# Patient Record
Sex: Female | Born: 1945 | Race: White | Hispanic: No | Marital: Married | Smoking: Former smoker
Health system: Southern US, Community
[De-identification: ages and names within clinical notes are randomized; demographics above are authoritative.]

## PROBLEM LIST (undated history)

## (undated) DIAGNOSIS — F419 Anxiety disorder, unspecified: Secondary | ICD-10-CM

## (undated) DIAGNOSIS — F329 Major depressive disorder, single episode, unspecified: Secondary | ICD-10-CM

## (undated) DIAGNOSIS — M1990 Unspecified osteoarthritis, unspecified site: Secondary | ICD-10-CM

## (undated) DIAGNOSIS — E079 Disorder of thyroid, unspecified: Secondary | ICD-10-CM

## (undated) DIAGNOSIS — C801 Malignant (primary) neoplasm, unspecified: Secondary | ICD-10-CM

## (undated) DIAGNOSIS — I10 Essential (primary) hypertension: Secondary | ICD-10-CM

## (undated) DIAGNOSIS — G459 Transient cerebral ischemic attack, unspecified: Secondary | ICD-10-CM

## (undated) DIAGNOSIS — F32A Depression, unspecified: Secondary | ICD-10-CM

## (undated) DIAGNOSIS — E119 Type 2 diabetes mellitus without complications: Secondary | ICD-10-CM

## (undated) DIAGNOSIS — E785 Hyperlipidemia, unspecified: Secondary | ICD-10-CM

## (undated) HISTORY — PX: ABDOMINAL HYSTERECTOMY: SHX81

## (undated) HISTORY — DX: Type 2 diabetes mellitus without complications: E11.9

## (undated) HISTORY — PX: TUBAL LIGATION: SHX77

## (undated) HISTORY — PX: BREAST BIOPSY: SHX20

---

## 1998-04-14 ENCOUNTER — Encounter: Payer: Self-pay | Admitting: Nephrology

## 1998-04-14 ENCOUNTER — Ambulatory Visit (HOSPITAL_COMMUNITY): Admission: RE | Admit: 1998-04-14 | Discharge: 1998-04-14 | Payer: Self-pay | Admitting: Nephrology

## 1998-05-17 ENCOUNTER — Other Ambulatory Visit: Admission: RE | Admit: 1998-05-17 | Discharge: 1998-05-17 | Payer: Self-pay | Admitting: Family Medicine

## 1999-05-31 ENCOUNTER — Ambulatory Visit (HOSPITAL_COMMUNITY): Admission: RE | Admit: 1999-05-31 | Discharge: 1999-05-31 | Payer: Self-pay | Admitting: Gastroenterology

## 1999-09-28 ENCOUNTER — Other Ambulatory Visit: Admission: RE | Admit: 1999-09-28 | Discharge: 1999-09-28 | Payer: Self-pay | Admitting: Family Medicine

## 2003-01-09 ENCOUNTER — Inpatient Hospital Stay (HOSPITAL_COMMUNITY): Admission: EM | Admit: 2003-01-09 | Discharge: 2003-01-11 | Payer: Self-pay

## 2003-01-09 ENCOUNTER — Encounter: Payer: Self-pay | Admitting: Emergency Medicine

## 2004-07-31 ENCOUNTER — Other Ambulatory Visit: Admission: RE | Admit: 2004-07-31 | Discharge: 2004-07-31 | Payer: Self-pay | Admitting: Family Medicine

## 2007-09-21 ENCOUNTER — Other Ambulatory Visit: Admission: RE | Admit: 2007-09-21 | Discharge: 2007-09-21 | Payer: Self-pay | Admitting: Family Medicine

## 2010-08-03 NOTE — Discharge Summary (Signed)
NAME:  Toni, [name]                       ACCOUNT NO.:  [id number]   MEDICAL RECORD NO.:  [id number]                   PATIENT TYPE:  INP   LOCATION:  4735                                 FACILITY:  [facility]   PHYSICIAN:  Sherin Quarry, MD                   DATE OF BIRTH:  Jan 13, 1946   DATE OF ADMISSION:  01/09/2003  DATE OF DISCHARGE:  01/11/2003                                 DISCHARGE SUMMARY   [name] is a 65 year old lady who initially presented to [facility]  Emergency Room on October 24 with a 24-hour history of recurrent chest pain  described as a raw burning feeling which seemed to radiate to the left arm.  There was no associated dyspnea, diaphoresis, or nausea.  Symptoms were  partially relieved by Maalox.  The patient has a two-year history of  hypertension.  Also of note is that the patient's brother and sister both  had coronary artery disease and were status post CABG.   ADMISSION PHYSICAL EXAMINATION:  VITAL SIGNS:  Blood pressure 139/83, pulse  83, respirations 18.  HEENT:  Within normal limits.  CHEST:  Clear.  CARDIOVASCULAR:  Normal S1 and S2 without rubs, murmurs, or gallops.  ABDOMEN:  Benign.  Normal bowel sounds without masses, tenderness, or  organomegaly.  NEUROLOGIC:  Normal.  EXTREMITIES:  Normal.   HOSPITAL COURSE:  Relevant laboratory studies obtained included white count  8600, hemoglobin 15.2. Differential was normal.  INR 0.9.  Sodium 136,  potassium 3.4, creatinine 0.7, BUN 111.  AST 53, ALT 46.  Serial cardiac  enzymes were negative.  Of note was that the LDL cholesterol was 157, HDL  56, triglycerides 250.  Also of note was that the TSH was 29.39.  At the  time of admission, the patient indicated that she was taking Synthroid 150  mcg daily.   Consultation was obtained from Dr. Fraser Din of the [facility] Cardiology Service.  A stress Cardiolite study was done.  No ischemic changes were identified.  Ejection fraction was felt to be  normal.  Dr. Fraser Din felt this was a normal  study.   On October 26, the patient was discharged.   DISCHARGE DIAGNOSES:  1. Chest pain, not of cardiac origin.  2. Hypertension.  3. Hyperlipidemia.  4. Depression.  5. Hypothyroidism, suspect noncompliance with thyroid medication.  6. Family history of coronary artery disease.  7. History of gastroesophageal reflux disease.   DISCHARGE MEDICATIONS:  1. Synthroid 150 mcg daily.  The patient was encouraged to take this     medication on empty stomach in the morning and to be sure to take it on a     regular basis.  2. She will also continue Prozac 20 mg daily.  3. Wellbutrin 150 mg daily.  4. Prilosec p.r.n.  5. Lipitor 10 mg daily.  6. Chlorthalidone 15 mg daily.   FOLLOW UP:  Robert L. Foy Guadalajara,  M.D. who will continue to follow the patient on  a regular basis.   CONDITION ON DISCHARGE:  Good.                                                Sherin Quarry, MD    SY/MEDQ  D:  02/02/2003  T:  02/03/2003  Job:  [id number]   cc:   Molly Maduro L. Foy Guadalajara, M.D.  [address] [place]  [place] [address]  Fax: [phone]

## 2010-08-03 NOTE — Consult Note (Signed)
NAME:  [name], [name]                       ACCOUNT NO.:  [id number]   MEDICAL RECORD NO.:  [id number]                   PATIENT TYPE:  INP   LOCATION:  4735                                 FACILITY:  [facility]   PHYSICIAN:  Meade Maw, M.D.                 DATE OF BIRTH:  [birth year]   DATE OF CONSULTATION:  DATE OF DISCHARGE:                                   CONSULTATION   REFERRING PHYSICIAN:  Robert L. Foy Guadalajara, M.D.   INDICATION FOR CONSULT:  Chest pain.   HISTORY:  [name] is a pleasant 65 year old female who was admitted on  October 24 with a two day history of chest pain.  The pain was described as  substernal and radiated towards her left arm.  She denies shortness of  breath, nausea, and diaphoresis.  The pain began soon after going to bed  approximately two days ago.  It resolved throughout the night.  She slept  without difficulty.  She awoke the next morning with chest pain.  The chest  pain is not aggravated by activity, actually gets better, but continues to  persist as a vague sort of pain.  The pain also radiates into her left neck.  She presented to the emergency room and was pain-free after her second  sublingual nitroglycerin.  She does note that she has been having frequent  episodes of indigestion.  She has recently been started on Lipitor for  dyslipidemia.   PAST MEDICAL HISTORY:  1. Hypertension.  2. Dyslipidemia.  3. Hypothyroidism.  4. GERD.  5. Depression.   PAST SURGICAL HISTORY:  Total abdominal hysterectomy.   MEDICATIONS:  1. Synthroid 150 mcg daily.  2. Wellbutrin 150 mg daily.  3. Lipitor 10 mg daily.  4. Maalox p.r.n.  5. Prozac 20 mg daily.  6. Prilosec p.r.n.  7. Chlorthalidone 15 mg daily.   ALLERGIES:  She has no known drug allergies.   FAMILY HISTORY:  Father passed at age 25 from COPD.  Brother is alive at 39.  He does have a CABG.  Mother passed at 2.  She suffered from Alzheimer's  before death.   SOCIAL HISTORY:  She  has a remote history of tobacco use.  Stopped smoking  15 years ago.  Has two or three glasses of wine per day.   REVIEW OF SYSTEMS:  There is no shortness of breath, PND, orthopnea.  No  numbness.  No syncope.  No melena.   PHYSICAL EXAMINATION:  GENERAL:  Middle-aged female in no acute distress.  VITAL SIGNS:  Blood pressure 122/79, heart rate 70, respiratory rate 20, O2  saturation 99%.  HEENT:  Unremarkable.  PULMONARY:  Breath sounds are equal and clear to auscultation.  CARDIOVASCULAR:  Regular rate and rhythm.  ABDOMEN:  Soft, nontender.  No unusual bruits or pulsations are noted.  EXTREMITIES:  Distal pulses are bilateral.  NEUROLOGIC:  Nonfocal.  SKIN:  Warm  and dry.   LABORATORY DATA:  ECG reveals a normal sinus rhythm.  There is T-wave  inversion in aVL and V2 only.  Chest x-ray reveals no acute disease.  White  count 8.6, hematocrit 43, platelet count 213,000.  Potassium 3.6, creatinine  1.1, glucose 111.  LFTs are normal.  INR 0.9.  Troponin I serials have been  negative.  Serum CKs are pending.  Serum myoglobins are normal.   IMPRESSION:  98. A 65 year old female with risk factors including remote history of     tobacco use, family history, dyslipidemia.  Chest pain is somewhat     atypical, however, for cardiac.  Will schedule for a stress Cardiolite.     Of note, the patient has had a recent stress Cardiolite within the past     five years which was negative as well.  She will continue with aspirin,     Lovenox, and her beta blockers.  2. Hypertension.  Blood pressure is well controlled.  3. Dyslipidemia.  Initial set of liver enzymes are documented as abnormal.     Second set was normal.  Will repeat to verify.  4. Gastroesophageal reflux disease.  Will continue to PPI.  This may be     aggravated by her relatively large consumption of alcohol.                                               Meade Maw, M.D.    HP/MEDQ  D:  01/10/2003  T:  01/11/2003  Job:   [id number]

## 2010-08-03 NOTE — H&P (Signed)
NAME:  [name], BRIDGE                       ACCOUNT NO.:  [id number]   MEDICAL RECORD NO.:  [id number]                   PATIENT TYPE:  EMS   LOCATION:  MAJO                                 FACILITY:  [facility]   PHYSICIAN:  Sherin Quarry, MD                   DATE OF BIRTH:  [birth year]   DATE OF ADMISSION:  01/09/2003  DATE OF DISCHARGE:                                HISTORY & PHYSICAL   HISTORY OF PRESENT ILLNESS:  The patient is a 65 year old lady who presents  to [facility] emergency room on October 24 with a 24-hour history of  recurrent chest pain.  This began about 7:30 Saturday night, and was  described as  raw or aching feeling in the substernal area which seemed to  radiate across the chest to the proximal portion of the left arm.  It was  not associated with any shortness of breath, diaphoresis or nausea.  The  patient initially took two doses of Maalox with partial relief, and she  managed to fall asleep, although she says she slept somewhat erratically  during the night.  Today, the pain has persisted.  It is not relieved by  antacids.  Therefore, the patient ultimately presented to the emergency  room.   In the emergency room, she was given two sublingual nitroglycerin, again  with partial relief of symptoms which was not clearly related to the  nitroglycerin.  Of note is that the patient has a two-year history of  hypertension for which she is currently taking thiazide diuretic.  Also, she  was recently put on Lipitor for hyperlipidemia.  Both her brother and sister  have coronary artery disease and are status post coronary artery bypass  graft.   PAST MEDICAL HISTORY:   MEDICATIONS:  1. Synthroid 150 micrograms daily.  2. Prozac 20 mg daily.  3. Wellbutrin XL, 150 mg daily.  4. Prilosec over the counter p.r.n.  5. Lipitor 10 mg daily.  6. Chlorthalidone 15 mg daily.   ILLNESSES:  The patient states that she has chronic indigestion which is  attributed to  gastroesophageal reflux.  She had an upper endoscopy about  four years ago, but is not too clear about the results.  She has a  colonoscopy done two years ago which was negative.  The patient uses Maalox  and Prilosec on a p.r.n. basis.  Also of note is the patient indicates that  she was hospitalized for chest pain about five years ago, and had a stress  test at that time which was normal.   OPERATIONS:  She has had  previous hysterectomy and breast biopsy.   FAMILY HISTORY:  The patient's father died of chronic obstructive pulmonary  disease at age 26.  Her mother died at age 53 with Alzheimer's disease.  Her  brother has a history of coronary artery bypass graft at the age of 35.  She  has a sister who had a coronary artery bypass graft at age 32.   SOCIAL HISTORY:  She discontinued cigarette smoking 15 years ago.  She  drinks about two to three glasses of wine per day.  She denies use of drugs.  She is accompanied by her husband who seems to be concerned about her  status.   REVIEW OF SYSTEMS:  Head:  She denies headache or dizziness.  Eyes:  She  denies visional blurring or diplopia.  Ears, nose and throat:  She denies  earache, sinus pain or sore throat.  Chest:  Denies coughing or wheezing,  otherwise see above.  Cardiovascular:  Denies orthopnea, PND or ankle edema,  otherwise see above.  GI:  See above.  GU:  Denies dysuria or urinary  frequency, hesitancy or nocturia.  Rheumatologic:  Denies back pain or joint  pain.  Hematologic:  Denies easy bleeding or bruising.  Neurologic:  Denies  a history of seizure or stroke.   PHYSICAL EXAMINATION:  VITAL SIGNS:  The patient's blood pressure was  initially 172/98.  It then came down to 139/83.  Pulse is 83. Respirations  18.  O2 saturations 99%.  HEENT:  Exam is within normal limits.  There are no carotid bruits.  CHEST:  Clear to auscultation and percussion.  BACK:  Examination of the back revealed no CVA or point tenderness.   CARDIOVASCULAR:  Exam revealed normal S1 and S2 without rubs, murmurs or  gallops.  ABDOMEN:  Within normal limits.  No masses, tenderness or organomegaly.  NEUROLOGIC:  Testing was within normal limits.  EXTREMITIES:  Within normal limits.  No cyanosis or edema.  DP and PT pulses  were intact.   LABORATORY DATA:  EKG:  Normal.   Chest x-ray:  Within normal limits.   White count was 8600, hemoglobin 15.2, troponin was within normal limits.   IMPRESSION:  1. Chest pain, rule out myocardial infarction.  2. Hypertension.  3. Hyperlipidemia.  4. Depression.  5. Hypothyroidism.  6. Family history of coronary artery disease.  7. History of gastroesophageal reflux disease.  8. Status post hysterectomy.   PLAN:  1. The patient has multiple risk factors for coronary disease.  Although the     patient's description would suggest that this pain is not of cardiac     origin, it would be prudent to admit her to rule out myocardial     infarction and possibly to plan a Cardiolite stress subsequently.  2. In the meantime, we will empirically put her on a proton pump inhibitors     in case this is acid reflux.  3.  Of course, we will continue her usual     medicines.                                                Sherin Quarry, MD    SY/MEDQ  D:  01/09/2003  T:  01/10/2003  Job:  [id number]   cc:   ________ ________, MD  [facility]

## 2011-05-14 ENCOUNTER — Ambulatory Visit: Payer: No Typology Code available for payment source | Admitting: Psychology

## 2011-05-16 ENCOUNTER — Ambulatory Visit: Payer: Self-pay | Admitting: Psychology

## 2011-05-23 ENCOUNTER — Ambulatory Visit (INDEPENDENT_AMBULATORY_CARE_PROVIDER_SITE_OTHER): Payer: No Typology Code available for payment source | Admitting: Psychology

## 2011-05-23 DIAGNOSIS — F411 Generalized anxiety disorder: Secondary | ICD-10-CM

## 2011-05-23 DIAGNOSIS — F331 Major depressive disorder, recurrent, moderate: Secondary | ICD-10-CM

## 2011-06-20 ENCOUNTER — Ambulatory Visit (INDEPENDENT_AMBULATORY_CARE_PROVIDER_SITE_OTHER): Payer: No Typology Code available for payment source | Admitting: Psychology

## 2011-06-20 DIAGNOSIS — F331 Major depressive disorder, recurrent, moderate: Secondary | ICD-10-CM

## 2012-05-23 ENCOUNTER — Emergency Department (HOSPITAL_COMMUNITY): Payer: Medicare Other

## 2012-05-23 ENCOUNTER — Encounter (HOSPITAL_COMMUNITY): Payer: Self-pay | Admitting: Emergency Medicine

## 2012-05-23 DIAGNOSIS — R209 Unspecified disturbances of skin sensation: Principal | ICD-10-CM | POA: Insufficient documentation

## 2012-05-23 DIAGNOSIS — F411 Generalized anxiety disorder: Secondary | ICD-10-CM | POA: Insufficient documentation

## 2012-05-23 DIAGNOSIS — I10 Essential (primary) hypertension: Secondary | ICD-10-CM | POA: Insufficient documentation

## 2012-05-23 DIAGNOSIS — E039 Hypothyroidism, unspecified: Secondary | ICD-10-CM | POA: Insufficient documentation

## 2012-05-23 DIAGNOSIS — F1010 Alcohol abuse, uncomplicated: Secondary | ICD-10-CM | POA: Insufficient documentation

## 2012-05-23 DIAGNOSIS — E785 Hyperlipidemia, unspecified: Secondary | ICD-10-CM | POA: Insufficient documentation

## 2012-05-23 DIAGNOSIS — R29898 Other symptoms and signs involving the musculoskeletal system: Secondary | ICD-10-CM | POA: Insufficient documentation

## 2012-05-23 DIAGNOSIS — F329 Major depressive disorder, single episode, unspecified: Secondary | ICD-10-CM | POA: Insufficient documentation

## 2012-05-23 DIAGNOSIS — F3289 Other specified depressive episodes: Secondary | ICD-10-CM | POA: Insufficient documentation

## 2012-05-23 LAB — COMPREHENSIVE METABOLIC PANEL
ALT: 21 U/L (ref 0–35)
AST: 19 U/L (ref 0–37)
Albumin: 4.6 g/dL (ref 3.5–5.2)
Alkaline Phosphatase: 77 U/L (ref 39–117)
BUN: 15 mg/dL (ref 6–23)
CO2: 26 mEq/L (ref 19–32)
Calcium: 10.1 mg/dL (ref 8.4–10.5)
Chloride: 99 mEq/L (ref 96–112)
Creatinine, Ser: 0.95 mg/dL (ref 0.50–1.10)
GFR calc Af Amer: 69 mL/min — ABNORMAL LOW (ref >90)
GFR calc non Af Amer: 60 mL/min — ABNORMAL LOW (ref >90)
Glucose, Bld: 98 mg/dL (ref 70–99)
Potassium: 3.9 mEq/L (ref 3.5–5.1)
Sodium: 137 mEq/L (ref 135–145)
Total Bilirubin: 0.5 mg/dL (ref 0.3–1.2)
Total Protein: 8 g/dL (ref 6.0–8.3)

## 2012-05-23 LAB — CBC WITH DIFFERENTIAL/PLATELET
Basophils Absolute: 0 10*3/uL (ref 0.0–0.1)
Basophils Relative: 0 % (ref 0–1)
Eosinophils Absolute: 0.2 10*3/uL (ref 0.0–0.7)
Eosinophils Relative: 2 % (ref 0–5)
HCT: 44.3 % (ref 36.0–46.0)
Hemoglobin: 15.7 g/dL — ABNORMAL HIGH (ref 12.0–15.0)
Lymphocytes Relative: 38 % (ref 12–46)
Lymphs Abs: 3.3 10*3/uL (ref 0.7–4.0)
MCH: 31.7 pg (ref 26.0–34.0)
MCHC: 35.4 g/dL (ref 30.0–36.0)
MCV: 89.3 fL (ref 78.0–100.0)
Monocytes Absolute: 0.5 10*3/uL (ref 0.1–1.0)
Monocytes Relative: 6 % (ref 3–12)
Neutro Abs: 4.7 10*3/uL (ref 1.7–7.7)
Neutrophils Relative %: 54 % (ref 43–77)
Platelets: 192 10*3/uL (ref 150–400)
RBC: 4.96 MIL/uL (ref 3.87–5.11)
RDW: 13.4 % (ref 11.5–15.5)
WBC: 8.7 10*3/uL (ref 4.0–10.5)

## 2012-05-23 LAB — POCT I-STAT TROPONIN I: Troponin i, poc: 0 ng/mL (ref 0.00–0.08)

## 2012-05-23 NOTE — ED Notes (Signed)
[facility] for eval of numbness to left side of face, neck, and arm-woke up with symptoms. States most likely is due to sleeping in abnormal position but would like to rule out CVA.

## 2012-05-23 NOTE — ED Notes (Signed)
Onset today while laying in bed watching TV sudden onset of left side of face numbness, left arm and left leg numbness at 1100. Seen at an urgent care sent to ED for further evaluation. States numbness has resolved feels better however left side shoulder and arm feels weak. ax4 answering and following commands appropriate. Clear speech bilateral equal strong hand grasps.

## 2012-05-24 ENCOUNTER — Observation Stay (HOSPITAL_COMMUNITY): Payer: Medicare Other

## 2012-05-24 ENCOUNTER — Encounter (HOSPITAL_COMMUNITY): Payer: Self-pay | Admitting: Internal Medicine

## 2012-05-24 ENCOUNTER — Observation Stay (HOSPITAL_COMMUNITY)
Admission: EM | Admit: 2012-05-24 | Discharge: 2012-05-26 | Disposition: A | Discharge status: Home / Self Care | Payer: Medicare Other | Attending: Internal Medicine | Admitting: Internal Medicine

## 2012-05-24 DIAGNOSIS — E785 Hyperlipidemia, unspecified: Secondary | ICD-10-CM | POA: Diagnosis present

## 2012-05-24 DIAGNOSIS — E039 Hypothyroidism, unspecified: Secondary | ICD-10-CM | POA: Diagnosis present

## 2012-05-24 DIAGNOSIS — F329 Major depressive disorder, single episode, unspecified: Secondary | ICD-10-CM | POA: Diagnosis present

## 2012-05-24 DIAGNOSIS — I10 Essential (primary) hypertension: Secondary | ICD-10-CM | POA: Diagnosis present

## 2012-05-24 DIAGNOSIS — G459 Transient cerebral ischemic attack, unspecified: Secondary | ICD-10-CM

## 2012-05-24 DIAGNOSIS — F32A Depression, unspecified: Secondary | ICD-10-CM | POA: Diagnosis present

## 2012-05-24 DIAGNOSIS — F1010 Alcohol abuse, uncomplicated: Secondary | ICD-10-CM | POA: Diagnosis present

## 2012-05-24 DIAGNOSIS — R202 Paresthesia of skin: Secondary | ICD-10-CM

## 2012-05-24 DIAGNOSIS — R200 Anesthesia of skin: Secondary | ICD-10-CM | POA: Diagnosis present

## 2012-05-24 DIAGNOSIS — F419 Anxiety disorder, unspecified: Secondary | ICD-10-CM | POA: Diagnosis present

## 2012-05-24 DIAGNOSIS — E782 Mixed hyperlipidemia: Secondary | ICD-10-CM | POA: Diagnosis present

## 2012-05-24 HISTORY — DX: Depression, unspecified: F32.A

## 2012-05-24 HISTORY — DX: Major depressive disorder, single episode, unspecified: F32.9

## 2012-05-24 HISTORY — DX: Anxiety disorder, unspecified: F41.9

## 2012-05-24 HISTORY — DX: Hyperlipidemia, unspecified: E78.5

## 2012-05-24 HISTORY — DX: Disorder of thyroid, unspecified: E07.9

## 2012-05-24 HISTORY — DX: Essential (primary) hypertension: I10

## 2012-05-24 LAB — RAPID URINE DRUG SCREEN, HOSP PERFORMED: Opiates: NOT DETECTED

## 2012-05-24 MED ORDER — ASPIRIN 325 MG PO TABS
325.0000 mg | ORAL_TABLET | Freq: Every day | ORAL | Status: DC
  Administered 2012-05-25 – 2012-05-26 (×2): 325 mg via ORAL
  Filled 2012-05-24 (×2): qty 1

## 2012-05-24 MED ORDER — LEVOTHYROXINE SODIUM 100 MCG PO TABS
100.0000 ug | ORAL_TABLET | Freq: Every day | ORAL | Status: DC
  Administered 2012-05-24 – 2012-05-26 (×3): 100 ug via ORAL
  Filled 2012-05-24 (×3): qty 1

## 2012-05-24 MED ORDER — ASPIRIN 81 MG PO CHEW
324.0000 mg | CHEWABLE_TABLET | Freq: Once | ORAL | Status: AC
  Administered 2012-05-24: 324 mg via ORAL
  Filled 2012-05-24: qty 4

## 2012-05-24 MED ORDER — ENOXAPARIN SODIUM 40 MG/0.4ML ~~LOC~~ SOLN
40.0000 mg | SUBCUTANEOUS | Status: DC
  Administered 2012-05-24 – 2012-05-25 (×2): 40 mg via SUBCUTANEOUS
  Filled 2012-05-24 (×3): qty 0.4

## 2012-05-24 MED ORDER — LORAZEPAM 1 MG PO TABS: 1.0000 mg | ORAL_TABLET | Freq: Four times a day (QID) | ORAL | Status: DC | PRN

## 2012-05-24 MED ORDER — SIMVASTATIN 10 MG PO TABS
10.0000 mg | ORAL_TABLET | Freq: Every evening | ORAL | Status: DC
  Administered 2012-05-24 – 2012-05-25 (×2): 10 mg via ORAL
  Filled 2012-05-24 (×3): qty 1

## 2012-05-24 MED ORDER — THIAMINE HCL 100 MG/ML IJ SOLN
100.0000 mg | Freq: Every day | INTRAMUSCULAR | Status: DC
  Filled 2012-05-24 (×3): qty 1

## 2012-05-24 MED ORDER — ACETAMINOPHEN 325 MG PO TABS: 650.0000 mg | ORAL_TABLET | ORAL | Status: DC | PRN

## 2012-05-24 MED ORDER — LORAZEPAM 2 MG/ML IJ SOLN: 1.0000 mg | Freq: Four times a day (QID) | INTRAMUSCULAR | Status: DC | PRN

## 2012-05-24 MED ORDER — SODIUM CHLORIDE 0.9 % IV SOLN
INTRAVENOUS | Status: AC
  Administered 2012-05-24: 13:00:00 via INTRAVENOUS

## 2012-05-24 MED ORDER — FLUOXETINE HCL 10 MG PO CAPS
10.0000 mg | ORAL_CAPSULE | Freq: Every day | ORAL | Status: DC
  Administered 2012-05-24 – 2012-05-26 (×3): 10 mg via ORAL
  Filled 2012-05-24 (×3): qty 1

## 2012-05-24 MED ORDER — FOLIC ACID 1 MG PO TABS
1.0000 mg | ORAL_TABLET | Freq: Every day | ORAL | Status: DC
  Filled 2012-05-24 (×3): qty 1

## 2012-05-24 MED ORDER — ADULT MULTIVITAMIN W/MINERALS CH
1.0000 | ORAL_TABLET | Freq: Every day | ORAL | Status: DC
  Filled 2012-05-24 (×3): qty 1

## 2012-05-24 MED ORDER — AMLODIPINE BESYLATE 10 MG PO TABS
10.0000 mg | ORAL_TABLET | Freq: Every day | ORAL | Status: DC
  Administered 2012-05-24 – 2012-05-25 (×2): 10 mg via ORAL
  Filled 2012-05-24 (×4): qty 1

## 2012-05-24 MED ORDER — VITAMIN B-1 100 MG PO TABS
100.0000 mg | ORAL_TABLET | Freq: Every day | ORAL | Status: DC
  Filled 2012-05-24 (×3): qty 1

## 2012-05-24 MED ORDER — ALPRAZOLAM 0.25 MG PO TABS
0.1250 mg | ORAL_TABLET | Freq: Three times a day (TID) | ORAL | Status: DC | PRN
  Administered 2012-05-24 – 2012-05-25 (×2): 0.125 mg via ORAL
  Filled 2012-05-24 (×2): qty 1

## 2012-05-24 NOTE — Progress Notes (Signed)
Flow manager called and stated that patient would be placed first of the list for the day shift hospitalist. Patient informed. Breakfast ordered for patient.

## 2012-05-24 NOTE — ED Notes (Signed)
Family at bedside. 

## 2012-05-24 NOTE — H&P (Addendum)
Patient's PCP: Lenora Boys, MD  Chief Complaint: Left face and arm numbness  History of Present Illness: [name] is a 67 y.o. Caucasian female with history of hypertension, hypothyroidism, hyperlipidemia, depression and anxiety who presents with the above complaints.  Patient reports that she has been under stress recently with family issues, she indicated that her daughter has recently had a heart attack, husband may have had a seizure, and has been living with a family member.  She noted that yesterday at 11 a.m. she was watching television and had numbness on the left side of the face and arm.  She had initially presented to urgent care but was referred to the emergency department.  She was transferred from [facility] for further care and evaluation.  Has not had any recent fevers, chills, nausea or vomiting.  Does complain of chest pain but attributes that to her GERD, currently does does not complaining of any chest pain.  Denies any abdominal pain, diarrhea, or vision changes.  Complaining of left-sided headache.  Review of Systems: All systems reviewed with the patient and positive as per history of present illness, otherwise all other systems are negative.  Past Medical History  Diagnosis Date  . Hypertension   . Thyroid disease   . Hyperlipemia   . Depression   . Anxiety    History reviewed. No pertinent past surgical history. Family History  Problem Relation Age of Onset  . Diabetes Mother   . Dementia Mother   . COPD Father   . Heart attack Daughter    History   Social History  . Marital Status: Married    Spouse Name: N/A    Number of Children: N/A  . Years of Education: N/A   Occupational History  . Not on file.   Social History Main Topics  . Smoking status: Former Smoker    Quit date: 05/25/1987  . Smokeless tobacco: Not on file  . Alcohol Use: Yes     Comment: 3 glasses of wine daily  . Drug Use: No  . Sexually Active: Not on file    Other Topics Concern  . Not on file   Social History Narrative  . No narrative on file   Allergies: Penicillins  Home Meds: Prior to Admission medications   Medication Sig Start Date End Date Taking? Authorizing Provider  ALPRAZolam Prudy Feeler) 0.5 MG tablet Take 0.125 mg by mouth 3 (three) times daily as needed for anxiety.   Yes Historical Provider, MD  amLODipine (NORVASC) 10 MG tablet Take 10 mg by mouth at bedtime.   Yes Historical Provider, MD  FLUoxetine (PROZAC) 10 MG capsule Take 10 mg by mouth daily.   Yes Historical Provider, MD  levothyroxine (SYNTHROID, LEVOTHROID) 100 MCG tablet Take 100 mcg by mouth daily.   Yes Historical Provider, MD  simvastatin (ZOCOR) 20 MG tablet Take 10 mg by mouth every evening.   Yes Historical Provider, MD    Physical Exam: Blood pressure 132/72, pulse 75, temperature 97.8 F (36.6 C), temperature source Oral, resp. rate 18, height 5\' 5"  (1.651 m), weight 83.1 kg (183 lb 3.2 oz), SpO2 98.00%. General: Awake, Oriented x3, No acute distress. HEENT: EOMI, Moist mucous membranes Neck: Supple CV: S1 and S2 Lungs: Clear to ascultation bilaterally Abdomen: Soft, Nontender, Nondistended, +bowel sounds. Ext: Good pulses. Trace edema. No clubbing or cyanosis noted. Neuro: Cranial Nerves II-XII grossly intact. Has 5/5 motor strength in upper and lower extremities.  Denies any altered sensation/paresthesia  on the left side of the face to touch.  Lab results:  Recent Labs  05/23/12 1754  NA 137  K 3.9  CL 99  CO2 26  GLUCOSE 98  BUN 15  CREATININE 0.95  CALCIUM 10.1    Recent Labs  05/23/12 1754  AST 19  ALT 21  ALKPHOS 77  BILITOT 0.5  PROT 8.0  ALBUMIN 4.6   No results found for this basename: LIPASE, AMYLASE,  in the last 72 hours  Recent Labs  05/23/12 1754  WBC 8.7  NEUTROABS 4.7  HGB 15.7*  HCT 44.3  MCV 89.3  PLT 192   No results found for this basename: CKTOTAL, CKMB, CKMBINDEX, TROPONINI,  in the last 72  hours No components found with this basename: POCBNP,  No results found for this basename: DDIMER,  in the last 72 hours No results found for this basename: HGBA1C,  in the last 72 hours No results found for this basename: CHOL, HDL, LDLCALC, TRIG, CHOLHDL, LDLDIRECT,  in the last 72 hours No results found for this basename: TSH, T4TOTAL, FREET3, T3FREE, THYROIDAB,  in the last 72 hours No results found for this basename: VITAMINB12, FOLATE, FERRITIN, TIBC, IRON, RETICCTPCT,  in the last 72 hours Imaging results:  Dg Chest 2 View  05/23/2012  *RADIOLOGY REPORT*  Clinical Data: Numbness  CHEST - 2 VIEW  Comparison: None.  Findings: The lungs are well-aerated and free from pulmonary edema, focal airspace consolidation or pulmonary nodule.  Cardiac and mediastinal contours are within normal limits.  No pneumothorax, or pleural effusion. No acute osseous findings.  IMPRESSION:  No acute cardiopulmonary disease.   Original Report Authenticated By: Malachy Moan, M.D.    Ct Head Wo Contrast  05/23/2012  *RADIOLOGY REPORT*  Clinical Data: Left-sided facial, neck and arm numbness.  CT HEAD WITHOUT CONTRAST  Technique:  Contiguous axial images were obtained from the base of the skull through the vertex without contrast.  Comparison: None.  Findings: There is no evidence of acute infarction, mass lesion, or intra- or extra-axial hemorrhage on CT.  Scattered periventricular white matter change likely reflects small vessel ischemic microangiopathy.  The posterior fossa, including the cerebellum, brainstem and fourth ventricle, is within normal limits.  The third and lateral ventricles, and basal ganglia are unremarkable in appearance.  The cerebral hemispheres are symmetric in appearance, with normal gray- white differentiation.  No mass effect or midline shift is seen.  There is no evidence of fracture; visualized osseous structures are unremarkable in appearance.  The orbits are within normal limits. The  paranasal sinuses and mastoid air cells are well-aerated.  No significant soft tissue abnormalities are seen.  IMPRESSION:  1.  No acute intracranial pathology seen on CT.  However, CT is generally insensitive for very acute infarct. 2.  Scattered small vessel ischemic microangiopathy.   Original Report Authenticated By: Tonia Ghent, M.D.    Other results: EKG: Normal sinus rhythm with heart rate of 85.  Assessment & Plan by Problem: Numbness/altered sensation of left face and arm Etiology unclear.  Even though patient still notes the altered sensation is present, it is not evident on exam to tactile sensation.  Initiate TIA protocol, will do MRI/MRA of the brain, 2-D echocardiogram, and carotid Dopplers.  Will request PT/OT evaluation.  Suspect this may be driven by anxiety and alcohol consumption.  Check a lipid panel in the morning.  Check hemoglobin A1c.  Continue aspirin.  Alcohol use Start CIWA protocol.  Patient drinks 3  glasses of wine daily.  Initiate thiamine and folic acid.  Patient recognizes that this is a problem for her and is agreeable to meeting with social worker for resources for cessation at the time of discharge.  Hypertension Stable.  Continue home antihypertensive medication.  Hypothyroidism Continue levothyroxine.  Hyperlipidemia Continue statin.  Depression/anxiety Continue her medication.  Suspect some of the anxiety may be triggering her above symptoms.  Prophylaxis Lovenox.  CODE STATUS Full code.  Disposition Admit the patient as observation to telemetry.  Time spent on admission, talking to the patient, and coordinating care was: 60 mins.  REDDY,SRIKAR A, MD 05/24/2012, 8:58 AM

## 2012-05-24 NOTE — ED Provider Notes (Signed)
History     CSN: [id number]  Arrival date & time 05/23/12  1707   None     Chief Complaint  Patient presents with  . Numbness    (Consider location/radiation/quality/duration/timing/severity/associated sxs/prior treatment) HPI [name] is a 68 y.o. female w/ PMH of HLD and HTN presenting with left sided weakness and numbness that lasted for over an hour starting about 1100 this morning.  This gradually resolved. She denies dysarthria, headache, neck pain, fevers or chills.  She and husband deny facial droop at the time, though she did not tell her husband about her symptoms at the time. Her left upper extremity still feels mildly weak. Denies chest pain, shortness of breath, palpitations. H/O also of anxiety and depression but has not had symptoms of anxiety inclduing tremor, rapid breathing, rapid heart beat.  Past Medical History  Diagnosis Date  . Hypertension   . Thyroid disease   . Hyperlipemia   . Depression   . Anxiety     History reviewed. No pertinent past surgical history.  No family history on file.  History  Substance Use Topics  . Smoking status: Never Smoker   . Smokeless tobacco: Not on file  . Alcohol Use: No    OB History   Grav Para Term Preterm Abortions TAB SAB Ect Mult Living                  Review of Systems At least 10pt or greater review of systems completed and are negative except where specified in the HPI.  Allergies  Penicillins  Home Medications  No current outpatient prescriptions on file.  BP 130/74  Pulse 80  Temp(Src) 98.2 F (36.8 C) (Oral)  Resp 12  Ht 5\' 5"  (1.651 m)  Wt 186 lb (84.369 kg)  BMI 30.95 kg/m2  SpO2 97%  Physical Exam  PHYSICAL EXAM: VITAL SIGNS:  . Filed Vitals:   05/25/12 2218 05/25/12 2245 05/26/12 0151 05/26/12 0617  BP: 130/80 142/71 122/63 113/71  Pulse:  89 81 84  Temp:  97.9 F (36.6 C) 97.8 F (36.6 C) 98.1 F (36.7 C)  TempSrc:  Oral Oral Oral  Resp:  16 15 16   Height:       Weight:      SpO2:  98% 100% 98%   CONSTITUTIONAL: Awake, oriented, appears non-toxic HENT: Atraumatic, normocephalic, oral mucosa pink and moist, airway patent. Nares patent without drainage. External ears normal. EYES: Conjunctiva clear, EOMI, PERRLA NECK: Trachea midline, non-tender, supple CARDIOVASCULAR: Normal heart rate, Normal rhythm  PULMONARY/CHEST: Clear to auscultation, no rhonchi, wheezes, or rales. Symmetrical breath sounds. CHEST WALL: No lesions. Non-tender. ABDOMINAL: Non-distended, soft, non-tender - no rebound or guarding.  BS normal. NEUROLOGIC: NW:GNFAOZ fields intact. PERRLA, EOMI.  Facial sensation equal to light touch bilaterally.  Good muscle bulk in the masseter muscle and good lateral movement of the jaw.  Facial expressions equal and good strength with smile/frown and puffed cheeks.  Hearing grossly intact to finger rub test.  Uvula, tongue are midline with no deviation. Symmetrical palate elevation.  Trapezius and SCM muscles are 5/5 strength bilaterally.   DTR: Brachioradialis, biceps, patellar, Achilles tendon reflexes 2+ bilaterally.  No clonus. Strength: 5/5 strength flexors and extensors in the upper and lower extremities.  Grip strength, finger adduction/abduction 5/5. Sensation: Sensation intact distally to light touch  Cerebellar: No ataxia with walking or dysmetria with finger to nose, rapid alternating hand movements and heels to shin testing. EXTREMITIES: No clubbing, cyanosis, or edema  SKIN: Warm, Dry, No erythema, No rash   ED Course  Procedures (including critical care time)  Date: 05/28/2012  Rate: 85  Rhythm: normal sinus rhythm  QRS Axis: normal  Intervals: normal  ST/T Wave abnormalities: normal  Conduction Disutrbances: none  Narrative Interpretation: unremarkable, NSR  Labs Reviewed  CBC WITH DIFFERENTIAL - Abnormal; Notable for the following:    Hemoglobin 15.7 (*)    All other components within normal limits  COMPREHENSIVE  METABOLIC PANEL - Abnormal; Notable for the following:    GFR calc non Af Amer 60 (*)    GFR calc Af Amer 69 (*)    All other components within normal limits  HEMOGLOBIN A1C - Abnormal; Notable for the following:    Hemoglobin A1C 6.4 (*)    Mean Plasma Glucose 137 (*)    All other components within normal limits  CBC - Abnormal; Notable for the following:    Hemoglobin 15.2 (*)    All other components within normal limits  BASIC METABOLIC PANEL - Abnormal; Notable for the following:    Glucose, Bld 113 (*)    GFR calc non Af Amer 65 (*)    GFR calc Af Amer 75 (*)    All other components within normal limits  LIPID PANEL - Abnormal; Notable for the following:    Cholesterol 221 (*)    LDL Cholesterol 138 (*)    All other components within normal limits  URINE RAPID DRUG SCREEN (HOSP PERFORMED)  POCT I-STAT TROPONIN I   Dg Chest 2 View  05/23/2012  *RADIOLOGY REPORT*  Clinical Data: Numbness  CHEST - 2 VIEW  Comparison: None.  Findings: The lungs are well-aerated and free from pulmonary edema, focal airspace consolidation or pulmonary nodule.  Cardiac and mediastinal contours are within normal limits.  No pneumothorax, or pleural effusion. No acute osseous findings.  IMPRESSION:  No acute cardiopulmonary disease.   Original Report Authenticated By: Malachy Moan, M.D.    Ct Head Wo Contrast  05/23/2012  *RADIOLOGY REPORT*  Clinical Data: Left-sided facial, neck and arm numbness.  CT HEAD WITHOUT CONTRAST  Technique:  Contiguous axial images were obtained from the base of the skull through the vertex without contrast.  Comparison: None.  Findings: There is no evidence of acute infarction, mass lesion, or intra- or extra-axial hemorrhage on CT.  Scattered periventricular white matter change likely reflects small vessel ischemic microangiopathy.  The posterior fossa, including the cerebellum, brainstem and fourth ventricle, is within normal limits.  The third and lateral ventricles, and basal  ganglia are unremarkable in appearance.  The cerebral hemispheres are symmetric in appearance, with normal gray- white differentiation.  No mass effect or midline shift is seen.  There is no evidence of fracture; visualized osseous structures are unremarkable in appearance.  The orbits are within normal limits. The paranasal sinuses and mastoid air cells are well-aerated.  No significant soft tissue abnormalities are seen.  IMPRESSION:  1.  No acute intracranial pathology seen on CT.  However, CT is generally insensitive for very acute infarct. 2.  Scattered small vessel ischemic microangiopathy.   Original Report Authenticated By: Tonia Ghent, M.D.      1. TIA (transient ischemic attack)   2. Numbness and tingling in left arm   3. Numbness of face   4. Alcohol abuse, daily use   5. Anxiety   6. Depression       MDM  [name] is a 67 y.o. female presents with history c/w  and concerning for TIA.  Differential includes mild CVA, TIA, atypical migraine, malnutrition, vitamin deficiency, EtOH/Drugs, hypothyroidism, anxiety and depression.  Labs and CT of head obtained.  I reviewed the lab results and looked at the CT - I agree with radiologist there is no acute bleed, concern with small vessel ischemic microangiopathy for TIAs leading to CVA.  Pt needs further risk stratification.  ABCD2 score of 5 putting her at moderate risk of CVA - will admit for further risk stratification.  D/W [facility] for admission        Jones Skene, MD 05/28/12 [phone]

## 2012-05-24 NOTE — Progress Notes (Signed)
Patient arrived to floor. Only temporary ED orders were placed. MD paged to come assess patient. Patient placed on telemetry per protocol and oriented to room. Awaiting orders at this time.

## 2012-05-24 NOTE — Progress Notes (Signed)
VASCULAR LAB PRELIMINARY  PRELIMINARY  PRELIMINARY  PRELIMINARY  Carotid Dopplers completed.    Preliminary report:  No ICA stenosis.  Vertebral artery flow is antegrade.  KANADY, CANDACE, RVT 05/24/2012, 11:12 AM

## 2012-05-24 NOTE — Progress Notes (Signed)
UR Completed Brenda Graves-Bigelow, RN,BSN [phone]  

## 2012-05-25 DIAGNOSIS — F1010 Alcohol abuse, uncomplicated: Secondary | ICD-10-CM

## 2012-05-25 DIAGNOSIS — F411 Generalized anxiety disorder: Secondary | ICD-10-CM

## 2012-05-25 DIAGNOSIS — F3289 Other specified depressive episodes: Secondary | ICD-10-CM

## 2012-05-25 DIAGNOSIS — F329 Major depressive disorder, single episode, unspecified: Secondary | ICD-10-CM

## 2012-05-25 LAB — CBC
HCT: 44.5 % (ref 36.0–46.0)
Hemoglobin: 15.2 g/dL — ABNORMAL HIGH (ref 12.0–15.0)
MCH: 31.9 pg (ref 26.0–34.0)
MCV: 93.3 fL (ref 78.0–100.0)
RBC: 4.77 MIL/uL (ref 3.87–5.11)
WBC: 8.7 10*3/uL (ref 4.0–10.5)

## 2012-05-25 LAB — HEMOGLOBIN A1C
Hgb A1c MFr Bld: 6.4 % — ABNORMAL HIGH (ref <5.7)
Mean Plasma Glucose: 137 mg/dL — ABNORMAL HIGH (ref <117)

## 2012-05-25 LAB — LIPID PANEL
LDL Cholesterol: 138 mg/dL — ABNORMAL HIGH (ref 0–99)
Total CHOL/HDL Ratio: 3.8 RATIO
VLDL: 25 mg/dL (ref 0–40)

## 2012-05-25 LAB — BASIC METABOLIC PANEL
CO2: 24 mEq/L (ref 19–32)
Calcium: 9.2 mg/dL (ref 8.4–10.5)
Chloride: 105 mEq/L (ref 96–112)
Creatinine, Ser: 0.89 mg/dL (ref 0.50–1.10)
Glucose, Bld: 113 mg/dL — ABNORMAL HIGH (ref 70–99)

## 2012-05-25 NOTE — Evaluation (Signed)
Occupational Therapy Evaluation Patient Details Name: [name] MRN: [id number] DOB: 10-10-1945 Today's Date: 05/25/2012 Time: 4132-4401 OT Time Calculation (min): 10 min  OT Assessment / Plan / Recommendation Clinical Impression  Pt admitted for observation with numbness in her face and L arm which has now resolved.  MRI negative for infarct, but cervical stenosis found.  Pt's symptoms have resolved and she has returned to her baseline in ADL and mobility.  No further OT needs.    OT Assessment  Patient does not need any further OT services    Follow Up Recommendations  No OT follow up    Barriers to Discharge      Equipment Recommendations  None recommended by OT    Recommendations for Other Services    Frequency       Precautions / Restrictions Precautions Precautions: None Restrictions Weight Bearing Restrictions: No   Pertinent Vitals/Pain No pain reported    ADL  Eating/Feeding: Independent Where Assessed - Eating/Feeding: Chair Grooming: Wash/dry hands;Brushing hair;Independent Where Assessed - Grooming: Unsupported standing Upper Body Bathing: Independent Where Assessed - Upper Body Bathing: Unsupported standing Lower Body Bathing: Independent Where Assessed - Lower Body Bathing: Unsupported standing Upper Body Dressing: Independent Where Assessed - Upper Body Dressing: Unsupported sitting Lower Body Dressing: Independent Where Assessed - Lower Body Dressing: Unsupported sitting;Unsupported standing Toilet Transfer: Community education officer Method: Sit to Barista: Comfort height toilet Toileting - Architect and Hygiene: Independent Where Assessed - Engineer, mining and Hygiene: Standing Tub/Shower Transfer: Designer, industrial/product Method: Ambulating Transfers/Ambulation Related to ADLs: Independent with mobility.    OT Diagnosis:    OT Problem List:   OT Treatment Interventions:      OT Goals    Visit Information  Last OT Received On: 05/25/12 Assistance Needed: +1    Subjective Data  Subjective: "I'm doing everything for myself." Patient Stated Goal: Return home.   Prior Functioning     Home Living Lives With: Spouse Available Help at Discharge: Family;Available 24 hours/day Type of Home: House Home Access: Stairs to enter [employer] of Steps: 2 Entrance Stairs-Rails: None Home Layout: One level Bathroom Shower/Tub: Engineer, manufacturing systems: Standard Bathroom Accessibility: Yes How Accessible: Accessible via walker Home Adaptive Equipment: None Prior Function Level of Independence: Independent Able to Take Stairs?: Yes Driving: Yes Vocation: Retired Musician: No difficulties Dominant Hand: Right         Vision/Perception Vision - History Patient Visual Report: No change from baseline   Cognition  Cognition Overall Cognitive Status: Appears within functional limits for tasks assessed/performed Arousal/Alertness: Awake/alert Orientation Level: Appears intact for tasks assessed;Oriented X4 / Intact Behavior During Session: [facility] for tasks performed    Extremity/Trunk Assessment Right Upper Extremity Assessment RUE ROM/Strength/Tone: Within functional levels RUE Sensation: WFL - Light Touch RUE Coordination: WFL - gross/fine motor Left Upper Extremity Assessment LUE ROM/Strength/Tone: Within functional levels LUE Sensation: WFL - Light Touch LUE Coordination: WFL - gross/fine motor Trunk Assessment Trunk Assessment: Normal     Mobility Bed Mobility Bed Mobility: Not assessed Transfers Transfers: Sit to Stand;Stand to Sit Sit to Stand: 7: Independent;With upper extremity assist;From chair/3-in-1;From toilet Stand to Sit: 7: Independent;With upper extremity assist;To chair/3-in-1;To toilet Details for Transfer Assistance: safe technique     Exercise     Balance   End of Session OT - End  of Session Activity Tolerance: Patient tolerated treatment well Patient left: in chair;with call bell/phone within reach  GO Functional Assessment Tool Used:  clinical judgment Functional Limitation: Self care Self Care Current Status [phone]): 0 percent impaired, limited or restricted Self Care Goal Status (U0454): 0 percent impaired, limited or restricted Self Care Discharge Status [phone]): 0 percent impaired, limited or restricted   Evern Bio 05/25/2012, 11:19 AM [phone]

## 2012-05-25 NOTE — Progress Notes (Signed)
[facility] PROGRESS NOTE  JAQUELYNE FIRKUS UJW:[id number] DOB: 04-07-1945 DOA: 05/24/2012 PCP: Lenora Boys, MD  Assessment/Plan: Numbness/altered sensation of left face and arm  Etiology unclear. Even though patient still notes the altered sensation is present, it is not evident on exam to tactile sensation. Initiate TIA protocol, will do MRI/MRA of the brain, 2-D echocardiogram, and carotid Dopplers. Will request PT/OT evaluation. Suspect this may be driven by anxiety and alcohol consumption. Check a lipid panel in the morning. Check hemoglobin A1c. Continue aspirin.  Alcohol use  Start CIWA protocol. Patient drinks 3 glasses of wine daily. Initiate thiamine and folic acid. Patient recognizes that this is a problem for her and is agreeable to meeting with social worker for resources for cessation at the time of discharge.  Hypertension  Stable. Continue home antihypertensive medication.  Hypothyroidism  Continue levothyroxine.  Hyperlipidemia  Continue statin.  Depression/anxiety  Continue her medication. Suspect some of the anxiety may be triggering her above symptoms.  Prophylaxis  Lovenox.  CODE STATUS  Full code.  Objective: Filed Vitals:   05/25/12 0600 05/25/12 1001 05/25/12 1418 05/25/12 1801  BP: 100/64 134/75 127/71 135/75  Pulse: 78 88 82 77  Temp: 97.5 F (36.4 C) 98.5 F (36.9 C) 98.5 F (36.9 C) 98.1 F (36.7 C)  TempSrc: Oral Oral Oral Oral  Resp: 18 20 18 18   Height:      Weight:      SpO2: 96% 99% 99% 100%    Intake/Output Summary (Last 24 hours) at 05/25/12 1935 Last data filed at 05/25/12 0500  Gross per 24 hour  Intake 804.17 ml  Output      0 ml  Net 804.17 ml   Filed Weights   05/23/12 1730 05/24/12 0528  Weight: 84.369 kg (186 lb) 83.1 kg (183 lb 3.2 oz)    Exam: General: Awake, Oriented x3, No acute distress.  HEENT: EOMI, Moist mucous membranes  Neck: Supple  CV: S1 and S2  Lungs: Clear to ascultation bilaterally  Abdomen:  Soft, Nontender, Nondistended, +bowel sounds.  Ext: Good pulses. Trace edema. No clubbing or cyanosis noted.  Neuro: Cranial Nerves II-XII grossly intact. Has 5/5 motor strength in upper and lower extremities. Denies any altered sensation/paresthesia on the left side of the face to touch   Data Reviewed: Basic Metabolic Panel:  Recent Labs Lab 05/23/12 1754 05/25/12 0755  NA 137 142  K 3.9 4.1  CL 99 105  CO2 26 24  GLUCOSE 98 113*  BUN 15 13  CREATININE 0.95 0.89  CALCIUM 10.1 9.2   Liver Function Tests:  Recent Labs Lab 05/23/12 1754  AST 19  ALT 21  ALKPHOS 77  BILITOT 0.5  PROT 8.0  ALBUMIN 4.6   No results found for this basename: LIPASE, AMYLASE,  in the last 168 hours No results found for this basename: AMMONIA,  in the last 168 hours CBC:  Recent Labs Lab 05/23/12 1754 05/25/12 0755  WBC 8.7 8.7  NEUTROABS 4.7  --   HGB 15.7* 15.2*  HCT 44.3 44.5  MCV 89.3 93.3  PLT 192 188   Cardiac Enzymes: No results found for this basename: CKTOTAL, CKMB, CKMBINDEX, TROPONINI,  in the last 168 hours BNP (last 3 results) No results found for this basename: PROBNP,  in the last 8760 hours CBG: No results found for this basename: GLUCAP,  in the last 168 hours  No results found for this or any previous visit (from the past 240 hour(s)).  Studies: Dg Chest 2 View  05/23/2012  *RADIOLOGY REPORT*  Clinical Data: Numbness  CHEST - 2 VIEW  Comparison: None.  Findings: The lungs are well-aerated and free from pulmonary edema, focal airspace consolidation or pulmonary nodule.  Cardiac and mediastinal contours are within normal limits.  No pneumothorax, or pleural effusion. No acute osseous findings.  IMPRESSION:  No acute cardiopulmonary disease.   Original Report Authenticated By: Malachy Moan, M.D.    Ct Head Wo Contrast  05/23/2012  *RADIOLOGY REPORT*  Clinical Data: Left-sided facial, neck and arm numbness.  CT HEAD WITHOUT CONTRAST  Technique:  Contiguous axial  images were obtained from the base of the skull through the vertex without contrast.  Comparison: None.  Findings: There is no evidence of acute infarction, mass lesion, or intra- or extra-axial hemorrhage on CT.  Scattered periventricular white matter change likely reflects small vessel ischemic microangiopathy.  The posterior fossa, including the cerebellum, brainstem and fourth ventricle, is within normal limits.  The third and lateral ventricles, and basal ganglia are unremarkable in appearance.  The cerebral hemispheres are symmetric in appearance, with normal gray- white differentiation.  No mass effect or midline shift is seen.  There is no evidence of fracture; visualized osseous structures are unremarkable in appearance.  The orbits are within normal limits. The paranasal sinuses and mastoid air cells are well-aerated.  No significant soft tissue abnormalities are seen.  IMPRESSION:  1.  No acute intracranial pathology seen on CT.  However, CT is generally insensitive for very acute infarct. 2.  Scattered small vessel ischemic microangiopathy.   Original Report Authenticated By: Tonia Ghent, M.D.    Mri Brain Without Contrast  05/24/2012  *RADIOLOGY REPORT*  Clinical Data:  TIA.  Left face and arm numbness  MRI HEAD WITHOUT CONTRAST MRA HEAD WITHOUT CONTRAST  Technique:  Multiplanar, multiecho pulse sequences of the brain and surrounding structures were obtained without intravenous contrast. Angiographic images of the head were obtained using MRA technique without contrast.  Comparison:  CT 05/23/2012  MRI HEAD  Findings:  Negative for acute infarct.  Scattered white matter hyperintensities bilaterally, most consistent with chronic microvascular ischemia.  Brainstem and cerebellum are intact.  Ventricle size is normal.  Negative for intracranial hemorrhage or mass lesion.  Paranasal sinuses are clear.  IMPRESSION: Mild chronic microvascular ischemia in the white matter.  No acute infarct.  MRA HEAD   Findings: Both vertebral arteries are patent to the basilar.  Right vertebral is dominant.  The basilar is widely patent.  Superior cerebellar and posterior cerebral arteries are patent bilaterally. Fetal origin of the left posterior cerebral artery.  Cavernous carotid is widely patent bilaterally without stenosis. Both anterior and middle cerebral arteries are patent without stenosis.  Negative for cerebral aneurysm.  IMPRESSION: Negative   Original Report Authenticated By: Janeece Riggers, M.D.    Mr Cervical Spine Wo Contrast  05/24/2012  *RADIOLOGY REPORT*  Clinical Data: Left face and arm numbness  MRI CERVICAL SPINE WITHOUT CONTRAST  Technique:  Multiplanar and multiecho pulse sequences of the cervical spine, to include the craniocervical junction and cervicothoracic junction, were obtained according to standard protocol without intravenous contrast.  Comparison: None.  Findings: Normal cervical alignment.  Negative for fracture or mass.  Spinal cord signal is normal.  No cord compression.  C2-3:  Negative  C3-4:  Mild disc degeneration with central disc and osteophyte flattening the cord.  Mild spinal stenosis.  Left foraminal narrowing due to spurring.  C4-5:  Disc degeneration and  spondylosis with diffuse uncinate spurring.  There is cord flattening with mild spinal stenosis and moderate foraminal encroachment bilaterally.  C5-6:  Disc degeneration and spondylosis.  Mild spinal stenosis and moderate foraminal stenosis bilaterally.  C6-7:  Mild disc degeneration.  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  Mild spinal stenosis and mild foraminal encroachment at C3-4.  Mild spinal stenosis and moderate foraminal encroachment bilaterally at C4-5.  Mild spinal stenosis and moderate left foraminal encroachment at C5- 6.   Original Report Authenticated By: Janeece Riggers, M.D.    Mr Mra Head/brain Wo Cm  05/24/2012  *RADIOLOGY REPORT*  Clinical Data:  TIA.  Left face and arm  numbness  MRI HEAD WITHOUT CONTRAST MRA HEAD WITHOUT CONTRAST  Technique:  Multiplanar, multiecho pulse sequences of the brain and surrounding structures were obtained without intravenous contrast. Angiographic images of the head were obtained using MRA technique without contrast.  Comparison:  CT 05/23/2012  MRI HEAD  Findings:  Negative for acute infarct.  Scattered white matter hyperintensities bilaterally, most consistent with chronic microvascular ischemia.  Brainstem and cerebellum are intact.  Ventricle size is normal.  Negative for intracranial hemorrhage or mass lesion.  Paranasal sinuses are clear.  IMPRESSION: Mild chronic microvascular ischemia in the white matter.  No acute infarct.  MRA HEAD  Findings: Both vertebral arteries are patent to the basilar.  Right vertebral is dominant.  The basilar is widely patent.  Superior cerebellar and posterior cerebral arteries are patent bilaterally. Fetal origin of the left posterior cerebral artery.  Cavernous carotid is widely patent bilaterally without stenosis. Both anterior and middle cerebral arteries are patent without stenosis.  Negative for cerebral aneurysm.  IMPRESSION: Negative   Original Report Authenticated By: Janeece Riggers, M.D.     Scheduled Meds: . amLODipine  10 mg Oral QHS  . aspirin  325 mg Oral Daily  . enoxaparin (LOVENOX) injection  40 mg Subcutaneous Q24H  . FLUoxetine  10 mg Oral Daily  . folic acid  1 mg Oral Daily  . levothyroxine  100 mcg Oral Daily  . multivitamin with minerals  1 tablet Oral Daily  . simvastatin  10 mg Oral QPM  . thiamine  100 mg Oral Daily   Or  . thiamine  100 mg Intravenous Daily   Continuous Infusions:   Principal Problem:   Numbness of face Active Problems:   Numbness and tingling in left arm   Hypertension   Hyperlipemia   Depression   Anxiety   Unspecified hypothyroidism   Alcohol abuse, daily use        AKULA,VIJAYA  [facility] Pager [phone]. If 7PM-7AM, please  contact night-coverage at [email or web address], password [facility] 05/25/2012, 7:35 PM  LOS: 1 day

## 2012-05-25 NOTE — Progress Notes (Signed)
Pt. Refused meds ordered until further explanation by MD. Pt. Stated "I am not taking any of these medications until I talk to the doctor in the morning".

## 2012-05-25 NOTE — Clinical Social Work Psychosocial (Signed)
Clinical Social Work Department BRIEF PSYCHOSOCIAL ASSESSMENT 05/25/2012  Patient:  ToniToni [name]     Account Number:  [id number]     Admit date:  05/23/2012  Clinical Social Worker:  Peggyann Shoals  Date/Time:  05/25/2012 06:08 PM  Referred by:  Physician  Date Referred:  05/25/2012 Referred for  Substance Abuse   Other Referral:   Interview type:  Patient Other interview type:    PSYCHOSOCIAL DATA Living Status:  HUSBAND Admitted from facility:   Level of care:   Primary support name:  Natalyn Szymanowski Primary support relationship to patient:  SPOUSE Degree of support available:   adequate.    CURRENT CONCERNS Current Concerns  Substance Abuse   Other Concerns:    SOCIAL WORK ASSESSMENT / PLAN CSW met with pt to address consult. CSW introduced herself and explained role of social work. PT is not recommending any follow up at discharge.    Pt lives with her husband of almost 31 years. Pt endorses ETOH use and drinking 3 or more glasses of wine daily. Pt stated that if she drinks [name] bottle, then she would have drank too much. Pt shared that she has been drinking at this rate since retired, and she finds that she drinks more then she has more time on her hands. Pt also shared that she does feel the withdrawal effects in the morning, however does not start drinking until later in the day.    Pt shared that she has [name] history of anxiety and uses ETOH as [name] way of dealing with her anxiety. Pt demonstates insight that ETOH is not an appriopriate way to handle her anxiety.    Pt shared that she has some discord with her husband, and this influences her ETOH intake. Pt has sought marriage counseling in the past with her husband.    Pt expressed that she feels motivated to cut down on drinking, pt shared that she does not, nor does she feel that she needs to quit drinking all together. CSW and pt processed this and talked about different options she has for treatment. Pt feels that  she able to do this on her own, however is open to seeking assistance from [name] local agency. Pt displays insight to her ETOH use and her external influences. Pt also is aware of her motivation to cut down. Pt shared that this would take some time to complete but would feels that she will be able to cut down fairly easily.    CSW completed SBIRT. CSW provided resources and explained them to pt and how to access them. CSW is signing off at this time, as no further needs are identified. Please reconsult if [name] need arises prior to discharge.   Assessment/plan status:  No Further Intervention Required Other assessment/ plan:   Information/referral to community resources:   Mental Health follow up  Substance Abuse follow up  [facility] [place].    PATIENT'S/FAMILY'S RESPONSE TO PLAN OF CARE: Pt was very pleasant and open about her ETOH use. Pt was very thankful for CSW interventions.    Dede Query, MSW, Theresia Majors [phone]

## 2012-05-25 NOTE — Progress Notes (Signed)
  Echocardiogram 2D Echocardiogram has been performed.  Georgian Co 05/25/2012, 5:03 PM

## 2012-05-25 NOTE — Evaluation (Addendum)
Physical Therapy Evaluation Patient Details Name: [name] MRN: [id number] DOB: [birth year] Today's Date: 05/25/2012 Time: 4098-1191 PT Time Calculation (min): 22 min  PT Assessment / Plan / Recommendation Clinical Impression  Pt is a 67 yo female admitted for L sided numbness/tingling however reports symptoms to have completely resolved. Pt now functioning at baseline and safe to d/c home from mobility stand point. Pt with no further skilled acute PT needs at this time or PT needs upon follow up. Pt safe to d/c home with spouse when approved by MD. PT signing off, please re-consult if needed in future. THanks.    PT Assessment  Patent does not need any further PT services    Follow Up Recommendations  No PT follow up    Does the patient have the potential to tolerate intense rehabilitation      Barriers to Discharge        Equipment Recommendations       Recommendations for Other Services     Frequency      Precautions / Restrictions Precautions Precautions: None Restrictions Weight Bearing Restrictions: No   Pertinent Vitals/Pain Pt denies pain      Mobility  Bed Mobility Bed Mobility: Not assessed (pt sitting EOB) Transfers Transfers: Sit to Stand;Stand to Sit Sit to Stand: With upper extremity assist;From bed;7: Independent Stand to Sit: 7: Independent;With upper extremity assist;To chair/3-in-1 Details for Transfer Assistance: safe technique Ambulation/Gait Ambulation/Gait Assistance: 6: Modified independent (Device/Increase time) Ambulation Distance (Feet): 300 Feet Assistive device: None Ambulation/Gait Assistance Details: no episodes of LOB, WFL Gait Pattern: Within Functional Limits Gait velocity: wfl Stairs: Yes Stairs Assistance: 6: Modified independent (Device/Increase time) Stair Management Technique: No rails;One rail Right;Alternating pattern;Forwards (slight increase in time due to arthritis but safe) Number of Stairs: 10 Modified Rankin  (Stroke Patients Only) Pre-Morbid Rankin Score: No significant disability Modified Rankin: No significant disability    Exercises     PT Diagnosis:    PT Problem List:   PT Treatment Interventions:     PT Goals Acute Rehab PT Goals PT Goal Formulation:  (n/a)  Visit Information  Last PT Received On: 05/25/12 Assistance Needed: +1    Subjective Data  Subjective: Pt received sitting EOB eating breakfast ready for OOB mobility. Patient Stated Goal: home   Prior Functioning  Home Living Lives With: Spouse Available Help at Discharge: Family;Available 24 hours/day Type of Home: House Home Access: Stairs to enter [employer] of Steps: 2 Entrance Stairs-Rails: None Home Layout: One level Bathroom Shower/Tub: Engineer, manufacturing systems: Standard Bathroom Accessibility: Yes How Accessible: Accessible via walker Home Adaptive Equipment: None Prior Function Level of Independence: Independent Able to Take Stairs?: Yes Driving: Yes Vocation: Retired Musician: No difficulties Dominant Hand: Right    Cognition  Cognition Overall Cognitive Status: Appears within functional limits for tasks assessed/performed Arousal/Alertness: Awake/alert Orientation Level: Appears intact for tasks assessed;Oriented X4 / Intact Behavior During Session: [facility] for tasks performed    Extremity/Trunk Assessment Right Upper Extremity Assessment RUE ROM/Strength/Tone: Within functional levels RUE Sensation: WFL - Light Touch RUE Coordination: WFL - gross/fine motor Left Upper Extremity Assessment LUE ROM/Strength/Tone: Within functional levels LUE Sensation: WFL - Light Touch LUE Coordination: WFL - gross/fine motor Right Lower Extremity Assessment RLE ROM/Strength/Tone: Within functional levels RLE Sensation: WFL - Light Touch RLE Coordination: WFL - gross/fine motor Left Lower Extremity Assessment LLE ROM/Strength/Tone: Within functional levels LLE  Sensation: WFL - Light Touch LLE Coordination: WFL - gross/fine motor Trunk Assessment Trunk Assessment:  Normal   Patient with noted resting tremors/shakes of head. Pt reports this to be hereditary and many family members have the same thing.   Balance Standardized Balance Assessment Standardized Balance Assessment: Dynamic Gait Index Dynamic Gait Index Level Surface: Normal Change in Gait Speed: Normal Gait with Horizontal Head Turns: Normal Gait with Vertical Head Turns: Normal Gait and Pivot Turn: Normal Step Over Obstacle: Normal Step Around Obstacles: Normal Steps: Mild Impairment Total Score: 23  End of Session PT - End of Session Equipment Utilized During Treatment: Gait belt Activity Tolerance: Patient tolerated treatment well Patient left: in chair;with call bell/phone within reach Nurse Communication: Mobility status  GP Functional Assessment Tool Used: clinical judgement Functional Limitation: Mobility: Walking and moving around Mobility: Walking and Moving Around Current Status [phone]): 0 percent impaired, limited or restricted Mobility: Walking and Moving Around Goal Status [phone]): 0 percent impaired, limited or restricted Mobility: Walking and Moving Around Discharge Status [phone]): 0 percent impaired, limited or restricted   Marcene Brawn 05/25/2012, 8:51 AM  Lewis Shock, PT, DPT Pager #: [phone] Office #: [phone]

## 2012-05-26 DIAGNOSIS — G459 Transient cerebral ischemic attack, unspecified: Secondary | ICD-10-CM

## 2012-05-26 MED ORDER — SIMVASTATIN 20 MG PO TABS: 20.0000 mg | ORAL_TABLET | Freq: Every evening | ORAL | Status: DC

## 2012-05-26 MED ORDER — PANTOPRAZOLE SODIUM 40 MG PO TBEC: 40.0000 mg | DELAYED_RELEASE_TABLET | Freq: Every day | ORAL | Status: DC

## 2012-05-26 MED ORDER — ASPIRIN 325 MG PO TABS: 325.0000 mg | ORAL_TABLET | Freq: Every day | ORAL | Status: DC

## 2012-05-26 MED ORDER — FOLIC ACID 1 MG PO TABS: 1.0000 mg | ORAL_TABLET | Freq: Every day | ORAL | Status: DC

## 2012-05-26 MED ORDER — THIAMINE HCL 100 MG PO TABS: 100.0000 mg | ORAL_TABLET | Freq: Every day | ORAL | Status: DC

## 2012-05-26 MED ORDER — ASPIRIN 81 MG PO TBEC: 81.0000 mg | DELAYED_RELEASE_TABLET | Freq: Every day | ORAL | Status: DC

## 2012-05-26 NOTE — Care Management Note (Signed)
    Page 1 of 1   05/26/2012     1:33:09 PM   CARE MANAGEMENT NOTE 05/26/2012  Patient:  [name],[name]   Account Number:  [id number]  Date Initiated:  05/26/2012  Documentation initiated by:  [facility]  Subjective/Objective Assessment:   admitted for TIA workup     Action/Plan:   PT/OT evals-no follow up or equipment recommended   Anticipated DC Date:  05/26/2012   Anticipated DC Plan:  HOME/SELF CARE      DC Planning Services  CM consult      Choice offered to / List presented to:             Status of service:  Completed, signed off Medicare Important Message given?   (If response is "NO", the following Medicare IM given date fields will be blank) Date Medicare IM given:   Date Additional Medicare IM given:    Discharge Disposition:  HOME/SELF CARE  Per UR Regulation:  Reviewed for med. necessity/level of care/duration of stay  If discussed at Long Length of Stay Meetings, dates discussed:    Comments:

## 2012-05-26 NOTE — Discharge Summary (Signed)
Physician Discharge Summary  [name] UJW:[id number] DOB: [birth year] DOA: 05/24/2012  PCP: Lenora Boys, MD  Admit date: 05/24/2012 Discharge date: 05/26/2012    Recommendations for Outpatient Follow-up:  1. Follow up with PCP as recommended.   Discharge Diagnoses:  Principal Problem:   Numbness of face Active Problems:   Numbness and tingling in left arm   Hypertension   Hyperlipemia   Depression   Anxiety   Unspecified hypothyroidism   Alcohol abuse, daily use   Discharge Condition: fair  Diet recommendation: low salt diet  Filed Weights   05/23/12 1730 05/24/12 0528  Weight: 84.369 kg (186 lb) 83.1 kg (183 lb 3.2 oz)    History of present illness:   [name] is a 67 y.o. Caucasian female with history of hypertension, hypothyroidism, hyperlipidemia, depression and anxiety who presents with the above complaints. Patient reports that she has been under stress recently with family issues, she indicated that her daughter has recently had a heart attack, husband may have had a seizure, and has been living with a family member. She noted that yesterday at 11 a.m. she was watching television and had numbness on the left side of the face and arm. She had initially presented to urgent care but was referred to the emergency department. She was transferred from [facility] for further care and evaluation. Has not had any recent fevers, chills, nausea or vomiting. Does complain of chest pain but attributes that to her GERD, currently does does not complaining of any chest pain. Denies any abdominal pain, diarrhea, or vision changes. Complaining of left-sided headache.  Hospital Course:  Numbness/altered sensation of left face and arm  Etiology unclear.  InitiateD TIA protocol, SHE underwent MRI/MRA of the brain, 2-D echocardiogram, and carotid Dopplers, which were negative for stroke.  Will request PT/OT evaluation. Suspect this may be driven by anxiety and  alcohol consumption.  Alcohol use  No withdrawal symptoms.  Patient recognizes that this is a problem for her and is agreeable to meeting with social worker for resources for cessation at the time of discharge.  Hypertension  Stable. Continue home antihypertensive medication.  Hypothyroidism  Continue levothyroxine.  Hyperlipidemia  Continue statin.  Depression/anxiety  Continue her medication. Suspect some of the anxiety may be triggering her above symptoms.    Consultations:  NONE  Discharge Exam: Filed Vitals:   05/25/12 2218 05/25/12 2245 05/26/12 0151 05/26/12 0617  BP: 130/80 142/71 122/63 113/71  Pulse:  89 81 84  Temp:  97.9 F (36.6 C) 97.8 F (36.6 C) 98.1 F (36.7 C)  TempSrc:  Oral Oral Oral  Resp:  16 15 16   Height:      Weight:      SpO2:  98% 100% 98%   General: Awake, Oriented x3, No acute distress.  HEENT: EOMI, Moist mucous membranes  Neck: Supple  CV: S1 and S2  Lungs: Clear to ascultation bilaterally  Abdomen: Soft, Nontender, Nondistended, +bowel sounds.  Ext: Good pulses. Trace edema. No clubbing or cyanosis noted.  Neuro: Cranial Nerves II-XII grossly intact. Has 5/5 motor strength in upper and lower extremities. Denies any altered sensation/paresthesia on the left side of the face to touch    Discharge Instructions  Discharge Orders   Future Orders Complete By Expires     Activity as tolerated - No restrictions  As directed     Call MD for:  persistant dizziness or light-headedness  As directed     Discharge instructions  As directed     Comments:      Follow up with PCP in 1 week, and if she is intolerant to aspirin 325 mg , it will have to be changed to plavix 75 mg daily.  Follow up with [facility] in 1 to 2 months if symptoms persist.        Medication List    TAKE these medications       ALPRAZolam 0.5 MG tablet  Commonly known as:  XANAX  Take 0.125 mg by mouth 3 (three) times daily as needed for anxiety.      amLODipine 10 MG tablet  Commonly known as:  NORVASC  Take 10 mg by mouth at bedtime.     aspirin 325 MG tablet  Take 1 tablet (325 mg total) by mouth daily.     FLUoxetine 10 MG capsule  Commonly known as:  PROZAC  Take 10 mg by mouth daily.     folic acid 1 MG tablet  Commonly known as:  FOLVITE  Take 1 tablet (1 mg total) by mouth daily.     levothyroxine 100 MCG tablet  Commonly known as:  SYNTHROID, LEVOTHROID  Take 100 mcg by mouth daily.     pantoprazole 40 MG tablet  Commonly known as:  PROTONIX  Take 1 tablet (40 mg total) by mouth daily.     simvastatin 20 MG tablet  Commonly known as:  ZOCOR  Take 1 tablet (20 mg total) by mouth every evening.     thiamine 100 MG tablet  Take 1 tablet (100 mg total) by mouth daily.           Follow-up Information   Follow up with FRIED, ROBERT L, MD In 1 week.   Contact information:   [address] [place] [address] [phone]        The results of significant diagnostics from this hospitalization (including imaging, microbiology, ancillary and laboratory) are listed below for reference.    Significant Diagnostic Studies: Dg Chest 2 View  05/23/2012  *RADIOLOGY REPORT*  Clinical Data: Numbness  CHEST - 2 VIEW  Comparison: None.  Findings: The lungs are well-aerated and free from pulmonary edema, focal airspace consolidation or pulmonary nodule.  Cardiac and mediastinal contours are within normal limits.  No pneumothorax, or pleural effusion. No acute osseous findings.  IMPRESSION:  No acute cardiopulmonary disease.   Original Report Authenticated By: Malachy Moan, M.D.    Ct Head Wo Contrast  05/23/2012  *RADIOLOGY REPORT*  Clinical Data: Left-sided facial, neck and arm numbness.  CT HEAD WITHOUT CONTRAST  Technique:  Contiguous axial images were obtained from the base of the skull through the vertex without contrast.  Comparison: None.  Findings: There is no evidence of acute infarction, mass lesion, or intra- or extra-axial  hemorrhage on CT.  Scattered periventricular white matter change likely reflects small vessel ischemic microangiopathy.  The posterior fossa, including the cerebellum, brainstem and fourth ventricle, is within normal limits.  The third and lateral ventricles, and basal ganglia are unremarkable in appearance.  The cerebral hemispheres are symmetric in appearance, with normal gray- white differentiation.  No mass effect or midline shift is seen.  There is no evidence of fracture; visualized osseous structures are unremarkable in appearance.  The orbits are within normal limits. The paranasal sinuses and mastoid air cells are well-aerated.  No significant soft tissue abnormalities are seen.  IMPRESSION:  1.  No acute intracranial pathology seen on CT.  However, CT is generally  insensitive for very acute infarct. 2.  Scattered small vessel ischemic microangiopathy.   Original Report Authenticated By: Tonia Ghent, M.D.    Mri Brain Without Contrast  05/24/2012  *RADIOLOGY REPORT*  Clinical Data:  TIA.  Left face and arm numbness  MRI HEAD WITHOUT CONTRAST MRA HEAD WITHOUT CONTRAST  Technique:  Multiplanar, multiecho pulse sequences of the brain and surrounding structures were obtained without intravenous contrast. Angiographic images of the head were obtained using MRA technique without contrast.  Comparison:  CT 05/23/2012  MRI HEAD  Findings:  Negative for acute infarct.  Scattered white matter hyperintensities bilaterally, most consistent with chronic microvascular ischemia.  Brainstem and cerebellum are intact.  Ventricle size is normal.  Negative for intracranial hemorrhage or mass lesion.  Paranasal sinuses are clear.  IMPRESSION: Mild chronic microvascular ischemia in the white matter.  No acute infarct.  MRA HEAD  Findings: Both vertebral arteries are patent to the basilar.  Right vertebral is dominant.  The basilar is widely patent.  Superior cerebellar and posterior cerebral arteries are patent bilaterally.  Fetal origin of the left posterior cerebral artery.  Cavernous carotid is widely patent bilaterally without stenosis. Both anterior and middle cerebral arteries are patent without stenosis.  Negative for cerebral aneurysm.  IMPRESSION: Negative   Original Report Authenticated By: Janeece Riggers, M.D.    Mr Cervical Spine Wo Contrast  05/24/2012  *RADIOLOGY REPORT*  Clinical Data: Left face and arm numbness  MRI CERVICAL SPINE WITHOUT CONTRAST  Technique:  Multiplanar and multiecho pulse sequences of the cervical spine, to include the craniocervical junction and cervicothoracic junction, were obtained according to standard protocol without intravenous contrast.  Comparison: None.  Findings: Normal cervical alignment.  Negative for fracture or mass.  Spinal cord signal is normal.  No cord compression.  C2-3:  Negative  C3-4:  Mild disc degeneration with central disc and osteophyte flattening the cord.  Mild spinal stenosis.  Left foraminal narrowing due to spurring.  C4-5:  Disc degeneration and spondylosis with diffuse uncinate spurring.  There is cord flattening with mild spinal stenosis and moderate foraminal encroachment bilaterally.  C5-6:  Disc degeneration and spondylosis.  Mild spinal stenosis and moderate foraminal stenosis bilaterally.  C6-7:  Mild disc degeneration.  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  Mild spinal stenosis and mild foraminal encroachment at C3-4.  Mild spinal stenosis and moderate foraminal encroachment bilaterally at C4-5.  Mild spinal stenosis and moderate left foraminal encroachment at C5- 6.   Original Report Authenticated By: Janeece Riggers, M.D.    Mr Mra Head/brain Wo Cm  05/24/2012  *RADIOLOGY REPORT*  Clinical Data:  TIA.  Left face and arm numbness  MRI HEAD WITHOUT CONTRAST MRA HEAD WITHOUT CONTRAST  Technique:  Multiplanar, multiecho pulse sequences of the brain and surrounding structures were obtained without intravenous contrast.  Angiographic images of the head were obtained using MRA technique without contrast.  Comparison:  CT 05/23/2012  MRI HEAD  Findings:  Negative for acute infarct.  Scattered white matter hyperintensities bilaterally, most consistent with chronic microvascular ischemia.  Brainstem and cerebellum are intact.  Ventricle size is normal.  Negative for intracranial hemorrhage or mass lesion.  Paranasal sinuses are clear.  IMPRESSION: Mild chronic microvascular ischemia in the white matter.  No acute infarct.  MRA HEAD  Findings: Both vertebral arteries are patent to the basilar.  Right vertebral is dominant.  The basilar is widely patent.  Superior cerebellar and posterior cerebral arteries are patent  bilaterally. Fetal origin of the left posterior cerebral artery.  Cavernous carotid is widely patent bilaterally without stenosis. Both anterior and middle cerebral arteries are patent without stenosis.  Negative for cerebral aneurysm.  IMPRESSION: Negative   Original Report Authenticated By: Janeece Riggers, M.D.     Microbiology: No results found for this or any previous visit (from the past 240 hour(s)).   Labs: Basic Metabolic Panel:  Recent Labs Lab 05/23/12 1754 05/25/12 0755  NA 137 142  K 3.9 4.1  CL 99 105  CO2 26 24  GLUCOSE 98 113*  BUN 15 13  CREATININE 0.95 0.89  CALCIUM 10.1 9.2   Liver Function Tests:  Recent Labs Lab 05/23/12 1754  AST 19  ALT 21  ALKPHOS 77  BILITOT 0.5  PROT 8.0  ALBUMIN 4.6   No results found for this basename: LIPASE, AMYLASE,  in the last 168 hours No results found for this basename: AMMONIA,  in the last 168 hours CBC:  Recent Labs Lab 05/23/12 1754 05/25/12 0755  WBC 8.7 8.7  NEUTROABS 4.7  --   HGB 15.7* 15.2*  HCT 44.3 44.5  MCV 89.3 93.3  PLT 192 188   Cardiac Enzymes: No results found for this basename: CKTOTAL, CKMB, CKMBINDEX, TROPONINI,  in the last 168 hours BNP: BNP (last 3 results) No results found for this basename: PROBNP,  in  the last 8760 hours CBG: No results found for this basename: GLUCAP,  in the last 168 hours     Signed:  AKULA,VIJAYA  [facility] 05/26/2012, 12:03 PM

## 2012-11-09 ENCOUNTER — Ambulatory Visit (INDEPENDENT_AMBULATORY_CARE_PROVIDER_SITE_OTHER): Payer: Medicare Other | Admitting: Neurology

## 2012-11-09 ENCOUNTER — Encounter: Payer: Self-pay | Admitting: Neurology

## 2012-11-09 VITALS — BP 128/85 | HR 85 | Temp 98.3°F | Ht 64.5 in | Wt 189.0 lb

## 2012-11-09 DIAGNOSIS — F1090 Alcohol use, unspecified, uncomplicated: Secondary | ICD-10-CM

## 2012-11-09 DIAGNOSIS — G459 Transient cerebral ischemic attack, unspecified: Secondary | ICD-10-CM

## 2012-11-09 DIAGNOSIS — R209 Unspecified disturbances of skin sensation: Secondary | ICD-10-CM

## 2012-11-09 DIAGNOSIS — G250 Essential tremor: Secondary | ICD-10-CM

## 2012-11-09 DIAGNOSIS — R200 Anesthesia of skin: Secondary | ICD-10-CM

## 2012-11-09 DIAGNOSIS — G4733 Obstructive sleep apnea (adult) (pediatric): Secondary | ICD-10-CM

## 2012-11-09 DIAGNOSIS — Z789 Other specified health status: Secondary | ICD-10-CM

## 2012-11-09 DIAGNOSIS — F1010 Alcohol abuse, uncomplicated: Secondary | ICD-10-CM

## 2012-11-09 NOTE — Progress Notes (Signed)
Subjective:    Patient ID: [name] is a 67 y.o. female.  HPI    Huston Foley, MD, PhD [facility] [address], [address] [address] [place], [place] [address]  Dear Dr. Foy Guadalajara,   I saw your patient, [name], upon your kind request in my neurologic clinic today for initial consultation of her paresthesias. The patient is unaccompanied today. As you know, [name] is a very friendly 67 year old right-handed woman with an underlying medical history of hypertension, hyperlipidemia, hypothyroidism, depression, arthritis who has been experiencing intermittent left face arm and leg numbness for the past several months. She was seen at [facility] ER on 05/23/2012 where she had workup including head CT which was negative, an MRI and MRA brain which were negative, C. doppler, which were negative and a echocardiogram, with EF of 50-55% and no thrombus. She was told that she had had a TIA. While her symptoms are intermittent she feels that her left side does not feel the same as the right. The hospital physician recommended that she take folic acid, thiamine and pantoprazole which she had not started. She was also instructed to cut back on her alcohol. She has cut back but indicated that she will not stop cold [place]. In March you started her on clonazepam for anxiety. She was advised to start pantoprazole for gastroesophageal reflux. He did advise her to start thiamine for heavy alcohol consumption as well as folic acid and baby aspirin. She takes ASA 325 mg, but not daily. She estimates that she takes it 5 times a week. She has reduced her alcohol consumption to 2 glasses of wine per day. she quit smoking some 15 years ago. She denies any illicit drug use. She had a sleep study several years ago and was given a CPAP machine, but did not tolerate CPAP at the time.  Her LDL was 138 in March 2014 and she is on Zocor, but was not taking it regularly at the time.   In May she had similar symptoms and on 10/10/2012 she presented back to the walk-in clinic at [facility] with paresthesias in the left jaw, left arm. She was treated with a Medrol Dosepak and advised that if symptoms were not improved that she should go to the emergency room. She did not get that Rx for the steroid filled.  She has intermittent numbness, none at this moment and denies LOC or convulsion.  She has a head and hand tremor and a FHx of tremor in her mother and her sisters.  Her Past Medical History Is Significant For: Past Medical History  Diagnosis Date  . Hypertension   . Thyroid disease   . Hyperlipemia   . Depression   . Anxiety     Her Past Surgical History Is Significant For: History reviewed. No pertinent past surgical history.  Her Family History Is Significant For: Family History  Problem Relation Age of Onset  . Diabetes Mother   . Dementia Mother   . COPD Father   . Heart attack Daughter     Her Social History Is Significant For: History   Social History  . Marital Status: Married    Spouse Name: N/A    Number of Children: N/A  . Years of Education: N/A   Social History Main Topics  . Smoking status: Former Smoker    Quit date: 05/25/1987  . Smokeless tobacco: None  . Alcohol Use: Yes     Comment: 3 glasses  of wine daily  . Drug Use: No  . Sexual Activity: None   Other Topics Concern  . None   Social History Narrative  . None    Her Allergies Are:  Allergies  Allergen Reactions  . Penicillins Other (See Comments)    Unknown reaction  :   Her Current Medications Are:  Outpatient Encounter Prescriptions as of 11/09/2012  Medication Sig Dispense Refill  . amLODipine (NORVASC) 10 MG tablet Take 10 mg by mouth at bedtime.      Marland Kitchen aspirin 325 MG tablet Take 1 tablet (325 mg total) by mouth daily.  30 tablet  1  . clonazePAM (KLONOPIN) 0.5 MG tablet Take 0.25 mg by mouth 2 (two) times daily as needed for anxiety.      Marland Kitchen FLUoxetine  (PROZAC) 10 MG capsule Take 10 mg by mouth daily.      Marland Kitchen levothyroxine (SYNTHROID, LEVOTHROID) 100 MCG tablet Take 100 mcg by mouth daily.      . simvastatin (ZOCOR) 20 MG tablet Take 1 tablet (20 mg total) by mouth every evening.  30 tablet  0  . pantoprazole (PROTONIX) 40 MG tablet Take 1 tablet (40 mg total) by mouth daily.  30 tablet  0  . [DISCONTINUED] ALPRAZolam (XANAX) 0.5 MG tablet Take 0.125 mg by mouth 3 (three) times daily as needed for anxiety.      . [DISCONTINUED] folic acid (FOLVITE) 1 MG tablet Take 1 tablet (1 mg total) by mouth daily.  30 tablet  0  . [DISCONTINUED] thiamine 100 MG tablet Take 1 tablet (100 mg total) by mouth daily.  30 tablet  0   No facility-administered encounter medications on file as of 11/09/2012.   Review of Systems  Constitutional: Positive for activity change and fatigue.  HENT: Positive for rhinorrhea, trouble swallowing and tinnitus.   Eyes: Positive for pain.  Respiratory: Positive for shortness of breath.        Snoring  Endocrine: Positive for cold intolerance and heat intolerance.  Musculoskeletal: Positive for myalgias, joint swelling and arthralgias.  Allergic/Immunologic: Positive for environmental allergies.  Neurological: Positive for dizziness, tremors, weakness and numbness.  Psychiatric/Behavioral: Positive for dysphoric mood. The patient is nervous/anxious.        Racing thoughts    Objective:  Neurologic Exam  Physical Exam Physical Examination:   Filed Vitals:   11/09/12 1353  BP: 128/85  Pulse: 85  Temp: 98.3 F (36.8 C)    General Examination: The patient is a very pleasant 67 y.o. female in no acute distress. She appears well-developed and well-nourished and well groomed. She is mildly anxious appearing.  HEENT: Normocephalic, atraumatic, pupils are equal, round and reactive to light and accommodation. Extraocular tracking is good without limitation to gaze excursion or nystagmus noted. Normal smooth pursuit is  noted. Hearing is grossly intact. Face is symmetric with normal facial animation and normal facial sensation. Speech is clear with no dysarthria noted. There is no hypophonia. There is a no-no type had tremor Neck is supple with full range of passive and active motion. There are no carotid bruits on auscultation. Oropharynx exam reveals: mild mouth dryness, adequate dental hygiene and moderate airway crowding, due to redundant soft palate. Mallampati is class II. Tongue protrudes centrally and palate elevates symmetrically. Tonsils are 1+ in size  Chest: Clear to auscultation without wheezing, rhonchi or crackles noted.  Heart: S1+S2+0, regular and normal without murmurs, rubs or gallops noted.   Abdomen: Soft, non-tender and non-distended with normal bowel  sounds appreciated on auscultation.  Extremities: There is trace pitting edema in the distal lower extremities bilaterally. Pedal pulses are intact.  Skin: Warm and dry without trophic changes noted. There are no varicose veins.  Musculoskeletal: exam reveals no obvious joint deformities, tenderness or joint swelling or erythema.   Neurologically:  Mental status: The patient is awake, alert and oriented in all 4 spheres. Her memory, attention, language and knowledge are appropriate. There is no aphasia, agnosia, apraxia or anomia. Speech is clear with normal prosody and enunciation. Thought process is linear. Mood is congruent and affect is constricted.  Cranial nerves are as described above under HEENT exam. In addition, shoulder shrug is normal with equal shoulder height noted. Motor exam: Normal bulk, strength and tone is noted. There is no drift, or rebound and she has a subtle bilateral upper extremity postural tremor, no action tremor is noted. Romberg is negative. Reflexes are 2+ throughout. Toes are downgoing bilaterally. Fine motor skills are intact with normal finger taps, normal hand movements, normal rapid alternating patting, normal  foot taps and normal foot agility.  Cerebellar testing shows no dysmetria or intention tremor on finger to nose testing. Heel to shin is unremarkable bilaterally. There is no truncal or gait ataxia.  Sensory exam is intact to light touch, pinprick, vibration, temperature sense and proprioception in the upper and lower extremities, with the exception of decreased pinprick sensation in the distal left foot.  Gait, station and balance are unremarkable. No veering to one side is noted. No leaning to one side is noted. Posture is age-appropriate and stance is narrow based. No problems turning are noted. She turns en bloc. .               Assessment and Plan:   In summary, [name] is a very pleasant 67 y.o.-year old female with a history of intermittent left hemibody numbness. Of note, this may last hours at a time. There is no trigger. She was seen in the hospital and in the urgent care on multiple occasions for this. She had extensive workup in March for TIA. She was told that she had a TIA and she was advised to reduce her alcohol consumption, start taking aspirin regularly and start taking her cholesterol medicine regularly. She has been intermittently compliant with treatment. I told her that after her TIA or after her stroke secondary prevention is key which includes diabetes management, cholesterol management, blood pressure management, weight management and screen for sleep apnea. She has borderline hemoglobin A1c at 6.4 and her glucose was elevated in March. She also had an elevated LDL and is advised today to take aspirin daily, to take her Zocor daily. She's also advised to discuss diabetes management with you at your next visit. She is advised to undergo reevaluation for obstructive sleep apnea as untreated moderate to severe obstructive sleep apnea can increase stroke risk. She is furthermore advised to further reduce her alcohol consumption and exercise regularly. I would like to also  proceed with an EEG because of the paroxysmal nature of her numbness. She was in agreement. I will see her back after these tests are completed.  Thank you very much for allowing me to participate in the care of this nice patient. If I can be of any further assistance to you please do not hesitate to call me at [phone].  Sincerely,   Huston Foley, MD, PhD

## 2012-11-09 NOTE — Patient Instructions (Addendum)
As discussed, secondary prevention is key after a stroke. This means: taking care of blood sugar values or diabetes management, good blood pressure (hypertension) control and optimizing cholesterol management, exercising daily or regularly within your own mobility limitations of course and overall cardiovascular risk factor reduction, which includes screening for and treatment of obstructive sleep apnea (OSA).   Take aspirin daily and simvastatin daily, reduce your alcohol intake. Exercise daily. You may need to take over the counter acid reducing medicine to protect your stomach.

## 2012-11-12 ENCOUNTER — Ambulatory Visit (INDEPENDENT_AMBULATORY_CARE_PROVIDER_SITE_OTHER): Payer: Medicare Other

## 2012-11-12 DIAGNOSIS — G459 Transient cerebral ischemic attack, unspecified: Secondary | ICD-10-CM

## 2012-11-12 DIAGNOSIS — F1090 Alcohol use, unspecified, uncomplicated: Secondary | ICD-10-CM

## 2012-11-12 DIAGNOSIS — R200 Anesthesia of skin: Secondary | ICD-10-CM

## 2012-11-12 DIAGNOSIS — Z789 Other specified health status: Secondary | ICD-10-CM

## 2012-11-12 DIAGNOSIS — G250 Essential tremor: Secondary | ICD-10-CM

## 2012-11-12 DIAGNOSIS — G4733 Obstructive sleep apnea (adult) (pediatric): Secondary | ICD-10-CM

## 2012-11-12 NOTE — Procedures (Signed)
  History:  [name] is a 67 year old patient with episodes of transient left-sided numbness. 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 The patient is being evaluated for the transient left-sided sensory changes.  This is a routine EEG. No skull defects are noted. Medications include Norvasc, aspirin, clonazepam, Prozac, Synthroid, Protonix, and Zocor.  EEG classification: Normal awake  Description of the recording: The background rhythms of this recording consists of a fairly well modulated medium amplitude alpha rhythm of 9 Hz that is reactive to eye opening and closure. As the record progresses, the patient appears to remain in the waking state throughout the recording. Photic stimulation was performed, resulting in a bilateral and symmetric photic driving response. Hyperventilation was also performed, resulting in a minimal buildup of the background rhythm activities without significant slowing seen. At no time during the recording does there appear to be evidence of spike or spike wave discharges or evidence of focal slowing. EKG monitor shows no evidence of cardiac rhythm abnormalities with a heart rate of 66.  Impression: This is a normal EEG recording in the waking state. No evidence of ictal or interictal discharges are seen.

## 2012-11-13 ENCOUNTER — Telehealth: Payer: Self-pay | Admitting: *Deleted

## 2012-11-13 NOTE — Telephone Encounter (Signed)
Please call patient with EEG Results. I have checked and the final is available as of 8/28 @ 8:00 pm

## 2012-11-13 NOTE — Telephone Encounter (Signed)
I called patient and reviewed EEG results with her. She said this was great news and wished [place] all a great weekend.

## 2012-11-13 NOTE — Progress Notes (Signed)
Quick Note:  Please call and advise the patient that the EEG or brain wave test we performed was reported as normal in the awake state. We checked for abnormal electrical discharges in the brain waves and the report suggested normal findings. No further action is required on this test at this time. Please remind patient to keep any upcoming appointments or tests and to call us with any interim questions, concerns, problems or updates. Thanks,  Vansh Reckart, MD, PhD    ______ 

## 2012-11-17 ENCOUNTER — Telehealth: Payer: Self-pay

## 2012-11-17 NOTE — Telephone Encounter (Signed)
I called and spoke with patient's husband. I let him know that patient's EEG was normal. He stated that he would tell his wife.

## 2012-11-17 NOTE — Telephone Encounter (Signed)
Message copied by [facility] on Tue Nov 17, 2012  9:01 AM ------      Message from: Huston Foley      Created: Fri Nov 13, 2012  3:41 PM       Please call and advise the patient that the EEG or brain wave test we performed was reported as normal in the awake state. We checked for abnormal electrical discharges in the brain waves and the report suggested normal findings. No further action is required on this test at this time. Please remind patient to keep any upcoming appointments or tests and to call us with any interim questions, concerns, problems or updates. Thanks,       Huston Foley, MD, PhD                   ------

## 2012-11-24 ENCOUNTER — Other Ambulatory Visit: Payer: Medicare Other | Admitting: Radiology

## 2012-12-28 ENCOUNTER — Ambulatory Visit (INDEPENDENT_AMBULATORY_CARE_PROVIDER_SITE_OTHER): Payer: No Typology Code available for payment source | Admitting: Psychology

## 2012-12-28 DIAGNOSIS — F411 Generalized anxiety disorder: Secondary | ICD-10-CM

## 2013-01-04 ENCOUNTER — Ambulatory Visit: Payer: No Typology Code available for payment source | Admitting: Psychology

## 2013-03-30 ENCOUNTER — Other Ambulatory Visit: Payer: Self-pay | Admitting: Family Medicine

## 2013-03-30 DIAGNOSIS — E041 Nontoxic single thyroid nodule: Secondary | ICD-10-CM

## 2013-04-01 ENCOUNTER — Other Ambulatory Visit: Payer: Self-pay

## 2013-04-05 ENCOUNTER — Ambulatory Visit
Admission: RE | Admit: 2013-04-05 | Discharge: 2013-04-05 | Disposition: A | Discharge status: Home / Self Care | Payer: Medicare HMO | Source: Ambulatory Visit | Attending: Family Medicine | Admitting: Family Medicine

## 2013-04-05 DIAGNOSIS — E041 Nontoxic single thyroid nodule: Secondary | ICD-10-CM

## 2013-05-05 ENCOUNTER — Other Ambulatory Visit: Payer: Self-pay | Admitting: Gastroenterology

## 2013-05-05 DIAGNOSIS — Z1211 Encounter for screening for malignant neoplasm of colon: Secondary | ICD-10-CM

## 2013-05-31 ENCOUNTER — Other Ambulatory Visit: Payer: Self-pay

## 2013-06-01 ENCOUNTER — Other Ambulatory Visit: Payer: Self-pay

## 2013-06-14 ENCOUNTER — Ambulatory Visit
Admission: RE | Admit: 2013-06-14 | Discharge: 2013-06-14 | Disposition: A | Discharge status: Home / Self Care | Payer: Medicare HMO | Source: Ambulatory Visit | Attending: Gastroenterology | Admitting: Gastroenterology

## 2013-06-14 ENCOUNTER — Other Ambulatory Visit: Payer: Self-pay | Admitting: Gastroenterology

## 2013-06-14 DIAGNOSIS — Z1211 Encounter for screening for malignant neoplasm of colon: Secondary | ICD-10-CM

## 2013-11-16 ENCOUNTER — Ambulatory Visit (HOSPITAL_COMMUNITY)
Admission: RE | Admit: 2013-11-16 | Discharge: 2013-11-16 | Disposition: A | Discharge status: Home / Self Care | Payer: No Typology Code available for payment source | Attending: Psychiatry | Admitting: Psychiatry

## 2013-11-16 NOTE — BH Assessment (Signed)
Assessment Note  [name] is an 68 y.o. female. Pt presents to [facility] for an assessment. Pt reports with C/O Depression and anxiety and reports that her husband and her daughter are driving her "crazy".  Patient reports that her PCP referred her to [facility]. Patient reports that she informed her PCP about her Depression and Anxiety by telephone last week. Patient reports that she would like to talk to someone and seek counseling about her communication problems and conflict with her husband and daughter.  Patient reports that she is not happy and that her husband is angry and yells at her all the time. Patient reports frequent panic attacks but reports that they have decreased since taking Klonopin. Pt reports that she does not get along well with her daughter because her daughter has mental problems also. Patient reports that she had to stay in her [place]per in [place] last week to get a break from her husband. Pt reports that she weaned her self off of her Prozac about 1 month ago because she did not think it was helping her. Patient reports that she drinks 2-3 glasses of wine per day. Pt denies SI,HI, and no AVH reported.  Consulted with AC Letitia Libra and Dr.Kumar  whom are recommending that patient seek Psych IOP services. Pt is in agreement with this plan. Patient is scheduled to meet with Dellia Nims on 11-26-13 at 8:45 to begin IOP services.   MSE completed by Elvera Bicker.  Axis I: Depressive Disorder NOS Axis II: Deferred Axis III:  Past Medical History  Diagnosis Date  . Hypertension   . Thyroid disease   . Hyperlipemia   . Depression   . Anxiety    Axis IV: other psychosocial or environmental problems and problems related to social environment Axis V: 51-60 moderate symptoms  Past Medical History:  Past Medical History  Diagnosis Date  . Hypertension   . Thyroid disease   . Hyperlipemia   . Depression   . Anxiety     No past surgical history on file.  Family History:   Family History  Problem Relation Age of Onset  . Diabetes Mother   . Dementia Mother   . COPD Father   . Heart attack Daughter     Social History:  reports that she quit smoking about 26 years ago. She does not have any smokeless tobacco history on file. She reports that she drinks alcohol. She reports that she does not use illicit drugs.  Additional Social History:  Alcohol / Drug Use History of alcohol / drug use?: Yes Substance #1 Name of Substance 1:  (Etoh) 1 - Age of First Use:  (30's) 1 - Amount (size/oz):  (2-3 glasses of wine) 1 - Frequency:  (daily) 1 - Duration:  (years) 1 - Last Use / Amount:  (8-31/15)  CIWA:   COWS:    Allergies:  Allergies  Allergen Reactions  . Penicillins Other (See Comments)    Unknown reaction    Home Medications:  (Not in a hospital admission)  OB/GYN Status:  No LMP recorded.  General Assessment Data Location of Assessment: [facility] Is this a Tele or Face-to-Face Assessment?: Face-to-Face Is this an Initial Assessment or a Re-assessment for this encounter?: Initial Assessment Living Arrangements: Spouse/significant other Can pt return to current living arrangement?: Yes Admission Status: Voluntary Is patient capable of signing voluntary admission?: Yes Transfer from: Home Referral Source: Other (PCP)     [facility] Crisis Care Plan Living Arrangements: Spouse/significant  other Name of Psychiatrist: No Current Provider Name of Therapist: No Current Provider     Risk to self with the past 6 months Suicidal Ideation: No Suicidal Intent: No Is patient at risk for suicide?: No Suicidal Plan?: No Access to Means: No What has been your use of drugs/alcohol within the last 12 months?: etoh-wine Previous Attempts/Gestures: No How many times?: 0 Other Self Harm Risks: none reported Triggers for Past Attempts: None known Intentional Self Injurious Behavior: None Family Suicide History: No Recent stressful life  event(s): Conflict (Comment) (marital discord, communication issues with daughter) Persecutory voices/beliefs?: No Depression: Yes Depression Symptoms: Tearfulness;Feeling worthless/self pity;Feeling angry/irritable Substance abuse history and/or treatment for substance abuse?: Yes Suicide prevention information given to non-admitted patients: Yes  Risk to Others within the past 6 months Homicidal Ideation: No Thoughts of Harm to Others: No Current Homicidal Intent: No Current Homicidal Plan: No Access to Homicidal Means: No Identified Victim: na History of harm to others?: No Assessment of Violence: None Noted Violent Behavior Description: None Reported Does patient have access to weapons?: No Criminal Charges Pending?: No Does patient have a court date: No  Psychosis Hallucinations: None noted Delusions: None noted  Mental Status Report Appear/Hygiene: Other (Comment) (appropriate) Eye Contact: Good Motor Activity: Freedom of movement Speech: Logical/coherent Level of Consciousness: Alert Mood: Depressed;Sad Affect: Appropriate to circumstance Anxiety Level: Minimal Thought Processes: Coherent;Relevant Judgement: Unimpaired Orientation: Person;Place;Time;Situation Obsessive Compulsive Thoughts/Behaviors: None  Cognitive Functioning Concentration: Normal Memory: Recent Intact;Remote Intact IQ: Average Insight: Fair Impulse Control: Fair Appetite: Fair Weight Loss: 0 Weight Gain: 0 Sleep: No Change Total Hours of Sleep: 8 Vegetative Symptoms: None  ADLScreening [facility] Assessment Services) Patient's cognitive ability adequate to safely complete daily activities?: Yes Patient able to express need for assistance with ADLs?: Yes Independently performs ADLs?: Yes (appropriate for developmental age)  Prior Inpatient Therapy Prior Inpatient Therapy: Yes Prior Therapy Dates: @age  28 Prior Therapy Facilty/Provider(s): [facility] Reason for Treatment:  Depression  Prior Outpatient Therapy Prior Outpatient Therapy: No Prior Therapy Dates: na Prior Therapy Facilty/Provider(s): na Reason for Treatment: na  ADL Screening (condition at time of admission) Patient's cognitive ability adequate to safely complete daily activities?: Yes Is the patient deaf or have difficulty hearing?: No Does the patient have difficulty seeing, even when wearing glasses/contacts?: No Does the patient have difficulty concentrating, remembering, or making decisions?: No Patient able to express need for assistance with ADLs?: Yes Does the patient have difficulty dressing or bathing?: No Independently performs ADLs?: Yes (appropriate for developmental age) Does the patient have difficulty walking or climbing stairs?: No Weakness of Legs: None Weakness of Arms/Hands: None  Home Assistive Devices/Equipment Home Assistive Devices/Equipment: None    Abuse/Neglect Assessment (Assessment to be complete while patient is alone) Physical Abuse: Denies Verbal Abuse: Denies Sexual Abuse: Denies Exploitation of patient/patient's resources: Denies Self-Neglect: Denies     Regulatory affairs officer (For Healthcare) Does patient have an advance directive?: No Would patient like information on creating an advanced directive?: No - patient declined information    Additional Information 1:1 In Past 12 Months?: No CIRT Risk: No Elopement Risk: No Does patient have medical clearance?: No     Disposition:  Disposition Initial Assessment Completed for this Encounter: Yes Disposition of Patient: Other dispositions (Pt referred to Psych IOP with a start date of 11-26-13.) Type of outpatient treatment: Psych Intensive Outpatient Other disposition(s): Other (Comment)  On Site Evaluation by:   Reviewed with Physician:    Wellington Hampshire, MS,  [facility] Assessment Counselor  11/16/2013 9:15 PM
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
## 2013-11-26 ENCOUNTER — Encounter (HOSPITAL_COMMUNITY): Payer: Self-pay

## 2013-11-26 ENCOUNTER — Other Ambulatory Visit (HOSPITAL_COMMUNITY): Payer: Medicare HMO | Attending: Psychiatry | Admitting: Psychiatry

## 2013-11-26 DIAGNOSIS — Z609 Problem related to social environment, unspecified: Secondary | ICD-10-CM | POA: Insufficient documentation

## 2013-11-26 DIAGNOSIS — Z598 Other problems related to housing and economic circumstances: Secondary | ICD-10-CM | POA: Diagnosis not present

## 2013-11-26 DIAGNOSIS — F332 Major depressive disorder, recurrent severe without psychotic features: Secondary | ICD-10-CM | POA: Diagnosis not present

## 2013-11-26 DIAGNOSIS — F410 Panic disorder [episodic paroxysmal anxiety] without agoraphobia: Secondary | ICD-10-CM

## 2013-11-26 DIAGNOSIS — G4700 Insomnia, unspecified: Secondary | ICD-10-CM | POA: Diagnosis not present

## 2013-11-26 DIAGNOSIS — M069 Rheumatoid arthritis, unspecified: Secondary | ICD-10-CM | POA: Diagnosis not present

## 2013-11-26 DIAGNOSIS — Z87891 Personal history of nicotine dependence: Secondary | ICD-10-CM | POA: Diagnosis not present

## 2013-11-26 DIAGNOSIS — I10 Essential (primary) hypertension: Secondary | ICD-10-CM | POA: Diagnosis not present

## 2013-11-26 DIAGNOSIS — F411 Generalized anxiety disorder: Secondary | ICD-10-CM | POA: Insufficient documentation

## 2013-11-26 DIAGNOSIS — E119 Type 2 diabetes mellitus without complications: Secondary | ICD-10-CM | POA: Insufficient documentation

## 2013-11-26 DIAGNOSIS — E785 Hyperlipidemia, unspecified: Secondary | ICD-10-CM | POA: Insufficient documentation

## 2013-11-26 DIAGNOSIS — Z5989 Other problems related to housing and economic circumstances: Secondary | ICD-10-CM | POA: Diagnosis not present

## 2013-11-26 DIAGNOSIS — Z5987 Material hardship due to limited financial resources, not elsewhere classified: Secondary | ICD-10-CM | POA: Insufficient documentation
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
## 2013-11-29 ENCOUNTER — Encounter (HOSPITAL_COMMUNITY): Payer: Self-pay | Admitting: Psychiatry

## 2013-11-29 ENCOUNTER — Other Ambulatory Visit (HOSPITAL_COMMUNITY): Payer: Medicare HMO | Admitting: Psychiatry

## 2013-11-29 DIAGNOSIS — F332 Major depressive disorder, recurrent severe without psychotic features: Secondary | ICD-10-CM

## 2013-11-29 DIAGNOSIS — F410 Panic disorder [episodic paroxysmal anxiety] without agoraphobia: Secondary | ICD-10-CM

## 2013-11-29 MED ORDER — CITALOPRAM HYDROBROMIDE 20 MG PO TABS: 20.0000 mg | ORAL_TABLET | Freq: Every day | ORAL | Status: DC
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
## 2013-11-29 NOTE — Progress Notes (Signed)
    Daily Group Progress Note  Program: IOP  Group Time: 9:00-10:30  Participation Level: Active  Behavioral Response: Appropriate  Type of Therapy:  Group Therapy  Summary of Progress: Pt. Met with case manager and psychiatrist.     Group Time: 10:30-12:00  Participation Level:  Active  Behavioral Response: Appropriate  Type of Therapy: Psycho-education Group  Summary of Progress: Pt. Met with case manager and psychiatrist.  [facility]

## 2013-11-29 NOTE — Addendum Note (Signed)
Addended by: Erin Sons D on: 11/29/2013 12:31 PM   Modules accepted: Orders

## 2013-11-30 ENCOUNTER — Other Ambulatory Visit (HOSPITAL_COMMUNITY): Payer: Medicare HMO | Admitting: Psychiatry

## 2013-11-30 DIAGNOSIS — F332 Major depressive disorder, recurrent severe without psychotic features: Secondary | ICD-10-CM

## 2013-12-01 ENCOUNTER — Other Ambulatory Visit (HOSPITAL_COMMUNITY): Payer: Medicare HMO | Admitting: Psychiatry

## 2013-12-01 ENCOUNTER — Encounter (HOSPITAL_COMMUNITY): Payer: Self-pay | Admitting: Psychiatry

## 2013-12-01 DIAGNOSIS — F332 Major depressive disorder, recurrent severe without psychotic features: Secondary | ICD-10-CM

## 2013-12-01 NOTE — Progress Notes (Signed)
    Daily Group Progress Note  Program: IOP  Group Time: 9:00-10:30  Participation Level: Active  Behavioral Response: Appropriate  Type of Therapy:  Group Therapy  Summary of Progress: Pt. Reported that she had a bad evening last night. Pt reports that her husband and daughter are her major stressors. Pt. Reports limited social supports, but finds comfort in her dogs.      Group Time: 10:30-12:00  Participation Level:  Active  Behavioral Response: Appropriate  Type of Therapy: Psycho-education Group  Summary of Progress: Pt. Participated in discussion and instruction on yoga grounding series.  Nancie Neas, COUNS

## 2013-12-02 ENCOUNTER — Other Ambulatory Visit (HOSPITAL_COMMUNITY): Payer: Medicare HMO | Admitting: Psychiatry

## 2013-12-02 ENCOUNTER — Encounter (HOSPITAL_COMMUNITY): Payer: Self-pay | Admitting: Psychiatry

## 2013-12-02 DIAGNOSIS — F332 Major depressive disorder, recurrent severe without psychotic features: Secondary | ICD-10-CM

## 2013-12-02 DIAGNOSIS — F410 Panic disorder [episodic paroxysmal anxiety] without agoraphobia: Secondary | ICD-10-CM
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
## 2013-12-02 NOTE — Progress Notes (Signed)
    Daily Group Progress Note  Program: IOP  Group Time: 9:00-10:30  Participation Level: Active  Behavioral Response: Appropriate  Type of Therapy:  Group Therapy  Summary of Progress: Pt. Reported that she was feeling better and that today was a good day. Pt. Reported that she had a good night last night and was able to sleep.     Group Time: 10:30-12:00  Participation Level:  Active  Behavioral Response: Appropriate  Type of Therapy: Psycho-education Group  Summary of Progress: Pt. Participated in yoga grounding sequence.  Nancie Neas, COUNS

## 2013-12-02 NOTE — Progress Notes (Signed)
    Daily Group Progress Note  Program: IOP  Group Time: 9:00-10:30  Participation Level: Active  Behavioral Response: Appropriate  Type of Therapy:  Group Therapy  Summary of Progress: Pt. Was alert and attentive.  Pt. Reported that she felt tired and that she did not sleep well last night. Pt. Reported that her mood was good, but was bothered by stomach upset.      Group Time: 10:30-12:00  Participation Level:  Active  Behavioral Response: Appropriate  Type of Therapy: Psycho-education Group  Summary of Progress: Pt. Participated in group facilitated by the mental health association.   Nancie Neas, COUNS

## 2013-12-03 ENCOUNTER — Other Ambulatory Visit (HOSPITAL_COMMUNITY): Payer: Medicare HMO | Admitting: Psychiatry

## 2013-12-03 DIAGNOSIS — F332 Major depressive disorder, recurrent severe without psychotic features: Secondary | ICD-10-CM | POA: Diagnosis not present

## 2013-12-03 NOTE — Progress Notes (Signed)
    Daily Group Progress Note  Program: IOP  Group Time: 9:00-10:30  Participation Level: Active  Behavioral Response: Appropriate  Type of Therapy:  Group Therapy  Summary of Progress: Pt. Reported that she was feeling "much better". Pt. Also reported that she was sleeping better and that her stomach was better since the previous day. Pt. Processed creating more rigid boundaries in relationships with her husband and daughter.      Group Time: 10:30-12:00  Participation Level:  Active  Behavioral Response: Appropriate  Type of Therapy: Psycho-education Group  Summary of Progress: Pt. Participated in discussion about creating healthy boundaries.   Nancie Neas, COUNS
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
## 2013-12-06 ENCOUNTER — Other Ambulatory Visit (HOSPITAL_COMMUNITY): Payer: Medicare HMO | Admitting: Psychiatry

## 2013-12-06 DIAGNOSIS — F332 Major depressive disorder, recurrent severe without psychotic features: Secondary | ICD-10-CM | POA: Diagnosis not present

## 2013-12-06 NOTE — Patient Instructions (Signed)
Patient requesting discharge today.  Will follow up with PCP (Dr. Maceo Pro) and christian counselor Tillie Fantasia).  Pt states she will schedule follow up appointments for both.  Encouraged support groups.
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
## 2013-12-06 NOTE — Progress Notes (Signed)
    Daily Group Progress Note  Program: IOP  Group Time: 2919-1660  Participation Level: Active  Behavioral Response: Appropriate  Type of Therapy:  Group Therapy  Summary of Progress: Pt arrived requesting discharge today.  States that her patient will be returning to his home today and she is his Actuary.  Patient states she is ready to work with her pastoral counselor on an individual basis.  "I have been practicing setting limits with my daughter and husband."     Group Time: 1045-1200  Participation Level:  Active  Behavioral Response: Appropriate and Sharing  Type of Therapy: Psycho-education Group  Summary of Progress: Discharge Planning: Focused on setting treatment goals for MH-IOP, assessing current resources; while exploring new resources, making a plan for keeping yourself safe while in IOP/Discharge and arranging an individual aftercare plan.

## 2013-12-07 ENCOUNTER — Other Ambulatory Visit (HOSPITAL_COMMUNITY): Payer: Medicare HMO

## 2013-12-08 ENCOUNTER — Other Ambulatory Visit (HOSPITAL_COMMUNITY): Payer: Medicare HMO
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
## 2013-12-09 ENCOUNTER — Other Ambulatory Visit (HOSPITAL_COMMUNITY): Payer: Medicare HMO

## 2013-12-10 ENCOUNTER — Other Ambulatory Visit (HOSPITAL_COMMUNITY): Payer: Medicare HMO

## 2013-12-13 ENCOUNTER — Other Ambulatory Visit (HOSPITAL_COMMUNITY): Payer: Medicare HMO

## 2013-12-14 ENCOUNTER — Other Ambulatory Visit (HOSPITAL_COMMUNITY): Payer: Medicare HMO

## 2013-12-15 ENCOUNTER — Other Ambulatory Visit (HOSPITAL_COMMUNITY): Payer: Medicare HMO

## 2013-12-16 ENCOUNTER — Other Ambulatory Visit (HOSPITAL_COMMUNITY): Payer: Medicare HMO

## 2013-12-17 ENCOUNTER — Other Ambulatory Visit (HOSPITAL_COMMUNITY): Payer: Medicare HMO

## 2013-12-20 ENCOUNTER — Other Ambulatory Visit (HOSPITAL_COMMUNITY): Payer: Medicare HMO

## 2013-12-21 ENCOUNTER — Other Ambulatory Visit (HOSPITAL_COMMUNITY): Payer: Medicare HMO

## 2013-12-22 ENCOUNTER — Other Ambulatory Visit (HOSPITAL_COMMUNITY): Payer: Medicare HMO

## 2013-12-23 ENCOUNTER — Other Ambulatory Visit (HOSPITAL_COMMUNITY): Payer: Medicare HMO

## 2013-12-24 ENCOUNTER — Other Ambulatory Visit (HOSPITAL_COMMUNITY): Payer: Medicare HMO

## 2013-12-27 ENCOUNTER — Other Ambulatory Visit (HOSPITAL_COMMUNITY): Payer: Medicare HMO

## 2013-12-28 ENCOUNTER — Other Ambulatory Visit (HOSPITAL_COMMUNITY): Payer: Medicare HMO

## 2013-12-29 ENCOUNTER — Other Ambulatory Visit (HOSPITAL_COMMUNITY): Payer: Medicare HMO

## 2013-12-30 ENCOUNTER — Other Ambulatory Visit (HOSPITAL_COMMUNITY): Payer: Medicare HMO

## 2013-12-30 ENCOUNTER — Ambulatory Visit (HOSPITAL_COMMUNITY): Payer: Self-pay | Admitting: Psychiatry

## 2013-12-31 ENCOUNTER — Other Ambulatory Visit (HOSPITAL_COMMUNITY): Payer: Medicare HMO

## 2014-01-03 ENCOUNTER — Other Ambulatory Visit (HOSPITAL_COMMUNITY): Payer: Medicare HMO

## 2014-01-04 ENCOUNTER — Other Ambulatory Visit (HOSPITAL_COMMUNITY): Payer: Medicare HMO

## 2014-01-05 ENCOUNTER — Other Ambulatory Visit (HOSPITAL_COMMUNITY): Payer: Medicare HMO

## 2014-01-06 ENCOUNTER — Other Ambulatory Visit (HOSPITAL_COMMUNITY): Payer: Medicare HMO

## 2014-03-01 ENCOUNTER — Other Ambulatory Visit: Payer: Self-pay | Admitting: Family Medicine

## 2014-03-01 DIAGNOSIS — S43422D Sprain of left rotator cuff capsule, subsequent encounter: Secondary | ICD-10-CM

## 2014-03-07 ENCOUNTER — Ambulatory Visit (INDEPENDENT_AMBULATORY_CARE_PROVIDER_SITE_OTHER): Payer: Medicare HMO

## 2014-03-07 DIAGNOSIS — M7552 Bursitis of left shoulder: Secondary | ICD-10-CM

## 2014-03-07 DIAGNOSIS — M7522 Bicipital tendinitis, left shoulder: Secondary | ICD-10-CM

## 2014-03-07 DIAGNOSIS — S43432A Superior glenoid labrum lesion of left shoulder, initial encounter: Secondary | ICD-10-CM

## 2014-03-07 DIAGNOSIS — X58XXXA Exposure to other specified factors, initial encounter: Secondary | ICD-10-CM

## 2014-03-07 DIAGNOSIS — S43422D Sprain of left rotator cuff capsule, subsequent encounter: Secondary | ICD-10-CM

## 2014-03-07 DIAGNOSIS — S46012A Strain of muscle(s) and tendon(s) of the rotator cuff of left shoulder, initial encounter: Secondary | ICD-10-CM

## 2014-03-07 DIAGNOSIS — M19012 Primary osteoarthritis, left shoulder: Secondary | ICD-10-CM

## 2014-03-23 DIAGNOSIS — S46002D Unspecified injury of muscle(s) and tendon(s) of the rotator cuff of left shoulder, subsequent encounter: Secondary | ICD-10-CM | POA: Diagnosis not present

## 2014-03-23 DIAGNOSIS — M25612 Stiffness of left shoulder, not elsewhere classified: Secondary | ICD-10-CM | POA: Diagnosis not present

## 2014-03-23 DIAGNOSIS — M25512 Pain in left shoulder: Secondary | ICD-10-CM | POA: Diagnosis not present

## 2014-03-23 DIAGNOSIS — M6281 Muscle weakness (generalized): Secondary | ICD-10-CM | POA: Diagnosis not present

## 2014-03-23 DIAGNOSIS — M25532 Pain in left wrist: Secondary | ICD-10-CM | POA: Diagnosis not present

## 2014-03-25 DIAGNOSIS — M67912 Unspecified disorder of synovium and tendon, left shoulder: Secondary | ICD-10-CM | POA: Diagnosis not present

## 2014-04-05 DIAGNOSIS — Z1231 Encounter for screening mammogram for malignant neoplasm of breast: Secondary | ICD-10-CM | POA: Diagnosis not present

## 2014-04-12 DIAGNOSIS — R928 Other abnormal and inconclusive findings on diagnostic imaging of breast: Secondary | ICD-10-CM | POA: Diagnosis not present

## 2014-05-26 DIAGNOSIS — M7522 Bicipital tendinitis, left shoulder: Secondary | ICD-10-CM | POA: Diagnosis not present

## 2014-05-26 DIAGNOSIS — F419 Anxiety disorder, unspecified: Secondary | ICD-10-CM | POA: Diagnosis not present

## 2014-05-26 DIAGNOSIS — M75112 Incomplete rotator cuff tear or rupture of left shoulder, not specified as traumatic: Secondary | ICD-10-CM | POA: Diagnosis not present

## 2015-01-03 DIAGNOSIS — M75112 Incomplete rotator cuff tear or rupture of left shoulder, not specified as traumatic: Secondary | ICD-10-CM | POA: Diagnosis not present

## 2015-01-03 DIAGNOSIS — M2042 Other hammer toe(s) (acquired), left foot: Secondary | ICD-10-CM | POA: Diagnosis not present

## 2015-01-03 DIAGNOSIS — M7522 Bicipital tendinitis, left shoulder: Secondary | ICD-10-CM | POA: Diagnosis not present

## 2015-01-03 DIAGNOSIS — K0510 Chronic gingivitis, plaque induced: Secondary | ICD-10-CM | POA: Diagnosis not present

## 2015-03-16 DIAGNOSIS — L309 Dermatitis, unspecified: Secondary | ICD-10-CM | POA: Diagnosis not present

## 2015-03-16 DIAGNOSIS — E039 Hypothyroidism, unspecified: Secondary | ICD-10-CM | POA: Diagnosis not present

## 2015-03-16 DIAGNOSIS — Z0000 Encounter for general adult medical examination without abnormal findings: Secondary | ICD-10-CM | POA: Diagnosis not present

## 2015-03-16 DIAGNOSIS — I10 Essential (primary) hypertension: Secondary | ICD-10-CM | POA: Diagnosis not present

## 2015-03-16 DIAGNOSIS — E785 Hyperlipidemia, unspecified: Secondary | ICD-10-CM | POA: Diagnosis not present

## 2015-03-16 DIAGNOSIS — S81802A Unspecified open wound, left lower leg, initial encounter: Secondary | ICD-10-CM | POA: Diagnosis not present

## 2015-03-24 DIAGNOSIS — Z780 Asymptomatic menopausal state: Secondary | ICD-10-CM | POA: Diagnosis not present

## 2015-03-24 DIAGNOSIS — M8580 Other specified disorders of bone density and structure, unspecified site: Secondary | ICD-10-CM | POA: Diagnosis not present

## 2015-04-12 DIAGNOSIS — M8580 Other specified disorders of bone density and structure, unspecified site: Secondary | ICD-10-CM | POA: Diagnosis not present

## 2015-04-12 DIAGNOSIS — Z780 Asymptomatic menopausal state: Secondary | ICD-10-CM | POA: Diagnosis not present

## 2015-04-12 DIAGNOSIS — Z1231 Encounter for screening mammogram for malignant neoplasm of breast: Secondary | ICD-10-CM | POA: Diagnosis not present

## 2015-04-14 DIAGNOSIS — M25512 Pain in left shoulder: Secondary | ICD-10-CM | POA: Diagnosis not present

## 2015-04-14 DIAGNOSIS — M7542 Impingement syndrome of left shoulder: Secondary | ICD-10-CM | POA: Diagnosis not present

## 2015-04-19 DIAGNOSIS — M9901 Segmental and somatic dysfunction of cervical region: Secondary | ICD-10-CM | POA: Diagnosis not present

## 2015-04-19 DIAGNOSIS — M25512 Pain in left shoulder: Secondary | ICD-10-CM | POA: Diagnosis not present

## 2015-04-19 DIAGNOSIS — M9902 Segmental and somatic dysfunction of thoracic region: Secondary | ICD-10-CM | POA: Diagnosis not present

## 2015-04-19 DIAGNOSIS — M50322 Other cervical disc degeneration at C5-C6 level: Secondary | ICD-10-CM | POA: Diagnosis not present

## 2015-04-20 DIAGNOSIS — M9901 Segmental and somatic dysfunction of cervical region: Secondary | ICD-10-CM | POA: Diagnosis not present

## 2015-04-20 DIAGNOSIS — M50322 Other cervical disc degeneration at C5-C6 level: Secondary | ICD-10-CM | POA: Diagnosis not present

## 2015-04-20 DIAGNOSIS — M25512 Pain in left shoulder: Secondary | ICD-10-CM | POA: Diagnosis not present

## 2015-04-20 DIAGNOSIS — M9902 Segmental and somatic dysfunction of thoracic region: Secondary | ICD-10-CM | POA: Diagnosis not present

## 2015-05-08 DIAGNOSIS — M7542 Impingement syndrome of left shoulder: Secondary | ICD-10-CM | POA: Diagnosis not present

## 2015-05-08 DIAGNOSIS — M25512 Pain in left shoulder: Secondary | ICD-10-CM | POA: Diagnosis not present

## 2015-05-11 DIAGNOSIS — M25512 Pain in left shoulder: Secondary | ICD-10-CM | POA: Diagnosis not present

## 2015-05-11 DIAGNOSIS — M7542 Impingement syndrome of left shoulder: Secondary | ICD-10-CM | POA: Diagnosis not present

## 2015-05-12 DIAGNOSIS — K219 Gastro-esophageal reflux disease without esophagitis: Secondary | ICD-10-CM | POA: Diagnosis not present

## 2015-05-12 DIAGNOSIS — M7550 Bursitis of unspecified shoulder: Secondary | ICD-10-CM | POA: Diagnosis not present

## 2015-05-16 DIAGNOSIS — M7542 Impingement syndrome of left shoulder: Secondary | ICD-10-CM | POA: Diagnosis not present

## 2015-05-16 DIAGNOSIS — M25512 Pain in left shoulder: Secondary | ICD-10-CM | POA: Diagnosis not present

## 2015-05-23 DIAGNOSIS — M25512 Pain in left shoulder: Secondary | ICD-10-CM | POA: Diagnosis not present

## 2015-05-23 DIAGNOSIS — M7542 Impingement syndrome of left shoulder: Secondary | ICD-10-CM | POA: Diagnosis not present

## 2015-05-25 DIAGNOSIS — M25512 Pain in left shoulder: Secondary | ICD-10-CM | POA: Diagnosis not present

## 2015-05-25 DIAGNOSIS — M7542 Impingement syndrome of left shoulder: Secondary | ICD-10-CM | POA: Diagnosis not present

## 2015-05-26 ENCOUNTER — Encounter: Payer: Self-pay | Admitting: Internal Medicine

## 2015-06-14 ENCOUNTER — Ambulatory Visit: Payer: Self-pay | Admitting: Internal Medicine

## 2015-06-29 DIAGNOSIS — M7581 Other shoulder lesions, right shoulder: Secondary | ICD-10-CM | POA: Diagnosis not present

## 2015-06-29 DIAGNOSIS — M7521 Bicipital tendinitis, right shoulder: Secondary | ICD-10-CM | POA: Diagnosis not present

## 2015-07-10 DIAGNOSIS — R350 Frequency of micturition: Secondary | ICD-10-CM | POA: Diagnosis not present

## 2015-07-10 DIAGNOSIS — K219 Gastro-esophageal reflux disease without esophagitis: Secondary | ICD-10-CM | POA: Diagnosis not present

## 2015-07-10 DIAGNOSIS — G5713 Meralgia paresthetica, bilateral lower limbs: Secondary | ICD-10-CM | POA: Diagnosis not present

## 2015-07-10 DIAGNOSIS — M25511 Pain in right shoulder: Secondary | ICD-10-CM | POA: Diagnosis not present

## 2015-09-15 ENCOUNTER — Ambulatory Visit: Payer: Self-pay | Admitting: Internal Medicine

## 2015-12-13 DIAGNOSIS — Z23 Encounter for immunization: Secondary | ICD-10-CM | POA: Diagnosis not present

## 2015-12-29 DIAGNOSIS — B070 Plantar wart: Secondary | ICD-10-CM | POA: Diagnosis not present

## 2015-12-29 DIAGNOSIS — L84 Corns and callosities: Secondary | ICD-10-CM | POA: Diagnosis not present

## 2016-01-31 DIAGNOSIS — H524 Presbyopia: Secondary | ICD-10-CM | POA: Diagnosis not present

## 2016-03-20 DIAGNOSIS — Z0000 Encounter for general adult medical examination without abnormal findings: Secondary | ICD-10-CM | POA: Diagnosis not present

## 2016-03-20 DIAGNOSIS — I10 Essential (primary) hypertension: Secondary | ICD-10-CM | POA: Diagnosis not present

## 2016-03-20 DIAGNOSIS — E039 Hypothyroidism, unspecified: Secondary | ICD-10-CM | POA: Diagnosis not present

## 2016-03-20 DIAGNOSIS — F329 Major depressive disorder, single episode, unspecified: Secondary | ICD-10-CM | POA: Diagnosis not present

## 2016-03-20 DIAGNOSIS — K219 Gastro-esophageal reflux disease without esophagitis: Secondary | ICD-10-CM | POA: Diagnosis not present

## 2016-03-20 DIAGNOSIS — R140 Abdominal distension (gaseous): Secondary | ICD-10-CM | POA: Diagnosis not present

## 2016-03-20 DIAGNOSIS — E785 Hyperlipidemia, unspecified: Secondary | ICD-10-CM | POA: Diagnosis not present

## 2016-03-20 DIAGNOSIS — Z79899 Other long term (current) drug therapy: Secondary | ICD-10-CM | POA: Diagnosis not present

## 2016-04-02 DIAGNOSIS — E538 Deficiency of other specified B group vitamins: Secondary | ICD-10-CM | POA: Diagnosis not present

## 2016-04-09 ENCOUNTER — Other Ambulatory Visit: Payer: Self-pay | Admitting: Gastroenterology

## 2016-04-09 DIAGNOSIS — R1012 Left upper quadrant pain: Secondary | ICD-10-CM

## 2016-04-09 DIAGNOSIS — E538 Deficiency of other specified B group vitamins: Secondary | ICD-10-CM | POA: Diagnosis not present

## 2016-04-09 DIAGNOSIS — R1011 Right upper quadrant pain: Secondary | ICD-10-CM

## 2016-04-17 ENCOUNTER — Ambulatory Visit
Admission: RE | Admit: 2016-04-17 | Discharge: 2016-04-17 | Disposition: A | Discharge status: Home / Self Care | Payer: Medicare HMO | Source: Ambulatory Visit | Attending: Gastroenterology | Admitting: Gastroenterology

## 2016-04-17 DIAGNOSIS — R1011 Right upper quadrant pain: Secondary | ICD-10-CM | POA: Diagnosis not present

## 2016-04-17 DIAGNOSIS — R1012 Left upper quadrant pain: Secondary | ICD-10-CM

## 2016-05-07 DIAGNOSIS — E538 Deficiency of other specified B group vitamins: Secondary | ICD-10-CM | POA: Diagnosis not present

## 2016-05-13 DIAGNOSIS — B070 Plantar wart: Secondary | ICD-10-CM | POA: Diagnosis not present

## 2016-05-29 DIAGNOSIS — L84 Corns and callosities: Secondary | ICD-10-CM | POA: Diagnosis not present

## 2016-05-29 DIAGNOSIS — E039 Hypothyroidism, unspecified: Secondary | ICD-10-CM | POA: Diagnosis not present

## 2016-05-29 DIAGNOSIS — R0602 Shortness of breath: Secondary | ICD-10-CM | POA: Diagnosis not present

## 2016-05-29 DIAGNOSIS — R5383 Other fatigue: Secondary | ICD-10-CM | POA: Diagnosis not present

## 2016-05-29 DIAGNOSIS — L989 Disorder of the skin and subcutaneous tissue, unspecified: Secondary | ICD-10-CM | POA: Diagnosis not present

## 2016-06-01 ENCOUNTER — Encounter (HOSPITAL_COMMUNITY): Payer: Self-pay | Admitting: Emergency Medicine

## 2016-06-01 ENCOUNTER — Emergency Department (HOSPITAL_COMMUNITY): Payer: Medicare HMO

## 2016-06-01 ENCOUNTER — Emergency Department (HOSPITAL_COMMUNITY)
Admission: EM | Admit: 2016-06-01 | Discharge: 2016-06-01 | Disposition: A | Discharge status: Home / Self Care | Payer: Medicare HMO | Attending: Emergency Medicine | Admitting: Emergency Medicine

## 2016-06-01 DIAGNOSIS — R5383 Other fatigue: Secondary | ICD-10-CM | POA: Diagnosis not present

## 2016-06-01 DIAGNOSIS — I10 Essential (primary) hypertension: Secondary | ICD-10-CM | POA: Diagnosis not present

## 2016-06-01 DIAGNOSIS — R531 Weakness: Secondary | ICD-10-CM | POA: Diagnosis not present

## 2016-06-01 DIAGNOSIS — R109 Unspecified abdominal pain: Secondary | ICD-10-CM | POA: Insufficient documentation

## 2016-06-01 DIAGNOSIS — R251 Tremor, unspecified: Secondary | ICD-10-CM | POA: Diagnosis not present

## 2016-06-01 DIAGNOSIS — E039 Hypothyroidism, unspecified: Secondary | ICD-10-CM | POA: Diagnosis not present

## 2016-06-01 DIAGNOSIS — R0602 Shortness of breath: Secondary | ICD-10-CM | POA: Diagnosis not present

## 2016-06-01 DIAGNOSIS — Z87891 Personal history of nicotine dependence: Secondary | ICD-10-CM | POA: Insufficient documentation

## 2016-06-01 DIAGNOSIS — Z7982 Long term (current) use of aspirin: Secondary | ICD-10-CM | POA: Diagnosis not present

## 2016-06-01 DIAGNOSIS — Z79899 Other long term (current) drug therapy: Secondary | ICD-10-CM | POA: Diagnosis not present

## 2016-06-01 HISTORY — DX: Transient cerebral ischemic attack, unspecified: G45.9

## 2016-06-01 LAB — CBC WITH DIFFERENTIAL/PLATELET
BASOS ABS: 0 10*3/uL (ref 0.0–0.1)
BASOS PCT: 0 %
Eosinophils Absolute: 0.2 10*3/uL (ref 0.0–0.7)
Eosinophils Relative: 2 %
HEMATOCRIT: 44.2 % (ref 36.0–46.0)
Hemoglobin: 15.2 g/dL — ABNORMAL HIGH (ref 12.0–15.0)
Lymphocytes Relative: 36 %
Lymphs Abs: 3.3 10*3/uL (ref 0.7–4.0)
MCH: 32.5 pg (ref 26.0–34.0)
MCHC: 34.4 g/dL (ref 30.0–36.0)
MCV: 94.4 fL (ref 78.0–100.0)
MONO ABS: 0.6 10*3/uL (ref 0.1–1.0)
Monocytes Relative: 6 %
NEUTROS ABS: 5.1 10*3/uL (ref 1.7–7.7)
Neutrophils Relative %: 56 %
PLATELETS: 194 10*3/uL (ref 150–400)
RBC: 4.68 MIL/uL (ref 3.87–5.11)
RDW: 12.9 % (ref 11.5–15.5)
WBC: 9.2 10*3/uL (ref 4.0–10.5)

## 2016-06-01 LAB — COMPREHENSIVE METABOLIC PANEL
ALBUMIN: 4.4 g/dL (ref 3.5–5.0)
ALT: 33 U/L (ref 14–54)
AST: 25 U/L (ref 15–41)
Alkaline Phosphatase: 71 U/L (ref 38–126)
Anion gap: 9 (ref 5–15)
BILIRUBIN TOTAL: 0.7 mg/dL (ref 0.3–1.2)
BUN: 16 mg/dL (ref 6–20)
CALCIUM: 9.5 mg/dL (ref 8.9–10.3)
CHLORIDE: 101 mmol/L (ref 101–111)
CO2: 24 mmol/L (ref 22–32)
CREATININE: 0.91 mg/dL (ref 0.44–1.00)
GFR calc Af Amer: >60 mL/min (ref >60)
Glucose, Bld: 100 mg/dL — ABNORMAL HIGH (ref 65–99)
Potassium: 3.9 mmol/L (ref 3.5–5.1)
Sodium: 134 mmol/L — ABNORMAL LOW (ref 135–145)
TOTAL PROTEIN: 7.3 g/dL (ref 6.5–8.1)

## 2016-06-01 LAB — URINALYSIS, ROUTINE W REFLEX MICROSCOPIC
Bilirubin Urine: NEGATIVE
Glucose, UA: NEGATIVE mg/dL
Hgb urine dipstick: NEGATIVE
Ketones, ur: NEGATIVE mg/dL
LEUKOCYTES UA: NEGATIVE
NITRITE: NEGATIVE
Protein, ur: NEGATIVE mg/dL
SPECIFIC GRAVITY, URINE: 1.003 — AB (ref 1.005–1.030)
pH: 6 (ref 5.0–8.0)

## 2016-06-01 LAB — T4, FREE: Free T4: 1.1 ng/dL (ref 0.61–1.12)

## 2016-06-01 LAB — MAGNESIUM: Magnesium: 2.2 mg/dL (ref 1.7–2.4)

## 2016-06-01 LAB — TROPONIN I

## 2016-06-01 LAB — D-DIMER, QUANTITATIVE: D-Dimer, Quant: 0.41 ug/mL-FEU (ref 0.00–0.50)

## 2016-06-01 LAB — BRAIN NATRIURETIC PEPTIDE: B Natriuretic Peptide: 28 pg/mL (ref 0.0–100.0)

## 2016-06-01 LAB — TSH: TSH: 3.738 u[IU]/mL (ref 0.350–4.500)

## 2016-06-01 MED ORDER — SODIUM CHLORIDE 0.9 % IV BOLUS (SEPSIS)
500.0000 mL | Freq: Once | INTRAVENOUS | Status: AC
  Administered 2016-06-01: 500 mL via INTRAVENOUS

## 2016-06-01 MED ORDER — THIAMINE HCL 100 MG/ML IJ SOLN
100.0000 mg | Freq: Once | INTRAMUSCULAR | Status: AC
  Administered 2016-06-01: 100 mg via INTRAVENOUS
  Filled 2016-06-01: qty 2

## 2016-06-01 MED ORDER — VITAMIN B-1 100 MG PO TABS: 100.0000 mg | ORAL_TABLET | Freq: Every day | ORAL | 0 refills | Status: DC

## 2016-06-01 NOTE — ED Notes (Signed)
Pt taken to XR.  

## 2016-06-01 NOTE — ED Provider Notes (Signed)
[facility] DEPT Provider Note   CSN: [id number] Arrival date & time: 06/01/16  1326     History   Chief Complaint Chief Complaint  Patient presents with  . Weakness    HPI [name] is a 71 y.o. female.  HPI Patient percent with 2 weeks of progressive weakness and fatigue. She has accompanying shortness of breath is especially worse with lying flat. Denies any cough, fever or chills. Patient has noticed worsening in her baseline tremor. She's had changes in her sleep. Difficulty falling asleep. No appetite changes. Recently started on PPI. Denies any melena or grossly bloody stools. No other medication changes. No urinary symptoms. Has intermittent tingling to her bilateral upper and lower extremities and color changes to her toes. Past Medical History:  Diagnosis Date  . Anxiety   . Depression   . Hyperlipemia   . Hypertension   . Thyroid disease   . TIA (transient ischemic attack)     Patient Active Problem List   Diagnosis Date Noted  . Numbness and tingling in left arm 05/24/2012  . Numbness of face 05/24/2012  . Unspecified hypothyroidism 05/24/2012  . Alcohol abuse, daily use 05/24/2012  . Hypertension   . Hyperlipemia   . Depression   . Anxiety     Past Surgical History:  Procedure Laterality Date  . ABDOMINAL HYSTERECTOMY    . BREAST BIOPSY    . TUBAL LIGATION      OB History    No data available       Home Medications    Prior to Admission medications   Medication Sig Start Date End Date Taking? Authorizing Provider  amLODipine (NORVASC) 10 MG tablet Take 10 mg by mouth at bedtime.   Yes Historical Provider, MD  aspirin EC 81 MG tablet Take 81 mg by mouth daily.   Yes Historical Provider, MD  cyanocobalamin (,VITAMIN B-12,) 1000 MCG/ML injection Inject 1,000 mcg into the muscle every 30 (thirty) days.   Yes Historical Provider, MD  famotidine (PEPCID) 20 MG tablet Take 20 mg by mouth daily.   Yes Historical Provider, MD    levothyroxine (SYNTHROID, LEVOTHROID) 100 MCG tablet Take 100 mcg by mouth daily before breakfast.    Yes Historical Provider, MD  thiamine (VITAMIN B-1) 100 MG tablet Take 1 tablet (100 mg total) by mouth daily. 06/01/16   Julianne Rice, MD    Family History Family History  Problem Relation Age of Onset  . Alcohol abuse Brother   . Diabetes Mother   . Dementia Mother   . COPD Father   . Heart attack Daughter     Social History Social History  Substance Use Topics  . Smoking status: Former Smoker    Quit date: 05/25/1987  . Smokeless tobacco: Never Used  . Alcohol use Yes     Comment: 2 glasses of wine daily     Allergies   Penicillins   Review of Systems Review of Systems  Constitutional: Positive for fatigue. Negative for appetite change, chills and fever.  HENT: Negative for congestion and trouble swallowing.   Eyes: Negative for visual disturbance.  Respiratory: Positive for shortness of breath. Negative for cough and chest tightness.   Cardiovascular: Negative for chest pain, palpitations and leg swelling.  Gastrointestinal: Positive for abdominal pain. Negative for constipation, diarrhea and nausea.  Genitourinary: Negative for dysuria, flank pain and frequency.  Musculoskeletal: Positive for arthralgias and myalgias. Negative for back pain, neck pain and neck stiffness.  Skin: Negative for rash  and wound.  Neurological: Positive for tremors and weakness. Negative for dizziness, syncope, light-headedness, numbness and headaches.  Psychiatric/Behavioral: Positive for sleep disturbance.  All other systems reviewed and are negative.    Physical Exam Updated Vital Signs BP 126/70   Pulse 75   Temp 98 F (36.7 C) (Oral)   Resp 15   Ht 5' 5.5" (1.664 m)   Wt 190 lb (86.2 kg)   SpO2 98%   BMI 31.14 kg/m   Physical Exam  Constitutional: She is oriented to person, place, and time. She appears well-developed and well-nourished. No distress.  HENT:  Head:  Normocephalic and atraumatic.  Mouth/Throat: Oropharynx is clear and moist.  Eyes: EOM are normal. Pupils are equal, round, and reactive to light.  Fatigable rotary nystagmus  Neck: Normal range of motion. Neck supple. No thyromegaly present.  Cardiovascular: Normal rate and regular rhythm.  Exam reveals no gallop and no friction rub.   No murmur heard. Pulmonary/Chest: Effort normal and breath sounds normal. No respiratory distress. She has no wheezes. She has no rales. She exhibits no tenderness.  Abdominal: Soft. Bowel sounds are normal. There is no tenderness. There is no rebound and no guarding.  Musculoskeletal: Normal range of motion. She exhibits no edema or tenderness.  No midline thoracic or lumbar tenderness. No CVA tenderness. 2+ dorsalis pedis and posterior tibial pulses. No lower extremity swelling, asymmetry or tenderness.  Neurological: She is alert and oriented to person, place, and time.  Patient is alert and oriented x3 with clear, goal oriented speech. Patient has 5/5 motor in all extremities. Sensation is intact to light touch. Bilateral finger-to-nose is normal with no signs of dysmetria. Patient has generalized tremor. No cogwheel rigidity.  Skin: Skin is warm and dry. No rash noted. No erythema.  Patient has small lesion to the medial surface of the fifth digit of the right toe. Roughly 1 cm in diameter. No erythema or purulent discharge.  Psychiatric: She has a normal mood and affect. Her behavior is normal.  Nursing note and vitals reviewed.    ED Treatments / Results  Labs (all labs ordered are listed, but only abnormal results are displayed) Labs Reviewed  CBC WITH DIFFERENTIAL/PLATELET - Abnormal; Notable for the following:       Result Value   Hemoglobin 15.2 (*)    All other components within normal limits  COMPREHENSIVE METABOLIC PANEL - Abnormal; Notable for the following:    Sodium 134 (*)    Glucose, Bld 100 (*)    All other components within normal  limits  URINALYSIS, ROUTINE W REFLEX MICROSCOPIC - Abnormal; Notable for the following:    Color, Urine STRAW (*)    Specific Gravity, Urine 1.003 (*)    All other components within normal limits  TSH  TROPONIN I  BRAIN NATRIURETIC PEPTIDE  D-DIMER, QUANTITATIVE (NOT AT [facility])  MAGNESIUM  T4, FREE    EKG  EKG Interpretation  Date/Time:  Saturday June 01 2016 15:32:10 EDT Ventricular Rate:  81 PR Interval:    QRS Duration: 81 QT Interval:  378 QTC Calculation: 439 R Axis:   51 Text Interpretation:  Sinus rhythm Confirmed by Lita Mains  MD, Darsi Tien ([id number]) on 06/01/2016 7:14:25 PM       Radiology Dg Chest 2 View  Result Date: 06/01/2016 CLINICAL DATA:  SOB, WEAKNESS, Pt reports ongoing generalized weakness, fatigue, SOB, and tremors for over a month. States she went PCP recently and was told she had vitamin B12 deficiency. Reports over the  past week symptoms have gotten worse HISTORY HTN, TIA EXAM: CHEST  2 VIEW COMPARISON:  05/29/2016 FINDINGS: Normal mediastinum and cardiac silhouette. Normal pulmonary vasculature. No evidence of effusion, infiltrate, or pneumothorax. No acute bony abnormality. Hyperinflated lungs. IMPRESSION: Hyperinflated lungs.  No acute findings Electronically Signed   By: Suzy Bouchard M.D.   On: 06/01/2016 16:18   Ct Head Wo Contrast  Result Date: 06/01/2016 CLINICAL DATA:  Generalized weakness, fatigue and tremors. EXAM: CT HEAD WITHOUT CONTRAST TECHNIQUE: Contiguous axial images were obtained from the base of the skull through the vertex without intravenous contrast. COMPARISON:  CT of the head 05/23/2012 FINDINGS: Brain: No evidence of acute infarction, hemorrhage, hydrocephalus, extra-axial collection or mass lesion/mass effect. Vascular: No hyperdense vessel or unexpected calcification. Skull: Normal. Negative for fracture or focal lesion. Sinuses/Orbits: No acute finding. Other: None. IMPRESSION: No acute intracranial abnormality. Electronically Signed    By: Fidela Salisbury M.D.   On: 06/01/2016 17:49    Procedures Procedures (including critical care time)  Medications Ordered in ED Medications  thiamine (B-1) injection 100 mg (100 mg Intravenous Given 06/01/16 1655)  sodium chloride 0.9 % bolus 500 mL (0 mLs Intravenous Stopped 06/01/16 1838)     Initial Impression / Assessment and Plan / ED Course  I have reviewed the triage vital signs and the nursing notes.  Pertinent labs & imaging results that were available during my care of the patient were reviewed by me and considered in my medical decision making (see chart for details).     Patient states she is feeling better after IV fluids and dose of IV thiamine. There her symptoms may be related to thiamine deficiency. Patient says she does drink several glasses of wine daily. She appears clinically stable at this time. She has follow-up appointment with her primary physician and will give follow-up with neurology. We'll start on oral vitamin replacement. Understands need to return immediately for any worsening of her symptoms or any concerns.  Final Clinical Impressions(s) / ED Diagnoses   Final diagnoses:  Generalized weakness    New Prescriptions New Prescriptions   THIAMINE (VITAMIN B-1) 100 MG TABLET    Take 1 tablet (100 mg total) by mouth daily.     Julianne Rice, MD 06/01/16 [phone]

## 2016-06-01 NOTE — ED Notes (Signed)
Pt assisted to bathroom, placed back on monitor, given crackers and peanut butter with water.

## 2016-06-01 NOTE — ED Notes (Signed)
Pt ambulated to restroom with standby assist, without difficulty, steady gait.

## 2016-06-01 NOTE — ED Triage Notes (Signed)
Pt reports ongoing generalized weakness, fatigue, SOB, and tremors for over a month. States she went PCP recently and was told she had vitamin B12 deficiency. Reports over the past week symptoms have gotten worse.

## 2016-06-01 NOTE — ED Notes (Signed)
EKG given to Dr. Yelverton 

## 2016-06-01 NOTE — Discharge Instructions (Signed)
Make appointment to follow-up with her primary physician and with neurology. Sure you're drinking plenty of fluids. Return immediately for any worsening of your symptoms or concerns.

## 2016-06-04 ENCOUNTER — Emergency Department (HOSPITAL_COMMUNITY)
Admission: EM | Admit: 2016-06-04 | Discharge: 2016-06-04 | Disposition: A | Discharge status: Home / Self Care | Payer: Medicare HMO | Attending: Emergency Medicine | Admitting: Emergency Medicine

## 2016-06-04 ENCOUNTER — Encounter (HOSPITAL_COMMUNITY): Payer: Self-pay

## 2016-06-04 DIAGNOSIS — I10 Essential (primary) hypertension: Secondary | ICD-10-CM | POA: Insufficient documentation

## 2016-06-04 DIAGNOSIS — R112 Nausea with vomiting, unspecified: Secondary | ICD-10-CM

## 2016-06-04 DIAGNOSIS — R197 Diarrhea, unspecified: Secondary | ICD-10-CM | POA: Diagnosis not present

## 2016-06-04 DIAGNOSIS — Z79899 Other long term (current) drug therapy: Secondary | ICD-10-CM | POA: Diagnosis not present

## 2016-06-04 DIAGNOSIS — Z87891 Personal history of nicotine dependence: Secondary | ICD-10-CM | POA: Diagnosis not present

## 2016-06-04 DIAGNOSIS — Z7982 Long term (current) use of aspirin: Secondary | ICD-10-CM | POA: Insufficient documentation

## 2016-06-04 DIAGNOSIS — R6883 Chills (without fever): Secondary | ICD-10-CM | POA: Insufficient documentation

## 2016-06-04 HISTORY — DX: Unspecified osteoarthritis, unspecified site: M19.90

## 2016-06-04 LAB — COMPREHENSIVE METABOLIC PANEL
ALK PHOS: 53 U/L (ref 38–126)
ALT: 34 U/L (ref 14–54)
AST: 34 U/L (ref 15–41)
Albumin: 3.8 g/dL (ref 3.5–5.0)
Anion gap: 7 (ref 5–15)
BUN: 14 mg/dL (ref 6–20)
CALCIUM: 8.5 mg/dL — AB (ref 8.9–10.3)
CO2: 25 mmol/L (ref 22–32)
CREATININE: 0.97 mg/dL (ref 0.44–1.00)
Chloride: 103 mmol/L (ref 101–111)
GFR, EST NON AFRICAN AMERICAN: 57 mL/min — AB (ref >60)
Glucose, Bld: 125 mg/dL — ABNORMAL HIGH (ref 65–99)
Potassium: 4.3 mmol/L (ref 3.5–5.1)
Sodium: 135 mmol/L (ref 135–145)
Total Bilirubin: 0.7 mg/dL (ref 0.3–1.2)
Total Protein: 6.3 g/dL — ABNORMAL LOW (ref 6.5–8.1)

## 2016-06-04 LAB — URINALYSIS, ROUTINE W REFLEX MICROSCOPIC
Bilirubin Urine: NEGATIVE
Glucose, UA: NEGATIVE mg/dL
Hgb urine dipstick: NEGATIVE
KETONES UR: NEGATIVE mg/dL
LEUKOCYTES UA: NEGATIVE
Nitrite: NEGATIVE
PROTEIN: NEGATIVE mg/dL
Specific Gravity, Urine: 1.011 (ref 1.005–1.030)
pH: 6 (ref 5.0–8.0)

## 2016-06-04 LAB — CBC
HCT: 46.1 % — ABNORMAL HIGH (ref 36.0–46.0)
Hemoglobin: 15.8 g/dL — ABNORMAL HIGH (ref 12.0–15.0)
MCH: 32.2 pg (ref 26.0–34.0)
MCHC: 34.3 g/dL (ref 30.0–36.0)
MCV: 94.1 fL (ref 78.0–100.0)
PLATELETS: 175 10*3/uL (ref 150–400)
RBC: 4.9 MIL/uL (ref 3.87–5.11)
RDW: 12.8 % (ref 11.5–15.5)
WBC: 5.7 10*3/uL (ref 4.0–10.5)

## 2016-06-04 LAB — LIPASE, BLOOD: Lipase: 23 U/L (ref 11–51)

## 2016-06-04 MED ORDER — ONDANSETRON 4 MG PO TBDP: ORAL_TABLET | ORAL | 0 refills | Status: DC

## 2016-06-04 MED ORDER — DICYCLOMINE HCL 20 MG PO TABS: ORAL_TABLET | ORAL | 0 refills | Status: DC

## 2016-06-04 MED ORDER — SODIUM CHLORIDE 0.9 % IV BOLUS (SEPSIS)
1000.0000 mL | Freq: Once | INTRAVENOUS | Status: AC
  Administered 2016-06-04: 1000 mL via INTRAVENOUS

## 2016-06-04 MED ORDER — ONDANSETRON HCL 4 MG/2ML IJ SOLN
4.0000 mg | Freq: Once | INTRAMUSCULAR | Status: AC
  Administered 2016-06-04: 4 mg via INTRAVENOUS
  Filled 2016-06-04: qty 2

## 2016-06-04 NOTE — ED Notes (Signed)
Pt states she feels much better at this time.

## 2016-06-04 NOTE — ED Notes (Signed)
Pt and SO states understanding of d/c instructions, follow up care, and prescription use. Denies further questions or concerns at this time. Ambulatory to d/c.

## 2016-06-04 NOTE — ED Triage Notes (Signed)
Pt reports that her upper abdomen started hurting last night. Began vomiting and diarrhea approx midnight. Vomited x 5 and loose stool has 5. Reports eating sour cream on a potatoe

## 2016-06-04 NOTE — ED Notes (Signed)
Pt states she is unable to provide UA at this time.   

## 2016-06-04 NOTE — Discharge Instructions (Signed)
Follow up with your md next week if not improving.  Drink plenty of fluids

## 2016-06-04 NOTE — ED Provider Notes (Signed)
[facility] DEPT Provider Note   CSN: [id number] Arrival date & time: 06/04/16  9629   By signing my name below, I, Hilbert Odor, attest that this documentation has been prepared under the direction and in the presence of Milton Ferguson, MD. Electronically Signed: Hilbert Odor, Scribe. 06/04/16. 8:18 AM. History   Chief Complaint Chief Complaint  Patient presents with  . Emesis   HPI Comments: [name] is a 71 y.o. female who presents to the Emergency Department complaining of multiple episodes of emesis that began around 6 pm last night. The patient also reports multiple episodes of diarrhea. She states that she noticed a trace of blood in her vomit after a few episodes but not at first. She thinks that this blood could be coming form the inside of her mouth. She denies any blood in stool. She also reports chills but no fever. She denies any known sick contact.  The history is provided by the patient. No language interpreter was used.  Emesis   This is a new problem. The current episode started 12 to 24 hours ago. The problem occurs 2 to 4 times per day. The problem has been gradually worsening. There has been no fever. Associated symptoms include chills and diarrhea. Pertinent negatives include no abdominal pain, no cough, no fever and no headaches.     Past Medical History:  Diagnosis Date  . Anxiety   . Arthritis   . Depression   . Hyperlipemia   . Hypertension   . Thyroid disease   . TIA (transient ischemic attack)     Patient Active Problem List   Diagnosis Date Noted  . Numbness and tingling in left arm 05/24/2012  . Numbness of face 05/24/2012  . Unspecified hypothyroidism 05/24/2012  . Alcohol abuse, daily use 05/24/2012  . Hypertension   . Hyperlipemia   . Depression   . Anxiety     Past Surgical History:  Procedure Laterality Date  . ABDOMINAL HYSTERECTOMY    . BREAST BIOPSY    . TUBAL LIGATION      OB History    No data available       Home Medications    Prior to Admission medications   Medication Sig Start Date End Date Taking? Authorizing Provider  amLODipine (NORVASC) 10 MG tablet Take 10 mg by mouth at bedtime.    Historical Provider, MD  aspirin EC 81 MG tablet Take 81 mg by mouth daily.    Historical Provider, MD  cyanocobalamin (,VITAMIN B-12,) 1000 MCG/ML injection Inject 1,000 mcg into the muscle every 30 (thirty) days.    Historical Provider, MD  famotidine (PEPCID) 20 MG tablet Take 20 mg by mouth daily.    Historical Provider, MD  levothyroxine (SYNTHROID, LEVOTHROID) 100 MCG tablet Take 100 mcg by mouth daily before breakfast.     Historical Provider, MD  thiamine (VITAMIN B-1) 100 MG tablet Take 1 tablet (100 mg total) by mouth daily. 06/01/16   Julianne Rice, MD    Family History Family History  Problem Relation Age of Onset  . Alcohol abuse Brother   . Diabetes Mother   . Dementia Mother   . COPD Father   . Heart attack Daughter     Social History Social History  Substance Use Topics  . Smoking status: Former Smoker    Quit date: 05/25/1987  . Smokeless tobacco: Never Used  . Alcohol use Yes     Comment: 2 glasses of wine daily     Allergies  Penicillins   Review of Systems Review of Systems  Constitutional: Positive for chills. Negative for appetite change, fatigue and fever.  HENT: Negative for congestion, ear discharge and sinus pressure.   Eyes: Negative for discharge.  Respiratory: Negative for cough.   Cardiovascular: Negative for chest pain.  Gastrointestinal: Positive for diarrhea and vomiting. Negative for abdominal pain and blood in stool.  Genitourinary: Negative for frequency and hematuria.  Musculoskeletal: Negative for back pain.  Skin: Negative for rash.  Neurological: Negative for seizures and headaches.  Psychiatric/Behavioral: Negative for hallucinations.  All other systems reviewed and are negative.    Physical Exam Updated Vital Signs BP 138/87 (BP  Location: Right Arm)   Pulse (!) 102   Temp 98.8 F (37.1 C) (Oral)   Resp 18   Ht 5\' 5"  (1.651 m)   Wt 190 lb (86.2 kg)   SpO2 97%   BMI 31.62 kg/m   Physical Exam  Constitutional: She is oriented to person, place, and time. She appears well-developed.  HENT:  Head: Normocephalic.  Mouth/Throat: Mucous membranes are dry.  Mucous membranes are a little dry.  Eyes: Conjunctivae and EOM are normal. No scleral icterus.  Neck: Neck supple. No thyromegaly present.  Cardiovascular: Normal rate and regular rhythm.  Exam reveals no gallop and no friction rub.   No murmur heard. Pulmonary/Chest: No stridor. She has no wheezes. She has no rales. She exhibits no tenderness.  Abdominal: She exhibits no distension. There is no tenderness. There is no rebound.  Musculoskeletal: Normal range of motion. She exhibits no edema.  Lymphadenopathy:    She has no cervical adenopathy.  Neurological: She is alert and oriented to person, place, and time. She exhibits normal muscle tone. Coordination normal.  Skin: No rash noted. No erythema.  Psychiatric: She has a normal mood and affect. Her behavior is normal.  Nursing note and vitals reviewed.    ED Treatments / Results  DIAGNOSTIC STUDIES: Oxygen Saturation is 97% on RA, normal by my interpretation.    COORDINATION OF CARE: 8:06 AM Discussed treatment plan with pt at bedside and pt agreed to plan. I will check the patient's labs.  Labs (all labs ordered are listed, but only abnormal results are displayed) Labs Reviewed  LIPASE, BLOOD  COMPREHENSIVE METABOLIC PANEL  CBC  URINALYSIS, ROUTINE W REFLEX MICROSCOPIC    EKG  EKG Interpretation None       Radiology No results found.  Procedures Procedures (including critical care time)  Medications Ordered in ED Medications  sodium chloride 0.9 % bolus 1,000 mL (not administered)  ondansetron (ZOFRAN) injection 4 mg (not administered)     Initial Impression / Assessment and  Plan / ED Course  I have reviewed the triage vital signs and the nursing notes.  Pertinent labs & imaging results that were available during my care of the patient were reviewed by me and considered in my medical decision making (see chart for details).   pt improved with tx.  Dx gastroenteritis.  tx with zofran and bentyl    Final Clinical Impressions(s) / ED Diagnoses   Final diagnoses:  None    New Prescriptions New Prescriptions   No medications on file   The chart was scribed for me under my direct supervision.  I personally performed the history, physical, and medical decision making and all procedures in the evaluation of this patient.Milton Ferguson, MD 06/04/16 1215

## 2016-06-05 ENCOUNTER — Other Ambulatory Visit: Payer: Self-pay | Admitting: Gastroenterology

## 2016-06-05 DIAGNOSIS — R112 Nausea with vomiting, unspecified: Secondary | ICD-10-CM | POA: Diagnosis not present

## 2016-06-05 DIAGNOSIS — R1084 Generalized abdominal pain: Secondary | ICD-10-CM

## 2016-06-10 ENCOUNTER — Other Ambulatory Visit: Payer: Self-pay

## 2016-06-10 ENCOUNTER — Ambulatory Visit
Admission: RE | Admit: 2016-06-10 | Discharge: 2016-06-10 | Disposition: A | Discharge status: Home / Self Care | Payer: Medicare HMO | Source: Ambulatory Visit | Attending: Gastroenterology | Admitting: Gastroenterology

## 2016-06-10 DIAGNOSIS — K449 Diaphragmatic hernia without obstruction or gangrene: Secondary | ICD-10-CM | POA: Diagnosis not present

## 2016-06-10 DIAGNOSIS — R1084 Generalized abdominal pain: Secondary | ICD-10-CM

## 2016-06-11 DIAGNOSIS — M2041 Other hammer toe(s) (acquired), right foot: Secondary | ICD-10-CM | POA: Diagnosis not present

## 2016-06-11 DIAGNOSIS — M2011 Hallux valgus (acquired), right foot: Secondary | ICD-10-CM | POA: Diagnosis not present

## 2016-06-11 DIAGNOSIS — L84 Corns and callosities: Secondary | ICD-10-CM | POA: Diagnosis not present

## 2016-06-11 DIAGNOSIS — M2042 Other hammer toe(s) (acquired), left foot: Secondary | ICD-10-CM | POA: Diagnosis not present

## 2016-06-11 DIAGNOSIS — M79674 Pain in right toe(s): Secondary | ICD-10-CM | POA: Diagnosis not present

## 2016-06-13 ENCOUNTER — Encounter: Payer: Self-pay | Admitting: Neurology

## 2016-06-20 ENCOUNTER — Encounter: Payer: Self-pay | Admitting: Neurology

## 2016-06-27 DIAGNOSIS — Z1231 Encounter for screening mammogram for malignant neoplasm of breast: Secondary | ICD-10-CM | POA: Diagnosis not present

## 2016-07-09 DIAGNOSIS — Z1159 Encounter for screening for other viral diseases: Secondary | ICD-10-CM | POA: Diagnosis not present

## 2016-07-09 DIAGNOSIS — Z0000 Encounter for general adult medical examination without abnormal findings: Secondary | ICD-10-CM | POA: Diagnosis not present

## 2016-07-09 DIAGNOSIS — L84 Corns and callosities: Secondary | ICD-10-CM | POA: Diagnosis not present

## 2016-07-09 DIAGNOSIS — E538 Deficiency of other specified B group vitamins: Secondary | ICD-10-CM | POA: Diagnosis not present

## 2016-07-09 DIAGNOSIS — R0602 Shortness of breath: Secondary | ICD-10-CM | POA: Diagnosis not present

## 2016-07-09 DIAGNOSIS — E039 Hypothyroidism, unspecified: Secondary | ICD-10-CM | POA: Diagnosis not present

## 2016-07-09 DIAGNOSIS — I10 Essential (primary) hypertension: Secondary | ICD-10-CM | POA: Diagnosis not present

## 2016-07-09 DIAGNOSIS — L989 Disorder of the skin and subcutaneous tissue, unspecified: Secondary | ICD-10-CM | POA: Diagnosis not present

## 2016-07-09 DIAGNOSIS — R5383 Other fatigue: Secondary | ICD-10-CM | POA: Diagnosis not present

## 2016-07-11 NOTE — Progress Notes (Signed)
Subjective:   [name] was seen in consultation in the movement disorder clinic at the request of Corine Shelter, PA-C at  [employer] [facility] [employer].  The evaluation is for tremor.   The records that were made available to me were reviewed.  Pt in ER on 06/01/16 regarding c/o tremor.  ER notes indicate patient drinks several glasses of wine daily.  She was given IV thiamine in the ER and reported that she felt better after that.  TSH was done in the ER and was 3.738.    Pt reports that tremor started approximately 5-10 years ago and involves the head/neck. She states that she has gotten used to it.  Anger/emotion makes it worse.  She isn't sure if it is worse when head is in one position vs another.     More recently she noted tremor in the L hand with use.  The R shakes some as well.  The tremor in the hands is with use.   There is a family hx of tremor in mom/sister/brother and they had tremor in the hands (mom may have had tremor in the head).    Affected by caffeine:  Yes.   (quit drinking because it made it worse) Affected by alcohol:  Yes.   (3 + glasses of wine per day) Affected by stress:  Yes.   Affected by fatigue:  Yes.   Spills soup if on spoon:  No. Spills glass of liquid if full:  No. Affects ADL's (tying shoes, brushing teeth, etc):  Yes.   (some trouble but hands with arthritis so difficutly with fine motor coordination/tying/buttoning)  Current/Previously tried tremor medications: n/a (on xanax - one time per day in the AM)  Current medications that may exacerbate tremor:  n/a  States that she has hx of B12 deficiency and is on injection but still is "low."  States that also has B1 deficiency.    Outside reports reviewed: historical medical records, lab reports, radiology reports and referral letter/letters.  Allergies  Allergen Reactions  . Penicillins Hives and Other (See Comments)    Has patient had a PCN reaction causing immediate rash,  facial/tongue/throat swelling, SOB or lightheadedness with hypotension: No Has patient had a PCN reaction causing severe rash involving mucus membranes or skin necrosis: No Has patient had a PCN reaction that required hospitalization No Has patient had a PCN reaction occurring within the last 10 years: No If all of the above answers are "NO", then may proceed with Cephalosporin use.    Outpatient Encounter Prescriptions as of 07/15/2016  Medication Sig  . ALPRAZolam (XANAX) 0.5 MG tablet Take 1 tablet by mouth daily as needed for anxiety.  Marland Kitchen amLODipine (NORVASC) 10 MG tablet Take 10 mg by mouth at bedtime.  Marland Kitchen aspirin EC 81 MG tablet Take 81 mg by mouth daily.  . cyanocobalamin (,VITAMIN B-12,) 1000 MCG/ML injection Inject 1,000 mcg into the muscle every 30 (thirty) days.  . famotidine (PEPCID) 20 MG tablet Take 20 mg by mouth daily.  Marland Kitchen levothyroxine (SYNTHROID, LEVOTHROID) 100 MCG tablet Take 100 mcg by mouth daily before breakfast.   . thiamine (VITAMIN B-1) 100 MG tablet Take 1 tablet (100 mg total) by mouth daily.  Marland Kitchen dicyclomine (BENTYL) 20 MG tablet Take one every 6 hours as needed for abd cramps (Patient not taking: Reported on 07/15/2016)  . ondansetron (ZOFRAN ODT) 4 MG disintegrating tablet 4mg  ODT q4 hours prn nausea/vomit (Patient not taking: Reported on 07/15/2016)  No facility-administered encounter medications on file as of 07/15/2016.     Past Medical History:  Diagnosis Date  . Anxiety   . Arthritis   . Depression   . Hyperlipemia   . Hypertension   . Thyroid disease   . TIA (transient ischemic attack)     Past Surgical History:  Procedure Laterality Date  . ABDOMINAL HYSTERECTOMY    . BREAST BIOPSY    . TUBAL LIGATION      Social History   Social History  . Marital status: Married    Spouse name: N/A  . Number of children: N/A  . Years of education: N/A   Occupational History  . retired     Geophysicist/field seismologist  . caregiver    Social History Main Topics  .  Smoking status: Former Smoker    Quit date: 05/25/1987  . Smokeless tobacco: Never Used  . Alcohol use Yes     Comment: 3 glasses of wine daily  . Drug use: No  . Sexual activity: No   Other Topics Concern  . Not on file   Social History Narrative  . No narrative on file    Family Status  Relation Status  . Brother Deceased  . Mother Deceased  . Father Deceased  . Daughter   . Sister Deceased  . Brother Deceased  . Sister Deceased  . Child Alive    Review of Systems Some SOB related to allergies.  Some shoulder pain bilaterally. Daughter is very worried about patient's memory, primarily because the patient's mother had Alzheimer's disease.  The patient is still able to manage her activities of daily living.  She is able to manage her medication.  A complete 10 system ROS was obtained and was negative apart from what is mentioned.   Objective:   VITALS:   Vitals:   07/15/16 1300  BP: 124/76  Pulse: 84  SpO2: 96%  Weight: 191 lb (86.6 kg)  Height: 5' 5.5" (1.664 m)   Gen:  Appears stated age and in NAD. HEENT:  Normocephalic, atraumatic. The mucous membranes are moist. The superficial temporal arteries are without ropiness or tenderness. Cardiovascular: Regular rate and rhythm. Lungs: Clear to auscultation bilaterally. Neck: There are no carotid bruits noted bilaterally.  NEUROLOGICAL:  Orientation:  The patient is alert and oriented x 3.  Recent and remote memory are intact.  Attention span and concentration are normal.  Able to name objects and repeat without trouble.  Fund of knowledge is appropriate Cranial nerves: There is good facial symmetry. The pupils are equal round and reactive to light bilaterally. Fundoscopic exam is attempted but the disc margins are not well visualized bilaterally. Extraocular muscles are intact and visual fields are full to confrontational testing. Speech is fluent and clear. Soft palate rises symmetrically and there is no tongue  deviation. Hearing is intact to conversational tone. Tone: Tone is good throughout. Sensation: Sensation is intact to light touch and pinprick throughout (facial, trunk, extremities). Vibration is intact at the bilateral big toe but it is decreased distally. There is no extinction with double simultaneous stimulation. There is no sensory dermatomal level identified. Coordination:  The patient has no dysdiadichokinesia or dysmetria. Motor: Strength is 5/5 in the bilateral upper and lower extremities.  Shoulder shrug is equal bilaterally.  There is no pronator drift.  There are no fasciculations noted. DTR's: Deep tendon reflexes are 2/4 at the bilateral biceps, triceps, brachioradialis, patella and trace at the bilateral achilles.  Plantar responses are  downgoing bilaterally. Gait and Station: The patient is able to ambulate without difficulty. The patient is able to heel toe walk without any difficulty. The patient has trouble ambulating in a tandem fashion.  She is able to stand in the romberg postion.  MOVEMENT EXAM: Tremor:  There is mild tremor in the UE, noted most significantly with action.  She has complex head titubation but mostly in the "no" direction.  No null point.  The patient has mild trouble with Archimedes spiral. There is no tremor at rest.  The patient is able to pour water from one glass to another without spilling it.  Labs:  The patient had lab work from her primary care provider on 03/24/2016.  Her B12 was only 76.  She was started on injections and was rechecked at [place] level on 07/09/16 at was 257    Chemistry      Component Value Date/Time   NA 135 06/04/2016 0851   K 4.3 06/04/2016 0851   CL 103 06/04/2016 0851   CO2 25 06/04/2016 0851   BUN 14 06/04/2016 0851   CREATININE 0.97 06/04/2016 0851      Component Value Date/Time   CALCIUM 8.5 (L) 06/04/2016 0851   ALKPHOS 53 06/04/2016 0851   AST 34 06/04/2016 0851   ALT 34 06/04/2016 0851   BILITOT 0.7 06/04/2016  0851     Lab Results  Component Value Date   TSH 3.738 06/01/2016        Assessment/Plan:   1.  Tremor.  -This is evidenced by the symmetrical nature and longstanding hx of gradually getting worse.    We discussed nature and pathophysiology.  We discussed that this can continue to gradually get worse with time.  We discussed that some medications can worsen this, as can caffeine use.  We discussed that head tremor is resistant to medication.  Fortunately that doesn't really bother her.  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  Talked to her about trying to taper her alcohol use under the direct care of either her primary care physician or someone who has expertise in this area.  -Patient really does not want any new medication.  I did tell her that potentially, if her prescribing provider feels indicated, her blood pressure medication (Norvasc) could be changed to something like propranolol, which would help both blood pressure and tremor.  She can talk to her primary care provider about this.   2.  Severe B12 deficiency  -B12 only 76 before supplementation and still only 257 at peak.  I would like to see this over 400.  Suspect that this is because of chronic alcohol use.  We will order homocystine and methylmalonic acid.  Will also check B1, as she has also been deficient in this area in the past.  She is on a supplement in this regard as well.  If labs are okay, but I told her that a sublingual B12 supplement along with her injection may be of value.  If that does not help, then perhaps a hematology consult.  3.  Memory change  -We will order neurocognitive testing.  4.  Greater than 50% of the 60 minute visit was spent in counseling with the patient and her daughter.

## 2016-07-15 ENCOUNTER — Other Ambulatory Visit: Payer: Medicare HMO

## 2016-07-15 ENCOUNTER — Ambulatory Visit (INDEPENDENT_AMBULATORY_CARE_PROVIDER_SITE_OTHER): Payer: Medicare HMO | Admitting: Neurology

## 2016-07-15 ENCOUNTER — Encounter: Payer: Self-pay | Admitting: Neurology

## 2016-07-15 VITALS — BP 124/76 | HR 84 | Ht 65.5 in | Wt 191.0 lb

## 2016-07-15 DIAGNOSIS — E538 Deficiency of other specified B group vitamins: Secondary | ICD-10-CM

## 2016-07-15 DIAGNOSIS — R413 Other amnesia: Secondary | ICD-10-CM | POA: Diagnosis not present

## 2016-07-15 DIAGNOSIS — R251 Tremor, unspecified: Secondary | ICD-10-CM

## 2016-07-15 DIAGNOSIS — F1099 Alcohol use, unspecified with unspecified alcohol-induced disorder: Secondary | ICD-10-CM | POA: Diagnosis not present

## 2016-07-15 DIAGNOSIS — E519 Thiamine deficiency, unspecified: Secondary | ICD-10-CM | POA: Diagnosis not present

## 2016-07-15 DIAGNOSIS — IMO0002 Reserved for concepts with insufficient information to code with codable children: Secondary | ICD-10-CM

## 2016-07-15 NOTE — Patient Instructions (Signed)
1. Your provider has requested that you have labwork completed today. Please go to [facility] ([id number]) on the second floor of this building before leaving the office today. You do not need to check in. If you are not called within 15 minutes please check with the front desk.    2. Patients age 73+:  You have been referred for a neurocognitive evaluation in our office.   The evaluation takes approximately two hours. The first part of the appointment is a clinical interview with the neuropsychologist (Dr. Macarthur Critchley). Please bring someone with you to this appointment if possible, as it is helpful for Dr. Si Raider to hear from both you and another adult who knows you well. After speaking with Dr. Si Raider, you will complete testing with her technician. The testing includes a variety of tasks- mostly question-and-answer, some paper-and-pencil. There is nothing you need to do to prepare for this appointment, but having a good night's sleep prior to the testing, and bringing eyeglasses and hearing aids (if you wear them), is advised.   About a week after the evaluation, you will return to follow up with Dr. Si Raider to review the test results. This appointment is about 30 minutes. If you would like a family member to receive this information as well, please bring them to the appointment.   We have to reserve several hours of the neuropsychologist's time and the psychometrician's time for your evaluation appointment. As such, please note that there is a No-Show fee of $100. If you are unable to attend any of your appointments, please contact our office as soon as possible to reschedule.    Patients age 59 and under:  You have been referred for a neurocognitive evaluation in our office.   The evaluation consists of three appointments.   1. The first appointment is about 45 minutes and is a clinical interview with the neuropsychologist (Dr. Macarthur Critchley). You can bring someone with you to  this appointment, as it is helpful for Dr. Si Raider to hear from both you and another adult who knows you well.   2. The second appointment is 2-3 hours long and is with the psychometrician Milana Kidney). You will complete a variety of tasks- mostly question-and-answer, some paper-and-pencil, some on the computer. There is nothing you need to do to prepare for this appointment, but having a good night's sleep prior to the testing, and bringing eyeglasses and hearing aids (if you wear them), is advised.   3. The final appointment is a follow-up with Dr. Si Raider where she will go over the test results with you and provide recommendations. This appointment is about 30 minutes.  If you would like a family member to receive this information as well, please bring them to the appointment.   We have to reserve several hours of the neuropsychologist's time and the psychometrician's time for your appointment. As such, please note that there is a No-Show fee of $100. If you are unable to attend any of your appointments, please contact our office as soon as possible to reschedule.

## 2016-07-16 LAB — HOMOCYSTEINE: Homocysteine: 17.9 umol/L — ABNORMAL HIGH (ref <10.4)

## 2016-07-17 LAB — METHYLMALONIC ACID, SERUM: METHYLMALONIC ACID, QUANT: 362 nmol/L — AB (ref 87–318)

## 2016-07-19 ENCOUNTER — Telehealth: Payer: Self-pay | Admitting: Neurology

## 2016-07-19 LAB — VITAMIN B1: Vitamin B1 (Thiamine): 7 nmol/L — ABNORMAL LOW (ref 8–30)

## 2016-07-19 NOTE — Telephone Encounter (Signed)
-----   Message from [place], DO sent at 07/19/2016  1:19 PM EDT ----- Labs consistent with B1 and B12 deficiency, likely due to EtOH.  Needs to make sure that tapering under medical supervision.  Take B1 faithfully.  Start sublingual b12 10105mcg in addition to injections

## 2016-07-19 NOTE — Telephone Encounter (Signed)
Patient made aware.

## 2016-09-09 ENCOUNTER — Ambulatory Visit (INDEPENDENT_AMBULATORY_CARE_PROVIDER_SITE_OTHER): Payer: Medicare HMO | Admitting: Psychology

## 2016-09-09 ENCOUNTER — Encounter: Payer: Self-pay | Admitting: Psychology

## 2016-09-09 DIAGNOSIS — F329 Major depressive disorder, single episode, unspecified: Secondary | ICD-10-CM

## 2016-09-09 DIAGNOSIS — F32A Depression, unspecified: Secondary | ICD-10-CM

## 2016-09-09 DIAGNOSIS — R413 Other amnesia: Secondary | ICD-10-CM

## 2016-09-09 DIAGNOSIS — F419 Anxiety disorder, unspecified: Secondary | ICD-10-CM

## 2016-09-09 NOTE — Progress Notes (Signed)
NEUROPSYCHOLOGICAL INTERVIEW (CPT: [id number])  Name: [name] [name] Date of Birth: [birth year] Date of Interview: 09/09/2016  Reason for Referral:  [name] [name] is a 71 y.o. female who is referred for neuropsychological evaluation by Dr. Wells Guiles Tat of [facility] due to concerns about memory loss. This [name] is unaccompanied in [name] office for today's appointment.  History of Presenting Problem:  [name] [name] was seen for neurologic consultation for tremor by Dr. Carles Collet on 07/19/2016 for tremor. Her daughter expressed concern about [name] [name]'s memory, predominantly because there is a family history of Alzheimer's disease in [name] [name]'s mother. Head CT performed on 06/01/2016 was normal.  [name] [name] stated that her daughter has more concerns about her memory than she herself does, but [name] [name] does notice that she forgets things such as where she puts things, and this may be a little worse than it was in [name] past.  Upon direct questioning, [name] [name] also endorsed some forgetfulness for recent conversations, difficulty concentrating, occasional difficulty with task persistence/distractibility, word finding difficulty and possible repetition of statements/questions (her daughter has told her she does this). She denied difficulty with auditory or reading comprehension. She denied difficulty with navigation or visual spatial perception. She continues to manage all instrumental ADLs, including driving, medications, appointments, finances, and cooking, without any difficulty. 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   Physically she states she feels "not well". She feels very weak at times, and endorses low energy. She has some trouble with walking and feels her balance is off. She has not had any recent falls. She has no history of head injury. She has some difficulty sleeping because her dogs bother her. She does not feel rested upon awakening.  She does not take daytime naps. She was diagnosed with mild sleep apnea a long time ago and prescribed a CPAP but she could not tolerate it. She thinks her appetite has increased, she is eating more and possibly gaining some weight. She has vitamin B12 and B1 deficiencies; she is taking B1 oral supplement and B12 injections every 2 weeks. Dr. Doristine Devoid note mentioned that alcohol use is likely contributing to vitamin deficiencies. [name] [name] reported she consumes three alcoholic beverages on a typical day (one at lunch, two at dinner). She admits to using alcohol to self medicate and calm her nerves. She has never gone a long period without alcohol before, but she has quit in order to undergo medical procedures and she did not experience any withdrawal symptoms. She reported a remote history of substance abuse in her early 49s. She was prescribed Tranxene for anxiety and did not realize it was addictive. She kept taking more of [name] medication in order to try to help her anxiety and ended up in [name] hospital to come off it. She reported it took about 18 months for her to feel normal again afterwards. She denied any other history of substance abuse or dependence.  She describes her current mood as "down". Her mood is often affected by her husband and daughter. She denies suicidal ideation or intention. She denied history of SI or attempt.She has a long history of anxiety and depression. She noted that it has been debilitating in [name] past. There was a period of time she could not drive due to anxiety. She has seen many psychiatrists, psychologists and counselors over [name] years. [name] first medication she was prescribed was Prozac and she has been on several different medications  since then. Currently she is only prescribed Xanax, which she does not take daily. She reported that she breaks off a small piece only on days that her anxiety is particularly severe. She is trying to meditate regularly which has been very helpful;  she would like to take a meditation class.    Social History: Born/Raised: [place] Education: High school Occupational history: Retired Geophysicist/field seismologist, currently is a caregiver for two different women Marital history: Married to her current husband for 46 years, Married once before (first husband is deceased) Children: 1 daughter Alcohol: 3 drinks daily Tobacco: Former smoker  Medical History: Past Medical History:  Diagnosis Date  . Anxiety   . Arthritis   . Depression   . Hyperlipemia   . Hypertension   . Thyroid disease   . TIA (transient ischemic attack)     Current Medications:  Outpatient Encounter Prescriptions as of 09/09/2016  Medication Sig  . ALPRAZolam (XANAX) 0.5 MG tablet Take 1 tablet by mouth daily as needed for anxiety.  Marland Kitchen amLODipine (NORVASC) 10 MG tablet Take 10 mg by mouth at bedtime.  Marland Kitchen aspirin EC 81 MG tablet Take 81 mg by mouth daily.  . cyanocobalamin (,VITAMIN B-12,) 1000 MCG/ML injection Inject 1,000 mcg into [name] muscle every 30 (thirty) days.  Marland Kitchen dicyclomine (BENTYL) 20 MG tablet Take one every 6 hours as needed for abd cramps ([name] not taking: Reported on 07/15/2016)  . famotidine (PEPCID) 20 MG tablet Take 20 mg by mouth daily.  Marland Kitchen levothyroxine (SYNTHROID, LEVOTHROID) 100 MCG tablet Take 100 mcg by mouth daily before breakfast.   . ondansetron (ZOFRAN ODT) 4 MG disintegrating tablet 4mg  ODT q4 hours prn nausea/vomit ([name] not taking: Reported on 07/15/2016)  . thiamine (VITAMIN B-1) 100 MG tablet Take 1 tablet (100 mg total) by mouth daily.   No facility-administered encounter medications on file as of 09/09/2016.     Behavioral Observations:   Appearance: Neatly and appropriately dressed and groomed. Head tremor observed. Gait: Ambulated independently, no gross abnormalities observed Speech: Fluent; normal rate, rhythm and volume. No significant word finding difficulty observed during conversational speech. Thought process: Linear, goal  directed Affect: Full, appears euthymic Interpersonal: Pleasant, appropriate   TESTING: There is medical necessity to proceed with neuropsychological assessment as [name] results will be used to aid in differential diagnosis and clinical decision-making and to inform specific treatment recommendations. Per [name] [name] and medical records reviewed, there has been a change in cognitive functioning and a reasonable suspicion of dementia or mild cognitive disorder.  Following [name] clinical interview, [name] [name] completed a full battery of neuropsychological testing with my psychometrician under my supervision.   PLAN: [name] [name] will return to see me for a follow-up session at which time her test performances and my impressions and treatment recommendations will be reviewed in detail.  Full report to follow.

## 2016-09-09 NOTE — Progress Notes (Signed)
   Neuropsychology Note  [name] came in today for 1 hour of neuropsychological testing with technician, Milana Kidney, BS, under the supervision of Dr. Macarthur Critchley. The patient did not appear overtly distressed by the testing session, per behavioral observation or via self-report to the technician. Rest breaks were offered. [name] will return within 2 weeks for a feedback session with Dr. Si Raider at which time her test performances, clinical impressions and treatment recommendations will be reviewed in detail. The patient understands she can contact our office should she require our assistance before this time.  Full report to follow.

## 2016-09-11 DIAGNOSIS — R69 Illness, unspecified: Secondary | ICD-10-CM | POA: Diagnosis not present

## 2016-09-21 NOTE — Progress Notes (Signed)
NEUROPSYCHOLOGICAL EVALUATION   Name:    [name]  Date of Birth:   1945-10-22 Date of Interview:  09/09/2016 Date of Testing:  09/09/2016   Date of Feedback:  09/24/2016       Background Information:  Reason for Referral:  [name] is a 71 y.o. female referred by [name] to assess her current level of cognitive functioning and assist in differential diagnosis. The current evaluation consisted of a review of available medical records, an interview with the patient, and the completion of a neuropsychological testing battery. Informed consent was obtained.  History of Presenting Problem:  [name] was seen for neurologic consultation for tremor by [name] on 07/19/2016 for tremor. Her daughter expressed concern about the patient's memory, predominantly because there is a family history of Alzheimer's disease in the patient's mother. Head CT performed on 06/01/2016 was normal.  [name] stated that her daughter has more concerns about her memory than she herself does, but the patient does notice that she forgets things such as where she puts things, and this may be a little worse than it was in the past.  Upon direct questioning, the patient also endorsed some forgetfulness for recent conversations, difficulty concentrating, occasional difficulty with task persistence/distractibility, word finding difficulty and possible repetition of statements/questions (her daughter has told her she does this). She denied difficulty with auditory or reading comprehension. She denied difficulty with navigation or visual spatial perception. She continues to manage all instrumental ADLs, including driving, medications, appointments, finances, and cooking, without any difficulty. 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   Physically she states she feels "not well". She feels very weak at times, and endorses low energy. She has some trouble  with walking and feels her balance is off. She has not had any recent falls. She has no history of head injury. She has some difficulty sleeping because her dogs bother her. She does not feel rested upon awakening. She does not take daytime naps. She was diagnosed with mild sleep apnea a long time ago and prescribed a CPAP but she could not tolerate it. She thinks her appetite has increased, she is eating more and possibly gaining some weight. She has vitamin B12 and B1 deficiencies; she is taking B1 oral supplement and B12 injections every 2 weeks. Dr. Doristine Devoid note mentioned that alcohol use is likely contributing to vitamin deficiencies. The patient reported she consumes three alcoholic beverages on a typical day (one at lunch, two at dinner). She admits to using alcohol to self medicate and calm her nerves. She has never gone a long period without alcohol before, but she has quit in order to undergo medical procedures and she did not experience any withdrawal symptoms. She reported a remote history of substance abuse in her early 30s. She was prescribed Tranxene for anxiety and did not realize it was addictive. She kept taking more of the medication in order to try to help her anxiety and ended up in the hospital to come off it. She reported it took about 18 months for her to feel normal again afterwards. She denied any other history of substance abuse or dependence.  She describes her current mood as "down". Her mood is often affected by her husband and daughter. She denies suicidal ideation or intention. She denied history of SI or attempt.She has a long history of anxiety and depression. She noted that it has been  debilitating in the past. There was a period of time she could not drive due to anxiety. She has seen many psychiatrists, psychologists and counselors over the years. The first medication she was prescribed was Prozac and she has been on several different medications since then. Currently she is  only prescribed Xanax, which she does not take daily. She reported that she breaks off a small piece only on days that her anxiety is particularly severe. She is trying to meditate regularly which has been very helpful; she would like to take a meditation class.    Social History: Born/Raised: [place] Education: High school Occupational history: Retired Geophysicist/field seismologist, currently is a caregiver for two different women Marital history: Married to her current husband for 50 years, Married once before (first husband is deceased) Children: 1 daughter Alcohol: 3 drinks daily Tobacco: Former smoker   Medical History:  Past Medical History:  Diagnosis Date  . Anxiety   . Arthritis   . Depression   . Hyperlipemia   . Hypertension   . Thyroid disease   . TIA (transient ischemic attack)     Current medications:  Outpatient Encounter Prescriptions as of 09/24/2016  Medication Sig  . ALPRAZolam (XANAX) 0.5 MG tablet Take 1 tablet by mouth daily as needed for anxiety.  Marland Kitchen amLODipine (NORVASC) 10 MG tablet Take 10 mg by mouth at bedtime.  Marland Kitchen aspirin EC 81 MG tablet Take 81 mg by mouth daily.  . cyanocobalamin (,VITAMIN B-12,) 1000 MCG/ML injection Inject 1,000 mcg into the muscle every 30 (thirty) days.  Marland Kitchen dicyclomine (BENTYL) 20 MG tablet Take one every 6 hours as needed for abd cramps (Patient not taking: Reported on 07/15/2016)  . famotidine (PEPCID) 20 MG tablet Take 20 mg by mouth daily.  Marland Kitchen levothyroxine (SYNTHROID, LEVOTHROID) 100 MCG tablet Take 100 mcg by mouth daily before breakfast.   . ondansetron (ZOFRAN ODT) 4 MG disintegrating tablet 4mg  ODT q4 hours prn nausea/vomit (Patient not taking: Reported on 07/15/2016)  . thiamine (VITAMIN B-1) 100 MG tablet Take 1 tablet (100 mg total) by mouth daily.   No facility-administered encounter medications on file as of 09/24/2016.      Current Examination:  Behavioral Observations:   Appearance: Neatly and appropriately dressed and groomed. Head  tremor observed. Gait: Ambulated independently, no gross abnormalities observed Speech: Fluent; normal rate, rhythm and volume. No significant word finding difficulty observed during conversational speech. Thought process: Linear, goal directed Affect: Full, appears euthymic Interpersonal: Pleasant, appropriate Orientation: Oriented to all spheres. Accurately named the current President and his predecessor.   Tests Administered: . Test of Premorbid Functioning (TOPF) . Wechsler Adult Intelligence Scale-Fourth Edition (WAIS-IV): Similarities, Music therapist, Coding and Digit Span subtests . Wechsler Memory Scale-Fourth Edition (WMS-IV) Older Adult Version (ages 68-90): Logical Memory I, II and Recognition subtests  . [employer] Verbal Learning Test - 2nd Edition (CVLT-2) Short Form . Repeatable Battery for the Assessment of Neuropsychological Status (RBANS) Form A:  Figure Copy and Recall subtests and Semantic Fluency subtest . Neuropsychological Assessment Battery (NAB) Language Module, Form 1: Naming subtest . [place] Diagnostic Aphasia Examination: Complex Ideational Material subtest . Controlled Oral Word Association Test (COWAT) . Trail Making Test A and B . Clock drawing test . Geriatric Depression Scale (GDS) 15 Item . Generalized Anxiety Disorder - 7 item screener (GAD-7)  Test Results: Note: Standardized scores are presented only for use by appropriately trained professionals and to allow for any future test-retest comparison. These scores should not be interpreted without  consideration of all the information that is contained in the rest of the report. The most recent standardization samples from the test publisher or other sources were used whenever possible to derive standard scores; scores were corrected for age, gender, ethnicity and education when available.   Test Scores:  Test Name Raw Score Standardized Score Descriptor  TOPF 30/70 SS= 90 Average  WAIS-IV Subtests       Similarities 14/36 ss= 6 Low average  Block Design 24/66 ss= 8 Average  Coding 38/135 ss= 8 Average  Digit Span Forward 7/16 ss= 7 Low average  Digit Span Backward 9/16 ss= 11 Average  WMS-IV Subtests     LM I 21/53 ss= 6 Low average  LM II 12/39 ss= 8 Average  LM II Recognition 19/23 Cum %: 51-75 WNL  RBANS Subtests     Figure Copy 17/20 Z= -0.4 Average  Figure Recall 7/20 Z= -1.3 Low average  Semantic Fluency 14/40 Z= -1.1 Low average  CVLT-II Scores     Trial 1 4/9 Z= -1 Low average  Trial 4 7/9 Z= -0.5 Average  Trials 1-4 total 22/36 T= 42 Low average  SD Free Recall 7/9 Z= 0 Average  LD Free Recall 6/9 Z= 0 Average  LD Cued Recall 8/9 Z= 0.5 Average  Recognition Discriminability 9/9 hits, 0 false positives Z= 0.5 Average  Forced Choice Recognition 9/9  WNL  NAB Naming 31/31 T= 59 High average  BDAE Complex Ideational Material 11/12  WNL  COWAT-FAS 32 T= 47 Average  COWAT-Animals 15 T= 47 Average  Trail Making Test A  37" 0 errors T= 53 Average  Trail Making Test B  155" 0 errors T= 44 Average  Clock Drawing   WNL  GDS-15 9/15  Moderate   GAD-7 14/21  Moderate      Description of Test Results:  Premorbid verbal intellectual abilities were estimated to have been within the low average to average range based on a test of word reading. Psychomotor processing speed was average. Auditory attention and working memory were low average to average. Visual-spatial construction was average. Language abilities were intact. Specifically, confrontation naming was high average, and semantic verbal fluency was average to low average. Auditory comprehension of complex ideational material was within normal limits. With regard to verbal memory, encoding and acquisition of non-contextual information (i.e., word list) was low average across four learning trials. After a brief distracter task, free recall was average (7/9 items recalled). After a delay, free recall was average (6/9 items  recalled). Cued recall was average (8/9 items recalled). Performance on a yes/no recognition task was average with 100% accuracy. On another verbal memory test, encoding and acquisition of contextual auditory information (i.e., short stories) was low average. After a delay, free recall was average. Performance on a yes/no recognition task was within normal limits. With regard to non-verbal memory, delayed free recall of visual information was low average. Executive functioning was generally within normal limits. Mental flexibility and set-shifting were average on Trails B. Verbal fluency with phonemic search restrictions was average. Verbal abstract reasoning was low average. Performance on a clock drawing task was within normal limits (correct time indication but somewhat poor planning). On a self-report measure of mood, the patient's responses were indicative of clinically significant depression at the present time. On a self-report measure of anxiety, the patient endorsed clinically significant generalized anxiety at the present time.    Clinical Impressions: Major depressive disorder, recurrent. No cognitive impairment appreciated on formal examination. Results  of cognitive testing were entirely within normal limits and commensurate with estimated premorbid intellectual abilities. There were no areas of impairment, and most performances fell within the average range. Meanwhile, there is evidence of significant depression and anxiety, and the patient's subjective cognitive complaints are most likely secondary to these issues. Poor sleep could also be having an effect on cognitive functioning in daily life. There is no evidence of an underlying dementia or cognitive disorder at this time. I am hopeful that more aggressive treatment of her depression and anxiety will result in improved quality of life as well as enhanced cognitive function in daily life.    Recommendations/Plan: Based on the findings of the  present evaluation, the following recommendations are offered:   1. Treatment for depression/anxiety: The patient is currently taking only Xanax as needed. Consideration of an SSRI for depression is recommended, but the patient declined referral to a psychiatrist as she is not interested in medication. I also think she would really benefit from individual psychotherapy and in particular, given her interest in meditation, a mindfulness based cognitive therapist. She was open to this. Additionally, she was encouraged to explore mindfulness meditation opportunities available in the community. She may also be interested in reading books about mindfulness.  2. Sleep study could be considered given her prior history of sleep apnea. She was unable to tolerate her CPAP in the past, but if she does have sleep apnea, she needs to understand that left untreated it could cause white matter changes in the brain and could contribute significantly to her fatigue.  3. Reducing alcohol use is recommended. She admits her alcohol use may be an attempt to self medicate her depression/anxiety, and as such she will likely need better treatment of her depression before she can successfully reduce her drinking.   4. The patient was reassured that her cognitive test results were entirely within normal limits and not indicative of a cognitive disorder or dementia at this time. These test results will serve as a nice baseline for future comparison if needed at any time.   Feedback to Patient: DORINE DUFFEY returned for a feedback appointment on 09/24/2016 to review the results of her neuropsychological evaluation with this provider. 20 minutes face-to-face time was spent reviewing her test results, my impressions and my recommendations as detailed above.    Total time spent on this patient's case: [id number] unit for interview with psychologist; [phone] units of testing by psychometrician under psychologist's supervision;  [phone] units for medical record review, scoring of neuropsychological tests, interpretation of test results, preparation of this report, and review of results to the patient by psychologist.      Thank you for your referral of Leitha Bleak. Please feel free to contact me if you have any questions or concerns regarding this report.

## 2016-09-24 ENCOUNTER — Encounter: Payer: Self-pay | Admitting: Psychology

## 2016-09-24 ENCOUNTER — Ambulatory Visit (INDEPENDENT_AMBULATORY_CARE_PROVIDER_SITE_OTHER): Payer: Medicare HMO | Admitting: Psychology

## 2016-09-24 DIAGNOSIS — R413 Other amnesia: Secondary | ICD-10-CM | POA: Diagnosis not present

## 2016-09-24 DIAGNOSIS — F331 Major depressive disorder, recurrent, moderate: Secondary | ICD-10-CM

## 2016-09-24 NOTE — Addendum Note (Signed)
Addended by: Glenford Bayley B on: 09/24/2016 02:32 PM   Modules accepted: Orders

## 2016-09-24 NOTE — Patient Instructions (Addendum)
Fortunately, results of cognitive testing were entirely within normal limits and NOT indicative of a neurocognitive disorder or underlying dementia. It is most likely the case that your cognitive symptoms in daily life are due to depression and anxiety. Poor sleep is also likely having an effect. Below is some more information on this. I am hopeful that more aggressive treatment of depression and anxiety (e.g. psychotherapy) will not only improve your mood and coping, but also result in improved cognitive functioning in your daily life.    The effect of depression and anxiety on your cognitive functioning: . One of the typical symptoms of depression is difficulty concentrating and making decisions, and various types of anxiety also interfere with attention and concentration . Problems with attention and concentration can disrupt the process of learning and making new memories, which can make it seem like there is a problem with your memory. In your daily life, you may experience this disruption as forgetting names and appointments, misplacing items, and needing to make lists for shopping and errands. It may be harder for you to stay focused on tasks and feel as "sharp" as you did in the past.  . Also, when we are depressed or anxious, we often pay more attention to our difficulties (rather than our strengths) in our daily life, and this can make it seem to [place] like we are doing worse cognitively than we really are. . The cognitive aspects of depression and anxiety are sometimes observed as an identifiable pattern of poor performance on a neuropsychological evaluation, but it is also possible that all scores on an evaluation are within normal limits. . Regardless of the test scores, distress related to depression and anxiety can interfere with the ability to make use of your cognitive resources and function optimally across settings such as work or school, maintaining the home and responsibilities, and  personal relationships. . Fortunately, there are treatments for depression and anxiety, and when mood improves, cognitive functioning in daily life often improves. . Treatment options include psychotherapy, medications (e.g., antidepressants), and behavioral changes, such as increasing your involvement in enjoyable activities, increasing the amount of exercise you are getting, and maintaining a regular routine.   Additional recommendations: -Speak with your physician about a sleep study. If you do have sleep apnea and it is untreated, that can cause white matter changes in the brain that result in memory/attention deficits - but with proper treatment, these are reversible! -Reducing alcohol use is recommended. With better treatment of your depression, I think you'll be able to do this in the future.

## 2016-09-30 ENCOUNTER — Encounter: Payer: Self-pay | Admitting: Psychology

## 2016-10-07 DIAGNOSIS — R0781 Pleurodynia: Secondary | ICD-10-CM | POA: Diagnosis not present

## 2016-10-22 DIAGNOSIS — M79605 Pain in left leg: Secondary | ICD-10-CM | POA: Diagnosis not present

## 2016-10-22 DIAGNOSIS — R102 Pelvic and perineal pain: Secondary | ICD-10-CM | POA: Diagnosis not present

## 2016-10-22 DIAGNOSIS — L719 Rosacea, unspecified: Secondary | ICD-10-CM | POA: Diagnosis not present

## 2016-10-22 DIAGNOSIS — R0602 Shortness of breath: Secondary | ICD-10-CM | POA: Diagnosis not present

## 2016-10-25 DIAGNOSIS — E039 Hypothyroidism, unspecified: Secondary | ICD-10-CM | POA: Diagnosis not present

## 2016-10-25 DIAGNOSIS — M79605 Pain in left leg: Secondary | ICD-10-CM | POA: Diagnosis not present

## 2016-10-25 DIAGNOSIS — R0602 Shortness of breath: Secondary | ICD-10-CM | POA: Diagnosis not present

## 2016-10-25 DIAGNOSIS — R102 Pelvic and perineal pain: Secondary | ICD-10-CM | POA: Diagnosis not present

## 2016-10-25 DIAGNOSIS — L719 Rosacea, unspecified: Secondary | ICD-10-CM | POA: Diagnosis not present

## 2016-10-25 DIAGNOSIS — E538 Deficiency of other specified B group vitamins: Secondary | ICD-10-CM | POA: Diagnosis not present

## 2016-10-25 DIAGNOSIS — Z1211 Encounter for screening for malignant neoplasm of colon: Secondary | ICD-10-CM | POA: Diagnosis not present

## 2016-12-19 DIAGNOSIS — Z23 Encounter for immunization: Secondary | ICD-10-CM | POA: Diagnosis not present

## 2016-12-23 DIAGNOSIS — R300 Dysuria: Secondary | ICD-10-CM | POA: Diagnosis not present

## 2016-12-30 DIAGNOSIS — E538 Deficiency of other specified B group vitamins: Secondary | ICD-10-CM | POA: Diagnosis not present

## 2016-12-30 DIAGNOSIS — R202 Paresthesia of skin: Secondary | ICD-10-CM | POA: Diagnosis not present

## 2016-12-30 DIAGNOSIS — E039 Hypothyroidism, unspecified: Secondary | ICD-10-CM | POA: Diagnosis not present

## 2016-12-30 DIAGNOSIS — E871 Hypo-osmolality and hyponatremia: Secondary | ICD-10-CM | POA: Diagnosis not present

## 2016-12-30 DIAGNOSIS — M25562 Pain in left knee: Secondary | ICD-10-CM | POA: Diagnosis not present

## 2016-12-30 DIAGNOSIS — M25561 Pain in right knee: Secondary | ICD-10-CM | POA: Diagnosis not present

## 2016-12-30 DIAGNOSIS — M5442 Lumbago with sciatica, left side: Secondary | ICD-10-CM | POA: Diagnosis not present

## 2016-12-30 DIAGNOSIS — R200 Anesthesia of skin: Secondary | ICD-10-CM | POA: Diagnosis not present

## 2016-12-30 DIAGNOSIS — E519 Thiamine deficiency, unspecified: Secondary | ICD-10-CM | POA: Diagnosis not present

## 2017-01-16 DIAGNOSIS — M25562 Pain in left knee: Secondary | ICD-10-CM | POA: Diagnosis not present

## 2017-01-16 DIAGNOSIS — G8929 Other chronic pain: Secondary | ICD-10-CM | POA: Diagnosis not present

## 2017-01-16 DIAGNOSIS — M25561 Pain in right knee: Secondary | ICD-10-CM | POA: Diagnosis not present

## 2017-01-20 DIAGNOSIS — G8929 Other chronic pain: Secondary | ICD-10-CM | POA: Diagnosis not present

## 2017-01-20 DIAGNOSIS — M545 Low back pain: Secondary | ICD-10-CM | POA: Diagnosis not present

## 2017-02-13 DIAGNOSIS — E538 Deficiency of other specified B group vitamins: Secondary | ICD-10-CM | POA: Diagnosis not present

## 2017-02-13 DIAGNOSIS — E039 Hypothyroidism, unspecified: Secondary | ICD-10-CM | POA: Diagnosis not present

## 2017-02-13 DIAGNOSIS — E871 Hypo-osmolality and hyponatremia: Secondary | ICD-10-CM | POA: Diagnosis not present

## 2017-04-02 DIAGNOSIS — M25561 Pain in right knee: Secondary | ICD-10-CM | POA: Diagnosis not present

## 2017-04-02 DIAGNOSIS — M791 Myalgia, unspecified site: Secondary | ICD-10-CM | POA: Diagnosis not present

## 2017-04-02 DIAGNOSIS — J0100 Acute maxillary sinusitis, unspecified: Secondary | ICD-10-CM | POA: Diagnosis not present

## 2017-05-01 DIAGNOSIS — M170 Bilateral primary osteoarthritis of knee: Secondary | ICD-10-CM | POA: Diagnosis not present

## 2017-05-15 DIAGNOSIS — E039 Hypothyroidism, unspecified: Secondary | ICD-10-CM | POA: Diagnosis not present

## 2017-05-19 DIAGNOSIS — L821 Other seborrheic keratosis: Secondary | ICD-10-CM | POA: Diagnosis not present

## 2017-05-19 DIAGNOSIS — L2089 Other atopic dermatitis: Secondary | ICD-10-CM | POA: Diagnosis not present

## 2017-05-19 DIAGNOSIS — L820 Inflamed seborrheic keratosis: Secondary | ICD-10-CM | POA: Diagnosis not present

## 2017-05-19 DIAGNOSIS — L700 Acne vulgaris: Secondary | ICD-10-CM | POA: Diagnosis not present

## 2017-06-30 DIAGNOSIS — Z1231 Encounter for screening mammogram for malignant neoplasm of breast: Secondary | ICD-10-CM | POA: Diagnosis not present

## 2017-07-01 DIAGNOSIS — I10 Essential (primary) hypertension: Secondary | ICD-10-CM | POA: Diagnosis not present

## 2017-07-01 DIAGNOSIS — E538 Deficiency of other specified B group vitamins: Secondary | ICD-10-CM | POA: Diagnosis not present

## 2017-07-01 DIAGNOSIS — Z23 Encounter for immunization: Secondary | ICD-10-CM | POA: Diagnosis not present

## 2017-07-01 DIAGNOSIS — Z1211 Encounter for screening for malignant neoplasm of colon: Secondary | ICD-10-CM | POA: Diagnosis not present

## 2017-07-01 DIAGNOSIS — E039 Hypothyroidism, unspecified: Secondary | ICD-10-CM | POA: Diagnosis not present

## 2017-07-01 DIAGNOSIS — F419 Anxiety disorder, unspecified: Secondary | ICD-10-CM | POA: Diagnosis not present

## 2017-07-01 DIAGNOSIS — Z0000 Encounter for general adult medical examination without abnormal findings: Secondary | ICD-10-CM | POA: Diagnosis not present

## 2017-07-01 DIAGNOSIS — K219 Gastro-esophageal reflux disease without esophagitis: Secondary | ICD-10-CM | POA: Diagnosis not present

## 2017-07-01 DIAGNOSIS — R7303 Prediabetes: Secondary | ICD-10-CM | POA: Diagnosis not present

## 2017-07-10 DIAGNOSIS — W57XXXA Bitten or stung by nonvenomous insect and other nonvenomous arthropods, initial encounter: Secondary | ICD-10-CM | POA: Diagnosis not present

## 2017-07-10 DIAGNOSIS — S1096XA Insect bite of unspecified part of neck, initial encounter: Secondary | ICD-10-CM | POA: Diagnosis not present

## 2017-07-11 DIAGNOSIS — F419 Anxiety disorder, unspecified: Secondary | ICD-10-CM | POA: Diagnosis not present

## 2017-07-11 DIAGNOSIS — R079 Chest pain, unspecified: Secondary | ICD-10-CM | POA: Diagnosis not present

## 2017-09-01 ENCOUNTER — Encounter (INDEPENDENT_AMBULATORY_CARE_PROVIDER_SITE_OTHER): Payer: Self-pay | Admitting: Orthopaedic Surgery

## 2017-09-01 ENCOUNTER — Encounter (INDEPENDENT_AMBULATORY_CARE_PROVIDER_SITE_OTHER): Payer: Self-pay | Admitting: *Deleted

## 2017-09-01 ENCOUNTER — Telehealth (INDEPENDENT_AMBULATORY_CARE_PROVIDER_SITE_OTHER): Payer: Self-pay | Admitting: *Deleted

## 2017-09-01 ENCOUNTER — Ambulatory Visit (INDEPENDENT_AMBULATORY_CARE_PROVIDER_SITE_OTHER): Payer: Medicare HMO | Admitting: Orthopaedic Surgery

## 2017-09-01 VITALS — BP 131/85 | HR 84 | Resp 16 | Ht 65.0 in | Wt 190.0 lb

## 2017-09-01 DIAGNOSIS — M170 Bilateral primary osteoarthritis of knee: Secondary | ICD-10-CM

## 2017-09-01 NOTE — Progress Notes (Signed)
Office Visit Note   Patient: [name]           Date of Birth: Jun 03, 1945           MRN: [id number] Visit Date: 09/01/2017              Requested by: Orpah Melter, MD [address] [place],  [address] PCP: Guido Sander T   Assessment & Plan: Visit Diagnoses:  1. Bilateral primary osteoarthritis of knee     Plan: Advanced osteoarthritis both knees with multiple prior cortisone injections.  Long discussion regarding diagnosis and treatment options.  [name] would like to try Visco supplementation.  We will check with the insurance carrier regarding availability and also discussed knee replacement as definitive treatment.  Strongly suggest exercises and weight loss.  Follow-Up Instructions: Return will pre cert visco.   Orders:  No orders of the defined types were placed in this encounter.  No orders of the defined types were placed in this encounter.     Procedures: No procedures performed   Clinical Data: No additional findings.   Subjective: Chief Complaint  Patient presents with  . Right Knee - Pain  . Left Knee - Pain  [name] is 72 years old and visited the office for yet another opinion regarding a problem knee pain.  He has been previously diagnosed with osteoarthritis by her primary care physician and by another orthopedist in [place].  [name] relates that she is had multiple cortisone injections in both knees recently have not "made much of a difference.  She has tried over-the-counter medicines but does have a problem with her stomach .not had any specific injury or trauma.  She will have stiffness and soreness and occasionally swelling.  Rarely does she have a feeling of her knee giving way.  She does not use ambulatory aid.  No groin or back pain. I reviewed the films of her knee PACS system was of the left knee revealed significant narrowing of the medial compartment with subchondral sclerosis osteophyte formation.  No  ectopic calcification.  There is about 3 degrees of varus some mild sclerosis of the subchondral region of the lateral joint line area films of the right knee reveal large osteophyte along the medial tibial plateau and femur but with a more maintained joint space.  Alignment was neutral patella views were not included  HPI  Review of Systems  Constitutional: Positive for fatigue.  HENT: Negative for tinnitus.   Eyes: Negative for pain.  Respiratory: Negative for cough.   Cardiovascular: Negative for chest pain.  Gastrointestinal: Negative for abdominal pain.  Endocrine: Negative for polyuria.  Genitourinary: Positive for pelvic pain.  Musculoskeletal: Positive for gait problem and joint swelling.  Skin: Positive for rash.  Allergic/Immunologic: Negative for food allergies.  Neurological: Positive for weakness. Negative for dizziness and headaches.  Hematological: Bruises/bleeds easily.  Psychiatric/Behavioral: Negative for confusion.     Objective: Vital Signs: BP 131/85 (BP Location: Left Arm, Patient Position: Sitting, Cuff Size: Normal)   Pulse 84   Resp 16   Ht 5\' 5"  (1.651 m)   Wt 190 lb (86.2 kg) Comment: per patient  BMI 31.62 kg/m   Physical Exam  Constitutional: She is oriented to person, place, and time. She appears well-developed and well-nourished.  HENT:  Mouth/Throat: Oropharynx is clear and moist.  Eyes: Pupils are equal, round, and reactive to light. EOM are normal.  Pulmonary/Chest: Effort normal.  Neurological: She is alert and  oriented to person, place, and time.  Skin: Skin is warm and dry.  Psychiatric: She has a normal mood and affect. Her behavior is normal.    Ortho Exam awake alert and oriented x3.  Comfortable sitting.  Both knees reveal some patellar crepitation.  Predominantly medial joint pain.  Full extension flexion over 105 degrees without instability.  No popliteal pain.  No calf discomfort.  Minimal swelling of both ankles.  Good feeling in  both feet.  Feet were warm.  Motor exam intact.  Straight leg raise negative.  His range of motion both hips  Specialty Comments:  No specialty comments available.  Imaging: No results found.   PMFS History: Patient Active Problem List   Diagnosis Date Noted  . Numbness and tingling in left arm 05/24/2012  . Numbness of face 05/24/2012  . Unspecified hypothyroidism 05/24/2012  . Alcohol abuse, daily use 05/24/2012  . Hypertension   . Hyperlipemia   . Depression   . Anxiety    Past Medical History:  Diagnosis Date  . Anxiety   . Arthritis   . Depression   . Hyperlipemia   . Hypertension   . Thyroid disease   . TIA (transient ischemic attack)     Family History  Problem Relation Age of Onset  . Alcohol abuse Brother   . Heart disease Brother   . Diabetes Mother   . Alzheimer's disease Mother   . Dementia Mother   . COPD Father   . Emphysema Father   . Heart attack Daughter   . Heart disease Sister   . Emphysema Brother     Past Surgical History:  Procedure Laterality Date  . ABDOMINAL HYSTERECTOMY    . BREAST BIOPSY    . TUBAL LIGATION     Social History   Occupational History  . Occupation: retired    Comment: Geophysicist/field seismologist  . Occupation: caregiver  Tobacco Use  . Smoking status: Former Smoker    Last attempt to quit: 05/25/1987    Years since quitting: 30.2  . Smokeless tobacco: Never Used  Substance and Sexual Activity  . Alcohol use: Yes    Comment: 3 glasses of wine daily  . Drug use: No  . Sexual activity: Never

## 2017-09-01 NOTE — Telephone Encounter (Signed)
Please apply for Visco, Bil for Dr. Durward Fortes. Thank you.

## 2017-09-02 ENCOUNTER — Telehealth (INDEPENDENT_AMBULATORY_CARE_PROVIDER_SITE_OTHER): Payer: Self-pay

## 2017-09-02 NOTE — Telephone Encounter (Signed)
Message routed to [name] to apply for Visco injections.

## 2017-09-02 NOTE — Telephone Encounter (Signed)
Submitted application online for Monovisc, bilateral knee 

## 2017-09-02 NOTE — Telephone Encounter (Signed)
-----   Message from Shona Needles, RT sent at 09/01/2017  3:56 PM EDT ----- Regarding: VISCO BIL KNEES FOR DR. Durward Fortes Please apply for visco supplementation for bil knee for Dr. Durward Fortes. Thank you.

## 2017-09-02 NOTE — Telephone Encounter (Signed)
Noted  

## 2017-09-10 ENCOUNTER — Encounter (INDEPENDENT_AMBULATORY_CARE_PROVIDER_SITE_OTHER): Payer: Self-pay | Admitting: Radiology

## 2017-09-10 NOTE — Progress Notes (Unsigned)
Will you please call this patient and schedule Monovisc bilateral knees, with [place]?  Buy and bill.  Can you tell him the below, so he will know how much he [name] be billed for after we file?  Thanks!   Insurance covers [place] at 80%, patient will owe 20% OOP.  (approx $350 x 2 for both knees, we will bill her- this is an estimate only) No PA needed, ok to buy and bill.

## 2017-09-15 ENCOUNTER — Telehealth (INDEPENDENT_AMBULATORY_CARE_PROVIDER_SITE_OTHER): Payer: Self-pay

## 2017-09-15 NOTE — Telephone Encounter (Signed)
Will you please call this patient and schedule Monovisc bilateral knees, with [place]?  Buy and bill.  Can you tell him the below, so he will know how much he may be billed for after we file?  Thanks!   Insurance covers [place] at 80%, patient will owe 20% OOP.  (approx $350 x 2 for both knees, we will bill her- this is an estimate only) No PA needed, ok to buy and bill.

## 2017-09-16 NOTE — Progress Notes (Signed)
PLEASE ADVISE.

## 2017-09-16 NOTE — Telephone Encounter (Signed)
Spoke with patient who stated that her 20% is too expensive, therefore cannot schedule the appointment.  Patient states she is going to ask Dr. Durward Fortes if she can get another cortisone injection because that is covered 100% by insurance.

## 2017-09-16 NOTE — Telephone Encounter (Signed)
Noted. Thank You.

## 2017-09-16 NOTE — Progress Notes (Signed)
Spoke with patient who stated that her 20% (approx $350 x 2 for both knees / estimate only) is too expensive for her and wants to know if there is anything else she can do.

## 2017-09-17 NOTE — Progress Notes (Signed)
Offer PT-exercises might make a big difference

## 2017-09-17 NOTE — Progress Notes (Signed)
Pt would like to know if she can get cortisone injection?

## 2017-09-19 NOTE — Progress Notes (Signed)
Ok for cortisone injection-make appt

## 2017-09-19 NOTE — Progress Notes (Signed)
PLEASE SCHEDULE FOR INJECTION. [place]

## 2017-09-23 ENCOUNTER — Encounter (INDEPENDENT_AMBULATORY_CARE_PROVIDER_SITE_OTHER): Payer: Self-pay | Admitting: Orthopaedic Surgery

## 2017-09-23 ENCOUNTER — Ambulatory Visit (INDEPENDENT_AMBULATORY_CARE_PROVIDER_SITE_OTHER): Payer: Medicare HMO | Admitting: Orthopaedic Surgery

## 2017-09-23 VITALS — BP 120/74 | HR 100 | Ht 64.0 in | Wt 185.0 lb

## 2017-09-23 DIAGNOSIS — M1711 Unilateral primary osteoarthritis, right knee: Secondary | ICD-10-CM | POA: Diagnosis not present

## 2017-09-23 DIAGNOSIS — M1712 Unilateral primary osteoarthritis, left knee: Secondary | ICD-10-CM

## 2017-09-23 DIAGNOSIS — M170 Bilateral primary osteoarthritis of knee: Secondary | ICD-10-CM | POA: Insufficient documentation

## 2017-09-23 MED ORDER — BUPIVACAINE HCL 0.5 % IJ SOLN
2.0000 mL | INTRAMUSCULAR | Status: AC | PRN
  Administered 2017-09-23: 2 mL via INTRA_ARTICULAR

## 2017-09-23 MED ORDER — METHYLPREDNISOLONE ACETATE 40 MG/ML IJ SUSP
80.0000 mg | INTRAMUSCULAR | Status: AC | PRN
  Administered 2017-09-23: 80 mg

## 2017-09-23 MED ORDER — LIDOCAINE HCL 1 % IJ SOLN
2.0000 mL | INTRAMUSCULAR | Status: AC | PRN
  Administered 2017-09-23: 2 mL

## 2017-09-23 NOTE — Progress Notes (Signed)
Office Visit Note   Patient: [name]           Date of Birth: 1945-10-21           MRN: [id number] Visit Date: 09/23/2017              Requested by: Burman Riis No address on file PCP: Guido Sander T   Assessment & Plan: Visit Diagnoses:  1. Bilateral primary osteoarthritis of knee     Plan: Current symptoms of osteoarthritis right knee.  We will plan to inject with cortisone.  Insurance company rejected Visco supplementation.  Follow-Up Instructions: Return if symptoms worsen or fail to improve.   Orders:  Orders Placed This Encounter  Procedures  . Large Joint Inj: R knee   No orders of the defined types were placed in this encounter.     Procedures: Large Joint Inj: R knee on 09/23/2017 2:29 PM Indications: pain and diagnostic evaluation Details: 25 G 1.5 in needle, anteromedial approach  Arthrogram: No  Medications: 2 mL lidocaine 1 %; 2 mL bupivacaine 0.5 %; 80 mg methylPREDNISolone acetate 40 MG/ML Procedure, treatment alternatives, risks and benefits explained, specific risks discussed. Consent was given by the patient. Immediately prior to procedure a time out was called to verify the correct patient, procedure, equipment, support staff and site/side marked as required. Patient was prepped and draped in the usual sterile fashion.       Clinical Data: No additional findings.   Subjective: Chief Complaint  Patient presents with  . Follow-up    R KNEE PAIN FOR FEW DAYS BUT PAIN IS WORSE USUALLY L KNEE IS WORSE, WOULD LIKE INJECTION  [name] has a diagnosis of osteoarthritis of both of her knees by plain films.  She is had cortisone in the past which gave her some temporary relief of her pain.  We have contacted the insurance company regarding Visco supplementation.  Her co-pay would be over $700.  To have another shot of cortisone in her that is the more symptomatic.  HPI  Review of Systems  Constitutional: Positive for fatigue. Negative for  fever.  HENT: Negative for ear pain.   Eyes: Negative for pain.  Respiratory: Negative for cough and shortness of breath.   Cardiovascular: Positive for leg swelling.  Gastrointestinal: Negative for constipation and diarrhea.  Genitourinary: Negative for difficulty urinating.  Musculoskeletal: Positive for back pain and neck pain.  Skin: Positive for rash.  Allergic/Immunologic: Negative for food allergies.  Neurological: Positive for weakness and numbness.  Hematological: Bruises/bleeds easily.  Psychiatric/Behavioral: Negative for sleep disturbance.     Objective: Vital Signs: BP 120/74 (BP Location: Right Arm, Patient Position: Sitting, Cuff Size: Normal)   Pulse 100   Ht 5\' 4"  (1.626 m)   Wt 185 lb (83.9 kg)   BMI 31.76 kg/m   Physical Exam  Constitutional: She is oriented to person, place, and time. She appears well-developed and well-nourished.  HENT:  Mouth/Throat: Oropharynx is clear and moist.  Eyes: Pupils are equal, round, and reactive to light. EOM are normal.  Pulmonary/Chest: Effort normal.  Neurological: She is alert and oriented to person, place, and time.  Skin: Skin is warm and dry.  Psychiatric: She has a normal mood and affect. Her behavior is normal.    Ortho Exam awake alert and oriented x3.  Comfortable sitting.  Right knee without effusion.  Predominantly medial joint pain.  No pain laterally.  A little bit of patella crepitation.  Lacks just a  few degrees to full knee extension but flexed over 100 degrees without instability.  No distal edema. Specialty Comments:  No specialty comments available.  Imaging: No results found.   PMFS History: Patient Active Problem List   Diagnosis Date Noted  . Bilateral primary osteoarthritis of knee 09/23/2017  . Numbness and tingling in left arm 05/24/2012  . Numbness of face 05/24/2012  . Unspecified hypothyroidism 05/24/2012  . Alcohol abuse, daily use 05/24/2012  . Hypertension   . Hyperlipemia   .  Depression   . Anxiety    Past Medical History:  Diagnosis Date  . Anxiety   . Arthritis   . Depression   . Hyperlipemia   . Hypertension   . Thyroid disease   . TIA (transient ischemic attack)     Family History  Problem Relation Age of Onset  . Alcohol abuse Brother   . Heart disease Brother   . Diabetes Mother   . Alzheimer's disease Mother   . Dementia Mother   . COPD Father   . Emphysema Father   . Heart attack Daughter   . Heart disease Sister   . Emphysema Brother     Past Surgical History:  Procedure Laterality Date  . ABDOMINAL HYSTERECTOMY    . BREAST BIOPSY    . TUBAL LIGATION     Social History   Occupational History  . Occupation: retired    Comment: Geophysicist/field seismologist  . Occupation: caregiver  Tobacco Use  . Smoking status: Former Smoker    Last attempt to quit: 05/25/1987    Years since quitting: 30.3  . Smokeless tobacco: Never Used  Substance and Sexual Activity  . Alcohol use: Yes    Comment: 3 glasses of wine daily  . Drug use: No  . Sexual activity: Never

## 2017-12-11 DIAGNOSIS — Z1211 Encounter for screening for malignant neoplasm of colon: Secondary | ICD-10-CM | POA: Diagnosis not present

## 2017-12-18 DIAGNOSIS — M25561 Pain in right knee: Secondary | ICD-10-CM | POA: Diagnosis not present

## 2017-12-18 DIAGNOSIS — M25562 Pain in left knee: Secondary | ICD-10-CM | POA: Diagnosis not present

## 2018-01-06 DIAGNOSIS — R251 Tremor, unspecified: Secondary | ICD-10-CM | POA: Diagnosis not present

## 2018-01-06 DIAGNOSIS — R350 Frequency of micturition: Secondary | ICD-10-CM | POA: Diagnosis not present

## 2018-01-06 DIAGNOSIS — R232 Flushing: Secondary | ICD-10-CM | POA: Diagnosis not present

## 2018-01-06 DIAGNOSIS — R61 Generalized hyperhidrosis: Secondary | ICD-10-CM | POA: Diagnosis not present

## 2018-01-06 DIAGNOSIS — E890 Postprocedural hypothyroidism: Secondary | ICD-10-CM | POA: Diagnosis not present

## 2018-01-12 DIAGNOSIS — M25561 Pain in right knee: Secondary | ICD-10-CM | POA: Diagnosis not present

## 2018-01-30 DIAGNOSIS — Z23 Encounter for immunization: Secondary | ICD-10-CM | POA: Diagnosis not present

## 2018-02-08 DIAGNOSIS — J0110 Acute frontal sinusitis, unspecified: Secondary | ICD-10-CM | POA: Diagnosis not present

## 2018-02-08 DIAGNOSIS — J069 Acute upper respiratory infection, unspecified: Secondary | ICD-10-CM | POA: Diagnosis not present

## 2018-02-19 DIAGNOSIS — H6692 Otitis media, unspecified, left ear: Secondary | ICD-10-CM | POA: Diagnosis not present

## 2018-02-25 DIAGNOSIS — H5213 Myopia, bilateral: Secondary | ICD-10-CM | POA: Diagnosis not present

## 2018-02-25 DIAGNOSIS — H25013 Cortical age-related cataract, bilateral: Secondary | ICD-10-CM | POA: Diagnosis not present

## 2018-02-25 DIAGNOSIS — H2513 Age-related nuclear cataract, bilateral: Secondary | ICD-10-CM | POA: Diagnosis not present

## 2018-03-04 DIAGNOSIS — R799 Abnormal finding of blood chemistry, unspecified: Secondary | ICD-10-CM | POA: Diagnosis not present

## 2018-03-31 DIAGNOSIS — D72820 Lymphocytosis (symptomatic): Secondary | ICD-10-CM | POA: Diagnosis not present

## 2018-06-16 DIAGNOSIS — E538 Deficiency of other specified B group vitamins: Secondary | ICD-10-CM | POA: Diagnosis not present

## 2018-06-30 DIAGNOSIS — Z0100 Encounter for examination of eyes and vision without abnormal findings: Secondary | ICD-10-CM | POA: Diagnosis not present

## 2018-07-16 DIAGNOSIS — E538 Deficiency of other specified B group vitamins: Secondary | ICD-10-CM | POA: Diagnosis not present

## 2018-07-30 DIAGNOSIS — E538 Deficiency of other specified B group vitamins: Secondary | ICD-10-CM | POA: Diagnosis not present

## 2018-08-18 DIAGNOSIS — G8929 Other chronic pain: Secondary | ICD-10-CM | POA: Diagnosis not present

## 2018-08-18 DIAGNOSIS — F419 Anxiety disorder, unspecified: Secondary | ICD-10-CM | POA: Diagnosis not present

## 2018-08-18 DIAGNOSIS — I10 Essential (primary) hypertension: Secondary | ICD-10-CM | POA: Diagnosis not present

## 2018-08-18 DIAGNOSIS — E039 Hypothyroidism, unspecified: Secondary | ICD-10-CM | POA: Diagnosis not present

## 2018-08-18 DIAGNOSIS — F329 Major depressive disorder, single episode, unspecified: Secondary | ICD-10-CM | POA: Diagnosis not present

## 2018-08-18 DIAGNOSIS — E785 Hyperlipidemia, unspecified: Secondary | ICD-10-CM | POA: Diagnosis not present

## 2018-08-18 DIAGNOSIS — D72820 Lymphocytosis (symptomatic): Secondary | ICD-10-CM | POA: Diagnosis not present

## 2018-08-18 DIAGNOSIS — E538 Deficiency of other specified B group vitamins: Secondary | ICD-10-CM | POA: Diagnosis not present

## 2018-08-28 ENCOUNTER — Telehealth: Payer: Self-pay | Admitting: Hematology & Oncology

## 2018-08-28 NOTE — Telephone Encounter (Signed)
lmom to inform pt of new patient appt 7/10 at 1030.

## 2018-09-04 DIAGNOSIS — E538 Deficiency of other specified B group vitamins: Secondary | ICD-10-CM | POA: Diagnosis not present

## 2018-09-17 DIAGNOSIS — E538 Deficiency of other specified B group vitamins: Secondary | ICD-10-CM | POA: Diagnosis not present

## 2018-09-24 ENCOUNTER — Telehealth: Payer: Self-pay | Admitting: *Deleted

## 2018-09-24 NOTE — Telephone Encounter (Signed)
Pt called to confirm appt times for tomorrow, 09/25/18.  Times reviewed with patient.  Patient appreciative of assistance and has no further questions or concerns at this time.

## 2018-09-25 ENCOUNTER — Encounter: Payer: Self-pay | Admitting: Hematology & Oncology

## 2018-09-25 ENCOUNTER — Other Ambulatory Visit: Payer: Self-pay

## 2018-09-25 ENCOUNTER — Inpatient Hospital Stay: Payer: Medicare HMO

## 2018-09-25 ENCOUNTER — Inpatient Hospital Stay: Payer: Medicare HMO | Attending: Hematology & Oncology | Admitting: Hematology & Oncology

## 2018-09-25 VITALS — BP 130/86 | HR 86 | Temp 98.6°F | Resp 18 | Wt 187.0 lb

## 2018-09-25 DIAGNOSIS — E079 Disorder of thyroid, unspecified: Secondary | ICD-10-CM

## 2018-09-25 DIAGNOSIS — Z90710 Acquired absence of both cervix and uterus: Secondary | ICD-10-CM

## 2018-09-25 DIAGNOSIS — E785 Hyperlipidemia, unspecified: Secondary | ICD-10-CM

## 2018-09-25 DIAGNOSIS — Z8673 Personal history of transient ischemic attack (TIA), and cerebral infarction without residual deficits: Secondary | ICD-10-CM

## 2018-09-25 DIAGNOSIS — F329 Major depressive disorder, single episode, unspecified: Secondary | ICD-10-CM | POA: Insufficient documentation

## 2018-09-25 DIAGNOSIS — Z79899 Other long term (current) drug therapy: Secondary | ICD-10-CM | POA: Insufficient documentation

## 2018-09-25 DIAGNOSIS — M1990 Unspecified osteoarthritis, unspecified site: Secondary | ICD-10-CM | POA: Insufficient documentation

## 2018-09-25 DIAGNOSIS — Z87891 Personal history of nicotine dependence: Secondary | ICD-10-CM | POA: Insufficient documentation

## 2018-09-25 DIAGNOSIS — Z7982 Long term (current) use of aspirin: Secondary | ICD-10-CM | POA: Insufficient documentation

## 2018-09-25 DIAGNOSIS — R51 Headache: Secondary | ICD-10-CM

## 2018-09-25 DIAGNOSIS — D72820 Lymphocytosis (symptomatic): Secondary | ICD-10-CM | POA: Diagnosis not present

## 2018-09-25 DIAGNOSIS — I10 Essential (primary) hypertension: Secondary | ICD-10-CM | POA: Insufficient documentation

## 2018-09-25 LAB — CBC WITH DIFFERENTIAL (CANCER CENTER ONLY)
Abs Immature Granulocytes: 0.03 10*3/uL (ref 0.00–0.07)
Basophils Absolute: 0.1 10*3/uL (ref 0.0–0.1)
Basophils Relative: 1 %
Eosinophils Absolute: 0.2 10*3/uL (ref 0.0–0.5)
Eosinophils Relative: 2 %
HCT: 46.4 % — ABNORMAL HIGH (ref 36.0–46.0)
Hemoglobin: 15.4 g/dL — ABNORMAL HIGH (ref 12.0–15.0)
Immature Granulocytes: 0 %
Lymphocytes Relative: 36 %
Lymphs Abs: 3.5 10*3/uL (ref 0.7–4.0)
MCH: 30.9 pg (ref 26.0–34.0)
MCHC: 33.2 g/dL (ref 30.0–36.0)
MCV: 93 fL (ref 80.0–100.0)
Monocytes Absolute: 0.6 10*3/uL (ref 0.1–1.0)
Monocytes Relative: 6 %
Neutro Abs: 5.4 10*3/uL (ref 1.7–7.7)
Neutrophils Relative %: 55 %
Platelet Count: 227 10*3/uL (ref 150–400)
RBC: 4.99 MIL/uL (ref 3.87–5.11)
RDW: 12.9 % (ref 11.5–15.5)
WBC Count: 9.7 10*3/uL (ref 4.0–10.5)
nRBC: 0 % (ref 0.0–0.2)

## 2018-09-25 LAB — SAVE SMEAR (SSMR)

## 2018-09-25 LAB — LACTATE DEHYDROGENASE: LDH: 177 U/L (ref 98–192)

## 2018-09-25 NOTE — Progress Notes (Signed)
Referral MD  Reason for Referral: Chronic lymphocytosis-mild  Chief Complaint  Patient presents with  . Follow-up  : I was told to come here because of my blood.  HPI: [name] is a very nice 73 year old white female.  She has several health issues.  She is followed in [place].  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  She does chores for her and helps around the house.  She has had chronic lymphocytosis.  Going back for a year or so, her lymphocytes have been minimally elevated.  She really has not had any leukocytosis.  She is had no problem with infections.  She has had no swollen lymph nodes.  She has had no weight loss.  There is been no rashes..  She does have bad osteoarthritis.  There is been no change in bowel or bladder habits.  She has had no issues with cough..  She smoked probably about 30 years ago or so.  She has had a hysterectomy.  This was back when she was 73 years old.  She has had no bleeding.  There is been occasional headache.  She is not a vegetarian.  She eats regular diet.  She has had no problems with any rash.  Overall, I would say her performance status is ECOG 1.   Past Medical History:  Diagnosis Date  . Anxiety   . Arthritis   . Depression   . Hyperlipemia   . Hypertension   . Thyroid disease   . TIA (transient ischemic attack)   :  Past Surgical History:  Procedure Laterality Date  . ABDOMINAL HYSTERECTOMY    . BREAST BIOPSY    . TUBAL LIGATION    :   Current Outpatient Medications:  .  ALPRAZolam (XANAX) 0.5 MG tablet, Take 1 tablet by mouth daily as needed for anxiety., Disp: , Rfl:  .  amLODipine (NORVASC) 10 MG tablet, Take 10 mg by mouth at bedtime., Disp: , Rfl:  .  aspirin EC 81 MG tablet, Take 81 mg by mouth daily., Disp: , Rfl:  .  cyanocobalamin (,VITAMIN B-12,) 1000 MCG/ML injection, Inject 1,000 mcg into the muscle every 14 (fourteen) days. , Disp: , Rfl:  .  escitalopram (LEXAPRO) 10 MG tablet,  Take 10 mg by mouth daily., Disp: , Rfl:  .  levothyroxine (SYNTHROID) 125 MCG tablet, Take 125 mcg by mouth daily., Disp: , Rfl:  .  mupirocin ointment (BACTROBAN) 2 %, mupirocin 2 % topical ointment, Disp: , Rfl: :  :  Allergies  Allergen Reactions  . Penicillins Hives and Other (See Comments)    Has patient had a PCN reaction causing immediate rash, facial/tongue/throat swelling, SOB or lightheadedness with hypotension: No Has patient had a PCN reaction causing severe rash involving mucus membranes or skin necrosis: No Has patient had a PCN reaction that required hospitalization No Has patient had a PCN reaction occurring within the last 10 years: No If all of the above answers are "NO", then may proceed with Cephalosporin use.  :  Family History  Problem Relation Age of Onset  . Alcohol abuse Brother   . Heart disease Brother   . Diabetes Mother   . Alzheimer's disease Mother   . Dementia Mother   . COPD Father   . Emphysema Father   . Heart attack Daughter   . Heart disease Sister   . Emphysema Brother   :  Social History   Socioeconomic History  . Marital status: Married  Spouse name: Not on file  . Number of children: Not on file  . Years of education: Not on file  . Highest education level: Not on file  Occupational History  . Occupation: retired    Comment: Geophysicist/field seismologist  . Occupation: caregiver  Social Needs  . Financial resource strain: Not on file  . Food insecurity    Worry: Not on file    Inability: Not on file  . Transportation needs    Medical: Not on file    Non-medical: Not on file  Tobacco Use  . Smoking status: Former Smoker    Quit date: 05/25/1987    Years since quitting: 31.3  . Smokeless tobacco: Never Used  Substance and Sexual Activity  . Alcohol use: Yes    Comment: 3 glasses of wine daily  . Drug use: No  . Sexual activity: Never  Lifestyle  . Physical activity    Days per week: Not on file    Minutes per session: Not on file   . Stress: Not on file  Relationships  . Social Herbalist on phone: Not on file    Gets together: Not on file    Attends religious service: Not on file    Active member of club or organization: Not on file    Attends meetings of clubs or organizations: Not on file    Relationship status: Not on file  . Intimate partner violence    Fear of current or ex partner: Not on file    Emotionally abused: Not on file    Physically abused: Not on file    Forced sexual activity: Not on file  Other Topics Concern  . Not on file  Social History Narrative  . Not on file  :  Review of Systems  Constitutional: Negative.   HENT: Negative.   Eyes: Negative.   Respiratory: Negative.   Cardiovascular: Negative.   Gastrointestinal: Negative.   Genitourinary: Negative.   Musculoskeletal: Positive for joint pain, myalgias and neck pain.  Skin: Negative.   Neurological: Negative.   Endo/Heme/Allergies: Negative.   Psychiatric/Behavioral: Negative.      Exam: Fairly well-developed well-nourished white female in no obvious distress.  Vital signs show temperature of 98.6.  Pulse is 86.  Blood pressure 130/86.  Weight is 187 pounds.  Head neck exam shows no ocular or oral lesions.  She has no scleral icterus.  There is no adenopathy in the neck or supraclavicular region.  Lungs are clear bilaterally.  Cardiac exam regular rate and rhythm with no murmurs, rubs or bruits.  Abdomen is soft.  She has good bowel sounds.  There is no fluid wave.  There is no palpable inguinal adenopathy.  She has no palpable liver or spleen tip.  Axillary exam shows no bilateral axillary adenopathy.  Back exam shows no tenderness over the spine, ribs or hips.  Extremities shows minimal nonpitting edema in the lower legs.  She has decent range of motion of the joints.  She has osteoarthritic changes in her joints.  Skin exam shows some sun damage on her skin.  There is no ecchymoses or petechia.  Neurological exam is  nonfocal. @IPVITALS @   Recent Labs    09/25/18 1004  WBC 9.7  HGB 15.4*  HCT 46.4*  PLT 227   No results for input(s): NA, K, CL, CO2, GLUCOSE, BUN, CREATININE, CALCIUM in the last 72 hours.  Blood smear review: Normochromic and normocytic population of red blood cells.  There are no nucleated red blood cells.  I see no teardrop cells.  There is no schistocytes.  She has no rouleaux formation.  White cells appear normal in morphology.  She may have a few large lymphocytes.  There may be a rare smudge cell.  She has no immature myeloid or lymphoid cells.  Platelets are adequate in number and size.  Pathology: None    Assessment and Plan: [name] is a very nice 73 year old white female with minimal lymphocytosis.  Again, her white cells were not elevated.  The percentage of lymphocytes is not elevated.  However, when the white cells are multiplied by the lymphocyte percentage, there is a minimal elevation of lymphocytes.  By her blood smear, I would be surprised if she did have any underlying problem.  I did send off flow cytometry on the peripheral blood so that I can be all the thorough side to make sure that there is no monoclonal population of cells.  I cannot find anything on her examination that would suggest a lymphoproliferative process.  I spent about 45 minutes with her.  She is very nice.  I just do not think that we have a hematologic problem here.  Her lymphocytes really are not going to be a problem from my point of view.  There really is not a clinical lymphocytosis from my point of view.  The actual number might be on the higher side but this is more an issue of "mathematics."  At this time, I just do not think we have to get her back to the office.  Again, if the flow cytometry does show [place] that there is an issue, then we will get her back.  I did give her a prayer blanket.  She does have a strong faith.  We had good fellowship during our meeting.

## 2018-09-29 DIAGNOSIS — E538 Deficiency of other specified B group vitamins: Secondary | ICD-10-CM | POA: Diagnosis not present

## 2018-09-30 ENCOUNTER — Telehealth: Payer: Self-pay | Admitting: *Deleted

## 2018-09-30 NOTE — Telephone Encounter (Addendum)
Patient is aware of results  ----- Message from Volanda Napoleon, MD sent at 09/30/2018  9:08 AM EDT ----- Call - the blood does NOT show any leukemia!!!  This is wonderful news!!!  Laurey Arrow

## 2018-10-01 DIAGNOSIS — Z1231 Encounter for screening mammogram for malignant neoplasm of breast: Secondary | ICD-10-CM | POA: Diagnosis not present

## 2018-10-01 DIAGNOSIS — Z8262 Family history of osteoporosis: Secondary | ICD-10-CM | POA: Diagnosis not present

## 2018-10-01 DIAGNOSIS — R29890 Loss of height: Secondary | ICD-10-CM | POA: Diagnosis not present

## 2018-10-01 DIAGNOSIS — M8589 Other specified disorders of bone density and structure, multiple sites: Secondary | ICD-10-CM | POA: Diagnosis not present

## 2018-10-01 DIAGNOSIS — Z90710 Acquired absence of both cervix and uterus: Secondary | ICD-10-CM | POA: Diagnosis not present

## 2018-10-02 LAB — FLOW CYTOMETRY

## 2018-10-14 DIAGNOSIS — F419 Anxiety disorder, unspecified: Secondary | ICD-10-CM | POA: Diagnosis not present

## 2018-10-14 DIAGNOSIS — I10 Essential (primary) hypertension: Secondary | ICD-10-CM | POA: Diagnosis not present

## 2018-10-14 DIAGNOSIS — E538 Deficiency of other specified B group vitamins: Secondary | ICD-10-CM | POA: Diagnosis not present

## 2018-10-14 DIAGNOSIS — E039 Hypothyroidism, unspecified: Secondary | ICD-10-CM | POA: Diagnosis not present

## 2018-10-14 DIAGNOSIS — K219 Gastro-esophageal reflux disease without esophagitis: Secondary | ICD-10-CM | POA: Diagnosis not present

## 2018-10-14 DIAGNOSIS — E785 Hyperlipidemia, unspecified: Secondary | ICD-10-CM | POA: Diagnosis not present

## 2018-10-28 DIAGNOSIS — E538 Deficiency of other specified B group vitamins: Secondary | ICD-10-CM | POA: Diagnosis not present

## 2018-11-11 DIAGNOSIS — E538 Deficiency of other specified B group vitamins: Secondary | ICD-10-CM | POA: Diagnosis not present

## 2018-11-25 DIAGNOSIS — E538 Deficiency of other specified B group vitamins: Secondary | ICD-10-CM | POA: Diagnosis not present

## 2018-12-09 DIAGNOSIS — E538 Deficiency of other specified B group vitamins: Secondary | ICD-10-CM | POA: Diagnosis not present

## 2018-12-23 DIAGNOSIS — R0981 Nasal congestion: Secondary | ICD-10-CM | POA: Diagnosis not present

## 2018-12-30 DIAGNOSIS — E538 Deficiency of other specified B group vitamins: Secondary | ICD-10-CM | POA: Diagnosis not present

## 2019-01-13 DIAGNOSIS — E538 Deficiency of other specified B group vitamins: Secondary | ICD-10-CM | POA: Diagnosis not present

## 2019-01-13 DIAGNOSIS — Z1211 Encounter for screening for malignant neoplasm of colon: Secondary | ICD-10-CM | POA: Diagnosis not present

## 2019-01-13 DIAGNOSIS — H8112 Benign paroxysmal vertigo, left ear: Secondary | ICD-10-CM | POA: Diagnosis not present

## 2019-01-22 DIAGNOSIS — Z1211 Encounter for screening for malignant neoplasm of colon: Secondary | ICD-10-CM | POA: Diagnosis not present

## 2019-01-29 DIAGNOSIS — E538 Deficiency of other specified B group vitamins: Secondary | ICD-10-CM | POA: Diagnosis not present

## 2019-02-02 ENCOUNTER — Encounter: Payer: Self-pay | Admitting: Neurology

## 2019-02-02 ENCOUNTER — Ambulatory Visit (INDEPENDENT_AMBULATORY_CARE_PROVIDER_SITE_OTHER): Payer: Medicare HMO | Admitting: Neurology

## 2019-02-02 ENCOUNTER — Other Ambulatory Visit: Payer: Self-pay

## 2019-02-02 DIAGNOSIS — H81399 Other peripheral vertigo, unspecified ear: Secondary | ICD-10-CM

## 2019-02-02 NOTE — Progress Notes (Signed)
Subjective:    Patient ID: [name] is a 73 y.o. female.  HPI     Toni Age, MD, PhD [facility] [address] [place], [place] [address]  Dear Dr. Curly Rim,   I saw your patient, [name], upon your kind request, in my neurologic clinic today for initial consultation of her vertigo.  The patient is unaccompanied today.  As you know, [name] is a 73 year old right-handed woman with an underlying medical history of hypertension, hyperlipidemia, hypothyroidism, status post radioactive iodine therapy, history of TIA, osteoarthritis, depression, and B12 deficiency, who reports intermittent positional dizziness/vertiginous symptom for the past approx. 4 weeks. Symptoms started fairly abruptly, and are not progressive, no N/V, no recurrent headaches, no one sided weakness, slurring of speech, numbness, or droopy face. She has not fallen, thankfully. She has a walker but does not typically use it. She admits, that she has not been drinking a whole lot of fluids, averages about 2 bottles of water per day, 16 oz each. She does drink alcohol daily, about 2 glasses of wine per day.  She reports that she has not been recently encouraged to reduce it but has in the past been encouraged to reduce her alcohol intake.  She denies any hearing loss, in fact, she feels that her ears are more sensitive and it helps to keep them plugged up with tissue paper.  She takes the tissue paper out for the examination today.  She reports that she was given exercises to do at home for her vertigo.  She felt worse.  She has not had any physical therapy.  She has not seen ENT. I reviewed your office note from 01/13/2019, which you kindly included.  I have previously evaluated her for suspected TIA several years ago.  She reported intermittent left sided face, arm and leg numbness over the course of several months at the time.  She had stroke/TIA work-up in the hospital  in March 2014.  Previously:  11/09/2012: 73 year old right-handed woman with an underlying medical history of hypertension, hyperlipidemia, hypothyroidism, depression, arthritis who has been experiencing intermittent left face arm and leg numbness for the past several months. She was seen at [facility] ER on 05/23/2012 where she had workup including head CT which was negative, an MRI and MRA brain which were negative, C. doppler, which were negative and a echocardiogram, with EF of 50-55% and no thrombus. She was told that she had had a TIA. While her symptoms are intermittent she feels that her left side does not feel the same as the right. The hospital physician recommended that she take folic acid, thiamine and pantoprazole which she had not started. She was also instructed to cut back on her alcohol. She has cut back but indicated that she will not stop cold [place]. In March you started her on clonazepam for anxiety. She was advised to start pantoprazole for gastroesophageal reflux. He did advise her to start thiamine for heavy alcohol consumption as well as folic acid and baby aspirin. She takes ASA 325 mg, but not daily. She estimates that she takes it 5 times a week. She has reduced her alcohol consumption to 2 glasses of wine per day. she quit smoking some 15 years ago. She denies any illicit drug use. She had a sleep study several years ago and was given a CPAP machine, but did not tolerate CPAP at the time.  Her LDL was 138 in March 2014 and she  is on Zocor, but was not taking it regularly at the time.  In May she had similar symptoms and on 10/10/2012 she presented back to the walk-in clinic at [facility] with paresthesias in the left jaw, left arm. She was treated with a Medrol Dosepak and advised that if symptoms were not improved that she should go to the emergency room. She did not get that Rx for the steroid filled.  She has intermittent numbness, none at this moment and denies  LOC or convulsion.  She has a head and hand tremor and a FHx of tremor in her mother and her sisters.  Her Past Medical History Is Significant For: Past Medical History:  Diagnosis Date  . Anxiety   . Arthritis   . Depression   . Hyperlipemia   . Hypertension   . Thyroid disease   . TIA (transient ischemic attack)     Her Past Surgical History Is Significant For: Past Surgical History:  Procedure Laterality Date  . ABDOMINAL HYSTERECTOMY    . BREAST BIOPSY    . TUBAL LIGATION      Her Family History Is Significant For: Family History  Problem Relation [name] of Onset  . Alcohol abuse Brother   . Heart disease Brother   . Diabetes Mother   . Alzheimer's disease Mother   . Dementia Mother   . COPD Father   . Emphysema Father   . Heart attack Daughter   . Heart disease Sister   . Emphysema Brother     Her Social History Is Significant For: Social History   Socioeconomic History  . Marital status: Married    Spouse name: Not on file  . Number of children: Not on file  . Years of education: Not on file  . Highest education level: Not on file  Occupational History  . Occupation: retired    Comment: Geophysicist/field seismologist  . Occupation: caregiver  Social Needs  . Financial resource strain: Not on file  . Food insecurity    Worry: Not on file    Inability: Not on file  . Transportation needs    Medical: Not on file    Non-medical: Not on file  Tobacco Use  . Smoking status: Former Smoker    Quit date: 05/25/1987    Years since quitting: 31.7  . Smokeless tobacco: Never Used  Substance and Sexual Activity  . Alcohol use: Yes    Comment: 3 glasses of wine daily  . Drug use: No  . Sexual activity: Never  Lifestyle  . Physical activity    Days per week: Not on file    Minutes per session: Not on file  . Stress: Not on file  Relationships  . Social Herbalist on phone: Not on file    Gets together: Not on file    Attends religious service: Not on file     Active member of club or organization: Not on file    Attends meetings of clubs or organizations: Not on file    Relationship status: Not on file  Other Topics Concern  . Not on file  Social History Narrative  . Not on file    Her Allergies Are:  Allergies  Allergen Reactions  . Penicillins Hives and Other (See Comments)    Has patient had a PCN reaction causing immediate rash, facial/tongue/throat swelling, SOB or lightheadedness with hypotension: No Has patient had a PCN reaction causing severe rash involving mucus membranes or skin necrosis:  No Has patient had a PCN reaction that required hospitalization No Has patient had a PCN reaction occurring within the last 10 years: No If all of the above answers are "NO", then may proceed with Cephalosporin use.  :   Her Current Medications Are:  Outpatient Encounter Medications as of 02/02/2019  Medication Sig  . ALPRAZolam (XANAX) 0.5 MG tablet Take 1 tablet by mouth daily as needed for anxiety.  Marland Kitchen amLODipine (NORVASC) 10 MG tablet Take 10 mg by mouth at bedtime.  Marland Kitchen aspirin EC 81 MG tablet Take 81 mg by mouth daily.  . cyanocobalamin (,VITAMIN B-12,) 1000 MCG/ML injection Inject 1,000 mcg into the muscle every 14 (fourteen) days.   Marland Kitchen escitalopram (LEXAPRO) 10 MG tablet Take 10 mg by mouth daily.  Marland Kitchen levothyroxine (SYNTHROID) 125 MCG tablet Take 125 mcg by mouth daily.  . mupirocin ointment (BACTROBAN) 2 % mupirocin 2 % topical ointment   No facility-administered encounter medications on file as of 02/02/2019.   :   Review of Systems:  Out of a complete 14 point review of systems, all are reviewed and negative with the exception of these symptoms as listed below:  Review of Systems  Neurological:       Pt presents today to discuss her 4 weeks of vertigo. Pt has had a sleep study in the past. Pt does endorse snoring.  Epworth Sleepiness Scale 0= would never doze 1= slight chance of dozing 2= moderate chance of dozing 3= high  chance of dozing  Sitting and reading: 0 Watching TV: 0 Sitting inactive in a public place (ex. Theater or meeting): 0 As a passenger in a car for an hour without a break: 1 Lying down to rest in the afternoon: 3 Sitting and talking to someone: 0 Sitting quietly after lunch (no alcohol): 0 In a car, while stopped in traffic: 0 Total: 4     Objective:  Neurological Exam  Physical Exam Physical Examination:   Vitals:   02/02/19 1144  BP: 133/88  Pulse: 86   General Examination: The patient is a very pleasant 73 y.o. female in no acute distress. She appears well-developed and well-nourished and well groomed.On orthostatic testing, lying blood pressure and pulse was 123/82 with a pulse of 79, sitting 133/88 with a pulse of 86, standing 127/87 with a pulse of 93.  She denies orthostatic lightheadedness.   HEENT: Normocephalic, atraumatic, pupils are equal, round and reactive to light and accommodation.Funduscopic exam is not fully possible secondary to dense cataract, right more than left.  Hearing is grossly intact, tympanic membranes are clear bilaterally. Extraocular tracking is preserved, no nystagmus is noted, no vertiginous spells, no sudden episode of spinning sensation with changes in head position, no carotid bruits, airway examination reveals mild mouth dryness, no significant abnormality, tongue protrudes centrally in palate elevates symmetrically, facial animation is normal, facial sensation is normal, she has a head tremor.  Chest: Clear to auscultation without wheezing, rhonchi or crackles noted.  Heart: S1+S2+0, regular and normal without murmurs, rubs or gallops noted.   Abdomen: Soft, non-tender and non-distended with normal bowel sounds appreciated on auscultation.  Extremities: There is trace pitting edema in the distal lower extremities bilaterally. Pedal pulses are intact.  Skin: Warm and dry without trophic changes noted. There are no varicose  veins.  Musculoskeletal: exam reveals Prominent arthritic changes in both hands, right more than left, knee pain bilaterally, right more than left.   Neurologically:  Mental status: The patient is awake, alert and  oriented in all 4 spheres. Her memory, attention, language and knowledge are fairly appropriate. There is no aphasia, agnosia, apraxia or anomia. Speech is clear with normal prosody and enunciation. Thought process is linear. Mood is congruent and affect is constricted.  Cranial nerves are as described above under HEENT exam. In addition, shoulder shrug is normal with equal shoulder height noted. Motor exam: Normal bulk, strength and tone is noted. There is no drift, or rebound. She has a Mild bilateral upper extremity postural and action tremor, no intention tremor, no resting tremor.  Romberg is not tested for safety concerns, reflexes are 2+ in the upper extremities, 1+ in the lower extremities incl. ankles, toes are downgoing.  Fine motor skills are grossly intact.   Cerebellar testing shows no dysmetria or intention tremor.  Sensory exam is intact to light touch in the upper and lower extremities.  Gait, station and balance: She stands up slowly, she stands slightly wide-based, she walks slowly and cautiously.  She has slight insecurity with turns.  No shuffling, preserved arm swing.   Assessment and Plan:   In summary, BIJAL KIEHL is a very pleasant 73 year old female with an underlying medical history of hypertension, hyperlipidemia, hypothyroidism, status post radioactive iodine therapy, history of TIA, osteoarthritis, depression, and B12 deficiency, who Presents for evaluation of her vertigo.  She has had intermittent symptoms for the past 4 weeks.  Symptoms are not progressive, she has had some sensitivity to sound.  She has no obvious neurological abnormality on examination today other than a history of tremor and she has a nonspecific gait insecurity.  She reports that  her balance has not been good for quite some time.  In the past, she was encouraged to reduce her alcohol intake but reports that she continues to drink about 2 glasses of wine per day.  She also admits that she does not hydrate very well.  These could be contributing issues to her balance problem and vertigo spells.  Nevertheless, I would recommend that we proceed with a brain MRI with and without contrast.  We will check her kidney function prior to this test.  In addition, I recommend evaluation through ENT because of her ear hearing issue and sensitivity to sound.  I would also recommendPhysical therapy referral for formal vestibular rehab.  She is agreeable and I made a referral.  I ordered her brain MRI and we will call her with her results.  Today, I recommended that she reduce her alcohol consumption and limit herself to up to 1 glass of wine per day, she increase her water intake to 3-4 bottles per day, 16 ounce each, and that she use her walker at all times for gait safety. So long as her MRI shows no acute findings, we will keep her posted by phone call and I plan to see her back on an as-needed basis.  I answered all her questions today and she was in agreement. Thank you very much for allowing me to participate in the care of this nice patient. If I can be of any further assistance to you please do not hesitate to call me at [phone].  Sincerely,   Toni Age, MD, PhD

## 2019-02-02 NOTE — Patient Instructions (Addendum)
I do not see any obvious neurological cause for your vertigo.  You may have positional vertigo.  I would recommend that you consult with an ENT, please talk to your primary care physician about a referral.  I would like for you to do physical therapy for vertigo, it is called vestibular rehab, we will make a referral to physical therapy close to your home in [place]. I would also recommend we proceed with a brain MRI with and without contrast.  Please try to hydrate better with water, 3-4 bottles of water per day are recommended, 16 ounce each.  Please reduce your alcohol intake, limit yourself to less than 1 glass of wine per day on average. Please start using your walker for gait safety, your balance is impaired. We will call you with your brain MRI results, so long as there is no acute findings on your MRI, I will see you back as needed.

## 2019-02-03 LAB — COMPREHENSIVE METABOLIC PANEL
ALT: 29 IU/L (ref 0–32)
AST: 18 IU/L (ref 0–40)
Albumin/Globulin Ratio: 2 (ref 1.2–2.2)
Albumin: 4.5 g/dL (ref 3.7–4.7)
Alkaline Phosphatase: 100 IU/L (ref 39–117)
BUN/Creatinine Ratio: 15 (ref 12–28)
BUN: 14 mg/dL (ref 8–27)
Bilirubin Total: 0.5 mg/dL (ref 0.0–1.2)
CO2: 22 mmol/L (ref 20–29)
Calcium: 10.1 mg/dL (ref 8.7–10.3)
Chloride: 102 mmol/L (ref 96–106)
Creatinine, Ser: 0.91 mg/dL (ref 0.57–1.00)
GFR calc Af Amer: 71 mL/min/{1.73_m2} (ref >59)
GFR calc non Af Amer: 62 mL/min/{1.73_m2} (ref >59)
Globulin, Total: 2.2 g/dL (ref 1.5–4.5)
Glucose: 140 mg/dL — ABNORMAL HIGH (ref 65–99)
Potassium: 5 mmol/L (ref 3.5–5.2)
Sodium: 139 mmol/L (ref 134–144)
Total Protein: 6.7 g/dL (ref 6.0–8.5)

## 2019-02-04 ENCOUNTER — Telehealth: Payer: Self-pay

## 2019-02-04 NOTE — Telephone Encounter (Signed)
I called [name] and discussed these results and recommendations with her. She asked that I send a copy of her results to Mat Carne, [place] at [place]. [name] verbalized understanding of results. [name] had no questions at this time but was encouraged to call back if questions arise.

## 2019-02-04 NOTE — Telephone Encounter (Signed)
-----   Message from Star Age, MD sent at 02/04/2019  9:19 AM EST ----- Recent blood test showed that her kidney function is okay and we can proceed with a brain MRI.  Nevertheless, blood sugar value was a little high at 140.  I would like for her to follow-up with her primary care physician regarding blood sugar management. Please update pt. [name]

## 2019-02-04 NOTE — Progress Notes (Signed)
Recent blood test showed that her kidney function is okay and we can proceed with a brain MRI.  Nevertheless, blood sugar value was a little high at 140.  I would like for her to follow-up with her primary care physician regarding blood sugar management. Please update pt. [name]

## 2019-02-08 ENCOUNTER — Telehealth: Payer: Self-pay

## 2019-02-08 NOTE — Telephone Encounter (Signed)
[employer] Josem Kaufmann is pending clinical review. I have faxed OV notes to [facility] to be reviewed. Tracking #: [id number].

## 2019-02-17 DIAGNOSIS — E538 Deficiency of other specified B group vitamins: Secondary | ICD-10-CM | POA: Diagnosis not present

## 2019-03-05 DIAGNOSIS — E538 Deficiency of other specified B group vitamins: Secondary | ICD-10-CM | POA: Diagnosis not present

## 2019-03-08 ENCOUNTER — Other Ambulatory Visit: Payer: Self-pay

## 2019-03-08 ENCOUNTER — Ambulatory Visit
Admission: RE | Admit: 2019-03-08 | Discharge: 2019-03-08 | Disposition: A | Discharge status: Home / Self Care | Payer: Medicare HMO | Source: Ambulatory Visit | Attending: Neurology | Admitting: Neurology

## 2019-03-08 DIAGNOSIS — H81399 Other peripheral vertigo, unspecified ear: Secondary | ICD-10-CM

## 2019-03-08 MED ORDER — GADOBENATE DIMEGLUMINE 529 MG/ML IV SOLN
17.0000 mL | Freq: Once | INTRAVENOUS | Status: AC | PRN
  Administered 2019-03-08: 17 mL via INTRAVENOUS

## 2019-03-10 ENCOUNTER — Telehealth: Payer: Self-pay | Admitting: *Deleted

## 2019-03-10 NOTE — Telephone Encounter (Signed)
Called and spoke with pt about MRI results per Dr. Rexene Alberts note. Pt verbalized understanding. She requested report be sent to her PCP, Mat Carne, NP. I forwarded via Epic.

## 2019-03-10 NOTE — Telephone Encounter (Signed)
-----   Message from Hope Pigeon, RN sent at 03/10/2019  9:28 AM EST -----  ----- Message ----- From: Star Age, MD Sent: 03/10/2019   8:41 AM EST To: Darleen Crocker, RN  Please call patient regarding the recent brain MRI: The brain scan showed a normal structure of the brain and mild volume loss which we call atrophy. There were changes in the deeper structures of the brain, which we call white matter changes or microvascular changes. These were reported as moderate in Her case. These are tiny white spots, that occur with time and are seen in a variety of conditions, including with normal aging, chronic hypertension, chronic headaches, especially migraine HAs, chronic diabetes, chronic hyperlipidemia. These are not strokes and no mass or lesion or contrast enhancement was seen which is reassuring. Again, there were no acute findings, such as a stroke, or mass or blood products. No further action is required on this test at this time, other than re-enforcing the importance of good blood pressure control, good cholesterol control, good blood sugar control, and weight management.  As discussed, the patient can follow-up with her primary care physician or PA and I can see her back on an as-needed basis at this point.  Star Age, MD, PhD

## 2019-03-10 NOTE — Progress Notes (Signed)
Please call patient regarding the recent brain MRI: The brain scan showed a normal structure of the brain and mild volume loss which we call atrophy. There were changes in the deeper structures of the brain, which we call white matter changes or microvascular changes. These were reported as moderate in Her case. These are tiny white spots, that occur with time and are seen in a variety of conditions, including with normal aging, chronic hypertension, chronic headaches, especially migraine HAs, chronic diabetes, chronic hyperlipidemia. These are not strokes and no mass or lesion or contrast enhancement was seen which is reassuring. Again, there were no acute findings, such as a stroke, or mass or blood products. No further action is required on this test at this time, other than re-enforcing the importance of good blood pressure control, good cholesterol control, good blood sugar control, and weight management.  As discussed, the patient can follow-up with her primary care physician or PA and I can see her back on an as-needed basis at this point.  Star Age, MD, PhD

## 2019-03-17 DIAGNOSIS — E538 Deficiency of other specified B group vitamins: Secondary | ICD-10-CM | POA: Diagnosis not present

## 2019-03-22 DIAGNOSIS — R42 Dizziness and giddiness: Secondary | ICD-10-CM | POA: Diagnosis not present

## 2019-03-22 DIAGNOSIS — W19XXXA Unspecified fall, initial encounter: Secondary | ICD-10-CM | POA: Diagnosis not present

## 2019-04-07 DIAGNOSIS — E538 Deficiency of other specified B group vitamins: Secondary | ICD-10-CM | POA: Diagnosis not present

## 2019-04-13 DIAGNOSIS — H9209 Otalgia, unspecified ear: Secondary | ICD-10-CM | POA: Diagnosis not present

## 2019-04-13 DIAGNOSIS — H9313 Tinnitus, bilateral: Secondary | ICD-10-CM | POA: Diagnosis not present

## 2019-04-13 DIAGNOSIS — H9113 Presbycusis, bilateral: Secondary | ICD-10-CM | POA: Insufficient documentation

## 2019-04-13 DIAGNOSIS — H833X3 Noise effects on inner ear, bilateral: Secondary | ICD-10-CM | POA: Diagnosis not present

## 2019-04-13 DIAGNOSIS — R42 Dizziness and giddiness: Secondary | ICD-10-CM | POA: Diagnosis not present

## 2019-04-13 DIAGNOSIS — H903 Sensorineural hearing loss, bilateral: Secondary | ICD-10-CM | POA: Diagnosis not present

## 2019-04-13 DIAGNOSIS — G250 Essential tremor: Secondary | ICD-10-CM | POA: Insufficient documentation

## 2019-04-13 DIAGNOSIS — R251 Tremor, unspecified: Secondary | ICD-10-CM | POA: Diagnosis not present

## 2019-04-22 DIAGNOSIS — E538 Deficiency of other specified B group vitamins: Secondary | ICD-10-CM | POA: Diagnosis not present

## 2019-04-22 DIAGNOSIS — E785 Hyperlipidemia, unspecified: Secondary | ICD-10-CM | POA: Diagnosis not present

## 2019-04-22 DIAGNOSIS — I10 Essential (primary) hypertension: Secondary | ICD-10-CM | POA: Diagnosis not present

## 2019-04-22 DIAGNOSIS — E890 Postprocedural hypothyroidism: Secondary | ICD-10-CM | POA: Diagnosis not present

## 2019-04-22 DIAGNOSIS — R42 Dizziness and giddiness: Secondary | ICD-10-CM | POA: Diagnosis not present

## 2019-05-04 DIAGNOSIS — H20022 Recurrent acute iridocyclitis, left eye: Secondary | ICD-10-CM | POA: Diagnosis not present

## 2019-05-11 DIAGNOSIS — H5213 Myopia, bilateral: Secondary | ICD-10-CM | POA: Diagnosis not present

## 2019-05-12 DIAGNOSIS — E538 Deficiency of other specified B group vitamins: Secondary | ICD-10-CM | POA: Diagnosis not present

## 2019-05-24 DIAGNOSIS — I10 Essential (primary) hypertension: Secondary | ICD-10-CM | POA: Diagnosis not present

## 2019-05-24 DIAGNOSIS — F419 Anxiety disorder, unspecified: Secondary | ICD-10-CM | POA: Diagnosis not present

## 2019-05-24 DIAGNOSIS — R81 Glycosuria: Secondary | ICD-10-CM | POA: Diagnosis not present

## 2019-05-24 DIAGNOSIS — R300 Dysuria: Secondary | ICD-10-CM | POA: Diagnosis not present

## 2019-05-26 DIAGNOSIS — E538 Deficiency of other specified B group vitamins: Secondary | ICD-10-CM | POA: Diagnosis not present

## 2019-05-26 DIAGNOSIS — S46812A Strain of other muscles, fascia and tendons at shoulder and upper arm level, left arm, initial encounter: Secondary | ICD-10-CM | POA: Diagnosis not present

## 2019-05-29 ENCOUNTER — Other Ambulatory Visit: Payer: Self-pay

## 2019-05-29 ENCOUNTER — Emergency Department (HOSPITAL_COMMUNITY)
Admission: EM | Admit: 2019-05-29 | Discharge: 2019-05-29 | Disposition: A | Discharge status: Home / Self Care | Payer: Medicare HMO | Attending: Emergency Medicine | Admitting: Emergency Medicine

## 2019-05-29 DIAGNOSIS — F419 Anxiety disorder, unspecified: Secondary | ICD-10-CM | POA: Insufficient documentation

## 2019-05-29 DIAGNOSIS — I10 Essential (primary) hypertension: Secondary | ICD-10-CM | POA: Diagnosis not present

## 2019-05-29 DIAGNOSIS — R457 State of emotional shock and stress, unspecified: Secondary | ICD-10-CM | POA: Diagnosis not present

## 2019-05-29 DIAGNOSIS — R42 Dizziness and giddiness: Secondary | ICD-10-CM | POA: Diagnosis not present

## 2019-05-29 DIAGNOSIS — R000 Tachycardia, unspecified: Secondary | ICD-10-CM | POA: Diagnosis not present

## 2019-05-29 NOTE — ED Provider Notes (Signed)
Emergency Department Provider Note   I have reviewed the triage vital signs and the nursing notes.   HISTORY  Chief Complaint Anxiety and Hypertension   HPI [name] is a 74 y.o. female with history of hypertension woke up tonight with hypertension.  Patient states that she went to bed her blood pressure was in the 150s which is little high for her but that she woke up at 3:00 and checked her blood pressure again it was elevated still.  This made her worried so she went to the fire station where they rechecked it and it was still in the 140s so she was brought here by ambulance for stated 140s.  On my evaluation is in the 120s and she is asymptomatic.   No other associated or modifying symptoms.    Past Medical History:  Diagnosis Date  . Anxiety   . Arthritis   . Depression   . Hyperlipemia   . Hypertension   . Thyroid disease   . TIA (transient ischemic attack)     Patient Active Problem List   Diagnosis Date Noted  . Bilateral primary osteoarthritis of knee 09/23/2017  . Numbness and tingling in left arm 05/24/2012  . Numbness of face 05/24/2012  . Unspecified hypothyroidism 05/24/2012  . Alcohol abuse, daily use 05/24/2012  . Hypertension   . Hyperlipemia   . Depression   . Anxiety     Past Surgical History:  Procedure Laterality Date  . ABDOMINAL HYSTERECTOMY    . BREAST BIOPSY    . TUBAL LIGATION      Current Outpatient Rx  . Order #: [id number] Class: Historical Med  . Order #: [id number] Class: Historical Med  . Order #: [id number] Class: Historical Med  . Order #: [id number] Class: Historical Med  . Order #: [id number] Class: Historical Med  . Order #: [id number] Class: Historical Med  . Order #: [id number] Class: Historical Med    Allergies Penicillins  Family History  Problem Relation Age of Onset  . Alcohol abuse Brother   . Heart disease Brother   . Diabetes Mother   . Alzheimer's disease Mother   . Dementia Mother   . COPD Father   .  Emphysema Father   . Heart attack Daughter   . Heart disease Sister   . Emphysema Brother     Social History Social History   Tobacco Use  . Smoking status: Former Smoker    Quit date: 05/25/1987    Years since quitting: 32.0  . Smokeless tobacco: Never Used  Substance Use Topics  . Alcohol use: Yes    Comment: 3 glasses of wine daily  . Drug use: No    Review of Systems  All other systems negative except as documented in the HPI. All pertinent positives and negatives as reviewed in the HPI. ____________________________________________   PHYSICAL EXAM:  VITAL SIGNS: ED Triage Vitals  Enc Vitals Group     BP 05/29/19 0520 127/79     Pulse Rate 05/29/19 0520 86     Resp 05/29/19 0520 13     Temp 05/29/19 0520 98.7 F (37.1 C)     Temp Source 05/29/19 0520 Oral     SpO2 05/29/19 0520 98 %     Weight 05/29/19 0520 185 lb (83.9 kg)     Height 05/29/19 0520 5\' 4"  (1.626 m)     Head Circumference --      Peak Flow --      Pain Score 05/29/19 0515 0  Pain Loc --      Pain Edu? --      Excl. in [place]? --     Constitutional: Alert and oriented. Well appearing and in no acute distress. Eyes: Conjunctivae are normal. PERRL. EOMI. Head: Atraumatic. Nose: No congestion/rhinnorhea. Mouth/Throat: Mucous membranes are moist.  Oropharynx non-erythematous. Neck: No stridor.  No meningeal signs.   Cardiovascular: Normal rate, regular rhythm. Good peripheral circulation. Grossly normal heart sounds.   Respiratory: Normal respiratory effort.  No retractions. Lungs CTAB. Gastrointestinal: Soft and nontender. No distention.  Musculoskeletal: No lower extremity tenderness nor edema. No gross deformities of extremities. Neurologic:  Normal speech and language. No gross focal neurologic deficits are appreciated.  Skin:  Skin is warm, dry and intact. No rash noted.   ____________________________________________   INITIAL IMPRESSION / ASSESSMENT AND PLAN / ED COURSE  Without any  evidence of endorgan damage there is no indication for work-up.  Her blood pressures here are within normal range x3 in a row.  Unclear why it was high earlier but can follow-up with primary doctor.     Pertinent labs & imaging results that were available during my care of the patient were reviewed by me and considered in my medical decision making (see chart for details).  A medical screening exam was performed and I feel the patient has had an appropriate workup for their chief complaint at this time and likelihood of emergent condition existing is low. They have been counseled on decision, discharge, follow up and which symptoms necessitate immediate return to the emergency department. They or their family verbally stated understanding and agreement with plan and discharged in stable condition.   ____________________________________________  FINAL CLINICAL IMPRESSION(S) / ED DIAGNOSES  Final diagnoses:  Hypertension, unspecified type     MEDICATIONS GIVEN DURING THIS VISIT:  Medications - No data to display   NEW OUTPATIENT MEDICATIONS STARTED DURING THIS VISIT:  Discharge Medication List as of 05/29/2019  5:57 AM      Note:  This note was prepared with assistance of Dragon voice recognition software. Occasional wrong-word or sound-a-like substitutions may have occurred due to the inherent limitations of voice recognition software.   Cristle Jared, Corene Cornea, MD 05/29/19 [phone]

## 2019-05-29 NOTE — ED Triage Notes (Signed)
Pt to ED by [facility] with c/o of high blood pressure. Pt states she got up in the middle of the night to go to the bathroom and felt off and took her BP with reading of 160/90. Pt decided to drive to Fire station because once saw the high reading started to get really worried/anxious. Pt had a recent BP medication change a month ago. Pt has hx of anxiety, diabetes, and HTN, with tremors at baseline. Pt is A&O x4.

## 2019-06-10 DIAGNOSIS — E119 Type 2 diabetes mellitus without complications: Secondary | ICD-10-CM | POA: Diagnosis not present

## 2019-06-10 DIAGNOSIS — E538 Deficiency of other specified B group vitamins: Secondary | ICD-10-CM | POA: Diagnosis not present

## 2019-06-10 DIAGNOSIS — Z7984 Long term (current) use of oral hypoglycemic drugs: Secondary | ICD-10-CM | POA: Diagnosis not present

## 2019-06-24 DIAGNOSIS — E538 Deficiency of other specified B group vitamins: Secondary | ICD-10-CM | POA: Diagnosis not present

## 2019-07-07 DIAGNOSIS — E538 Deficiency of other specified B group vitamins: Secondary | ICD-10-CM | POA: Diagnosis not present

## 2019-07-12 DIAGNOSIS — F419 Anxiety disorder, unspecified: Secondary | ICD-10-CM | POA: Diagnosis not present

## 2019-07-12 DIAGNOSIS — E119 Type 2 diabetes mellitus without complications: Secondary | ICD-10-CM | POA: Diagnosis not present

## 2019-07-12 DIAGNOSIS — F1020 Alcohol dependence, uncomplicated: Secondary | ICD-10-CM | POA: Diagnosis not present

## 2019-07-12 DIAGNOSIS — F39 Unspecified mood [affective] disorder: Secondary | ICD-10-CM | POA: Diagnosis not present

## 2019-07-14 DIAGNOSIS — F1020 Alcohol dependence, uncomplicated: Secondary | ICD-10-CM | POA: Diagnosis not present

## 2019-07-14 DIAGNOSIS — F321 Major depressive disorder, single episode, moderate: Secondary | ICD-10-CM | POA: Diagnosis not present

## 2019-07-14 DIAGNOSIS — E119 Type 2 diabetes mellitus without complications: Secondary | ICD-10-CM | POA: Diagnosis not present

## 2019-07-14 DIAGNOSIS — F39 Unspecified mood [affective] disorder: Secondary | ICD-10-CM | POA: Diagnosis not present

## 2019-07-14 DIAGNOSIS — F419 Anxiety disorder, unspecified: Secondary | ICD-10-CM | POA: Diagnosis not present

## 2019-07-14 DIAGNOSIS — F331 Major depressive disorder, recurrent, moderate: Secondary | ICD-10-CM | POA: Diagnosis not present

## 2019-07-21 DIAGNOSIS — E538 Deficiency of other specified B group vitamins: Secondary | ICD-10-CM | POA: Diagnosis not present

## 2019-08-04 DIAGNOSIS — E538 Deficiency of other specified B group vitamins: Secondary | ICD-10-CM | POA: Diagnosis not present

## 2019-08-18 DIAGNOSIS — E538 Deficiency of other specified B group vitamins: Secondary | ICD-10-CM | POA: Diagnosis not present

## 2019-09-03 DIAGNOSIS — E538 Deficiency of other specified B group vitamins: Secondary | ICD-10-CM | POA: Diagnosis not present

## 2019-09-15 DIAGNOSIS — E538 Deficiency of other specified B group vitamins: Secondary | ICD-10-CM | POA: Diagnosis not present

## 2019-09-29 DIAGNOSIS — Z794 Long term (current) use of insulin: Secondary | ICD-10-CM | POA: Diagnosis not present

## 2019-09-29 DIAGNOSIS — E119 Type 2 diabetes mellitus without complications: Secondary | ICD-10-CM | POA: Diagnosis not present

## 2019-10-13 DIAGNOSIS — E538 Deficiency of other specified B group vitamins: Secondary | ICD-10-CM | POA: Diagnosis not present

## 2019-10-13 DIAGNOSIS — E119 Type 2 diabetes mellitus without complications: Secondary | ICD-10-CM | POA: Diagnosis not present

## 2019-10-13 DIAGNOSIS — Z794 Long term (current) use of insulin: Secondary | ICD-10-CM | POA: Diagnosis not present

## 2019-10-19 DIAGNOSIS — E039 Hypothyroidism, unspecified: Secondary | ICD-10-CM | POA: Diagnosis not present

## 2019-10-19 DIAGNOSIS — F419 Anxiety disorder, unspecified: Secondary | ICD-10-CM | POA: Diagnosis not present

## 2019-10-19 DIAGNOSIS — Z7984 Long term (current) use of oral hypoglycemic drugs: Secondary | ICD-10-CM | POA: Diagnosis not present

## 2019-10-19 DIAGNOSIS — E538 Deficiency of other specified B group vitamins: Secondary | ICD-10-CM | POA: Diagnosis not present

## 2019-10-19 DIAGNOSIS — E785 Hyperlipidemia, unspecified: Secondary | ICD-10-CM | POA: Diagnosis not present

## 2019-10-19 DIAGNOSIS — E119 Type 2 diabetes mellitus without complications: Secondary | ICD-10-CM | POA: Diagnosis not present

## 2019-10-20 ENCOUNTER — Telehealth: Payer: Self-pay | Admitting: Family Medicine

## 2019-10-20 ENCOUNTER — Encounter: Payer: Self-pay | Admitting: Family Medicine

## 2019-10-20 ENCOUNTER — Ambulatory Visit (INDEPENDENT_AMBULATORY_CARE_PROVIDER_SITE_OTHER): Payer: Medicare HMO | Admitting: Family Medicine

## 2019-10-20 ENCOUNTER — Other Ambulatory Visit: Payer: Self-pay

## 2019-10-20 VITALS — BP 122/82 | HR 68 | Temp 97.6°F | Resp 16 | Ht 66.0 in | Wt 179.0 lb

## 2019-10-20 DIAGNOSIS — E039 Hypothyroidism, unspecified: Secondary | ICD-10-CM

## 2019-10-20 DIAGNOSIS — F1010 Alcohol abuse, uncomplicated: Secondary | ICD-10-CM

## 2019-10-20 DIAGNOSIS — F411 Generalized anxiety disorder: Secondary | ICD-10-CM | POA: Insufficient documentation

## 2019-10-20 DIAGNOSIS — E782 Mixed hyperlipidemia: Secondary | ICD-10-CM | POA: Diagnosis not present

## 2019-10-20 DIAGNOSIS — M159 Polyosteoarthritis, unspecified: Secondary | ICD-10-CM

## 2019-10-20 DIAGNOSIS — F339 Major depressive disorder, recurrent, unspecified: Secondary | ICD-10-CM | POA: Diagnosis not present

## 2019-10-20 DIAGNOSIS — E119 Type 2 diabetes mellitus without complications: Secondary | ICD-10-CM | POA: Diagnosis not present

## 2019-10-20 DIAGNOSIS — M8949 Other hypertrophic osteoarthropathy, multiple sites: Secondary | ICD-10-CM | POA: Diagnosis not present

## 2019-10-20 DIAGNOSIS — G250 Essential tremor: Secondary | ICD-10-CM | POA: Diagnosis not present

## 2019-10-20 DIAGNOSIS — I10 Essential (primary) hypertension: Secondary | ICD-10-CM | POA: Diagnosis not present

## 2019-10-20 DIAGNOSIS — E538 Deficiency of other specified B group vitamins: Secondary | ICD-10-CM | POA: Insufficient documentation

## 2019-10-20 HISTORY — DX: Deficiency of other specified B group vitamins: E53.8

## 2019-10-20 MED ORDER — ESCITALOPRAM OXALATE 10 MG PO TABS: 10.0000 mg | ORAL_TABLET | Freq: Every day | ORAL | 1 refills | Status: DC

## 2019-10-20 NOTE — Telephone Encounter (Signed)
Pt called stating she has been getting B12 shots every other week for a while with her previous doctor. Pt asked if she could continue getting those here at our office with Dr. Jonni Sanger. Please advise .

## 2019-10-20 NOTE — Telephone Encounter (Signed)
OK to continue biweekly B12 injections? Please advise

## 2019-10-20 NOTE — Patient Instructions (Signed)
Please return in 3 months to recheck your mood and diabetes.  Please sign a release of information so I can get your records from [place].   Please restart your lexapro.   It was a pleasure meeting you today! Thank you for choosing [place] to meet your healthcare needs! I truly look forward to working with you. If you have any questions or concerns, please send me a message via Mychart or call the office at [phone].

## 2019-10-20 NOTE — Progress Notes (Signed)
Subjective  CC:  Chief Complaint  Patient presents with  . New Patient (Initial Visit)    [name] with [facility], had labs done yesterday  . Numbness    left side, SLAP tear 5-6 years ago    HPI: [name] is a 74 y.o. female who presents to [facility] [place] today to establish care with me as a new patient.   She has the following concerns or needs:  74 year old female, married with 1 grown daughter who is from [facility].  Formally was at [facility] with Dr. Freddrick March for 30 to 40 years.  Since he is retired she has had different providers thus is looking to change.  I reviewed her available medical data.  I do need records from her prior PCP office.  Currently not available.  She is a good historian  Health maintenance: Patient reports mammogram is scheduled and has been regular and normal.  She reports a recent bone density but I do not have the results.  Colonoscopy was last done in 2015 and was normal.  No further screening recommended.  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  She has no complications from his chronic diseases and she reports the control is good.  I reviewed her list of medications.  She does not need refills.  She also is hypothyroid and says that her levels have been stable for years.  She reports she got labs done at the office yesterday.  I will call for those reports.  She does drink wine 2 to 3 glasses nightly and has done those for decades.  She reports this helped with her anxiety.  She suffers from chronic anxiety disorder and depression.  She was treated with Prozac in the past.  Recently she started Lexapro but did not keep on it.  Her daughter uses Lexapro and it helps.  She would be open to restarting it.  Depression screen PHQ 2/9 10/20/2019  Decreased Interest 2  Down, Depressed, Hopeless 3   PHQ - 2 Score 5  Altered sleeping 1  Tired, decreased energy 3  Change in appetite 0  Feeling bad or failure about yourself  0  Trouble concentrating 2  Moving slowly or fidgety/restless 1  Suicidal thoughts 0  PHQ-9 Score 12  Difficult doing work/chores Very difficult   GAD 7 : Generalized Anxiety Score 10/20/2019  Nervous, Anxious, on Edge 3  Control/stop worrying 3  Worry too much - different things 3  Trouble relaxing 3  Restless 2  Easily annoyed or irritable 3  Afraid - awful might happen 1  Total GAD 7 Score 18  Anxiety Difficulty Very difficult      Assessment  1. Essential hypertension   2. Mixed hyperlipidemia   3. Acquired hypothyroidism   4. GAD (generalized anxiety disorder)   5. Major depression, recurrent, chronic ([place])   6. Diet-controlled diabetes mellitus ([place])   7. Primary osteoarthritis involving multiple joints   8. Essential tremor   9. Alcohol abuse, daily use      Plan   Hypertension is well controlled.  She is on amlodipine but not on an ACE inhibitor.  We will need to review old records to see if there is a reason.  Will need urine microalbuminuria testing.  History of hyperlipidemia but no longer requiring medications.  Given diagnosis of diabetes, we may reconsider.  Diet-controlled diabetes: Eye exam reportedly up-to-date.  Will call for records.  We will get recent lab work.  She reports she has had pneumonia vaccinations but will need records.  Reports thyroid is well controlled.  Discussed anxiety depression and alcohol use: Warned against risk of chronic alcohol use.  Restart Lexapro and recheck in 3 months.  Essential tremor: Consider beta-blocker or other medications  Health maintenance: We will get records to confirm immunizations, mammogram bone density etc.  Follow up: 3 months to recheck diabetes and blood pressure and mood No orders of the defined types were placed in this encounter.  Meds ordered this encounter   Medications  . escitalopram (LEXAPRO) 10 MG tablet    Sig: Take 1 tablet (10 mg total) by mouth daily.    Dispense:  90 tablet    Refill:  1     Depression screen PHQ 2/9 10/20/2019  Decreased Interest 2  Down, Depressed, Hopeless 3  PHQ - 2 Score 5  Altered sleeping 1  Tired, decreased energy 3  Change in appetite 0  Feeling bad or failure about yourself  0  Trouble concentrating 2  Moving slowly or fidgety/restless 1  Suicidal thoughts 0  PHQ-9 Score 12  Difficult doing work/chores Very difficult    We updated and reviewed the patient's past history in detail and it is documented below.  Patient Active Problem List   Diagnosis Date Noted  . GAD (generalized anxiety disorder) 10/20/2019    Priority: High  . Major depression, recurrent, chronic ([place]) 10/20/2019    Priority: High  . Diet-controlled diabetes mellitus ([place]) 10/20/2019    Priority: High  . Essential tremor 04/13/2019    Priority: High  . Acquired hypothyroidism 05/24/2012    Priority: High  . Alcohol abuse, daily use 05/24/2012    Priority: High  . Essential hypertension     Priority: High  . Mixed hyperlipidemia     Priority: High  . Osteoarthritis, multiple sites 10/20/2019    Priority: Medium    Neck, back, hands, shoulders, knees.  He has seen [facility] in the past   . Bilateral primary osteoarthritis of knee 09/23/2017    Priority: Medium  . Vitamin B12 deficiency 10/20/2019    Priority: Low  . Presbycusis of both ears 04/13/2019    Priority: Low   Health Maintenance  Topic Date Due  . Hepatitis C Screening  Never done  . FOOT EXAM  Never done  . URINE MICROALBUMIN  Never done  . COVID-19 Vaccine (1) Never done  . TETANUS/TDAP  Never done  . DEXA SCAN  Never done  . PNA vac Low Risk Adult (1 of 2 - PCV13) Never done  . INFLUENZA VACCINE  10/17/2019  . HEMOGLOBIN A1C  04/20/2020  . MAMMOGRAM  08/23/2020  . OPHTHALMOLOGY EXAM  08/30/2020   Immunization History  Administered Date(s)  Administered  . Influenza, High Dose Seasonal PF 12/13/2013, 10/30/2018  . Influenza-Unspecified 12/16/2013  . Zoster Recombinat (Shingrix) 07/27/2017   Current Meds  Medication Sig  . ALPRAZolam (XANAX) 0.5 MG tablet Take 1 tablet by mouth daily as needed for anxiety.  Marland Kitchen amLODipine (NORVASC) 5 MG tablet Take 5 mg by mouth daily.  Marland Kitchen aspirin EC 81 MG tablet Take 81 mg by mouth daily.  . cyanocobalamin (,VITAMIN B-12,) 1000 MCG/ML injection Inject 1,000 mcg into the muscle every 14 (fourteen) days.   Marland Kitchen levothyroxine (SYNTHROID) 125 MCG tablet Take 125 mcg by mouth daily.  . [DISCONTINUED] amLODipine (NORVASC) 10  MG tablet Take 5 mg by mouth at bedtime.   . [DISCONTINUED] amLODipine (NORVASC) 10 MG tablet Take 1 tablet by mouth daily.  . [DISCONTINUED] cyanocobalamin (,VITAMIN B-12,) 1000 MCG/ML injection   . [DISCONTINUED] levothyroxine (SYNTHROID) 125 MCG tablet TAKE 1 TABLET BY MOUTH EVERY DAY ON EMPTY STOMACH IN THE MORNING    Allergies: Patient is allergic to penicillins. Past Medical History Patient  has a past medical history of Anxiety, Arthritis, Depression, Hyperlipemia, Hypertension, Thyroid disease, TIA (transient ischemic attack), and Vitamin B12 deficiency (10/20/2019). Past Surgical History Patient  has a past surgical history that includes Abdominal hysterectomy; Tubal ligation; and Breast biopsy. Family History: Patient family history includes Alcohol abuse in her brother; Alzheimer's disease in her mother; COPD in her father; Dementia in her mother; Diabetes in her mother; Emphysema in her brother and father; Heart attack in her daughter; Heart disease in her brother and sister. Social History:  Patient  reports that she quit smoking about 32 years ago. Her smoking use included cigarettes. She has never used smokeless tobacco. She reports current alcohol use of about 21.0 standard drinks of alcohol per week. She reports that she does not use drugs.  Review of  Systems: Constitutional: negative for fever or malaise Ophthalmic: negative for photophobia, double vision or loss of vision Cardiovascular: negative for chest pain, dyspnea on exertion, or new LE swelling Respiratory: negative for SOB or persistent cough Gastrointestinal: negative for abdominal pain, change in bowel habits or melena Genitourinary: negative for dysuria or gross hematuria Musculoskeletal: negative for new gait disturbance or muscular weakness Integumentary: negative for new or persistent rashes Neurological: negative for TIA or stroke symptoms Psychiatric: negative for SI or delusions Allergic/Immunologic: negative for hives  Patient Care Team    Relationship Specialty Notifications Start End  [name], MD PCP - General Family Medicine  10/20/19   Izora Gala, MD Consulting Physician Otolaryngology  10/20/19     Objective  Vitals: BP 122/82   Pulse 68   Temp 97.6 F (36.4 C) (Temporal)   Resp 16   Ht 5\' 6"  (1.676 m)   Wt 179 lb (81.2 kg)   SpO2 95%   BMI 28.89 kg/m  General:  Well developed, well nourished, no acute distress  Psych:  Alert and oriented,normal mood and affect HEENT:  Normocephalic, atraumatic, non-icteric sclera, supple neck without adenopathy, mass or thyromegaly Cardiovascular:  RRR without gallop, rub or murmur Respiratory:  Good breath sounds bilaterally, CTAB with normal respiratory effort MSK: Prominent OA changes in bilateral hands without erythema or swelling  skin:  Warm, no rashes or suspicious lesions noted Neurologic:    Mental status is normal. Gross motor and sensory exams are normal. Normal gait   Commons side effects, risks, benefits, and alternatives for medications and treatment plan prescribed today were discussed, and the patient expressed understanding of the given instructions. Patient is instructed to call or message via MyChart if he/she has any questions or concerns regarding our treatment plan. No barriers to  understanding were identified. We discussed Red Flag symptoms and signs in detail. Patient expressed understanding regarding what to do in case of urgent or emergency type symptoms.   Medication list was reconciled, printed and provided to the patient in AVS. Patient instructions and summary information was reviewed with the patient as documented in the AVS. This note was prepared with assistance of Dragon voice recognition software. Occasional wrong-word or sound-a-like substitutions may have occurred due to the inherent limitations of voice recognition software  This visit occurred during the SARS-CoV-2 public health emergency.  Safety protocols were in place, including screening questions prior to the visit, additional usage of staff PPE, and extensive cleaning of exam room while observing appropriate contact time as indicated for disinfecting solutions.

## 2019-10-26 NOTE — Telephone Encounter (Signed)
I need medical records with B12 labs from [place]. Right now, can do monthly injections. Will need f/u lab work with me.

## 2019-10-26 NOTE — Telephone Encounter (Signed)
Informed patient that Dr. Jonni Sanger recommends once monthly B12 injections. Patient became quite upset, stating that she does not feel comfortable changing from biweekly to monthly injections. Stated that if we planned on changing too many things with her current care, she would just stay at the facility she is with. Patient states that she is getting a B12 injection tomorrow at [place] in [place].

## 2019-10-26 NOTE — Telephone Encounter (Signed)
I will schedule monthly B12 injection. OK to check labs at next next visit in November?

## 2019-10-27 DIAGNOSIS — E538 Deficiency of other specified B group vitamins: Secondary | ICD-10-CM | POA: Diagnosis not present

## 2019-10-27 NOTE — Telephone Encounter (Signed)
Her choice of course. I need medical records and we can office visit to discuss. She cannot go to both of [place].  Thanks.

## 2019-11-02 DIAGNOSIS — Z1231 Encounter for screening mammogram for malignant neoplasm of breast: Secondary | ICD-10-CM | POA: Diagnosis not present

## 2019-11-04 NOTE — Telephone Encounter (Signed)
Scheduled patient for office visit on Monday to discuss b12 being biweekly not monthly. Patient stated she is going to call [facility] to get medical records sent to [place] regarding this.

## 2019-11-08 ENCOUNTER — Encounter: Payer: Self-pay | Admitting: Family Medicine

## 2019-11-08 ENCOUNTER — Other Ambulatory Visit: Payer: Self-pay

## 2019-11-08 ENCOUNTER — Ambulatory Visit (INDEPENDENT_AMBULATORY_CARE_PROVIDER_SITE_OTHER): Payer: Medicare HMO | Admitting: Family Medicine

## 2019-11-08 VITALS — BP 140/82 | HR 83 | Temp 96.3°F | Wt 174.6 lb

## 2019-11-08 DIAGNOSIS — G250 Essential tremor: Secondary | ICD-10-CM | POA: Diagnosis not present

## 2019-11-08 DIAGNOSIS — E039 Hypothyroidism, unspecified: Secondary | ICD-10-CM | POA: Diagnosis not present

## 2019-11-08 DIAGNOSIS — E782 Mixed hyperlipidemia: Secondary | ICD-10-CM

## 2019-11-08 DIAGNOSIS — E119 Type 2 diabetes mellitus without complications: Secondary | ICD-10-CM | POA: Diagnosis not present

## 2019-11-08 DIAGNOSIS — E538 Deficiency of other specified B group vitamins: Secondary | ICD-10-CM | POA: Diagnosis not present

## 2019-11-08 DIAGNOSIS — I10 Essential (primary) hypertension: Secondary | ICD-10-CM | POA: Diagnosis not present

## 2019-11-08 NOTE — Progress Notes (Signed)
Subjective  CC:  Chief Complaint  Patient presents with  . B12 deficiency    requesting B12 injections biweekly instead of monthly    HPI: [name] is a 74 y.o. female who presents to the office today to address the problems listed above in the chief complaint.  Patient returns to discuss vitamin B12 deficiency.  She reports she has been on twice monthly B12 injections for years.  It looks that she has been tested for pernicious anemia and it was negative.  She believes that they tried monthly injections but her levels never got to where they should be.  She does have vitamin B12 at home and she brings that insulin nurses can give injections.  She is due Wednesday for her next shot.  I do not have any recent lab work although it has been requested multiple times.  Labs from 2019 showed normal levels.  Diabetes with mild peripheral neuropathy: No foot sores.  Diet controlled.  Due for urine microalbuminuria check.  She is not on an ACE inhibitor for unclear reasons  Hyperlipidemia: On a statin.  Tolerates well  Hypertension: Mildly elevated today.  She takes amlodipine daily.  Hypothyroidism: She reports this is well controlled.  BP Readings from Last 3 Encounters:  11/08/19 140/82  10/20/19 122/82  05/29/19 125/78    Assessment  1. Vitamin B12 deficiency   2. Diet-controlled diabetes mellitus ([place])   3. Essential tremor   4. Essential hypertension   5. Mixed hyperlipidemia   6. Acquired hypothyroidism      Plan   Vitamin B12 deficiency: Recheck levels today and continue twice monthly injections here at the office.  Will monitor every 3 months to ensure stability and correct dosing.  Patient cannot self administer her injections due to her essential tremor  Diet-controlled diabetes with mild peripheral neuropathy: Check urine microalbuminuria today.  Start ACE inhibitor if indicated.  Check renal function and electrolytes.  Continue diabetic diet  Hypertension:  Borderline control today but was normal on previous readings.  Will consider medication adjustment after urine testing is back and at her next visit depending on her readings.  Hyperlipidemia: We will check levels today for my records.  Check liver function tests  Hypothyroidism: Recheck TSH to ensure correct dosing.  Follow up: Nurse visit in 2 days for vitamin B12 injection, then as scheduled 01/20/2020  Orders Placed This Encounter  Procedures  . Microalbumin / creatinine urine ratio  . Vitamin B12  . Comprehensive metabolic panel  . Lipid panel  . TSH   No orders of the defined types were placed in this encounter.     I reviewed the patients updated PMH, FH, and SocHx.    Patient Active Problem List   Diagnosis Date Noted  . GAD (generalized anxiety disorder) 10/20/2019    Priority: High  . Major depression, recurrent, chronic ([place]) 10/20/2019    Priority: High  . Diet-controlled diabetes mellitus ([place]) 10/20/2019    Priority: High  . Essential tremor 04/13/2019    Priority: High  . Acquired hypothyroidism 05/24/2012    Priority: High  . Alcohol abuse, daily use 05/24/2012    Priority: High  . Essential hypertension     Priority: High  . Mixed hyperlipidemia     Priority: High  . Osteoarthritis, multiple sites 10/20/2019    Priority: Medium  . Bilateral primary osteoarthritis of knee 09/23/2017    Priority: Medium  . Vitamin B12 deficiency 10/20/2019    Priority: Low  .  Presbycusis of both ears 04/13/2019    Priority: Low   Current Meds  Medication Sig  . ACCU-CHEK AVIVA PLUS test strip   . ALPRAZolam (XANAX) 0.5 MG tablet Take 1 tablet by mouth daily as needed for anxiety.  . amLODipine (NORVASC) 5 MG tablet Take 5 mg by mouth daily.  . aspirin EC 81 MG tablet Take 81 mg by mouth daily.  . Blood Glucose Monitoring Suppl (ACCU-CHEK GUIDE) w/Device KIT   . cyanocobalamin (,VITAMIN B-12,) 1000 MCG/ML injection Inject 1,000 mcg into the muscle every 14  (fourteen) days.   . escitalopram (LEXAPRO) 10 MG tablet Take 1 tablet (10 mg total) by mouth daily.  . levothyroxine (SYNTHROID) 125 MCG tablet Take 125 mcg by mouth daily.  . prednisoLONE acetate (PRED FORTE) 1 % ophthalmic suspension Place 1 drop into the left eye 4 (four) times daily.    Allergies: Patient is allergic to penicillins. Family History: Patient family history includes Alcohol abuse in her brother; Alzheimer's disease in her mother; COPD in her father; Dementia in her mother; Diabetes in her mother; Emphysema in her brother and father; Heart attack in her daughter; Heart disease in her brother and sister. Social History:  Patient  reports that she quit smoking about 32 years ago. Her smoking use included cigarettes. She has never used smokeless tobacco. She reports current alcohol use of about 21.0 standard drinks of alcohol per week. She reports that she does not use drugs.  Review of Systems: Constitutional: Negative for fever malaise or anorexia Cardiovascular: negative for chest pain Respiratory: negative for SOB or persistent cough Gastrointestinal: negative for abdominal pain  Objective  Vitals: BP 140/82   Pulse 83   Temp (!) 96.3 F (35.7 C) (Temporal)   Wt 174 lb 9.6 oz (79.2 kg)   SpO2 98%   BMI 28.18 kg/m  General: no acute distress , A&Ox3 HEENT: [place]ERL, conjunctiva normal, neck is supple Cardiovascular:  RRR without murmur or gallop.  Respiratory:  Good breath sounds bilaterally, CTAB with normal respiratory effort Skin:  Warm, no rashes     Commons side effects, risks, benefits, and alternatives for medications and treatment plan prescribed today were discussed, and the patient expressed understanding of the given instructions. Patient is instructed to call or message via MyChart if he/she has any questions or concerns regarding our treatment plan. No barriers to understanding were identified. We discussed Red Flag symptoms and signs in detail.  Patient expressed understanding regarding what to do in case of urgent or emergency type symptoms.   Medication list was reconciled, printed and provided to the patient in AVS. Patient instructions and summary information was reviewed with the patient as documented in the AVS. This note was prepared with assistance of Dragon voice recognition software. Occasional wrong-word or sound-a-like substitutions may have occurred due to the inherent limitations of voice recognition software  This visit occurred during the SARS-CoV-2 public health emergency.  Safety protocols were in place, including screening questions prior to the visit, additional usage of staff PPE, and extensive cleaning of exam room while observing appropriate contact time as indicated for disinfecting solutions.   

## 2019-11-08 NOTE — Patient Instructions (Signed)
Please schedule a nurse visit for your vitamin B12 injections for this wedenesday and then twice a month.  I'll check your labs and follow it for you.   I will see you again on 01/20/2020 for a recheck.   If you have any questions or concerns, please don't hesitate to send me a message via MyChart or call the office at [phone]. Thank you for visiting with [place] today! It's our pleasure caring for you.

## 2019-11-09 DIAGNOSIS — R928 Other abnormal and inconclusive findings on diagnostic imaging of breast: Secondary | ICD-10-CM | POA: Diagnosis not present

## 2019-11-09 LAB — MICROALBUMIN / CREATININE URINE RATIO
Creatinine, Urine: 146 mg/dL (ref 20–275)
Microalb Creat Ratio: 3 mcg/mg creat (ref <30)
Microalb, Ur: 0.5 mg/dL

## 2019-11-09 LAB — COMPREHENSIVE METABOLIC PANEL
AG Ratio: 1.8 (calc) (ref 1.0–2.5)
ALT: 28 U/L (ref 6–29)
AST: 24 U/L (ref 10–35)
Albumin: 4.5 g/dL (ref 3.6–5.1)
Alkaline phosphatase (APISO): 72 U/L (ref 37–153)
BUN: 13 mg/dL (ref 7–25)
CO2: 27 mmol/L (ref 20–32)
Calcium: 9.9 mg/dL (ref 8.6–10.4)
Chloride: 101 mmol/L (ref 98–110)
Creat: 0.84 mg/dL (ref 0.60–0.93)
Globulin: 2.5 g/dL (calc) (ref 1.9–3.7)
Glucose, Bld: 157 mg/dL — ABNORMAL HIGH (ref 65–99)
Potassium: 4.8 mmol/L (ref 3.5–5.3)
Sodium: 137 mmol/L (ref 135–146)
Total Bilirubin: 0.7 mg/dL (ref 0.2–1.2)
Total Protein: 7 g/dL (ref 6.1–8.1)

## 2019-11-09 LAB — VITAMIN B12: Vitamin B-12: 780 pg/mL (ref 200–1100)

## 2019-11-09 LAB — LIPID PANEL
Cholesterol: 224 mg/dL — ABNORMAL HIGH (ref <200)
HDL: 65 mg/dL (ref >=50)
LDL Cholesterol (Calc): 137 mg/dL (calc) — ABNORMAL HIGH
Non-HDL Cholesterol (Calc): 159 mg/dL (calc) — ABNORMAL HIGH (ref <130)
Total CHOL/HDL Ratio: 3.4 (calc) (ref <5.0)
Triglycerides: 110 mg/dL (ref <150)

## 2019-11-09 LAB — TSH: TSH: 0.79 mIU/L (ref 0.40–4.50)

## 2019-11-09 LAB — EXTRA LAV TOP TUBE

## 2019-11-10 ENCOUNTER — Ambulatory Visit (INDEPENDENT_AMBULATORY_CARE_PROVIDER_SITE_OTHER): Payer: Medicare HMO

## 2019-11-10 ENCOUNTER — Other Ambulatory Visit: Payer: Self-pay

## 2019-11-10 DIAGNOSIS — E538 Deficiency of other specified B group vitamins: Secondary | ICD-10-CM

## 2019-11-10 MED ORDER — CYANOCOBALAMIN 1000 MCG/ML IJ SOLN
1000.0000 ug | Freq: Once | INTRAMUSCULAR | Status: AC
  Administered 2019-11-10: 1000 ug via INTRAMUSCULAR

## 2019-11-10 NOTE — Progress Notes (Signed)
Patient came to the office today receive her vitamin b12 injection. She provided the b12, syringe and needle. Patient tolerated injection well. No questions or concerns at this time.

## 2019-11-11 ENCOUNTER — Encounter: Payer: Self-pay | Admitting: Family Medicine

## 2019-11-15 NOTE — Progress Notes (Signed)
Please call patient: I have reviewed his/her lab results. Vit b12 levels are good. Will continue bimonthly dosing and recheck levels in 3 months.  Urine test is fine. Needs a cholesterol lowering medication: lipitor 10 nightly if willing. Please order. Thanks.

## 2019-11-17 ENCOUNTER — Telehealth: Payer: Self-pay

## 2019-11-17 NOTE — Telephone Encounter (Signed)
Pt wants to know if we have any coupons for [employer]

## 2019-11-18 ENCOUNTER — Ambulatory Visit (INDEPENDENT_AMBULATORY_CARE_PROVIDER_SITE_OTHER): Payer: Medicare HMO

## 2019-11-18 DIAGNOSIS — Z0000 Encounter for general adult medical examination without abnormal findings: Secondary | ICD-10-CM | POA: Diagnosis not present

## 2019-11-18 NOTE — Patient Instructions (Addendum)
[name] , Thank you for taking time to come for your Medicare Wellness Visit. I appreciate your ongoing commitment to your health goals. Please review the following plan we discussed and let me know if I can assist you in the future.   Screening recommendations/referrals: Colonoscopy: Done 07/12/13 Mammogram: Done 08/24/18 Bone Density: Done 10/01/18 Recommended yearly ophthalmology/optometry visit for glaucoma screening and checkup Recommended yearly dental visit for hygiene and checkup  Vaccinations: Influenza vaccine: Up to date Pneumococcal vaccine: Up to date Tdap vaccine: Due and discussed  Shingles vaccine: Shingrix discussed. Please contact your pharmacy for coverage information.    Covid-19:Completed 1/12 & 04/30/19   Advanced directives: Advance directive discussed with you today. I have provided a copy for you to complete at home and have notarized. Once this is complete please bring a copy in to our office so we can scan it into your chart.   Conditions/risks identified: maintain a diet that keeps me off medications for Diabetes  Next appointment: Follow up in one year for your annual wellness visit    Preventive Care 65 Years and Older, Female Preventive care refers to lifestyle choices and visits with your health care provider that can promote health and wellness. What does preventive care include?  A yearly physical exam. This is also called an annual well check.  Dental exams once or twice a year.  Routine eye exams. Ask your health care provider how often you should have your eyes checked.  Personal lifestyle choices, including:  Daily care of your teeth and gums.  Regular physical activity.  Eating a healthy diet.  Avoiding tobacco and drug use.  Limiting alcohol use.  Practicing safe sex.  Taking low-dose aspirin every day.  Taking vitamin and mineral supplements as recommended by your health care provider. What happens during an annual well  check? The services and screenings done by your health care provider during your annual well check will depend on your age, overall health, lifestyle risk factors, and family history of disease. Counseling  Your health care provider may ask you questions about your:  Alcohol use.  Tobacco use.  Drug use.  Emotional well-being.  Home and relationship well-being.  Sexual activity.  Eating habits.  History of falls.  Memory and ability to understand (cognition).  Work and work Statistician.  Reproductive health. Screening  You may have the following tests or measurements:  Height, weight, and BMI.  Blood pressure.  Lipid and cholesterol levels. These may be checked every 5 years, or more frequently if you are over 3 years old.  Skin check.  Lung cancer screening. You may have this screening every year starting at age 32 if you have a 30-pack-year history of smoking and currently smoke or have quit within the past 15 years.  Fecal occult blood test (FOBT) of the stool. You may have this test every year starting at age 50.  Flexible sigmoidoscopy or colonoscopy. You may have a sigmoidoscopy every 5 years or a colonoscopy every 10 years starting at age 26.  Hepatitis C blood test.  Hepatitis B blood test.  Sexually transmitted disease (STD) testing.  Diabetes screening. This is done by checking your blood sugar (glucose) after you have not eaten for a while (fasting). You may have this done every 1-3 years.  Bone density scan. This is done to screen for osteoporosis. You may have this done starting at age 82.  Mammogram. This may be done every 1-2 years. Talk to your health care provider  about how often you should have regular mammograms. Talk with your health care provider about your test results, treatment options, and if necessary, the need for more tests. Vaccines  Your health care provider may recommend certain vaccines, such as:  Influenza vaccine. This is  recommended every year.  Tetanus, diphtheria, and acellular pertussis (Tdap, Td) vaccine. You may need a Td booster every 10 years.  Zoster vaccine. You may need this after age 66.  Pneumococcal 13-valent conjugate (PCV13) vaccine. One dose is recommended after age 6.  Pneumococcal polysaccharide (PPSV23) vaccine. One dose is recommended after age 69. Talk to your health care provider about which screenings and vaccines you need and how often you need them. This information is not intended to replace advice given to you by your health care provider. Make sure you discuss any questions you have with your health care provider. Document Released: 03/31/2015 Document Revised: 11/22/2015 Document Reviewed: 01/03/2015 Elsevier Interactive Patient Education  2017 [place] Prevention in the Home Falls can cause injuries. They can happen to people of all ages. There are many things you can do to make your home safe and to help prevent falls. What can I do on the outside of my home?  Regularly fix the edges of walkways and driveways and fix any cracks.  Remove anything that might make you trip as you walk through a door, such as a raised step or threshold.  Trim any bushes or trees on the path to your home.  Use bright outdoor lighting.  Clear any walking paths of anything that might make someone trip, such as rocks or tools.  Regularly check to see if handrails are loose or broken. Make sure that both sides of any steps have handrails.  Any raised decks and porches should have guardrails on the edges.  Have any leaves, snow, or ice cleared regularly.  Use sand or salt on walking paths during winter.  Clean up any spills in your garage right away. This includes oil or grease spills. What can I do in the bathroom?  Use night lights.  Install grab bars by the toilet and in the tub and shower. Do not use towel bars as grab bars.  Use non-skid mats or decals in the tub or  shower.  If you need to sit down in the shower, use a plastic, non-slip stool.  Keep the floor dry. Clean up any water that spills on the floor as soon as it happens.  Remove soap buildup in the tub or shower regularly.  Attach bath mats securely with double-sided non-slip rug tape.  Do not have throw rugs and other things on the floor that can make you trip. What can I do in the bedroom?  Use night lights.  Make sure that you have a light by your bed that is easy to reach.  Do not use any sheets or blankets that are too big for your bed. They should not hang down onto the floor.  Have a firm chair that has side arms. You can use this for support while you get dressed.  Do not have throw rugs and other things on the floor that can make you trip. What can I do in the kitchen?  Clean up any spills right away.  Avoid walking on wet floors.  Keep items that you use a lot in easy-to-reach places.  If you need to reach something above you, use a strong step stool that has a grab bar.  Keep electrical cords out of the way.  Do not use floor polish or wax that makes floors slippery. If you must use wax, use non-skid floor wax.  Do not have throw rugs and other things on the floor that can make you trip. What can I do with my stairs?  Do not leave any items on the stairs.  Make sure that there are handrails on both sides of the stairs and use them. Fix handrails that are broken or loose. Make sure that handrails are as long as the stairways.  Check any carpeting to make sure that it is firmly attached to the stairs. Fix any carpet that is loose or worn.  Avoid having throw rugs at the top or bottom of the stairs. If you do have throw rugs, attach them to the floor with carpet tape.  Make sure that you have a light switch at the top of the stairs and the bottom of the stairs. If you do not have them, ask someone to add them for you. What else can I do to help prevent  falls?  Wear shoes that:  Do not have high heels.  Have rubber bottoms.  Are comfortable and fit you well.  Are closed at the toe. Do not wear sandals.  If you use a stepladder:  Make sure that it is fully opened. Do not climb a closed stepladder.  Make sure that both sides of the stepladder are locked into place.  Ask someone to hold it for you, if possible.  Clearly mark and make sure that you can see:  Any grab bars or handrails.  First and last steps.  Where the edge of each step is.  Use tools that help you move around (mobility aids) if they are needed. These include:  Canes.  Walkers.  Scooters.  Crutches.  Turn on the lights when you go into a dark area. Replace any light bulbs as soon as they burn out.  Set up your furniture so you have a clear path. Avoid moving your furniture around.  If any of your floors are uneven, fix them.  If there are any pets around you, be aware of where they are.  Review your medicines with your doctor. Some medicines can make you feel dizzy. This can increase your chance of falling. Ask your doctor what other things that you can do to help prevent falls. This information is not intended to replace advice given to you by your health care provider. Make sure you discuss any questions you have with your health care provider. Document Released: 12/29/2008 Document Revised: 08/10/2015 Document Reviewed: 04/08/2014 Elsevier Interactive Patient Education  2017 [employer].

## 2019-11-18 NOTE — Progress Notes (Addendum)
Virtual Visit via Telephone Note  I connected with  [name] on 11/18/19 at 11:45 AM EDT by telephone and verified that I am speaking with the correct person using two identifiers.  Medicare Annual Wellness visit completed telephonically due to Covid-19 pandemic.   Persons participating in this call: This Health Coach and this patient.   Location: Patient: Home Provider: Office   I discussed the limitations, risks, security and privacy concerns of performing an evaluation and management service by telephone and the availability of in person appointments. The patient expressed understanding and agreed to proceed.  Unable to perform video visit due to video visit attempted and failed and/or patient does not have video capability.   Some vital signs may be absent or patient reported.   Willette Brace, LPN    Subjective:   [name] is a 74 y.o. female who presents for an Initial Medicare Annual Wellness Visit.  Review of Systems     Cardiac Risk Factors include: advanced age (>24mn, >>69women);diabetes mellitus;hypertension;dyslipidemia     Objective:    There were no vitals filed for this visit. There is no height or weight on file to calculate BMI.  Advanced Directives 11/18/2019 05/29/2019 09/25/2018 06/04/2016 06/01/2016 05/24/2012  Does Patient Have a Medical Advance Directive? No No No No No Patient does not have advance directive;Patient would like information  Would patient like information on creating a medical advance directive? Yes (MAU/Ambulatory/Procedural Areas - Information given) No - Guardian declined No - Patient declined - No - Patient declined Referral made to social work  Pre-existing out of facility DNR order (yellow form or pink MOST form) - - - - - No  Some encounter information is confidential and restricted. Go to Review Flowsheets activity to see all data.    Current Medications (verified) Outpatient Encounter Medications as of 11/18/2019    Medication Sig  . ACCU-CHEK AVIVA PLUS test strip   . ALPRAZolam (XANAX) 0.5 MG tablet Take 1 tablet by mouth daily as needed for anxiety.  .Marland KitchenamLODipine (NORVASC) 5 MG tablet Take 5 mg by mouth daily.  .Marland Kitchenaspirin EC 81 MG tablet Take 81 mg by mouth daily.  . Blood Glucose Monitoring Suppl (ACCU-CHEK GUIDE) w/Device KIT   . cyanocobalamin (,VITAMIN B-12,) 1000 MCG/ML injection Inject 1,000 mcg into the muscle every 14 (fourteen) days.   .Marland Kitchenescitalopram (LEXAPRO) 10 MG tablet Take 1 tablet (10 mg total) by mouth daily.  .Marland Kitchenlevothyroxine (SYNTHROID) 125 MCG tablet Take 125 mcg by mouth daily.  . [DISCONTINUED] prednisoLONE acetate (PRED FORTE) 1 % ophthalmic suspension Place 1 drop into the left eye 4 (four) times daily. (Patient not taking: Reported on 11/18/2019)   No facility-administered encounter medications on file as of 11/18/2019.    Allergies (verified) Penicillins   History: Past Medical History:  Diagnosis Date  . Anxiety   . Arthritis   . Depression   . Hyperlipemia   . Hypertension   . Thyroid disease   . TIA (transient ischemic attack)   . Vitamin B12 deficiency 10/20/2019   Past Surgical History:  Procedure Laterality Date  . ABDOMINAL HYSTERECTOMY    . BREAST BIOPSY    . TUBAL LIGATION     Family History  Problem Relation Age of Onset  . Alcohol abuse Brother   . Heart disease Brother   . Diabetes Mother   . Alzheimer's disease Mother   . Dementia Mother   . COPD Father   . Emphysema Father   .  Heart attack Daughter   . Heart disease Sister   . Emphysema Brother    Social History   Socioeconomic History  . Marital status: Married    Spouse name: Not on file  . Number of children: Not on file  . Years of education: Not on file  . Highest education level: Not on file  Occupational History  . Occupation: retired    Comment: Geophysicist/field seismologist  . Occupation: caregiver  Tobacco Use  . Smoking status: Former Smoker    Types: Cigarettes    Quit date: 05/25/1987     Years since quitting: 32.5  . Smokeless tobacco: Never Used  Vaping Use  . Vaping Use: Never used  Substance and Sexual Activity  . Alcohol use: Yes    Alcohol/week: 21.0 standard drinks    Types: 21 Glasses of wine per week    Comment: 3 glasses of wine daily  . Drug use: No  . Sexual activity: Not Currently    Birth control/protection: Post-menopausal  Other Topics Concern  . Not on file  Social History Narrative  . Not on file   Social Determinants of Health   Financial Resource Strain: Low Risk   . Difficulty of Paying Living Expenses: Not hard at all  Food Insecurity: No Food Insecurity  . Worried About Charity fundraiser in the Last Year: Never true  . Ran Out of Food in the Last Year: Never true  Transportation Needs: No Transportation Needs  . Lack of Transportation (Medical): No  . Lack of Transportation (Non-Medical): No  Physical Activity: Insufficiently Active  . Days of Exercise per Week: 5 days  . Minutes of Exercise per Session: 20 min  Stress: Stress Concern Present  . Feeling of Stress : To some extent  Social Connections: Moderately Integrated  . Frequency of Communication with Friends and Family: More than three times a week  . Frequency of Social Gatherings with Friends and Family: Twice a week  . Attends Religious Services: Never  . Active Member of Clubs or Organizations: Yes  . Attends Archivist Meetings: Never  . Marital Status: Married    Tobacco Counseling Counseling given: Not Answered   Clinical Intake:  Pre-visit preparation completed: Yes  Pain : No/denies pain     BMI - recorded: 28.19 Nutritional Status: BMI 25 -29 Overweight Diabetes: Yes CBG done?: Yes (146) Did pt. bring in CBG monitor from home?: No  How often do you need to have someone help you when you read instructions, pamphlets, or other written materials from your doctor or pharmacy?: 1 - Never  Diabetic?Yes  Interpreter Needed?:  No  Information entered by :: Charlott Rakes, LPN   Activities of Daily Living In your present state of health, do you have any difficulty performing the following activities: 11/18/2019 10/20/2019  Hearing? N N  Vision? N N  Difficulty concentrating or making decisions? Y N  Comment at times -  Walking or climbing stairs? Y N  Comment balance issues at times -  Dressing or bathing? Y N  Comment husband helps as needed -  Doing errands, shopping? N N  Preparing Food and eating ? N -  Using the Toilet? N -  Managing your Medications? N -  Managing your Finances? N -  Housekeeping or managing your Housekeeping? N -  Some recent data might be hidden    Patient Care Team: [name], MD as PCP - General (Family Medicine) Izora Gala, MD as Consulting  Physician (Otolaryngology)  Indicate any recent Medical Services you may have received from other than [facility] providers in the past year (date may be approximate).     Assessment:   This is a routine wellness examination for [place].  Hearing/Vision screen  Hearing Screening   '125Hz'  '250Hz'  '500Hz'  '1000Hz'  '2000Hz'  '3000Hz'  '4000Hz'  '6000Hz'  '8000Hz'   Right ear:           Left ear:           Comments: Pt denies any difficulty hearing at this time  Vision Screening Comments: Follows up with [facility] annually  Dietary issues and exercise activities discussed: Current Exercise Habits: Home exercise routine, Type of exercise: walking, Time (Minutes): 20, Frequency (Times/Week): 5, Weekly Exercise (Minutes/Week): 100  Goals    . Patient Stated     Stay on diabetic diet to avoid medications      Depression Screen PHQ 2/9 Scores 11/18/2019 10/20/2019  PHQ - 2 Score 2 5  PHQ- 9 Score 5 12    Fall Risk Fall Risk  11/18/2019 10/20/2019 07/15/2016  Falls in the past year? 0 0 No  Number falls in past yr: 0 0 -  Injury with Fall? 0 0 -  Risk for fall due to : Impaired balance/gait;Impaired vision - -  Follow up Falls prevention discussed - -     Any stairs in or around the home? Yes  If so, are there any without handrails? No  Home free of loose throw rugs in walkways, pet beds, electrical cords, etc? Yes  Adequate lighting in your home to reduce risk of falls? Yes   ASSISTIVE DEVICES UTILIZED TO PREVENT FALLS:  Life alert? No  Use of a cane, walker or w/c? No  Grab bars in the bathroom? Yes  Shower chair or bench in shower? Yes  Elevated toilet seat or a handicapped toilet? No   TIMED UP AND GO:  Was the test performed? No .    Cognitive Function: MMSE - Mini Mental State Exam 07/15/2016  Not completed: Refused     6CIT Screen 11/18/2019  What Year? 0 points  What month? 0 points  Count back from 20 0 points  Months in reverse 0 points  Repeat phrase 2 points    Immunizations Immunization History  Administered Date(s) Administered  . Fluad Quad(high Dose 65+) 10/30/2018  . Influenza, High Dose Seasonal PF 12/13/2013, 10/30/2018  . Influenza-Unspecified 12/16/2013  . Moderna SARS-COVID-2 Vaccination 03/30/2019, 04/30/2019  . Pneumococcal Conjugate-13 03/09/2014  . Pneumococcal Polysaccharide-23 06/24/2011  . Tdap 02/12/2006  . Zoster Recombinat (Shingrix) 07/27/2017    TDAP status: Due, Education has been provided regarding the importance of this vaccine. Advised may receive this vaccine at local pharmacy or Health Dept. Aware to provide a copy of the vaccination record if obtained from local pharmacy or Health Dept. Verbalized acceptance and understanding. Flu Vaccine status: Up to date Pneumococcal vaccine status: Up to date Covid-19 vaccine status: Completed vaccines  Qualifies for Shingles Vaccine? Yes   Zostavax completed Yes   Shingrix Completed?: No.    Education has been provided regarding the importance of this vaccine. Patient has been advised to call insurance company to determine out of pocket expense if they have not yet received this vaccine. Advised may also receive vaccine at local  pharmacy or Health Dept. Verbalized acceptance and understanding.  Screening Tests Health Maintenance  Topic Date Due  . Hepatitis C Screening  Never done  . TETANUS/TDAP  02/13/2016  . INFLUENZA VACCINE  10/17/2019  . HEMOGLOBIN A1C  04/20/2020  . MAMMOGRAM  08/23/2020  . OPHTHALMOLOGY EXAM  08/30/2020  . FOOT EXAM  11/07/2020  . URINE MICROALBUMIN  11/07/2020  . DEXA SCAN  Completed  . COVID-19 Vaccine  Completed  . PNA vac Low Risk Adult  Completed    Health Maintenance  Health Maintenance Due  Topic Date Due  . Hepatitis C Screening  Never done  . TETANUS/TDAP  02/13/2016  . INFLUENZA VACCINE  10/17/2019    Colorectal cancer screening: Completed 07/12/13. Repeat every 10 years Mammogram status: Completed 08/24/18. Repeat every year Bone density 10/01/18    Additional Screening:  Hepatitis C Screening: does qualify;   Vision Screening: Recommended annual ophthalmology exams for early detection of glaucoma and other disorders of the eye. Is the patient up to date with their annual eye exam?  Yes  Who is the provider or what is the name of the office in which the patient attends annual eye exams? [facility]   Dental Screening: Recommended annual dental exams for proper oral hygiene  Community Resource Referral / Chronic Care Management: CRR required this visit?  No   CCM required this visit?  No      Plan:     I have personally reviewed and noted the following in the patient's chart:   . Medical and social history . Use of alcohol, tobacco or illicit drugs  . Current medications and supplements . Functional ability and status . Nutritional status . Physical activity . Advanced directives . List of other physicians . Hospitalizations, surgeries, and ER visits in previous 12 months . Vitals . Screenings to include cognitive, depression, and falls . Referrals and appointments  In addition, I have reviewed and discussed with patient certain preventive  protocols, quality metrics, and best practice recommendations. A written personalized care plan for preventive services as well as general preventive health recommendations were provided to patient.     Willette Brace, LPN   08/23/9568   Nurse Notes: None

## 2019-11-24 ENCOUNTER — Other Ambulatory Visit: Payer: Self-pay

## 2019-11-24 ENCOUNTER — Ambulatory Visit (INDEPENDENT_AMBULATORY_CARE_PROVIDER_SITE_OTHER): Payer: Medicare HMO

## 2019-11-24 DIAGNOSIS — E538 Deficiency of other specified B group vitamins: Secondary | ICD-10-CM

## 2019-11-24 MED ORDER — CYANOCOBALAMIN 1000 MCG/ML IJ SOLN
1000.0000 ug | Freq: Once | INTRAMUSCULAR | Status: AC
  Administered 2019-11-24: 1000 ug via INTRAMUSCULAR

## 2019-11-24 NOTE — Progress Notes (Signed)
Per orders of Dr. Jonni Sanger , injection of Vitamin B12 given by Tobe Sos in left deltoid. Patient tolerated injection well. Patient will make appointment for 2 weeks. Pt brings in her own medication, and syringe.

## 2019-12-08 ENCOUNTER — Encounter: Payer: Self-pay | Admitting: Family Medicine

## 2019-12-08 ENCOUNTER — Ambulatory Visit (INDEPENDENT_AMBULATORY_CARE_PROVIDER_SITE_OTHER): Payer: Medicare HMO

## 2019-12-08 ENCOUNTER — Other Ambulatory Visit: Payer: Self-pay | Admitting: Family Medicine

## 2019-12-08 ENCOUNTER — Other Ambulatory Visit: Payer: Self-pay

## 2019-12-08 DIAGNOSIS — Z23 Encounter for immunization: Secondary | ICD-10-CM | POA: Diagnosis not present

## 2019-12-08 DIAGNOSIS — E538 Deficiency of other specified B group vitamins: Secondary | ICD-10-CM

## 2019-12-08 MED ORDER — CYANOCOBALAMIN 1000 MCG/ML IJ SOLN
1000.0000 ug | Freq: Once | INTRAMUSCULAR | Status: AC
  Administered 2019-12-08: 1000 ug via INTRAMUSCULAR

## 2019-12-08 NOTE — Progress Notes (Signed)
Per orders of Dr. Jonni Sanger , injection of Vitamin B12 given by Tobe Sos in right deltoid. Patient tolerated injection well. Patient will make appointment for 2 weeks.

## 2019-12-14 ENCOUNTER — Encounter: Payer: Self-pay | Admitting: Family Medicine

## 2019-12-14 ENCOUNTER — Other Ambulatory Visit: Payer: Self-pay

## 2019-12-14 ENCOUNTER — Ambulatory Visit (INDEPENDENT_AMBULATORY_CARE_PROVIDER_SITE_OTHER): Payer: Medicare HMO | Admitting: Family Medicine

## 2019-12-14 VITALS — BP 134/80 | HR 45 | Temp 98.1°F | Resp 17 | Ht 66.0 in | Wt 174.8 lb

## 2019-12-14 DIAGNOSIS — N6002 Solitary cyst of left breast: Secondary | ICD-10-CM

## 2019-12-14 MED ORDER — CEPHALEXIN 500 MG PO CAPS: 500.0000 mg | ORAL_CAPSULE | Freq: Two times a day (BID) | ORAL | 0 refills | Status: DC

## 2019-12-14 NOTE — Patient Instructions (Signed)
Please take the antibiotics as directed and apply warm compresses twice a day to the area.  I'm hopeful the infection will be cleared so nothing further will be needed.  advil will be helpful for the pain.   Breast Cyst  A breast cyst is a sac in the breast that is filled with fluid. They are usually noncancerous (benign) and are common among women. Breast cysts are most often in the upper, outer portion of the breast. One or more cysts may develop. They form when fluid builds up inside the breast glands. There are several types of breast cysts. Some are too small to feel, but these can be seen with imaging tests such as an X-ray of the breast (mammogram) or ultrasound. Breast cysts do not increase your risk of breast cancer. They usually disappear after you no longer have a menstrual cycle (after menopause), unless you take artificial hormones (are on hormone therapy). What are the causes? This condition may be caused by:  Blockage of tubes (ducts) in the breast glands, which leads to fluid buildup. Duct blockage may result from: ? Fibrocystic breast changes. This is a common, benign condition that occurs when women go through hormonal changes during the menstrual cycle. This is a common cause of multiple breast cysts. ? Overgrowth of breast tissue or breast glands. ? Scar tissue in the breast from previous surgery.  Changes in certain female hormones (estrogen and progesterone). The exact cause of this condition is not known. What increases the risk? You may be more likely to develop breast cysts if you have not gone through menopause. What are the signs or symptoms? Symptoms of this condition include:  Feeling one or more smooth, round, soft lumps (like grapes) in the breast that are easily movable. The lump or lumps may get bigger and more painful before your menstrual period and get smaller after your menstrual period.  Breast discomfort or pain. How is this diagnosed? This condition  may be diagnosed based on:  A physical exam. A cyst can be felt by your health care provider during this exam.  Imaging tests, such as mammogram or ultrasound. Fluid may be removed from the cyst with a needle (fine-needle aspiration) and tested to make sure the cyst is not cancerous. How is this treated? Treatment may not be needed for this condition. Your health care provider may monitor the cyst to see if it goes away on its own. If the cyst is uncomfortable or gets bigger, or if you do not like how the cyst makes your breast look, you may need treatment. Treatment may include:  Hormone therapy.  Fine-needle aspiration to drain fluid from the cyst. There is a chance of the cyst coming back (recurring) after aspiration.  Surgery to remove the cyst. Follow these instructions at home: Self-exams   Do a breast self-exam every month, or as often as directed. A breast self-exam involves: ? Comparing your breasts in the mirror. ? Looking for visible changes in your skin or nipples. ? Feeling for lumps or changes.  Having many breast cysts may make it harder to feel for new lumps. Understand how your breasts normally look and feel, and write down any changes in your breasts. Tell your health care provider about any changes. Eating and drinking  Follow instructions from your health care provider about eating and drinking restrictions.  Drink enough fluid to keep your urine pale yellow.  Avoid caffeine.  Cut down on salt (sodium) in what you eat and drink,  especially before your menstrual period. Too much sodium can cause fluid buildup, breast swelling, and discomfort. General instructions  See your health care provider regularly. ? Get a yearly physical exam. ? If you are 84-60 years old, get a clinical breast exam every 1-3 years. After the age of 35 years, get this exam every year. ? Get mammograms as often as directed.  Take over-the-counter and prescription medicines only as told  by your health care provider.  Wear a supportive bra, especially when exercising.  Keep all follow-up visits as told by your health care provider. This is important. Contact a health care provider if:  You feel, or think you feel, a lump in your breast.  You notice that both breasts look or feel different than usual.  Your breast is still causing pain after your menstrual period is over.  You find new lumps or bumps that were not there before.  You feel lumps in your armpit. Get help right away if:  You have severe pain, tenderness, redness, or warmth in your breast.  You have fluid or blood leaking from your nipple.  Your breast lump becomes hard and painful.  You notice dimpling or wrinkling of the breast or nipple. Summary  A breast cyst is a sac in the breast that is filled with fluid.  Treatment may not be needed for this condition.  If the cyst is uncomfortable or gets bigger, or if you do not like how the cyst makes your breast look, you may need treatment. This information is not intended to replace advice given to you by your health care provider. Make sure you discuss any questions you have with your health care provider. Document Revised: 07/21/2018 Document Reviewed: 07/21/2018 Elsevier Patient Education  [place].

## 2019-12-14 NOTE — Progress Notes (Signed)
Subjective  CC:  Chief Complaint  Patient presents with  . Cyst    under right breast, noticed over a week ago    HPI: [name] is a 74 y.o. female who presents to the office today to address the problems listed above in the chief complaint.  Patient reports she had a mammogram done about 3 weeks ago.  She had a left abnormality and mammogram and follow-up ultrasound reportedly showed a cyst.  In the last week, cyst enlarged and is now red and sore.  It is not draining.  She has had no systemic symptoms.  I have called for records. Assessment  1. Cyst, breast solitary, left      Plan   Infected left breast cyst: We will review records and verify it is in the right location.  Start warm compresses, Advil and Keflex twice daily.  Follow-up if worsening.  Hopefully we can get the infection cleared and nothing further will be needed.  Penicillin allergy noted: No history of anaphylaxis.  Keflex ordered.  Follow up: Return if symptoms worsen or fail to improve.  12/22/2019  No orders of the defined types were placed in this encounter.  Meds ordered this encounter  Medications  . cephALEXin (KEFLEX) 500 MG capsule    Sig: Take 1 capsule (500 mg total) by mouth 2 (two) times daily.    Dispense:  14 capsule    Refill:  0      I reviewed the patients updated PMH, FH, and SocHx.    Patient Active Problem List   Diagnosis Date Noted  . GAD (generalized anxiety disorder) 10/20/2019    Priority: High  . Major depression, recurrent, chronic ([place]) 10/20/2019    Priority: High  . Diet-controlled diabetes mellitus ([place]) 10/20/2019    Priority: High  . Essential tremor 04/13/2019    Priority: High  . Acquired hypothyroidism 05/24/2012    Priority: High  . Alcohol abuse, daily use 05/24/2012    Priority: High  . Essential hypertension     Priority: High  . Mixed hyperlipidemia     Priority: High  . Osteoarthritis, multiple sites 10/20/2019    Priority: Medium  .  Bilateral primary osteoarthritis of knee 09/23/2017    Priority: Medium  . Vitamin B12 deficiency 10/20/2019    Priority: Low  . Presbycusis of both ears 04/13/2019    Priority: Low   Current Meds  Medication Sig  . ACCU-CHEK AVIVA PLUS test strip   . ALPRAZolam (XANAX) 0.5 MG tablet Take 1 tablet by mouth daily as needed for anxiety.  Marland Kitchen amLODipine (NORVASC) 5 MG tablet TAKE 1 TABLET EVERY DAY  . aspirin EC 81 MG tablet Take 81 mg by mouth daily.  . Blood Glucose Monitoring Suppl (ACCU-CHEK GUIDE) w/Device KIT   . cyanocobalamin (,VITAMIN B-12,) 1000 MCG/ML injection Inject 1,000 mcg into the muscle every 14 (fourteen) days.   Marland Kitchen escitalopram (LEXAPRO) 10 MG tablet Take 1 tablet (10 mg total) by mouth daily.  Marland Kitchen levothyroxine (SYNTHROID) 125 MCG tablet Take 125 mcg by mouth daily.    Allergies: Patient is allergic to penicillins. Family History: Patient family history includes Alcohol abuse in her brother; Alzheimer's disease in her mother; COPD in her father; Dementia in her mother; Diabetes in her mother; Emphysema in her brother and father; Heart attack in her daughter; Heart disease in her brother and sister. Social History:  Patient  reports that she quit smoking about 32 years ago. Her smoking use included  cigarettes. She has never used smokeless tobacco. She reports current alcohol use of about 21.0 standard drinks of alcohol per week. She reports that she does not use drugs.  Review of Systems: Constitutional: Negative for fever malaise or anorexia Cardiovascular: negative for chest pain Respiratory: negative for SOB or persistent cough Gastrointestinal: negative for abdominal pain  Objective  Vitals: BP 134/80   Pulse (!) 45   Temp 98.1 F (36.7 C) (Temporal)   Resp 17   Ht '5\' 6"'  (1.676 m)   Wt 174 lb 12.8 oz (79.3 kg)   SpO2 98%   BMI 28.21 kg/m  General: no acute distress , A&Ox3 Left breast: Inferior breast at 9:00 with 1 cm erythematous tender cystic lesion.  No  drainage.    Commons side effects, risks, benefits, and alternatives for medications and treatment plan prescribed today were discussed, and the patient expressed understanding of the given instructions. Patient is instructed to call or message via MyChart if he/she has any questions or concerns regarding our treatment plan. No barriers to understanding were identified. We discussed Red Flag symptoms and signs in detail. Patient expressed understanding regarding what to do in case of urgent or emergency type symptoms.   Medication list was reconciled, printed and provided to the patient in AVS. Patient instructions and summary information was reviewed with the patient as documented in the AVS. This note was prepared with assistance of Dragon voice recognition software. Occasional wrong-word or sound-a-like substitutions may have occurred due to the inherent limitations of voice recognition software  This visit occurred during the SARS-CoV-2 public health emergency.  Safety protocols were in place, including screening questions prior to the visit, additional usage of staff PPE, and extensive cleaning of exam room while observing appropriate contact time as indicated for disinfecting solutions.

## 2019-12-22 ENCOUNTER — Ambulatory Visit (INDEPENDENT_AMBULATORY_CARE_PROVIDER_SITE_OTHER): Payer: Medicare HMO

## 2019-12-22 ENCOUNTER — Other Ambulatory Visit: Payer: Self-pay

## 2019-12-22 DIAGNOSIS — E538 Deficiency of other specified B group vitamins: Secondary | ICD-10-CM

## 2019-12-22 MED ORDER — CYANOCOBALAMIN 1000 MCG/ML IJ SOLN
1000.0000 ug | Freq: Once | INTRAMUSCULAR | Status: AC
  Administered 2019-12-22: 1000 ug via INTRAMUSCULAR

## 2019-12-22 NOTE — Progress Notes (Signed)
[name] 74 y.o. female presents to office for bi-weekly B12 injection per Billey Chang, MD. Administered CYANOCOBALAMIN 1,000 mcg/mL IM left arm. Patient tolerated well. Patient aware to return in two weeks for next injection.

## 2020-01-05 ENCOUNTER — Other Ambulatory Visit: Payer: Self-pay

## 2020-01-05 ENCOUNTER — Ambulatory Visit (INDEPENDENT_AMBULATORY_CARE_PROVIDER_SITE_OTHER): Payer: Medicare HMO

## 2020-01-05 DIAGNOSIS — E538 Deficiency of other specified B group vitamins: Secondary | ICD-10-CM | POA: Diagnosis not present

## 2020-01-05 MED ORDER — CYANOCOBALAMIN 1000 MCG/ML IJ SOLN
1000.0000 ug | Freq: Once | INTRAMUSCULAR | Status: AC
  Administered 2020-01-05: 1000 ug via INTRAMUSCULAR

## 2020-01-05 NOTE — Progress Notes (Signed)
Patient came into the office today to receive her vitamin b12 injection. She brings her own b12. She received her shot in her right arm. Tolerated injection well. No other questions or concerns at this time.

## 2020-01-15 ENCOUNTER — Ambulatory Visit: Payer: Medicare HMO

## 2020-01-20 ENCOUNTER — Ambulatory Visit: Payer: Medicare HMO | Admitting: Family Medicine

## 2020-01-27 ENCOUNTER — Ambulatory Visit (INDEPENDENT_AMBULATORY_CARE_PROVIDER_SITE_OTHER): Payer: Medicare HMO | Admitting: Family Medicine

## 2020-01-27 ENCOUNTER — Encounter: Payer: Self-pay | Admitting: Family Medicine

## 2020-01-27 ENCOUNTER — Other Ambulatory Visit: Payer: Self-pay

## 2020-01-27 VITALS — BP 118/84 | HR 77 | Temp 97.0°F | Wt 174.0 lb

## 2020-01-27 DIAGNOSIS — E538 Deficiency of other specified B group vitamins: Secondary | ICD-10-CM | POA: Diagnosis not present

## 2020-01-27 DIAGNOSIS — Z1159 Encounter for screening for other viral diseases: Secondary | ICD-10-CM | POA: Diagnosis not present

## 2020-01-27 DIAGNOSIS — I10 Essential (primary) hypertension: Secondary | ICD-10-CM

## 2020-01-27 DIAGNOSIS — E119 Type 2 diabetes mellitus without complications: Secondary | ICD-10-CM

## 2020-01-27 DIAGNOSIS — E782 Mixed hyperlipidemia: Secondary | ICD-10-CM | POA: Diagnosis not present

## 2020-01-27 LAB — POCT GLYCOSYLATED HEMOGLOBIN (HGB A1C): Hemoglobin A1C: 6.3 % — AB (ref 4.0–5.6)

## 2020-01-27 MED ORDER — ROSUVASTATIN CALCIUM 5 MG PO TABS: 5.0000 mg | ORAL_TABLET | ORAL | 3 refills | Status: DC

## 2020-01-27 MED ORDER — CYANOCOBALAMIN 1000 MCG/ML IJ SOLN
1000.0000 ug | Freq: Once | INTRAMUSCULAR | Status: AC
  Administered 2020-01-27: 1000 ug via INTRAMUSCULAR

## 2020-01-27 NOTE — Progress Notes (Signed)
Subjective  CC:  Chief Complaint  Patient presents with  . vitamin B12 deficiency  . Hypertension  . Diabetes  . Hyperlipidemia    HPI: [name] is a 74 y.o. female who presents to the office today for follow up of diabetes and problems listed above in the chief complaint.   Diabetes follow up: Her diabetic control is reported as Unchanged. Doing fine, diet controlled  She denies exertional CP or SOB or symptomatic hypoglycemia. She denies foot sores or paresthesias.   Vit b12 defic on bimonthly b12 injections here for 3 month recheck to ensure stable.   HTN: good control - has been on amlodipine for a number of years and is hesitant to change. Urine microalbuminuria is negative. She is not on an ace and does not want to start one now.   HLD: never started lipitor due to h/o myalgias.    Wt Readings from Last 3 Encounters:  01/27/20 174 lb (78.9 kg)  12/14/19 174 lb 12.8 oz (79.3 kg)  11/08/19 174 lb 9.6 oz (79.2 kg)    BP Readings from Last 3 Encounters:  01/27/20 118/84  12/14/19 134/80  11/08/19 140/82    Assessment  1. Diet-controlled diabetes mellitus ([place])   2. Essential hypertension   3. Vitamin B12 deficiency   4. Mixed hyperlipidemia      Plan   Diabetes is currently very well controlled. Diet controlled  HLD: trial of low dose crestor with intermittent dosing and coq10. Educated on benefits. Pt willing to start it.   b12 defic for recheck and supplement today  HTN remains controlled. No ace per pt preference. On low dose amlodipine.  Follow up: 3 mo f/u. Orders Placed This Encounter  Procedures  . Vitamin B12   No orders of the defined types were placed in this encounter.     Immunization History  Administered Date(s) Administered  . Fluad Quad(high Dose 65+) 10/30/2018, 12/08/2019  . Influenza, High Dose Seasonal PF 12/13/2013, 10/30/2018  . Influenza-Unspecified 12/16/2013  . Moderna SARS-COVID-2 Vaccination 03/30/2019,  04/30/2019  . Pneumococcal Conjugate-13 03/09/2014  . Pneumococcal Polysaccharide-23 06/24/2011  . Tdap 02/12/2006  . Zoster Recombinat (Shingrix) 07/27/2017    Diabetes Related Lab Review: Lab Results  Component Value Date   HGBA1C 6.4 (H) 05/24/2012    Lab Results  Component Value Date   MICROALBUR 0.5 11/08/2019   Lab Results  Component Value Date   CREATININE 0.84 11/08/2019   BUN 13 11/08/2019   NA 137 11/08/2019   K 4.8 11/08/2019   CL 101 11/08/2019   CO2 27 11/08/2019   Lab Results  Component Value Date   CHOL 224 (H) 11/08/2019   CHOL 221 (H) 05/25/2012   Lab Results  Component Value Date   HDL 65 11/08/2019   HDL 58 05/25/2012   Lab Results  Component Value Date   LDLCALC 137 (H) 11/08/2019   LDLCALC 138 (H) 05/25/2012   Lab Results  Component Value Date   TRIG 110 11/08/2019   TRIG 127 05/25/2012   Lab Results  Component Value Date   CHOLHDL 3.4 11/08/2019   CHOLHDL 3.8 05/25/2012   No results found for: LDLDIRECT The 10-year ASCVD risk score [name] DC Jr., et al., 2013) is: 35.7%   Values used to calculate the score:     Age: 52 years     Sex: Female     Is Non-Hispanic African American: No     Diabetic: Yes     Tobacco  smoker: No     Systolic Blood Pressure: 573 mmHg     Is BP treated: Yes     HDL Cholesterol: 65 mg/dL     Total Cholesterol: 224 mg/dL I have reviewed the PMH, Fam and Soc history. Patient Active Problem List   Diagnosis Date Noted  . GAD (generalized anxiety disorder) 10/20/2019    Priority: High  . Major depression, recurrent, chronic ([place]) 10/20/2019    Priority: High  . Diet-controlled diabetes mellitus ([place]) 10/20/2019    Priority: High  . Essential tremor 04/13/2019    Priority: High  . Acquired hypothyroidism 05/24/2012    Priority: High  . Alcohol abuse, daily use 05/24/2012    Priority: High  . Essential hypertension     Priority: High  . Mixed hyperlipidemia     Priority: High  . Osteoarthritis,  multiple sites 10/20/2019    Priority: Medium    Neck, back, hands, shoulders, knees.  He has seen [facility] in the past   . Bilateral primary osteoarthritis of knee 09/23/2017    Priority: Medium  . Vitamin B12 deficiency 10/20/2019    Priority: Low  . Presbycusis of both ears 04/13/2019    Priority: Low    Social History: Patient  reports that she quit smoking about 32 years ago. Her smoking use included cigarettes. She has never used smokeless tobacco. She reports current alcohol use of about 21.0 standard drinks of alcohol per week. She reports that she does not use drugs.  Review of Systems: Ophthalmic: negative for eye pain, loss of vision or double vision Cardiovascular: negative for chest pain Respiratory: negative for SOB or persistent cough Gastrointestinal: negative for abdominal pain Genitourinary: negative for dysuria or gross hematuria MSK: negative for foot lesions Neurologic: negative for weakness or gait disturbance  Objective  Vitals: BP 118/84   Pulse 77   Temp (!) 97 F (36.1 C) (Temporal)   Wt 174 lb (78.9 kg)   SpO2 98%   BMI 28.08 kg/m  General: well appearing, no acute distress  Psych:  Alert and oriented, normal mood and affect HEENT:  Normocephalic, atraumatic, moist mucous membranes, supple neck  Cardiovascular:  Nl S1 and S2, RRR without murmur, gallop or rub. no edema Respiratory:  Good breath sounds bilaterally, CTAB with normal effort, no rales    Diabetic education: ongoing education regarding chronic disease management for diabetes was given today. We continue to reinforce the ABC's of diabetic management: A1c (<7 or 8 dependent upon patient), tight blood pressure control, and cholesterol management with goal LDL < 100 minimally. We discuss diet strategies, exercise recommendations, medication options and possible side effects. At each visit, we review recommended immunizations and preventive care recommendations for diabetics and stress  that good diabetic control can prevent other problems. See below for this patient's data.    Commons side effects, risks, benefits, and alternatives for medications and treatment plan prescribed today were discussed, and the patient expressed understanding of the given instructions. Patient is instructed to call or message via MyChart if he/she has any questions or concerns regarding our treatment plan. No barriers to understanding were identified. We discussed Red Flag symptoms and signs in detail. Patient expressed understanding regarding what to do in case of urgent or emergency type symptoms.   Medication list was reconciled, printed and provided to the patient in AVS. Patient instructions and summary information was reviewed with the patient as documented in the AVS. This note was prepared with assistance of Dragon voice recognition software.  Occasional wrong-word or sound-a-like substitutions may have occurred due to the inherent limitations of voice recognition software  This visit occurred during the SARS-CoV-2 public health emergency.  Safety protocols were in place, including screening questions prior to the visit, additional usage of staff PPE, and extensive cleaning of exam room while observing appropriate contact time as indicated for disinfecting solutions.

## 2020-01-27 NOTE — Patient Instructions (Signed)
Please return in 6 months to recheck diabetes, cholesterol and blood pressure.   You've had both pneumonia vaccinations. I found them in your records :)  Try the crestor 2-3x week to see if you tolerate it. Add CoQ10 daily as this can help. It is over the counter.   If you have any questions or concerns, please don't hesitate to send me a message via MyChart or call the office at [phone]. Thank you for visiting with [name] today! It's our pleasure caring for you.

## 2020-01-28 LAB — HEPATITIS C ANTIBODY
Hepatitis C Ab: NONREACTIVE
SIGNAL TO CUT-OFF: 0 (ref <1.00)

## 2020-01-28 LAB — VITAMIN B12: Vitamin B-12: 594 pg/mL (ref 200–1100)

## 2020-02-01 NOTE — Progress Notes (Signed)
Please call patient: I have reviewed his/her lab results. B12 levels are stable. Conitnue current schedule for b12 injections.

## 2020-02-09 ENCOUNTER — Ambulatory Visit (INDEPENDENT_AMBULATORY_CARE_PROVIDER_SITE_OTHER): Payer: Medicare HMO

## 2020-02-09 ENCOUNTER — Other Ambulatory Visit: Payer: Self-pay

## 2020-02-09 DIAGNOSIS — E538 Deficiency of other specified B group vitamins: Secondary | ICD-10-CM

## 2020-02-09 MED ORDER — CYANOCOBALAMIN 1000 MCG/ML IJ SOLN
1000.0000 ug | Freq: Once | INTRAMUSCULAR | Status: AC
  Administered 2020-02-09: 1000 ug via INTRAMUSCULAR

## 2020-02-09 NOTE — Progress Notes (Signed)
Per orders of Dr. Andy , injection of B-12 given by Nox Talent D Kalden Wanke in left deltoid. Patient tolerated injection well. Patient will make appointment for 2 weeks.   

## 2020-02-23 ENCOUNTER — Other Ambulatory Visit: Payer: Self-pay

## 2020-02-23 ENCOUNTER — Ambulatory Visit (INDEPENDENT_AMBULATORY_CARE_PROVIDER_SITE_OTHER): Payer: Medicare HMO | Admitting: *Deleted

## 2020-02-23 DIAGNOSIS — E538 Deficiency of other specified B group vitamins: Secondary | ICD-10-CM

## 2020-02-23 MED ORDER — CYANOCOBALAMIN 1000 MCG/ML IJ SOLN
1000.0000 ug | Freq: Once | INTRAMUSCULAR | Status: AC
  Administered 2020-02-23: 1000 ug via INTRAMUSCULAR

## 2020-02-23 NOTE — Progress Notes (Signed)
Per orders of Dr. Jonni Sanger , injection of B-12 given by Albertha Ghee  in right deltoid. Patient tolerated injection well. Patient will make appointment in two weeks .

## 2020-03-08 ENCOUNTER — Ambulatory Visit: Payer: Medicare HMO

## 2020-03-08 ENCOUNTER — Other Ambulatory Visit: Payer: Self-pay

## 2020-03-08 DIAGNOSIS — E538 Deficiency of other specified B group vitamins: Secondary | ICD-10-CM

## 2020-03-08 MED ORDER — CYANOCOBALAMIN 1000 MCG/ML IJ SOLN
1000.0000 ug | Freq: Once | INTRAMUSCULAR | Status: AC
  Administered 2020-03-08: 1000 ug via INTRAMUSCULAR

## 2020-03-08 MED ORDER — CYANOCOBALAMIN 1000 MCG/ML IJ SOLN: 1000.0000 ug | Freq: Once | INTRAMUSCULAR | Status: DC

## 2020-03-08 NOTE — Patient Instructions (Signed)
Health Maintenance Due  Topic Date Due  . COVID-19 Vaccine (3 - Moderna risk 4-dose series) 05/28/2019    Depression screen [facility] 2/9 11/18/2019 10/20/2019  Decreased Interest 1 2  Down, Depressed, Hopeless 1 3  PHQ - 2 Score 2 5  Altered sleeping 1 1  Tired, decreased energy 1 3  Change in appetite 0 0  Feeling bad or failure about yourself  0 0  Trouble concentrating 1 2  Moving slowly or fidgety/restless 0 1  Suicidal thoughts 0 0  PHQ-9 Score 5 12  Difficult doing work/chores Somewhat difficult Very difficult    Recommended follow up: No follow-ups on file.

## 2020-03-22 ENCOUNTER — Ambulatory Visit: Payer: Medicare HMO

## 2020-03-23 ENCOUNTER — Other Ambulatory Visit: Payer: Self-pay

## 2020-03-23 ENCOUNTER — Ambulatory Visit (INDEPENDENT_AMBULATORY_CARE_PROVIDER_SITE_OTHER): Payer: Medicare HMO | Admitting: *Deleted

## 2020-03-23 DIAGNOSIS — E538 Deficiency of other specified B group vitamins: Secondary | ICD-10-CM

## 2020-03-23 MED ORDER — CYANOCOBALAMIN 1000 MCG/ML IJ SOLN
1000.0000 ug | Freq: Once | INTRAMUSCULAR | Status: AC
  Administered 2020-03-23: 1000 ug via INTRAMUSCULAR

## 2020-03-23 NOTE — Progress Notes (Signed)
Patient present for B12 Inj Given to patient on Rt deltoid Patient tolerated well

## 2020-04-05 ENCOUNTER — Ambulatory Visit: Payer: Medicare HMO

## 2020-04-11 ENCOUNTER — Ambulatory Visit (INDEPENDENT_AMBULATORY_CARE_PROVIDER_SITE_OTHER): Payer: Medicare HMO

## 2020-04-11 DIAGNOSIS — E538 Deficiency of other specified B group vitamins: Secondary | ICD-10-CM

## 2020-04-11 MED ORDER — CYANOCOBALAMIN 1000 MCG/ML IJ SOLN
1000.0000 ug | Freq: Once | INTRAMUSCULAR | Status: AC
  Administered 2020-04-11: 1000 ug via INTRAMUSCULAR

## 2020-04-11 NOTE — Progress Notes (Signed)
Per orders of Dr. Jonni Sanger , injection of B-12 given by Tobe Sos in right deltoid. Patient tolerated injection well. Patient will make appointment for 2 week. [name]

## 2020-04-12 ENCOUNTER — Other Ambulatory Visit: Payer: Self-pay

## 2020-04-12 ENCOUNTER — Ambulatory Visit: Payer: Medicare HMO | Admitting: Dermatology

## 2020-04-12 ENCOUNTER — Encounter: Payer: Self-pay | Admitting: Dermatology

## 2020-04-12 DIAGNOSIS — D485 Neoplasm of uncertain behavior of skin: Secondary | ICD-10-CM | POA: Diagnosis not present

## 2020-04-12 DIAGNOSIS — L570 Actinic keratosis: Secondary | ICD-10-CM

## 2020-04-12 DIAGNOSIS — Z1283 Encounter for screening for malignant neoplasm of skin: Secondary | ICD-10-CM

## 2020-04-12 DIAGNOSIS — L820 Inflamed seborrheic keratosis: Secondary | ICD-10-CM | POA: Diagnosis not present

## 2020-04-12 NOTE — Progress Notes (Signed)
   New Patient   Subjective  [name] is a 75 y.o. female who presents for the following: Skin Problem and Annual Exam (No new concerns).  General skin examination Location: Enlarged spots on back and side, sensitive crust on leg Duration:  Quality:  Associated Signs/Symptoms: Modifying Factors:  Severity:  Timing: Context:    The following portions of the chart were reviewed this encounter and updated as appropriate:  Tobacco  Allergies  Meds  Problems  Med Hx  Surg Hx  Fam Hx      Objective  Well appearing patient in no apparent distress; mood and affect are within normal limits. Objective  Chest - Medial [facility]): General skin examination, no atypical moles or melanoma.  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       A full examination was performed including scalp, head, eyes, ears, nose, lips, neck, chest, axillae, abdomen, back, buttocks, bilateral upper extremities, bilateral lower extremities, hands, feet, fingers, toes, fingernails, and toenails. All findings within normal limits unless otherwise noted below.   Assessment & Plan  Screening exam for skin cancer Chest - Medial [facility])  Annual skin examination, encouraged to self examine twice annually.  AK (actinic keratosis) Right Lower Leg - Anterior  Destruction of lesion - Right Lower Leg - Anterior Complexity: simple   Destruction method: cryotherapy   Informed consent: discussed and consent obtained   Timeout:  patient name, date of birth, surgical site, and procedure verified Lesion destroyed using liquid nitrogen: Yes   Cryotherapy cycles:  3 Outcome: patient tolerated procedure well with no complications   Post-procedure details: wound care instructions given    Neoplasm of uncertain  behavior of skin (2) Mid Back  Skin / nail biopsy Type of biopsy: tangential   Informed consent: discussed and consent obtained   Timeout: patient name, date of birth, surgical site, and procedure verified   Anesthesia: the lesion was anesthetized in a standard fashion   Anesthetic:  1% lidocaine w/ epinephrine 1-100,000 local infiltration Instrument used: flexible razor blade   Hemostasis achieved with: aluminum chloride and electrodesiccation   Outcome: patient tolerated procedure well   Post-procedure details: wound care instructions given    Specimen 1 - Surgical pathology Differential Diagnosis: seb k  Check Margins: No  Right side  Skin / nail biopsy Type of biopsy: tangential   Informed consent: discussed and consent obtained   Timeout: patient name, date of birth, surgical site, and procedure verified   Anesthesia: the lesion was anesthetized in a standard fashion   Anesthetic:  1% lidocaine w/ epinephrine 1-100,000 local infiltration Instrument used: flexible razor blade   Hemostasis achieved with: aluminum chloride and electrodesiccation   Outcome: patient tolerated procedure well   Post-procedure details: wound care instructions given    Specimen 2 - Surgical pathology Differential Diagnosis: seb k  Check Margins: No   Skin cancer screening performed today.

## 2020-04-12 NOTE — Patient Instructions (Signed)

## 2020-04-21 ENCOUNTER — Encounter: Payer: Self-pay | Admitting: Dermatology

## 2020-04-21 DIAGNOSIS — S0502XA Injury of conjunctiva and corneal abrasion without foreign body, left eye, initial encounter: Secondary | ICD-10-CM | POA: Diagnosis not present

## 2020-04-21 NOTE — Progress Notes (Signed)
I, Lavonna Monarch, MD, have reviewed all documentation for this visit. The documentation on 04/21/20 for the exam, diagnosis, procedures, and orders are all accurate and complete.

## 2020-04-25 DIAGNOSIS — H20022 Recurrent acute iridocyclitis, left eye: Secondary | ICD-10-CM | POA: Diagnosis not present

## 2020-04-26 ENCOUNTER — Other Ambulatory Visit: Payer: Self-pay

## 2020-04-26 ENCOUNTER — Ambulatory Visit (INDEPENDENT_AMBULATORY_CARE_PROVIDER_SITE_OTHER): Payer: Medicare HMO | Admitting: *Deleted

## 2020-04-26 DIAGNOSIS — E538 Deficiency of other specified B group vitamins: Secondary | ICD-10-CM

## 2020-04-26 MED ORDER — CYANOCOBALAMIN 1000 MCG/ML IJ SOLN
1000.0000 ug | Freq: Once | INTRAMUSCULAR | Status: AC
  Administered 2020-04-26: 1000 ug via INTRAMUSCULAR

## 2020-04-26 NOTE — Progress Notes (Signed)
Per orders of Dr. Jonni Sanger , injection of B-12 given by Hildred Alamin, RN in left deltoid; tolerated injection well. Patient will make appointment for 2 week. Marland Kitchen

## 2020-05-10 ENCOUNTER — Ambulatory Visit (INDEPENDENT_AMBULATORY_CARE_PROVIDER_SITE_OTHER): Payer: Medicare HMO

## 2020-05-10 ENCOUNTER — Other Ambulatory Visit: Payer: Self-pay

## 2020-05-10 DIAGNOSIS — E538 Deficiency of other specified B group vitamins: Secondary | ICD-10-CM

## 2020-05-10 MED ORDER — CYANOCOBALAMIN 1000 MCG/ML IJ SOLN
1000.0000 ug | Freq: Once | INTRAMUSCULAR | Status: AC
  Administered 2020-05-10: 1000 ug via INTRAMUSCULAR

## 2020-05-10 NOTE — Progress Notes (Signed)
[name] 75 yr old female presents to office today for bi-weekly B12 injection per Billey Chang, MD. Patient supplied CYANOCOBALAMIN 1,000 mcg/mL IM left arm. Patient tolerated well.

## 2020-05-18 DIAGNOSIS — H10412 Chronic giant papillary conjunctivitis, left eye: Secondary | ICD-10-CM | POA: Diagnosis not present

## 2020-05-23 DIAGNOSIS — E119 Type 2 diabetes mellitus without complications: Secondary | ICD-10-CM | POA: Diagnosis not present

## 2020-05-24 ENCOUNTER — Ambulatory Visit (INDEPENDENT_AMBULATORY_CARE_PROVIDER_SITE_OTHER): Payer: Medicare HMO

## 2020-05-24 ENCOUNTER — Other Ambulatory Visit: Payer: Self-pay

## 2020-05-24 DIAGNOSIS — E538 Deficiency of other specified B group vitamins: Secondary | ICD-10-CM

## 2020-05-24 MED ORDER — CYANOCOBALAMIN 1000 MCG/ML IJ SOLN
1000.0000 ug | Freq: Once | INTRAMUSCULAR | Status: AC
  Administered 2020-05-24: 1000 ug via INTRAMUSCULAR

## 2020-05-24 NOTE — Progress Notes (Signed)
Patient given b12 by Simeon Craft. CMA ordered by Dr. Harrold Donath tolerated well

## 2020-06-07 ENCOUNTER — Other Ambulatory Visit: Payer: Self-pay

## 2020-06-07 ENCOUNTER — Ambulatory Visit (INDEPENDENT_AMBULATORY_CARE_PROVIDER_SITE_OTHER): Payer: Medicare HMO

## 2020-06-07 DIAGNOSIS — E538 Deficiency of other specified B group vitamins: Secondary | ICD-10-CM

## 2020-06-07 MED ORDER — CYANOCOBALAMIN 1000 MCG/ML IJ SOLN
1000.0000 ug | Freq: Once | INTRAMUSCULAR | Status: AC
  Administered 2020-06-07: 1000 ug via INTRAMUSCULAR

## 2020-06-07 NOTE — Progress Notes (Signed)
[name] 75 yr old female presents to office today for bi-weekly B12 injection per Billey Chang, MD given by Lattie Haw W-Patient supplied CYANOCOBALAMIN 1,000 mcg/mL IM left arm. Patient tolerated well.

## 2020-06-18 ENCOUNTER — Other Ambulatory Visit: Payer: Self-pay | Admitting: Family Medicine

## 2020-06-19 NOTE — Telephone Encounter (Signed)
  LAST APPOINTMENT DATE: 01/27/2020   NEXT APPOINTMENT DATE:@4 /08/2020  MEDICATION:levothyroxine (SYNTHROID) 125 MCG tablet  PHARMACY:[id number] - [place], [place] - [address] [place] [address]  Let patient know to contact pharmacy at the end of the day to make sure medication is ready.  Please notify patient to allow 48-72 hours to process  Encourage patient to contact the pharmacy for refills or they can request refills through [place]:   LAST REFILL:  QTY:  REFILL DATE:    OTHER COMMENTS:    Okay for refill?  Please advise

## 2020-06-20 DIAGNOSIS — Z23 Encounter for immunization: Secondary | ICD-10-CM | POA: Diagnosis not present

## 2020-06-21 ENCOUNTER — Ambulatory Visit: Payer: Medicare HMO

## 2020-06-28 ENCOUNTER — Other Ambulatory Visit: Payer: Self-pay

## 2020-06-28 ENCOUNTER — Ambulatory Visit (INDEPENDENT_AMBULATORY_CARE_PROVIDER_SITE_OTHER): Payer: Medicare HMO

## 2020-06-28 DIAGNOSIS — E538 Deficiency of other specified B group vitamins: Secondary | ICD-10-CM

## 2020-06-28 MED ORDER — CYANOCOBALAMIN 1000 MCG/ML IJ SOLN
1000.0000 ug | Freq: Once | INTRAMUSCULAR | Status: AC
Start: 2020-06-28 — End: 2020-06-28
  Administered 2020-06-28: 1000 ug via INTRAMUSCULAR

## 2020-06-28 NOTE — Progress Notes (Signed)
Per orders of Dr. Jonni Sanger , injection of B-12 given by Tobe Sos in left deltoid. Patient tolerated injection well. Patient will make appointment for 2 weeks.

## 2020-07-12 ENCOUNTER — Other Ambulatory Visit: Payer: Self-pay

## 2020-07-12 ENCOUNTER — Ambulatory Visit (INDEPENDENT_AMBULATORY_CARE_PROVIDER_SITE_OTHER): Payer: Medicare HMO | Admitting: *Deleted

## 2020-07-12 ENCOUNTER — Other Ambulatory Visit: Payer: Self-pay | Admitting: Family Medicine

## 2020-07-12 DIAGNOSIS — E538 Deficiency of other specified B group vitamins: Secondary | ICD-10-CM

## 2020-07-12 MED ORDER — CYANOCOBALAMIN 1000 MCG/ML IJ SOLN
1000.0000 ug | Freq: Once | INTRAMUSCULAR | Status: AC
  Administered 2020-07-12: 1000 ug via INTRAMUSCULAR

## 2020-07-12 NOTE — Progress Notes (Signed)
Patient presents for B12 injection today. Patient received her B12 injection in Right Deltoid. Patient tolerated injection well.  Documentation entered in [facility] in [place].

## 2020-07-24 ENCOUNTER — Telehealth: Payer: Self-pay

## 2020-07-24 NOTE — Telephone Encounter (Signed)
Please advise 

## 2020-07-24 NOTE — Telephone Encounter (Signed)
Patient called in stating she was around a friend over a week ago, who tested positive today, [name] declines any symptoms but wanted to know if she should get a covid test or if it is okay to go without.

## 2020-07-24 NOTE — Telephone Encounter (Signed)
Spoke with patient regarding testing gave verbal understanding. If she has not developed symptoms thus far, is okay to come to appointment Wednesday per [facility]

## 2020-07-24 NOTE — Telephone Encounter (Signed)
Recommend staying home as much as possible, and if develops sxs in the next 5 days, recommend testing, either here at after hours clinic or home testing or out in the community.

## 2020-07-26 ENCOUNTER — Ambulatory Visit (INDEPENDENT_AMBULATORY_CARE_PROVIDER_SITE_OTHER): Payer: Medicare HMO | Admitting: Family Medicine

## 2020-07-26 ENCOUNTER — Other Ambulatory Visit: Payer: Self-pay

## 2020-07-26 ENCOUNTER — Encounter: Payer: Self-pay | Admitting: Family Medicine

## 2020-07-26 VITALS — BP 130/90 | HR 85 | Temp 98.3°F | Ht 66.0 in | Wt 173.4 lb

## 2020-07-26 DIAGNOSIS — F339 Major depressive disorder, recurrent, unspecified: Secondary | ICD-10-CM | POA: Diagnosis not present

## 2020-07-26 DIAGNOSIS — E119 Type 2 diabetes mellitus without complications: Secondary | ICD-10-CM

## 2020-07-26 DIAGNOSIS — Z1231 Encounter for screening mammogram for malignant neoplasm of breast: Secondary | ICD-10-CM | POA: Diagnosis not present

## 2020-07-26 DIAGNOSIS — F1010 Alcohol abuse, uncomplicated: Secondary | ICD-10-CM

## 2020-07-26 DIAGNOSIS — R1010 Upper abdominal pain, unspecified: Secondary | ICD-10-CM | POA: Diagnosis not present

## 2020-07-26 DIAGNOSIS — Z780 Asymptomatic menopausal state: Secondary | ICD-10-CM

## 2020-07-26 DIAGNOSIS — E538 Deficiency of other specified B group vitamins: Secondary | ICD-10-CM

## 2020-07-26 DIAGNOSIS — I10 Essential (primary) hypertension: Secondary | ICD-10-CM | POA: Diagnosis not present

## 2020-07-26 DIAGNOSIS — E782 Mixed hyperlipidemia: Secondary | ICD-10-CM | POA: Diagnosis not present

## 2020-07-26 LAB — COMPREHENSIVE METABOLIC PANEL
ALT: 28 U/L (ref 0–35)
AST: 23 U/L (ref 0–37)
Albumin: 4.6 g/dL (ref 3.5–5.2)
Alkaline Phosphatase: 64 U/L (ref 39–117)
BUN: 13 mg/dL (ref 6–23)
CO2: 25 mEq/L (ref 19–32)
Calcium: 9.8 mg/dL (ref 8.4–10.5)
Chloride: 101 mEq/L (ref 96–112)
Creatinine, Ser: 0.84 mg/dL (ref 0.40–1.20)
GFR: 67.12 mL/min (ref >60.00)
Glucose, Bld: 157 mg/dL — ABNORMAL HIGH (ref 70–99)
Potassium: 4 mEq/L (ref 3.5–5.1)
Sodium: 136 mEq/L (ref 135–145)
Total Bilirubin: 0.9 mg/dL (ref 0.2–1.2)
Total Protein: 7.4 g/dL (ref 6.0–8.3)

## 2020-07-26 LAB — CBC WITH DIFFERENTIAL/PLATELET
Basophils Absolute: 0.1 10*3/uL (ref 0.0–0.1)
Basophils Relative: 0.8 % (ref 0.0–3.0)
Eosinophils Absolute: 0.2 10*3/uL (ref 0.0–0.7)
Eosinophils Relative: 2.1 % (ref 0.0–5.0)
HCT: 44.8 % (ref 36.0–46.0)
Hemoglobin: 15.2 g/dL — ABNORMAL HIGH (ref 12.0–15.0)
Lymphocytes Relative: 36.4 % (ref 12.0–46.0)
Lymphs Abs: 3 10*3/uL (ref 0.7–4.0)
MCHC: 34 g/dL (ref 30.0–36.0)
MCV: 91.2 fl (ref 78.0–100.0)
Monocytes Absolute: 0.6 10*3/uL (ref 0.1–1.0)
Monocytes Relative: 6.7 % (ref 3.0–12.0)
Neutro Abs: 4.4 10*3/uL (ref 1.4–7.7)
Neutrophils Relative %: 54 % (ref 43.0–77.0)
Platelets: 168 10*3/uL (ref 150.0–400.0)
RBC: 4.91 Mil/uL (ref 3.87–5.11)
RDW: 13.5 % (ref 11.5–15.5)
WBC: 8.2 10*3/uL (ref 4.0–10.5)

## 2020-07-26 LAB — POCT GLYCOSYLATED HEMOGLOBIN (HGB A1C): Hemoglobin A1C: 7.1 % — AB (ref 4.0–5.6)

## 2020-07-26 LAB — LIPASE: Lipase: 36 U/L (ref 11.0–59.0)

## 2020-07-26 MED ORDER — SHINGRIX 50 MCG/0.5ML IM SUSR: 0.5000 mL | Freq: Once | INTRAMUSCULAR | 0 refills | Status: AC

## 2020-07-26 MED ORDER — OMEPRAZOLE 20 MG PO CPDR: 20.0000 mg | DELAYED_RELEASE_CAPSULE | Freq: Every day | ORAL | 3 refills | Status: DC

## 2020-07-26 MED ORDER — CYANOCOBALAMIN 1000 MCG/ML IJ SOLN: 1000.0000 ug | Freq: Once | INTRAMUSCULAR | Status: DC

## 2020-07-26 NOTE — Patient Instructions (Addendum)
Please return in August for your annual complete physical; please come fasting.  I have ordered omeprazole 20 mg daily to help with your abdominal pain. Your diabetes is slightly worsening, try to eat well and decrease the amount of wine you drink.  This should help.  We will recheck again in 3 months. Please consider trying the Crestor at least once a week.  If you have any questions or concerns, please don't hesitate to send me a message via MyChart or call the office at [phone]. Thank you for visiting with [place] today! It's our pleasure caring for you.  Please get your 2nd dose of shingrix from the pharmacy if you haven't already had it. I have included a RX for it.   I have ordered a mammogram and/or bone density for you as we discussed today: []   Mammogram  [x]   Bone Density  Please call the office checked below to schedule your appointment for August:  [x]   [facility] [place]      [address] [place], [place]         []   [facility]  [address] [place] [address], [place]

## 2020-07-26 NOTE — Progress Notes (Signed)
Subjective  CC:  Chief Complaint  Patient presents with  . Diabetes  . Hypertension  . Hypothyroidism  . Hyperlipidemia  . Bloated    On going for about 3 weeks, sides hurt. Thinks could be gas    HPI: [name] is a 75 y.o. female who presents to the office today for follow up of diabetes and problems listed above in the chief complaint.   Diabetes follow up: Her diabetic control is reported as Worse.  She has been very stressed due to her husband and daughters recent illnesses, both requiring surgeries.  She is struggling to help them both.  She is exhausted.  She feels the stressors have worsened her sugars.  Fastings are running as high as 200.  Her diet is unchanged by report.  She continues to drink wine nightly.  She is not on diabetic medications.  She reports that if she needs to start them she would prefer long-acting insulin over pills. She denies exertional CP or SOB or symptomatic hypoglycemia. She denies foot sores or paresthesias.   Hypertension: Has been well controlled on amlodipine.  She defers changes in medications.  Elevated today due to her stress.  Home readings are normal.  Hyperlipidemia: She never started the Crestor due to fear of myalgias.  She does have osteoarthritis that limits her.  Because she has been so stressed she has not want to start any medication.  Complains of bilateral upper abdominal pain over the last several weeks that is intermittent and mild.  Some reflux symptoms.  No melena, nausea or vomiting.  No change in appetite.  No postprandial symptoms.  Major depression anxiety: She was to restart her Lexapro but never did.  She is hesitant to start medicines although she agrees that her mood is not normal.  She admits to depression and anxiety symptoms.  She uses alcohol to help with his symptoms.  She is due for vitamin B12 injection.  Recent levels are stable.  Health maintenance: Mammogram and bone density will be due in August.  Due  her second Shingrix vaccination   Wt Readings from Last 3 Encounters:  07/26/20 173 lb 6.4 oz (78.7 kg)  01/27/20 174 lb (78.9 kg)  12/14/19 174 lb 12.8 oz (79.3 kg)    BP Readings from Last 3 Encounters:  07/26/20 130/90  01/27/20 118/84  12/14/19 134/80    Assessment  1. Diet-controlled diabetes mellitus ([place])   2. Essential hypertension   3. Mixed hyperlipidemia   4. Encounter for screening mammogram for breast cancer   5. Asymptomatic menopausal state   6. Alcohol abuse, daily use   7. Major depression, recurrent, chronic ([place])   8. Vitamin B12 deficiency   9. Upper abdominal pain      Plan Is  Diabetes is currently marginally controlled.  He has been well controlled with diet alone.  Discussed diet, decreasing alcohol intake and rechecking in 3 months.  If A1c remains above 7, will institute Lantus.  Patient declines ACE inhibitor for renal protection.  Hypertension is fairly well controlled by home readings.  Stress reaction elevating it today.  Continue amlodipine daily.  Patient declines ACE inhibitor.  Hyperlipidemia: Again discussed indications for statin.  Recommend trial dose of once per week.  Patient will consider it.  She did have myalgias while taking Lipitor in the past  Alcohol daily use: Again educated on decreasing use and risks.  Upper abdominal pain: At risk for gastritis.  Start daily PPI and recheck  in 3 months.  No red flag symptoms noted.  Check lab work  B12 injection given today  Mood disorder: Patient declines starting SSRI at this time.  Will monitor  Health maintenance: Ordered mammogram and bone density to be scheduled in August.  Recommended patient get her second Shingrix vaccine at the pharmacy. Today's visit was 52 minutes long. Greater than 50% of this time was devoted to face to face counseling with the patient and coordination of care. We discussed her diagnosis, prognosis, treatment options and treatment plan is documented above.    Follow up: 3 months for complete physical, follow-up diabetes and hypertension hyperlipidemia. Orders Placed This Encounter  Procedures  . MM DIGITAL SCREENING BILATERAL  . DG Bone Density  . CBC with Differential/Platelet  . Comprehensive metabolic panel  . Lipase  . POCT HgB A1C   Meds ordered this encounter  Medications  . Zoster Vaccine Adjuvanted [facility]) injection    Sig: Inject 0.5 mLs into the muscle once for 1 dose.    Dispense:  1 each    Refill:  0    Patient had first dose in 2019; please give second if needed  . omeprazole (PRILOSEC) 20 MG capsule    Sig: Take 1 capsule (20 mg total) by mouth daily.    Dispense:  30 capsule    Refill:  3      Immunization History  Administered Date(s) Administered  . Fluad Quad(high Dose 65+) 10/30/2018, 12/08/2019  . Influenza, High Dose Seasonal PF 12/13/2013, 10/30/2018  . Influenza-Unspecified 12/16/2013  . Moderna Sars-Covid-2 Vaccination 03/30/2019, 04/30/2019  . Pneumococcal Conjugate-13 03/09/2014  . Pneumococcal Polysaccharide-23 06/24/2011  . Tdap 02/12/2006  . Zoster Recombinat (Shingrix) 07/27/2017    Diabetes Related Lab Review: Lab Results  Component Value Date   HGBA1C 7.1 (A) 07/26/2020   HGBA1C 6.3 (A) 01/27/2020   HGBA1C 6.4 (H) 05/24/2012    Lab Results  Component Value Date   MICROALBUR 0.5 11/08/2019   Lab Results  Component Value Date   CREATININE 0.84 11/08/2019   BUN 13 11/08/2019   NA 137 11/08/2019   K 4.8 11/08/2019   CL 101 11/08/2019   CO2 27 11/08/2019   Lab Results  Component Value Date   CHOL 224 (H) 11/08/2019   CHOL 221 (H) 05/25/2012   Lab Results  Component Value Date   HDL 65 11/08/2019   HDL 58 05/25/2012   Lab Results  Component Value Date   LDLCALC 137 (H) 11/08/2019   LDLCALC 138 (H) 05/25/2012   Lab Results  Component Value Date   TRIG 110 11/08/2019   TRIG 127 05/25/2012   Lab Results  Component Value Date   CHOLHDL 3.4 11/08/2019   CHOLHDL 3.8  05/25/2012   No results found for: LDLDIRECT The 10-year ASCVD risk score [name] DC Jr., et al., 2013) is: 45.3%   Values used to calculate the score:     Age: 46 years     Sex: Female     Is Non-Hispanic African American: No     Diabetic: Yes     Tobacco smoker: No     Systolic Blood Pressure: 124 mmHg     Is BP treated: Yes     HDL Cholesterol: 65 mg/dL     Total Cholesterol: 224 mg/dL I have reviewed the PMH, Fam and Soc history. Patient Active Problem List   Diagnosis Date Noted  . GAD (generalized anxiety disorder) 10/20/2019    Priority: High  . Major depression,  recurrent, chronic ([place]) 10/20/2019    Priority: High  . Diet-controlled diabetes mellitus ([place]) 10/20/2019    Priority: High  . Essential tremor 04/13/2019    Priority: High  . Acquired hypothyroidism 05/24/2012    Priority: High  . Alcohol abuse, daily use 05/24/2012    Priority: High  . Essential hypertension     Priority: High  . Mixed hyperlipidemia     Priority: High  . Osteoarthritis, multiple sites 10/20/2019    Priority: Medium    Neck, back, hands, shoulders, knees.  He has seen [facility] in the past   . Bilateral primary osteoarthritis of knee 09/23/2017    Priority: Medium  . Vitamin B12 deficiency 10/20/2019    Priority: Low  . Presbycusis of both ears 04/13/2019    Priority: Low    Social History: Patient  reports that she quit smoking about 33 years ago. Her smoking use included cigarettes. She has never used smokeless tobacco. She reports current alcohol use of about 21.0 standard drinks of alcohol per week. She reports that she does not use drugs.  Review of Systems: Ophthalmic: negative for eye pain, loss of vision or double vision Cardiovascular: negative for chest pain Respiratory: negative for SOB or persistent cough Gastrointestinal: negative for abdominal pain Genitourinary: negative for dysuria or gross hematuria MSK: negative for foot lesions Neurologic: negative for  weakness or gait disturbance  Objective  Vitals: BP 130/90   Pulse 85   Temp 98.3 F (36.8 C) (Temporal)   Ht 5\' 6"  (1.676 m)   Wt 173 lb 6.4 oz (78.7 kg)   SpO2 98%   BMI 27.99 kg/m   Psych:  Alert and oriented, anxious mood and affect HEENT:  Normocephalic, atraumatic, moist mucous membranes, supple neck  Cardiovascular:  Nl S1 and S2, RRR without murmur, gallop or rub. no edema Respiratory:  Good breath sounds bilaterally, CTAB with normal effort, no rales Gastrointestinal: normal BS, soft, nontender Skin:  Warm, no rashes Neurologic:   Mental status is normal.  Essential tremor of the head present Foot exam: no erythema, pallor, or cyanosis visible nl proprioception and sensation to monofilament testing bilaterally, +2 distal pulses bilaterally    Diabetic education: ongoing education regarding chronic disease management for diabetes was given today. We continue to reinforce the ABC's of diabetic management: A1c (<7 or 8 dependent upon patient), tight blood pressure control, and cholesterol management with goal LDL < 100 minimally. We discuss diet strategies, exercise recommendations, medication options and possible side effects. At each visit, we review recommended immunizations and preventive care recommendations for diabetics and stress that good diabetic control can prevent other problems. See below for this patient's data.    Commons side effects, risks, benefits, and alternatives for medications and treatment plan prescribed today were discussed, and the patient expressed understanding of the given instructions. Patient is instructed to call or message via MyChart if he/she has any questions or concerns regarding our treatment plan. No barriers to understanding were identified. We discussed Red Flag symptoms and signs in detail. Patient expressed understanding regarding what to do in case of urgent or emergency type symptoms.   Medication list was reconciled, printed and  provided to the patient in AVS. Patient instructions and summary information was reviewed with the patient as documented in the AVS. This note was prepared with assistance of Dragon voice recognition software. Occasional wrong-word or sound-a-like substitutions may have occurred due to the inherent limitations of voice recognition software  This visit occurred during the  SARS-CoV-2 public health emergency.  Safety protocols were in place, including screening questions prior to the visit, additional usage of staff PPE, and extensive cleaning of exam room while observing appropriate contact time as indicated for disinfecting solutions.

## 2020-07-26 NOTE — Progress Notes (Signed)
[name] administered Cyanocobalamin 1000 mcg IM into her Left deltoid muscle. The patient provided her own medication.  [place] :  [id number] LOT : [id number] EXP: 09/2022

## 2020-07-26 NOTE — Progress Notes (Signed)
Please call patient: I have reviewed his/her lab results. Lab results are all stable. No changes needed at this time. Will recheck in 3 months. Let me know if abdominal pain doesn't improve

## 2020-08-04 ENCOUNTER — Telehealth: Payer: Self-pay

## 2020-08-04 NOTE — Telephone Encounter (Signed)
Patient called in stating she was prescribed omeprazole (PRILOSEC) 20 MG capsule to help with her stomach issues, she isnt getting any relief from it. Wondering if there is something in addition to taking the Prilosec that would better benefit her or if there is another medication she can take to replace the one she is on now.

## 2020-08-04 NOTE — Telephone Encounter (Signed)
Please advise 

## 2020-08-07 NOTE — Telephone Encounter (Signed)
Please increase to 40mg  daily and give it more time to work. If not better, rec OV for recheck in 2-4 weeks. Thanks

## 2020-08-07 NOTE — Telephone Encounter (Signed)
Spoke with patient, agreed to taking 2 of the 20mg  tablets until she runs out and following up in about 3 weeks.

## 2020-08-09 ENCOUNTER — Ambulatory Visit (INDEPENDENT_AMBULATORY_CARE_PROVIDER_SITE_OTHER): Payer: Medicare HMO

## 2020-08-09 ENCOUNTER — Other Ambulatory Visit: Payer: Self-pay

## 2020-08-09 DIAGNOSIS — I10 Essential (primary) hypertension: Secondary | ICD-10-CM

## 2020-08-09 DIAGNOSIS — E538 Deficiency of other specified B group vitamins: Secondary | ICD-10-CM | POA: Diagnosis not present

## 2020-08-09 MED ORDER — CYANOCOBALAMIN 1000 MCG/ML IJ SOLN: 1000.0000 ug | Freq: Once | INTRAMUSCULAR | Status: DC

## 2020-08-09 MED ORDER — CYANOCOBALAMIN 1000 MCG/ML IJ SOLN
1000.0000 ug | Freq: Once | INTRAMUSCULAR | Status: AC
  Administered 2020-08-09: 1000 ug via INTRAMUSCULAR

## 2020-08-09 NOTE — Progress Notes (Signed)
Patient presents for B12 injection today. Patient received her B12 injection in [place] Deltoid. Patient tolerated injection well.  Documentation entered in [facility] in [place].  Patient provided her own medication for her injection.

## 2020-08-09 NOTE — Patient Instructions (Signed)
Health Maintenance Due  Topic Date Due  . COVID-19 Vaccine (3 - Moderna risk 4-dose series) 05/28/2019    Depression screen [facility] 2/9 07/26/2020 11/18/2019 10/20/2019  Decreased Interest 3 1 2   Down, Depressed, Hopeless 2 1 3   PHQ - 2 Score 5 2 5   Altered sleeping 0 1 1  Tired, decreased energy 3 1 3   Change in appetite 0 0 0  Feeling bad or failure about yourself  0 0 0  Trouble concentrating 3 1 2   Moving slowly or fidgety/restless 0 0 1  Suicidal thoughts 0 0 0  PHQ-9 Score 11 5 12   Difficult doing work/chores Somewhat difficult Somewhat difficult Very difficult    Recommended follow up: No follow-ups on file.

## 2020-08-16 ENCOUNTER — Other Ambulatory Visit: Payer: Self-pay | Admitting: Family Medicine

## 2020-08-16 DIAGNOSIS — Z780 Asymptomatic menopausal state: Secondary | ICD-10-CM

## 2020-08-16 DIAGNOSIS — Z1231 Encounter for screening mammogram for malignant neoplasm of breast: Secondary | ICD-10-CM

## 2020-08-18 ENCOUNTER — Ambulatory Visit (INDEPENDENT_AMBULATORY_CARE_PROVIDER_SITE_OTHER): Payer: Medicare HMO | Admitting: Family Medicine

## 2020-08-18 ENCOUNTER — Other Ambulatory Visit: Payer: Self-pay

## 2020-08-18 ENCOUNTER — Encounter: Payer: Self-pay | Admitting: Family Medicine

## 2020-08-18 ENCOUNTER — Ambulatory Visit (INDEPENDENT_AMBULATORY_CARE_PROVIDER_SITE_OTHER): Payer: Medicare HMO

## 2020-08-18 ENCOUNTER — Ambulatory Visit (HOSPITAL_BASED_OUTPATIENT_CLINIC_OR_DEPARTMENT_OTHER)
Admission: RE | Admit: 2020-08-18 | Discharge: 2020-08-18 | Disposition: A | Discharge status: Home / Self Care | Payer: Medicare HMO | Source: Ambulatory Visit | Attending: Family Medicine | Admitting: Family Medicine

## 2020-08-18 VITALS — BP 128/90 | HR 85 | Temp 98.3°F | Ht 66.0 in | Wt 171.0 lb

## 2020-08-18 DIAGNOSIS — M8949 Other hypertrophic osteoarthropathy, multiple sites: Secondary | ICD-10-CM | POA: Diagnosis not present

## 2020-08-18 DIAGNOSIS — R1010 Upper abdominal pain, unspecified: Secondary | ICD-10-CM | POA: Diagnosis not present

## 2020-08-18 DIAGNOSIS — F1099 Alcohol use, unspecified with unspecified alcohol-induced disorder: Secondary | ICD-10-CM | POA: Diagnosis not present

## 2020-08-18 DIAGNOSIS — K8689 Other specified diseases of pancreas: Secondary | ICD-10-CM

## 2020-08-18 DIAGNOSIS — M159 Polyosteoarthritis, unspecified: Secondary | ICD-10-CM

## 2020-08-18 DIAGNOSIS — R109 Unspecified abdominal pain: Secondary | ICD-10-CM | POA: Diagnosis not present

## 2020-08-18 DIAGNOSIS — K7689 Other specified diseases of liver: Secondary | ICD-10-CM | POA: Diagnosis not present

## 2020-08-18 DIAGNOSIS — K5790 Diverticulosis of intestine, part unspecified, without perforation or abscess without bleeding: Secondary | ICD-10-CM

## 2020-08-18 DIAGNOSIS — K769 Liver disease, unspecified: Secondary | ICD-10-CM

## 2020-08-18 DIAGNOSIS — K862 Cyst of pancreas: Secondary | ICD-10-CM | POA: Diagnosis not present

## 2020-08-18 LAB — CBC WITH DIFFERENTIAL/PLATELET
Basophils Absolute: 0 10*3/uL (ref 0.0–0.1)
Basophils Relative: 0.6 % (ref 0.0–3.0)
Eosinophils Absolute: 0.1 10*3/uL (ref 0.0–0.7)
Eosinophils Relative: 1.7 % (ref 0.0–5.0)
HCT: 45.4 % (ref 36.0–46.0)
Hemoglobin: 15.5 g/dL — ABNORMAL HIGH (ref 12.0–15.0)
Lymphocytes Relative: 33.9 % (ref 12.0–46.0)
Lymphs Abs: 2.8 10*3/uL (ref 0.7–4.0)
MCHC: 34.2 g/dL (ref 30.0–36.0)
MCV: 90.8 fl (ref 78.0–100.0)
Monocytes Absolute: 0.5 10*3/uL (ref 0.1–1.0)
Monocytes Relative: 6 % (ref 3.0–12.0)
Neutro Abs: 4.7 10*3/uL (ref 1.4–7.7)
Neutrophils Relative %: 57.8 % (ref 43.0–77.0)
Platelets: 185 10*3/uL (ref 150.0–400.0)
RBC: 5 Mil/uL (ref 3.87–5.11)
RDW: 13.4 % (ref 11.5–15.5)
WBC: 8.1 10*3/uL (ref 4.0–10.5)

## 2020-08-18 NOTE — Addendum Note (Signed)
Addended by: Billey Chang on: 08/18/2020 02:51 PM   Modules accepted: Orders

## 2020-08-18 NOTE — Progress Notes (Signed)
Please call patient: I have reviewed his/her lab results. The ultrasound shows a few abnormalities in the liver and pancreas. This could be the cause of the pain but needs an MRI to help further clarify.  I have ordered an MRI and we will call her to get it scheduled.

## 2020-08-18 NOTE — Addendum Note (Signed)
Addended by: Billey Chang on: 08/18/2020 04:36 PM   Modules accepted: Orders

## 2020-08-18 NOTE — Progress Notes (Signed)
Subjective  CC:  Chief Complaint  Patient presents with  . Abdominal Pain    Stomach pain has worsened, took Mylanta and used the bathroom 3 times this morning and feels a little better.  . Back Pain    Started about 3 days ago, has gotten better. Slept on heating pad    HPI: [name] is a 75 y.o. female who presents to the office today to address the problems listed above in the chief complaint.  75 yo with persistent upper abd pain x 6-8 weeks. First evaluated in early may; thought possible alcohol induced gastritis. Started/increased ppi. However, not improving. Reports daily bilateral upper quad pain worse after fried foods but aches all the time really. Now radiates around to back. No radicular sxs or injuries. No lower abd pain. Having some smaller stools. No blood in stool. No f/c/s. Has decreased alcohol use. Took mylanta this am which has helped. Denies melena. Has h/o diverticulosis on barium enema. No diarrhea. Formerly saw dr. Oletta Lamas.    Low back pain: Aching, no injury, mild.  No radicular symptoms.  History of OA   Assessment  1. Upper abdominal pain   2. Diverticulosis   3. Alcohol use, 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[place])   4. Primary osteoarthritis involving multiple joints      Plan   Persistent upper abdominal pain: unclear etiology. Benign abdomen but tender. Ultrasound to assess biliary tract. Labwork. Further eval pending results. May need ct scan if negative  Stop alcohol  contine ppi and myllanta prn  Back pain: likely DJD and unrelated to abd pain.   Follow up: prn  08/23/2020  Orders Placed This Encounter  Procedures  . DG Abd 1 View  . DG Chest 2 View  . US ABDOMEN LIMITED RUQ (LIVER/GB)  . CBC with Differential/Platelet  . Lipase  . Hepatic function panel  . Basic metabolic panel   No orders of the defined types were placed in this encounter.     I reviewed the patients updated PMH, FH, and SocHx.     Patient Active Problem List   Diagnosis Date Noted  . GAD (generalized anxiety disorder) 10/20/2019    Priority: High  . Major depression, recurrent, chronic ([place]) 10/20/2019    Priority: High  . Diet-controlled diabetes mellitus ([place]) 10/20/2019    Priority: High  . Essential tremor 04/13/2019    Priority: High  . Acquired hypothyroidism 05/24/2012    Priority: High  . Alcohol abuse, daily use 05/24/2012    Priority: High  . Essential hypertension     Priority: High  . Mixed hyperlipidemia     Priority: High  . Osteoarthritis, multiple sites 10/20/2019    Priority: Medium  . Bilateral primary osteoarthritis of knee 09/23/2017    Priority: Medium  . Vitamin B12 deficiency 10/20/2019    Priority: Low  . Presbycusis of both ears 04/13/2019    Priority: Low  . Alcohol use, 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[place]) 08/18/2020   Current Meds  Medication Sig  . ACCU-CHEK AVIVA PLUS test strip   . ALPRAZolam (XANAX) 0.5 MG tablet Take 1 tablet by mouth daily as needed for anxiety.  Marland Kitchen amLODipine (NORVASC) 5 MG tablet TAKE 1 TABLET EVERY DAY  . aspirin EC 81 MG tablet Take 81 mg by mouth daily.  . Blood Glucose Monitoring Suppl (ACCU-CHEK GUIDE) w/Device KIT   . cyanocobalamin (,VITAMIN B-12,) 1000 MCG/ML injection Inject 1,000 mcg into the muscle every 14 (fourteen) days.   Marland Kitchen  levothyroxine (SYNTHROID) 125 MCG tablet TAKE 1 TABLET ON AN EMPTY STOMACH IN THE MORNING  . omeprazole (PRILOSEC) 20 MG capsule Take 1 capsule (20 mg total) by mouth daily.    Allergies: Patient is allergic to penicillins. Family History: Patient family history includes Alcohol abuse in her brother; Alzheimer's disease in her mother; COPD in her father; Dementia in her mother; Diabetes in her mother; Emphysema in her brother and father; Heart attack in her daughter; Heart disease in her brother and sister. Social History:  Patient  reports that she quit smoking about 33 years ago. Her  smoking use included cigarettes. She has never used smokeless tobacco. She reports current alcohol use of about 21.0 standard drinks of alcohol per week. She reports that she does not use drugs.  Review of Systems: Constitutional: Negative for fever malaise or anorexia Cardiovascular: negative for chest pain Respiratory: negative for SOB or persistent cough Gastrointestinal: negative for abdominal pain  Objective  Vitals: BP 128/90   Pulse 85   Temp 98.3 F (36.8 C) (Temporal)   Ht _0  (1.676 m)   Wt 171 lb (77.6 kg)   SpO2 98%   BMI 27.60 kg/m  General: no acute distress , A&Ox3, nontoxic appearing HEENT: [place], conjunctiva normal, neck is supple Cardiovascular:  RRR without murmur or gallop.  Respiratory:  Good breath sounds bilaterally, CTAB with normal respiratory effort Gastrointestinal: soft, flat abdomen, normal active bowel sounds, + bilateral upper abd ttp w/o rebound or guarding. no palpable masses, no hepatosplenomegaly, no appreciated hernias Skin:  Warm, no rashes Back: paravertebral ttp, nl gait.      Commons side effects, risks, benefits, and alternatives for medications and treatment plan prescribed today were discussed, and the patient expressed understanding of the given instructions. Patient is instructed to call or message via MyChart if he/she has any questions or concerns regarding our treatment plan. No barriers to understanding were identified. We discussed Red Flag symptoms and signs in detail. Patient expressed understanding regarding what to do in case of urgent or emergency type symptoms.   Medication list was reconciled, printed and provided to the patient in AVS. Patient instructions and summary information was reviewed with the patient as documented in the AVS. This note was prepared with assistance of Dragon voice recognition software. Occasional wrong-word or sound-a-like substitutions may have occurred due to the inherent limitations of voice  recognition software  This visit occurred during the SARS-CoV-2 public health emergency.  Safety protocols were in place, including screening questions prior to the visit, additional usage of staff PPE, and extensive cleaning of exam room while observing appropriate contact time as indicated for disinfecting solutions.

## 2020-08-18 NOTE — Patient Instructions (Signed)
Please return if not improving.   I will release your lab results to you on your MyChart account with further instructions. Please reply with any questions.  We will call you to get you scheduled for your ultrasound.   If you have any questions or concerns, please don't hesitate to send me a message via MyChart or call the office at [phone]. Thank you for visiting with [place] today! It's our pleasure caring for you.

## 2020-08-21 ENCOUNTER — Telehealth: Payer: Self-pay

## 2020-08-21 ENCOUNTER — Other Ambulatory Visit: Payer: Self-pay | Admitting: Family Medicine

## 2020-08-21 DIAGNOSIS — E538 Deficiency of other specified B group vitamins: Secondary | ICD-10-CM

## 2020-08-21 LAB — BASIC METABOLIC PANEL
BUN: 15 mg/dL (ref 6–23)
CO2: 23 mEq/L (ref 19–32)
Calcium: 10.1 mg/dL (ref 8.4–10.5)
Chloride: 99 mEq/L (ref 96–112)
Creatinine, Ser: 0.92 mg/dL (ref 0.40–1.20)
GFR: 60.15 mL/min (ref >60.00)
Glucose, Bld: 149 mg/dL — ABNORMAL HIGH (ref 70–99)
Potassium: 4.3 mEq/L (ref 3.5–5.1)
Sodium: 135 mEq/L (ref 135–145)

## 2020-08-21 LAB — HEPATIC FUNCTION PANEL
ALT: 18 U/L (ref 0–35)
AST: 23 U/L (ref 0–37)
Albumin: 4.6 g/dL (ref 3.5–5.2)
Alkaline Phosphatase: 70 U/L (ref 39–117)
Bilirubin, Direct: 0.1 mg/dL (ref 0.0–0.3)
Total Bilirubin: 0.7 mg/dL (ref 0.2–1.2)
Total Protein: 7.6 g/dL (ref 6.0–8.3)

## 2020-08-21 LAB — LIPASE: Lipase: 31 U/L (ref 11.0–59.0)

## 2020-08-21 NOTE — Telephone Encounter (Signed)
Chest x-ray and abdominal x-rays were both unremarkable.

## 2020-08-21 NOTE — Telephone Encounter (Signed)
Please advise, I let patient know on Friday x-ray results were not yet noted

## 2020-08-21 NOTE — Telephone Encounter (Signed)
Spoke with patient regarding xrays, gave verbal understanding. Patient has also been scheduled for MRI appointment.

## 2020-08-21 NOTE — Telephone Encounter (Signed)
Patient called to schedule MRI and I explained our referral coordinator is working on it and then she asked about her Xray results can we call her back when we receive those results

## 2020-08-21 NOTE — Addendum Note (Signed)
Addended by: Doran Clay A on: 08/21/2020 08:10 AM   Modules accepted: Orders

## 2020-08-22 NOTE — Progress Notes (Signed)
Please call patient: I have reviewed his/her lab results. All blood test results are normal. We will see what the MRI shows.

## 2020-08-23 ENCOUNTER — Other Ambulatory Visit: Payer: Self-pay

## 2020-08-23 ENCOUNTER — Encounter: Payer: Self-pay | Admitting: *Deleted

## 2020-08-23 ENCOUNTER — Ambulatory Visit (INDEPENDENT_AMBULATORY_CARE_PROVIDER_SITE_OTHER): Payer: Medicare HMO | Admitting: *Deleted

## 2020-08-23 DIAGNOSIS — E538 Deficiency of other specified B group vitamins: Secondary | ICD-10-CM | POA: Diagnosis not present

## 2020-08-23 MED ORDER — CYANOCOBALAMIN 1000 MCG/ML IJ SOLN
1000.0000 ug | Freq: Once | INTRAMUSCULAR | Status: AC
  Administered 2020-08-23: 1000 ug via INTRAMUSCULAR

## 2020-08-23 NOTE — Progress Notes (Signed)
Per orders of Dr. Jonni Sanger , injection of Cyanocobalamin 1000 mcg/ml IM given by [facility] Fam in right deltoid. Patient tolerated injection well. Patient will make appointment for 2 weeks.  [name]

## 2020-08-24 ENCOUNTER — Other Ambulatory Visit: Payer: Self-pay

## 2020-08-24 ENCOUNTER — Ambulatory Visit (INDEPENDENT_AMBULATORY_CARE_PROVIDER_SITE_OTHER): Payer: Medicare HMO

## 2020-08-24 ENCOUNTER — Encounter: Payer: Self-pay | Admitting: Family Medicine

## 2020-08-24 ENCOUNTER — Ambulatory Visit (INDEPENDENT_AMBULATORY_CARE_PROVIDER_SITE_OTHER): Payer: Medicare HMO | Admitting: Family Medicine

## 2020-08-24 VITALS — BP 138/90 | HR 86 | Temp 98.2°F | Ht 66.0 in | Wt 170.6 lb

## 2020-08-24 DIAGNOSIS — M159 Polyosteoarthritis, unspecified: Secondary | ICD-10-CM

## 2020-08-24 DIAGNOSIS — R1010 Upper abdominal pain, unspecified: Secondary | ICD-10-CM

## 2020-08-24 DIAGNOSIS — M546 Pain in thoracic spine: Secondary | ICD-10-CM

## 2020-08-24 DIAGNOSIS — M8949 Other hypertrophic osteoarthropathy, multiple sites: Secondary | ICD-10-CM | POA: Diagnosis not present

## 2020-08-24 DIAGNOSIS — M545 Low back pain, unspecified: Secondary | ICD-10-CM | POA: Diagnosis not present

## 2020-08-24 MED ORDER — CYCLOBENZAPRINE HCL 10 MG PO TABS: 5.0000 mg | ORAL_TABLET | Freq: Every evening | ORAL | 0 refills | Status: DC | PRN

## 2020-08-24 MED ORDER — DICLOFENAC SODIUM 75 MG PO TBEC: 75.0000 mg | DELAYED_RELEASE_TABLET | Freq: Two times a day (BID) | ORAL | 0 refills | Status: DC | PRN

## 2020-08-24 NOTE — Progress Notes (Signed)
Subjective  CC:  Chief Complaint  Patient presents with   Back Pain    On going, lower back pain. More persistent now. Worse than stomach pain    HPI: [name] is a 75 y.o. female who presents to the office today to address the problems listed above in the chief complaint. Back pain- She has ongoing back pain that has progressively worsened since her last visit. She describes having a dull, achy, and constant pain on the side of her abdomen bilaterally for one month.  Lying down exacerbates her pain. Occassionally she has numbness that radiates down her leg. She has never felt this pain before. In the past she had a X-Ray that showed DDD.  It has also effected her gait-Has pain going from sitting to standing position. Denies any urinary incontience, weakness or tingling in her lower extremities bilaterlly. Pain is worse at night with lying down. No b/b dysfunction. Hasn't taken anything for the pain.  Abd pain is improving. Depends on what she eats. Has MRI ordered to f/u on liver lesions.   Assessment  1. Acute bilateral thoracic back pain   2. Upper abdominal pain   3. Primary osteoarthritis involving multiple joints      Plan  Bilateral thoracic back pain: Likely related to DJD and musculoskeletal in nature.  Had recent normal chest x-ray.  We will start treatment with Voltaren and muscle relaxer at night.  Risk and precautions discussed.  She also may use Tylenol.  Check thoracic and lumbar spine films.  No red flag symptoms present today. Abdominal pain: Awaiting MRI results.  Slightly improved.  Lab work was unremarkable.  Follow up: As scheduled 09/06/2020  Orders Placed This Encounter  Procedures   DG Thoracic Spine 2 View   DG Lumbar Spine 2-3 Views   Meds ordered this encounter  Medications   cyclobenzaprine (FLEXERIL) 10 MG tablet    Sig: Take 0.5-1 tablets (5-10 mg total) by mouth at bedtime as needed for muscle spasms.    Dispense:  30 tablet    Refill:  0    diclofenac (VOLTAREN) 75 MG EC tablet    Sig: Take 1 tablet (75 mg total) by mouth 2 (two) times daily as needed for moderate pain. With food    Dispense:  30 tablet    Refill:  0      I reviewed the patients updated PMH, FH, and SocHx.    Patient Active Problem List   Diagnosis Date Noted   GAD (generalized anxiety disorder) 10/20/2019    Priority: High   Major depression, recurrent, chronic ([place]) 10/20/2019    Priority: High   Diet-controlled diabetes mellitus ([place]) 10/20/2019    Priority: High   Essential tremor 04/13/2019    Priority: High   Acquired hypothyroidism 05/24/2012    Priority: High   Alcohol abuse, daily use 05/24/2012    Priority: High   Essential hypertension     Priority: High   Mixed hyperlipidemia     Priority: High   Osteoarthritis, multiple sites 10/20/2019    Priority: Medium   Bilateral primary osteoarthritis of knee 09/23/2017    Priority: Medium   Vitamin B12 deficiency 10/20/2019    Priority: Low   Presbycusis of both ears 04/13/2019    Priority: Low   Alcohol use, 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   amLODipine (NORVASC) 5 MG tablet TAKE 1 TABLET EVERY DAY   aspirin EC 81 MG tablet Take 81 mg by mouth daily.   Blood Glucose Monitoring Suppl (ACCU-CHEK GUIDE) w/Device KIT    cyanocobalamin (,VITAMIN B-12,) 1000 MCG/ML injection INJECT 1 ML EVERY 2 WEEKS   cyclobenzaprine (FLEXERIL) 10 MG tablet Take 0.5-1 tablets (5-10 mg total) by mouth at bedtime as needed for muscle spasms.   diclofenac (VOLTAREN) 75 MG EC tablet Take 1 tablet (75 mg total) by mouth 2 (two) times daily as needed for moderate pain. With food   levothyroxine (SYNTHROID) 125 MCG tablet TAKE 1 TABLET ON AN EMPTY STOMACH IN THE MORNING   omeprazole  (PRILOSEC) 20 MG capsule Take 1 capsule (20 mg total) by mouth daily.   rosuvastatin (CRESTOR) 5 MG tablet Take 1 tablet (5 mg total) by mouth 3 (three) times a week.    Allergies: Patient is allergic to penicillins. Family History: Patient family history includes Alcohol abuse in her brother; Alzheimer's disease in her mother; COPD in her father; Dementia in her mother; Diabetes in her mother; Emphysema in her brother and father; Heart attack in her daughter; Heart disease in her brother and sister. Social History:  Patient  reports that she quit smoking about 33 years ago. Her smoking use included cigarettes. She has never used smokeless tobacco. She reports current alcohol use of about 21.0 standard drinks of alcohol per week. She reports that she does not use drugs.  Review of Systems: Constitutional: Negative for fever malaise or anorexia Cardiovascular: negative for chest pain Respiratory: negative for SOB or persistent cough Gastrointestinal: negative for abdominal pain Back: positive : lower back pain   Objective  Vitals: BP 138/90   Pulse 86   Temp 98.2 F (36.8 C) (Temporal)   Ht _0  (1.676 m)   Wt 170 lb 9.6 oz (77.4 kg)   SpO2 98%   BMI 27.54 kg/m  General: Appears mildly uncomfortable Moves slowly to exam table Respiratory:  Good breath sounds bilaterally, CTAB with normal respiratory effort Back: She gets up slowly. Bilateral thoarcic paravertbral muscle tenderness, kyphosos present, decreased ROM throughout, Pain with flexion    Commons side effects, risks, benefits, and alternatives for medications and treatment plan prescribed today were discussed, and the patient expressed understanding of the given instructions. Patient is instructed to call or message via MyChart if he/she has any questions or concerns regarding our treatment plan. No barriers to understanding were identified. We discussed Red Flag symptoms and signs in detail. Patient expressed understanding  regarding what to do in case of urgent or emergency type symptoms.  Medication list was reconciled, printed and provided to the patient in AVS. Patient instructions and summary information was reviewed with the patient as documented in the AVS. This note was prepared with assistance of Dragon voice recognition software. Occasional wrong-word or sound-a-like substitutions may have occurred due to the inherent limitations of voice recognition software  This visit occurred during the SARS-CoV-2 public health emergency.  Safety protocols were in place, including screening questions prior to the visit, additional usage of staff PPE, and extensive cleaning of exam room while observing appropriate contact time as indicated for disinfecting solutions.    I,Alexis Bryant,acting as a Education administrator for [name], MD.,have documented all relevant documentation on the behalf of [name], MD,as directed by  [name], MD while in  the presence of [name], MD.  I, [name], MD, have reviewed all documentation for this visit. The documentation on 08/24/20 for the exam, diagnosis, procedures, and orders are all accurate and complete.

## 2020-08-24 NOTE — Patient Instructions (Signed)
Please follow up as scheduled for your next visit with me: 10/26/2020   Stop the aspirin.  You may use the diclofenac twice a day for pain. Take with food. You may add EX Tylenol if needed.  At night, you may take 1/2 tab muscle relaxer to help your muscles relax and sleep.   If you have any questions or concerns, please don't hesitate to send me a message via MyChart or call the office at [phone]. Thank you for visiting with [place] today! It's our pleasure caring for you.

## 2020-08-25 ENCOUNTER — Telehealth: Payer: Self-pay

## 2020-08-25 NOTE — Telephone Encounter (Signed)
Patient called in stating she would like to speak with Dr.Andy's nurse, when I asked what it was in regards to she states it's about her x-rays. Advised patient again that the x-ray results are not in and that Dr.Andy isnt in. [name] then states that she is going to report our office, then hung up.

## 2020-08-25 NOTE — Telephone Encounter (Signed)
Spoke with Ms Stanczak regarding her imaging results. I explained that the results can sometimes take 48 hours. It depends on how many imaging studies they radiologist have to read. She was okay and hoped that she would have her results by Monday.

## 2020-08-25 NOTE — Telephone Encounter (Signed)
Patient is calling in stating that she wants to know the results of the x-rays that were taken yesterday. Advised that Dr.Andy was not in and that the results werent in. Mrs.Sapp states she will not go the weekend without knowing.

## 2020-08-27 ENCOUNTER — Ambulatory Visit
Admission: RE | Admit: 2020-08-27 | Discharge: 2020-08-27 | Disposition: A | Discharge status: Home / Self Care | Payer: Medicare HMO | Source: Ambulatory Visit | Attending: Family Medicine | Admitting: Family Medicine

## 2020-08-27 DIAGNOSIS — K769 Liver disease, unspecified: Secondary | ICD-10-CM | POA: Diagnosis not present

## 2020-08-27 DIAGNOSIS — D492 Neoplasm of unspecified behavior of bone, soft tissue, and skin: Secondary | ICD-10-CM | POA: Diagnosis not present

## 2020-08-27 DIAGNOSIS — K449 Diaphragmatic hernia without obstruction or gangrene: Secondary | ICD-10-CM | POA: Diagnosis not present

## 2020-08-27 DIAGNOSIS — K8689 Other specified diseases of pancreas: Secondary | ICD-10-CM | POA: Diagnosis not present

## 2020-08-27 DIAGNOSIS — K862 Cyst of pancreas: Secondary | ICD-10-CM

## 2020-08-27 MED ORDER — GADOBENATE DIMEGLUMINE 529 MG/ML IV SOLN
15.0000 mL | Freq: Once | INTRAVENOUS | Status: AC | PRN
  Administered 2020-08-27: 15 mL via INTRAVENOUS

## 2020-08-28 ENCOUNTER — Telehealth: Payer: Self-pay

## 2020-08-28 NOTE — Addendum Note (Signed)
Addended by: Billey Chang on: 08/28/2020 09:42 AM   Modules accepted: Orders

## 2020-08-28 NOTE — Progress Notes (Signed)
I spoke with patient to give her MRI results. + pancreatic mass with liver lesions, omental lesions and vascular involvement. This is the source of her pain.   Discussed lumbar and thoraic xrays as well.   Urgent referral to oncology for further eval and treatment placed.

## 2020-08-28 NOTE — Progress Notes (Signed)
Patient made aware of osteoarthritic changes via telephone call.

## 2020-08-28 NOTE — Progress Notes (Signed)
Spoke with patient regarding referral we received from her PCP Dr. Jonni Sanger for abnormal MRI with concern for pancreatic cancer.  I offered patient a new medical oncology consult appointment for this Thursday 08/31/2020 at 3 pm with Dr.Yan Burr Medico and she has accepted this time.  I explained she need to arrive no later than 2:45 for registration purposed.  She is aware of our location, that [address] is available and that she can bring one person with her to the consult.  I also explained to her that right now we have the abnormal scan but no tissue biopsy to confirm what/where the cancer is and that most likely we will need to obtain this to know how to appropriately treat her.  She verbalized an understanding.

## 2020-08-28 NOTE — Telephone Encounter (Signed)
Please advise. She will be looking for MRI results today as well.

## 2020-08-28 NOTE — Progress Notes (Signed)
Patient made aware of results via telephone call.

## 2020-08-28 NOTE — Telephone Encounter (Signed)
Patient called in wanting to know if her medication regimen should still stay the same or if Dr.Andy would like to change it.

## 2020-08-28 NOTE — Telephone Encounter (Signed)
Please advise 

## 2020-08-29 NOTE — Telephone Encounter (Signed)
Spoke with patient regarding medications, gave a verbal understanding. Declined needing a pain medication at this time.

## 2020-08-30 NOTE — Progress Notes (Signed)
[facility]   Telephone:(336) [phone] Fax:([phone]) [place] Note   Patient Care Team: [name], MD as PCP - General (Family Medicine) Izora Gala, MD as Consulting Physician (Otolaryngology) Jonnie Finner, RN as Oncology Nurse Navigator Truitt Merle, MD as Consulting Physician (Oncology)  Date of Service:  08/31/2020   CHIEF COMPLAINTS/PURPOSE OF CONSULTATION:  Abnormal abdomen MRI  REFERRING PHYSICIAN:  Dr. Jonni Sanger   HISTORY OF PRESENTING ILLNESS:  [name] 75 y.o. female is a here because of possible pancreatic adenocarcinoma. The patient was referred by her PCP, Dr. Jonni Sanger. The patient presents to the clinic today accompanied by her daughter [name].   She presented to her PCP, Dr. Jonni Sanger, with upper abdominal pain. She underwent abdomen ultrasound on 08/18/20 showing: possible hypoechoic mass of proximal body of pancreas; several small hypoechoic areas within liver, largest in right lobe measures 2 cm.  She proceeded to abdomen MRI on 08/27/20 showing: pancreatic body infiltrative mass, measuring 5.9 cm; multiple hepatic lesions; right abdominal omental nodularity; vascular involvement with tumor. She has not yet undergone biopsy.   Today the patient notes they felt/feeling prior/after... She initially developed a band of pain across her lower ribcage. She brought this to medical attention with her PCP, who gave her pain medication (voltaren). She felt this was not helpful, and her pain worsened to include her back. On average, she rates her pain at a 3-5/10, with the 5 being mostly at night. She notes she does not have pain today. She is not currently taking her pain medication.  She has changed her eating habits since developing the pain/having her scan, which she feels has improved her pain since it first started. She is eating smaller portions and eating bland foods. She does note decreased appetite as well. She reports she has recently  lost weight and notes she was previously trying to. She reports low energy, which she notes is not new. She attributes this to her arthritis. She also notes some constipation issues.   She has a PMHx of....  -Pre-diabetes, worsening with a recent maximum of 200 prior to breakfast, previously controlled with diet. She has not started a medication to assist with this yet. -Hysterectomy -Breast biopsy, benign -Anxiety, for which she has Xanax that she uses sparingly -Benign tremor, runs in her family, she has been told it's not Parkinson's.    Socially... She lives with her husband, who is currently 64 years old. He is a diabetic and has rheumatoid arthritis. Daughter [name] is her only child. [name] recently had hip replacement, which has caused her to be unable to help. [name] lives about 30 minutes away from [place]. [name] became tearful discussing her mother's prognosis today. She smoked for about 10-15 years many years ago. She previously drank 2-3 glasses of wine a day. She stopped this around the time of her abdominal ultrasound. She is not aware of a family history of cancer in her parents, grandparents, etc. She notes her niece Arbie Cookey ([place] cousin) has breast cancer, with recent recurrence.    REVIEW OF SYSTEMS:    Constitutional: Denies fevers, chills or abnormal night sweats Eyes: Denies blurriness of vision, double vision or watery eyes Ears, nose, mouth, throat, and face: Denies mucositis or sore throat Respiratory: Denies cough, dyspnea or wheezes Cardiovascular: Denies palpitation, chest discomfort or lower extremity swelling Gastrointestinal:  Denies nausea, heartburn or change in bowel habits Skin: Denies abnormal skin rashes Lymphatics: Denies new lymphadenopathy or easy bruising  Neurological:Denies numbness, tingling or new weaknesses Behavioral/Psych: Mood is stable, no new changes  All other systems were reviewed with the patient and are negative.   MEDICAL HISTORY:   Past Medical History:  Diagnosis Date   Anxiety    Arthritis    Depression    Diabetes mellitus without complication ([name])    Hyperlipemia    Hypertension    Thyroid disease    TIA (transient ischemic attack)    Vitamin B12 deficiency 10/20/2019    SURGICAL HISTORY: Past Surgical History:  Procedure Laterality Date   ABDOMINAL HYSTERECTOMY     BREAST BIOPSY     TUBAL LIGATION      SOCIAL HISTORY: Social History   Socioeconomic History   Marital status: Married    Spouse name: Not on file   Number of children: Not on file   Years of education: Not on file   Highest education level: Not on file  Occupational History   Occupation: retired    Comment: Geophysicist/field seismologist   Occupation: caregiver  Tobacco Use   Smoking status: Former    Packs/day: 1.00    Years: 10.00    Pack years: 10.00    Types: Cigarettes    Quit date: 05/25/1987    Years since quitting: 33.2   Smokeless tobacco: Never  Vaping Use   Vaping Use: Never used  Substance and Sexual Activity   Alcohol use: Yes    Alcohol/week: 21.0 standard drinks    Types: 21 Glasses of wine per week    Comment: 3 glasses of wine daily, she stopped in 08/2020   Drug use: No   Sexual activity: Not Currently    Birth control/protection: Post-menopausal  Other Topics Concern   Not on file  Social History Narrative   Not on file   Social Determinants of Health   Financial Resource Strain: Low Risk    Difficulty of Paying Living Expenses: Not hard at all  Food Insecurity: No Food Insecurity   Worried About Charity fundraiser in the Last Year: Never true   Ran Out of Food in the Last Year: Never true  Transportation Needs: No Transportation Needs   Lack of Transportation (Medical): No   Lack of Transportation (Non-Medical): No  Physical Activity: Insufficiently Active   Days of Exercise per Week: 5 days   Minutes of Exercise per Session: 20 min  Stress: Stress Concern Present   Feeling of Stress : To some extent   Social Connections: Moderately Integrated   Frequency of Communication with Friends and Family: More than three times a week   Frequency of Social Gatherings with Friends and Family: Twice a week   Attends Religious Services: Never   Marine scientist or Organizations: Yes   Attends Archivist Meetings: Never   Marital Status: Married  Human resources officer Violence: Not At Risk   Fear of Current or Ex-Partner: No   Emotionally Abused: No   Physically Abused: No   Sexually Abused: No    FAMILY HISTORY: Family History  Problem Relation Age of Onset   Diabetes Mother    Alzheimer's disease Mother    Dementia Mother    COPD Father    Emphysema Father    Heart disease Sister    Alcohol abuse Brother    Heart disease Brother    Emphysema Brother    Heart attack Daughter    Cancer Other        breast cancer    ALLERGIES:  is allergic to penicillins.  MEDICATIONS:  Current Outpatient Medications  Medication Sig Dispense Refill   ACCU-CHEK AVIVA PLUS test strip      Accu-Chek Softclix Lancets lancets      ALPRAZolam (XANAX) 0.5 MG tablet Take 1 tablet by mouth daily as needed for anxiety.     amLODipine (NORVASC) 5 MG tablet TAKE 1 TABLET EVERY DAY 90 tablet 3   aspirin EC 81 MG tablet Take 81 mg by mouth daily.     Blood Glucose Monitoring Suppl (ACCU-CHEK GUIDE) w/Device KIT      cyanocobalamin (,VITAMIN B-12,) 1000 MCG/ML injection INJECT 1 ML EVERY 2 WEEKS 10 mL 3   cyclobenzaprine (FLEXERIL) 10 MG tablet Take 0.5-1 tablets (5-10 mg total) by mouth at bedtime as needed for muscle spasms. 30 tablet 0   diclofenac (VOLTAREN) 75 MG EC tablet Take 1 tablet (75 mg total) by mouth 2 (two) times daily as needed for moderate pain. With food 30 tablet 0   levothyroxine (SYNTHROID) 125 MCG tablet TAKE 1 TABLET ON AN EMPTY STOMACH IN THE MORNING 90 tablet 1   omeprazole (PRILOSEC) 20 MG capsule Take 1 capsule (20 mg total) by mouth daily. 30 capsule 3   rosuvastatin  (CRESTOR) 5 MG tablet Take 1 tablet (5 mg total) by mouth 3 (three) times a week. 60 tablet 3   No current facility-administered medications for this visit.    PHYSICAL EXAMINATION: ECOG PERFORMANCE STATUS: 2 - Symptomatic, <50% confined to bed  Vitals:   08/31/20 1451  BP: (!) 146/93  Pulse: 68  Resp: 18  Temp: 97.8 F (36.6 C)  SpO2: 98%   Filed Weights   08/31/20 1451  Weight: 170 lb 1.6 oz (77.2 kg)    GENERAL:alert, no distress and comfortable SKIN: skin color, texture, turgor are normal, no rashes or significant lesions EYES: normal, Conjunctiva are pink and non-injected, sclera clear  NECK: supple, thyroid normal size, non-tender, without nodularity LYMPH:  no palpable lymphadenopathy in the cervical, axillary  LUNGS: clear to auscultation and percussion with normal breathing effort HEART: regular rate & rhythm and no murmurs and no lower extremity edema ABDOMEN:abdomen soft, non-tender and normal bowel sounds Musculoskeletal:no cyanosis of digits and no clubbing, (+) significant arthritis NEURO: alert & oriented x 3 with fluent speech, (+) benign tremor  LABORATORY DATA:  I have reviewed the data as listed CBC Latest Ref Rng & Units 08/31/2020 08/18/2020 07/26/2020  WBC 4.0 - 10.5 K/uL 9.9 8.1 8.2  Hemoglobin 12.0 - 15.0 g/dL 15.5(H) 15.5(H) 15.2(H)  Hematocrit 36.0 - 46.0 % 45.4 45.4 44.8  Platelets 150 - 400 K/uL 188 185.0 168.0    CMP Latest Ref Rng & Units 08/18/2020 07/26/2020 11/08/2019  Glucose 70 - 99 mg/dL 149(H) 157(H) 157(H)  BUN 6 - 23 mg/dL '15 13 13  ' Creatinine 0.40 - 1.20 mg/dL 0.92 0.84 0.84  Sodium 135 - 145 mEq/L 135 136 137  Potassium 3.5 - 5.1 mEq/L 4.3 4.0 4.8  Chloride 96 - 112 mEq/L 99 101 101  CO2 19 - 32 mEq/L '23 25 27  ' Calcium 8.4 - 10.5 mg/dL 10.1 9.8 9.9  Total Protein 6.0 - 8.3 g/dL 7.6 7.4 7.0  Total Bilirubin 0.2 - 1.2 mg/dL 0.7 0.9 0.7  Alkaline Phos 39 - 117 U/L 70 64 -  AST 0 - 37 U/L '23 23 24  ' ALT 0 - 35 U/L '18 28 28      ' RADIOGRAPHIC STUDIES: I have personally reviewed the radiological images as listed  and agreed with the findings in the report. DG Chest 2 View  Result Date: 08/18/2020 CLINICAL DATA:  Upper abdominal pain. EXAM: CHEST - 2 VIEW COMPARISON:  Chest x-ray dated June 01, 2016. FINDINGS: The heart size and mediastinal contours are within normal limits. Both lungs are clear. The visualized skeletal structures are unremarkable. IMPRESSION: No active cardiopulmonary disease. Electronically Signed   By: Titus Dubin M.D.   On: 08/18/2020 14:37   DG Thoracic Spine 2 View  Result Date: 08/27/2020 CLINICAL DATA:  75 year old female with back pain. EXAM: THORACIC SPINE 2 VIEWS COMPARISON:  Chest radiograph dated 08/18/2020. FINDINGS: There is osteopenia and moderate degenerative changes. Mid to lower thoracic old appearing compression fractures with anterior wedging. There may be progression of compression fractures compared to the study of 08/18/2020 and therefore an acute on chronic compression fracture is not entirely excluded. Correlation with clinical exam and point tenderness recommended. CT may provide better evaluation if there is high clinical concern for an acute fracture. There is atherosclerotic calcification of the aorta. The soft tissues are unremarkable. IMPRESSION: Possible slight progression of compression fractures of the mid to lower thoracic spine compared to the study of 08/18/2020. Correlation with clinical exam and point tenderness recommended. Electronically Signed   By: Anner Crete M.D.   On: 08/27/2020 01:19   DG Lumbar Spine 2-3 Views  Result Date: 08/27/2020 CLINICAL DATA:  75 year old female with back pain. EXAM: LUMBAR SPINE - 2-3 VIEW COMPARISON:  Lumbar spine radiograph dated 12/30/2016. FINDINGS: Five lumbar type vertebra. There is no acute fracture or subluxation of the lumbar spine. Osteopenia and multilevel degenerative changes with disc space narrowing and endplate  irregularity and spurring. There is grade 1 L1-L2 and L2-L3 retrolisthesis, new since the prior radiograph. There is grade 1 L4-L5 anterolisthesis. Lower lumbar facet arthropathy. The visualized posterior elements appear intact. There is atherosclerotic calcification the abdominal aorta. The soft tissues are unremarkable. IMPRESSION: 1. No acute/traumatic lumbar spine pathology. 2. Osteopenia with multilevel degenerative changes as above. Electronically Signed   By: Anner Crete M.D.   On: 08/27/2020 01:34   DG Abd 1 View  Result Date: 08/18/2020 CLINICAL DATA:  Upper abdominal pain. EXAM: ABDOMEN - 1 VIEW COMPARISON:  Upper GI dated June 10, 2016. FINDINGS: The bowel gas pattern is normal. No radio-opaque calculi or other significant radiographic abnormality are seen. IMPRESSION: Negative. Electronically Signed   By: Titus Dubin M.D.   On: 08/18/2020 14:39   MR Abdomen W Wo Contrast  Result Date: 08/27/2020 CLINICAL DATA:  Left upper quadrant abdominal pain. Ultrasound demonstrating liver and pancreatic lesions. Symptoms for 1 month. EXAM: MRI ABDOMEN WITHOUT AND WITH CONTRAST TECHNIQUE: Multiplanar multisequence MR imaging of the abdomen was performed both before and after the administration of intravenous contrast. CONTRAST:  36m MULTIHANCE GADOBENATE DIMEGLUMINE 529 MG/ML IV SOLN COMPARISON:  Ultrasound 08/18/2020. FINDINGS: Lower chest: Normal heart size without pericardial or pleural effusion. Hepatobiliary: Multiple T2 hyperintense hepatic lesions which demonstrate peripheral hyperenhancement and central hypoenhancement, highly suspicious for metastatic disease. Example in the high right hepatic lobe segment 8) 1.4 cm on 21/16. Straddling segments five and 8 at 1.2 cm on 30/16. Normal gallbladder, without biliary ductal dilatation. Pancreas: An infiltrative mass centered in the pancreatic body including at on the order of 5.9 x 2.6 cm on 33/16. Demonstrates mildly heterogeneous T2  hyperintensity on 13/10 and hypoenhancement on subtracted image 33/17. Extends caudally to surround the celiac bifurcation including on 25/16 and 26/14. Abuts the splenic vein and  splenoportal confluence including on 33/14, with resultant gastroepiploic collaterals. No superimposed acute pancreatitis. Upstream atrophy involves the tail. Spleen:  Normal in size, without focal abnormality. Adrenals/Urinary Tract: Mild left adrenal thickening. Normal right adrenal gland. Bilateral renal cysts including at maximally 3.5 cm in the upper pole right kidney. No hydronephrosis. Stomach/Bowel: Tiny hiatal hernia.  Normal abdominal bowel loops. Vascular/Lymphatic: Vascular involvement by tumor as detailed above. No abdominal adenopathy. Other:  No ascites. Right sided abdominal peritoneal nodularity including at 5 mm on 57/19 and 67/19. Musculoskeletal: Convex left lumbar spine curvature. IMPRESSION: 1. Pancreatic body infiltrative mass, favoring adenocarcinoma. 2. Multiple hepatic lesions, most consistent with metastasis. 3. Right abdominal omental nodularity, highly suspicious for peritoneal metastasis. 4. Recommend multidisciplinary oncology consultation for eventual sampling of either the liver or pancreatic lesions. 5. Vascular involvement with tumor, as detailed above. These results will be called to the ordering clinician or representative by the Radiologist Assistant, and communication documented in the PACS or [employer]. Electronically Signed   By: Abigail Miyamoto M.D.   On: 08/27/2020 12:50   US Abdomen Complete  Result Date: 08/18/2020 CLINICAL DATA:  Upper abdominal pain EXAM: ABDOMEN ULTRASOUND COMPLETE COMPARISON:  None. FINDINGS: Gallbladder: No gallstones or wall thickening visualized. No sonographic Murphy sign noted by sonographer. Common bile duct: Diameter: 3 mm, normal Liver: Several hypoechoic areas are identified. Largest in the right lobe measures 2 x 1.6 x 2 cm. Increased parenchymal  echogenicity. Portal vein is patent on color Doppler imaging with normal direction of blood flow towards the liver. IVC: No abnormality visualized. Pancreas: Possible 5.1 x 2 x 6.3 cm hypoechoic mass of the proximal body. Spleen: Size and appearance within normal limits. Right Kidney: Length: 9.3 cm. Echogenicity within normal limits. Two cysts are present. No hydronephrosis visualized. Left Kidney: Length: 9.7 cm. Echogenicity within normal limits. No mass or hydronephrosis visualized. Abdominal aorta: No aneurysm visualized, noting portions are obscured by bowel gas. Other findings: None. IMPRESSION: Possible hypoechoic mass of the proximal body of the pancreas. Several small hypoechoic areas within the liver. Largest in the right lobe measures 2 cm. MRI of the abdomen with and without contrast recommended for further evaluation of these findings. Increased liver echogenicity likely reflecting steatosis. Electronically Signed   By: Macy Mis M.D.   On: 08/18/2020 16:19    ASSESSMENT & PLAN:  KRISTAN BRUMMITT is a 75 y.o. 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 Abdomen US on 08/18/20 showed a possible pancreatic mass. This prompted abdomen MRI on 08/27/20 showing: pancreatic body infiltrative mass; multiple hepatic lesions; right abdominal omental nodularity.  I personally reviewed her images and discussed the findings with patient and her daughter.  This is highly suspicious for metastatic pancreatic cancer. -I discussed the need for a tissue biopsy to confirm the diagnosis. I explained that this would be done with an ultrasound, and the sample will be taken from the liver. If it proves to be positive for pancreatic cancer metastasized to the liver, this would be stage IV cancer. This would mean the cancer is not curable but still treatable -We discussed the option of genetic testing, although  she does not have a family history of cancer.  If she does have BRCA mutations, we can potentially use PARP inhibitor  -I also discussed testing the biopsy sample by NGS for potential treatment options.  -I discussed the treatment option for metastatic  pancreatic cancer, in most cases, this involves chemotherapy of some kind. I reviewed our most common chemotherapy options, from most aggressive to less, such as FOLFIRINOX, gemcitabine and Abraxane, and single agent gemcitabine.  Due to her advanced age and medical comorbidities, I do not think she is a candidate of FOLFIRINOX, but would consider gemcitabine with or without Abraxane. -I discussed completing a chest CT to complete her staging, to ensure there is no additional metastatic disease. -She will proceed to lab today for CBC, CMP, and CA 19.9 -We will f/u following her biopsy  2. HTN, pre-DM, arthritis and hypothyroidsim  -f/u with PCP  -Her worsening hyperglycemia could be related to her metastatic pancreatic cancer  3 Social support -Her daughter [name] lives about 30 minutes away.  -She has good support from [place], who became tearful today while discussing her mother's prognosis.  -I offered to have social work reach out to them both. They are in agreement.   PLAN:  -She will proceed to lab today -Will refer her to IR for liver biopsy with [place]  -I ordered staging chest CT to be done -I will have our navigator Malachy Mood reach out to our Education officer, museum for support -f/u after the scan and biopsy    Orders Placed This Encounter  Procedures   US BIOPSY (LIVER)    Standing Status:   Future    Standing Expiration Date:   08/31/2021    Order Specific Question:   Lab orders requested (DO NOT place separate lab orders, these will be automatically ordered during procedure specimen collection):    Answer:   Surgical Pathology    Order Specific Question:   Lab orders requested (DO NOT place separate lab orders, these will be automatically  ordered during procedure specimen collection):    Answer:   Cytology - Non Pap    Order Specific Question:   Reason for Exam (SYMPTOM  OR DIAGNOSIS REQUIRED)    Answer:   confirm or rule out metastatic cancer    Order Specific Question:   Preferred location?    Answer:   [facility]   CT Chest Wo Contrast    Standing Status:   Future    Standing Expiration Date:   08/31/2021    Order Specific Question:   Preferred imaging location?    Answer:   [facility] ([place] only)    Standing Status:   Standing    Number of Occurrences:   30    Standing Expiration Date:   08/31/2021   CBC with Differential (Cancer Center Only)    Standing Status:   Standing    Number of Occurrences:   30    Standing Expiration Date:   08/31/2021   CA 19.9    Standing Status:   Standing    Number of Occurrences:   30    Standing Expiration Date:   08/31/2021    All questions were answered. The patient knows to call the clinic with any problems, questions or concerns. The total time spent in the appointment was 60 minutes.     Truitt Merle, MD 08/31/2020 4:03 PM  I, Wilburn Mylar, am acting as scribe for Truitt Merle, MD.   I have reviewed the above documentation for accuracy and completeness, and I agree with the above.

## 2020-08-31 ENCOUNTER — Inpatient Hospital Stay: Payer: Medicare HMO

## 2020-08-31 ENCOUNTER — Other Ambulatory Visit: Payer: Self-pay

## 2020-08-31 ENCOUNTER — Inpatient Hospital Stay: Payer: Medicare HMO | Attending: Hematology | Admitting: Hematology

## 2020-08-31 ENCOUNTER — Encounter: Payer: Self-pay | Admitting: Hematology

## 2020-08-31 VITALS — BP 146/93 | HR 68 | Temp 97.8°F | Resp 18 | Ht 66.0 in | Wt 170.1 lb

## 2020-08-31 DIAGNOSIS — Z79899 Other long term (current) drug therapy: Secondary | ICD-10-CM | POA: Diagnosis not present

## 2020-08-31 DIAGNOSIS — F17210 Nicotine dependence, cigarettes, uncomplicated: Secondary | ICD-10-CM | POA: Insufficient documentation

## 2020-08-31 DIAGNOSIS — M129 Arthropathy, unspecified: Secondary | ICD-10-CM | POA: Insufficient documentation

## 2020-08-31 DIAGNOSIS — Z8673 Personal history of transient ischemic attack (TIA), and cerebral infarction without residual deficits: Secondary | ICD-10-CM | POA: Insufficient documentation

## 2020-08-31 DIAGNOSIS — I10 Essential (primary) hypertension: Secondary | ICD-10-CM | POA: Diagnosis not present

## 2020-08-31 DIAGNOSIS — E538 Deficiency of other specified B group vitamins: Secondary | ICD-10-CM | POA: Insufficient documentation

## 2020-08-31 DIAGNOSIS — C251 Malignant neoplasm of body of pancreas: Secondary | ICD-10-CM

## 2020-08-31 DIAGNOSIS — M069 Rheumatoid arthritis, unspecified: Secondary | ICD-10-CM | POA: Diagnosis not present

## 2020-08-31 DIAGNOSIS — E785 Hyperlipidemia, unspecified: Secondary | ICD-10-CM | POA: Insufficient documentation

## 2020-08-31 DIAGNOSIS — C787 Secondary malignant neoplasm of liver and intrahepatic bile duct: Secondary | ICD-10-CM | POA: Insufficient documentation

## 2020-08-31 DIAGNOSIS — F0390 Unspecified dementia without behavioral disturbance: Secondary | ICD-10-CM | POA: Insufficient documentation

## 2020-08-31 DIAGNOSIS — E1165 Type 2 diabetes mellitus with hyperglycemia: Secondary | ICD-10-CM | POA: Insufficient documentation

## 2020-08-31 DIAGNOSIS — M8580 Other specified disorders of bone density and structure, unspecified site: Secondary | ICD-10-CM | POA: Insufficient documentation

## 2020-08-31 DIAGNOSIS — I700 Atherosclerosis of aorta: Secondary | ICD-10-CM | POA: Insufficient documentation

## 2020-08-31 DIAGNOSIS — R918 Other nonspecific abnormal finding of lung field: Secondary | ICD-10-CM | POA: Insufficient documentation

## 2020-08-31 DIAGNOSIS — C786 Secondary malignant neoplasm of retroperitoneum and peritoneum: Secondary | ICD-10-CM | POA: Insufficient documentation

## 2020-08-31 LAB — CBC WITH DIFFERENTIAL (CANCER CENTER ONLY)
Abs Immature Granulocytes: 0.02 10*3/uL (ref 0.00–0.07)
Basophils Absolute: 0.1 10*3/uL (ref 0.0–0.1)
Basophils Relative: 1 %
Eosinophils Absolute: 0.2 10*3/uL (ref 0.0–0.5)
Eosinophils Relative: 2 %
HCT: 45.4 % (ref 36.0–46.0)
Hemoglobin: 15.5 g/dL — ABNORMAL HIGH (ref 12.0–15.0)
Immature Granulocytes: 0 %
Lymphocytes Relative: 43 %
Lymphs Abs: 4.2 10*3/uL — ABNORMAL HIGH (ref 0.7–4.0)
MCH: 30.9 pg (ref 26.0–34.0)
MCHC: 34.1 g/dL (ref 30.0–36.0)
MCV: 90.4 fL (ref 80.0–100.0)
Monocytes Absolute: 0.6 10*3/uL (ref 0.1–1.0)
Monocytes Relative: 6 %
Neutro Abs: 4.8 10*3/uL (ref 1.7–7.7)
Neutrophils Relative %: 48 %
Platelet Count: 188 10*3/uL (ref 150–400)
RBC: 5.02 MIL/uL (ref 3.87–5.11)
RDW: 13 % (ref 11.5–15.5)
WBC Count: 9.9 10*3/uL (ref 4.0–10.5)
nRBC: 0 % (ref 0.0–0.2)

## 2020-08-31 LAB — CMP (CANCER CENTER ONLY)
ALT: 21 U/L (ref 0–44)
AST: 20 U/L (ref 15–41)
Albumin: 4.5 g/dL (ref 3.5–5.0)
Alkaline Phosphatase: 79 U/L (ref 38–126)
Anion gap: 10 (ref 5–15)
BUN: 14 mg/dL (ref 8–23)
CO2: 23 mmol/L (ref 22–32)
Calcium: 10.4 mg/dL — ABNORMAL HIGH (ref 8.9–10.3)
Chloride: 103 mmol/L (ref 98–111)
Creatinine: 1.01 mg/dL — ABNORMAL HIGH (ref 0.44–1.00)
GFR, Estimated: 57 mL/min — ABNORMAL LOW (ref >60)
Glucose, Bld: 122 mg/dL — ABNORMAL HIGH (ref 70–99)
Potassium: 4.9 mmol/L (ref 3.5–5.1)
Sodium: 136 mmol/L (ref 135–145)
Total Bilirubin: 0.8 mg/dL (ref 0.3–1.2)
Total Protein: 7.7 g/dL (ref 6.5–8.1)

## 2020-09-01 ENCOUNTER — Encounter: Payer: Self-pay | Admitting: General Practice

## 2020-09-01 LAB — CANCER ANTIGEN 19-9: CA 19-9: 4951 U/mL — ABNORMAL HIGH (ref 0–35)

## 2020-09-01 NOTE — Progress Notes (Signed)
Met with patient and her daughter [name] at her initial medical oncology consult today with Dr. Truitt Merle.  I had previously spoken with the patient on the phone.  I introduced myself and explained my role as nurse navigator.  They were given my direct contact information. The patient verbalized an understanding that additional diagnostic tests may be required to develop a treatment plan.  I escorted them to the lab at [facility] to have bloodwork done today.  She knows to expect calls from scheduling for a CT scan of the chest and a biopsy of the liver.

## 2020-09-01 NOTE — Progress Notes (Addendum)
[facility] CSW Progress Notes  Call to patient to offer support per referral from Dr Burr Medico.  No answer, left VM w my contact information and encouragement to return the call.  Also per referral, called daughter [name].  No answer, left VM as well w my contact information.   Daughter [name] called back.  "I was having kind of a melt down here."  Voiced significant distress at mother's potential diagnosis and concerns about how best to support mother.  She is recovering from recent surgery herself.  Provided emotional support and active listening, CSW team will hope to reconnect w daughter and mother at their next [facility] visit.    Edwyna Shell, LCSW Clinical Social Worker Phone:  [phone]

## 2020-09-04 ENCOUNTER — Encounter (HOSPITAL_COMMUNITY): Payer: Self-pay

## 2020-09-04 NOTE — Progress Notes (Unsigned)
       Patient Demographics  Patient Name  [name], [name] Legal Sex  Female DOB  24-Sep-1945 SSN  [id number] Address  [address]  [place] [address] Phone  [phone] (Home)  [phone] (Mobile) *Preferred*     RE: Biopsy Received: 3 days ago [name], MD  [name] for US guided liver lesion biopsy.   [name]         Previous Messages    ----- Message -----  From: [name]  Sent: 09/01/2020  11:21 AM EDT  To: [name], Ir Procedure Requests  Subject: Biopsy                                         Procedure Requested: US Biopsy (Liver)    Reason for Procedure:  confirm or rule out metastatic cancer    Provider Requesting:  Dr Burr Medico  Provider Telephone:  [phone]    Other Info:

## 2020-09-05 NOTE — Progress Notes (Signed)
At request of [facility] practice administrator I called patient's daughter [name].  I reviewed the patient's upcoming appointments and the rationale for each appointment.  I reviewed her lab results from her new patient consult and answered her questions.  Patient's daughter had mentioned that they may want to switch to a different oncologist due to the fact that she felt Dr. Burr Medico was not very compassionate because she didn't hold her hand and express sorrow when she delivered the news of her cancer.  I explained with the limitations that Covid has set on how we practice that perhaps that is why she perceived her as not compassionate.  I did tell her that was the first time ever hearing this about this provider.  She states she will speak with her mother regarding this and call me back with their decision.  I explained that we would want to bring her mother back in a few days after the liver biopsy on 6/24 to go over the results from this and her CT scan of the chest as well.  Based on these results we would discuss her treatment plan.  She verbalized an understanding.

## 2020-09-06 ENCOUNTER — Ambulatory Visit (INDEPENDENT_AMBULATORY_CARE_PROVIDER_SITE_OTHER): Payer: Medicare HMO

## 2020-09-06 ENCOUNTER — Other Ambulatory Visit: Payer: Self-pay

## 2020-09-06 DIAGNOSIS — E538 Deficiency of other specified B group vitamins: Secondary | ICD-10-CM

## 2020-09-06 MED ORDER — CYANOCOBALAMIN 1000 MCG/ML IJ SOLN
1000.0000 ug | Freq: Once | INTRAMUSCULAR | Status: AC
  Administered 2020-09-06: 1000 ug via INTRAMUSCULAR

## 2020-09-06 NOTE — Progress Notes (Signed)
[name] 75 yr old female presents to office today for weekly B12 injection per Billey Chang, MD. Administered CYANOCOBALAMIN 1,000 mcg/mL IM left arm. Patient tolerated well.

## 2020-09-06 NOTE — Progress Notes (Signed)
The proposed treatment discussed in conference is for discussion purpose only and is not a binding recommendation.  The patients have not been physically examined, or presented with their treatment options.  Therefore, final treatment plans cannot be decided.  

## 2020-09-07 ENCOUNTER — Ambulatory Visit (HOSPITAL_COMMUNITY)
Admission: RE | Admit: 2020-09-07 | Discharge: 2020-09-07 | Disposition: A | Discharge status: Home / Self Care | Payer: Medicare HMO | Source: Ambulatory Visit | Attending: Hematology | Admitting: Hematology

## 2020-09-07 ENCOUNTER — Other Ambulatory Visit: Payer: Self-pay | Admitting: Student

## 2020-09-07 DIAGNOSIS — I2510 Atherosclerotic heart disease of native coronary artery without angina pectoris: Secondary | ICD-10-CM | POA: Diagnosis not present

## 2020-09-07 DIAGNOSIS — K449 Diaphragmatic hernia without obstruction or gangrene: Secondary | ICD-10-CM | POA: Diagnosis not present

## 2020-09-07 DIAGNOSIS — C251 Malignant neoplasm of body of pancreas: Secondary | ICD-10-CM | POA: Diagnosis not present

## 2020-09-07 DIAGNOSIS — C259 Malignant neoplasm of pancreas, unspecified: Secondary | ICD-10-CM | POA: Diagnosis not present

## 2020-09-07 DIAGNOSIS — I700 Atherosclerosis of aorta: Secondary | ICD-10-CM | POA: Diagnosis not present

## 2020-09-07 NOTE — Progress Notes (Signed)
Patient's daughter calls evidently after calling the after hours nurse to ask if it was okay if her mother had wine with dinner.  I explained we advise patient's to avoid alcohol especially during treatment.  The daughter stated they did want to come back into get the results from CT and biopsy.  I have given her a follow up for Tuesday 6/28 at 11:40 to arrive by 11:20.  She wanted to know who would be in the room.  She would like to connect with SW either during or after that visit.  I also have offered Spiritual Care which she agrees.  The daughter does state that she is trying to get a second opinion at [facility] as well.  I have sent staff messages to CSW and Spiritual Care for help with this patient/daughter.

## 2020-09-08 ENCOUNTER — Encounter (HOSPITAL_COMMUNITY): Payer: Self-pay

## 2020-09-08 ENCOUNTER — Ambulatory Visit (HOSPITAL_COMMUNITY)
Admission: RE | Admit: 2020-09-08 | Discharge: 2020-09-08 | Disposition: A | Discharge status: Home / Self Care | Payer: Medicare HMO | Source: Ambulatory Visit | Attending: Hematology | Admitting: Hematology

## 2020-09-08 ENCOUNTER — Other Ambulatory Visit: Payer: Self-pay

## 2020-09-08 DIAGNOSIS — K869 Disease of pancreas, unspecified: Secondary | ICD-10-CM | POA: Diagnosis not present

## 2020-09-08 DIAGNOSIS — K7689 Other specified diseases of liver: Secondary | ICD-10-CM | POA: Diagnosis not present

## 2020-09-08 DIAGNOSIS — K760 Fatty (change of) liver, not elsewhere classified: Secondary | ICD-10-CM | POA: Diagnosis not present

## 2020-09-08 DIAGNOSIS — K8689 Other specified diseases of pancreas: Secondary | ICD-10-CM | POA: Diagnosis not present

## 2020-09-08 DIAGNOSIS — C259 Malignant neoplasm of pancreas, unspecified: Secondary | ICD-10-CM | POA: Insufficient documentation

## 2020-09-08 DIAGNOSIS — C251 Malignant neoplasm of body of pancreas: Secondary | ICD-10-CM

## 2020-09-08 DIAGNOSIS — K769 Liver disease, unspecified: Secondary | ICD-10-CM | POA: Diagnosis not present

## 2020-09-08 DIAGNOSIS — C229 Malignant neoplasm of liver, not specified as primary or secondary: Secondary | ICD-10-CM | POA: Diagnosis not present

## 2020-09-08 LAB — CBC
HCT: 47.1 % — ABNORMAL HIGH (ref 36.0–46.0)
Hemoglobin: 15.9 g/dL — ABNORMAL HIGH (ref 12.0–15.0)
MCH: 30.6 pg (ref 26.0–34.0)
MCHC: 33.8 g/dL (ref 30.0–36.0)
MCV: 90.6 fL (ref 80.0–100.0)
Platelets: 179 10*3/uL (ref 150–400)
RBC: 5.2 MIL/uL — ABNORMAL HIGH (ref 3.87–5.11)
RDW: 13 % (ref 11.5–15.5)
WBC: 9.2 10*3/uL (ref 4.0–10.5)
nRBC: 0 % (ref 0.0–0.2)

## 2020-09-08 LAB — PROTIME-INR
INR: 1 (ref 0.8–1.2)
Prothrombin Time: 12.7 seconds (ref 11.4–15.2)

## 2020-09-08 LAB — GLUCOSE, CAPILLARY: Glucose-Capillary: 99 mg/dL (ref 70–99)

## 2020-09-08 MED ORDER — SODIUM CHLORIDE 0.9 % IV SOLN: INTRAVENOUS | Status: DC

## 2020-09-08 MED ORDER — MIDAZOLAM HCL 2 MG/2ML IJ SOLN
INTRAMUSCULAR | Status: AC | PRN
  Administered 2020-09-08 (×3): 1 mg via INTRAVENOUS

## 2020-09-08 MED ORDER — FENTANYL CITRATE (PF) 100 MCG/2ML IJ SOLN
INTRAMUSCULAR | Status: AC
  Filled 2020-09-08: qty 2

## 2020-09-08 MED ORDER — MIDAZOLAM HCL 2 MG/2ML IJ SOLN
INTRAMUSCULAR | Status: AC
  Filled 2020-09-08: qty 2

## 2020-09-08 MED ORDER — FENTANYL CITRATE (PF) 100 MCG/2ML IJ SOLN
INTRAMUSCULAR | Status: AC | PRN
  Administered 2020-09-08 (×2): 50 ug via INTRAVENOUS

## 2020-09-08 MED ORDER — LIDOCAINE HCL 1 % IJ SOLN
INTRAMUSCULAR | Status: AC
  Filled 2020-09-08: qty 20

## 2020-09-08 MED ORDER — LIDOCAINE-EPINEPHRINE 1 %-1:100000 IJ SOLN
INTRAMUSCULAR | Status: AC | PRN
  Administered 2020-09-08: 10 mL

## 2020-09-08 NOTE — H&P (Signed)
Chief Complaint: Patient was seen in consultation today for an image guided biopsy of a hepatic lesion at the request of Feng,Yan  Referring Physician(s): Feng,Yan  Supervising Physician: Aletta Edouard  Patient Status: [facility] - Out-pt  History of Present Illness: [name] is a 75 y.o. female with PMH of HTN, HLD, vitamin B12 deficiency, DM, TIA, arthritis, anxiety, and an abnormal US abdomen findings who was referred to IR for a liver lesion biopsy.   Patient underwent abdomen ultrasound on 08/18/2020 due to upper abdominal pain which showed: possible hypoechoic mass of proximal body of pancreas; several small hypoechoic areas within liver, largest in right lobe measures 2 cm.   Patient was referred to hematology and oncology for further evaluation and management, establish care with Dr. Burr Medico.  Patient underwent MRI of abdomen on 08/27/2020 which showed: pancreatic body infiltrative mass, measuring 5.9 cm; multiple hepatic lesions; right abdominal omental nodularity; vascular involvement with tumor.    A liver lesion biopsy was recommended to the patient for further evaluation. After thorough discussion and shared decision making with her oncology team, patient decided to proceed with the biopsy.   IR was requested for an image guided biopsy of a hepatic lesion.   Patient laying in bed, not in acute distress. Denise headache, fever, chills, shortness of breath, cough, chest pain, abdominal pain, nausea ,vomiting, and bleeding.  Past Medical History:  Diagnosis Date   Anxiety    Arthritis    Depression    Diabetes mellitus without complication ([place])    Hyperlipemia    Hypertension    Thyroid disease    TIA (transient ischemic attack)    Vitamin B12 deficiency 10/20/2019    Past Surgical History:  Procedure Laterality Date   ABDOMINAL HYSTERECTOMY     BREAST BIOPSY     TUBAL LIGATION      Allergies: Penicillins  Medications: Prior to Admission medications    Medication Sig Start Date End Date Taking? Authorizing Provider  ALPRAZolam Duanne Moron) 0.5 MG tablet Take 1 tablet by mouth daily as needed for anxiety. 05/30/16  Yes [provider]  amLODipine (NORVASC) 5 MG tablet TAKE 1 TABLET EVERY DAY 12/08/19  Yes [name], MD  levothyroxine (SYNTHROID) 125 MCG tablet TAKE 1 TABLET ON AN EMPTY STOMACH IN THE MORNING 06/19/20  Yes [name], MD  omeprazole (PRILOSEC) 20 MG capsule Take 1 capsule (20 mg total) by mouth daily. 07/26/20  Yes [name], MD  ACCU-CHEK AVIVA PLUS test strip  08/11/19   [provider]  Accu-Chek Softclix Lancets lancets  03/30/20   [provider]  aspirin EC 81 MG tablet Take 81 mg by mouth daily.    [provider]  Blood Glucose Monitoring Suppl (ACCU-CHEK GUIDE) w/Device KIT  10/14/19   [provider]  cyanocobalamin (,VITAMIN B-12,) 1000 MCG/ML injection INJECT 1 ML EVERY 2 WEEKS 08/21/20   [name], MD  cyclobenzaprine (FLEXERIL) 10 MG tablet Take 0.5-1 tablets (5-10 mg total) by mouth at bedtime as needed for muscle spasms. 08/24/20   [name], MD  diclofenac (VOLTAREN) 75 MG EC tablet Take 1 tablet (75 mg total) by mouth 2 (two) times daily as needed for moderate pain. With food 08/24/20   [name], MD  rosuvastatin (CRESTOR) 5 MG tablet Take 1 tablet (5 mg total) by mouth 3 (three) times a week. 01/28/20   [name], MD     Family History  Problem Relation Age  of Onset   Diabetes Mother    Alzheimer's disease Mother    Dementia Mother    COPD Father    Emphysema Father    Heart disease Sister    Alcohol abuse Brother    Heart disease Brother    Emphysema Brother    Heart attack Daughter    Cancer Other        breast cancer    Social History   Socioeconomic History   Marital status: Married    Spouse name: Not on file   Number of children: Not on file   Years of education: Not on file   Highest education level: Not on file   Occupational History   Occupation: retired    Comment: Geophysicist/field seismologist   Occupation: caregiver  Tobacco Use   Smoking status: Former    Packs/day: 1.00    Years: 10.00    Pack years: 10.00    Types: Cigarettes    Quit date: 05/25/1987    Years since quitting: 33.3   Smokeless tobacco: Never  Vaping Use   Vaping Use: Never used  Substance and Sexual Activity   Alcohol use: Yes    Alcohol/week: 21.0 standard drinks    Types: 21 Glasses of wine per week    Comment: 3 glasses of wine daily, she stopped in 08/2020   Drug use: No   Sexual activity: Not Currently    Birth control/protection: Post-menopausal  Other Topics Concern   Not on file  Social History Narrative   Not on file   Social Determinants of Health   Financial Resource Strain: Low Risk    Difficulty of Paying Living Expenses: Not hard at all  Food Insecurity: No Food Insecurity   Worried About Charity fundraiser in the Last Year: Never true   Ran Out of Food in the Last Year: Never true  Transportation Needs: No Transportation Needs   Lack of Transportation (Medical): No   Lack of Transportation (Non-Medical): No  Physical Activity: Insufficiently Active   Days of Exercise per Week: 5 days   Minutes of Exercise per Session: 20 min  Stress: Stress Concern Present   Feeling of Stress : To some extent  Social Connections: Moderately Integrated   Frequency of Communication with Friends and Family: More than three times a week   Frequency of Social Gatherings with Friends and Family: Twice a week   Attends Religious Services: Never   Marine scientist or Organizations: Yes   Attends Archivist Meetings: Never   Marital Status: Married     Review of Systems: A 12 point ROS discussed and pertinent positives are indicated in the HPI above.  All other systems are negative.   Vital Signs: BP (!) 171/98   Pulse 92   Temp 98.9 F (37.2 C) (Oral)   Resp 18   SpO2 100%   Physical Exam  Vitals  and nursing note reviewed.  Constitutional:      General: He is not in acute distress.    Appearance: Normal appearance.  HENT:     Head: Normocephalic and atraumatic.     Mouth/Throat:     Mouth: Mucous membranes are moist.     Pharynx: Oropharynx is clear.  Cardiovascular:     Rate and Rhythm: Normal rate and regular rhythm.     Pulses: Normal pulses.     Heart sounds: Normal heart sounds.  Pulmonary:     Effort: Pulmonary effort is normal.  Breath sounds: Normal breath sounds. No wheezing, rhonchi or rales.  Abdominal:     General: Bowel sounds are normal. There is no distension.     Palpations: Abdomen is soft.  Skin:    General: Skin is warm and dry.  Neurological:     Mental Status: He is alert and oriented to person, place, and time.  Psychiatric:        Mood and Affect: Mood normal.        Behavior: Behavior normal.    MD Evaluation Airway: WNL Heart: WNL Abdomen: WNL Chest/ Lungs: WNL ASA  Classification: 2 Mallampati/Airway Score: One  Imaging: DG Chest 2 View  Result Date: 08/18/2020 CLINICAL DATA:  Upper abdominal pain. EXAM: CHEST - 2 VIEW COMPARISON:  Chest x-ray dated June 01, 2016. FINDINGS: The heart size and mediastinal contours are within normal limits. Both lungs are clear. The visualized skeletal structures are unremarkable. IMPRESSION: No active cardiopulmonary disease. Electronically Signed   By: Titus Dubin M.D.   On: 08/18/2020 14:37   DG Thoracic Spine 2 View  Result Date: 08/27/2020 CLINICAL DATA:  75 year old female with back pain. EXAM: THORACIC SPINE 2 VIEWS COMPARISON:  Chest radiograph dated 08/18/2020. FINDINGS: There is osteopenia and moderate degenerative changes. Mid to lower thoracic old appearing compression fractures with anterior wedging. There may be progression of compression fractures compared to the study of 08/18/2020 and therefore an acute on chronic compression fracture is not entirely excluded. Correlation with clinical  exam and point tenderness recommended. CT may provide better evaluation if there is high clinical concern for an acute fracture. There is atherosclerotic calcification of the aorta. The soft tissues are unremarkable. IMPRESSION: Possible slight progression of compression fractures of the mid to lower thoracic spine compared to the study of 08/18/2020. Correlation with clinical exam and point tenderness recommended. Electronically Signed   By: Anner Crete M.D.   On: 08/27/2020 01:19   DG Lumbar Spine 2-3 Views  Result Date: 08/27/2020 CLINICAL DATA:  75 year old female with back pain. EXAM: LUMBAR SPINE - 2-3 VIEW COMPARISON:  Lumbar spine radiograph dated 12/30/2016. FINDINGS: Five lumbar type vertebra. There is no acute fracture or subluxation of the lumbar spine. Osteopenia and multilevel degenerative changes with disc space narrowing and endplate irregularity and spurring. There is grade 1 L1-L2 and L2-L3 retrolisthesis, new since the prior radiograph. There is grade 1 L4-L5 anterolisthesis. Lower lumbar facet arthropathy. The visualized posterior elements appear intact. There is atherosclerotic calcification the abdominal aorta. The soft tissues are unremarkable. IMPRESSION: 1. No acute/traumatic lumbar spine pathology. 2. Osteopenia with multilevel degenerative changes as above. Electronically Signed   By: Anner Crete M.D.   On: 08/27/2020 01:34   DG Abd 1 View  Result Date: 08/18/2020 CLINICAL DATA:  Upper abdominal pain. EXAM: ABDOMEN - 1 VIEW COMPARISON:  Upper GI dated June 10, 2016. FINDINGS: The bowel gas pattern is normal. No radio-opaque calculi or other significant radiographic abnormality are seen. IMPRESSION: Negative. Electronically Signed   By: Titus Dubin M.D.   On: 08/18/2020 14:39   CT Chest Wo Contrast  Result Date: 09/08/2020 CLINICAL DATA:  Pancreatic cancer, staging. EXAM: CT CHEST WITHOUT CONTRAST TECHNIQUE: Multidetector CT imaging of the chest was performed  following the standard protocol without IV contrast. COMPARISON:  MR abdomen 08/27/2020. FINDINGS: Cardiovascular: Atherosclerotic calcification of the aorta, aortic valve and coronary arteries. Heart size normal. No pericardial effusion. Mediastinum/Nodes: Mediastinal lymph nodes are not enlarged by CT size criteria. Hilar regions are difficult to definitively  evaluate without IV contrast but appear grossly unremarkable. Esophagus is grossly unremarkable. Small hiatal hernia. Lungs/Pleura: There are scattered small pulmonary nodules measuring up to 5 mm in the posterior left lower lobe (7/113). No pleural fluid. Airway is unremarkable. Upper Abdomen: Mildly hypodense lesions in the liver measure up to 1.9 cm along the periphery of the right hepatic lobe. Visualized portion of the gallbladder is unremarkable. Nodular thickening of both adrenal glands. Low-attenuation lesions in the kidneys measure up to 4.4 cm on the right, characterized as cysts on 08/27/2020. Spleen is unremarkable. Pancreatic body mass, better seen and described on 08/27/2020 MR abdomen. Musculoskeletal: Degenerative changes in the spine. No worrisome lytic or sclerotic lesions. IMPRESSION: 1. Scattered small pulmonary nodules, worrisome for metastatic disease. 2. Pancreatic body mass and hepatic metastases, better seen and described on MR abdomen 08/27/2020. 3. Aortic atherosclerosis (ICD10-I70.0). Coronary artery calcification. Electronically Signed   By: Lorin Picket M.D.   On: 09/08/2020 10:32   MR Abdomen W Wo Contrast  Result Date: 08/27/2020 CLINICAL DATA:  Left upper quadrant abdominal pain. Ultrasound demonstrating liver and pancreatic lesions. Symptoms for 1 month. EXAM: MRI ABDOMEN WITHOUT AND WITH CONTRAST TECHNIQUE: Multiplanar multisequence MR imaging of the abdomen was performed both before and after the administration of intravenous contrast. CONTRAST:  45m MULTIHANCE GADOBENATE DIMEGLUMINE 529 MG/ML IV SOLN COMPARISON:   Ultrasound 08/18/2020. FINDINGS: Lower chest: Normal heart size without pericardial or pleural effusion. Hepatobiliary: Multiple T2 hyperintense hepatic lesions which demonstrate peripheral hyperenhancement and central hypoenhancement, highly suspicious for metastatic disease. Example in the high right hepatic lobe segment 8) 1.4 cm on 21/16. Straddling segments five and 8 at 1.2 cm on 30/16. Normal gallbladder, without biliary ductal dilatation. Pancreas: An infiltrative mass centered in the pancreatic body including at on the order of 5.9 x 2.6 cm on 33/16. Demonstrates mildly heterogeneous T2 hyperintensity on 13/10 and hypoenhancement on subtracted image 33/17. Extends caudally to surround the celiac bifurcation including on 25/16 and 26/14. Abuts the splenic vein and splenoportal confluence including on 33/14, with resultant gastroepiploic collaterals. No superimposed acute pancreatitis. Upstream atrophy involves the tail. Spleen:  Normal in size, without focal abnormality. Adrenals/Urinary Tract: Mild left adrenal thickening. Normal right adrenal gland. Bilateral renal cysts including at maximally 3.5 cm in the upper pole right kidney. No hydronephrosis. Stomach/Bowel: Tiny hiatal hernia.  Normal abdominal bowel loops. Vascular/Lymphatic: Vascular involvement by tumor as detailed above. No abdominal adenopathy. Other:  No ascites. Right sided abdominal peritoneal nodularity including at 5 mm on 57/19 and 67/19. Musculoskeletal: Convex left lumbar spine curvature. IMPRESSION: 1. Pancreatic body infiltrative mass, favoring adenocarcinoma. 2. Multiple hepatic lesions, most consistent with metastasis. 3. Right abdominal omental nodularity, highly suspicious for peritoneal metastasis. 4. Recommend multidisciplinary oncology consultation for eventual sampling of either the liver or pancreatic lesions. 5. Vascular involvement with tumor, as detailed above. These results will be called to the ordering clinician or  representative by the Radiologist Assistant, and communication documented in the PACS or [employer] Electronically Signed   By: KAbigail MiyamotoM.D.   On: 08/27/2020 12:50   [place]Abdomen Complete  Result Date: 08/18/2020 CLINICAL DATA:  Upper abdominal pain EXAM: ABDOMEN ULTRASOUND COMPLETE COMPARISON:  None. FINDINGS: Gallbladder: No gallstones or wall thickening visualized. No sonographic Murphy sign noted by sonographer. Common bile duct: Diameter: 3 mm, normal Liver: Several hypoechoic areas are identified. Largest in the right lobe measures 2 x 1.6 x 2 cm. Increased parenchymal echogenicity. Portal vein is patent on color Doppler imaging with  normal direction of blood flow towards the liver. IVC: No abnormality visualized. Pancreas: Possible 5.1 x 2 x 6.3 cm hypoechoic mass of the proximal body. Spleen: Size and appearance within normal limits. Right Kidney: Length: 9.3 cm. Echogenicity within normal limits. Two cysts are present. No hydronephrosis visualized. Left Kidney: Length: 9.7 cm. Echogenicity within normal limits. No mass or hydronephrosis visualized. Abdominal aorta: No aneurysm visualized, noting portions are obscured by bowel gas. Other findings: None. IMPRESSION: Possible hypoechoic mass of the proximal body of the pancreas. Several small hypoechoic areas within the liver. Largest in the right lobe measures 2 cm. MRI of the abdomen with and without contrast recommended for further evaluation of these findings. Increased liver echogenicity likely reflecting steatosis. Electronically Signed   By: Macy Mis M.D.   On: 08/18/2020 16:19    Labs:  CBC: Recent Labs    07/26/20 1036 08/18/20 1001 08/31/20 1548 09/08/20 1140  WBC 8.2 8.1 9.9 9.2  HGB 15.2* 15.5* 15.5* 15.9*  HCT 44.8 45.4 45.4 47.1*  PLT 168.0 185.0 188 179    COAGS: Recent Labs    09/08/20 1140  INR 1.0    BMP: Recent Labs    11/08/19 0827 07/26/20 1036 08/18/20 1001 08/31/20 1548  NA 137 136 135  136  K 4.8 4.0 4.3 4.9  CL 101 101 99 103  CO2 _0 GLUCOSE 157* 157* 149* 122*  BUN _1 CALCIUM 9.9 9.8 10.1 10.4*  CREATININE 0.84 0.84 0.92 1.01*  GFRNONAA  --   --   --  57*    LIVER FUNCTION TESTS: Recent Labs    11/08/19 0827 07/26/20 1036 08/18/20 1001 08/31/20 1548  BILITOT 0.7 0.9 0.7 0.8  AST _2 ALT _3 ALKPHOS  --  64 70 79  PROT 7.0 7.4 7.6 7.7  ALBUMIN  --  4.6 4.6 4.5    TUMOR MARKERS: No results for input(s): AFPTM, CEA, CA199, CHROMGRNA in the last 8760 hours.  Assessment and Plan: 75 y.o. female with  pancreatic mass and liver lesions, in need of tissue sampling for further evaluation. After thorough discussion and shared decision making with her oncology team, patient decided to proceed with the biopsy.   IR was requested for an image guided biopsy of a hepatic lesion. Patient presents to IR today for the procedure. N.p.o. since 7 am VSS CBC with polycythemia, at baseline  INR 1.0 On ASA 81 mg, last dose taken on 09/05/2020   Risks and benefits of a liver lesion biopsy was discussed with the patient and/or patient's family including, but not limited to bleeding, infection, damage to adjacent structures or low yield requiring additional tests.   All of the questions were answered and there is agreement to proceed.   Consent signed and in chart.  Thank you for this interesting consult.  I greatly enjoyed meeting [name] and look forward to participating in their care.  A copy of this report was sent to the requesting provider on this date.  Electronically Signed: Tera Mater, PA-C 09/08/2020, 12:34 PM   I spent a total of  30 Minutes   in face to face in clinical consultation, greater than 50% of which was counseling/coordinating care for liver lesion biopsy

## 2020-09-08 NOTE — Progress Notes (Addendum)
[name]   [phone] [phone]) [phone]   Clinic Follow up Note   Patient Care Team: [name], MD as PCP - General (Family Medicine) Izora Gala, MD as Consulting Physician (Otolaryngology) Jonnie Finner, RN as Oncology Nurse Navigator Truitt Merle, MD as Consulting Physician (Oncology)  Date of Service:  09/12/2020  CHIEF COMPLAINT: F/u of Pancreatic mass  SUMMARY OF ONCOLOGIC HISTORY: Oncology History Overview Note  Cancer Staging Primary pancreatic cancer with metastasis to other site [facility]) Staging form: Exocrine Pancreas, AJCC 8th Edition - Clinical stage from 09/08/2020: Stage IV (cT4, cN0, pM1) - Signed by Truitt Merle, MD on 09/12/2020 Stage prefix: Initial diagnosis Total positive nodes: 0    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 Pancreatic body infiltrative mass, favoring adenocarcinoma. 2. Multiple hepatic lesions, most consistent with metastasis. 3. Right abdominal omental nodularity, highly suspicious for peritoneal metastasis. 4. Recommend multidisciplinary oncology consultation for eventual sampling of either the liver or pancreatic lesions. 5. Vascular involvement with tumor, as detailed above.   09/07/2020 Imaging   CT chest  IMPRESSION: 1. Scattered small pulmonary nodules, worrisome for metastatic disease. 2. Pancreatic body mass and hepatic metastases, better seen and described on MR abdomen 08/27/2020. 3. Aortic atherosclerosis (ICD10-I70.0). Coronary artery calcification.   09/08/2020 Cancer Staging   Staging form: Exocrine Pancreas, AJCC 8th Edition - Clinical stage from 09/08/2020: Stage IV (cT4, cN0, pM1) - Signed by Truitt Merle, MD on 09/12/2020  Stage prefix: Initial diagnosis  Total positive nodes: 0    09/08/2020 Pathology Results   A. LIVER, RIGHT, BIOPSY:  - Adenocarcinoma.  See comment.   COMMENT:   The  morphology is consistent with a pancreatic primary.    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 She was last seen by me 08/31/20. She presents to the clinic with her daughter.   She is clinically stable, has intermittent abdominal pain and chronic back pain, not taking any pain meds  Appetite is normal, she watches what she eats due to DM, she lost 2 lbs since last visit  No other new symtpms She lvies with her husband who helps her at home   All other systems were reviewed with the patient and are negative.  MEDICAL HISTORY:  Past Medical History:  Diagnosis Date   Anxiety    Arthritis    Depression    Diabetes mellitus without complication ([place])    Hyperlipemia    Hypertension    Thyroid disease    TIA (transient ischemic attack)    Vitamin B12 deficiency 10/20/2019    SURGICAL HISTORY: Past Surgical History:  Procedure Laterality Date   ABDOMINAL HYSTERECTOMY     BREAST BIOPSY     TUBAL LIGATION      I have reviewed the social history and family history with the patient and they are unchanged from previous note.  ALLERGIES:  is allergic to penicillins.  MEDICATIONS:  Current Outpatient Medications  Medication Sig Dispense Refill   ACCU-CHEK AVIVA PLUS test strip      Accu-Chek Softclix Lancets lancets      ALPRAZolam (XANAX) 0.5 MG tablet Take 1 tablet by mouth daily as needed for anxiety.  amLODipine (NORVASC) 5 MG tablet TAKE 1 TABLET EVERY DAY 90 tablet 3   aspirin EC 81 MG tablet Take 81 mg by mouth daily.     Blood Glucose Monitoring Suppl (ACCU-CHEK GUIDE) w/Device KIT      cyanocobalamin (,VITAMIN B-12,) 1000 MCG/ML injection INJECT 1 ML EVERY 2 WEEKS 10 mL 3   cyclobenzaprine (FLEXERIL) 10 MG tablet Take 0.5-1 tablets (5-10 mg total) by mouth  at bedtime as needed for muscle spasms. 30 tablet 0   diclofenac (VOLTAREN) 75 MG EC tablet Take 1 tablet (75 mg total) by mouth 2 (two) times daily as needed for moderate pain. With food 30 tablet 0   levothyroxine (SYNTHROID) 125 MCG tablet TAKE 1 TABLET ON AN EMPTY STOMACH IN THE MORNING 90 tablet 1   omeprazole (PRILOSEC) 20 MG capsule Take 1 capsule (20 mg total) by mouth daily. 30 capsule 3   rosuvastatin (CRESTOR) 5 MG tablet Take 1 tablet (5 mg total) by mouth 3 (three) times a week. 60 tablet 3   No current facility-administered medications for this visit.    PHYSICAL EXAMINATION: ECOG PERFORMANCE STATUS: 2 - Symptomatic, <50% confined to bed  Vitals:   09/12/20 1134  BP: (!) 152/100  Pulse: 87  Resp: 17  Temp: 98.2 F (36.8 C)  SpO2: 100%   Filed Weights   09/12/20 1134  Weight: 168 lb 6.4 oz (76.4 kg)    GENERAL:alert, no distress and comfortable SKIN: skin color, texture, turgor are normal, no rashes or significant lesions EYES: normal, Conjunctiva are pink and non-injected, sclera clear Musculoskeletal:no cyanosis of digits and no clubbing  NEURO: alert & oriented x 3 with fluent speech, no focal motor/sensory deficits  LABORATORY DATA:  I have reviewed the data as listed CBC Latest Ref Rng & Units 09/08/2020 08/31/2020 08/18/2020  WBC 4.0 - 10.5 K/uL 9.2 9.9 8.1  Hemoglobin 12.0 - 15.0 g/dL 15.9(H) 15.5(H) 15.5(H)  Hematocrit 36.0 - 46.0 % 47.1(H) 45.4 45.4  Platelets 150 - 400 K/uL 179 188 185.0     CMP Latest Ref Rng & Units 08/31/2020 08/18/2020 07/26/2020  Glucose 70 - 99 mg/dL 122(H) 149(H) 157(H)  BUN 8 - 23 mg/dL _0 Creatinine 0.44 - 1.00 mg/dL 1.01(H) 0.92 0.84  Sodium 135 - 145 mmol/L 136 135 136  Potassium 3.5 - 5.1 mmol/L 4.9 4.3 4.0  Chloride 98 - 111 mmol/L 103 99 101  CO2 22 - 32 mmol/L _1 Calcium 8.9 - 10.3 mg/dL 10.4(H) 10.1 9.8  Total Protein 6.5 - 8.1 g/dL 7.7 7.6 7.4  Total Bilirubin 0.3 - 1.2 mg/dL 0.8 0.7 0.9  Alkaline Phos  38 - 126 U/L 79 70 64  AST 15 - 41 U/L _2 ALT 0 - 44 U/L _3 RADIOGRAPHIC STUDIES: I have personally reviewed the radiological images as listed and agreed with the findings in the report. No results found.   ASSESSMENT & PLAN:  [name] is a 75 y.o. female with   1. 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 Abdomen US on 08/18/20 showed a possible pancreatic mass. This prompted abdomen MRI on 08/27/20 showing: pancreatic body infiltrative mass; multiple hepatic lesions; right abdominal omental nodularity. This is highly suspicious for metastatic pancreatic cancer. -Her CT Chest form 09/07/20 showed scattered small pulmonary nodules, worrisome for metastatic disease but not definitive. I  reviewed with pt  -Her 09/08/20 Liver biopsy showed adenocarcinoma consistent with primary pancreatic cancer. I reviewed with her in detail  -Given pancreatic cancer metastasized to the liver, peritoneal and possibly lung, this would be stage IV cancer. This would mean the cancer is not curable but still treatable. Pt's daughter had questions about her prognosis and expected life expectancy, I answered the best I can  -We discussed the option of genetic testing, although she does not have a family history of cancer.  If she does have BRCA mutations, we can potentially use PARP inhibitor.  She is agreeable, referral made today. -Due to her advanced age, limited performance status, I recommend first-line chemotherapy with gemcitabine and Abraxane, will do Imcivree alone for first cycle.  --Chemotherapy consent: Side effects including but does not not limited to, fatigue, nausea, vomiting, diarrhea, hair loss, neuropathy, fluid retention, renal and kidney dysfunction, neutropenic fever, needed for blood transfusion, bleeding, were discussed with patient in great detail. She agrees to  proceed. -Goal of therapy is to prolong her life and improve her quality of life -Plan to start in the next 1 to 2 weeks.   2. HTN, pre-DM, arthritis and hypothyroidsim -f/u with PCP -Her worsening hyperglycemia could be related to her metastatic pancreatic cancer   3 Social support -Her daughter [name] lives about 30 minutes away. -She has good support from [name], who became tearful today while discussing her mother's prognosis. -I offered to have social work reach out to them both. They are in agreement. -Plan [name] will be done after my visit today     PLAN:  -CT scan and liver biopsy reviewed with patient and her daughter -Lab and gemcitabine in 1 to 2 weeks -Follow-up with second dose of chemo.  -Pt lives in [place], will transfer her care to Dr. Benay Spice at [facility] clinic, which is closer to home. -nutrition and genetic referral  No problem-specific Assessment & Plan notes found for this encounter.   Orders Placed This Encounter  Procedures   Ambulatory Referral to [facility] Nutrition    Referral Priority:   Urgent    Referral Type:   Consultation    Referral Reason:   Specialty Services Required    Number of Visits Requested:   1   Ambulatory referral to Genetics    Referral Priority:   Routine    Referral Type:   Consultation    Referral Reason:   Specialty Services Required    Number of Visits Requested:   1   All questions were answered. The patient knows to call the clinic with any problems, questions or concerns. No barriers to learning was detected. The total time spent in the appointment was 40 minutes.     Truitt Merle, MD 09/12/2020   I, Joslyn Devon, am acting as scribe for Truitt Merle, MD.   I have reviewed the above documentation for accuracy and completeness, and I agree with the above.

## 2020-09-08 NOTE — Procedures (Signed)
Interventional Radiology Procedure Note  Procedure: US Guided Biopsy of right lobe liver lesion  Complications: None  Estimated Blood Loss: < 10 mL  Findings: 18 G core biopsy of right lobe liver lesion performed under US guidance.  Three core samples obtained and sent to Pathology.  Vernie Piet T. Lorrayne Ismael, M.D Pager:  [phone]   

## 2020-09-08 NOTE — Discharge Instructions (Signed)
Urgent needs - Interventional Radiology on call MD [phone]  Wound - May remove dressing and shower tomorrow.  Keep site clean and dry.  Replace with bandaid as needed.  Do not submerge in tub or water until site healing well. If closed with glue, glue will flake off on its own.   

## 2020-09-11 LAB — SURGICAL PATHOLOGY

## 2020-09-12 ENCOUNTER — Other Ambulatory Visit: Payer: Self-pay

## 2020-09-12 ENCOUNTER — Telehealth: Payer: Self-pay | Admitting: Hematology

## 2020-09-12 ENCOUNTER — Ambulatory Visit: Payer: Medicare HMO | Admitting: Hematology

## 2020-09-12 ENCOUNTER — Encounter: Payer: Self-pay | Admitting: General Practice

## 2020-09-12 ENCOUNTER — Inpatient Hospital Stay: Payer: Medicare HMO | Admitting: Hematology

## 2020-09-12 ENCOUNTER — Encounter: Payer: Self-pay | Admitting: Hematology

## 2020-09-12 VITALS — BP 152/100 | HR 87 | Temp 98.2°F | Resp 17 | Wt 168.4 lb

## 2020-09-12 DIAGNOSIS — R918 Other nonspecific abnormal finding of lung field: Secondary | ICD-10-CM | POA: Diagnosis not present

## 2020-09-12 DIAGNOSIS — F0390 Unspecified dementia without behavioral disturbance: Secondary | ICD-10-CM | POA: Diagnosis not present

## 2020-09-12 DIAGNOSIS — E1165 Type 2 diabetes mellitus with hyperglycemia: Secondary | ICD-10-CM | POA: Diagnosis not present

## 2020-09-12 DIAGNOSIS — M129 Arthropathy, unspecified: Secondary | ICD-10-CM | POA: Diagnosis not present

## 2020-09-12 DIAGNOSIS — C259 Malignant neoplasm of pancreas, unspecified: Secondary | ICD-10-CM | POA: Insufficient documentation

## 2020-09-12 DIAGNOSIS — M069 Rheumatoid arthritis, unspecified: Secondary | ICD-10-CM | POA: Diagnosis not present

## 2020-09-12 DIAGNOSIS — C251 Malignant neoplasm of body of pancreas: Secondary | ICD-10-CM | POA: Diagnosis not present

## 2020-09-12 DIAGNOSIS — C787 Secondary malignant neoplasm of liver and intrahepatic bile duct: Secondary | ICD-10-CM | POA: Diagnosis not present

## 2020-09-12 DIAGNOSIS — F17210 Nicotine dependence, cigarettes, uncomplicated: Secondary | ICD-10-CM | POA: Diagnosis not present

## 2020-09-12 DIAGNOSIS — C786 Secondary malignant neoplasm of retroperitoneum and peritoneum: Secondary | ICD-10-CM | POA: Diagnosis not present

## 2020-09-12 MED ORDER — PROCHLORPERAZINE MALEATE 10 MG PO TABS: 10.0000 mg | ORAL_TABLET | Freq: Four times a day (QID) | ORAL | 1 refills | Status: AC | PRN

## 2020-09-12 MED ORDER — ONDANSETRON HCL 8 MG PO TABS: 8.0000 mg | ORAL_TABLET | Freq: Two times a day (BID) | ORAL | 1 refills | Status: DC | PRN

## 2020-09-12 NOTE — Progress Notes (Signed)
START ON PATHWAY REGIMEN - Pancreatic Adenocarcinoma ° ° °  A cycle is every 28 days: °    Nab-paclitaxel (protein bound)  °    Gemcitabine  ° °**Always confirm dose/schedule in your pharmacy ordering system** ° °Patient Characteristics: °Metastatic Disease, First Line, PS  ?  2, BRCA1/2 and PALB2 Mutation Absent/Unknown °Therapeutic Status: Metastatic Disease °Line of Therapy: First Line °ECOG Performance Status: 2 °BRCA1/2 Mutation Status: Awaiting Test Results °PALB2 Mutation Status: Awaiting Test Results °Intent of Therapy: °Non-Curative / Palliative Intent, Discussed with Patient °

## 2020-09-12 NOTE — Progress Notes (Signed)
I met with [name] and her daughter, [name].  I reviewed port a cath use and insertion.  I let them know that our schedulers will reach out to her regarding nutrition and genetic consult appts.  They verbalized understanding.

## 2020-09-12 NOTE — Telephone Encounter (Signed)
Scheduled appt per 6/28 sch msg. Pt aware.  

## 2020-09-12 NOTE — Progress Notes (Signed)
[place] Spiritual Care Note  Met [name] and her daughter [name] following her appointment with Dr Burr Medico per referral from [facility]. Provided introduction to [facility] support programs and Spiritual Care.  Will follow up with several action items:  --call [name] for follow up support later this week --visit [name] at her first chemo appt --add [name] to programming mailing list --mail [name] current [employer].   [place], [place], [facility] Pager [phone] Voicemail [phone]

## 2020-09-13 ENCOUNTER — Other Ambulatory Visit: Payer: Self-pay

## 2020-09-13 DIAGNOSIS — Z95828 Presence of other vascular implants and grafts: Secondary | ICD-10-CM

## 2020-09-13 DIAGNOSIS — C259 Malignant neoplasm of pancreas, unspecified: Secondary | ICD-10-CM

## 2020-09-13 MED ORDER — LIDOCAINE-PRILOCAINE 2.5-2.5 % EX CREA: 1.0000 "application " | TOPICAL_CREAM | CUTANEOUS | 2 refills | Status: DC | PRN

## 2020-09-14 ENCOUNTER — Telehealth: Payer: Self-pay

## 2020-09-14 ENCOUNTER — Other Ambulatory Visit: Payer: Self-pay

## 2020-09-14 MED ORDER — ALPRAZOLAM 0.5 MG PO TABS: 0.5000 mg | ORAL_TABLET | Freq: Every day | ORAL | 2 refills | Status: DC | PRN

## 2020-09-14 MED ORDER — LEVOTHYROXINE SODIUM 125 MCG PO TABS: ORAL_TABLET | ORAL | 1 refills | Status: DC

## 2020-09-14 MED ORDER — AMLODIPINE BESYLATE 5 MG PO TABS: 5.0000 mg | ORAL_TABLET | Freq: Every day | ORAL | 1 refills | Status: DC

## 2020-09-14 NOTE — Telephone Encounter (Signed)
Pt requesting refills for Xanax 0.5 mg. Last OV 08/24/2020. I have already sent Rx' s for Amlodipine and Levothyroxine.

## 2020-09-14 NOTE — Telephone Encounter (Signed)
MEDICATION:  ALPRAZolam (XANAX) 0.5 MG tablet  amLODipine (NORVASC) 5 MG tablet  levothyroxine (SYNTHROID) 125 MCG tablet   PHARMACY: [id number] - [place], [place] - [address] Phone:  [phone]  Fax:  [phone]      Comments:   **Let patient know to contact pharmacy at the end of the day to make sure medication is ready. **  ** Please notify patient to allow 48-72 hours to process**  **Encourage patient to contact the pharmacy for refills or they can request refills through [facility]**

## 2020-09-14 NOTE — Telephone Encounter (Signed)
Dr. Jonni Sanger, please see message and advise directions for pt on using Miralax.

## 2020-09-14 NOTE — Telephone Encounter (Signed)
Patient called in and stated that another doctor told her to take miralax as needed but she saying on the bottle you shouldn't take it longer than a week. Patient wants to make sure its ok for her to use. Please advise.

## 2020-09-14 NOTE — Telephone Encounter (Signed)
Spoke to pt told her it is okay for her to take Miralax up to twice daily for constipation. It is perfectly safe long term per Dr. Jonni Sanger. Pt verbalized understanding and asked if medications were refilled? Told pt all medications that she requested were filled today. Pt verbalized understanding.

## 2020-09-15 ENCOUNTER — Telehealth: Payer: Self-pay | Admitting: Nurse Practitioner

## 2020-09-15 NOTE — Telephone Encounter (Signed)
I spoke with Ms. Kihn regarding the appointment with Dr. Benay Spice on 09/20/2020 at 1:40 PM.  She will adjust other appointments she has that day and will be available to come in as above.  She plans to arrive at about 1:20 PM.

## 2020-09-19 ENCOUNTER — Telehealth: Payer: Self-pay

## 2020-09-19 NOTE — Telephone Encounter (Signed)
Spoke to [name] pt's daughter. She said her mother's sugars have been running high but she could not tell me what they were said I can contact pt she will be able to tell me. [name] said pt is concerned due to told to ear whatever she can due to pancreatic cancer and sugars going up. She said Dr. Jonni Sanger discussed with pt about metformin and some type of pen. Pt just wants to be able to eat and not worry about sugars. Told her I will call pt and find out what her sugars have been and then send message to Dr. Jerline Pain due to Dr. Jonni Sanger is out all week. [name] verbalized understanding.

## 2020-09-19 NOTE — Telephone Encounter (Signed)
Left message on voicemail to call office. Need to know what patients blood sugars have been running?

## 2020-09-19 NOTE — Telephone Encounter (Signed)
Patient's daughter is calling in stating that her blood sugar has been high, wanting a call back in regards to message as daughter would prefer to speak to someone clinical.

## 2020-09-20 ENCOUNTER — Ambulatory Visit: Payer: Medicare HMO

## 2020-09-20 ENCOUNTER — Ambulatory Visit (INDEPENDENT_AMBULATORY_CARE_PROVIDER_SITE_OTHER): Payer: Medicare HMO | Admitting: *Deleted

## 2020-09-20 ENCOUNTER — Inpatient Hospital Stay (HOSPITAL_BASED_OUTPATIENT_CLINIC_OR_DEPARTMENT_OTHER): Payer: Medicare HMO | Admitting: Oncology

## 2020-09-20 ENCOUNTER — Inpatient Hospital Stay: Payer: Medicare HMO | Attending: Hematology | Admitting: Dietician

## 2020-09-20 ENCOUNTER — Telehealth: Payer: Self-pay | Admitting: Dietician

## 2020-09-20 ENCOUNTER — Other Ambulatory Visit: Payer: Self-pay

## 2020-09-20 VITALS — BP 141/87 | HR 100 | Temp 97.9°F | Resp 20 | Ht 66.0 in | Wt 166.8 lb

## 2020-09-20 DIAGNOSIS — C259 Malignant neoplasm of pancreas, unspecified: Secondary | ICD-10-CM

## 2020-09-20 DIAGNOSIS — Z87891 Personal history of nicotine dependence: Secondary | ICD-10-CM | POA: Diagnosis not present

## 2020-09-20 DIAGNOSIS — E538 Deficiency of other specified B group vitamins: Secondary | ICD-10-CM

## 2020-09-20 DIAGNOSIS — E119 Type 2 diabetes mellitus without complications: Secondary | ICD-10-CM | POA: Diagnosis not present

## 2020-09-20 DIAGNOSIS — E1122 Type 2 diabetes mellitus with diabetic chronic kidney disease: Secondary | ICD-10-CM | POA: Diagnosis not present

## 2020-09-20 DIAGNOSIS — E079 Disorder of thyroid, unspecified: Secondary | ICD-10-CM | POA: Diagnosis not present

## 2020-09-20 DIAGNOSIS — I10 Essential (primary) hypertension: Secondary | ICD-10-CM | POA: Diagnosis not present

## 2020-09-20 DIAGNOSIS — C250 Malignant neoplasm of head of pancreas: Secondary | ICD-10-CM | POA: Diagnosis not present

## 2020-09-20 DIAGNOSIS — M1990 Unspecified osteoarthritis, unspecified site: Secondary | ICD-10-CM | POA: Insufficient documentation

## 2020-09-20 DIAGNOSIS — E785 Hyperlipidemia, unspecified: Secondary | ICD-10-CM | POA: Diagnosis not present

## 2020-09-20 DIAGNOSIS — C787 Secondary malignant neoplasm of liver and intrahepatic bile duct: Secondary | ICD-10-CM | POA: Insufficient documentation

## 2020-09-20 DIAGNOSIS — Z5111 Encounter for antineoplastic chemotherapy: Secondary | ICD-10-CM | POA: Insufficient documentation

## 2020-09-20 DIAGNOSIS — C251 Malignant neoplasm of body of pancreas: Secondary | ICD-10-CM | POA: Diagnosis present

## 2020-09-20 MED ORDER — CYANOCOBALAMIN 1000 MCG/ML IJ SOLN
1000.0000 ug | Freq: Once | INTRAMUSCULAR | Status: AC
Start: 2020-09-20 — End: 2020-09-20
  Administered 2020-09-20: 1000 ug via INTRAMUSCULAR

## 2020-09-20 MED ORDER — HYDROCODONE-ACETAMINOPHEN 5-325 MG PO TABS: 0.5000 | ORAL_TABLET | Freq: Four times a day (QID) | ORAL | 0 refills | Status: DC | PRN

## 2020-09-20 NOTE — Progress Notes (Signed)
Patient presents for B12 injection today. Patient received her B12 injection in Right Deltoid. Patient tolerated injection well.  Documentation entered in [facility] in [place].

## 2020-09-20 NOTE — Telephone Encounter (Signed)
Spoke to pt told her I had a provider review her chart and message and she said levels are not urgent and for you to make an appt with Dr. Jonni Sanger next week. Pt verbalized understanding and said the cancer doctor told her not to worry about her sugars now. Pt said she wants to wait. Told pt okay can discuss at appt in August, but continue to monitor sugars if they get 200 or higher please call. Pt verbalized understanding.

## 2020-09-20 NOTE — Telephone Encounter (Signed)
Pt presented to the office today for her B12 injection and Junious Dresser asked her what her sugars have been running? She told Stella 165 fasting in the am and 140 in the afternoon. Please see other message about sugars and advise.

## 2020-09-20 NOTE — Telephone Encounter (Signed)
Nutrition Assessment   Reason for Assessment: Provider request   ASSESSMENT: 75 year old female with pancreatic cancer metastatic to liver, peritoneal, and possibly lung. She is pending start of gemcitabine/abraxane. Patient is followed by Dr. Benay Spice  Past medical history of DM2, HLD, HTN, TIA, thyroid disease, B12 deficiency  Spoke with patient via telephone. She reports doing alright today, having gas and constipation. She is taking Miralax, says she normally has 2 unformed soft bowel movements daily. Patient does not feel these are complete and is having increased bloating/gas. Patient asking if she can take Gas-X for this. Patient reports previously managing diabetes with low carbohydrate diet. Patient reports unable to tolerate much meat and bland starchy foods are what taste best to her. She had appointment with PCP this morning who is recommending medication to aid with elevated blood sugars. Patient reports eating grits, rice, mac/cheese, potatoes, broiled fish without seasonings, and plain grilled chicken. Patient reports drinking 3 (23.7 oz) bottles of water daily.    Medications: xanax, B12, Prilosec, Miralax, Zofran, Compazine   Labs: 5/11 HgbA1c 7.1   Anthropometrics: Weights have decreased 6 lbs (3.4%) in 6 weeks which is insignificant for time frame  Height: 5'6" Weight: 168 lb 6.4 oz (6/28) UBW: 174 lb (Aug 2021-May 2022)  BMI: 27.18    NUTRITION DIAGNOSIS: Unintentional weight loss related to cancer as evidenced by patient reported decreased oral intake, constipation, and 3.4% decrease in weight in 6 weeks    INTERVENTION:  Educated on small frequent meals and snacks, will mail handout Discussed foods with protein, educated on having protein source with all meals/snacks Encouraged soft, moist high protein foods, will mail handout Discussed strategies for constipation (having warm beverage, allowing ample time to have BM, foods to limit/include, walking after  meals) will mail handout Discussed tips for managing gas and foods, will mail handout Educated patient on lactose free milk products and reduced lactose cheeses Patient will discuss Gas-x with doctor today Patient given contact information    MONITORING, EVALUATION, GOAL: Patient will tolerate adequate calorie and protein energy intake to minimize weight loss   Next Visit: via telephone ~4 weeks

## 2020-09-20 NOTE — Progress Notes (Signed)
[name] OFFICE PROGRESS NOTE  Diagnosis: Pancreas cancer  INTERVAL HISTORY:   [name] is referred by Dr. Burr Medico for treatment of pancreas cancer.  [name] [place] closer to the [name]. She reports a 70-month history of pain in the lower back and upper lateral abdomen/lower chest wall.  She saw Dr. Jonni Sanger.  An abdominal ultrasound on 08/18/2020 revealed a possible hypoechoic mass in the body the pancreas.  Several hypoechoic areas were noted in the liver.  She underwent an MRI of the abdomen on 08/27/2020.  This revealed multiple hyperintense hepatic lesions suspicious for metastatic disease.  A mass was noted in the pancreas body measuring 5.9 x 2.6 cm.  The mass extends to the celiac bifurcation and abuts the splenic ends splenoportal confluence.  No adenopathy.  No ascites.  Right-sided peritoneal nodularity was noted.  She was referred to Dr. Burr Medico.  An ultrasound-guided biopsy of a right liver lesion on 09/08/2020 revealed adenocarcinoma consistent with a pancreas primary.  [name] discussed treatment options with Dr. Burr Medico.  Gemcitabine/Abraxane was recommended.  She lives closer to the [name].  She is referred to continue oncology care here.  Past medical history: "Prediabetes " G1, P1 Hypertension Thyroid disease B12 deficiency Hyperlipidemia Osteoarthritis  Past surgical history: Tubal ligation Hysterectomy Breast biopsy  Family history: No family history of cancer  Social history: She lives with her husband in [place].  She is a retired Database administrator.  She quit smoking cigarettes 30 years ago.  She has a few glasses of wine with dinner each night.  No risk factor for HIV or hepatitis.  She has received 4 COVID vaccines  Review of systems: Positives-15 pound weight loss (she does not have anorexia, she relates weight loss to a low carbohydrate diet), pain in the lower back and upper lateral abdomen/lower  chest, constipation  A complete review of systems was otherwise negative  Objective:  Vital signs in last 24 hours:  Blood pressure (!) 141/87, pulse 100, temperature 97.9 F (36.6 C), temperature source Oral, resp. rate 20, height 5\' 6"  (1.676 m), weight 166 lb 12.8 oz (75.7 kg), SpO2 100 %.   Lymphatics: No cervical, supraclavicular, axillary, or inguinal nodes Resp: Lungs clear bilaterally Cardio: Regular rate and rhythm GI: No mass, nontender, no apparent ascites, no hepatosplenomegaly Vascular: No leg edema Neuro: Alert and oriented Musculoskeletal: No spine tenderness   Lab Results:  Lab Results  Component Value Date   WBC 9.2 09/08/2020   HGB 15.9 (H) 09/08/2020   HCT 47.1 (H) 09/08/2020   MCV 90.6 09/08/2020   PLT 179 09/08/2020   NEUTROABS 4.8 08/31/2020    CMP  Lab Results  Component Value Date   NA 136 08/31/2020   K 4.9 08/31/2020   CL 103 08/31/2020   CO2 23 08/31/2020   GLUCOSE 122 (H) 08/31/2020   BUN 14 08/31/2020   CREATININE 1.01 (H) 08/31/2020   CALCIUM 10.4 (H) 08/31/2020   PROT 7.7 08/31/2020   ALBUMIN 4.5 08/31/2020   AST 20 08/31/2020   ALT 21 08/31/2020   ALKPHOS 79 08/31/2020   BILITOT 0.8 08/31/2020   GFRNONAA 57 (L) 08/31/2020   GFRAA 71 02/02/2019   08/31/2020: CA 19-9 4951   Imaging: As per HPI, MRI 08/27/2020-images reviewed  Medications: I have reviewed the patient's current medications.   Disposition: Adenocarcinoma the pancreas body, stage IV (cT4,cN0,pM1) Ultrasound abdomen 08/18/2020-possible hypoechoic pancreas body mass, small hypoechoic liver areas MRI abdomen 08/27/2020-pancreas body  mass, multiple hepatic lesions consistent with metastases, right abdominal omental nodularity, pancreas mass extends to the celiac bifurcation and abuts the splenic vein and splenoportal confluence CT chest 09/07/2020-scattered pulmonary nodules concerning for metastases, pancreas body mass, hepatic metastases Ultrasound-guided biopsy of a  right liver lesion 09/08/2020-adenocarcinoma consistent with a pancreas primary  2.  Pain secondary #1 3.  Diabetes 4.  Hypertension 5.  Osteoarthritis   [name] has been diagnosed with metastatic pancreas cancer.  I discussed the prognosis and treatment options with her today.  She understands no therapy will be curative.  She has decided to proceed with a trial of systemic therapy.  She understands the goals of chemotherapy are to improve her symptoms and potentially prolong survival.  I agree with the recommendation of Dr. Burr Medico to proceed with gemcitabine/Abraxane.  We reviewed potential toxicities associated with this chemotherapy regimen.  She understands the chance of nausea, alopecia, and hematologic toxicity.  We discussed the fever, rash, and pneumonitis associated with gemcitabine.  We reviewed the allergic reaction and neuropathy associated with Abraxane.  She agrees to proceed.  She will attend a chemotherapy teaching class.  [name] is scheduled for Port-A-Cath placement on 09/22/2020.  The plan is to begin gemcitabine/Abraxane on a day 1, day 15 schedule 09/27/2020.  She will begin a trial of hydrocodone as needed for pain.  She will continue daily MiraLAX and add a stool softener for constipation.  We referred her to the genetics counselor.

## 2020-09-20 NOTE — Telephone Encounter (Signed)
Spoke to pt's daughter [name] told her I had another provider Alyssa review her chart and message and she said Those levels are not urgent. I think this is ok to wait for Dr. Jonni Sanger to addressin person if we can schedule her a visit to discuss please. [name] verbalized understanding and said to contact pt later this afternoon to schedule appointment. Told her okay.

## 2020-09-20 NOTE — Telephone Encounter (Signed)
Alyssa, please see messages about pt's sugars and advise. Thanks

## 2020-09-21 ENCOUNTER — Telehealth: Payer: Self-pay | Admitting: Dietician

## 2020-09-21 ENCOUNTER — Other Ambulatory Visit: Payer: Self-pay | Admitting: Radiology

## 2020-09-21 NOTE — Telephone Encounter (Signed)
Scheduled appt per 7/6 sch msg. Called pt, no answer. Left msg with appt date and time.

## 2020-09-22 ENCOUNTER — Encounter (HOSPITAL_COMMUNITY): Payer: Self-pay

## 2020-09-22 ENCOUNTER — Other Ambulatory Visit: Payer: Self-pay

## 2020-09-22 ENCOUNTER — Observation Stay (HOSPITAL_COMMUNITY)
Admission: RE | Admit: 2020-09-22 | Discharge: 2020-09-22 | Disposition: A | Discharge status: Home / Self Care | Payer: Medicare HMO | Source: Ambulatory Visit | Attending: Hematology | Admitting: Hematology

## 2020-09-22 ENCOUNTER — Ambulatory Visit (HOSPITAL_COMMUNITY)
Admission: RE | Admit: 2020-09-22 | Discharge: 2020-09-22 | Disposition: A | Discharge status: Home / Self Care | Payer: Medicare HMO | Source: Ambulatory Visit | Attending: Hematology | Admitting: Hematology

## 2020-09-22 DIAGNOSIS — I10 Essential (primary) hypertension: Secondary | ICD-10-CM | POA: Diagnosis present

## 2020-09-22 DIAGNOSIS — C7800 Secondary malignant neoplasm of unspecified lung: Secondary | ICD-10-CM | POA: Diagnosis present

## 2020-09-22 DIAGNOSIS — Z833 Family history of diabetes mellitus: Secondary | ICD-10-CM | POA: Diagnosis not present

## 2020-09-22 DIAGNOSIS — C7889 Secondary malignant neoplasm of other digestive organs: Secondary | ICD-10-CM | POA: Diagnosis not present

## 2020-09-22 DIAGNOSIS — Z87891 Personal history of nicotine dependence: Secondary | ICD-10-CM | POA: Diagnosis not present

## 2020-09-22 DIAGNOSIS — K831 Obstruction of bile duct: Secondary | ICD-10-CM | POA: Diagnosis present

## 2020-09-22 DIAGNOSIS — F411 Generalized anxiety disorder: Secondary | ICD-10-CM | POA: Diagnosis present

## 2020-09-22 DIAGNOSIS — Z880 Allergy status to penicillin: Secondary | ICD-10-CM | POA: Diagnosis not present

## 2020-09-22 DIAGNOSIS — Z90710 Acquired absence of both cervix and uterus: Secondary | ICD-10-CM | POA: Diagnosis not present

## 2020-09-22 DIAGNOSIS — Z20822 Contact with and (suspected) exposure to covid-19: Secondary | ICD-10-CM | POA: Diagnosis present

## 2020-09-22 DIAGNOSIS — E860 Dehydration: Secondary | ICD-10-CM | POA: Diagnosis present

## 2020-09-22 DIAGNOSIS — E039 Hypothyroidism, unspecified: Secondary | ICD-10-CM | POA: Diagnosis present

## 2020-09-22 DIAGNOSIS — R932 Abnormal findings on diagnostic imaging of liver and biliary tract: Secondary | ICD-10-CM | POA: Diagnosis not present

## 2020-09-22 DIAGNOSIS — Z79899 Other long term (current) drug therapy: Secondary | ICD-10-CM | POA: Diagnosis not present

## 2020-09-22 DIAGNOSIS — Z66 Do not resuscitate: Secondary | ICD-10-CM | POA: Diagnosis present

## 2020-09-22 DIAGNOSIS — C259 Malignant neoplasm of pancreas, unspecified: Secondary | ICD-10-CM | POA: Diagnosis present

## 2020-09-22 DIAGNOSIS — F32A Depression, unspecified: Secondary | ICD-10-CM | POA: Diagnosis present

## 2020-09-22 DIAGNOSIS — K8689 Other specified diseases of pancreas: Secondary | ICD-10-CM | POA: Diagnosis not present

## 2020-09-22 DIAGNOSIS — F1010 Alcohol abuse, uncomplicated: Secondary | ICD-10-CM | POA: Diagnosis present

## 2020-09-22 DIAGNOSIS — C786 Secondary malignant neoplasm of retroperitoneum and peritoneum: Secondary | ICD-10-CM | POA: Diagnosis present

## 2020-09-22 DIAGNOSIS — R17 Unspecified jaundice: Secondary | ICD-10-CM | POA: Diagnosis not present

## 2020-09-22 DIAGNOSIS — Z79890 Hormone replacement therapy (postmenopausal): Secondary | ICD-10-CM | POA: Insufficient documentation

## 2020-09-22 DIAGNOSIS — E1165 Type 2 diabetes mellitus with hyperglycemia: Secondary | ICD-10-CM | POA: Diagnosis present

## 2020-09-22 DIAGNOSIS — Z8673 Personal history of transient ischemic attack (TIA), and cerebral infarction without residual deficits: Secondary | ICD-10-CM | POA: Diagnosis not present

## 2020-09-22 DIAGNOSIS — Z7982 Long term (current) use of aspirin: Secondary | ICD-10-CM | POA: Diagnosis not present

## 2020-09-22 DIAGNOSIS — Z820 Family history of epilepsy and other diseases of the nervous system: Secondary | ICD-10-CM | POA: Diagnosis not present

## 2020-09-22 DIAGNOSIS — Z48815 Encounter for surgical aftercare following surgery on the digestive system: Secondary | ICD-10-CM | POA: Diagnosis not present

## 2020-09-22 DIAGNOSIS — R531 Weakness: Secondary | ICD-10-CM | POA: Diagnosis not present

## 2020-09-22 DIAGNOSIS — C250 Malignant neoplasm of head of pancreas: Secondary | ICD-10-CM | POA: Diagnosis not present

## 2020-09-22 DIAGNOSIS — R109 Unspecified abdominal pain: Secondary | ICD-10-CM | POA: Diagnosis not present

## 2020-09-22 DIAGNOSIS — E782 Mixed hyperlipidemia: Secondary | ICD-10-CM | POA: Diagnosis present

## 2020-09-22 DIAGNOSIS — C787 Secondary malignant neoplasm of liver and intrahepatic bile duct: Secondary | ICD-10-CM | POA: Diagnosis present

## 2020-09-22 DIAGNOSIS — R7989 Other specified abnormal findings of blood chemistry: Secondary | ICD-10-CM | POA: Diagnosis present

## 2020-09-22 DIAGNOSIS — Z452 Encounter for adjustment and management of vascular access device: Secondary | ICD-10-CM | POA: Diagnosis not present

## 2020-09-22 HISTORY — PX: IR IMAGING GUIDED PORT INSERTION: IMG5740

## 2020-09-22 LAB — GLUCOSE, CAPILLARY: Glucose-Capillary: 170 mg/dL — ABNORMAL HIGH (ref 70–99)

## 2020-09-22 MED ORDER — ALPRAZOLAM 0.25 MG PO TABS
0.2500 mg | ORAL_TABLET | ORAL | Status: AC
  Administered 2020-09-22: 0.25 mg via ORAL
  Filled 2020-09-22: qty 1

## 2020-09-22 MED ORDER — FENTANYL CITRATE (PF) 100 MCG/2ML IJ SOLN
INTRAMUSCULAR | Status: AC
  Filled 2020-09-22: qty 2

## 2020-09-22 MED ORDER — MIDAZOLAM HCL 2 MG/2ML IJ SOLN
INTRAMUSCULAR | Status: AC
  Filled 2020-09-22: qty 4

## 2020-09-22 MED ORDER — LIDOCAINE-EPINEPHRINE 1 %-1:100000 IJ SOLN
INTRAMUSCULAR | Status: AC
  Filled 2020-09-22: qty 1

## 2020-09-22 MED ORDER — FENTANYL CITRATE (PF) 100 MCG/2ML IJ SOLN
INTRAMUSCULAR | Status: AC | PRN
  Administered 2020-09-22 (×2): 50 ug via INTRAVENOUS

## 2020-09-22 MED ORDER — LIDOCAINE-EPINEPHRINE 1 %-1:100000 IJ SOLN
INTRAMUSCULAR | Status: AC | PRN
  Administered 2020-09-22: 20 mL

## 2020-09-22 MED ORDER — MIDAZOLAM HCL 2 MG/2ML IJ SOLN
INTRAMUSCULAR | Status: AC | PRN
  Administered 2020-09-22 (×2): 1 mg via INTRAVENOUS

## 2020-09-22 MED ORDER — HEPARIN SOD (PORK) LOCK FLUSH 100 UNIT/ML IV SOLN
INTRAVENOUS | Status: AC
  Filled 2020-09-22: qty 5

## 2020-09-22 MED ORDER — SODIUM CHLORIDE 0.9 % IV SOLN: INTRAVENOUS | Status: DC

## 2020-09-22 NOTE — Procedures (Signed)
  Procedure: R chest port placement   EBL:   minimal Complications:  none immediate  See full dictation in [facility].  Dillard Cannon MD Main # [phone] Pager  [phone]

## 2020-09-22 NOTE — Discharge Instructions (Signed)
Urgent needs - Interventional Radiology on call MD [phone]  Wound - May remove dressing and shower in 24 to 48 hours.  Keep site clean and dry.  Replace with bandaid as needed.  Do not submerge in tub or water until site healing well. If closed with glue, glue will flake off on its own.  If ordered by your provider, may start Emla cream in 2 weeks or after incision is healed.  After completion of treatment, your provider should have you set up for monthly port flushes.   

## 2020-09-22 NOTE — H&P (Signed)
Referring Physician(s): Feng,Yan  Supervising Physician: Arne Cleveland  Patient Status:  [name] OP  Chief Complaint: "I'm here for a port a cath"   Subjective: Patient familiar to IR service from right liver lesion biopsy on 09/08/2020.  She has a history of newly diagnosed metastatic pancreatic carcinoma and presents today for Port-A-Cath placement to assist with treatment. She currently denies fever,HA,CP,dyspnea, cough, N/V or bleeding. She does have some epigastric and back discomfort. Additional hx as below.   Past Medical History:  Diagnosis Date   Anxiety    Arthritis    Depression    Diabetes mellitus without complication ([place])    Hyperlipemia    Hypertension    Thyroid disease    TIA (transient ischemic attack)    Vitamin B12 deficiency 10/20/2019   Past Surgical History:  Procedure Laterality Date   ABDOMINAL HYSTERECTOMY     BREAST BIOPSY     TUBAL LIGATION        Allergies: Penicillins  Medications: Prior to Admission medications   Medication Sig Start Date End Date Taking? Authorizing Provider  ACCU-CHEK AVIVA PLUS test strip  08/11/19   [provider]  Accu-Chek Softclix Lancets lancets  03/30/20   [provider]  ALPRAZolam Duanne Moron) 0.5 MG tablet Take 1 tablet (0.5 mg total) by mouth daily as needed for anxiety. 09/14/20   [name], MD  amLODipine (NORVASC) 5 MG tablet Take 1 tablet (5 mg total) by mouth daily. 09/14/20   [name], MD  aspirin EC 81 MG tablet Take 81 mg by mouth daily.    [provider]  Blood Glucose Monitoring Suppl (ACCU-CHEK GUIDE) w/Device KIT  10/14/19   [provider]  cyanocobalamin (,VITAMIN B-12,) 1000 MCG/ML injection INJECT 1 ML EVERY 2 WEEKS 08/21/20   [name], MD  HYDROcodone-acetaminophen (NORCO) 5-325 MG tablet Take 0.5-1 tablets by mouth every 6 (six) hours as needed for moderate pain. 09/20/20   Ladell Pier, MD  levothyroxine (SYNTHROID) 125 MCG tablet TAKE 1  TABLET ON AN EMPTY STOMACH IN THE MORNING 09/14/20   [name], MD  lidocaine-prilocaine (EMLA) cream Apply 1 application topically as needed. Patient not taking: Reported on 09/20/2020 09/13/20   Truitt Merle, MD  omeprazole (PRILOSEC) 20 MG capsule Take 1 capsule (20 mg total) by mouth daily. 07/26/20   [name], MD  ondansetron (ZOFRAN) 8 MG tablet Take 1 tablet (8 mg total) by mouth 2 (two) times daily as needed (Nausea or vomiting). Patient not taking: Reported on 09/20/2020 09/12/20   Truitt Merle, MD  Polyethylene Glycol 3350 (MIRALAX PO) Take by mouth.    [provider]  prochlorperazine (COMPAZINE) 10 MG tablet Take 1 tablet (10 mg total) by mouth every 6 (six) hours as needed (Nausea or vomiting). Patient not taking: Reported on 09/20/2020 09/12/20   Truitt Merle, MD  rosuvastatin (CRESTOR) 5 MG tablet Take 1 tablet (5 mg total) by mouth 3 (three) times a week. Patient not taking: Reported on 09/20/2020 01/28/20   [name], MD     Vital Signs: BP 139/86   Pulse 85   Temp 98 F (36.7 C) (Oral)   Resp 20   SpO2 100%   Physical Exam awake/alert; chest-CTA bilat; heart- RRR; abd- soft,+BS, minimal epigastric tenderness, no LE edema  Imaging: No results found.  Labs:  CBC: Recent Labs    07/26/20 1036 08/18/20 1001 08/31/20 1548 09/08/20 1140  WBC 8.2 8.1 9.9 9.2  HGB 15.2* 15.5* 15.5* 15.9*  HCT 44.8 45.4 45.4 47.1*  PLT 168.0 185.0 188 179    COAGS: Recent Labs    09/08/20 1140  INR 1.0    BMP: Recent Labs    11/08/19 0827 07/26/20 1036 08/18/20 1001 08/31/20 1548  NA 137 136 135 136  K 4.8 4.0 4.3 4.9  CL 101 101 99 103  CO2 _0 GLUCOSE 157* 157* 149* 122*  BUN _1 CALCIUM 9.9 9.8 10.1 10.4*  CREATININE 0.84 0.84 0.92 1.01*  GFRNONAA  --   --   --  57*    LIVER FUNCTION TESTS: Recent Labs    11/08/19 0827 07/26/20 1036 08/18/20 1001 08/31/20 1548  BILITOT 0.7 0.9 0.7 0.8  AST _2 ALT _3 ALKPHOS  --  64 70 79  PROT 7.0 7.4 7.6 7.7  ALBUMIN  --  4.6 4.6 4.5    Assessment and Plan: Patient familiar to IR service from right liver lesion biopsy on 09/08/2020.  She has a history of newly diagnosed metastatic pancreatic carcinoma and presents today for Port-A-Cath placement to assist with treatment. Risks and benefits of image guided port-a-catheter placement was discussed with the patient including, but not limited to bleeding, infection, pneumothorax, or fibrin sheath development and need for additional procedures.  All of the patient's questions were answered, patient is agreeable to proceed. Consent signed and in chart.    Electronically Signed: D. Rowe Robert, PA-C 09/22/2020, 12:03 PM   I spent a total of 25 minutes at the the patient's bedside AND on the patient's hospital floor or unit, greater than 50% of which was counseling/coordinating care for port a cath placement

## 2020-09-24 ENCOUNTER — Inpatient Hospital Stay (HOSPITAL_COMMUNITY)
Admission: EM | Admit: 2020-09-24 | Discharge: 2020-09-29 | DRG: 424 | Disposition: A | Discharge status: Home Health | Payer: Medicare HMO | Attending: Internal Medicine | Admitting: Internal Medicine

## 2020-09-24 ENCOUNTER — Other Ambulatory Visit: Payer: Self-pay | Admitting: Oncology

## 2020-09-24 ENCOUNTER — Encounter (HOSPITAL_COMMUNITY): Payer: Self-pay | Admitting: Emergency Medicine

## 2020-09-24 ENCOUNTER — Emergency Department (HOSPITAL_COMMUNITY): Payer: Medicare HMO

## 2020-09-24 ENCOUNTER — Other Ambulatory Visit: Payer: Self-pay

## 2020-09-24 DIAGNOSIS — C787 Secondary malignant neoplasm of liver and intrahepatic bile duct: Secondary | ICD-10-CM | POA: Diagnosis present

## 2020-09-24 DIAGNOSIS — Z833 Family history of diabetes mellitus: Secondary | ICD-10-CM

## 2020-09-24 DIAGNOSIS — M549 Dorsalgia, unspecified: Secondary | ICD-10-CM | POA: Diagnosis present

## 2020-09-24 DIAGNOSIS — Z90710 Acquired absence of both cervix and uterus: Secondary | ICD-10-CM

## 2020-09-24 DIAGNOSIS — E1165 Type 2 diabetes mellitus with hyperglycemia: Secondary | ICD-10-CM | POA: Diagnosis present

## 2020-09-24 DIAGNOSIS — Z803 Family history of malignant neoplasm of breast: Secondary | ICD-10-CM

## 2020-09-24 DIAGNOSIS — Z8673 Personal history of transient ischemic attack (TIA), and cerebral infarction without residual deficits: Secondary | ICD-10-CM

## 2020-09-24 DIAGNOSIS — F419 Anxiety disorder, unspecified: Secondary | ICD-10-CM | POA: Diagnosis present

## 2020-09-24 DIAGNOSIS — Z880 Allergy status to penicillin: Secondary | ICD-10-CM

## 2020-09-24 DIAGNOSIS — Z820 Family history of epilepsy and other diseases of the nervous system: Secondary | ICD-10-CM

## 2020-09-24 DIAGNOSIS — Z8249 Family history of ischemic heart disease and other diseases of the circulatory system: Secondary | ICD-10-CM

## 2020-09-24 DIAGNOSIS — Z79899 Other long term (current) drug therapy: Secondary | ICD-10-CM

## 2020-09-24 DIAGNOSIS — Z811 Family history of alcohol abuse and dependence: Secondary | ICD-10-CM

## 2020-09-24 DIAGNOSIS — C259 Malignant neoplasm of pancreas, unspecified: Secondary | ICD-10-CM | POA: Diagnosis present

## 2020-09-24 DIAGNOSIS — K831 Obstruction of bile duct: Secondary | ICD-10-CM

## 2020-09-24 DIAGNOSIS — F32A Depression, unspecified: Secondary | ICD-10-CM | POA: Diagnosis present

## 2020-09-24 DIAGNOSIS — R52 Pain, unspecified: Secondary | ICD-10-CM

## 2020-09-24 DIAGNOSIS — E860 Dehydration: Secondary | ICD-10-CM | POA: Diagnosis present

## 2020-09-24 DIAGNOSIS — Z8507 Personal history of malignant neoplasm of pancreas: Secondary | ICD-10-CM

## 2020-09-24 DIAGNOSIS — Z7982 Long term (current) use of aspirin: Secondary | ICD-10-CM

## 2020-09-24 DIAGNOSIS — F411 Generalized anxiety disorder: Secondary | ICD-10-CM | POA: Diagnosis present

## 2020-09-24 DIAGNOSIS — F1010 Alcohol abuse, uncomplicated: Secondary | ICD-10-CM | POA: Diagnosis present

## 2020-09-24 DIAGNOSIS — Z87891 Personal history of nicotine dependence: Secondary | ICD-10-CM

## 2020-09-24 DIAGNOSIS — Z66 Do not resuscitate: Secondary | ICD-10-CM | POA: Diagnosis present

## 2020-09-24 DIAGNOSIS — K5900 Constipation, unspecified: Secondary | ICD-10-CM | POA: Diagnosis not present

## 2020-09-24 DIAGNOSIS — R82998 Other abnormal findings in urine: Secondary | ICD-10-CM | POA: Insufficient documentation

## 2020-09-24 DIAGNOSIS — R7989 Other specified abnormal findings of blood chemistry: Secondary | ICD-10-CM | POA: Diagnosis present

## 2020-09-24 DIAGNOSIS — I10 Essential (primary) hypertension: Secondary | ICD-10-CM | POA: Diagnosis present

## 2020-09-24 DIAGNOSIS — M1990 Unspecified osteoarthritis, unspecified site: Secondary | ICD-10-CM | POA: Diagnosis present

## 2020-09-24 DIAGNOSIS — C7800 Secondary malignant neoplasm of unspecified lung: Secondary | ICD-10-CM | POA: Diagnosis present

## 2020-09-24 DIAGNOSIS — Z825 Family history of asthma and other chronic lower respiratory diseases: Secondary | ICD-10-CM

## 2020-09-24 DIAGNOSIS — E782 Mixed hyperlipidemia: Secondary | ICD-10-CM | POA: Diagnosis present

## 2020-09-24 DIAGNOSIS — C786 Secondary malignant neoplasm of retroperitoneum and peritoneum: Secondary | ICD-10-CM | POA: Diagnosis present

## 2020-09-24 DIAGNOSIS — E119 Type 2 diabetes mellitus without complications: Secondary | ICD-10-CM

## 2020-09-24 DIAGNOSIS — Z79890 Hormone replacement therapy (postmenopausal): Secondary | ICD-10-CM

## 2020-09-24 DIAGNOSIS — Z20822 Contact with and (suspected) exposure to covid-19: Secondary | ICD-10-CM | POA: Diagnosis present

## 2020-09-24 DIAGNOSIS — E039 Hypothyroidism, unspecified: Secondary | ICD-10-CM | POA: Diagnosis present

## 2020-09-24 HISTORY — DX: Malignant (primary) neoplasm, unspecified: C80.1

## 2020-09-24 LAB — COMPREHENSIVE METABOLIC PANEL
ALT: 437 U/L — ABNORMAL HIGH (ref 0–44)
AST: 292 U/L — ABNORMAL HIGH (ref 15–41)
Albumin: 4.1 g/dL (ref 3.5–5.0)
Alkaline Phosphatase: 224 U/L — ABNORMAL HIGH (ref 38–126)
Anion gap: 10 (ref 5–15)
BUN: 7 mg/dL — ABNORMAL LOW (ref 8–23)
CO2: 20 mmol/L — ABNORMAL LOW (ref 22–32)
Calcium: 9.6 mg/dL (ref 8.9–10.3)
Chloride: 103 mmol/L (ref 98–111)
Creatinine, Ser: 0.69 mg/dL (ref 0.44–1.00)
GFR, Estimated: >60 mL/min (ref >60)
Glucose, Bld: 186 mg/dL — ABNORMAL HIGH (ref 70–99)
Potassium: 3.8 mmol/L (ref 3.5–5.1)
Sodium: 133 mmol/L — ABNORMAL LOW (ref 135–145)
Total Bilirubin: 6.7 mg/dL — ABNORMAL HIGH (ref 0.3–1.2)
Total Protein: 7.2 g/dL (ref 6.5–8.1)

## 2020-09-24 LAB — CBC WITH DIFFERENTIAL/PLATELET
Abs Immature Granulocytes: 0.03 10*3/uL (ref 0.00–0.07)
Basophils Absolute: 0 10*3/uL (ref 0.0–0.1)
Basophils Relative: 1 %
Eosinophils Absolute: 0.1 10*3/uL (ref 0.0–0.5)
Eosinophils Relative: 1 %
HCT: 45.3 % (ref 36.0–46.0)
Hemoglobin: 15.4 g/dL — ABNORMAL HIGH (ref 12.0–15.0)
Immature Granulocytes: 1 %
Lymphocytes Relative: 26 %
Lymphs Abs: 1.7 10*3/uL (ref 0.7–4.0)
MCH: 30.8 pg (ref 26.0–34.0)
MCHC: 34 g/dL (ref 30.0–36.0)
MCV: 90.6 fL (ref 80.0–100.0)
Monocytes Absolute: 0.4 10*3/uL (ref 0.1–1.0)
Monocytes Relative: 6 %
Neutro Abs: 4.3 10*3/uL (ref 1.7–7.7)
Neutrophils Relative %: 65 %
Platelets: 160 10*3/uL (ref 150–400)
RBC: 5 MIL/uL (ref 3.87–5.11)
RDW: 13.5 % (ref 11.5–15.5)
WBC: 6.6 10*3/uL (ref 4.0–10.5)
nRBC: 0 % (ref 0.0–0.2)

## 2020-09-24 LAB — GLUCOSE, CAPILLARY: Glucose-Capillary: 180 mg/dL — ABNORMAL HIGH (ref 70–99)

## 2020-09-24 LAB — URINALYSIS, ROUTINE W REFLEX MICROSCOPIC
Bilirubin Urine: NEGATIVE
Glucose, UA: >=500 mg/dL — AB
Hgb urine dipstick: NEGATIVE
Ketones, ur: NEGATIVE mg/dL
Leukocytes,Ua: NEGATIVE
Nitrite: NEGATIVE
Protein, ur: NEGATIVE mg/dL
Specific Gravity, Urine: 1 — ABNORMAL LOW (ref 1.005–1.030)
pH: 6 (ref 5.0–8.0)

## 2020-09-24 LAB — CBG MONITORING, ED: Glucose-Capillary: 180 mg/dL — ABNORMAL HIGH (ref 70–99)

## 2020-09-24 LAB — LIPASE, BLOOD: Lipase: 34 U/L (ref 11–51)

## 2020-09-24 MED ORDER — PANTOPRAZOLE SODIUM 40 MG PO TBEC
40.0000 mg | DELAYED_RELEASE_TABLET | Freq: Every day | ORAL | Status: DC
  Administered 2020-09-25 – 2020-09-29 (×4): 40 mg via ORAL
  Filled 2020-09-24 (×4): qty 1

## 2020-09-24 MED ORDER — ASPIRIN EC 81 MG PO TBEC
81.0000 mg | DELAYED_RELEASE_TABLET | Freq: Every day | ORAL | Status: DC
  Administered 2020-09-25 – 2020-09-29 (×4): 81 mg via ORAL
  Filled 2020-09-24 (×4): qty 1

## 2020-09-24 MED ORDER — IOHEXOL 350 MG/ML SOLN
80.0000 mL | Freq: Once | INTRAVENOUS | Status: AC | PRN
  Administered 2020-09-24: 60 mL via INTRAVENOUS

## 2020-09-24 MED ORDER — INSULIN ASPART 100 UNIT/ML IJ SOLN
0.0000 [IU] | Freq: Three times a day (TID) | INTRAMUSCULAR | Status: DC
  Administered 2020-09-25 – 2020-09-29 (×5): 1 [IU] via SUBCUTANEOUS

## 2020-09-24 MED ORDER — SODIUM CHLORIDE 0.9 % IV BOLUS
1000.0000 mL | Freq: Once | INTRAVENOUS | Status: AC
  Administered 2020-09-24: 1000 mL via INTRAVENOUS

## 2020-09-24 MED ORDER — DOCUSATE SODIUM 100 MG PO CAPS
100.0000 mg | ORAL_CAPSULE | Freq: Two times a day (BID) | ORAL | Status: DC
  Administered 2020-09-25 – 2020-09-29 (×6): 100 mg via ORAL
  Filled 2020-09-24 (×6): qty 1

## 2020-09-24 MED ORDER — ALPRAZOLAM 0.5 MG PO TABS
0.5000 mg | ORAL_TABLET | Freq: Every day | ORAL | Status: DC | PRN
  Administered 2020-09-24 – 2020-09-25 (×2): 0.5 mg via ORAL
  Filled 2020-09-24 (×2): qty 1

## 2020-09-24 MED ORDER — ALPRAZOLAM 0.25 MG PO TABS
0.2500 mg | ORAL_TABLET | Freq: Once | ORAL | Status: AC
  Administered 2020-09-24: 0.25 mg via ORAL
  Filled 2020-09-24: qty 1

## 2020-09-24 MED ORDER — AMLODIPINE BESYLATE 5 MG PO TABS
5.0000 mg | ORAL_TABLET | Freq: Every day | ORAL | Status: DC
  Administered 2020-09-25: 5 mg via ORAL
  Filled 2020-09-24: qty 1

## 2020-09-24 MED ORDER — ONDANSETRON HCL 8 MG PO TABS
8.0000 mg | ORAL_TABLET | Freq: Two times a day (BID) | ORAL | Status: DC | PRN
  Administered 2020-09-24 – 2020-09-29 (×5): 8 mg via ORAL
  Filled 2020-09-24 (×5): qty 1

## 2020-09-24 MED ORDER — SODIUM CHLORIDE 0.9 % IV SOLN
INTRAVENOUS | Status: DC
  Administered 2020-09-28: 75 mL via INTRAVENOUS

## 2020-09-24 MED ORDER — PROCHLORPERAZINE MALEATE 10 MG PO TABS: 10.0000 mg | ORAL_TABLET | Freq: Four times a day (QID) | ORAL | Status: DC | PRN

## 2020-09-24 MED ORDER — LEVOTHYROXINE SODIUM 25 MCG PO TABS
125.0000 ug | ORAL_TABLET | Freq: Every day | ORAL | Status: DC
  Administered 2020-09-25 – 2020-09-29 (×4): 125 ug via ORAL
  Filled 2020-09-24 (×4): qty 1

## 2020-09-24 MED ORDER — ENOXAPARIN SODIUM 40 MG/0.4ML IJ SOSY
40.0000 mg | PREFILLED_SYRINGE | INTRAMUSCULAR | Status: DC
  Administered 2020-09-24 – 2020-09-26 (×3): 40 mg via SUBCUTANEOUS
  Filled 2020-09-24 (×3): qty 0.4

## 2020-09-24 NOTE — ED Triage Notes (Signed)
Patient states you had a port placed Friday, since then she has had decreased appetite, dark urine since last evening, dark brown. Is able to tolerate PO fluids. Denies N/V.

## 2020-09-24 NOTE — ED Provider Notes (Signed)
[facility] DEPT Provider Note   CSN: [id number] Arrival date & time: 09/24/20  9381     History Chief Complaint  Patient presents with   dark urine    [name] is a 75 y.o. female.  Patient has pancreatic cancer and presents today with dark urine.  Patient has a history of metastatic pancreatic cancer.  She is to start chemo on Wednesday  The history is provided by the patient and medical records. No language interpreter was used.  Weakness Severity:  Mild Onset quality:  Gradual Duration: 1 week. Timing:  Constant Progression:  Waxing and waning Chronicity:  Recurrent Context: not alcohol use   Relieved by:  Nothing Worsened by:  Nothing Ineffective treatments:  None tried Associated symptoms: no abdominal pain, no chest pain, no cough, no diarrhea, no frequency, no headaches and no seizures   Risk factors: no congestive heart failure       Past Medical History:  Diagnosis Date   Anxiety    Arthritis    Cancer ([place])    Depression    Diabetes mellitus without complication ([place])    Hyperlipemia    Hypertension    Thyroid disease    TIA (transient ischemic attack)    Vitamin B12 deficiency 10/20/2019    Patient Active Problem List   Diagnosis Date Noted   Primary pancreatic cancer with metastasis to other site ([place]) 09/12/2020   Alcohol use, 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[place]) 08/18/2020   Vitamin B12 deficiency 10/20/2019   GAD (generalized anxiety disorder) 10/20/2019   Major depression, recurrent, chronic ([place]) 10/20/2019   Diet-controlled diabetes mellitus ([place]) 10/20/2019   Osteoarthritis, multiple sites 10/20/2019   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     Family History  Problem Relation Age of Onset   Diabetes Mother    Alzheimer's disease Mother    Dementia Mother    COPD Father    Emphysema Father    Heart disease Sister    Alcohol abuse Brother    Heart disease Brother    Emphysema Brother    Heart attack Daughter    Cancer Other        breast cancer    Social History   Tobacco Use   Smoking status: Former    Packs/day: 1.00    Years: 10.00    Pack years: 10.00    Types: Cigarettes    Quit date: 05/25/1987    Years since quitting: 33.3   Smokeless tobacco: Never  Vaping Use   Vaping Use: Never used  Substance Use Topics   Alcohol use: Yes    Alcohol/week: 21.0 standard drinks    Types: 21 Glasses of wine per week    Comment: 3 glasses of wine daily, she stopped in 08/2020   Drug use: No    Home Medications Prior to Admission medications   Medication Sig Start Date End Date Taking? Authorizing Provider  ACCU-CHEK AVIVA PLUS test strip  08/11/19  Yes [provider]  Accu-Chek Softclix Lancets lancets  03/30/20  Yes [provider]  ALPRAZolam (XANAX) 0.5 MG tablet Take 1 tablet (0.5 mg total) by mouth daily as needed for anxiety. 09/14/20  Yes [name], MD  amLODipine (NORVASC) 5 MG tablet Take 1 tablet (5 mg total) by mouth daily. 09/14/20  Yes [name], MD  aspirin EC 81 MG tablet Take 81 mg by mouth daily.   Yes [provider]  Blood Glucose Monitoring Suppl (ACCU-CHEK GUIDE) w/Device KIT  10/14/19  Yes [provider]  cyanocobalamin (,VITAMIN B-12,) 1000 MCG/ML injection INJECT 1 ML EVERY 2 WEEKS 08/21/20  Yes [name], MD  docusate sodium (COLACE) 100 MG capsule Take 100 mg by mouth 2 (two) times daily.   Yes [provider]  levothyroxine (SYNTHROID) 125 MCG  tablet TAKE 1 TABLET ON AN EMPTY STOMACH IN THE MORNING 09/14/20  Yes [name], MD  lidocaine-prilocaine (EMLA) cream Apply 1 application topically as needed. 09/13/20  Yes Truitt Merle, MD  omeprazole (PRILOSEC) 20 MG capsule Take 1 capsule (20 mg total) by mouth daily. 07/26/20  Yes [name], MD  ondansetron (ZOFRAN) 8 MG tablet Take 1 tablet (8 mg total) by mouth 2 (two) times daily as needed (Nausea or vomiting). 09/12/20  Yes Truitt Merle, MD  prochlorperazine (COMPAZINE) 10 MG tablet Take 1 tablet (10 mg total) by mouth every 6 (six) hours as needed (Nausea or vomiting). 09/12/20  Yes Truitt Merle, MD  HYDROcodone-acetaminophen (NORCO) 5-325 MG tablet Take 0.5-1 tablets by mouth every 6 (six) hours as needed for moderate pain. Patient not taking: No sig reported 09/20/20   Ladell Pier, MD  Polyethylene Glycol 3350 (MIRALAX PO) Take by mouth. Patient not taking: No sig reported    [provider]  rosuvastatin (CRESTOR) 5 MG tablet Take 1 tablet (5 mg total) by mouth 3 (three) times a week. Patient not taking: No sig reported 01/28/20   [name], MD    Allergies    Penicillins  Review of Systems   Review of Systems  Constitutional:  Negative for appetite change and fatigue.  HENT:  Negative for congestion, ear discharge and sinus pressure.   Eyes:  Negative for discharge.  Respiratory:  Negative for cough.   Cardiovascular:  Negative for chest pain.  Gastrointestinal:  Negative for abdominal pain and diarrhea.  Genitourinary:  Negative for frequency and hematuria.       Dark urine  Musculoskeletal:  Negative for back pain.  Skin:  Negative for rash.  Neurological:  Positive for weakness. Negative for seizures and headaches.  Psychiatric/Behavioral:  Negative for hallucinations.    Physical Exam Updated Vital Signs BP (!) 156/92   Pulse 87   Temp 98.7 F (37.1 C) (Oral)   Resp 16   SpO2 98%   Physical Exam Vitals and nursing note reviewed.   Constitutional:      Appearance: She is well-developed.  HENT:     Head: Normocephalic.     Nose: Nose normal.  Eyes:     General: No scleral icterus.    Conjunctiva/sclera: Conjunctivae normal.  Neck:     Thyroid: No thyromegaly.  Cardiovascular:     Rate and Rhythm: Normal rate and regular rhythm.     Heart sounds: No murmur heard.   No friction rub. No gallop.  Pulmonary:     Breath sounds: No stridor. No wheezing or rales.  Chest:     Chest wall:  No tenderness.  Abdominal:     General: There is no distension.     Tenderness: There is abdominal tenderness. There is no rebound.     Comments: Minimal tenderness to abdomen  Musculoskeletal:        General: Normal range of motion.     Cervical back: Neck supple.  Lymphadenopathy:     Cervical: No cervical adenopathy.  Skin:    Findings: No erythema or rash.  Neurological:     Mental Status: She is alert and oriented to person, place, and time.     Motor: No abnormal muscle tone.     Coordination: Coordination normal.  Psychiatric:        Behavior: Behavior normal.    ED Results / Procedures / Treatments   Labs (all labs ordered are listed, but only abnormal results are displayed) Labs Reviewed  URINALYSIS, ROUTINE W REFLEX MICROSCOPIC - Abnormal; Notable for the following components:      Result Value   Color, Urine AMBER (*)    Specific Gravity, Urine 1.000 (*)    Glucose, UA >=500 (*)    Bacteria, UA RARE (*)    All other components within normal limits  CBC WITH DIFFERENTIAL/PLATELET - Abnormal; Notable for the following components:   Hemoglobin 15.4 (*)    All other components within normal limits  COMPREHENSIVE METABOLIC PANEL - Abnormal; Notable for the following components:   Sodium 133 (*)    CO2 20 (*)    Glucose, Bld 186 (*)    BUN 7 (*)    AST 292 (*)    ALT 437 (*)    Alkaline Phosphatase 224 (*)    Total Bilirubin 6.7 (*)    All other components within normal limits  CBG MONITORING, ED -  Abnormal; Notable for the following components:   Glucose-Capillary 180 (*)    All other components within normal limits  LIPASE, BLOOD    EKG None  Radiology CT ABDOMEN PELVIS W CONTRAST  Result Date: 09/24/2020 CLINICAL DATA:  Abdominal pain, recent diagnosis pancreatic cancer EXAM: CT ABDOMEN AND PELVIS WITH CONTRAST TECHNIQUE: Multidetector CT imaging of the abdomen and pelvis was performed using the standard protocol following bolus administration of intravenous contrast. CONTRAST:  28m OMNIPAQUE IOHEXOL 350 MG/ML SOLN COMPARISON:  MR abdomen, 08/27/2020 FINDINGS: Lower chest: No acute abnormality. Numerous small pulmonary nodules in the included bilateral lung bases, nonspecific, for example a 5 mm nodule in the peripheral left lower lobe (series 4, image 15) and a 4 mm nodule in the dependent right lower lobe (series 4, image 17). Hepatobiliary: Multiple hypoenhancing masses of the liver parenchyma, not significant changed compared to prior MR. Contracted gallbladder. No gallstones or biliary dilatation. Pancreas: Redemonstrated hypoenhancing mass of the pancreatic neck and body, not significantly changed compared to prior examination (series 2, image 19). No pancreatic ductal dilatation or surrounding inflammatory changes. Spleen: Normal in size without significant abnormality. Adrenals/Urinary Tract: Adrenal glands are unremarkable. Kidneys are normal, without renal calculi, solid lesion, or hydronephrosis. Bladder is unremarkable. Stomach/Bowel: Stomach is within normal limits. Appendix appears normal. No evidence of bowel wall thickening, distention, or inflammatory changes. Sigmoid diverticulosis Vascular/Lymphatic: Aortic atherosclerosis. No enlarged abdominal or pelvic lymph nodes. Reproductive: Status post hysterectomy. Other: No abdominal wall hernia or abnormality. Omental and peritoneal stranding and nodularity, consistent with peritoneal metastatic disease, not significantly changed  (series 2, image 31, 49, 40). No abdominopelvic ascites. Musculoskeletal: No acute or significant osseous findings. IMPRESSION: 1. No acute CT findings of  the abdomen or pelvis to explain pain. 2. Redemonstrated hypoenhancing mass of the pancreatic neck and body, not significantly changed compared to prior examination , consistent with primary pancreatic malignancy. 3. Omental and peritoneal stranding and nodularity, consistent with peritoneal metastatic disease, not significantly changed compared to prior examination. 4. Multiple hypoenhancing liver masses, consistent with hepatic metastatic disease, not significantly changed compared to prior examination. 5. Numerous small pulmonary nodules in the included bilateral lung bases, not well identified on prior MR although highly suspicious for pulmonary metastatic disease . Aortic Atherosclerosis (ICD10-I70.0). Electronically Signed   By: Eddie Candle M.D.   On: 09/24/2020 14:03    Procedures Procedures   Medications Ordered in ED Medications  sodium chloride 0.9 % bolus 1,000 mL (0 mLs Intravenous Stopped 09/24/20 1353)  ALPRAZolam (XANAX) tablet 0.25 mg (0.25 mg Oral Given 09/24/20 1211)  iohexol (OMNIPAQUE) 350 MG/ML injection 80 mL (60 mLs Intravenous Contrast Given 09/24/20 1326)    ED Course  I have reviewed the triage vital signs and the nursing notes.  Pertinent labs & imaging results that were available during my care of the patient were reviewed by me and considered in my medical decision making (see chart for details).  I spoke with Dr. Lottie Rater that she had and she agreed with admitting the patient for hydration. MDM Rules/Calculators/A&P                          Patient with elevated bilirubin secondary to metastatic pancreatic cancer to the liver.  She will be admitted to medicine and hydrated with oncology follow-up tomorrow Final Clinical Impression(s) / ED Diagnoses Final diagnoses:  H/O pancreatic cancer    Rx / DC Orders ED  Discharge Orders     None        Milton Ferguson, MD 09/24/20 1657

## 2020-09-24 NOTE — ED Notes (Signed)
Wound dressing changed where port was placed per MD request.

## 2020-09-24 NOTE — H&P (Signed)
History and Physical  [name]:[id number] DOB: [birth year] DOA: 09/24/2020  Referring physician: Dr. Roderic Palau PCP: [name], MD  Outpatient Specialists: Oncology Patient coming from: Home & is able to ambulate yes  Chief Complaint: Dark urine  HPI: [name] is a 75 y.o. female with medical history significant for new diagnosis of pancreatic cancer, alcohol abuse, type 2 diabetes mellitus, anxiety, hypertension, hyperlipidemia, hypothyroidism, who presented to the emergency department because her urine was noted to be dark. She recently got diagnosed with pancreatic cancer she had her chemotherapy port placed J September 22, 2020.  And supposed to start chemotherapy on Wednesday daughter came out of the room prior to me going to the see patient and stated she wants to make sure that there will be no talk of palliative care because patient is not ready for that yet and she kept repeating [name] asking to know what is going to be done and if there is no other secrete at that he did not think that is going to be done advised her we would not do not do anything without talking to her first now that she is just here for rehydration and her oncologist will be seeing her tomorrow.   ED Course: Patient presented to the emergency department with complaint of dark urine she was sent here by her oncologist.  Who stated for hospitalist to admit and for IV hydration and she will follow-up.  Patient was noted to have high bilirubin CT of the abdomen was done and it shows highly suspicious for metastatic cancer to the liver and pulmonary.  Sodium is slightly low at 133.  Hemoglobin is 15.4.  WBC is normal, alkaline phosphatase elevated to 24, lipase is 34 AST 292 ALT 437 total bili 6.7, total protein 7.2 glucose elevated at 186.  She has glucosuria with greater than 500 glucose in urine Patient received 1 L of IV fluid bolus while in the emergency room.  Also she received alprazolam for  anxiety  Review of Systems: Patient seen has right upper quadrant pain which she states has been that she always has that no other complaints except that her urine is dark.   Pt denies any chest pain shortness of breath fever or chills.  Review of systems are otherwise negative   Past Medical History:  Diagnosis Date   Anxiety    Arthritis    Cancer ([place])    Depression    Diabetes mellitus without complication ([place])    Hyperlipemia    Hypertension    Thyroid disease    TIA (transient ischemic attack)    Vitamin B12 deficiency 10/20/2019   Past Surgical History:  Procedure Laterality Date   ABDOMINAL HYSTERECTOMY     BREAST BIOPSY     IR IMAGING GUIDED PORT INSERTION  09/22/2020   TUBAL LIGATION      Social History:  reports that she quit smoking about 33 years ago. Her smoking use included cigarettes. She has a 10.00 pack-year smoking history. She has never used smokeless tobacco. She reports current alcohol use of about 21.0 standard drinks of alcohol per week. She reports that she does not use drugs.   Allergies  Allergen Reactions   Penicillins Hives and Other (See Comments)    Has patient had a PCN reaction causing immediate rash, facial/tongue/throat swelling, SOB or lightheadedness with hypotension: No Has patient had a PCN reaction causing severe rash involving mucus membranes or skin necrosis: No Has patient had a PCN reaction  that required hospitalization No Has patient had a PCN reaction occurring within the last 10 years: No If all of the above answers are "NO", then may proceed with Cephalosporin use.    Family History  Problem Relation Age of Onset   Diabetes Mother    Alzheimer's disease Mother    Dementia Mother    COPD Father    Emphysema Father    Heart disease Sister    Alcohol abuse Brother    Heart disease Brother    Emphysema Brother    Heart attack Daughter    Cancer Other        breast cancer     Prior to Admission medications    Medication Sig Start Date End Date Taking? Authorizing Provider  ACCU-CHEK AVIVA PLUS test strip  08/11/19  Yes [provider]  Accu-Chek Softclix Lancets lancets  03/30/20  Yes [provider]  ALPRAZolam (XANAX) 0.5 MG tablet Take 1 tablet (0.5 mg total) by mouth daily as needed for anxiety. 09/14/20  Yes [name], MD  amLODipine (NORVASC) 5 MG tablet Take 1 tablet (5 mg total) by mouth daily. 09/14/20  Yes [name], MD  aspirin EC 81 MG tablet Take 81 mg by mouth daily.   Yes [provider]  Blood Glucose Monitoring Suppl (ACCU-CHEK GUIDE) w/Device KIT  10/14/19  Yes [provider]  cyanocobalamin (,VITAMIN B-12,) 1000 MCG/ML injection INJECT 1 ML EVERY 2 WEEKS 08/21/20  Yes [name], MD  docusate sodium (COLACE) 100 MG capsule Take 100 mg by mouth 2 (two) times daily.   Yes [provider]  levothyroxine (SYNTHROID) 125 MCG tablet TAKE 1 TABLET ON AN EMPTY STOMACH IN THE MORNING 09/14/20  Yes [name], MD  lidocaine-prilocaine (EMLA) cream Apply 1 application topically as needed. 09/13/20  Yes Truitt Merle, MD  omeprazole (PRILOSEC) 20 MG capsule Take 1 capsule (20 mg total) by mouth daily. 07/26/20  Yes [name], MD  ondansetron (ZOFRAN) 8 MG tablet Take 1 tablet (8 mg total) by mouth 2 (two) times daily as needed (Nausea or vomiting). 09/12/20  Yes Truitt Merle, MD  prochlorperazine (COMPAZINE) 10 MG tablet Take 1 tablet (10 mg total) by mouth every 6 (six) hours as needed (Nausea or vomiting). 09/12/20  Yes Truitt Merle, MD  HYDROcodone-acetaminophen (NORCO) 5-325 MG tablet Take 0.5-1 tablets by mouth every 6 (six) hours as needed for moderate pain. Patient not taking: No sig reported 09/20/20   Ladell Pier, MD  Polyethylene Glycol 3350 (MIRALAX PO) Take by mouth. Patient not taking: No sig reported    [provider]  rosuvastatin (CRESTOR) 5 MG tablet Take 1 tablet (5 mg total) by mouth 3 (three) times a  week. Patient not taking: No sig reported 01/28/20   [name], MD    Physical Exam: BP (!) 154/83   Pulse 92   Temp 98.7 F (37.1 C) (Oral)   Resp 16   SpO2 98%   Exam:  General: 75 y.o. year-old female well developed well nourished in no acute distress.  Alert and oriented x3. Cardiovascular: Regular rate and rhythm with no rubs or gallops.  No thyromegaly or JVD noted. Patient has a port on the right upper chest wall Respiratory: Clear to auscultation with no wheezes or rales. Good inspiratory effort. Abdomen: Soft n mild tender right upper quadrant nondistended with normal bowel sounds x4 quadrants. Musculoskeletal: No lower extremity edema. 2/4 pulses in all 4 extremities. Skin:  No ulcerative lesions noted or rashes, mildly jaundiced Psychiatry: Mood is appropriate for condition and setting           Labs on Admission:  Basic Metabolic Panel: Recent Labs  Lab 09/24/20 1029  NA 133*  K 3.8  CL 103  CO2 20*  GLUCOSE 186*  BUN 7*  CREATININE 0.69  CALCIUM 9.6   Liver Function Tests: Recent Labs  Lab 09/24/20 1029  AST 292*  ALT 437*  ALKPHOS 224*  BILITOT 6.7*  PROT 7.2  ALBUMIN 4.1   Recent Labs  Lab 09/24/20 1029  LIPASE 34   No results for input(s): AMMONIA in the last 168 hours. CBC: Recent Labs  Lab 09/24/20 1029  WBC 6.6  NEUTROABS 4.3  HGB 15.4*  HCT 45.3  MCV 90.6  PLT 160   Cardiac Enzymes: No results for input(s): CKTOTAL, CKMB, CKMBINDEX, TROPONINI in the last 168 hours.  BNP (last 3 results) No results for input(s): BNP in the last 8760 hours.  ProBNP (last 3 results) No results for input(s): PROBNP in the last 8760 hours.  CBG: Recent Labs  Lab 09/22/20 1157 09/24/20 1121  GLUCAP 170* 180*    Radiological Exams on Admission: CT ABDOMEN PELVIS W CONTRAST  Result Date: 09/24/2020 CLINICAL DATA:  Abdominal pain, recent diagnosis pancreatic cancer EXAM: CT ABDOMEN AND PELVIS WITH CONTRAST TECHNIQUE: Multidetector  CT imaging of the abdomen and pelvis was performed using the standard protocol following bolus administration of intravenous contrast. CONTRAST:  63m OMNIPAQUE IOHEXOL 350 MG/ML SOLN COMPARISON:  MR abdomen, 08/27/2020 FINDINGS: Lower chest: No acute abnormality. Numerous small pulmonary nodules in the included bilateral lung bases, nonspecific, for example a 5 mm nodule in the peripheral left lower lobe (series 4, image 15) and a 4 mm nodule in the dependent right lower lobe (series 4, image 17). Hepatobiliary: Multiple hypoenhancing masses of the liver parenchyma, not significant changed compared to prior MR. Contracted gallbladder. No gallstones or biliary dilatation. Pancreas: Redemonstrated hypoenhancing mass of the pancreatic neck and body, not significantly changed compared to prior examination (series 2, image 19). No pancreatic ductal dilatation or surrounding inflammatory changes. Spleen: Normal in size without significant abnormality. Adrenals/Urinary Tract: Adrenal glands are unremarkable. Kidneys are normal, without renal calculi, solid lesion, or hydronephrosis. Bladder is unremarkable. Stomach/Bowel: Stomach is within normal limits. Appendix appears normal. No evidence of bowel wall thickening, distention, or inflammatory changes. Sigmoid diverticulosis Vascular/Lymphatic: Aortic atherosclerosis. No enlarged abdominal or pelvic lymph nodes. Reproductive: Status post hysterectomy. Other: No abdominal wall hernia or abnormality. Omental and peritoneal stranding and nodularity, consistent with peritoneal metastatic disease, not significantly changed (series 2, image 31, 49, 40). No abdominopelvic ascites. Musculoskeletal: No acute or significant osseous findings. IMPRESSION: 1. No acute CT findings of the abdomen or pelvis to explain pain. 2. Redemonstrated hypoenhancing mass of the pancreatic neck and body, not significantly changed compared to prior examination , consistent with primary pancreatic  malignancy. 3. Omental and peritoneal stranding and nodularity, consistent with peritoneal metastatic disease, not significantly changed compared to prior examination. 4. Multiple hypoenhancing liver masses, consistent with hepatic metastatic disease, not significantly changed compared to prior examination. 5. Numerous small pulmonary nodules in the included bilateral lung bases, not well identified on prior MR although highly suspicious for pulmonary metastatic disease . Aortic Atherosclerosis (ICD10-I70.0). Electronically Signed   By: AEddie CandleM.D.   On: 09/24/2020 14:03    EKG: Independently reviewed.  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  Hyperbilirubinemia causing dark urine. Transaminitis due to pancreatic cancer Patient is being admitted for IV hydration  2.  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  CT scan is showing multiple dense echogenicity in the liver and the lungs the one in the lungs was not initially there.  3.  Type 2 diabetes mellitus with hyperglycemia Start Sliding scale insulin Continue IV hydration  4.  Essential hypertension uncontrolled restart her amlodipine continue aspirin  and continue her alprazolam  5.  Anxiety continue home dose of alprazolam 0.5 mg daily as needed  6.  Hypothyroidism resume levothyroxine 125 mcg daily  7.  Hyperlipidemia continue Crestor carb modified diet  8.  Glucosuria due to uncontrolled type 2 diabetes mellitus continue sliding scale  Severity of Illness: The appropriate patient status for this patient is OBSERVATION. Observation status is judged to be reasonable and necessary in order to provide the required intensity of service to ensure the patient's safety. The patient's presenting symptoms, physical exam findings, and initial radiographic and laboratory data in the context of their medical condition is felt to place them at decreased risk for further clinical deterioration. Furthermore, it is anticipated that the patient will be medically stable for discharge from the hospital within 2 midnights of admission. The following factors support the patient status of observation.   " Patient is needing IV hydration DVT prophylaxis: Lovenox GI prophylaxis with Protonix  Code Status: DNR  Family Communication: Daughter who is power of attorney Ms. [place]  Disposition Plan: Home after rehydration  Consults called: ED doc consulted with oncology  Admission status: Admit to observation    Cristal Deer MD [facility] Pager [phone]  If 7PM-7AM, please contact night-coverage [email or web address] Password [email or web address]  09/24/2020, 5:25 PM

## 2020-09-25 DIAGNOSIS — F32A Depression, unspecified: Secondary | ICD-10-CM | POA: Diagnosis present

## 2020-09-25 DIAGNOSIS — Z66 Do not resuscitate: Secondary | ICD-10-CM | POA: Diagnosis present

## 2020-09-25 DIAGNOSIS — C7889 Secondary malignant neoplasm of other digestive organs: Secondary | ICD-10-CM | POA: Diagnosis not present

## 2020-09-25 DIAGNOSIS — F411 Generalized anxiety disorder: Secondary | ICD-10-CM | POA: Diagnosis present

## 2020-09-25 DIAGNOSIS — F1010 Alcohol abuse, uncomplicated: Secondary | ICD-10-CM | POA: Diagnosis present

## 2020-09-25 DIAGNOSIS — E860 Dehydration: Secondary | ICD-10-CM | POA: Diagnosis present

## 2020-09-25 DIAGNOSIS — E1165 Type 2 diabetes mellitus with hyperglycemia: Secondary | ICD-10-CM | POA: Diagnosis present

## 2020-09-25 DIAGNOSIS — Z820 Family history of epilepsy and other diseases of the nervous system: Secondary | ICD-10-CM | POA: Diagnosis not present

## 2020-09-25 DIAGNOSIS — C787 Secondary malignant neoplasm of liver and intrahepatic bile duct: Secondary | ICD-10-CM | POA: Diagnosis present

## 2020-09-25 DIAGNOSIS — Z90710 Acquired absence of both cervix and uterus: Secondary | ICD-10-CM | POA: Diagnosis not present

## 2020-09-25 DIAGNOSIS — Z87891 Personal history of nicotine dependence: Secondary | ICD-10-CM | POA: Diagnosis not present

## 2020-09-25 DIAGNOSIS — K831 Obstruction of bile duct: Secondary | ICD-10-CM | POA: Diagnosis not present

## 2020-09-25 DIAGNOSIS — C786 Secondary malignant neoplasm of retroperitoneum and peritoneum: Secondary | ICD-10-CM | POA: Diagnosis present

## 2020-09-25 DIAGNOSIS — Z880 Allergy status to penicillin: Secondary | ICD-10-CM | POA: Diagnosis not present

## 2020-09-25 DIAGNOSIS — Z833 Family history of diabetes mellitus: Secondary | ICD-10-CM | POA: Diagnosis not present

## 2020-09-25 DIAGNOSIS — E782 Mixed hyperlipidemia: Secondary | ICD-10-CM | POA: Diagnosis present

## 2020-09-25 DIAGNOSIS — Z20822 Contact with and (suspected) exposure to covid-19: Secondary | ICD-10-CM | POA: Diagnosis present

## 2020-09-25 DIAGNOSIS — Z79899 Other long term (current) drug therapy: Secondary | ICD-10-CM | POA: Diagnosis not present

## 2020-09-25 DIAGNOSIS — C7800 Secondary malignant neoplasm of unspecified lung: Secondary | ICD-10-CM | POA: Diagnosis present

## 2020-09-25 DIAGNOSIS — R7989 Other specified abnormal findings of blood chemistry: Secondary | ICD-10-CM | POA: Diagnosis present

## 2020-09-25 DIAGNOSIS — Z7982 Long term (current) use of aspirin: Secondary | ICD-10-CM | POA: Diagnosis not present

## 2020-09-25 DIAGNOSIS — E039 Hypothyroidism, unspecified: Secondary | ICD-10-CM | POA: Diagnosis present

## 2020-09-25 DIAGNOSIS — Z8673 Personal history of transient ischemic attack (TIA), and cerebral infarction without residual deficits: Secondary | ICD-10-CM | POA: Diagnosis not present

## 2020-09-25 DIAGNOSIS — I10 Essential (primary) hypertension: Secondary | ICD-10-CM | POA: Diagnosis present

## 2020-09-25 DIAGNOSIS — C259 Malignant neoplasm of pancreas, unspecified: Secondary | ICD-10-CM | POA: Diagnosis present

## 2020-09-25 LAB — COMPREHENSIVE METABOLIC PANEL
ALT: 402 U/L — ABNORMAL HIGH (ref 0–44)
AST: 274 U/L — ABNORMAL HIGH (ref 15–41)
Albumin: 3.6 g/dL (ref 3.5–5.0)
Alkaline Phosphatase: 203 U/L — ABNORMAL HIGH (ref 38–126)
Anion gap: 9 (ref 5–15)
BUN: 7 mg/dL — ABNORMAL LOW (ref 8–23)
CO2: 19 mmol/L — ABNORMAL LOW (ref 22–32)
Calcium: 9.2 mg/dL (ref 8.9–10.3)
Chloride: 107 mmol/L (ref 98–111)
Creatinine, Ser: 0.54 mg/dL (ref 0.44–1.00)
GFR, Estimated: >60 mL/min (ref >60)
Glucose, Bld: 169 mg/dL — ABNORMAL HIGH (ref 70–99)
Potassium: 3.9 mmol/L (ref 3.5–5.1)
Sodium: 135 mmol/L (ref 135–145)
Total Bilirubin: 6.5 mg/dL — ABNORMAL HIGH (ref 0.3–1.2)
Total Protein: 6.3 g/dL — ABNORMAL LOW (ref 6.5–8.1)

## 2020-09-25 LAB — GLUCOSE, CAPILLARY
Glucose-Capillary: 135 mg/dL — ABNORMAL HIGH (ref 70–99)
Glucose-Capillary: 164 mg/dL — ABNORMAL HIGH (ref 70–99)
Glucose-Capillary: 177 mg/dL — ABNORMAL HIGH (ref 70–99)
Glucose-Capillary: 134 mg/dL — ABNORMAL HIGH (ref 70–99)

## 2020-09-25 LAB — CBC
HCT: 42.6 % (ref 36.0–46.0)
Hemoglobin: 14 g/dL (ref 12.0–15.0)
MCH: 30.6 pg (ref 26.0–34.0)
MCHC: 32.9 g/dL (ref 30.0–36.0)
MCV: 93 fL (ref 80.0–100.0)
Platelets: 141 10*3/uL — ABNORMAL LOW (ref 150–400)
RBC: 4.58 MIL/uL (ref 3.87–5.11)
RDW: 13.7 % (ref 11.5–15.5)
WBC: 4.7 10*3/uL (ref 4.0–10.5)
nRBC: 0 % (ref 0.0–0.2)

## 2020-09-25 LAB — HEMOGLOBIN A1C
Hgb A1c MFr Bld: 7.1 % — ABNORMAL HIGH (ref 4.8–5.6)
Mean Plasma Glucose: 157.07 mg/dL

## 2020-09-25 MED ORDER — HYDROCODONE-ACETAMINOPHEN 5-325 MG PO TABS
0.5000 | ORAL_TABLET | Freq: Four times a day (QID) | ORAL | Status: DC | PRN
  Administered 2020-09-27 – 2020-09-28 (×2): 1 via ORAL
  Filled 2020-09-25 (×3): qty 1

## 2020-09-25 MED ORDER — AMLODIPINE BESYLATE 5 MG PO TABS
5.0000 mg | ORAL_TABLET | Freq: Every day | ORAL | Status: DC
  Administered 2020-09-26 – 2020-09-28 (×3): 5 mg via ORAL
  Filled 2020-09-25 (×3): qty 1

## 2020-09-25 MED ORDER — ALPRAZOLAM 0.25 MG PO TABS
0.2500 mg | ORAL_TABLET | Freq: Two times a day (BID) | ORAL | Status: DC | PRN
  Administered 2020-09-26 – 2020-09-29 (×6): 0.25 mg via ORAL
  Filled 2020-09-25 (×6): qty 1

## 2020-09-25 MED ORDER — CHLORHEXIDINE GLUCONATE CLOTH 2 % EX PADS
6.0000 | MEDICATED_PAD | Freq: Every day | CUTANEOUS | Status: DC
  Administered 2020-09-26 – 2020-09-27 (×3): 6 via TOPICAL

## 2020-09-25 NOTE — Progress Notes (Signed)
Progress Note    [name]   KDX:[id number]  DOB: 1945/11/18  DOA: 09/24/2020     0  PCP: [name], MD  CC: weakness, dark urine  Hospital Course: [name] is a 75 yo female with PMH recent diagnosis of pancreatic cancer with metastasis to lung/liver, type 2 diabetes, anxiety, hypertension, hyperlipidemia, hypothyroidism who presented to the ER with dark urine. She has recently undergone Port-A-Cath placement on 09/22/2020 with plans for starting chemo on 09/27/2020. She has had poor appetite at home recently with ongoing weight loss.  She was considered to be dehydrated on admission and was admitted for IV fluids and further work-up.  Interval History:  Patient resting in bed this morning in no obvious distress.  No family bedside.  She mentioned being told she might get a stent when seen earlier this morning by oncology.  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 Annamaria Boots.  Tentative plan is for nab-paclitaxel, gemcitabine - ECOG 2 when seen in office recently - my concern would be for rapid decline after chemo started and/or not tolerated; if so, would suspect her prognosis and quality of life would worsen; family against palliative care consult at this time   Elevated LFTs - suspect mass effect from pancreatic lesion - speaks to guarded prognosis - follow up oncology eval; patient states she was told she might get a stent  Dehydration - continue IVF  GAD (generalized anxiety disorder) - continue xanax PRN  Acquired  hypothyroidism - Continue Synthroid  Mixed hyperlipidemia - Crestor is continued although mortality benefit may no longer be adequate  Essential hypertension - Continue home meds    Old records reviewed in assessment of this patient  Antimicrobials:   DVT prophylaxis: enoxaparin (LOVENOX) injection 40 mg Start: 09/24/20 2200 SCDs Start: 09/24/20 1925   Code Status:   Code Status: DNR Family Communication:   Disposition Plan: Status is: Observation  The patient will require care spanning > 2 midnights and should be moved to inpatient because: IV treatments appropriate due to intensity of illness or inability to take PO and Inpatient level of care appropriate due to severity of illness  Dispo: The patient is from: Home              Anticipated d/c is to: Home              Patient currently is not medically stable to d/c.   Difficult to place patient No  Risk of unplanned readmission score:     Objective: Blood pressure (!) 158/98, pulse 84, temperature 98.8 F (37.1 C), temperature source Oral, resp. rate 16, SpO2 99 %.  Examination: General appearance:  Pleasant elderly woman resting in bed in no distress Head: Normocephalic, without obvious abnormality, atraumatic Eyes:  EOMI Lungs: clear to auscultation bilaterally Heart: regular rate and rhythm and S1, S2 normal Abdomen: normal findings: bowel sounds normal and soft, non-tender Extremities:  No edema Skin: mobility and turgor normal Neurologic: Grossly normal  Consultants:  Oncology  Procedures:    Data Reviewed: I have personally reviewed following labs and imaging studies Results for orders placed or performed during the hospital  encounter of 09/24/20 (from the past 24 hour(s))  Glucose, capillary     Status: Abnormal   Collection Time: 09/24/20  9:48 PM  Result Value Ref Range   Glucose-Capillary 180 (H) 70 - 99 mg/dL   Comment 1 Notify RN    Comment 2 Document in Chart   Comprehensive metabolic  panel     Status: Abnormal   Collection Time: 09/25/20  5:11 AM  Result Value Ref Range   Sodium 135 135 - 145 mmol/L   Potassium 3.9 3.5 - 5.1 mmol/L   Chloride 107 98 - 111 mmol/L   CO2 19 (L) 22 - 32 mmol/L   Glucose, Bld 169 (H) 70 - 99 mg/dL   BUN 7 (L) 8 - 23 mg/dL   Creatinine, Ser 0.54 0.44 - 1.00 mg/dL   Calcium 9.2 8.9 - 10.3 mg/dL   Total Protein 6.3 (L) 6.5 - 8.1 g/dL   Albumin 3.6 3.5 - 5.0 g/dL   AST 274 (H) 15 - 41 U/L   ALT 402 (H) 0 - 44 U/L   Alkaline Phosphatase 203 (H) 38 - 126 U/L   Total Bilirubin 6.5 (H) 0.3 - 1.2 mg/dL   GFR, Estimated >60 >60 mL/min   Anion gap 9 5 - 15  CBC     Status: Abnormal   Collection Time: 09/25/20  5:11 AM  Result Value Ref Range   WBC 4.7 4.0 - 10.5 K/uL   RBC 4.58 3.87 - 5.11 MIL/uL   Hemoglobin 14.0 12.0 - 15.0 g/dL   HCT 42.6 36.0 - 46.0 %   MCV 93.0 80.0 - 100.0 fL   MCH 30.6 26.0 - 34.0 pg   MCHC 32.9 30.0 - 36.0 g/dL   RDW 13.7 11.5 - 15.5 %   Platelets 141 (L) 150 - 400 K/uL   nRBC 0.0 0.0 - 0.2 %  Hemoglobin A1c     Status: Abnormal   Collection Time: 09/25/20  5:11 AM  Result Value Ref Range   Hgb A1c MFr Bld 7.1 (H) 4.8 - 5.6 %   Mean Plasma Glucose 157.07 mg/dL  Glucose, capillary     Status: Abnormal   Collection Time: 09/25/20  7:32 AM  Result Value Ref Range   Glucose-Capillary 135 (H) 70 - 99 mg/dL   Comment 1 Notify RN    Comment 2 Document in Chart   Glucose, capillary     Status: Abnormal   Collection Time: 09/25/20 12:10 PM  Result Value Ref Range   Glucose-Capillary 164 (H) 70 - 99 mg/dL   Comment 1 Notify RN    Comment 2 Document in Chart     No results found for this or any previous visit (from the past 240 hour(s)).   Radiology Studies: CT ABDOMEN PELVIS W CONTRAST  Result Date: 09/24/2020 CLINICAL DATA:  Abdominal pain, recent diagnosis pancreatic cancer EXAM: CT ABDOMEN AND PELVIS WITH CONTRAST TECHNIQUE: Multidetector CT imaging of the abdomen and pelvis was performed using the  standard protocol following bolus administration of intravenous contrast. CONTRAST:  87mL OMNIPAQUE IOHEXOL 350 MG/ML SOLN COMPARISON:  MR abdomen, 08/27/2020 FINDINGS: Lower chest: No acute abnormality. Numerous small pulmonary nodules in the included bilateral lung bases, nonspecific, for example a 5 mm nodule in the peripheral left lower lobe (series 4, image 15) and a 4 mm nodule in the dependent right lower lobe (series 4, image 17). Hepatobiliary: Multiple hypoenhancing masses of the liver parenchyma, not significant changed compared to prior MR. Contracted gallbladder. No gallstones  or biliary dilatation. Pancreas: Redemonstrated hypoenhancing mass of the pancreatic neck and body, not significantly changed compared to prior examination (series 2, image 19). No pancreatic ductal dilatation or surrounding inflammatory changes. Spleen: Normal in size without significant abnormality. Adrenals/Urinary Tract: Adrenal glands are unremarkable. Kidneys are normal, without renal calculi, solid lesion, or hydronephrosis. Bladder is unremarkable. Stomach/Bowel: Stomach is within normal limits. Appendix appears normal. No evidence of bowel wall thickening, distention, or inflammatory changes. Sigmoid diverticulosis Vascular/Lymphatic: Aortic atherosclerosis. No enlarged abdominal or pelvic lymph nodes. Reproductive: Status post hysterectomy. Other: No abdominal wall hernia or abnormality. Omental and peritoneal stranding and nodularity, consistent with peritoneal metastatic disease, not significantly changed (series 2, image 31, 49, 40). No abdominopelvic ascites. Musculoskeletal: No acute or significant osseous findings. IMPRESSION: 1. No acute CT findings of the abdomen or pelvis to explain pain. 2. Redemonstrated hypoenhancing mass of the pancreatic neck and body, not significantly changed compared to prior examination , consistent with primary pancreatic malignancy. 3. Omental and peritoneal stranding and nodularity,  consistent with peritoneal metastatic disease, not significantly changed compared to prior examination. 4. Multiple hypoenhancing liver masses, consistent with hepatic metastatic disease, not significantly changed compared to prior examination. 5. Numerous small pulmonary nodules in the included bilateral lung bases, not well identified on prior MR although highly suspicious for pulmonary metastatic disease . Aortic Atherosclerosis (ICD10-I70.0). Electronically Signed   By: Eddie Candle M.D.   On: 09/24/2020 14:03   CT ABDOMEN PELVIS W CONTRAST  Final Result      Scheduled Meds:  amLODipine  5 mg Oral Daily   aspirin EC  81 mg Oral Daily   docusate sodium  100 mg Oral BID   enoxaparin (LOVENOX) injection  40 mg Subcutaneous Q24H   insulin aspart  0-6 Units Subcutaneous TID WC   levothyroxine  125 mcg Oral Q0600   pantoprazole  40 mg Oral Daily   PRN Meds: ALPRAZolam, ondansetron, prochlorperazine Continuous Infusions:  sodium chloride 100 mL/hr at 09/25/20 0549     LOS: 0 days  Time spent: Greater than 50% of the 35 minute visit was spent in counseling/coordination of care for the patient as laid out in the A&P.   Dwyane Dee, MD [facility] 09/25/2020, 1:00 PM

## 2020-09-25 NOTE — Assessment & Plan Note (Signed)
Continue Synthroid °

## 2020-09-25 NOTE — Hospital Course (Signed)
[name] is a 75 yo female with PMH recent diagnosis of pancreatic cancer with metastasis to lung/liver, type 2 diabetes, anxiety, hypertension, hyperlipidemia, hypothyroidism who presented to the ER with dark urine. She has recently undergone Port-A-Cath placement on 09/22/2020 with plans for starting chemo on 09/27/2020. She has had poor appetite at home recently with ongoing weight loss.  She was considered to be dehydrated on admission and was admitted for IV fluids and further work-up.

## 2020-09-25 NOTE — Assessment & Plan Note (Addendum)
-   Differential includes mass effect from pancreatic lesion vs synthetic dysfunction of the liver due to infiltrative masses - check PT/INR - speaks to guarded prognosis - oncology and GI following - plan is for MRCP on 7/12 to further investigate if biliary dilation/obstruction can be seen - I had a long discussion with daughter and patient bedside regarding her diagnosis and image findings thus far; patient has a lot of denial and anger around her diagnosis but after discussion she at least started to show some acceptance but this will require ongoing conversations; she is still against palliative care involvement but I feel this is imminent in the near future

## 2020-09-25 NOTE — Assessment & Plan Note (Signed)
-   continue IVF

## 2020-09-25 NOTE — Progress Notes (Signed)
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  She is scheduled to begin gemcitabine/Abraxane this week.  She underwent Port-A-Cath placement on 09/22/2020. [name] developed dark urine and "weakness ".  She presented to the emergency room yesterday.  She continues to have back pain.  She has not started hydrocodone that was prescribed last week.  Objective: Vital signs in last 24 hours: Blood pressure (!) 158/98, pulse 84, temperature 98.8 F (37.1 C), temperature source Oral, resp. rate 16, SpO2 99 %.  Intake/Output from previous day: 07/10 0701 - 07/11 0700 In: 1246.4 [P.O.:240; I.V.:1006.4] Out: 950 [Urine:950]  Physical Exam:  HEENT: Scleral icterus Lungs: Clear bilaterally Cardiac: Regular rate and rhythm Abdomen: No hepatosplenomegaly, no mass, nontender Extremities: No leg edema Skin: Jaundice  Portacath/PICC-without erythema  Lab Results: Recent Labs    09/24/20 1029 09/25/20 0511  WBC 6.6 4.7  HGB 15.4* 14.0  HCT 45.3 42.6  PLT 160 141*    BMET Recent Labs    09/24/20 1029 09/25/20 0511  NA 133* 135  K 3.8 3.9  CL 103 107  CO2 20* 19*  GLUCOSE 186* 169*  BUN 7* 7*  CREATININE 0.69 0.54  CALCIUM 9.6 9.2    Lab Results  Component Value Date   CAN199 4,951 (H) 08/31/2020    Studies/Results: CT ABDOMEN PELVIS W CONTRAST  Result Date: 09/24/2020 CLINICAL DATA:  Abdominal pain, recent diagnosis pancreatic cancer EXAM: CT ABDOMEN AND PELVIS WITH CONTRAST TECHNIQUE: Multidetector CT imaging of the abdomen and pelvis was performed using the standard protocol following bolus administration of intravenous contrast. CONTRAST:  38mL OMNIPAQUE IOHEXOL 350 MG/ML SOLN COMPARISON:  MR abdomen, 08/27/2020 FINDINGS: Lower chest: No acute abnormality. Numerous small pulmonary nodules in the included bilateral lung bases, nonspecific, for example a 5 mm nodule in the peripheral left lower lobe (series 4, image  15) and a 4 mm nodule in the dependent right lower lobe (series 4, image 17). Hepatobiliary: Multiple hypoenhancing masses of the liver parenchyma, not significant changed compared to prior MR. Contracted gallbladder. No gallstones or biliary dilatation. Pancreas: Redemonstrated hypoenhancing mass of the pancreatic neck and body, not significantly changed compared to prior examination (series 2, image 19). No pancreatic ductal dilatation or surrounding inflammatory changes. Spleen: Normal in size without significant abnormality. Adrenals/Urinary Tract: Adrenal glands are unremarkable. Kidneys are normal, without renal calculi, solid lesion, or hydronephrosis. Bladder is unremarkable. Stomach/Bowel: Stomach is within normal limits. Appendix appears normal. No evidence of bowel wall thickening, distention, or inflammatory changes. Sigmoid diverticulosis Vascular/Lymphatic: Aortic atherosclerosis. No enlarged abdominal or pelvic lymph nodes. Reproductive: Status post hysterectomy. Other: No abdominal wall hernia or abnormality. Omental and peritoneal stranding and nodularity, consistent with peritoneal metastatic disease, not significantly changed (series 2, image 31, 49, 40). No abdominopelvic ascites. Musculoskeletal: No acute or significant osseous findings. IMPRESSION: 1. No acute CT findings of the abdomen or pelvis to explain pain. 2. Redemonstrated hypoenhancing mass of the pancreatic neck and body, not significantly changed compared to prior examination , consistent with primary pancreatic malignancy. 3. Omental and peritoneal stranding and nodularity, consistent with peritoneal metastatic disease, not significantly changed compared to prior examination. 4. Multiple hypoenhancing liver masses, consistent with hepatic metastatic disease, not significantly changed compared to prior examination. 5. Numerous small pulmonary nodules in the included bilateral lung bases, not well identified on prior MR although  highly suspicious for pulmonary metastatic disease . Aortic Atherosclerosis (ICD10-I70.0). Electronically Signed   By: Eddie Candle  M.D.   On: 09/24/2020 14:03    Medications: I have reviewed the patient's current medications.  Assessment/Plan: Adenocarcinoma the pancreas body, stage IV (cT4,cN0,pM1) Ultrasound abdomen 08/18/2020-possible hypoechoic pancreas body mass, small hypoechoic liver areas MRI abdomen 08/27/2020-pancreas body mass, multiple hepatic lesions consistent with metastases, right abdominal omental nodularity, pancreas mass extends to the celiac bifurcation and abuts the splenic vein and splenoportal confluence CT chest 09/07/2020-scattered pulmonary nodules concerning for metastases, pancreas body mass, hepatic metastases Ultrasound-guided biopsy of a right liver lesion 09/08/2020-adenocarcinoma consistent with a pancreas primary CT abdomen/pelvis 09/24/2020-pancreas neck mass, omental and peritoneal nodularity-unchanged, multiple hypoenhancing liver masses-unchanged, numerous small pulmonary nodules   2.  Pain secondary #1 3.  Diabetes 4.  Hypertension 5.  Osteoarthritis 6.  Port-A-Cath placement 09/22/2020 7.  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  She is scheduled to begin gemcitabine/Abraxane chemotherapy later this week.  She was admitted yesterday with new onset jaundice.  The biliary obstruction is acute.  I suspect bile duct obstruction by tumor despite the lack of biliary dilation on the CT.  She does not appear to have a significant hepatic tumor burden to explain the elevated bilirubin.  Recommendations: GI consult to consider the indication for an ERCP Plan for chemotherapy will be placed on hold until the hyperbilirubinemia improves Hydrocodone as needed for pain   LOS: 0 days   Betsy Coder, MD   09/25/2020, 1:47 PM

## 2020-09-25 NOTE — Assessment & Plan Note (Signed)
-   continue xanax PRN

## 2020-09-25 NOTE — Assessment & Plan Note (Addendum)
-   On Crestor at home although mortality benefit likely inadequate - On hold in setting of elevated liver enzymes

## 2020-09-25 NOTE — Consult Note (Signed)
Referring Provider:  [place] Primary Care Physician:  [name], MD Primary Gastroenterologist:  Dr. Edwards/[facility]  Reason for Consultation: Abnormal LFTs, metastatic pancreatic cancer  HPI: [name] is a 75 y.o. female who was recently diagnosed with metastatic pancreatic cancer with mets to liver as well as vascular involvement with tumor admitted to the hospital with further evaluation of dark urine.  Patient was initially seen by primary care physician in early June for evaluation of right-sided abdominal pain.  Ultrasound abdomen showed possible pancreatic body mass along with possible liver lesions.  She was seen by oncology.  Underwent MRI abdomen on August 27, 2020 which showed 5.9 cm infiltrative pancreatic body mass with multiple liver mets and possible peritoneal metastasis.  Underwent liver biopsy on September 08, 2020 with pathology showing adenocarcinoma with pancreatic primary.  Presented to the hospital with dark urine and weakness.  Initial work-up showed abnormal LFTs with T bili 6.7, alkaline phosphatase 224, ALT 437 and AST 292.  LFTs were normal as of August 31, 2020.  CT abdomen pelvis with contrast yesterday showed showed similar finding compared to prior MRI with additional finding of numerous small pulmonary nodules.  No gallstones.  No biliary dilation.  No pancreatic ductal dilation.  GI is consulted for further evaluation for possible ERCP for stent placement.  Patient seen and examined at bedside.  She continues to have intermittent right upper quadrant abdominal pain and nausea.  Denies any vomiting.  Had some constipation in the past but now having loose stools.  Denies any blood in the stool or black stool.  Denies any fever.  Past Medical History:  Diagnosis Date   Anxiety    Arthritis    Cancer ([place])    Depression    Diabetes mellitus without complication ([place])    Hyperlipemia    Hypertension    Thyroid disease    TIA (transient ischemic attack)    Vitamin  B12 deficiency 10/20/2019    Past Surgical History:  Procedure Laterality Date   ABDOMINAL HYSTERECTOMY     BREAST BIOPSY     IR IMAGING GUIDED PORT INSERTION  09/22/2020   TUBAL LIGATION      Prior to Admission medications   Medication Sig Start Date End Date Taking? Authorizing Provider  ACCU-CHEK AVIVA PLUS test strip  08/11/19  Yes [provider]  Accu-Chek Softclix Lancets lancets  03/30/20  Yes [provider]  ALPRAZolam (XANAX) 0.5 MG tablet Take 1 tablet (0.5 mg total) by mouth daily as needed for anxiety. 09/14/20  Yes [name], MD  amLODipine (NORVASC) 5 MG tablet Take 1 tablet (5 mg total) by mouth daily. 09/14/20  Yes [name], MD  aspirin EC 81 MG tablet Take 81 mg by mouth daily.   Yes [provider]  Blood Glucose Monitoring Suppl (ACCU-CHEK GUIDE) w/Device KIT  10/14/19  Yes [provider]  cyanocobalamin (,VITAMIN B-12,) 1000 MCG/ML injection INJECT 1 ML EVERY 2 WEEKS 08/21/20  Yes [name], MD  docusate sodium (COLACE) 100 MG capsule Take 100 mg by mouth 2 (two) times daily.   Yes [provider]  levothyroxine (SYNTHROID) 125 MCG tablet TAKE 1 TABLET ON AN EMPTY STOMACH IN THE MORNING 09/14/20  Yes [name], MD  lidocaine-prilocaine (EMLA) cream Apply 1 application topically as needed. 09/13/20  Yes Truitt Merle, MD  omeprazole (PRILOSEC) 20 MG capsule Take 1 capsule (20 mg total) by mouth daily. 07/26/20  Yes [name],  MD  ondansetron (ZOFRAN) 8 MG tablet Take 1 tablet (8 mg total) by mouth 2 (two) times daily as needed (Nausea or vomiting). 09/12/20  Yes Truitt Merle, MD  prochlorperazine (COMPAZINE) 10 MG tablet Take 1 tablet (10 mg total) by mouth every 6 (six) hours as needed (Nausea or vomiting). 09/12/20  Yes Truitt Merle, MD  HYDROcodone-acetaminophen (NORCO) 5-325 MG tablet Take 0.5-1 tablets by mouth every 6 (six) hours as needed for moderate pain. Patient not taking: No sig reported 09/20/20    Ladell Pier, MD  Polyethylene Glycol 3350 (MIRALAX PO) Take by mouth. Patient not taking: No sig reported    [provider]  rosuvastatin (CRESTOR) 5 MG tablet Take 1 tablet (5 mg total) by mouth 3 (three) times a week. Patient not taking: No sig reported 01/28/20   [name], MD    Scheduled Meds:  amLODipine  5 mg Oral Daily   aspirin EC  81 mg Oral Daily   docusate sodium  100 mg Oral BID   enoxaparin (LOVENOX) injection  40 mg Subcutaneous Q24H   insulin aspart  0-6 Units Subcutaneous TID WC   levothyroxine  125 mcg Oral Q0600   pantoprazole  40 mg Oral Daily   Continuous Infusions:  sodium chloride 75 mL/hr at 09/25/20 1421   PRN Meds:.ALPRAZolam, HYDROcodone-acetaminophen, ondansetron, prochlorperazine  Allergies as of 09/24/2020 - Review Complete 09/24/2020  Allergen Reaction Noted   Penicillins Hives and Other (See Comments) 05/23/2012    Family History  Problem Relation Age of Onset   Diabetes Mother    Alzheimer's disease Mother    Dementia Mother    COPD Father    Emphysema Father    Heart disease Sister    Alcohol abuse Brother    Heart disease Brother    Emphysema Brother    Heart attack Daughter    Cancer Other        breast cancer    Social History   Socioeconomic History   Marital status: Married    Spouse name: Not on file   Number of children: Not on file   Years of education: Not on file   Highest education level: Not on file  Occupational History   Occupation: retired    Comment: Geophysicist/field seismologist   Occupation: caregiver  Tobacco Use   Smoking status: Former    Packs/day: 1.00    Years: 10.00    Pack years: 10.00    Types: Cigarettes    Quit date: 05/25/1987    Years since quitting: 33.3   Smokeless tobacco: Never  Vaping Use   Vaping Use: Never used  Substance and Sexual Activity   Alcohol use: Yes    Alcohol/week: 21.0 standard drinks    Types: 21 Glasses of wine per week    Comment: 3 glasses of wine daily, she  stopped in 08/2020   Drug use: No   Sexual activity: Not Currently    Birth control/protection: Post-menopausal  Other Topics Concern   Not on file  Social History Narrative   Not on file   Social Determinants of Health   Financial Resource Strain: Low Risk    Difficulty of Paying Living Expenses: Not hard at all  Food Insecurity: No Food Insecurity   Worried About Charity fundraiser in the Last Year: Never true   [place] in the Last Year: Never true  Transportation Needs: No Transportation Needs   Lack of Transportation (Medical): No   Lack  of Transportation (Non-Medical): No  Physical Activity: Insufficiently Active   Days of Exercise per Week: 5 days   Minutes of Exercise per Session: 20 min  Stress: Stress Concern Present   Feeling of Stress : To some extent  Social Connections: Moderately Integrated   Frequency of Communication with Friends and Family: More than three times a week   Frequency of Social Gatherings with Friends and Family: Twice a week   Attends Religious Services: Never   Marine scientist or Organizations: Yes   Attends Archivist Meetings: Never   Marital Status: Married  Human resources officer Violence: Not At Risk   Fear of Current or Ex-Partner: No   Emotionally Abused: No   Physically Abused: No   Sexually Abused: No    Review of Systems: All negative except as stated above in HPI.  Physical Exam: Vital signs: Vitals:   09/25/20 0005 09/25/20 0941  BP: (!) 143/89 (!) 158/98  Pulse: 97 84  Resp: 17 16  Temp: 98.2 F (36.8 C) 98.8 F (37.1 C)  SpO2: 97% 99%   Last BM Date: 09/24/20 General:   Alert,  Well-developed, well-nourished, pleasant and cooperative in NAD HEENT : Scleral icterus noted, extraocular movement intact. Lungs: No visible respiratory distress Heart:  Regular rate and rhythm; no murmurs, clicks, rubs,  or gallops. Abdomen: Mild right upper quadrant discomfort on palpation, no peritoneal signs, bowel  sounds present. Rectal:  Deferred  GI:  Lab Results: Recent Labs    09/24/20 1029 09/25/20 0511  WBC 6.6 4.7  HGB 15.4* 14.0  HCT 45.3 42.6  PLT 160 141*   BMET Recent Labs    09/24/20 1029 09/25/20 0511  NA 133* 135  K 3.8 3.9  CL 103 107  CO2 20* 19*  GLUCOSE 186* 169*  BUN 7* 7*  CREATININE 0.69 0.54  CALCIUM 9.6 9.2   LFT Recent Labs    09/25/20 0511  PROT 6.3*  ALBUMIN 3.6  AST 274*  ALT 402*  ALKPHOS 203*  BILITOT 6.5*   PT/INR No results for input(s): LABPROT, INR in the last 72 hours.   Studies/Results: CT ABDOMEN PELVIS W CONTRAST  Result Date: 09/24/2020 CLINICAL DATA:  Abdominal pain, recent diagnosis pancreatic cancer EXAM: CT ABDOMEN AND PELVIS WITH CONTRAST TECHNIQUE: Multidetector CT imaging of the abdomen and pelvis was performed using the standard protocol following bolus administration of intravenous contrast. CONTRAST:  3m OMNIPAQUE IOHEXOL 350 MG/ML SOLN COMPARISON:  MR abdomen, 08/27/2020 FINDINGS: Lower chest: No acute abnormality. Numerous small pulmonary nodules in the included bilateral lung bases, nonspecific, for example a 5 mm nodule in the peripheral left lower lobe (series 4, image 15) and a 4 mm nodule in the dependent right lower lobe (series 4, image 17). Hepatobiliary: Multiple hypoenhancing masses of the liver parenchyma, not significant changed compared to prior MR. Contracted gallbladder. No gallstones or biliary dilatation. Pancreas: Redemonstrated hypoenhancing mass of the pancreatic neck and body, not significantly changed compared to prior examination (series 2, image 19). No pancreatic ductal dilatation or surrounding inflammatory changes. Spleen: Normal in size without significant abnormality. Adrenals/Urinary Tract: Adrenal glands are unremarkable. Kidneys are normal, without renal calculi, solid lesion, or hydronephrosis. Bladder is unremarkable. Stomach/Bowel: Stomach is within normal limits. Appendix appears normal. No  evidence of bowel wall thickening, distention, or inflammatory changes. Sigmoid diverticulosis Vascular/Lymphatic: Aortic atherosclerosis. No enlarged abdominal or pelvic lymph nodes. Reproductive: Status post hysterectomy. Other: No abdominal wall hernia or abnormality. Omental and peritoneal stranding and nodularity,  consistent with peritoneal metastatic disease, not significantly changed (series 2, image 31, 49, 40). No abdominopelvic ascites. Musculoskeletal: No acute or significant osseous findings. IMPRESSION: 1. No acute CT findings of the abdomen or pelvis to explain pain. 2. Redemonstrated hypoenhancing mass of the pancreatic neck and body, not significantly changed compared to prior examination , consistent with primary pancreatic malignancy. 3. Omental and peritoneal stranding and nodularity, consistent with peritoneal metastatic disease, not significantly changed compared to prior examination. 4. Multiple hypoenhancing liver masses, consistent with hepatic metastatic disease, not significantly changed compared to prior examination. 5. Numerous small pulmonary nodules in the included bilateral lung bases, not well identified on prior MR although highly suspicious for pulmonary metastatic disease . Aortic Atherosclerosis (ICD10-I70.0). Electronically Signed   By: Eddie Candle M.D.   On: 09/24/2020 14:03    Impression/Plan: -Abnormal LFTs with jaundice in a patient with metastatic pancreatic cancer with multiple hepatic metastasis along with possible peritoneal as well as pulmonary mets.   Recommendations ------------------------ - CT scan yesterday showed no biliary ductal dilation or no PD dilation.  Abnormal LFTs could be from hepatic metastasis disease   -I will discuss with Dr. Paulita Fujita to see if she will benefit from ERCP with possible stent placement.   -Continue supportive care   LOS: 0 days   Otis Brace  MD, FACP 09/25/2020, 2:50 PM  Contact #  [phone]

## 2020-09-25 NOTE — Assessment & Plan Note (Addendum)
-   s/p liver biopsy 09/08/20 showing adenocarcinoma consistent with primary pancreatic malignancy - CT A/P 09/24/20 shows mets to peritoneum, liver, lungs -Established outpatient with Dr. Annamaria Boots.  Tentative plan is for nab-paclitaxel, gemcitabine (plan still TBD) - ECOG 2 when seen in office recently but I'm concerned she may start to decline soon if LFTs worsen - my concern would be for rapid decline after chemo started and/or not tolerated; she may still decline prior to chemo starting

## 2020-09-25 NOTE — Progress Notes (Signed)
Daughter, Langston Reusing called and asked to speak to Charge Nurse about her mother. She was upset that the oncologist did not call her regarding her mother's care. Patient concerned that nothing can be done to treat her mother's condition and is ready to transfer her. Anytime a doctor comes in the room, call Ms. Jeneen Rinks. Informed her that Oncologist usually shows up around 0630, informed her that I would allow her to come in. Her information is on the chart for phone number. Some information given to patient on notes from doctor. Informed her that this information is not set in stone and she will need to obtain clear and concise information from the physician. Informed daughter as well that we would keep her mother comfortable with anxiety information and will page the physician, if needed. Ms. Jeneen Rinks also spoke with main nurse about her mother and what care is being done at the moment. Ms. Jeneen Rinks contact number is [phone].

## 2020-09-25 NOTE — Assessment & Plan Note (Signed)
-   Continue home meds °

## 2020-09-26 ENCOUNTER — Encounter (HOSPITAL_COMMUNITY): Payer: Self-pay | Admitting: Internal Medicine

## 2020-09-26 ENCOUNTER — Encounter: Payer: Medicare HMO | Admitting: Dietician

## 2020-09-26 ENCOUNTER — Inpatient Hospital Stay (HOSPITAL_COMMUNITY): Payer: Medicare HMO

## 2020-09-26 DIAGNOSIS — C259 Malignant neoplasm of pancreas, unspecified: Secondary | ICD-10-CM | POA: Diagnosis not present

## 2020-09-26 LAB — CBC WITH DIFFERENTIAL/PLATELET
Abs Immature Granulocytes: 0.02 10*3/uL (ref 0.00–0.07)
Basophils Absolute: 0 10*3/uL (ref 0.0–0.1)
Basophils Relative: 0 %
Eosinophils Absolute: 0.1 10*3/uL (ref 0.0–0.5)
Eosinophils Relative: 1 %
HCT: 42.7 % (ref 36.0–46.0)
Hemoglobin: 14.1 g/dL (ref 12.0–15.0)
Immature Granulocytes: 0 %
Lymphocytes Relative: 31 %
Lymphs Abs: 1.5 10*3/uL (ref 0.7–4.0)
MCH: 30.6 pg (ref 26.0–34.0)
MCHC: 33 g/dL (ref 30.0–36.0)
MCV: 92.6 fL (ref 80.0–100.0)
Monocytes Absolute: 0.4 10*3/uL (ref 0.1–1.0)
Monocytes Relative: 8 %
Neutro Abs: 2.8 10*3/uL (ref 1.7–7.7)
Neutrophils Relative %: 60 %
Platelets: 130 10*3/uL — ABNORMAL LOW (ref 150–400)
RBC: 4.61 MIL/uL (ref 3.87–5.11)
RDW: 13.8 % (ref 11.5–15.5)
WBC: 4.8 10*3/uL (ref 4.0–10.5)
nRBC: 0 % (ref 0.0–0.2)

## 2020-09-26 LAB — COMPREHENSIVE METABOLIC PANEL
ALT: 438 U/L — ABNORMAL HIGH (ref 0–44)
AST: 260 U/L — ABNORMAL HIGH (ref 15–41)
Albumin: 3.7 g/dL (ref 3.5–5.0)
Alkaline Phosphatase: 228 U/L — ABNORMAL HIGH (ref 38–126)
Anion gap: 10 (ref 5–15)
BUN: 7 mg/dL — ABNORMAL LOW (ref 8–23)
CO2: 21 mmol/L — ABNORMAL LOW (ref 22–32)
Calcium: 9.3 mg/dL (ref 8.9–10.3)
Chloride: 105 mmol/L (ref 98–111)
Creatinine, Ser: 0.56 mg/dL (ref 0.44–1.00)
GFR, Estimated: >60 mL/min (ref >60)
Glucose, Bld: 156 mg/dL — ABNORMAL HIGH (ref 70–99)
Potassium: 3.7 mmol/L (ref 3.5–5.1)
Sodium: 136 mmol/L (ref 135–145)
Total Bilirubin: 8.5 mg/dL — ABNORMAL HIGH (ref 0.3–1.2)
Total Protein: 6.7 g/dL (ref 6.5–8.1)

## 2020-09-26 LAB — GLUCOSE, CAPILLARY
Glucose-Capillary: 145 mg/dL — ABNORMAL HIGH (ref 70–99)
Glucose-Capillary: 149 mg/dL — ABNORMAL HIGH (ref 70–99)
Glucose-Capillary: 115 mg/dL — ABNORMAL HIGH (ref 70–99)
Glucose-Capillary: 203 mg/dL — ABNORMAL HIGH (ref 70–99)

## 2020-09-26 LAB — MAGNESIUM: Magnesium: 1.7 mg/dL (ref 1.7–2.4)

## 2020-09-26 MED ORDER — LORAZEPAM 2 MG/ML IJ SOLN
1.0000 mg | Freq: Once | INTRAMUSCULAR | Status: DC | PRN
  Filled 2020-09-26: qty 1

## 2020-09-26 MED ORDER — GADOBUTROL 1 MMOL/ML IV SOLN
8.0000 mL | Freq: Once | INTRAVENOUS | Status: AC | PRN
  Administered 2020-09-26: 8 mL via INTRAVENOUS

## 2020-09-26 MED ORDER — ALUM & MAG HYDROXIDE-SIMETH 200-200-20 MG/5ML PO SUSP
30.0000 mL | ORAL | Status: DC | PRN
  Administered 2020-09-26: 30 mL via ORAL
  Filled 2020-09-26: qty 30

## 2020-09-26 MED ORDER — LORAZEPAM 2 MG/ML IJ SOLN: 1.0000 mg | Freq: Once | INTRAMUSCULAR | Status: DC

## 2020-09-26 NOTE — Progress Notes (Signed)
Pharmacist Chemotherapy Monitoring - Initial Assessment    Anticipated start date: 10/11/20   The following has been reviewed per standard work regarding the patient's treatment regimen: The patient's diagnosis, treatment plan and drug doses, and organ/hematologic function Lab orders and baseline tests specific to treatment regimen  The treatment plan start date, drug sequencing, and pre-medications Prior authorization status  Patient's documented medication list, including drug-drug interaction screen and prescriptions for anti-emetics and supportive care specific to the treatment regimen The drug concentrations, fluid compatibility, administration routes, and timing of the medications to be used The patient's access for treatment and lifetime cumulative dose history, if applicable  The patient's medication allergies and previous infusion related reactions, if applicable   Changes made to treatment plan:  N/A  Follow up needed:  N/A   [name], [facility], 09/26/2020  2:33 PM

## 2020-09-26 NOTE — Evaluation (Signed)
Physical Therapy Evaluation Patient Details Name: [name] MRN: [id number] DOB: Jan 27, 1946 Today's Date: 09/26/2020   History of Present Illness  75 yo female admitted with metastatic pancreatic ca, hyperbilirubinemia, back pain  Clinical Impression  On eval, pt was Min guard assist for mobility. She walked ~350 feet around the unit with a RW. Pt is frustrated with not being able to mobilize more. Unfortunately, pt is on a purewick which tends to limit her activity. Recommend daily ambulation in hallway with nursing supervision to increase activity (or at least ambulate to/from bathroom when needed), in addition to PT sessions. Will plan to follow and progress activity as tolerated.     Follow Up Recommendations Home health PT;Supervision/Assistance - 24 hour    Equipment Recommendations  Rolling walker with 5" wheels    Recommendations for Other Services       Precautions / Restrictions Precautions Precautions: Fall Restrictions Weight Bearing Restrictions: No      Mobility  Bed Mobility               General bed mobility comments: oob in recliner    Transfers Overall transfer level: Needs assistance   Transfers: Sit to/from Stand Sit to Stand: Min guard         General transfer comment: Min guard for safety. Cues for safety, hand placement  Ambulation/Gait Ambulation/Gait assistance: Min guard Gait Distance (Feet): 350 Feet Assistive device: Rolling walker (2 wheeled) Gait Pattern/deviations: Step-through pattern;Decreased stride length     General Gait Details: Min guard for safety. 2 attempts to rise. Unsteady. Pt tolerated distance well.  Stairs            Wheelchair Mobility    Modified Rankin (Stroke Patients Only)       Balance Overall balance assessment: Mild deficits observed, not formally tested                                           Pertinent Vitals/Pain Pain Assessment: Faces Faces Pain Scale: No  hurt    Home Living Family/patient expects to be discharged to:: Private residence Living Arrangements: Spouse/significant other Available Help at Discharge: Family;Available 24 hours/day Type of Home: House Home Access: Stairs to enter Entrance Stairs-Rails: None Entrance Stairs-Number of Steps: 2 Home Layout: One level Home Equipment: None      Prior Function Level of Independence: Independent               Hand Dominance        Extremity/Trunk Assessment   Upper Extremity Assessment Upper Extremity Assessment: Generalized weakness    Lower Extremity Assessment Lower Extremity Assessment: Generalized weakness    Cervical / Trunk Assessment Cervical / Trunk Assessment: Normal  Communication   Communication: No difficulties  Cognition Arousal/Alertness: Awake/alert Behavior During Therapy: WFL for tasks assessed/performed Overall Cognitive Status: Within Functional Limits for tasks assessed                                        General Comments      Exercises General Exercises - Lower Extremity Long Arc Quad: AROM;Both;15 reps Hip Flexion/Marching: Seated;15 reps;Both;AROM   Assessment/Plan    PT Assessment Patient needs continued PT services  PT Problem List Decreased strength;Decreased mobility;Decreased activity tolerance;Decreased balance;Decreased knowledge of use of DME  PT Treatment Interventions DME instruction;Gait training;Therapeutic exercise;Balance training;Functional mobility training;Therapeutic activities;Patient/family education    PT Goals (Current goals can be found in the Care Plan section)  Acute Rehab PT Goals Patient Stated Goal: "move more to help my arthritis" PT Goal Formulation: With patient/family Time For Goal Achievement: 10/10/20 Potential to Achieve Goals: Good    Frequency Min 3X/week   Barriers to discharge        Co-evaluation               AM-PAC PT "6 Clicks" Mobility   Outcome Measure Help needed turning from your back to your side while in a flat bed without using bedrails?: A Little Help needed moving from lying on your back to sitting on the side of a flat bed without using bedrails?: A Little Help needed moving to and from a bed to a chair (including a wheelchair)?: A Little Help needed standing up from a chair using your arms (e.g., wheelchair or bedside chair)?: A Little Help needed to walk in hospital room?: A Little Help needed climbing 3-5 steps with a railing? : A Little 6 Click Score: 18    End of Session Equipment Utilized During Treatment: Gait belt Activity Tolerance: Patient tolerated treatment well Patient left: in chair;with call bell/phone within reach;with family/visitor present   PT Visit Diagnosis: Difficulty in walking, not elsewhere classified (R26.2);Muscle weakness (generalized) (M62.81)    Time: 4136-4383 PT Time Calculation (min) (ACUTE ONLY): 26 min   Charges:   PT Evaluation $PT Eval Moderate Complexity: 1 Mod PT Treatments $Gait Training: 8-22 mins           Doreatha Massed, PT Acute Rehabilitation  Office: [phone] Pager: [phone]

## 2020-09-26 NOTE — Progress Notes (Addendum)
IP PROGRESS NOTE  Subjective:   [name] continues to have back pain.  She thinks that lying in the bed is making her back pain worse.  She would like to get out of bed.  She continues to have dark urine.  Objective: Vital signs in last 24 hours: Blood pressure (!) 151/89, pulse 84, temperature 98.5 F (36.9 C), temperature source Oral, resp. rate 19, height 5\' 5"  (1.651 m), weight 77.4 kg, SpO2 97 %.  Intake/Output from previous day: 07/11 0701 - 07/12 0700 In: 2075.2 [P.O.:264; I.V.:1811.2] Out: 2401 [Urine:2400; Stool:1]  Physical Exam:  HEENT: Scleral icterus Lungs: Clear bilaterally Cardiac: Regular rate and rhythm Abdomen: No hepatosplenomegaly, no mass, nontender Extremities: No leg edema Skin: Jaundice  Portacath/PICC-without erythema  Lab Results: Recent Labs    09/25/20 0511 09/26/20 0508  WBC 4.7 4.8  HGB 14.0 14.1  HCT 42.6 42.7  PLT 141* 130*    BMET Recent Labs    09/25/20 0511 09/26/20 0508  NA 135 136  K 3.9 3.7  CL 107 105  CO2 19* 21*  GLUCOSE 169* 156*  BUN 7* 7*  CREATININE 0.54 0.56  CALCIUM 9.2 9.3    Lab Results  Component Value Date   CAN199 4,951 (H) 08/31/2020    Studies/Results: CT ABDOMEN PELVIS W CONTRAST  Result Date: 09/24/2020 CLINICAL DATA:  Abdominal pain, recent diagnosis pancreatic cancer EXAM: CT ABDOMEN AND PELVIS WITH CONTRAST TECHNIQUE: Multidetector CT imaging of the abdomen and pelvis was performed using the standard protocol following bolus administration of intravenous contrast. CONTRAST:  71mL OMNIPAQUE IOHEXOL 350 MG/ML SOLN COMPARISON:  MR abdomen, 08/27/2020 FINDINGS: Lower chest: No acute abnormality. Numerous small pulmonary nodules in the included bilateral lung bases, nonspecific, for example a 5 mm nodule in the peripheral left lower lobe (series 4, image 15) and a 4 mm nodule in the dependent right lower lobe (series 4, image 17). Hepatobiliary: Multiple hypoenhancing masses of the liver parenchyma,  not significant changed compared to prior MR. Contracted gallbladder. No gallstones or biliary dilatation. Pancreas: Redemonstrated hypoenhancing mass of the pancreatic neck and body, not significantly changed compared to prior examination (series 2, image 19). No pancreatic ductal dilatation or surrounding inflammatory changes. Spleen: Normal in size without significant abnormality. Adrenals/Urinary Tract: Adrenal glands are unremarkable. Kidneys are normal, without renal calculi, solid lesion, or hydronephrosis. Bladder is unremarkable. Stomach/Bowel: Stomach is within normal limits. Appendix appears normal. No evidence of bowel wall thickening, distention, or inflammatory changes. Sigmoid diverticulosis Vascular/Lymphatic: Aortic atherosclerosis. No enlarged abdominal or pelvic lymph nodes. Reproductive: Status post hysterectomy. Other: No abdominal wall hernia or abnormality. Omental and peritoneal stranding and nodularity, consistent with peritoneal metastatic disease, not significantly changed (series 2, image 31, 49, 40). No abdominopelvic ascites. Musculoskeletal: No acute or significant osseous findings. IMPRESSION: 1. No acute CT findings of the abdomen or pelvis to explain pain. 2. Redemonstrated hypoenhancing mass of the pancreatic neck and body, not significantly changed compared to prior examination , consistent with primary pancreatic malignancy. 3. Omental and peritoneal stranding and nodularity, consistent with peritoneal metastatic disease, not significantly changed compared to prior examination. 4. Multiple hypoenhancing liver masses, consistent with hepatic metastatic disease, not significantly changed compared to prior examination. 5. Numerous small pulmonary nodules in the included bilateral lung bases, not well identified on prior MR although highly suspicious for pulmonary metastatic disease . Aortic Atherosclerosis (ICD10-I70.0). Electronically Signed   By: Eddie Candle M.D.   On: 09/24/2020  14:03    Medications: I have reviewed  the patient's current medications.  Assessment/Plan: Adenocarcinoma the pancreas body, stage IV (cT4,cN0,pM1) Ultrasound abdomen 08/18/2020-possible hypoechoic pancreas body mass, small hypoechoic liver areas MRI abdomen 08/27/2020-pancreas body mass, multiple hepatic lesions consistent with metastases, right abdominal omental nodularity, pancreas mass extends to the celiac bifurcation and abuts the splenic vein and splenoportal confluence CT chest 09/07/2020-scattered pulmonary nodules concerning for metastases, pancreas body mass, hepatic metastases Ultrasound-guided biopsy of a right liver lesion 09/08/2020-adenocarcinoma consistent with a pancreas primary CT abdomen/pelvis 09/24/2020-pancreas neck mass, omental and peritoneal nodularity-unchanged, multiple hypoenhancing liver masses-unchanged, numerous small pulmonary nodules   2.  Pain secondary #1 3.  Diabetes 4.  Hypertension 5.  Osteoarthritis 6.  Port-A-Cath placement 09/22/2020 7.  Admission with jaundice 09/24/2020  [name] appears unchanged.  She has a new diagnosis of metastatic pancreatic cancer and was scheduled to begin gemcitabine/Abraxane later this week.  She continues to have jaundice with an elevated T bili.  She has not have significant hepatic tumor burden so this is unlikely to explain the elevated bilirubin.  Suspect bile duct obstruction by tumor despite lack of biliary dilation on CT.  I have discussed this with GI who is seeing the patient.  They plan for MRCP to further evaluate.  Recommendations: GI planning for MRCP.  May need ERCP depending on results. Plan for chemotherapy will be placed on hold until the hyperbilirubinemia improves Hydrocodone as needed for pain Recommend out of bed to chair.  PT consult has been ordered.   LOS: 1 day   [name], NP   09/26/2020, 8:01 AM [name] was interviewed and examined.  She complains of lower back pain today, potentially  related to chronic spine disease versus pancreas cancer.  I recommended she use hydrocodone as needed.  The bilirubin remains markedly elevated.  She will undergo an MRCP for further evaluation.  I discussed the current status, diagnostic plan, and prognosis with her daughter by telephone.  I was present for greater than 50% of today's visit.  I performed medical decision making.  Toni Manson, MD

## 2020-09-26 NOTE — Progress Notes (Signed)
Progress Note    [name]   IOE:[id number]  DOB: [birth year]  DOA: 09/24/2020     1  PCP: Toni Arnt, MD  CC: weakness, dark urine  Hospital Course: [name] is a 75 yo female with PMH recent diagnosis of pancreatic cancer with metastasis to lung/liver, type 2 diabetes, anxiety, hypertension, hyperlipidemia, hypothyroidism who presented to the ER with dark urine. She has recently undergone Port-A-Cath placement on 09/22/2020 with plans for starting chemo on 09/27/2020. She has had poor appetite at home recently with ongoing weight loss.  She was considered to be dehydrated on admission and was admitted for IV fluids and further work-up.  Interval History:  Patient and daughter were upset this morning over her overall plan.  Went and spoke with both in person in their room and clarified several questions and reviewed image studies once again. I believe both are having difficulty with acceptance and adjustment with diagnosis.  This is not unexpected and there have been evolving changes on her imaging studies even recently. Ultimately they would benefit from palliative care discussions and for some counseling but patient is not quite ready at this time but I think that we will change eventually (she had a bad palliative care experience with a loved one years ago and is associating it poorly unfortunately).   After our discussion, they seemed more calm and were in understanding of the plan which for now is MRCP to see if any biliary dilatation and evidence of obstruction which would be amenable for stenting hopefully here and not requiring a transfer given wait lists at surrounding academic centers.  ROS: Constitutional: positive for fatigue and malaise, negative for chills and fevers, Respiratory: negative for cough, Cardiovascular: negative for chest pain, and Gastrointestinal: negative for abdominal pain  Assessment & Plan: * Primary pancreatic cancer with metastasis to  other site [facility]) - s/p liver biopsy 09/08/20 showing adenocarcinoma consistent with primary pancreatic malignancy - CT A/P 09/24/20 shows mets to peritoneum, liver, lungs -Established outpatient with [name].  Tentative plan is for nab-paclitaxel, gemcitabine (plan still TBD) - ECOG 2 when seen in office recently but I'm concerned she may start to decline soon if LFTs worsen - my concern would be for rapid decline after chemo started and/or not tolerated; she may still decline prior to chemo starting   Elevated LFTs - Differential includes mass effect from pancreatic lesion vs synthetic dysfunction of the liver due to infiltrative masses - check PT/INR - speaks to guarded prognosis - oncology and GI following - plan is for MRCP on 7/12 to further investigate if biliary dilation/obstruction can be seen - I had a long discussion with daughter and patient bedside regarding her diagnosis and image findings thus far; patient has a lot of denial and anger around her diagnosis but after discussion she at least started to show some acceptance but this will require ongoing conversations; she is still against palliative care involvement but I feel this is imminent in the near future   Dehydration - continue IVF  GAD (generalized anxiety disorder) - continue xanax PRN  Acquired hypothyroidism - Continue Synthroid  Mixed hyperlipidemia - On Crestor at home although mortality benefit likely inadequate - On hold in setting of elevated liver enzymes  Essential hypertension - Continue home meds   Old records reviewed in assessment of this patient  Antimicrobials:   DVT prophylaxis: enoxaparin (LOVENOX) injection 40 mg Start: 09/24/20 2200 SCDs Start: 09/24/20 1925   Code Status:   Code  Status: DNR Family Communication:   Disposition Plan: Status is: Inpatient  Remains inpatient appropriate because:Ongoing diagnostic testing needed not appropriate for outpatient work up, IV treatments  appropriate due to intensity of illness or inability to take PO, and Inpatient level of care appropriate due to severity of illness  Dispo: The patient is from: Home              Anticipated d/c is to: Home              Patient currently is not medically stable to d/c.   Difficult to place patient No  Risk of unplanned readmission score: Unplanned Admission- Pilot do not use: 13.26   Objective: Blood pressure (!) 151/89, pulse 84, temperature 98.5 F (36.9 C), temperature source Oral, resp. rate 19, height 5\' 5"  (1.651 m), weight 77.4 kg, SpO2 97 %.  Examination: General appearance:  Pleasant elderly woman resting in bed in no distress Head: Normocephalic, without obvious abnormality, atraumatic Eyes:  EOMI Lungs: clear to auscultation bilaterally Heart: regular rate and rhythm and S1, S2 normal Abdomen: normal findings: bowel sounds normal and soft, non-tender Extremities:  No edema Skin: mobility and turgor normal Neurologic: Grossly normal  Consultants:  Oncology  Procedures:    Data Reviewed: I have personally reviewed following labs and imaging studies Results for orders placed or performed during the hospital encounter of 09/24/20 (from the past 24 hour(s))  Glucose, capillary     Status: Abnormal   Collection Time: 09/25/20  5:25 PM  Result Value Ref Range   Glucose-Capillary 177 (H) 70 - 99 mg/dL   Comment 1 Notify RN    Comment 2 Document in Chart   Glucose, capillary     Status: Abnormal   Collection Time: 09/25/20  9:39 PM  Result Value Ref Range   Glucose-Capillary 134 (H) 70 - 99 mg/dL  Comprehensive metabolic panel     Status: Abnormal   Collection Time: 09/26/20  5:08 AM  Result Value Ref Range   Sodium 136 135 - 145 mmol/L   Potassium 3.7 3.5 - 5.1 mmol/L   Chloride 105 98 - 111 mmol/L   CO2 21 (L) 22 - 32 mmol/L   Glucose, Bld 156 (H) 70 - 99 mg/dL   BUN 7 (L) 8 - 23 mg/dL   Creatinine, Ser 0.56 0.44 - 1.00 mg/dL   Calcium 9.3 8.9 - 10.3 mg/dL    Total Protein 6.7 6.5 - 8.1 g/dL   Albumin 3.7 3.5 - 5.0 g/dL   AST 260 (H) 15 - 41 U/L   ALT 438 (H) 0 - 44 U/L   Alkaline Phosphatase 228 (H) 38 - 126 U/L   Total Bilirubin 8.5 (H) 0.3 - 1.2 mg/dL   GFR, Estimated >60 >60 mL/min   Anion gap 10 5 - 15  CBC with Differential/Platelet     Status: Abnormal   Collection Time: 09/26/20  5:08 AM  Result Value Ref Range   WBC 4.8 4.0 - 10.5 K/uL   RBC 4.61 3.87 - 5.11 MIL/uL   Hemoglobin 14.1 12.0 - 15.0 g/dL   HCT 42.7 36.0 - 46.0 %   MCV 92.6 80.0 - 100.0 fL   MCH 30.6 26.0 - 34.0 pg   MCHC 33.0 30.0 - 36.0 g/dL   RDW 13.8 11.5 - 15.5 %   Platelets 130 (L) 150 - 400 K/uL   nRBC 0.0 0.0 - 0.2 %   Neutrophils Relative % 60 %   Neutro Abs 2.8 1.7 -  7.7 K/uL   Lymphocytes Relative 31 %   Lymphs Abs 1.5 0.7 - 4.0 K/uL   Monocytes Relative 8 %   Monocytes Absolute 0.4 0.1 - 1.0 K/uL   Eosinophils Relative 1 %   Eosinophils Absolute 0.1 0.0 - 0.5 K/uL   Basophils Relative 0 %   Basophils Absolute 0.0 0.0 - 0.1 K/uL   Immature Granulocytes 0 %   Abs Immature Granulocytes 0.02 0.00 - 0.07 K/uL  Magnesium     Status: None   Collection Time: 09/26/20  5:08 AM  Result Value Ref Range   Magnesium 1.7 1.7 - 2.4 mg/dL  Glucose, capillary     Status: Abnormal   Collection Time: 09/26/20  7:48 AM  Result Value Ref Range   Glucose-Capillary 145 (H) 70 - 99 mg/dL   Comment 1 Notify RN    Comment 2 Document in Chart   Glucose, capillary     Status: Abnormal   Collection Time: 09/26/20 11:55 AM  Result Value Ref Range   Glucose-Capillary 149 (H) 70 - 99 mg/dL   Comment 1 Notify RN    Comment 2 Document in Chart     No results found for this or any previous visit (from the past 240 hour(s)).   Radiology Studies: No results found. CT ABDOMEN PELVIS W CONTRAST  Final Result    MR ABDOMEN MRCP W WO CONTAST    (Results Pending)    Scheduled Meds:  amLODipine  5 mg Oral QHS   aspirin EC  81 mg Oral Daily   Chlorhexidine Gluconate  Cloth  6 each Topical Daily   docusate sodium  100 mg Oral BID   enoxaparin (LOVENOX) injection  40 mg Subcutaneous Q24H   insulin aspart  0-6 Units Subcutaneous TID WC   levothyroxine  125 mcg Oral Q0600   pantoprazole  40 mg Oral Daily   PRN Meds: ALPRAZolam, HYDROcodone-acetaminophen, LORazepam, ondansetron, prochlorperazine Continuous Infusions:  sodium chloride 75 mL/hr at 09/25/20 2351     LOS: 1 day  Time spent: Greater than 50% of the 35 minute visit was spent in counseling/coordination of care for the patient as laid out in the A&P.   Dwyane Dee, MD [facility] 09/26/2020, 2:16 PM

## 2020-09-26 NOTE — Progress Notes (Signed)
[facility] Progress Note  [name] 75 y.o. June 09, 1945  CC:  Abnormal LFTs, metastatic pancreatic cancer  Subjective: Patient states she is feeling fine this morning.  She tolerated breakfast but has a little bit of post-prandial diffuse abdominal pain.  Denies nausea/vomiting.  ROS : Review of Systems  Cardiovascular:  Negative for chest pain and palpitations.  Gastrointestinal:  Positive for abdominal pain. Negative for blood in stool, constipation, diarrhea, heartburn, melena, nausea and vomiting.   Objective: Vital signs in last 24 hours: Vitals:   09/25/20 1520 09/25/20 2140  BP: (!) 170/99 (!) 151/89  Pulse: 95 84  Resp: 18 19  Temp: 97.8 F (36.6 C) 98.5 F (36.9 C)  SpO2: 100% 97%    Physical Exam:  General:  Alert, cooperative, no distress  Head:  Normocephalic, without obvious abnormality, atraumatic  Eyes:  Scleral icterus, EOMs intact  Lungs:   Clear to auscultation bilaterally, respirations unlabored  Heart:  Regular rate and rhythm, S1, S2 normal  Abdomen:   Soft, non-tender, non-distended, bowel sounds active all four quadrants  Extremities: Extremities normal, atraumatic, no  edema    Lab Results: Recent Labs    09/25/20 0511 09/26/20 0508  NA 135 136  K 3.9 3.7  CL 107 105  CO2 19* 21*  GLUCOSE 169* 156*  BUN 7* 7*  CREATININE 0.54 0.56  CALCIUM 9.2 9.3  MG  --  1.7   Recent Labs    09/25/20 0511 09/26/20 0508  AST 274* 260*  ALT 402* 438*  ALKPHOS 203* 228*  BILITOT 6.5* 8.5*  PROT 6.3* 6.7  ALBUMIN 3.6 3.7   Recent Labs    09/24/20 1029 09/25/20 0511 09/26/20 0508  WBC 6.6 4.7 4.8  NEUTROABS 4.3  --  2.8  HGB 15.4* 14.0 14.1  HCT 45.3 42.6 42.7  MCV 90.6 93.0 92.6  PLT 160 141* 130*   No results for input(s): LABPROT, INR in the last 72 hours.  Assessment: Abnormal LFTs, metastatic pancreatic cancer: possibly obstructive jaundice, though no biliary dilation present.  Hepatic mets not changed from  prior, less likely to be source of abnormal LFTs. -T.bili 8.5, increased from 6.5 yesterday -AST 260/ ALT 438/ ALP 228 -CT 09/24/20: Redemonstrated hypoenhancing mass of the pancreatic neck and body, not significantly changed compared to prior examination.  Multiple hypoenhancing liver masses, consistent with hepatic metastatic disease, not significantly changed compared to prior examination.  No biliary dilation.  Plan: MRI/MRCP for further evaluation.  NPO until after MRI/MRCP, then OK to resume diet.  Continue to trend LFTs.  Continue supportive care.  [facility] will follow.  Salley Slaughter PA-C 09/26/2020, 9:55 AM  Contact #  [phone]

## 2020-09-27 ENCOUNTER — Other Ambulatory Visit: Payer: Medicare HMO

## 2020-09-27 ENCOUNTER — Ambulatory Visit: Payer: Medicare HMO

## 2020-09-27 ENCOUNTER — Ambulatory Visit: Payer: Medicare HMO | Admitting: Oncology

## 2020-09-27 ENCOUNTER — Ambulatory Visit: Payer: Medicare HMO | Admitting: Nurse Practitioner

## 2020-09-27 ENCOUNTER — Inpatient Hospital Stay: Payer: Medicare HMO

## 2020-09-27 LAB — COMPREHENSIVE METABOLIC PANEL
ALT: 356 U/L — ABNORMAL HIGH (ref 0–44)
AST: 189 U/L — ABNORMAL HIGH (ref 15–41)
Albumin: 3.7 g/dL (ref 3.5–5.0)
Alkaline Phosphatase: 232 U/L — ABNORMAL HIGH (ref 38–126)
Anion gap: 10 (ref 5–15)
BUN: 9 mg/dL (ref 8–23)
CO2: 20 mmol/L — ABNORMAL LOW (ref 22–32)
Calcium: 9.4 mg/dL (ref 8.9–10.3)
Chloride: 107 mmol/L (ref 98–111)
Creatinine, Ser: 0.53 mg/dL (ref 0.44–1.00)
GFR, Estimated: >60 mL/min (ref >60)
Glucose, Bld: 161 mg/dL — ABNORMAL HIGH (ref 70–99)
Potassium: 3.5 mmol/L (ref 3.5–5.1)
Sodium: 137 mmol/L (ref 135–145)
Total Bilirubin: 9.4 mg/dL — ABNORMAL HIGH (ref 0.3–1.2)
Total Protein: 6.7 g/dL (ref 6.5–8.1)

## 2020-09-27 LAB — CBC WITH DIFFERENTIAL/PLATELET
Abs Immature Granulocytes: 0.02 10*3/uL (ref 0.00–0.07)
Basophils Absolute: 0 10*3/uL (ref 0.0–0.1)
Basophils Relative: 1 %
Eosinophils Absolute: 0.1 10*3/uL (ref 0.0–0.5)
Eosinophils Relative: 2 %
HCT: 43.2 % (ref 36.0–46.0)
Hemoglobin: 14.1 g/dL (ref 12.0–15.0)
Immature Granulocytes: 1 %
Lymphocytes Relative: 30 %
Lymphs Abs: 1.3 10*3/uL (ref 0.7–4.0)
MCH: 30.5 pg (ref 26.0–34.0)
MCHC: 32.6 g/dL (ref 30.0–36.0)
MCV: 93.3 fL (ref 80.0–100.0)
Monocytes Absolute: 0.4 10*3/uL (ref 0.1–1.0)
Monocytes Relative: 9 %
Neutro Abs: 2.6 10*3/uL (ref 1.7–7.7)
Neutrophils Relative %: 57 %
Platelets: 134 10*3/uL — ABNORMAL LOW (ref 150–400)
RBC: 4.63 MIL/uL (ref 3.87–5.11)
RDW: 13.9 % (ref 11.5–15.5)
WBC: 4.4 10*3/uL (ref 4.0–10.5)
nRBC: 0 % (ref 0.0–0.2)

## 2020-09-27 LAB — GLUCOSE, CAPILLARY
Glucose-Capillary: 135 mg/dL — ABNORMAL HIGH (ref 70–99)
Glucose-Capillary: 139 mg/dL — ABNORMAL HIGH (ref 70–99)
Glucose-Capillary: 172 mg/dL — ABNORMAL HIGH (ref 70–99)
Glucose-Capillary: 135 mg/dL — ABNORMAL HIGH (ref 70–99)

## 2020-09-27 LAB — PROTIME-INR
INR: 1 (ref 0.8–1.2)
Prothrombin Time: 12.9 seconds (ref 11.4–15.2)

## 2020-09-27 LAB — MAGNESIUM: Magnesium: 1.9 mg/dL (ref 1.7–2.4)

## 2020-09-27 MED ORDER — SODIUM CHLORIDE 0.9 % IV SOLN
2.0000 g | INTRAVENOUS | Status: DC
  Filled 2020-09-27: qty 2

## 2020-09-27 NOTE — Progress Notes (Signed)
Pharmacy Antibiotic Note  [name] is a 75 y.o. female admitted on 09/24/2020 with surgical prophylaxis prior to ERCP 09/28/20.  Pharmacy has been consulted for Cefepime dosing.  Plan: Cefepime 2g IV x1 prior to surgery  Height: 5\' 5"  (165.1 cm) Weight: 77.4 kg (170 lb 10.2 oz) IBW/kg (Calculated) : 57  Temp (24hrs), Avg:98.5 F (36.9 C), Min:98 F (36.7 C), Max:99.1 F (37.3 C)  Recent Labs  Lab 09/24/20 1029 09/25/20 0511 09/26/20 0508 09/27/20 0517  WBC 6.6 4.7 4.8 4.4  CREATININE 0.69 0.54 0.56 0.53    Estimated Creatinine Clearance: 60.6 mL/min (by C-G formula based on SCr of 0.53 mg/dL).    Allergies  Allergen Reactions   Penicillins Hives and Other (See Comments)    Has patient had a PCN reaction causing immediate rash, facial/tongue/throat swelling, SOB or lightheadedness with hypotension: No Has patient had a PCN reaction causing severe rash involving mucus membranes or skin necrosis: No Has patient had a PCN reaction that required hospitalization No Has patient had a PCN reaction occurring within the last 10 years: No If all of the above answers are "NO", then may proceed with Cephalosporin use.    Thank you for allowing pharmacy to be a part of this patient's care. Pharmacy will sign off at this time.   Dimple Nanas, PharmD 09/27/2020 1:57 PM

## 2020-09-27 NOTE — Progress Notes (Signed)
[facility] Progress Note  [name] 75 y.o. 1945/12/28  CC:  Abnormal LFTs, metastatic pancreatic cancer  Subjective: Patient states she is feeling fine this morning.  Denies abdominal pain, nausea/vomiting.  ROS : Review of Systems  Cardiovascular:  Negative for chest pain and palpitations.  Gastrointestinal:  Negative for abdominal pain, blood in stool, constipation, diarrhea, heartburn, melena, nausea and vomiting.   Objective: Vital signs in last 24 hours: Vitals:   09/26/20 2156 09/27/20 0450  BP: (!) 161/95 133/89  Pulse: 81 85  Resp: 16 16  Temp: 99.1 F (37.3 C) 98.5 F (36.9 C)  SpO2: 98% 98%    Physical Exam:  General:  Alert, cooperative, no distress; jaundiced  Head:  Normocephalic, without obvious abnormality, atraumatic  Eyes:  Scleral icterus, EOMs intact  Lungs:   Clear to auscultation bilaterally, respirations unlabored  Heart:  Regular rate and rhythm, S1, S2 normal  Abdomen:   Soft, non-tender, non-distended, bowel sounds active all four quadrants  Extremities: Extremities normal, atraumatic, no  edema    Lab Results: Recent Labs    09/26/20 0508 09/27/20 0517  NA 136 137  K 3.7 3.5  CL 105 107  CO2 21* 20*  GLUCOSE 156* 161*  BUN 7* 9  CREATININE 0.56 0.53  CALCIUM 9.3 9.4  MG 1.7 1.9    Recent Labs    09/26/20 0508 09/27/20 0517  AST 260* 189*  ALT 438* 356*  ALKPHOS 228* 232*  BILITOT 8.5* 9.4*  PROT 6.7 6.7  ALBUMIN 3.7 3.7    Recent Labs    09/26/20 0508 09/27/20 0517  WBC 4.8 4.4  NEUTROABS 2.8 2.6  HGB 14.1 14.1  HCT 42.7 43.2  MCV 92.6 93.3  PLT 130* 134*    Recent Labs    09/27/20 0517  LABPROT 12.9  INR 1.0    Assessment: Biliary obstruction due to pancreatic cancer: MRI/MRCP 09/26/20: New abrupt constriction of the common hepatic duct. The infiltrative pancreatic mass has extending medially in the porta hepatis to obstruct the common hepatic duct. Increase in intrahepatic and  extrahepatic biliary duct dilatation explains patient's jaundice. -T.bili 9.4, increased from 8.5 yesterday -AST 186/ ALT 356/ ALP 232  Plan: ERCP tomorrow with Dr. Watt Climes.  I thoroughly discussed the procedure with the patient to include nature, alternatives, benefits, and risks (including but not limited to bleeding, infection, perforation, post ERCP pancreatitis, anesthesia/cardiac and pulmonary complications).  Also discussed the possibility that access/stent placement may not be possible.  Patient verbalized understanding and gave verbal consent to proceed with ERCP.    NPO at midnight.  Continue to trend LFTs.  Continue supportive care.  [facility] will follow.  Salley Slaughter PA-C 09/27/2020, 11:08 AM  Contact #  [phone]

## 2020-09-27 NOTE — Progress Notes (Signed)
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 She has recently undergone Port-A-Cath placement on 09/22/2020 with plans for starting chemo on 09/27/2020. She has had poor appetite at home recently with ongoing weight loss.  She was considered to be dehydrated on admission and was admitted for IV fluids and further work-up.  Assessment & Plan:   Principal Problem:   Primary pancreatic cancer with metastasis to other site [facility]) Active Problems:   Essential hypertension   Mixed hyperlipidemia   Acquired hypothyroidism   GAD (generalized anxiety disorder)   Elevated LFTs   Dehydration   Pancreatic adenocarcinoma ([name])    #metastatic pancreatic cancer with obstructive jaundice-status post liver biopsy 09/08/2020 adenocarcinoma consistent with primary pancreatic malignancy.  CT abdomen and pelvis 09/24/2020 shows mets to peritoneum liver and lungs. MRCP 09/26/2020 with abrupt constriction of common hepatic duct with intra and extrahepatic biliary dilatation. Total bilirubin 9.4 from 8.5 from 6.5. AST ALT 189 and 356. GI planning for ERCP with possible stent placement 09/28/2020.  #Elevated LFTs likely secondary to mets.  #Hypothyroidism continue Synthroid  #Generalized anxiety disorder Xanax as needed  #Hyperlipidemia was on Crestor at home on hold due to elevated LFTs  #Hypertension continue current medications  Estimated body mass index is 28.4 kg/m as calculated from the following:   Height as of this encounter: 5\' 5"  (1.651 m).   Weight as of this encounter: 77.4 kg.  DVT prophylaxis: Lovenox  code Status: DNR Family Communication: Family at bedside Disposition Plan:  Status is:  Inpatient  Remains inpatient appropriate because:IV treatments appropriate due to intensity of illness or inability to take PO  Dispo: The patient is from: Home              Anticipated d/c is to: Home              Patient currently is not medically stable to d/c.   Difficult to place patient No       Consultants:  Oncology, GI  Procedures: None Antimicrobials:   Subjective:  She is resting in bed she denies any new complaints no nausea vomiting Objective: Vitals:   09/26/20 0707 09/26/20 1539 09/26/20 2156 09/27/20 0450  BP:  (!) 156/95 (!) 161/95 133/89  Pulse:  89 81 85  Resp:  15 16 16   Temp:  98 F (36.7 C) 99.1 F (37.3 C) 98.5 F (36.9 C)  TempSrc:  Oral Oral Oral  SpO2:  98% 98% 98%  Weight: 77.4 kg     Height: 5\' 5"  (1.651 m)       Intake/Output Summary (Last 24 hours) at 09/27/2020 1427 Last data filed at 09/27/2020 1018 Gross per 24 hour  Intake 120 ml  Output 2300 ml  Net -2180 ml   Filed Weights   09/26/20 0707  Weight: 77.4 kg    Examination:  General exam: Appears calm and comfortable  Respiratory system: Clear to auscultation. Respiratory effort normal. Cardiovascular system: S1 & S2 heard, RRR. No JVD, murmurs, rubs, gallops or clicks. No pedal edema. Gastrointestinal system: Abdomen is nondistended, soft and mild tender right upper quadrant no organomegaly or masses felt. Normal bowel sounds heard. Central nervous system: Alert and oriented. No focal neurological deficits. Extremities: Symmetric 5 x 5 power.  Skin: No rashes, lesions or ulcers Psychiatry: Judgement and insight appear normal. Mood & affect appropriate.     Data Reviewed: I have personally reviewed following labs and imaging studies  CBC: Recent Labs  Lab 09/24/20 1029 09/25/20 0511 09/26/20 0508 09/27/20 0517  WBC 6.6 4.7 4.8 4.4  NEUTROABS 4.3  --  2.8 2.6  HGB 15.4* 14.0 14.1 14.1  HCT 45.3 42.6 42.7 43.2  MCV 90.6 93.0 92.6 93.3  PLT 160 141* 130* 134*    Basic Metabolic Panel: Recent Labs  Lab 09/24/20 1029 09/25/20 0511 09/26/20 0508 09/27/20 0517  NA 133* 135 136 137  K 3.8 3.9 3.7 3.5  CL 103 107 105 107  CO2 20* 19* 21* 20*  GLUCOSE 186* 169* 156* 161*  BUN 7* 7* 7* 9  CREATININE 0.69 0.54 0.56 0.53  CALCIUM 9.6 9.2 9.3 9.4  MG  --   --  1.7 1.9   GFR: Estimated Creatinine Clearance: 60.6 mL/min (by C-G formula based on SCr of 0.53 mg/dL). Liver Function Tests: Recent Labs  Lab 09/24/20 1029 09/25/20 0511 09/26/20 0508 09/27/20 0517  AST 292* 274* 260* 189*  ALT 437* 402* 438* 356*  ALKPHOS 224* 203* 228* 232*  BILITOT 6.7* 6.5* 8.5* 9.4*  PROT 7.2 6.3* 6.7 6.7  ALBUMIN 4.1 3.6 3.7 3.7   Recent Labs  Lab 09/24/20 1029  LIPASE 34   No results for input(s): AMMONIA in the last 168 hours. Coagulation Profile: Recent Labs  Lab 09/27/20 0517  INR 1.0   Cardiac Enzymes: No results for input(s): CKTOTAL, CKMB, CKMBINDEX, TROPONINI in the last 168 hours. BNP (last 3 results) No results for input(s): PROBNP in the last 8760 hours. HbA1C: Recent Labs    09/25/20 0511  HGBA1C 7.1*   CBG: Recent Labs  Lab 09/26/20 1155 09/26/20 1745 09/26/20 2158 09/27/20 0800 09/27/20 1218  GLUCAP 149* 115* 203* 135* 139*   Lipid Profile: No results for input(s): CHOL, HDL, LDLCALC, TRIG, CHOLHDL, LDLDIRECT in the last 72 hours. Thyroid Function Tests: No results for input(s): TSH, T4TOTAL, FREET4, T3FREE, THYROIDAB in the last 72 hours. Anemia Panel: No results for input(s): VITAMINB12, FOLATE, FERRITIN, TIBC, IRON, RETICCTPCT in the last 72 hours. Sepsis Labs: No results for input(s): PROCALCITON, LATICACIDVEN in the last 168 hours.  No results found for this or any previous visit (from the past 240 hour(s)).       Radiology Studies: MR 3D Recon At Scanner  Result Date: 09/26/2020 CLINICAL DATA:  Jaundice.  Pancreatic cancer.  Liver metastasis EXAM: MRI ABDOMEN WITHOUT AND WITH CONTRAST (INCLUDING  MRCP) TECHNIQUE: Multiplanar multisequence MR imaging of the abdomen was performed both before and after the administration of intravenous contrast. Heavily T2-weighted images of the biliary and pancreatic ducts were obtained, and three-dimensional MRCP images were rendered by post processing. CONTRAST:  This COMPARISON:  MRI 08/27/2020, CT 09/24/2020 gauge FINDINGS: Lower chest:  Lung bases are clear. Hepatobiliary: On the MRCP sequence, there is new abrupt obstruction of the common hepatic duct (image 37/series 13). There is new extra and intrahepatic biliary duct dilatation. There is new obstruction of the common hepatic duct relates to expansion of the pancreatic mass medially into the porta hepatis (image 15/5). Multiple rim enhancing hepatic lesions again demonstrated consistent metastatic disease. There are 5 lesions in the RIGHT hepatic lobe. Lesion increased moderately from MRI 08/27/2020. Example RIGHT hepatic lobe lesion measures 26 mm increased from 18 mm. Superior RIGHT hepatic lobe lesion measures 20 mm compared to 12  mm. Pancreas: Long lesion within the body the pancreas measuring 6.0 x 1.9 cm consistent primary adenocarcinoma (image 34/20 Spleen: Normal spleen. Adrenals/urinary tract: Adrenal glands and kidneys are normal. Stomach/Bowel: Stomach and limited of the small bowel is unremarkable Vascular/Lymphatic: Abdominal aortic normal caliber. No retroperitoneal periportal lymphadenopathy. Musculoskeletal: No aggressive osseous lesion IMPRESSION: 1. New abrupt constriction of the common hepatic duct. The infiltrative pancreatic mass has extending medially in the porta hepatis to obstruct the common hepatic duct. 2. Increase in intrahepatic and extrahepatic biliary duct dilatation explains patient's jaundice. 3. Multiple RIGHT hepatic lobe metastasis mildly increased in size from 08/27/2020. 4. Broad lesion within the pancreatic body consistent with primary pancreatic carcinoma. Electronically Signed    By: Suzy Bouchard M.D.   On: 09/26/2020 21:50   MR ABDOMEN MRCP W WO CONTAST  Result Date: 09/26/2020 CLINICAL DATA:  Jaundice.  Pancreatic cancer.  Liver metastasis EXAM: MRI ABDOMEN WITHOUT AND WITH CONTRAST (INCLUDING MRCP) TECHNIQUE: Multiplanar multisequence MR imaging of the abdomen was performed both before and after the administration of intravenous contrast. Heavily T2-weighted images of the biliary and pancreatic ducts were obtained, and three-dimensional MRCP images were rendered by post processing. CONTRAST:  This COMPARISON:  MRI 08/27/2020, CT 09/24/2020 gauge FINDINGS: Lower chest:  Lung bases are clear. Hepatobiliary: On the MRCP sequence, there is new abrupt obstruction of the common hepatic duct (image 37/series 13). There is new extra and intrahepatic biliary duct dilatation. There is new obstruction of the common hepatic duct relates to expansion of the pancreatic mass medially into the porta hepatis (image 15/5). Multiple rim enhancing hepatic lesions again demonstrated consistent metastatic disease. There are 5 lesions in the RIGHT hepatic lobe. Lesion increased moderately from MRI 08/27/2020. Example RIGHT hepatic lobe lesion measures 26 mm increased from 18 mm. Superior RIGHT hepatic lobe lesion measures 20 mm compared to 12 mm. Pancreas: Long lesion within the body the pancreas measuring 6.0 x 1.9 cm consistent primary adenocarcinoma (image 34/20 Spleen: Normal spleen. Adrenals/urinary tract: Adrenal glands and kidneys are normal. Stomach/Bowel: Stomach and limited of the small bowel is unremarkable Vascular/Lymphatic: Abdominal aortic normal caliber. No retroperitoneal periportal lymphadenopathy. Musculoskeletal: No aggressive osseous lesion IMPRESSION: 1. New abrupt constriction of the common hepatic duct. The infiltrative pancreatic mass has extending medially in the porta hepatis to obstruct the common hepatic duct. 2. Increase in intrahepatic and extrahepatic biliary duct  dilatation explains patient's jaundice. 3. Multiple RIGHT hepatic lobe metastasis mildly increased in size from 08/27/2020. 4. Broad lesion within the pancreatic body consistent with primary pancreatic carcinoma. Electronically Signed   By: Suzy Bouchard M.D.   On: 09/26/2020 21:50        Scheduled Meds:  amLODipine  5 mg Oral QHS   aspirin EC  81 mg Oral Daily   Chlorhexidine Gluconate Cloth  6 each Topical Daily   docusate sodium  100 mg Oral BID   enoxaparin (LOVENOX) injection  40 mg Subcutaneous Q24H   insulin aspart  0-6 Units Subcutaneous TID WC   levothyroxine  125 mcg Oral Q0600   pantoprazole  40 mg Oral Daily   Continuous Infusions:  sodium chloride 75 mL/hr at 09/25/20 2351   [START ON 09/28/2020] ceFEPime (MAXIPIME) IV       LOS: 2 days    Time spent: 40 min  Georgette Shell, MD 09/27/2020, 2:27 PM

## 2020-09-28 ENCOUNTER — Inpatient Hospital Stay (HOSPITAL_COMMUNITY): Payer: Medicare HMO | Admitting: Anesthesiology

## 2020-09-28 ENCOUNTER — Encounter (HOSPITAL_COMMUNITY)
Admission: EM | Disposition: A | Discharge status: Home / Self Care | Payer: Self-pay | Source: Home / Self Care | Attending: Internal Medicine

## 2020-09-28 ENCOUNTER — Inpatient Hospital Stay (HOSPITAL_COMMUNITY): Payer: Medicare HMO

## 2020-09-28 ENCOUNTER — Encounter (HOSPITAL_COMMUNITY): Payer: Self-pay | Admitting: Internal Medicine

## 2020-09-28 HISTORY — PX: SPHINCTEROTOMY: SHX5544

## 2020-09-28 HISTORY — PX: BILIARY STENT PLACEMENT: SHX5538

## 2020-09-28 HISTORY — PX: ERCP: SHX5425

## 2020-09-28 LAB — COMPREHENSIVE METABOLIC PANEL
ALT: 283 U/L — ABNORMAL HIGH (ref 0–44)
AST: 134 U/L — ABNORMAL HIGH (ref 15–41)
Albumin: 3.3 g/dL — ABNORMAL LOW (ref 3.5–5.0)
Alkaline Phosphatase: 236 U/L — ABNORMAL HIGH (ref 38–126)
Anion gap: 9 (ref 5–15)
BUN: 10 mg/dL (ref 8–23)
CO2: 22 mmol/L (ref 22–32)
Calcium: 9 mg/dL (ref 8.9–10.3)
Chloride: 106 mmol/L (ref 98–111)
Creatinine, Ser: 0.54 mg/dL (ref 0.44–1.00)
GFR, Estimated: >60 mL/min (ref >60)
Glucose, Bld: 136 mg/dL — ABNORMAL HIGH (ref 70–99)
Potassium: 3.7 mmol/L (ref 3.5–5.1)
Sodium: 137 mmol/L (ref 135–145)
Total Bilirubin: 9.2 mg/dL — ABNORMAL HIGH (ref 0.3–1.2)
Total Protein: 6.1 g/dL — ABNORMAL LOW (ref 6.5–8.1)

## 2020-09-28 LAB — CBC WITH DIFFERENTIAL/PLATELET
Abs Immature Granulocytes: 0.02 10*3/uL (ref 0.00–0.07)
Basophils Absolute: 0 10*3/uL (ref 0.0–0.1)
Basophils Relative: 0 %
Eosinophils Absolute: 0.1 10*3/uL (ref 0.0–0.5)
Eosinophils Relative: 2 %
HCT: 40.6 % (ref 36.0–46.0)
Hemoglobin: 13.5 g/dL (ref 12.0–15.0)
Immature Granulocytes: 0 %
Lymphocytes Relative: 31 %
Lymphs Abs: 1.5 10*3/uL (ref 0.7–4.0)
MCH: 31 pg (ref 26.0–34.0)
MCHC: 33.3 g/dL (ref 30.0–36.0)
MCV: 93.3 fL (ref 80.0–100.0)
Monocytes Absolute: 0.4 10*3/uL (ref 0.1–1.0)
Monocytes Relative: 7 %
Neutro Abs: 2.8 10*3/uL (ref 1.7–7.7)
Neutrophils Relative %: 60 %
Platelets: 134 10*3/uL — ABNORMAL LOW (ref 150–400)
RBC: 4.35 MIL/uL (ref 3.87–5.11)
RDW: 14.4 % (ref 11.5–15.5)
WBC: 4.8 10*3/uL (ref 4.0–10.5)
nRBC: 0 % (ref 0.0–0.2)

## 2020-09-28 LAB — RESP PANEL BY RT-PCR (FLU A&B, COVID) ARPGX2
Influenza A by PCR: NEGATIVE
Influenza B by PCR: NEGATIVE
SARS Coronavirus 2 by RT PCR: NEGATIVE

## 2020-09-28 LAB — GLUCOSE, CAPILLARY
Glucose-Capillary: 135 mg/dL — ABNORMAL HIGH (ref 70–99)
Glucose-Capillary: 167 mg/dL — ABNORMAL HIGH (ref 70–99)
Glucose-Capillary: 179 mg/dL — ABNORMAL HIGH (ref 70–99)

## 2020-09-28 LAB — MAGNESIUM: Magnesium: 1.9 mg/dL (ref 1.7–2.4)

## 2020-09-28 LAB — SURGICAL PCR SCREEN
MRSA, PCR: NEGATIVE
Staphylococcus aureus: POSITIVE — AB

## 2020-09-28 SURGERY — ERCP, WITH INTERVENTION IF INDICATED
Anesthesia: General

## 2020-09-28 MED ORDER — PHENYLEPHRINE 40 MCG/ML (10ML) SYRINGE FOR IV PUSH (FOR BLOOD PRESSURE SUPPORT)
PREFILLED_SYRINGE | INTRAVENOUS | Status: DC | PRN
  Administered 2020-09-28: 80 ug via INTRAVENOUS

## 2020-09-28 MED ORDER — LIDOCAINE 2% (20 MG/ML) 5 ML SYRINGE
INTRAMUSCULAR | Status: DC | PRN
  Administered 2020-09-28: 60 mg via INTRAVENOUS

## 2020-09-28 MED ORDER — ONDANSETRON HCL 4 MG/2ML IJ SOLN
INTRAMUSCULAR | Status: DC | PRN
  Administered 2020-09-28: 4 mg via INTRAVENOUS

## 2020-09-28 MED ORDER — PROPOFOL 10 MG/ML IV BOLUS
INTRAVENOUS | Status: AC
  Filled 2020-09-28: qty 20

## 2020-09-28 MED ORDER — CIPROFLOXACIN IN D5W 400 MG/200ML IV SOLN
INTRAVENOUS | Status: AC
  Filled 2020-09-28: qty 200

## 2020-09-28 MED ORDER — INDOMETHACIN 50 MG RE SUPP
RECTAL | Status: DC | PRN
  Administered 2020-09-28: 50 mg via RECTAL

## 2020-09-28 MED ORDER — PROPOFOL 500 MG/50ML IV EMUL
INTRAVENOUS | Status: AC
  Filled 2020-09-28: qty 100

## 2020-09-28 MED ORDER — LACTATED RINGERS IV SOLN
INTRAVENOUS | Status: DC
  Administered 2020-09-28: 1000 mL via INTRAVENOUS

## 2020-09-28 MED ORDER — FENTANYL CITRATE (PF) 100 MCG/2ML IJ SOLN
INTRAMUSCULAR | Status: AC
  Filled 2020-09-28: qty 2

## 2020-09-28 MED ORDER — METOPROLOL TARTRATE 5 MG/5ML IV SOLN
5.0000 mg | Freq: Four times a day (QID) | INTRAVENOUS | Status: DC | PRN
  Administered 2020-09-28: 5 mg via INTRAVENOUS
  Filled 2020-09-28: qty 5

## 2020-09-28 MED ORDER — INDOMETHACIN 50 MG RE SUPP
RECTAL | Status: AC
  Filled 2020-09-28: qty 2

## 2020-09-28 MED ORDER — ROCURONIUM BROMIDE 10 MG/ML (PF) SYRINGE
PREFILLED_SYRINGE | INTRAVENOUS | Status: DC | PRN
  Administered 2020-09-28: 50 mg via INTRAVENOUS

## 2020-09-28 MED ORDER — INDOMETHACIN 50 MG RE SUPP: 100.0000 mg | Freq: Once | RECTAL | Status: DC

## 2020-09-28 MED ORDER — SODIUM CHLORIDE 0.9 % IV SOLN
INTRAVENOUS | Status: DC | PRN
  Administered 2020-09-28: 35 mL

## 2020-09-28 MED ORDER — DEXAMETHASONE SODIUM PHOSPHATE 10 MG/ML IJ SOLN
INTRAMUSCULAR | Status: DC | PRN
  Administered 2020-09-28: 5 mg via INTRAVENOUS

## 2020-09-28 MED ORDER — CIPROFLOXACIN IN D5W 400 MG/200ML IV SOLN
400.0000 mg | Freq: Once | INTRAVENOUS | Status: AC
  Administered 2020-09-28: 400 mg via INTRAVENOUS

## 2020-09-28 MED ORDER — CHLORHEXIDINE GLUCONATE CLOTH 2 % EX PADS
6.0000 | MEDICATED_PAD | Freq: Every day | CUTANEOUS | Status: DC
  Administered 2020-09-28 – 2020-09-29 (×2): 6 via TOPICAL

## 2020-09-28 MED ORDER — SUCCINYLCHOLINE CHLORIDE 20 MG/ML IJ SOLN
INTRAMUSCULAR | Status: DC | PRN
  Administered 2020-09-28: 100 mg via INTRAVENOUS

## 2020-09-28 MED ORDER — FENTANYL CITRATE (PF) 100 MCG/2ML IJ SOLN
INTRAMUSCULAR | Status: DC | PRN
  Administered 2020-09-28 (×2): 50 ug via INTRAVENOUS

## 2020-09-28 MED ORDER — PROPOFOL 1000 MG/100ML IV EMUL
INTRAVENOUS | Status: AC
  Filled 2020-09-28: qty 100

## 2020-09-28 MED ORDER — PROPOFOL 10 MG/ML IV BOLUS
INTRAVENOUS | Status: DC | PRN
  Administered 2020-09-28: 110 mg via INTRAVENOUS

## 2020-09-28 MED ORDER — SUGAMMADEX SODIUM 200 MG/2ML IV SOLN
INTRAVENOUS | Status: DC | PRN
  Administered 2020-09-28: 200 mg via INTRAVENOUS

## 2020-09-28 MED ORDER — GLUCAGON HCL RDNA (DIAGNOSTIC) 1 MG IJ SOLR
INTRAMUSCULAR | Status: AC
  Filled 2020-09-28: qty 1

## 2020-09-28 MED ORDER — MUPIROCIN 2 % EX OINT
1.0000 "application " | TOPICAL_OINTMENT | Freq: Two times a day (BID) | CUTANEOUS | Status: DC
  Administered 2020-09-28 – 2020-09-29 (×3): 1 via NASAL
  Filled 2020-09-28: qty 22

## 2020-09-28 MED ORDER — SODIUM CHLORIDE 0.9 % IV SOLN: INTRAVENOUS | Status: DC

## 2020-09-28 NOTE — Op Note (Signed)
[facility] Patient Name: [name] Procedure Date: 09/28/2020 MRN: [id number] Attending MD: Clarene Essex , MD Date of Birth: December 03, 1945 CSN: [id number] Age: 75 Admit Type: Inpatient Procedure:                ERCP Indications:              Abnormal MRCP compatible with proximal CBD                            obstruction, Jaundice, Malignant tumor of the head                            of pancreas Providers:                Clarene Essex, MD, Josie Dixon, RN, Benetta Spar, Technician, Herbie Drape, CRNA Referring MD:              Medicines:                General Anesthesia Complications:            No immediate complications. Estimated Blood Loss:     Estimated blood loss: none. Procedure:                Pre-Anesthesia Assessment:                           - Prior to the procedure, a History and Physical                            was performed, and patient medications and                            allergies were reviewed. The patient's tolerance of                            previous anesthesia was also reviewed. The risks                            and benefits of the procedure and the sedation                            options and risks were discussed with the patient.                            All questions were answered, and informed consent                            was obtained. Prior Anticoagulants: The patient has                            taken no previous anticoagulant or antiplatelet                            agents except for aspirin.  ASA Grade Assessment:                            III - A patient with severe systemic disease. After                            reviewing the risks and benefits, the patient was                            deemed in satisfactory condition to undergo the                            procedure.                           After obtaining informed consent, the scope was                             passed under direct vision. Throughout the                            procedure, the patient's blood pressure, pulse, and                            oxygen saturations were monitored continuously. The                            TJF-Q180V ([id number] Olympus Duodenoscope was                            introduced through the mouth, and used to inject                            contrast into and used to locate the major papilla.                            The ERCP was accomplished without difficulty. The                            patient tolerated the procedure well. Scope In: Scope Out: Findings:      The major papilla was normal. Deep selective cannulation was readily       obtained and there was no pancreatic duct injection or wire advancement       and we proceeded with a biliary sphincterotomy was made with a Hydratome       sphincterotome using ERBE electrocautery. There was no       post-sphincterotomy bleeding. We did a medium size sphincterotomy after       contrast was injected which showed the upper third of the main bile duct       contained a single localized stenosis. To discover objects and confirmed       the stricture, the biliary tree was swept with an adjustable 9- 12 mm       balloon starting at the upper third of the main bile duct, lower third  of the main duct and bifurcation. This confirmed the proximal stricture       and we placed one 10 Fr by 6 cm uncovered metal stent with no external       flaps and no internal flaps was placed 5.5 cm into the common bile duct.       The stent was in good position there was a tight waist in the middle of       the stent. The introducer and wire was removed and the scope was removed       and the patient tolerated the procedure well Impression:               - The major papilla appeared normal.                           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 The                            stricture was malignant appearing. This stricture                            was treated with biliary sphincterotomy.                           - A sphincterotomy was performed.                           - The biliary tree was swept and and occlusion                            cholangiogram was done with the balloon to confirm                            the stricture.                           - One uncovered metal stent was placed into the                            common bile duct. Moderate Sedation:      Not Applicable - Patient had care per Anesthesia. Recommendation:           - Clear liquid diet for 6 hours. If doing well may                            have soft solids this evening if not slowly advance                            tomorrow                           - Continue present medications.                           -  Return to GI clinic PRN.                           - Telephone GI clinic if symptomatic PRN.                           - Check liver enzymes (AST, ALT, alkaline                            phosphatase, bilirubin) tomorrow and can follow                            back to near normal as an outpatient. Procedure Code(s):        --- Professional ---                           [id number], Esophagogastroduodenoscopy, flexible,                            transoral; diagnostic, including collection of                            specimen(s) by brushing or washing, when performed                            (separate procedure) Diagnosis Code(s):        --- Professional ---                           K83.1, Obstruction of bile duct                           R17, Unspecified jaundice                           C25.0, Malignant neoplasm of head of pancreas                           R93.2, Abnormal findings on diagnostic imaging of                            liver and biliary tract CPT copyright 2019 [employer]. All  rights reserved. The codes documented in this report are preliminary and upon coder review may  be revised to meet current compliance requirements. Clarene Essex, MD 09/28/2020 1:00:17 PM This report has been signed electronically. Number of Addenda: 0

## 2020-09-28 NOTE — Progress Notes (Signed)
[name] 11:51 AM  Subjective: Patient seen and examined in hospital computer chart reviewed and case discussed with my partner Dr. Alessandra Bevels and she has no new complaints and we rediscussed the procedure  Objective: Vital signs stable afebrile exam please see preassessment evaluation labs and MRCP reviewed  Assessment: Metastatic pancreatic cancer with CBD obstruction  Plan: Okay to proceed with ERCP with anesthesia assistance  [facility]  office [phone] After 5PM or if no answer call [phone]

## 2020-09-28 NOTE — Anesthesia Preprocedure Evaluation (Addendum)
Anesthesia Evaluation  Patient identified by MRN, date of birth, ID band Patient awake    Reviewed: Allergy & Precautions, NPO status , Patient's Chart, lab work & pertinent test results  Airway Mallampati: II  TM Distance: >3 FB Neck ROM: Full    Dental no notable dental hx. (+) Teeth Intact, Dental Advisory Given   Pulmonary former smoker,  Quit smoking 1989, 10 pack year history    Pulmonary exam normal breath sounds clear to auscultation       Cardiovascular hypertension (poorly controlled- 192/104 in preop. SBP 130s-150s overnight on the floor), Pt. 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   Endo/Other  diabetes, Well Controlled, Type 2Hypothyroidism a1c 7.1  Renal/GU negative Renal ROS  negative genitourinary   Musculoskeletal  (+) Arthritis , Osteoarthritis,    Abdominal   Peds  Hematology negative hematology ROS (+) hct 40.6, plt 134   Anesthesia Other Findings   Reproductive/Obstetrics negative OB ROS                          Anesthesia Physical Anesthesia Plan  ASA: 4  Anesthesia Plan: General   Post-op Pain Management:    Induction: Intravenous  PONV Risk Score and Plan: Ondansetron, Dexamethasone and Treatment may vary due to age or medical condition  Airway Management Planned: Oral ETT  Additional Equipment: None  Intra-op Plan:   Post-operative Plan: Extubation in OR  Informed Consent: I have reviewed the patients History and Physical, chart, labs and discussed the procedure including the risks, benefits and alternatives for the proposed  anesthesia with the patient or authorized representative who has indicated his/her understanding and acceptance.   Patient has DNR.  Discussed DNR with patient and Suspend DNR.   Dental advisory given  Plan Discussed with: CRNA  Anesthesia Plan Comments: (Has port accessed already- will use for procedure Wishes to suspend DNR for 24h perioperatively )      Anesthesia Quick Evaluation

## 2020-09-28 NOTE — Transfer of Care (Signed)
Immediate Anesthesia Transfer of Care Note  Patient: [name]  Procedure(s) Performed: ENDOSCOPIC RETROGRADE CHOLANGIOPANCREATOGRAPHY (ERCP) SPHINCTEROTOMY REMOVAL OF STONES BILIARY STENT PLACEMENT  Patient Location: PACU  Anesthesia Type:General  Level of Consciousness: sedated, patient cooperative and responds to stimulation  Airway & Oxygen Therapy: Patient Spontanous Breathing and Patient connected to face mask oxygen  Post-op Assessment: Report given to RN and Post -op Vital signs reviewed and stable  Post vital signs: Reviewed and stable  Last Vitals:  Vitals Value Taken Time  BP    Temp    Pulse 87 09/28/20 1306  Resp 16 09/28/20 1306  SpO2 100 % 09/28/20 1306  Vitals shown include unvalidated device data.  Last Pain:  Vitals:   09/28/20 1055  TempSrc: Oral  PainSc: 1          Complications: No notable events documented.

## 2020-09-28 NOTE — Plan of Care (Signed)

## 2020-09-28 NOTE — Progress Notes (Signed)
PT Cancellation Note  Patient Details Name: [name] MRN: [id number] DOB: October 07, 1945   Cancelled Treatment:    Reason Eval/Treat Not Completed: Patient at procedure or test/unavailable Per RN/NT, pt off floor at procedure. Will follow up as schedule allows.   Festus Barren., PT, DPT  Acute Rehabilitation Services  Office [phone]   09/28/2020, 12:15 PM

## 2020-09-28 NOTE — Progress Notes (Signed)
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 She has recently undergone Port-A-Cath placement on 09/22/2020 with plans for starting chemo on 09/27/2020. She has had poor appetite at home recently with ongoing weight loss.  She was considered to be dehydrated on admission and was admitted for IV fluids and further work-up.  Assessment & Plan:   Principal Problem:   Primary pancreatic cancer with metastasis to other site [facility]) Active Problems:   Essential hypertension   Mixed hyperlipidemia   Acquired hypothyroidism   GAD (generalized anxiety disorder)   Elevated LFTs   Dehydration   Pancreatic adenocarcinoma ([name])    #metastatic pancreatic cancer with obstructive jaundice-status post liver biopsy 09/08/2020 adenocarcinoma consistent with primary pancreatic malignancy.  CT abdomen and pelvis 09/24/2020 shows mets to peritoneum liver and lungs. MRCP 09/26/2020 with abrupt constriction of common hepatic duct with intra and extrahepatic biliary dilatation. Total bilirubin 9.4 from 8.5 from 6.5. AST ALT 189 and 356.  ERCP  09/28/2020-S/P sphincterotomy and stent placement  Clear liquid diet  #Elevated LFTs likely secondary to mets.  #Hypothyroidism continue Synthroid  #Generalized anxiety disorder Xanax as needed  #Hyperlipidemia was on Crestor at home on hold due to elevated LFTs  #Hypertension continue current medications  Estimated body mass index is 28.4 kg/m as calculated from the following:   Height as of this encounter: 5\' 5"  (1.651 m).   Weight as of this encounter: 77.4 kg.  DVT prophylaxis: Lovenox  code Status: DNR Family Communication: Family at bedside Disposition Plan:  Status is:  Inpatient  Remains inpatient appropriate because:IV treatments appropriate due to intensity of illness or inability to take PO  Dispo: The patient is from: Home              Anticipated d/c is to: Home              Patient currently is not medically stable to d/c.   Difficult to place patient No    Consultants:  Oncology, GI  Procedures: None Antimicrobials: none  Subjective:  S/p ercp daughter by the bedside  No new c/o  Objective: Vitals:   09/28/20 1055 09/28/20 1305 09/28/20 1310 09/28/20 1358  BP: (!) 192/104 (!) 195/106 (!) 193/97 (!) 186/95  Pulse: 88 94  75  Resp: 17 18  16   Temp: 98.1 F (36.7 C) 98 F (36.7 C)  97.9 F (36.6 C)  TempSrc: Oral Oral  Oral  SpO2: 98% 100%  99%  Weight:      Height:        Intake/Output Summary (Last 24 hours) at 09/28/2020 1426 Last data filed at 09/28/2020 1255 Gross per 24 hour  Intake 920 ml  Output 1600 ml  Net -680 ml    Filed Weights   09/26/20 0707  Weight: 77.4 kg    Examination:  General exam: Appears calm and comfortable  Respiratory system: Clear to auscultation. Respiratory effort normal. Cardiovascular system: S1 & S2 heard, RRR. No JVD, murmurs, rubs, gallops or clicks. No pedal edema. Gastrointestinal system: Abdomen is nondistended, soft and mild tender right upper quadrant no organomegaly or masses felt. Normal bowel sounds heard. Central nervous system: Alert and oriented. No focal neurological deficits. Extremities: Symmetric 5 x 5 power. Skin: No rashes,  lesions or ulcers Psychiatry: Judgement and insight appear normal. Mood & affect appropriate.     Data Reviewed: I have personally reviewed following labs and imaging studies  CBC: Recent Labs  Lab 09/24/20 1029 09/25/20 0511 09/26/20 0508 09/27/20 0517 09/28/20 0514  WBC 6.6 4.7 4.8 4.4 4.8  NEUTROABS 4.3  --  2.8 2.6 2.8  HGB 15.4* 14.0 14.1 14.1 13.5  HCT 45.3 42.6 42.7 43.2 40.6  MCV 90.6 93.0 92.6 93.3 93.3  PLT 160 141* 130*  134* 134*    Basic Metabolic Panel: Recent Labs  Lab 09/24/20 1029 09/25/20 0511 09/26/20 0508 09/27/20 0517 09/28/20 0514  NA 133* 135 136 137 137  K 3.8 3.9 3.7 3.5 3.7  CL 103 107 105 107 106  CO2 20* 19* 21* 20* 22  GLUCOSE 186* 169* 156* 161* 136*  BUN 7* 7* 7* 9 10  CREATININE 0.69 0.54 0.56 0.53 0.54  CALCIUM 9.6 9.2 9.3 9.4 9.0  MG  --   --  1.7 1.9 1.9    GFR: Estimated Creatinine Clearance: 60.6 mL/min (by C-G formula based on SCr of 0.54 mg/dL). Liver Function Tests: Recent Labs  Lab 09/24/20 1029 09/25/20 0511 09/26/20 0508 09/27/20 0517 09/28/20 0514  AST 292* 274* 260* 189* 134*  ALT 437* 402* 438* 356* 283*  ALKPHOS 224* 203* 228* 232* 236*  BILITOT 6.7* 6.5* 8.5* 9.4* 9.2*  PROT 7.2 6.3* 6.7 6.7 6.1*  ALBUMIN 4.1 3.6 3.7 3.7 3.3*    Recent Labs  Lab 09/24/20 1029  LIPASE 34    No results for input(s): AMMONIA in the last 168 hours. Coagulation Profile: Recent Labs  Lab 09/27/20 0517  INR 1.0    Cardiac Enzymes: No results for input(s): CKTOTAL, CKMB, CKMBINDEX, TROPONINI in the last 168 hours. BNP (last 3 results) No results for input(s): PROBNP in the last 8760 hours. HbA1C: No results for input(s): HGBA1C in the last 72 hours.  CBG: Recent Labs  Lab 09/27/20 0800 09/27/20 1218 09/27/20 1749 09/27/20 2129 09/28/20 0816  GLUCAP 135* 139* 172* 135* 135*    Lipid Profile: No results for input(s): CHOL, HDL, LDLCALC, TRIG, CHOLHDL, LDLDIRECT in the last 72 hours. Thyroid Function Tests: No results for input(s): TSH, T4TOTAL, FREET4, T3FREE, THYROIDAB in the last 72 hours. Anemia Panel: No results for input(s): VITAMINB12, FOLATE, FERRITIN, TIBC, IRON, RETICCTPCT in the last 72 hours. Sepsis Labs: No results for input(s): PROCALCITON, LATICACIDVEN in the last 168 hours.  Recent Results (from the past 240 hour(s))  Surgical pcr screen     Status: Abnormal   Collection Time: 09/27/20 10:00 PM   Specimen: Nasal Mucosa; Nasal  Swab  Result Value Ref Range Status   MRSA, PCR NEGATIVE NEGATIVE Final   Staphylococcus aureus POSITIVE (A) NEGATIVE Final    Comment: (NOTE) The Xpert SA Assay (FDA approved for NASAL specimens in patients 9 years of age and older), is one component of a comprehensive surveillance program. It is not intended to diagnose infection nor to guide or monitor treatment. Performed at [facility], [place] [address], [place], [place] [address]   Resp Panel by RT-PCR (Flu A&B, Covid) Nasopharyngeal Swab     Status: None   Collection Time: 09/28/20  8:37 AM   Specimen: Nasopharyngeal Swab; Nasopharyngeal(NP) swabs in vial transport medium  Result Value Ref Range Status   SARS Coronavirus 2 by RT PCR NEGATIVE NEGATIVE Final    Comment: (NOTE) SARS-CoV-2 target nucleic acids are NOT DETECTED.  The SARS-CoV-2  RNA is generally detectable in upper respiratory specimens during the acute phase of infection. The lowest concentration of SARS-CoV-2 viral copies this assay can detect is 138 copies/mL. A negative result does not preclude SARS-Cov-2 infection and should not be used as the sole basis for treatment or other patient management decisions. A negative result may occur with  improper specimen collection/handling, submission of specimen other than nasopharyngeal swab, presence of viral mutation(s) within the areas targeted by this assay, and inadequate number of viral copies(<138 copies/mL). A negative result must be combined with clinical observations, patient history, and epidemiological information. The expected result is Negative.  Fact Sheet for Patients:  EntrepreneurPulse.com.au  Fact Sheet for Healthcare Providers:  IncredibleEmployment.be  This test is no t yet approved or cleared by the [place] FDA and  has been authorized for detection and/or diagnosis of SARS-CoV-2 by FDA under an Emergency Use Authorization (EUA).  This EUA will remain  in effect (meaning this test can be used) for the duration of the COVID-19 declaration under Section 564(b)(1) of the Act, 21 U.S.C.section 360bbb-3(b)(1), unless the authorization is terminated  or revoked sooner.       Influenza A by PCR NEGATIVE NEGATIVE Final   Influenza B by PCR NEGATIVE NEGATIVE Final    Comment: (NOTE) The Xpert Xpress SARS-CoV-2/FLU/RSV plus assay is intended as an aid in the diagnosis of influenza from Nasopharyngeal swab specimens and should not be used as a sole basis for treatment. Nasal washings and aspirates are unacceptable for Xpert Xpress SARS-CoV-2/FLU/RSV testing.  Fact Sheet for Patients: EntrepreneurPulse.com.au  Fact Sheet for Healthcare Providers: IncredibleEmployment.be  This test is not yet approved or cleared by the [place] FDA and has been authorized for detection and/or diagnosis of SARS-CoV-2 by FDA under an Emergency Use Authorization (EUA). This EUA will remain in effect (meaning this test can be used) for the duration of the COVID-19 declaration under Section 564(b)(1) of the Act, 21 U.S.C. section 360bbb-3(b)(1), unless the authorization is terminated or revoked.  Performed at [facility], [place] [address] [place], [place] [address]          Radiology Studies: MR 3D Recon At Scanner  Result Date: 09/26/2020 CLINICAL DATA:  Jaundice.  Pancreatic cancer.  Liver metastasis EXAM: MRI ABDOMEN WITHOUT AND WITH CONTRAST (INCLUDING MRCP) TECHNIQUE: Multiplanar multisequence MR imaging of the abdomen was performed both before and after the administration of intravenous contrast. Heavily T2-weighted images of the biliary and pancreatic ducts were obtained, and three-dimensional MRCP images were rendered by post processing. CONTRAST:  This COMPARISON:  MRI 08/27/2020, CT 09/24/2020 gauge FINDINGS: Lower chest:  Lung bases are clear. Hepatobiliary: On the  MRCP sequence, there is new abrupt obstruction of the common hepatic duct (image 37/series 13). There is new extra and intrahepatic biliary duct dilatation. There is new obstruction of the common hepatic duct relates to expansion of the pancreatic mass medially into the porta hepatis (image 15/5). Multiple rim enhancing hepatic lesions again demonstrated consistent metastatic disease. There are 5 lesions in the RIGHT hepatic lobe. Lesion increased moderately from MRI 08/27/2020. Example RIGHT hepatic lobe lesion measures 26 mm increased from 18 mm. Superior RIGHT hepatic lobe lesion measures 20 mm compared to 12 mm. Pancreas: Long lesion within the body the pancreas measuring 6.0 x 1.9 cm consistent primary adenocarcinoma (image 34/20 Spleen: Normal spleen. Adrenals/urinary tract: Adrenal glands and kidneys are normal. Stomach/Bowel: Stomach and limited of the small bowel is unremarkable Vascular/Lymphatic: Abdominal aortic normal caliber. No retroperitoneal  periportal lymphadenopathy. Musculoskeletal: No aggressive osseous lesion IMPRESSION: 1. New abrupt constriction of the common hepatic duct. The infiltrative pancreatic mass has extending medially in the porta hepatis to obstruct the common hepatic duct. 2. Increase in intrahepatic and extrahepatic biliary duct dilatation explains patient's jaundice. 3. Multiple RIGHT hepatic lobe metastasis mildly increased in size from 08/27/2020. 4. Broad lesion within the pancreatic body consistent with primary pancreatic carcinoma. Electronically Signed   By: Suzy Bouchard M.D.   On: 09/26/2020 21:50   DG ERCP BILIARY & PANCREATIC DUCTS  Result Date: 09/28/2020 CLINICAL DATA:  Pancreatic carcinoma, jaundice, liver metastases, common hepatic duct obstruction on MRCP EXAM: ERCP TECHNIQUE: Multiple spot images obtained with the fluoroscopic device and submitted for interpretation post-procedure. COMPARISON:  MRCP 09/26/2020 FINDINGS: A series of fluoroscopic spot images  document endoscopic cannulation and opacification of the biliary tree. Cystic duct is patent. There is tapered narrowing of the common hepatic duct near the level of the cystic duct confluence. Subsequent images document deployment of metallic biliary stent through the length of the common duct with persistent tapered narrowing in its midportion. IMPRESSION: Endoscopic CBD cannulation and intervention as above. These images were submitted for radiologic interpretation only. Please see the procedural report for the amount of contrast and the fluoroscopy time utilized. Electronically Signed   By: Lucrezia Europe M.D.   On: 09/28/2020 13:30   MR ABDOMEN MRCP W WO CONTAST  Result Date: 09/26/2020 CLINICAL DATA:  Jaundice.  Pancreatic cancer.  Liver metastasis EXAM: MRI ABDOMEN WITHOUT AND WITH CONTRAST (INCLUDING MRCP) TECHNIQUE: Multiplanar multisequence MR imaging of the abdomen was performed both before and after the administration of intravenous contrast. Heavily T2-weighted images of the biliary and pancreatic ducts were obtained, and three-dimensional MRCP images were rendered by post processing. CONTRAST:  This COMPARISON:  MRI 08/27/2020, CT 09/24/2020 gauge FINDINGS: Lower chest:  Lung bases are clear. Hepatobiliary: On the MRCP sequence, there is new abrupt obstruction of the common hepatic duct (image 37/series 13). There is new extra and intrahepatic biliary duct dilatation. There is new obstruction of the common hepatic duct relates to expansion of the pancreatic mass medially into the porta hepatis (image 15/5). Multiple rim enhancing hepatic lesions again demonstrated consistent metastatic disease. There are 5 lesions in the RIGHT hepatic lobe. Lesion increased moderately from MRI 08/27/2020. Example RIGHT hepatic lobe lesion measures 26 mm increased from 18 mm. Superior RIGHT hepatic lobe lesion measures 20 mm compared to 12 mm. Pancreas: Long lesion within the body the pancreas measuring 6.0 x 1.9 cm  consistent primary adenocarcinoma (image 34/20 Spleen: Normal spleen. Adrenals/urinary tract: Adrenal glands and kidneys are normal. Stomach/Bowel: Stomach and limited of the small bowel is unremarkable Vascular/Lymphatic: Abdominal aortic normal caliber. No retroperitoneal periportal lymphadenopathy. Musculoskeletal: No aggressive osseous lesion IMPRESSION: 1. New abrupt constriction of the common hepatic duct. The infiltrative pancreatic mass has extending medially in the porta hepatis to obstruct the common hepatic duct. 2. Increase in intrahepatic and extrahepatic biliary duct dilatation explains patient's jaundice. 3. Multiple RIGHT hepatic lobe metastasis mildly increased in size from 08/27/2020. 4. Broad lesion within the pancreatic body consistent with primary pancreatic carcinoma. Electronically Signed   By: Suzy Bouchard M.D.   On: 09/26/2020 21:50        Scheduled Meds:  amLODipine  5 mg Oral QHS   aspirin EC  81 mg Oral Daily   Chlorhexidine Gluconate Cloth  6 each Topical Q0600   docusate sodium  100 mg Oral BID   indomethacin  100 mg Rectal Once   insulin aspart  0-6 Units Subcutaneous TID WC   levothyroxine  125 mcg Oral Q0600   mupirocin ointment  1 application Nasal BID   pantoprazole  40 mg Oral Daily   Continuous Infusions:  sodium chloride 75 mL (09/28/20 0652)     LOS: 3 days    Time spent: 40 min  Georgette Shell, MD 09/28/2020, 2:26 PM

## 2020-09-28 NOTE — Progress Notes (Signed)
-   Received call from hospitalist Dr. Zigmund Daniel that patient's daughter was upset about events surrounding patient's ERCP today.  -Patient with biliary stricture and was in need for ERCP.  Dr. Paulita Fujita was covering ERCP for our group but he did not feel comfortable doing the procedure.  I discussed case with Dr. Rush Landmark and Dr. Watt Climes.  Dr. Watt Climes was willing to help with ERCP.  Patient was scheduled for ERCP today at noon time.  Dr. Watt Climes was doing his morning procedure at our endoscopy center.  -According to patient's daughter, someone from staff told her that her mom is waiting in the pre op because a doctor performing procedure is doing lunch.   Patient's procedure was done today without any unacceptable delays by Dr. Watt Climes.  Dr. Watt Climes discussed procedure findings with the patient and also personally to me.  -I called and discussed with the patient's daughter.  Situation surrounding her procedure scheduling discussed.  Without Dr. Watt Climes willing to help her out today, patient would have to wait until weekend or next week to get her ERCP done.  Patient's daughter appreciated all the efforts done to get this procedure done.  Otis Brace MD, [place] 09/28/2020, 3:08 PM  Contact #  [phone]

## 2020-09-28 NOTE — Anesthesia Postprocedure Evaluation (Signed)
Anesthesia Post Note  Patient: [name]  Procedure(s) Performed: ENDOSCOPIC RETROGRADE CHOLANGIOPANCREATOGRAPHY (ERCP) SPHINCTEROTOMY REMOVAL OF STONES BILIARY STENT PLACEMENT     Patient location during evaluation: PACU Anesthesia Type: General Level of consciousness: awake and alert, oriented and patient cooperative Pain management: pain level controlled Vital Signs Assessment: post-procedure vital signs reviewed and stable Respiratory status: spontaneous breathing, nonlabored ventilation and respiratory function stable Cardiovascular status: blood pressure returned to baseline and stable Postop Assessment: no apparent nausea or vomiting Anesthetic complications: no   No notable events documented.  Last Vitals:  Vitals:   09/28/20 1305 09/28/20 1310  BP: (!) 195/106 (!) 193/97  Pulse: 94   Resp: 18   Temp: 36.7 C   SpO2: 100%     Last Pain:  Vitals:   09/28/20 1310  TempSrc:   PainSc: 0-No pain                 Pervis Hocking

## 2020-09-28 NOTE — Anesthesia Procedure Notes (Signed)
Procedure Name: Intubation Date/Time: 09/28/2020 12:05 PM Performed by: West Pugh, CRNA Pre-anesthesia Checklist: Patient identified, Emergency Drugs available, Suction available, Patient being monitored and Timeout performed Patient Re-evaluated:Patient Re-evaluated prior to induction Oxygen Delivery Method: Circle system utilized Preoxygenation: Pre-oxygenation with 100% oxygen Induction Type: IV induction, Rapid sequence and Cricoid Pressure applied Laryngoscope Size: Mac and 3 Grade View: Grade I Tube type: Oral Tube size: 7.0 mm Number of attempts: 1 Airway Equipment and Method: Stylet Placement Confirmation: ETT inserted through vocal cords under direct vision, positive ETCO2, CO2 detector and breath sounds checked- equal and bilateral Secured at: 21 cm Tube secured with: Tape Dental Injury: Teeth and Oropharynx as per pre-operative assessment

## 2020-09-28 NOTE — Care Management Important Message (Signed)
Important Message  Patient Details IM Letter given to the Patient. Name: [name] MRN: [id number] Date of Birth: [birth year]   Medicare Important Message Given:  Yes     Kerin Salen 09/28/2020, 10:55 AM

## 2020-09-29 ENCOUNTER — Telehealth: Payer: Self-pay

## 2020-09-29 ENCOUNTER — Telehealth: Payer: Self-pay | Admitting: Oncology

## 2020-09-29 DIAGNOSIS — C259 Malignant neoplasm of pancreas, unspecified: Secondary | ICD-10-CM | POA: Diagnosis not present

## 2020-09-29 LAB — CBC WITH DIFFERENTIAL/PLATELET
Abs Immature Granulocytes: 0.03 K/uL (ref 0.00–0.07)
Basophils Absolute: 0 K/uL (ref 0.0–0.1)
Basophils Relative: 0 %
Eosinophils Absolute: 0.1 K/uL (ref 0.0–0.5)
Eosinophils Relative: 1 %
HCT: 39.5 % (ref 36.0–46.0)
Hemoglobin: 13.4 g/dL (ref 12.0–15.0)
Immature Granulocytes: 0 %
Lymphocytes Relative: 26 %
Lymphs Abs: 1.8 K/uL (ref 0.7–4.0)
MCH: 31.5 pg (ref 26.0–34.0)
MCHC: 33.9 g/dL (ref 30.0–36.0)
MCV: 92.7 fL (ref 80.0–100.0)
Monocytes Absolute: 0.5 K/uL (ref 0.1–1.0)
Monocytes Relative: 7 %
Neutro Abs: 4.6 K/uL (ref 1.7–7.7)
Neutrophils Relative %: 66 %
Platelets: 145 K/uL — ABNORMAL LOW (ref 150–400)
RBC: 4.26 MIL/uL (ref 3.87–5.11)
RDW: 14.4 % (ref 11.5–15.5)
WBC: 7 K/uL (ref 4.0–10.5)
nRBC: 0 % (ref 0.0–0.2)

## 2020-09-29 LAB — COMPREHENSIVE METABOLIC PANEL
ALT: 233 U/L — ABNORMAL HIGH (ref 0–44)
AST: 104 U/L — ABNORMAL HIGH (ref 15–41)
Albumin: 3.2 g/dL — ABNORMAL LOW (ref 3.5–5.0)
Alkaline Phosphatase: 224 U/L — ABNORMAL HIGH (ref 38–126)
Anion gap: 10 (ref 5–15)
BUN: 11 mg/dL (ref 8–23)
CO2: 21 mmol/L — ABNORMAL LOW (ref 22–32)
Calcium: 9 mg/dL (ref 8.9–10.3)
Chloride: 104 mmol/L (ref 98–111)
Creatinine, Ser: 0.62 mg/dL (ref 0.44–1.00)
GFR, Estimated: >60 mL/min (ref >60)
Glucose, Bld: 123 mg/dL — ABNORMAL HIGH (ref 70–99)
Potassium: 3.6 mmol/L (ref 3.5–5.1)
Sodium: 135 mmol/L (ref 135–145)
Total Bilirubin: 4.7 mg/dL — ABNORMAL HIGH (ref 0.3–1.2)
Total Protein: 6 g/dL — ABNORMAL LOW (ref 6.5–8.1)

## 2020-09-29 LAB — GLUCOSE, CAPILLARY
Glucose-Capillary: 125 mg/dL — ABNORMAL HIGH (ref 70–99)
Glucose-Capillary: 156 mg/dL — ABNORMAL HIGH (ref 70–99)

## 2020-09-29 LAB — MAGNESIUM: Magnesium: 1.8 mg/dL (ref 1.7–2.4)

## 2020-09-29 MED ORDER — POLYETHYLENE GLYCOL 3350 17 G PO PACK
17.0000 g | PACK | Freq: Every day | ORAL | Status: DC
  Administered 2020-09-29: 17 g via ORAL
  Filled 2020-09-29: qty 1

## 2020-09-29 MED ORDER — HEPARIN SOD (PORK) LOCK FLUSH 100 UNIT/ML IV SOLN
500.0000 [IU] | INTRAVENOUS | Status: DC
  Filled 2020-09-29: qty 5

## 2020-09-29 MED ORDER — HEPARIN SOD (PORK) LOCK FLUSH 100 UNIT/ML IV SOLN
500.0000 [IU] | INTRAVENOUS | Status: DC | PRN
  Administered 2020-09-29: 500 [IU]

## 2020-09-29 NOTE — Progress Notes (Signed)
[facility] Progress Note  [name] 75 y.o. 1945/04/19  CC:  Abnormal LFTs, metastatic pancreatic cancer  Subjective: Patient states she is feeling well this morning.  Denies abdominal pain, nausea/vomiting.  Tolerated soft diet for breakfast.  Has some lower abdominal discomfort which she believes is related to gas.  She states she has not had a BM for several days.  ROS : Review of Systems  Cardiovascular:  Negative for chest pain and palpitations.  Gastrointestinal:  Positive for constipation. Negative for abdominal pain, blood in stool, diarrhea, heartburn, melena, nausea and vomiting.   Objective: Vital signs in last 24 hours: Vitals:   09/28/20 2009 09/29/20 0425  BP: (!) 163/93 (!) 145/73  Pulse: 73 74  Resp: 16 16  Temp: 97.8 F (36.6 C) 97.6 F (36.4 C)  SpO2: 95% 97%    Physical Exam:  General:  Alert, cooperative, no distress; jaundiced  Head:  Normocephalic, without obvious abnormality, atraumatic  Eyes:  Scleral icterus, EOMs intact  Lungs:   Clear to auscultation bilaterally, respirations unlabored  Heart:  Regular rate and rhythm, S1, S2 normal  Abdomen:   Soft, non-tender, non-distended, bowel sounds active all four quadrants  Extremities: Extremities normal, atraumatic, no  edema    Lab Results: Recent Labs    09/28/20 0514 09/29/20 0547  NA 137 135  K 3.7 3.6  CL 106 104  CO2 22 21*  GLUCOSE 136* 123*  BUN 10 11  CREATININE 0.54 0.62  CALCIUM 9.0 9.0  MG 1.9 1.8    Recent Labs    09/28/20 0514 09/29/20 0547  AST 134* 104*  ALT 283* 233*  ALKPHOS 236* 224*  BILITOT 9.2* 4.7*  PROT 6.1* 6.0*  ALBUMIN 3.3* 3.2*    Recent Labs    09/28/20 0514 09/29/20 0547  WBC 4.8 7.0  NEUTROABS 2.8 4.6  HGB 13.5 13.4  HCT 40.6 39.5  MCV 93.3 92.7  PLT 134* 145*    Recent Labs    09/27/20 0517  LABPROT 12.9  INR 1.0     Assessment: Biliary obstruction due to pancreatic cancer s/p ERCP with CBD stent placement  09/28/20: -T.bili 4.7, improved from 9.2 yesterday -AST 104/ ALT 224/ ALP 224  Plan: OK to discharge from a GI standpoint.  Start Miralax daily PRN.  Further management and trending of LFTs as per oncology.  [facility] will sign off. Please contact us if we can be of any further assistance during this hospital stay.  Salley Slaughter PA-C 09/29/2020, 9:55 AM  Contact #  [phone]

## 2020-09-29 NOTE — Discharge Summary (Signed)
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 She has recently undergone Port-A-Cath placement on 09/22/2020 with plans for starting chemo on 09/27/2020.  She was admitted to the hospital on 09/24/2020.  Chemotherapy has been delayed. She has had poor appetite at home recently with ongoing weight loss.  She was considered to be dehydrated on admission and was admitted for IV fluids and further work-up.     Discharge Diagnoses:  Principal Problem:   Primary pancreatic cancer with metastasis to other site [facility]) Active Problems:   Essential hypertension   Mixed hyperlipidemia   Acquired hypothyroidism   GAD (generalized anxiety disorder)   Elevated LFTs   Dehydration   Pancreatic adenocarcinoma ([name])  #metastatic pancreatic cancer with obstructive jaundice-status post liver biopsy 09/08/2020 adenocarcinoma consistent with primary pancreatic malignancy.  CT abdomen and pelvis 09/24/2020 shows mets to peritoneum liver and lungs. MRCP 09/26/2020 with abrupt constriction of common hepatic duct with intra and extrahepatic biliary dilatation. Patient had ERCP  09/28/2020-S/P sphincterotomy and stent placement.  Her T bilirubin came down to 4.8 from 9.4 on admission.  She was able to tolerate a diet prior to discharge. Follow-up with oncology.   #Elevated LFTs likely secondary to mets.      #Hypothyroidism continue Synthroid   #Generalized anxiety disorder continue home meds  #Hyperlipidemia was on Crestor at home on hold due to elevated LFTs   #Hypertension continue home medications.  Estimated body mass index is 28.4 kg/m as calculated from the following:   Height as of this encounter: '5\' 5"'  (1.651 m).   Weight as of this encounter: 77.4 kg.  Discharge Instructions  Discharge Instructions     Call MD for:  difficulty breathing, headache or visual disturbances   Complete by: As directed    Call MD for:  persistant nausea and vomiting   Complete by: As directed    Call MD for:  temperature >100.4   Complete by: As directed    Diet - low sodium heart healthy   Complete by: As directed    Increase activity slowly   Complete by: As directed       Allergies as of 09/29/2020       Reactions   Penicillins Hives, Other (See Comments)   Has patient had a PCN reaction causing immediate rash, facial/tongue/throat swelling, SOB or lightheadedness with hypotension: No Has patient had a PCN reaction causing severe rash involving mucus membranes or skin necrosis: No Has patient had a PCN reaction that required hospitalization No Has patient had a PCN reaction occurring within the last 10 years: No If all of the above answers are "NO", then may proceed with Cephalosporin use.        Medication List     STOP taking these medications    HYDROcodone-acetaminophen 5-325 MG tablet Commonly known as: Norco   lidocaine-prilocaine cream Commonly known as: EMLA   rosuvastatin 5 MG tablet Commonly known as: Crestor       TAKE these medications  Accu-Chek Aviva Plus test strip Generic drug: glucose blood   Accu-Chek Guide w/Device Kit   Accu-Chek Softclix Lancets lancets   ALPRAZolam 0.5 MG tablet Commonly known as: XANAX Take 1 tablet (0.5 mg total) by mouth daily as needed for anxiety.   amLODipine 5 MG tablet Commonly known as: NORVASC Take 1  tablet (5 mg total) by mouth daily.   aspirin EC 81 MG tablet Take 81 mg by mouth daily.   cyanocobalamin 1000 MCG/ML injection Commonly known as: (VITAMIN B-12) INJECT 1 ML EVERY 2 WEEKS   docusate sodium 100 MG capsule Commonly known as: COLACE Take 100 mg by mouth 2 (two) times daily.   levothyroxine 125 MCG tablet Commonly known as: SYNTHROID TAKE 1 TABLET ON AN EMPTY STOMACH IN THE MORNING   MIRALAX PO Take by mouth.   omeprazole 20 MG capsule Commonly known as: PRILOSEC Take 1 capsule (20 mg total) by mouth daily.   ondansetron 8 MG tablet Commonly known as: Zofran Take 1 tablet (8 mg total) by mouth 2 (two) times daily as needed (Nausea or vomiting).   prochlorperazine 10 MG tablet Commonly known as: COMPAZINE Take 1 tablet (10 mg total) by mouth every 6 (six) hours as needed (Nausea or vomiting).        Allergies  Allergen Reactions   Penicillins Hives and Other (See Comments)    Has patient had a PCN reaction causing immediate rash, facial/tongue/throat swelling, SOB or lightheadedness with hypotension: No Has patient had a PCN reaction causing severe rash involving mucus membranes or skin necrosis: No Has patient had a PCN reaction that required hospitalization No Has patient had a PCN reaction occurring within the last 10 years: No If all of the above answers are "NO", then may proceed with Cephalosporin use.    Consultations: Gastroenterology Sadie Haber, oncology Dr. Benay Spice   Procedures/Studies: CT Chest Wo Contrast  Result Date: 09/08/2020 CLINICAL DATA:  Pancreatic cancer, staging. EXAM: CT CHEST WITHOUT CONTRAST TECHNIQUE: Multidetector CT imaging of the chest was performed following the standard protocol without IV contrast. COMPARISON:  MR abdomen 08/27/2020. FINDINGS: Cardiovascular: Atherosclerotic calcification of the aorta, aortic valve and coronary arteries. Heart size normal. No pericardial effusion. Mediastinum/Nodes: Mediastinal lymph nodes  are not enlarged by CT size criteria. Hilar regions are difficult to definitively evaluate without IV contrast but appear grossly unremarkable. Esophagus is grossly unremarkable. Small hiatal hernia. Lungs/Pleura: There are scattered small pulmonary nodules measuring up to 5 mm in the posterior left lower lobe (7/113). No pleural fluid. Airway is unremarkable. Upper Abdomen: Mildly hypodense lesions in the liver measure up to 1.9 cm along the periphery of the right hepatic lobe. Visualized portion of the gallbladder is unremarkable. Nodular thickening of both adrenal glands. Low-attenuation lesions in the kidneys measure up to 4.4 cm on the right, characterized as cysts on 08/27/2020. Spleen is unremarkable. Pancreatic body mass, better seen and described on 08/27/2020 MR abdomen. Musculoskeletal: Degenerative changes in the spine. No worrisome lytic or sclerotic lesions. IMPRESSION: 1. Scattered small pulmonary nodules, worrisome for metastatic disease. 2. Pancreatic body mass and hepatic metastases, better seen and described on MR abdomen 08/27/2020. 3. Aortic atherosclerosis (ICD10-I70.0). Coronary artery calcification. Electronically Signed   By: Lorin Picket M.D.   On: 09/08/2020 10:32   CT ABDOMEN PELVIS W CONTRAST  Result Date: 09/24/2020 CLINICAL DATA:  Abdominal pain, recent diagnosis pancreatic cancer EXAM: CT ABDOMEN AND PELVIS WITH CONTRAST TECHNIQUE: Multidetector CT imaging of the abdomen and pelvis was performed using the standard protocol  following bolus administration of intravenous contrast. CONTRAST:  72m OMNIPAQUE IOHEXOL 350 MG/ML SOLN COMPARISON:  MR abdomen, 08/27/2020 FINDINGS: Lower chest: No acute abnormality. Numerous small pulmonary nodules in the included bilateral lung bases, nonspecific, for example a 5 mm nodule in the peripheral left lower lobe (series 4, image 15) and a 4 mm nodule in the dependent right lower lobe (series 4, image 17). Hepatobiliary: Multiple hypoenhancing  masses of the liver parenchyma, not significant changed compared to prior MR. Contracted gallbladder. No gallstones or biliary dilatation. Pancreas: Redemonstrated hypoenhancing mass of the pancreatic neck and body, not significantly changed compared to prior examination (series 2, image 19). No pancreatic ductal dilatation or surrounding inflammatory changes. Spleen: Normal in size without significant abnormality. Adrenals/Urinary Tract: Adrenal glands are unremarkable. Kidneys are normal, without renal calculi, solid lesion, or hydronephrosis. Bladder is unremarkable. Stomach/Bowel: Stomach is within normal limits. Appendix appears normal. No evidence of bowel wall thickening, distention, or inflammatory changes. Sigmoid diverticulosis Vascular/Lymphatic: Aortic atherosclerosis. No enlarged abdominal or pelvic lymph nodes. Reproductive: Status post hysterectomy. Other: No abdominal wall hernia or abnormality. Omental and peritoneal stranding and nodularity, consistent with peritoneal metastatic disease, not significantly changed (series 2, image 31, 49, 40). No abdominopelvic ascites. Musculoskeletal: No acute or significant osseous findings. IMPRESSION: 1. No acute CT findings of the abdomen or pelvis to explain pain. 2. Redemonstrated hypoenhancing mass of the pancreatic neck and body, not significantly changed compared to prior examination , consistent with primary pancreatic malignancy. 3. Omental and peritoneal stranding and nodularity, consistent with peritoneal metastatic disease, not significantly changed compared to prior examination. 4. Multiple hypoenhancing liver masses, consistent with hepatic metastatic disease, not significantly changed compared to prior examination. 5. Numerous small pulmonary nodules in the included bilateral lung bases, not well identified on prior MR although highly suspicious for pulmonary metastatic disease . Aortic Atherosclerosis (ICD10-I70.0). Electronically Signed   By:  AEddie CandleM.D.   On: 09/24/2020 14:03   MR 3D Recon At Scanner  Result Date: 09/26/2020 CLINICAL DATA:  Jaundice.  Pancreatic cancer.  Liver metastasis EXAM: MRI ABDOMEN WITHOUT AND WITH CONTRAST (INCLUDING MRCP) TECHNIQUE: Multiplanar multisequence MR imaging of the abdomen was performed both before and after the administration of intravenous contrast. Heavily T2-weighted images of the biliary and pancreatic ducts were obtained, and three-dimensional MRCP images were rendered by post processing. CONTRAST:  This COMPARISON:  MRI 08/27/2020, CT 09/24/2020 gauge FINDINGS: Lower chest:  Lung bases are clear. Hepatobiliary: On the MRCP sequence, there is new abrupt obstruction of the common hepatic duct (image 37/series 13). There is new extra and intrahepatic biliary duct dilatation. There is new obstruction of the common hepatic duct relates to expansion of the pancreatic mass medially into the porta hepatis (image 15/5). Multiple rim enhancing hepatic lesions again demonstrated consistent metastatic disease. There are 5 lesions in the RIGHT hepatic lobe. Lesion increased moderately from MRI 08/27/2020. Example RIGHT hepatic lobe lesion measures 26 mm increased from 18 mm. Superior RIGHT hepatic lobe lesion measures 20 mm compared to 12 mm. Pancreas: Long lesion within the body the pancreas measuring 6.0 x 1.9 cm consistent primary adenocarcinoma (image 34/20 Spleen: Normal spleen. Adrenals/urinary tract: Adrenal glands and kidneys are normal. Stomach/Bowel: Stomach and limited of the small bowel is unremarkable Vascular/Lymphatic: Abdominal aortic normal caliber. No retroperitoneal periportal lymphadenopathy. Musculoskeletal: No aggressive osseous lesion IMPRESSION: 1. New abrupt constriction of the common hepatic duct. The infiltrative pancreatic mass has extending medially in the porta hepatis to obstruct the common hepatic duct. 2.  Increase in intrahepatic and extrahepatic biliary duct dilatation explains  patient's jaundice. 3. Multiple RIGHT hepatic lobe metastasis mildly increased in size from 08/27/2020. 4. Broad lesion within the pancreatic body consistent with primary pancreatic carcinoma. Electronically Signed   By: Suzy Bouchard M.D.   On: 09/26/2020 21:50   US BIOPSY (LIVER)  Result Date: 09/08/2020 INDICATION: Pancreatic mass, liver lesions and peritoneal nodules. Suspicion for metastatic pancreatic carcinoma. EXAM: ULTRASOUND GUIDED CORE BIOPSY OF LIVER MEDICATIONS: None. ANESTHESIA/SEDATION: Fentanyl 100 mcg IV; Versed 3.0 mg IV Moderate Sedation Time:  13 minutes. The patient was continuously monitored during the procedure by the interventional radiology nurse under my direct supervision. PROCEDURE: The procedure, risks, benefits, and alternatives were explained to the patient. Questions regarding the procedure were encouraged and answered. The patient understands and consents to the procedure. A time-out was performed prior to initiating the procedure. The right abdominal wall was prepped with chlorhexidine in a sterile fashion, and a sterile drape was applied covering the operative field. A sterile gown and sterile gloves were used for the procedure. Local anesthesia was provided with 1% Lidocaine. After localizing a lesion within the right lobe of the liver, a 17 gauge trocar needle was advanced to the margin of the lesion. Three separate coaxial 18 gauge core biopsy samples were obtained and submitted in formalin. The outer needle was removed and additional ultrasound performed. COMPLICATIONS: None immediate. FINDINGS: The more inferior subcapsular right lobe liver lesion was well visualized and measures approximately 1.9 cm in greatest diameter. The higher right lobe lesion closer to the dome is only partially visualized and was not a good target for biopsy. Solid tissue was obtained from the sampled lesion. IMPRESSION: Ultrasound-guided core biopsy performed of an inferior subcapsular right  lobe liver lesion measuring approximately 1.9 cm in greatest diameter by ultrasound. Electronically Signed   By: Aletta Edouard M.D.   On: 09/08/2020 15:05   DG ERCP BILIARY & PANCREATIC DUCTS  Result Date: 09/28/2020 CLINICAL DATA:  Pancreatic carcinoma, jaundice, liver metastases, common hepatic duct obstruction on MRCP EXAM: ERCP TECHNIQUE: Multiple spot images obtained with the fluoroscopic device and submitted for interpretation post-procedure. COMPARISON:  MRCP 09/26/2020 FINDINGS: A series of fluoroscopic spot images document endoscopic cannulation and opacification of the biliary tree. Cystic duct is patent. There is tapered narrowing of the common hepatic duct near the level of the cystic duct confluence. Subsequent images document deployment of metallic biliary stent through the length of the common duct with persistent tapered narrowing in its midportion. IMPRESSION: Endoscopic CBD cannulation and intervention as above. These images were submitted for radiologic interpretation only. Please see the procedural report for the amount of contrast and the fluoroscopy time utilized. Electronically Signed   By: Lucrezia Europe M.D.   On: 09/28/2020 13:30   MR ABDOMEN MRCP W WO CONTAST  Result Date: 09/26/2020 CLINICAL DATA:  Jaundice.  Pancreatic cancer.  Liver metastasis EXAM: MRI ABDOMEN WITHOUT AND WITH CONTRAST (INCLUDING MRCP) TECHNIQUE: Multiplanar multisequence MR imaging of the abdomen was performed both before and after the administration of intravenous contrast. Heavily T2-weighted images of the biliary and pancreatic ducts were obtained, and three-dimensional MRCP images were rendered by post processing. CONTRAST:  This COMPARISON:  MRI 08/27/2020, CT 09/24/2020 gauge FINDINGS: Lower chest:  Lung bases are clear. Hepatobiliary: On the MRCP sequence, there is new abrupt obstruction of the common hepatic duct (image 37/series 13). There is new extra and intrahepatic biliary duct dilatation. There is  new obstruction of the common hepatic  duct relates to expansion of the pancreatic mass medially into the porta hepatis (image 15/5). Multiple rim enhancing hepatic lesions again demonstrated consistent metastatic disease. There are 5 lesions in the RIGHT hepatic lobe. Lesion increased moderately from MRI 08/27/2020. Example RIGHT hepatic lobe lesion measures 26 mm increased from 18 mm. Superior RIGHT hepatic lobe lesion measures 20 mm compared to 12 mm. Pancreas: Long lesion within the body the pancreas measuring 6.0 x 1.9 cm consistent primary adenocarcinoma (image 34/20 Spleen: Normal spleen. Adrenals/urinary tract: Adrenal glands and kidneys are normal. Stomach/Bowel: Stomach and limited of the small bowel is unremarkable Vascular/Lymphatic: Abdominal aortic normal caliber. No retroperitoneal periportal lymphadenopathy. Musculoskeletal: No aggressive osseous lesion IMPRESSION: 1. New abrupt constriction of the common hepatic duct. The infiltrative pancreatic mass has extending medially in the porta hepatis to obstruct the common hepatic duct. 2. Increase in intrahepatic and extrahepatic biliary duct dilatation explains patient's jaundice. 3. Multiple RIGHT hepatic lobe metastasis mildly increased in size from 08/27/2020. 4. Broad lesion within the pancreatic body consistent with primary pancreatic carcinoma. Electronically Signed   By: Suzy Bouchard M.D.   On: 09/26/2020 21:50   IR IMAGING GUIDED PORT INSERTION  Result Date: 09/22/2020 CLINICAL DATA:  Pancreatic carcinoma, needs durable venous access for planned treatment regimen EXAM: TUNNELED PORT CATHETER PLACEMENT WITH ULTRASOUND AND FLUOROSCOPIC GUIDANCE FLUOROSCOPY TIME:  48 seconds; 6 mGy ANESTHESIA/SEDATION: Intravenous Fentanyl 148mg and Versed 242mwere administered as conscious sedation during continuous monitoring of the patient's level of consciousness and physiological / cardiorespiratory status by the radiology RN, with a total moderate  sedation time of 12 minutes. TECHNIQUE: The procedure, risks, benefits, and alternatives were explained to the patient. Questions regarding the procedure were encouraged and answered. The patient understands and consents to the procedure. Patency of the right IJ vein was confirmed with ultrasound with image documentation. An appropriate skin site was determined. Skin site was marked. Region was prepped using maximum barrier technique including cap and mask, sterile gown, sterile gloves, large sterile sheet, and Chlorhexidine as cutaneous antisepsis. The region was infiltrated locally with 1% lidocaine. Under real-time ultrasound guidance, the right IJ vein was accessed with a 21 gauge micropuncture needle; the needle tip within the vein was confirmed with ultrasound image documentation. Needle was exchanged over a 018 guidewire for transitional dilator, and vascular measurement was performed. A small incision was made on the right anterior chest wall and a subcutaneous pocket fashioned. The power-injectable port was positioned and its catheter tunneled to the right IJ dermatotomy site. The transitional dilator was exchanged over an Amplatz wire for a peel-away sheath, through which the port catheter, which had been trimmed to the appropriate length, was advanced and positioned under fluoroscopy with its tip at the cavoatrial junction. Spot chest radiograph confirms good catheter position and no pneumothorax. The port was flushed per protocol. The pocket was closed with deep interrupted and subcuticular continuous 3-0 Monocryl sutures. The incisions were covered with Dermabond then covered with a sterile dressing. The patient tolerated the procedure well. COMPLICATIONS: COMPLICATIONS None immediate IMPRESSION: Technically successful right IJ power-injectable port catheter placement. Ready for routine use. Electronically Signed   By: D Lucrezia Europe.D.   On: 09/22/2020 14:42   (Echo, Carotid, EGD, Colonoscopy, ERCP)     Subjective: Patient resting in bed in no acute distress anxious to go home.  Discharge Exam: Vitals:   09/28/20 2009 09/29/20 0425  BP: (!) 163/93 (!) 145/73  Pulse: 73 74  Resp: 16 16  Temp: 97.8 F (  36.6 C) 97.6 F (36.4 C)  SpO2: 95% 97%   Vitals:   09/28/20 1310 09/28/20 1358 09/28/20 2009 09/29/20 0425  BP: (!) 193/97 (!) 186/95 (!) 163/93 (!) 145/73  Pulse:  75 73 74  Resp:  '16 16 16  ' Temp:  97.9 F (36.6 C) 97.8 F (36.6 C) 97.6 F (36.4 C)  TempSrc:  Oral Oral Oral  SpO2:  99% 95% 97%  Weight:      Height:        General: Pt is alert, awake, not in acute distress Cardiovascular: RRR, S1/S2 +, no rubs, no gallops Respiratory: CTA bilaterally, no wheezing, no rhonchi Abdominal: Soft, NT, ND, bowel sounds + Extremities: no edema, no cyanosis    The results of significant diagnostics from this hospitalization (including imaging, microbiology, ancillary and laboratory) are listed below for reference.     Microbiology: Recent Results (from the past 240 hour(s))  Surgical pcr screen     Status: Abnormal   Collection Time: 09/27/20 10:00 PM   Specimen: Nasal Mucosa; Nasal Swab  Result Value Ref Range Status   MRSA, PCR NEGATIVE NEGATIVE Final   Staphylococcus aureus POSITIVE (A) NEGATIVE Final    Comment: (NOTE) The Xpert SA Assay (FDA approved for NASAL specimens in patients 50 years of age and older), is one component of a comprehensive surveillance program. It is not intended to diagnose infection nor to guide or monitor treatment. Performed at [facility], [place] [address] [place], [place] [address]   Resp Panel by RT-PCR (Flu A&B, Covid) Nasopharyngeal Swab     Status: None   Collection Time: 09/28/20  8:37 AM   Specimen: Nasopharyngeal Swab; Nasopharyngeal(NP) swabs in vial transport medium  Result Value Ref Range Status   SARS Coronavirus 2 by RT PCR NEGATIVE NEGATIVE Final    Comment: (NOTE) SARS-CoV-2 target nucleic acids  are NOT DETECTED.  The SARS-CoV-2 RNA is generally detectable in upper respiratory specimens during the acute phase of infection. The lowest concentration of SARS-CoV-2 viral copies this assay can detect is 138 copies/mL. A negative result does not preclude SARS-Cov-2 infection and should not be used as the sole basis for treatment or other patient management decisions. A negative result may occur with  improper specimen collection/handling, submission of specimen other than nasopharyngeal swab, presence of viral mutation(s) within the areas targeted by this assay, and inadequate number of viral copies(<138 copies/mL). A negative result must be combined with clinical observations, patient history, and epidemiological information. The expected result is Negative.  Fact Sheet for Patients:  EntrepreneurPulse.com.au  Fact Sheet for Healthcare Providers:  IncredibleEmployment.be  This test is no t yet approved or cleared by the [place] FDA and  has been authorized for detection and/or diagnosis of SARS-CoV-2 by FDA under an Emergency Use Authorization (EUA). This EUA will remain  in effect (meaning this test can be used) for the duration of the COVID-19 declaration under Section 564(b)(1) of the Act, 21 U.S.C.section 360bbb-3(b)(1), unless the authorization is terminated  or revoked sooner.       Influenza A by PCR NEGATIVE NEGATIVE Final   Influenza B by PCR NEGATIVE NEGATIVE Final    Comment: (NOTE) The Xpert Xpress SARS-CoV-2/FLU/RSV plus assay is intended as an aid in the diagnosis of influenza from Nasopharyngeal swab specimens and should not be used as a sole basis for treatment. Nasal washings and aspirates are unacceptable for Xpert Xpress SARS-CoV-2/FLU/RSV testing.  Fact Sheet for Patients: EntrepreneurPulse.com.au  Fact Sheet for Healthcare  Providers: IncredibleEmployment.be  This test is  not yet approved or cleared by the [place] and has been authorized for detection and/or diagnosis of SARS-CoV-2 by FDA under an Emergency Use Authorization (EUA). This EUA will remain in effect (meaning this test can be used) for the duration of the COVID-19 declaration under Section 564(b)(1) of the Act, 21 U.S.C. section 360bbb-3(b)(1), unless the authorization is terminated or revoked.  Performed at [facility], [place] [address] [place], [place] [address]      Labs: BNP (last 3 results) No results for input(s): BNP in the last 8760 hours. Basic Metabolic Panel: Recent Labs  Lab 09/25/20 0511 09/26/20 0508 09/27/20 0517 09/28/20 0514 09/29/20 0547  NA 135 136 137 137 135  K 3.9 3.7 3.5 3.7 3.6  CL 107 105 107 106 104  CO2 19* 21* 20* 22 21*  GLUCOSE 169* 156* 161* 136* 123*  BUN 7* 7* '9 10 11  ' CREATININE 0.54 0.56 0.53 0.54 0.62  CALCIUM 9.2 9.3 9.4 9.0 9.0  MG  --  1.7 1.9 1.9 1.8   Liver Function Tests: Recent Labs  Lab 09/25/20 0511 09/26/20 0508 09/27/20 0517 09/28/20 0514 09/29/20 0547  AST 274* 260* 189* 134* 104*  ALT 402* 438* 356* 283* 233*  ALKPHOS 203* 228* 232* 236* 224*  BILITOT 6.5* 8.5* 9.4* 9.2* 4.7*  PROT 6.3* 6.7 6.7 6.1* 6.0*  ALBUMIN 3.6 3.7 3.7 3.3* 3.2*   Recent Labs  Lab 09/24/20 1029  LIPASE 34   No results for input(s): AMMONIA in the last 168 hours. CBC: Recent Labs  Lab 09/24/20 1029 09/25/20 0511 09/26/20 0508 09/27/20 0517 09/28/20 0514 09/29/20 0547  WBC 6.6 4.7 4.8 4.4 4.8 7.0  NEUTROABS 4.3  --  2.8 2.6 2.8 4.6  HGB 15.4* 14.0 14.1 14.1 13.5 13.4  HCT 45.3 42.6 42.7 43.2 40.6 39.5  MCV 90.6 93.0 92.6 93.3 93.3 92.7  PLT 160 141* 130* 134* 134* 145*   Cardiac Enzymes: No results for input(s): CKTOTAL, CKMB, CKMBINDEX, TROPONINI in the last 168 hours. BNP: Invalid input(s): POCBNP CBG: Recent Labs  Lab 09/27/20 2129 09/28/20 0816 09/28/20 1755 09/28/20 2221 09/29/20 0737   GLUCAP 135* 135* 167* 179* 125*   D-Dimer No results for input(s): DDIMER in the last 72 hours. Hgb A1c No results for input(s): HGBA1C in the last 72 hours. Lipid Profile No results for input(s): CHOL, HDL, LDLCALC, TRIG, CHOLHDL, LDLDIRECT in the last 72 hours. Thyroid function studies No results for input(s): TSH, T4TOTAL, T3FREE, THYROIDAB in the last 72 hours.  Invalid input(s): FREET3 Anemia work up No results for input(s): VITAMINB12, FOLATE, FERRITIN, TIBC, IRON, RETICCTPCT in the last 72 hours. Urinalysis    Component Value Date/Time   COLORURINE AMBER (A) 09/24/2020 0907   APPEARANCEUR CLEAR 09/24/2020 0907   LABSPEC 1.000 (L) 09/24/2020 0907   PHURINE 6.0 09/24/2020 0907   GLUCOSEU >=500 (A) 09/24/2020 0907   HGBUR NEGATIVE 09/24/2020 0907   BILIRUBINUR NEGATIVE 09/24/2020 0907   KETONESUR NEGATIVE 09/24/2020 0907   PROTEINUR NEGATIVE 09/24/2020 0907   NITRITE NEGATIVE 09/24/2020 0907   LEUKOCYTESUR NEGATIVE 09/24/2020 4076   Sepsis Labs Invalid input(s): PROCALCITONIN,  WBC,  LACTICIDVEN Microbiology Recent Results (from the past 240 hour(s))  Surgical pcr screen     Status: Abnormal   Collection Time: 09/27/20 10:00 PM   Specimen: Nasal Mucosa; Nasal Swab  Result Value Ref Range Status   MRSA, PCR NEGATIVE NEGATIVE Final   Staphylococcus aureus POSITIVE (A)  NEGATIVE Final    Comment: (NOTE) The Xpert SA Assay (FDA approved for NASAL specimens in patients 49 years of age and older), is one component of a comprehensive surveillance program. It is not intended to diagnose infection nor to guide or monitor treatment. Performed at [facility], [place] [address], [place], [place] [address]   Resp Panel by RT-PCR (Flu A&B, Covid) Nasopharyngeal Swab     Status: None   Collection Time: 09/28/20  8:37 AM   Specimen: Nasopharyngeal Swab; Nasopharyngeal(NP) swabs in vial transport medium  Result Value Ref Range Status   SARS Coronavirus 2 by RT  PCR NEGATIVE NEGATIVE Final    Comment: (NOTE) SARS-CoV-2 target nucleic acids are NOT DETECTED.  The SARS-CoV-2 RNA is generally detectable in upper respiratory specimens during the acute phase of infection. The lowest concentration of SARS-CoV-2 viral copies this assay can detect is 138 copies/mL. A negative result does not preclude SARS-Cov-2 infection and should not be used as the sole basis for treatment or other patient management decisions. A negative result may occur with  improper specimen collection/handling, submission of specimen other than nasopharyngeal swab, presence of viral mutation(s) within the areas targeted by this assay, and inadequate number of viral copies(<138 copies/mL). A negative result must be combined with clinical observations, patient history, and epidemiological information. The expected result is Negative.  Fact Sheet for Patients:  EntrepreneurPulse.com.au  Fact Sheet for Healthcare Providers:  IncredibleEmployment.be  This test is no t yet approved or cleared by the [place] FDA and  has been authorized for detection and/or diagnosis of SARS-CoV-2 by FDA under an Emergency Use Authorization (EUA). This EUA will remain  in effect (meaning this test can be used) for the duration of the COVID-19 declaration under Section 564(b)(1) of the Act, 21 U.S.C.section 360bbb-3(b)(1), unless the authorization is terminated  or revoked sooner.       Influenza A by PCR NEGATIVE NEGATIVE Final   Influenza B by PCR NEGATIVE NEGATIVE Final    Comment: (NOTE) The Xpert Xpress SARS-CoV-2/FLU/RSV plus assay is intended as an aid in the diagnosis of influenza from Nasopharyngeal swab specimens and should not be used as a sole basis for treatment. Nasal washings and aspirates are unacceptable for Xpert Xpress SARS-CoV-2/FLU/RSV testing.  Fact Sheet for Patients: EntrepreneurPulse.com.au  Fact Sheet for  Healthcare Providers: IncredibleEmployment.be  This test is not yet approved or cleared by the [place] FDA and has been authorized for detection and/or diagnosis of SARS-CoV-2 by FDA under an Emergency Use Authorization (EUA). This EUA will remain in effect (meaning this test can be used) for the duration of the COVID-19 declaration under Section 564(b)(1) of the Act, 21 U.S.C. section 360bbb-3(b)(1), unless the authorization is terminated or revoked.  Performed at [facility], [place] [address] [place], [place] [address]      Time coordinating discharge: 39 minutes  SIGNED:   Georgette Shell, MD  [facility] 09/29/2020, 10:10 AM

## 2020-09-29 NOTE — Telephone Encounter (Signed)
pt called form hospital to review med list. all questions answered.

## 2020-09-29 NOTE — Telephone Encounter (Signed)
Patient called in stating that she has been in the hospital for 6 days and she had some questions about medication. I told her that I would send a message to Dr Tamela Oddi team about the medication but we should do a hospital follow up next week. Patient then got furious and said that she would find another doctor and proceeded to hang up the phone.

## 2020-09-29 NOTE — Telephone Encounter (Signed)
Patient has reached out to oncology to have all medication questions reviewed.

## 2020-09-29 NOTE — Progress Notes (Signed)
Physical Therapy Treatment Patient Details Name: [name] MRN: [id number] DOB: 1946/03/03 Today's Date: 09/29/2020    History of Present Illness 75 yo female admitted with metastatic pancreatic ca, hyperbilirubinemia, back pain    PT Comments    Pt is progressing with acute PT goals with slightly increased ambulation distance today with MIN guard progressing to close supervision for safety with use of RW and performance of 5x sit to stand. Pt with 5x sit to stand of 21.70s classifying pt as increased fall risk. Pt continues to express goal of improving endurance as she feels her strength and activity tolerance is below baseline. Pt will benefit from skilled PT to increase their independence and safety with mobility to allow discharge to the venue listed below.      Follow Up Recommendations  Home health PT;Supervision/Assistance - 24 hour     Equipment Recommendations  Rolling walker with 5" wheels    Recommendations for Other Services       Precautions / Restrictions Precautions Precautions: Fall Restrictions Weight Bearing Restrictions: No    Mobility  Bed Mobility Overal bed mobility: Needs Assistance Bed Mobility: Supine to Sit     Supine to sit: Supervision;HOB elevated     General bed mobility comments: supervision for safety with slightly increased time    Transfers Overall transfer level: Needs assistance Equipment used: Rolling walker (2 wheeled) Transfers: Sit to/from Stand Sit to Stand: Min guard         General transfer comment: Min guard for safety with cues for safe hand placement  Ambulation/Gait Ambulation/Gait assistance: Min guard;Supervision Gait Distance (Feet): 360 Feet Assistive device: Rolling walker (2 wheeled) Gait Pattern/deviations: Step-through pattern;Decreased stride length Gait velocity: decr   General Gait Details: Min guard progressing to supervision for safety after ~281ft. Pt with reported fatigue and noticable LE  weakness with further distance, no LOB observed.   Stairs             Wheelchair Mobility    Modified Rankin (Stroke Patients Only)       Balance Overall balance assessment: Needs assistance Sitting-balance support: Feet supported Sitting balance-Leahy Scale: Good     Standing balance support: Bilateral upper extremity supported Standing balance-Leahy Scale: Poor Standing balance comment: use of external support for standing balance                            Cognition Arousal/Alertness: Awake/alert Behavior During Therapy: WFL for tasks assessed/performed Overall Cognitive Status: Within Functional Limits for tasks assessed                                        Exercises      General Comments General comments (skin integrity, edema, etc.): 5x sit to stand: 21.70s, use of B UEs for power up to stand      Pertinent Vitals/Pain Pain Assessment: 0-10 Faces Pain Scale: Hurts a little bit Pain Location: abdomen Pain Descriptors / Indicators: Discomfort Pain Intervention(s): Limited activity within patient's tolerance;Monitored during session;Repositioned    Home Living                      Prior Function            PT Goals (current goals can now be found in the care plan section) Acute Rehab PT Goals Patient Stated Goal: "move more  to help my arthritis" PT Goal Formulation: With patient/family Time For Goal Achievement: 10/10/20 Potential to Achieve Goals: Good Progress towards PT goals: Progressing toward goals    Frequency    Min 3X/week      PT Plan Current plan remains appropriate    Co-evaluation              AM-PAC PT "6 Clicks" Mobility   Outcome Measure  Help needed turning from your back to your side while in a flat bed without using bedrails?: A Little Help needed moving from lying on your back to sitting on the side of a flat bed without using bedrails?: A Little Help needed moving to  and from a bed to a chair (including a wheelchair)?: A Little Help needed standing up from a chair using your arms (e.g., wheelchair or bedside chair)?: A Little Help needed to walk in hospital room?: A Little Help needed climbing 3-5 steps with a railing? : A Little 6 Click Score: 18    End of Session Equipment Utilized During Treatment: Gait belt Activity Tolerance: Patient tolerated treatment well Patient left: in bed;with call bell/phone within reach;with family/visitor present Nurse Communication: Mobility status PT Visit Diagnosis: Difficulty in walking, not elsewhere classified (R26.2);Muscle weakness (generalized) (M62.81)     Time: 5009-3818 PT Time Calculation (min) (ACUTE ONLY): 23 min  Charges:  $Therapeutic Activity: 23-37 mins                     Festus Barren PT, DPT  Acute Rehabilitation Services  Office [phone]    09/29/2020, 12:56 PM

## 2020-09-29 NOTE — TOC Initial Note (Signed)
Transition of Care [facility]) - Initial/Assessment Note    Patient Details  Name: [name] MRN: [id number] Date of Birth: 09-19-1945  Transition of Care [facility]) CM/SW Contact:    Kathyleen Radice, Marjie Skiff, RN Phone Number: 09/29/2020, 1:24 PM  Clinical Narrative:                   Expected Discharge Plan: [place] Barriers to Discharge: No Barriers Identified   Patient Goals and CMS Choice Patient states their goals for this hospitalization and ongoing recovery are:: To go home CMS Medicare.gov Compare Post Acute Care list provided to:: Patient Choice offered to / list presented to : Patient  Expected Discharge Plan and Services Expected Discharge Plan: [place]   Discharge Planning Services: CM Consult Post Acute Care Choice: [place] arrangements for the past 2 months: Single Family Home Expected Discharge Date: 09/29/20               DME Arranged: 3-N-1, [place] Rile rolling, Wheelchair manual DME Agency: [employer] Date DME Agency Contacted: 09/29/20   Representative spoke with at DME Agency: [place]: PT [place]: [place] Date [place]: 09/29/20   Representative spoke with at [place]: Tommi Rumps  Prior Living Arrangements/Services Living arrangements for the past 2 months: [place] Lives with:: Spouse Patient language and need for interpreter reviewed:: Yes Do you feel safe going back to the place where you live?: Yes      Need for Family Participation in Patient Care: Yes (Comment)     Criminal Activity/Legal Involvement Pertinent to Current Situation/Hospitalization: No - Comment as needed  Activities of Daily Living Home Assistive Devices/Equipment: CBG Meter, Blood pressure cuff, Scales, Eyeglasses, Walker (specify type) ADL Screening (condition at time of admission) Patient's cognitive ability adequate to safely complete daily activities?: Yes Is the patient deaf or have  difficulty hearing?: No Does the patient have difficulty seeing, even when wearing glasses/contacts?: No Does the patient have difficulty concentrating, remembering, or making decisions?: No Patient able to express need for assistance with ADLs?: Yes Does the patient have difficulty dressing or bathing?: No Independently performs ADLs?: Yes (appropriate for developmental age) Does the patient have difficulty walking or climbing stairs?: Yes Weakness of Legs: Both Weakness of Arms/Hands: Both  Permission Sought/Granted Permission sought to share information with : Facility Art therapist granted to share information with : Yes, Verbal Permission Granted     Permission granted to share info w AGENCY: [facility]        Emotional Assessment Appearance:: Appears stated age Attitude/Demeanor/Rapport: Engaged Affect (typically observed): Calm Orientation: : Oriented to Self, Oriented to Place, Oriented to  Time, Oriented to Situation Alcohol / Substance Use: Not Applicable Psych Involvement: No (comment)  Admission diagnosis:  Hyperbilirubinemia [E80.6] H/O pancreatic cancer [Z85.07] Pancreatic adenocarcinoma ([place]) [C25.9] Patient Active Problem List   Diagnosis Date Noted   Pancreatic adenocarcinoma ([place]) 09/25/2020   Jaundice, perinatal, from hepatocellular damage 09/24/2020   Elevated LFTs 09/24/2020   Dark urine 09/24/2020   Dehydration 09/24/2020   Hyperbilirubinemia 09/24/2020   Primary pancreatic cancer with metastasis to other site ([place]) 09/12/2020   Alcohol use, 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[place]) 08/18/2020   Vitamin B12 deficiency 10/20/2019   GAD (generalized anxiety disorder) 10/20/2019   Major depression, recurrent, chronic ([place]) 10/20/2019   Diet-controlled diabetes mellitus ([place]) 10/20/2019   Osteoarthritis, multiple sites 10/20/2019   Presbycusis of both ears 04/13/2019  Essential tremor 04/13/2019   Bilateral primary  osteoarthritis of knee 09/23/2017   Acquired hypothyroidism 05/24/2012   Alcohol abuse, daily use 05/24/2012   Essential hypertension    Mixed hyperlipidemia    PCP:  [name], MD Pharmacy:   [id number] - [place], [place] - [address] [place] [address] Phone: [phone] Fax: [phone]     Social Determinants of Health (SDOH) Interventions    Readmission Risk Interventions No flowsheet data found.

## 2020-09-29 NOTE — Telephone Encounter (Signed)
Scheduled appt per 7/15 sch msg - pt is aware of appt date and time .

## 2020-09-29 NOTE — Progress Notes (Addendum)
IP PROGRESS NOTE  Subjective:   [name] is feeling much better this morning. Denies abdominal pain, nausea, vomiting.   Objective: Vital signs in last 24 hours: Blood pressure (!) 145/73, pulse 74, temperature 97.6 F (36.4 C), temperature source Oral, resp. rate 16, height 5\' 5"  (1.651 m), weight 77.4 kg, SpO2 97 %.  Intake/Output from previous day: 07/14 0701 - 07/15 0700 In: 1595 [P.O.:120; I.V.:1475] Out: 1104 [Urine:1103; Stool:1]  Physical Exam:  HEENT: Scleral icterus, improving Lungs: Clear bilaterally Cardiac: Regular rate and rhythm Abdomen: No hepatosplenomegaly, no mass, nontender Extremities: No leg edema  Portacath/PICC-without erythema  Lab Results: Recent Labs    09/28/20 0514 09/29/20 0547  WBC 4.8 7.0  HGB 13.5 13.4  HCT 40.6 39.5  PLT 134* 145*    BMET Recent Labs    09/28/20 0514 09/29/20 0547  NA 137 135  K 3.7 3.6  CL 106 104  CO2 22 21*  GLUCOSE 136* 123*  BUN 10 11  CREATININE 0.54 0.62  CALCIUM 9.0 9.0    Lab Results  Component Value Date   CAN199 4,951 (H) 08/31/2020    Studies/Results: DG ERCP BILIARY & PANCREATIC DUCTS  Result Date: 09/28/2020 CLINICAL DATA:  Pancreatic carcinoma, jaundice, liver metastases, common hepatic duct obstruction on MRCP EXAM: ERCP TECHNIQUE: Multiple spot images obtained with the fluoroscopic device and submitted for interpretation post-procedure. COMPARISON:  MRCP 09/26/2020 FINDINGS: A series of fluoroscopic spot images document endoscopic cannulation and opacification of the biliary tree. Cystic duct is patent. There is tapered narrowing of the common hepatic duct near the level of the cystic duct confluence. Subsequent images document deployment of metallic biliary stent through the length of the common duct with persistent tapered narrowing in its midportion. IMPRESSION: Endoscopic CBD cannulation and intervention as above. These images were submitted for radiologic interpretation only. Please  see the procedural report for the amount of contrast and the fluoroscopy time utilized. Electronically Signed   By: Lucrezia Europe M.D.   On: 09/28/2020 13:30    Medications: I have reviewed the patient's current medications.  Assessment/Plan: Adenocarcinoma the pancreas body, stage IV (cT4,cN0,pM1) Ultrasound abdomen 08/18/2020-possible hypoechoic pancreas body mass, small hypoechoic liver areas MRI abdomen 08/27/2020-pancreas body mass, multiple hepatic lesions consistent with metastases, right abdominal omental nodularity, pancreas mass extends to the celiac bifurcation and abuts the splenic vein and splenoportal confluence CT chest 09/07/2020-scattered pulmonary nodules concerning for metastases, pancreas body mass, hepatic metastases Ultrasound-guided biopsy of a right liver lesion 09/08/2020-adenocarcinoma consistent with a pancreas primary CT abdomen/pelvis 09/24/2020-pancreas neck mass, omental and peritoneal nodularity-unchanged, multiple hypoenhancing liver masses-unchanged, numerous small pulmonary nodules   2.  Pain secondary #1 3.  Diabetes 4.  Hypertension 5.  Osteoarthritis 6.  Port-A-Cath placement 09/22/2020 7.  Admission with jaundice 09/24/2020  [name] underwent ERCP with stent placement 7/14.  Bilirubin has improved to 4.7 this morning.  She is tolerating her diet well.  She anticipates discharged home later today.  She has a new diagnosis of metastatic pancreatic cancer and is due to begin chemotherapy with gemcitabine Abraxane.  She missed her first treatment this past week due to hospital admission.  We will reschedule labs, visit, and gemcitabine/Abraxane for next Thursday.  The patient will be contacted by our office with a date and time.  Recommendations: Okay to discharge to home from our standpoint. We will reschedule her for labs, visit, and gemcitabine/Abraxane next Thursday in our office.  She will be contacted with the date and time.  LOS: 4 days   Toni Bussing, [name]   09/29/2020, 12:14 PM [name] underwent an ERCP yesterday.  I reviewed the procedure note.  The bilirubin has improved this morning.  I discussed the treatment plan with her.  We will need to wait for the bilirubin to improve further in order to begin systemic therapy.  Outpatient follow-up will be scheduled at the Cancer center with the plan to begin gemcitabine/Abraxane on 10/05/2020.  I was present for greater than 50% of today's visit.  I performed medical decision making.  Julieanne Manson, MD

## 2020-10-02 ENCOUNTER — Telehealth: Payer: Self-pay

## 2020-10-02 NOTE — Telephone Encounter (Signed)
pt called to review meds.  all meds reviewed and questions answered

## 2020-10-02 NOTE — Telephone Encounter (Cosign Needed)
Transition Care Management Follow-up Telephone Call Date of discharge and from where: [facility] 09/29/20 How have you been since you were released from the hospital? Not well  Any questions or concerns? No  Items Reviewed: Did the pt receive and understand the discharge instructions provided? Yes  Medications obtained and verified? Yes  Other? No  Any new allergies since your discharge? No  Dietary orders reviewed? Yes Do you have support at home? Yes   Home Care and Equipment/Supplies: Were home health services ordered? yes If so, what is the name of the agency?  Patient wants to wait on PT at this time Has the agency set up a time to come to the patient's home? no Were any new equipment or medical supplies ordered?  No What is the name of the medical supply agency?  Were you able to get the supplies/equipment? not applicable Do you have any questions related to the use of the equipment or supplies? No  Functional Questionnaire: (I = Independent and D = Dependent) ADLs: i  Bathing/Dressing- i  Meal Prep- i  Eating- i  Maintaining continence- i  Transferring/Ambulation- i  Managing Meds- i  Follow up appointments reviewed:  PCP Hospital f/u appt confirmed? Yes  Scheduled to see Dr Jonni Sanger  on July 27 th  @ 11:30. [facility] f/u appt confirmed? Yes  Scheduled to see lab and infusion  on 10/05/20 Are transportation arrangements needed? No  If their condition worsens, is the pt aware to call PCP or go to the Emergency Dept.? Yes Was the patient provided with contact information for the PCP's office or ED? Yes Was to pt encouraged to call back with questions or concerns? Yes

## 2020-10-02 NOTE — Telephone Encounter (Signed)
Dr. Jonni Sanger please clarify is pt suppose to be doing B12 Injections monthly or twice a month?

## 2020-10-02 NOTE — Telephone Encounter (Signed)
Pt called in asking about her next B12 shot. She stated that she just got out of the hospital today. I talked to her last week about a hospital follow up but she did not want to come in. Can someone give her a call?

## 2020-10-03 ENCOUNTER — Telehealth: Payer: Self-pay | Admitting: *Deleted

## 2020-10-03 DIAGNOSIS — C259 Malignant neoplasm of pancreas, unspecified: Secondary | ICD-10-CM

## 2020-10-03 NOTE — Telephone Encounter (Signed)
Spoke to pt told her Dr. Jonni Sanger said, she can continue getting them twice a month if she'd like. Given her new diagnosis, if once a month is better that is fine; we can discuss this at her next appointment if needed. Pt verbalized understanding and said she will get her B12 Injection at her appt next week. Told her okay.

## 2020-10-03 NOTE — Telephone Encounter (Signed)
Daughter and patient are concerned about the chemotherapy scheduled for this week--don't know anything about the drugs. Offered to have teaching session tomorrow or on 7/21 prior to visit at 0945. They both agree to Thursday prior to visit. Patient is afraid to take dressing off her port, so she will bring her EMLA cream with her for RN to apply. Will alert CSW of patient's high anxiety level as well as her daughter.

## 2020-10-04 ENCOUNTER — Encounter: Payer: Self-pay | Admitting: General Practice

## 2020-10-04 ENCOUNTER — Telehealth: Payer: Self-pay

## 2020-10-04 NOTE — Progress Notes (Signed)
[place] Spiritual Care Note  Attempted to reach Ms Shipp by phone. Caught daughter [name], providing caregiver support. Plan to follow up with Ms Mabe by phone again later this week.   [place], [place], [facility] Pager [phone] Voicemail [phone]

## 2020-10-04 NOTE — Telephone Encounter (Signed)
Pt called in asking about rehab. She thought that Dr Jonni Sanger mentioned rehab or somewhere that she can go

## 2020-10-04 NOTE — Telephone Encounter (Signed)
Dr. Jonni Sanger, please see message and advise.

## 2020-10-05 ENCOUNTER — Encounter: Payer: Self-pay | Admitting: Nurse Practitioner

## 2020-10-05 ENCOUNTER — Inpatient Hospital Stay: Payer: Medicare HMO

## 2020-10-05 ENCOUNTER — Encounter: Payer: Self-pay | Admitting: General Practice

## 2020-10-05 ENCOUNTER — Other Ambulatory Visit: Payer: Self-pay

## 2020-10-05 ENCOUNTER — Inpatient Hospital Stay (HOSPITAL_BASED_OUTPATIENT_CLINIC_OR_DEPARTMENT_OTHER): Payer: Medicare HMO | Admitting: Nurse Practitioner

## 2020-10-05 VITALS — BP 142/80 | HR 100 | Temp 97.6°F | Resp 18 | Wt 160.2 lb

## 2020-10-05 DIAGNOSIS — C787 Secondary malignant neoplasm of liver and intrahepatic bile duct: Secondary | ICD-10-CM | POA: Diagnosis not present

## 2020-10-05 DIAGNOSIS — I10 Essential (primary) hypertension: Secondary | ICD-10-CM | POA: Diagnosis not present

## 2020-10-05 DIAGNOSIS — Z5111 Encounter for antineoplastic chemotherapy: Secondary | ICD-10-CM | POA: Diagnosis not present

## 2020-10-05 DIAGNOSIS — C251 Malignant neoplasm of body of pancreas: Secondary | ICD-10-CM | POA: Diagnosis present

## 2020-10-05 DIAGNOSIS — Z87891 Personal history of nicotine dependence: Secondary | ICD-10-CM | POA: Diagnosis not present

## 2020-10-05 DIAGNOSIS — C259 Malignant neoplasm of pancreas, unspecified: Secondary | ICD-10-CM | POA: Diagnosis not present

## 2020-10-05 DIAGNOSIS — E119 Type 2 diabetes mellitus without complications: Secondary | ICD-10-CM | POA: Diagnosis not present

## 2020-10-05 DIAGNOSIS — C250 Malignant neoplasm of head of pancreas: Secondary | ICD-10-CM | POA: Diagnosis not present

## 2020-10-05 DIAGNOSIS — E079 Disorder of thyroid, unspecified: Secondary | ICD-10-CM | POA: Diagnosis not present

## 2020-10-05 DIAGNOSIS — M1990 Unspecified osteoarthritis, unspecified site: Secondary | ICD-10-CM | POA: Diagnosis not present

## 2020-10-05 DIAGNOSIS — E538 Deficiency of other specified B group vitamins: Secondary | ICD-10-CM | POA: Diagnosis not present

## 2020-10-05 DIAGNOSIS — E785 Hyperlipidemia, unspecified: Secondary | ICD-10-CM | POA: Diagnosis not present

## 2020-10-05 LAB — CBC WITH DIFFERENTIAL (CANCER CENTER ONLY)
Abs Immature Granulocytes: 0.03 10*3/uL (ref 0.00–0.07)
Basophils Absolute: 0.1 10*3/uL (ref 0.0–0.1)
Basophils Relative: 1 %
Eosinophils Absolute: 0.1 10*3/uL (ref 0.0–0.5)
Eosinophils Relative: 1 %
HCT: 44.5 % (ref 36.0–46.0)
Hemoglobin: 14.8 g/dL (ref 12.0–15.0)
Immature Granulocytes: 0 %
Lymphocytes Relative: 23 %
Lymphs Abs: 2.5 10*3/uL (ref 0.7–4.0)
MCH: 30.5 pg (ref 26.0–34.0)
MCHC: 33.3 g/dL (ref 30.0–36.0)
MCV: 91.8 fL (ref 80.0–100.0)
Monocytes Absolute: 0.7 10*3/uL (ref 0.1–1.0)
Monocytes Relative: 6 %
Neutro Abs: 7.7 10*3/uL (ref 1.7–7.7)
Neutrophils Relative %: 69 %
Platelet Count: 250 10*3/uL (ref 150–400)
RBC: 4.85 MIL/uL (ref 3.87–5.11)
RDW: 13.3 % (ref 11.5–15.5)
WBC Count: 11.1 10*3/uL — ABNORMAL HIGH (ref 4.0–10.5)
nRBC: 0 % (ref 0.0–0.2)

## 2020-10-05 LAB — CMP (CANCER CENTER ONLY)
ALT: 85 U/L — ABNORMAL HIGH (ref 0–44)
AST: 28 U/L (ref 15–41)
Albumin: 4.2 g/dL (ref 3.5–5.0)
Alkaline Phosphatase: 149 U/L — ABNORMAL HIGH (ref 38–126)
Anion gap: 12 (ref 5–15)
BUN: 15 mg/dL (ref 8–23)
CO2: 22 mmol/L (ref 22–32)
Calcium: 9.4 mg/dL (ref 8.9–10.3)
Chloride: 97 mmol/L — ABNORMAL LOW (ref 98–111)
Creatinine: 0.6 mg/dL (ref 0.44–1.00)
GFR, Estimated: >60 mL/min (ref >60)
Glucose, Bld: 236 mg/dL — ABNORMAL HIGH (ref 70–99)
Potassium: 4.2 mmol/L (ref 3.5–5.1)
Sodium: 131 mmol/L — ABNORMAL LOW (ref 135–145)
Total Bilirubin: 2.5 mg/dL — ABNORMAL HIGH (ref 0.3–1.2)
Total Protein: 7.4 g/dL (ref 6.5–8.1)

## 2020-10-05 NOTE — Progress Notes (Signed)
[place] OFFICE PROGRESS NOTE   Diagnosis: Pancreas cancer  INTERVAL HISTORY:   Ms. Heatwole returns for follow-up.  She feels stronger.  No nausea or vomiting.  She has been constipated.  She is taking Colace and MiraLAX.  She takes 1 Vicodin tablet for pain.  The pain medication did not help last night.  Objective:  Vital signs in last 24 hours:  Blood pressure (!) 142/80, pulse 100, temperature 97.6 F (36.4 C), temperature source Temporal, resp. rate 18, weight 160 lb 3.2 oz (72.7 kg), SpO2 99 %.    HEENT: Sclera anicteric. Resp: Lungs clear bilaterally. Cardio: Regular rate and rhythm. GI: Abdomen soft.  Tender over the side bilaterally.  No hepatomegaly.  No mass. Vascular: No leg edema. Port-A-Cath without erythema.  Lab Results:  Lab Results  Component Value Date   WBC 11.1 (H) 10/05/2020   HGB 14.8 10/05/2020   HCT 44.5 10/05/2020   MCV 91.8 10/05/2020   PLT 250 10/05/2020   NEUTROABS 7.7 10/05/2020    Imaging:  No results found.  Medications: I have reviewed the patient's current medications.  Assessment/Plan: Adenocarcinoma the pancreas body, stage IV (cT4,cN0,pM1) Ultrasound abdomen 08/18/2020-possible hypoechoic pancreas body mass, small hypoechoic liver areas MRI abdomen 08/27/2020-pancreas body mass, multiple hepatic lesions consistent with metastases, right abdominal omental nodularity, pancreas mass extends to the celiac bifurcation and abuts the splenic vein and splenoportal confluence CT chest 09/07/2020-scattered pulmonary nodules concerning for metastases, pancreas body mass, hepatic metastases Ultrasound-guided biopsy of a right liver lesion 09/08/2020-adenocarcinoma consistent with a pancreas primary CT abdomen/pelvis 09/24/2020-pancreas neck mass, omental and peritoneal nodularity-unchanged, multiple hypoenhancing liver masses-unchanged, numerous small pulmonary nodules   2.  Pain secondary #1 3.  Diabetes 4.  Hypertension 5.   Osteoarthritis 6.  Port-A-Cath placement 09/22/2020 7.  Admission with jaundice 09/24/2020.  MRI-new abrupt constriction of the common hepatic duct.  The infiltrative pancreatic mass extends medially in the porta hepatis to obstruct the common hepatic duct.  Multiple right hepatic lobe metastases mildly increased in size.  Stent placed into the common bile duct 09/28/2020.  Disposition: Ms. Shilling appears stable.  She is accompanied to today's visit by her daughter.  She is scheduled for cycle 1 gemcitabine/Abraxane today.  We reviewed her diagnosis of stage IV/metastatic pancreas cancer involving the liver, lungs, omentum/peritoneum.  They understand no therapy will be curative.  We discussed the recent hospitalization with hyperbilirubinemia due to obstruction of the common hepatic duct.  A stent has been placed.  They understand the bilirubin is better but not at an adequate level to begin treatment.  Dr. Gearldine Shown recommendation is for the bilirubin to be less than 2 prior to initiating gemcitabine/Abraxane.   As such, we are canceling treatment today and rescheduling for 1 week.  For pain she will try 2 hydrocodone tablets.  If this is not effective she will contact the office.  She will return for lab, follow-up, cycle 1 gemcitabine/Abraxane in 1 week.  We are available to see her sooner if needed.  Patient seen with Dr. Benay Spice.   Ned Card ANP/GNP-BC   10/05/2020  12:41 PM  This was a shared visit with Ned Card.  Ms. Moll has been diagnosed with metastatic pancreas cancer.  We discussed the treatment plan with Ms. Mastrianni and her daughter.  Chemotherapy will remain on hold until the bilirubin is further improved.  She will continue hydrocodone as needed for pain.  I was present for greater than 50% of today's visit.  I  performed medical decision making.  Julieanne Manson, MD

## 2020-10-05 NOTE — Telephone Encounter (Signed)
Left message on voicemail to call office.  

## 2020-10-05 NOTE — Progress Notes (Signed)
[facility] CSW Progress Notes  Referral received from medical oncologist, requesting SW team connect w patient for support - patient and daughter are both very anxious.  Called both patient and daughter, no answer, left VM w my contact information and encouragement to return my call.  Edwyna Shell, LCSW Clinical Social Worker Phone:  [phone]

## 2020-10-06 ENCOUNTER — Encounter: Payer: Self-pay | Admitting: General Practice

## 2020-10-06 LAB — CANCER ANTIGEN 19-9: CA 19-9: 17322 U/mL — ABNORMAL HIGH (ref 0–35)

## 2020-10-06 NOTE — Progress Notes (Signed)
[facility] Spiritual Care Note  Planned to follow up with [name] by phone, and then referred by Webb Silversmith Cunningham/LCSW for support with mindfulness/guided meditation. Left voicemail and will try again next week.   [place], [place], [facility] Pager [phone] Voicemail [phone]

## 2020-10-06 NOTE — Progress Notes (Signed)
[facility] CSW Progress Notes  Call to patient at request of Dionne Milo RN, reports patient and daughter are experiencing significant anxiety.  Pt states that she called RN at a time of high anxiety - in general, she is managing OK..  She has used mindfulness meditation in the past to manage anxiety - she would like in person resources for this.  CSW will check w chaplain who may be willing to meet w patient to work on these coping strategies. Also discussed impact of family member diagnosis of cancer on others in the family - and the need for all family members to have places to express their feelings.  Patient can consider [facility] which has options for family meetings to process difficult issues surrounding serious illness.  She will consider this option.  Patient would like times where she can concentrate on experiencing pleasurable activities with family and friends - going out to eat, spending time together, parties.  She is concentrating on enjoying time w others and doing things she enjoys.    Toni Shell, LCSW Clinical Social Worker Phone:  [phone]

## 2020-10-09 ENCOUNTER — Encounter: Payer: Self-pay | Admitting: General Practice

## 2020-10-09 ENCOUNTER — Encounter: Payer: Self-pay | Admitting: *Deleted

## 2020-10-09 NOTE — Progress Notes (Signed)
Patient left message with answering service that she has questions about the vitamins/ingredients in her protein drink. Forwarded her message to dietician, Ernestene Kiel.

## 2020-10-09 NOTE — Progress Notes (Signed)
[facility] Spiritual Care Note  Reached Ms Rushworth by phone (at secondary/cell number, per daughter [name]'s encouragement) at an inconvenient time. We plan to follow up by phone tomorrow morning to discuss meditation and other support resources.   [place], [place], [facility] Pager [phone] Voicemail [phone]

## 2020-10-10 ENCOUNTER — Inpatient Hospital Stay: Payer: Medicare HMO | Admitting: Dietician

## 2020-10-10 ENCOUNTER — Other Ambulatory Visit: Payer: Self-pay

## 2020-10-10 ENCOUNTER — Encounter: Payer: Self-pay | Admitting: General Practice

## 2020-10-10 NOTE — Progress Notes (Signed)
[facility] Spiritual Care Note  Reached Ms Difelice by phone as planned, providing pastoral listening, emotional support, and introduction to [facility] and [employer] support programming.  Will mail a packet of related print materials and a guided meditation CD as resources for practicing mindfulness. We also set an appointment for Tuesday, August 9 at 2pm for coaching in meditative breathing techniques. Ms Tenbarge has my direct-dial number in case needs or changes arise in the meantime.   [place], [place], [facility] Pager [phone] Voicemail [phone]

## 2020-10-11 ENCOUNTER — Other Ambulatory Visit: Payer: Medicare HMO

## 2020-10-11 ENCOUNTER — Ambulatory Visit: Payer: Medicare HMO | Admitting: Nurse Practitioner

## 2020-10-11 ENCOUNTER — Inpatient Hospital Stay: Payer: Medicare HMO

## 2020-10-11 ENCOUNTER — Other Ambulatory Visit: Payer: Self-pay

## 2020-10-11 ENCOUNTER — Inpatient Hospital Stay: Payer: Medicare HMO | Admitting: Oncology

## 2020-10-11 ENCOUNTER — Ambulatory Visit: Payer: Medicare HMO | Admitting: Dietician

## 2020-10-11 ENCOUNTER — Encounter: Payer: Self-pay | Admitting: Family Medicine

## 2020-10-11 ENCOUNTER — Ambulatory Visit: Payer: Medicare HMO

## 2020-10-11 ENCOUNTER — Ambulatory Visit (INDEPENDENT_AMBULATORY_CARE_PROVIDER_SITE_OTHER): Payer: Medicare HMO | Admitting: Family Medicine

## 2020-10-11 VITALS — BP 132/88 | HR 92 | Temp 98.2°F | Wt 160.2 lb

## 2020-10-11 DIAGNOSIS — I10 Essential (primary) hypertension: Secondary | ICD-10-CM

## 2020-10-11 DIAGNOSIS — E039 Hypothyroidism, unspecified: Secondary | ICD-10-CM | POA: Diagnosis not present

## 2020-10-11 DIAGNOSIS — E538 Deficiency of other specified B group vitamins: Secondary | ICD-10-CM | POA: Diagnosis not present

## 2020-10-11 DIAGNOSIS — E119 Type 2 diabetes mellitus without complications: Secondary | ICD-10-CM

## 2020-10-11 DIAGNOSIS — C259 Malignant neoplasm of pancreas, unspecified: Secondary | ICD-10-CM | POA: Diagnosis not present

## 2020-10-11 LAB — VITAMIN B12: Vitamin B-12: 1105 pg/mL — ABNORMAL HIGH (ref 211–911)

## 2020-10-11 LAB — TSH: TSH: 2.19 u[IU]/mL (ref 0.35–5.50)

## 2020-10-11 MED ORDER — CYANOCOBALAMIN 1000 MCG/ML IJ SOLN: 1000.0000 ug | Freq: Once | INTRAMUSCULAR | Status: DC

## 2020-10-11 MED ORDER — CYANOCOBALAMIN 1000 MCG/ML IJ SOLN
1000.0000 ug | Freq: Once | INTRAMUSCULAR | Status: AC
  Administered 2020-10-11: 1000 ug via INTRAMUSCULAR

## 2020-10-11 NOTE — Progress Notes (Signed)
Subjective  CC:  Chief Complaint  Patient presents with   Hospitalization Follow-up    Per patient her urine became brown and oncologist sent her to ER.     HPI: [name] is a 75 y.o. female who presents to the office today to address the problems listed above in the chief complaint. 75 year old with metastatic pancreatic cancer here for follow-up.  Was hospitalized less than 2 weeks ago for biliary obstruction.  I reviewed discharge summary in detail.  Ultimately, stenting was done in her bilirubin decreased.  Her symptoms decreased.  She is now home and has follow-up with oncology.  I reviewed the recent note.  They are to start chemotherapy soon.  Lab work continues to trend downward.  She has no new symptoms. Diet-controlled diabetes: She has noted that her sugars are more elevated now.  She has a difficulty eating due to abdominal pain.  She finds that the softer foods are easier.  Unfortunately she is eating more starches than usual.  Sugars are as high as 200.  Lab Results  Component Value Date   HGBA1C 7.1 (H) 09/25/2020   HGBA1C 7.1 (A) 07/26/2020   HGBA1C 6.3 (A) 01/27/2020    Vitamin B12 deficiency: She still like to get B12 injections bimonthly.  This is her preference.  She is due for B12 level checked. Hypothyroidism: Takes levothyroxine.  She is due for a TSH.  She admits to fatigue. Hypertension: She takes 5 mg of amlodipine daily.  She denies orthostatic symptoms.  No chest pain or shortness of breath.  Assessment  1. Primary pancreatic cancer with metastasis to other site [facility])   2. Pancreatic adenocarcinoma ([place])   3. Diet-controlled diabetes mellitus ([place])   4. Vitamin B12 deficiency   5. Acquired hypothyroidism   6. Essential hypertension      Plan  Pancreatic cancer with metastasis: Per oncology Biliary obstruction status post stenting.  Resolving Hypothyroidism: Recheck TSH today.  May need dose adjustment. Diabetes: Control is worsening.  In part  due to stress of illness and change in diet.  Patient will monitor her diet, we will monitor her appetite and recheck in 3 months.  No additional medicines indicated at this time. Vitamin B12 deficiency: We will continue B12 injections per patient request.  Check B12 levels today. Hypertension: Stable control on amlodipine 5 mg daily.  Follow up: 3 months to recheck diabetes 10/26/2020  Orders Placed This Encounter  Procedures   TSH   Vitamin B12   Meds ordered this encounter  Medications   DISCONTD: cyanocobalamin ((VITAMIN B-12)) injection 1,000 mcg   cyanocobalamin ((VITAMIN B-12)) injection 1,000 mcg      I reviewed the patients updated PMH, FH, and SocHx.    Patient Active Problem List   Diagnosis Date Noted   GAD (generalized anxiety disorder) 10/20/2019    Priority: High   Major depression, recurrent, chronic ([place]) 10/20/2019    Priority: High   Diet-controlled diabetes mellitus ([place]) 10/20/2019    Priority: High   Essential tremor 04/13/2019    Priority: High   Acquired hypothyroidism 05/24/2012    Priority: High   Alcohol abuse, daily use 05/24/2012    Priority: High   Essential hypertension     Priority: High   Mixed hyperlipidemia     Priority: High   Osteoarthritis, multiple sites 10/20/2019    Priority: Medium   Bilateral primary osteoarthritis of knee 09/23/2017    Priority: Medium   Vitamin B12 deficiency 10/20/2019  Priority: Low   Presbycusis of both ears 04/13/2019    Priority: Low   Pancreatic adenocarcinoma ([place]) 09/25/2020   Jaundice, perinatal, from hepatocellular damage 09/24/2020   Elevated LFTs 09/24/2020   Dark urine 09/24/2020   Dehydration 09/24/2020   Hyperbilirubinemia 09/24/2020   Primary pancreatic cancer with metastasis to other site [facility]) 09/12/2020   Alcohol use, 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[place]) 08/18/2020   Current Meds  Medication Sig   ACCU-CHEK AVIVA PLUS test strip    Accu-Chek Softclix  Lancets lancets    ALPRAZolam (XANAX) 0.5 MG tablet Take 1 tablet (0.5 mg total) by mouth daily as needed for anxiety.   amLODipine (NORVASC) 5 MG tablet Take 1 tablet (5 mg total) by mouth daily.   aspirin EC 81 MG tablet Take 81 mg by mouth daily.   Blood Glucose Monitoring Suppl (ACCU-CHEK GUIDE) w/Device KIT    cyanocobalamin (,VITAMIN B-12,) 1000 MCG/ML injection INJECT 1 ML EVERY 2 WEEKS   docusate sodium (COLACE) 100 MG capsule Take 100 mg by mouth 2 (two) times daily.   levothyroxine (SYNTHROID) 125 MCG tablet TAKE 1 TABLET ON AN EMPTY STOMACH IN THE MORNING   lidocaine-prilocaine (EMLA) cream Apply 1 application topically as needed.   omeprazole (PRILOSEC) 20 MG capsule Take 1 capsule (20 mg total) by mouth daily.   ondansetron (ZOFRAN) 8 MG tablet Take 1 tablet (8 mg total) by mouth 2 (two) times daily as needed (Nausea or vomiting).   Polyethylene Glycol 3350 (MIRALAX PO) Take by mouth.   prochlorperazine (COMPAZINE) 10 MG tablet Take 1 tablet (10 mg total) by mouth every 6 (six) hours as needed (Nausea or vomiting).    Allergies: Patient is allergic to penicillins. Family History: Patient family history includes Alcohol abuse in her brother; Alzheimer's disease in her mother; COPD in her father; Cancer in an other family member; Dementia in her mother; Diabetes in her mother; Emphysema in her brother and father; Heart attack in her daughter; Heart disease in her brother and sister. Social History:  Patient  reports that she quit smoking about 33 years ago. Her smoking use included cigarettes. She has a 10.00 pack-year smoking history. She has never used smokeless tobacco. She reports current alcohol use of about 21.0 standard drinks of alcohol per week. She reports that she does not use drugs.  Review of Systems: Constitutional: Negative for fever malaise or anorexia Cardiovascular: negative for chest pain Respiratory: negative for SOB or persistent cough Gastrointestinal:  negative for abdominal pain  Objective  Vitals: BP 132/88   Pulse 92   Temp 98.2 F (36.8 C) (Temporal)   Wt 160 lb 3.2 oz (72.7 kg)   SpO2 98%   BMI 26.66 kg/m  General: no acute distress , A&Ox3 HEENT: PEERL, conjunctiva normal, neck is supple Cardiovascular:  RRR without murmur or gallop.  Respiratory:  Good breath sounds bilaterally, CTAB with normal respiratory effort     Commons side effects, risks, benefits, and alternatives for medications and treatment plan prescribed today were discussed, and the patient expressed understanding of the given instructions. Patient is instructed to call or message via MyChart if he/she has any questions or concerns regarding our treatment plan. No barriers to understanding were identified. We discussed Red Flag symptoms and signs in detail. Patient expressed understanding regarding what to do in case of urgent or emergency type symptoms.  Medication list was reconciled, printed and provided to the patient in AVS. Patient instructions and summary information was reviewed with the patient  as documented in the AVS. This note was prepared with assistance of Dragon voice recognition software. Occasional wrong-word or sound-a-like substitutions may have occurred due to the inherent limitations of voice recognition software  This visit occurred during the SARS-CoV-2 public health emergency.  Safety protocols were in place, including screening questions prior to the visit, additional usage of staff PPE, and extensive cleaning of exam room while observing appropriate contact time as indicated for disinfecting solutions.

## 2020-10-11 NOTE — Patient Instructions (Addendum)
Please return in 3 months for diabetes follow up   If your diabetic control worsens, we will consider starting medication. I will let you know how your thyroid level is and if we need to adjust the dose of your medication.  If you have any questions or concerns, please don't hesitate to send me a message via MyChart or call the office at [phone]. Thank you for visiting with [place] today! It's our pleasure caring for you.

## 2020-10-11 NOTE — Progress Notes (Signed)
Please call patient: I have reviewed his/her lab results. Thyroid function is good. Continue same dose of levothyroxine. B12 levels are too high. Please skip next injection and give next injection in 4 weeks instead of 2. Thanks

## 2020-10-11 NOTE — Telephone Encounter (Signed)
Spoke to pt's daughter [name], told her calling about pt's had a questions about that Dr.Andy mentioned something about Rehab or some place she can go? Told her Dr. Jonni Sanger is not sure what she is talking about. [name] said she does not know can reach pt on her mobile. Told her okay will try her mobile.  Tried to call pt on mobile and each time was answered and hung up right away. Pt has upcoming appt can discuss then.

## 2020-10-11 NOTE — Progress Notes (Signed)
Nutrition Follow-up:  Patient with pancreatic cancer metastatic to liver, peritoneal, lung. Cycle 1 gemcitabine/abraxane scheduled 7/21 rescheduled due to hyperbilirubinemia. Noted Hospital admission 7/10-7/15 for juandice. She is s/p stent on 7/14. Plans to receive first chemotherapy treatment 7/28.   Spoke with patient via telephone, reports she went out to eat at [employer] for dinner last night. Patient enjoyed seeing family that she had not seen in a while. Says she had a few bites of a burger and a few fries. She does not care much for fries anymore. This morning, she had 3 fried eggs, piece of toast, grits with little of butter, glass of fairlife milk. Eating lots of eggs with cheese, chicken, oatmeal, grits, rice, 2-3 glasses of fairlife milk and couple bottles of water. Patient reports regular bowel movement this morning, constipation has resolved. She has stopped taking Colace and Miralax.   Medications: reviewed  Labs: 7/21 Na 131, Glucose 236  Anthropometrics: Weight 160 lb 3.2 oz on 7/21 decreased from 166 lb 6.4 oz on 6/28 and 170 lb 9.6 oz on 6/9.     NUTRITION DIAGNOSIS: Unintentional weight loss ongoing   INTERVENTION:  Educated on strategies for better glucose control (not skipping meals, balanced meals/snacks with high protein foods), will mail handout Continue drinking Fairlife milk, suggested switching to whole milk for added calories Recommended Ensure Plus/equivalent daily for added calories and protein, will mail coupons Would favor adjusting DM medications verses drinking glucerna as this offers less calories than Ensure/Boost supplements Patient has contact information  MONITORING, EVALUATION, GOAL: weight trends, intake   NEXT VISIT: Tuesday, August 23 via telephone

## 2020-10-11 NOTE — Progress Notes (Signed)
Pharmacist Chemotherapy Monitoring - Initial Assessment    Anticipated start date: 10/12/20   The following has been reviewed per standard work regarding the patient's treatment regimen: The patient's diagnosis, treatment plan and drug doses, and organ/hematologic function Lab orders and baseline tests specific to treatment regimen  The treatment plan start date, drug sequencing, and pre-medications Prior authorization status  Patient's documented medication list, including drug-drug interaction screen and prescriptions for anti-emetics and supportive care specific to the treatment regimen The drug concentrations, fluid compatibility, administration routes, and timing of the medications to be used The patient's access for treatment and lifetime cumulative dose history, if applicable  The patient's medication allergies and previous infusion related reactions, if applicable   Changes made to treatment plan:  N/A  Follow up needed:  N/A   Patrica Duel, RPH, 10/11/2020  4:21 PM

## 2020-10-12 ENCOUNTER — Inpatient Hospital Stay: Payer: Medicare HMO

## 2020-10-12 ENCOUNTER — Inpatient Hospital Stay: Payer: Medicare HMO | Admitting: Oncology

## 2020-10-12 VITALS — BP 128/81 | HR 114 | Temp 98.2°F | Resp 18 | Ht 65.0 in | Wt 160.4 lb

## 2020-10-12 VITALS — HR 110

## 2020-10-12 DIAGNOSIS — Z5111 Encounter for antineoplastic chemotherapy: Secondary | ICD-10-CM | POA: Diagnosis not present

## 2020-10-12 DIAGNOSIS — E785 Hyperlipidemia, unspecified: Secondary | ICD-10-CM | POA: Diagnosis not present

## 2020-10-12 DIAGNOSIS — C259 Malignant neoplasm of pancreas, unspecified: Secondary | ICD-10-CM | POA: Diagnosis not present

## 2020-10-12 DIAGNOSIS — M1990 Unspecified osteoarthritis, unspecified site: Secondary | ICD-10-CM | POA: Diagnosis not present

## 2020-10-12 DIAGNOSIS — E119 Type 2 diabetes mellitus without complications: Secondary | ICD-10-CM | POA: Diagnosis not present

## 2020-10-12 DIAGNOSIS — I10 Essential (primary) hypertension: Secondary | ICD-10-CM | POA: Diagnosis not present

## 2020-10-12 DIAGNOSIS — E538 Deficiency of other specified B group vitamins: Secondary | ICD-10-CM | POA: Diagnosis not present

## 2020-10-12 DIAGNOSIS — E079 Disorder of thyroid, unspecified: Secondary | ICD-10-CM | POA: Diagnosis not present

## 2020-10-12 DIAGNOSIS — C250 Malignant neoplasm of head of pancreas: Secondary | ICD-10-CM | POA: Diagnosis not present

## 2020-10-12 DIAGNOSIS — C787 Secondary malignant neoplasm of liver and intrahepatic bile duct: Secondary | ICD-10-CM | POA: Diagnosis not present

## 2020-10-12 LAB — CBC WITH DIFFERENTIAL (CANCER CENTER ONLY)
Abs Immature Granulocytes: 0.03 10*3/uL (ref 0.00–0.07)
Basophils Absolute: 0.1 10*3/uL (ref 0.0–0.1)
Basophils Relative: 1 %
Eosinophils Absolute: 0.1 10*3/uL (ref 0.0–0.5)
Eosinophils Relative: 1 %
HCT: 41 % (ref 36.0–46.0)
Hemoglobin: 14 g/dL (ref 12.0–15.0)
Immature Granulocytes: 0 %
Lymphocytes Relative: 21 %
Lymphs Abs: 2 10*3/uL (ref 0.7–4.0)
MCH: 30.6 pg (ref 26.0–34.0)
MCHC: 34.1 g/dL (ref 30.0–36.0)
MCV: 89.5 fL (ref 80.0–100.0)
Monocytes Absolute: 0.6 10*3/uL (ref 0.1–1.0)
Monocytes Relative: 7 %
Neutro Abs: 6.7 10*3/uL (ref 1.7–7.7)
Neutrophils Relative %: 70 %
Platelet Count: 210 10*3/uL (ref 150–400)
RBC: 4.58 MIL/uL (ref 3.87–5.11)
RDW: 12.9 % (ref 11.5–15.5)
WBC Count: 9.5 10*3/uL (ref 4.0–10.5)
nRBC: 0 % (ref 0.0–0.2)

## 2020-10-12 LAB — CMP (CANCER CENTER ONLY)
ALT: 29 U/L (ref 0–44)
AST: 16 U/L (ref 15–41)
Albumin: 3.9 g/dL (ref 3.5–5.0)
Alkaline Phosphatase: 109 U/L (ref 38–126)
Anion gap: 11 (ref 5–15)
BUN: 11 mg/dL (ref 8–23)
CO2: 19 mmol/L — ABNORMAL LOW (ref 22–32)
Calcium: 9.1 mg/dL (ref 8.9–10.3)
Chloride: 98 mmol/L (ref 98–111)
Creatinine: 0.59 mg/dL (ref 0.44–1.00)
GFR, Estimated: >60 mL/min (ref >60)
Glucose, Bld: 256 mg/dL — ABNORMAL HIGH (ref 70–99)
Potassium: 4.1 mmol/L (ref 3.5–5.1)
Sodium: 128 mmol/L — ABNORMAL LOW (ref 135–145)
Total Bilirubin: 1.7 mg/dL — ABNORMAL HIGH (ref 0.3–1.2)
Total Protein: 6.6 g/dL (ref 6.5–8.1)

## 2020-10-12 MED ORDER — PACLITAXEL PROTEIN-BOUND CHEMO INJECTION 100 MG
100.0000 mg/m2 | Freq: Once | INTRAVENOUS | Status: AC
  Administered 2020-10-12: 175 mg via INTRAVENOUS
  Filled 2020-10-12: qty 35

## 2020-10-12 MED ORDER — HYDROCODONE-ACETAMINOPHEN 5-325 MG PO TABS: 1.0000 | ORAL_TABLET | Freq: Four times a day (QID) | ORAL | 0 refills | Status: DC | PRN

## 2020-10-12 MED ORDER — HEPARIN SOD (PORK) LOCK FLUSH 100 UNIT/ML IV SOLN
500.0000 [IU] | Freq: Once | INTRAVENOUS | Status: AC | PRN
  Administered 2020-10-12: 500 [IU]
  Filled 2020-10-12: qty 5

## 2020-10-12 MED ORDER — SODIUM CHLORIDE 0.9% FLUSH
10.0000 mL | INTRAVENOUS | Status: DC | PRN
  Administered 2020-10-12: 10 mL
  Filled 2020-10-12: qty 10

## 2020-10-12 MED ORDER — PROCHLORPERAZINE MALEATE 10 MG PO TABS
10.0000 mg | ORAL_TABLET | Freq: Once | ORAL | Status: AC
  Administered 2020-10-12: 10 mg via ORAL
  Filled 2020-10-12: qty 1

## 2020-10-12 MED ORDER — GEMCITABINE HCL CHEMO INJECTION 1 GM/26.3ML
800.0000 mg/m2 | Freq: Once | INTRAVENOUS | Status: AC
  Administered 2020-10-12: 1482 mg via INTRAVENOUS
  Filled 2020-10-12: qty 26.3

## 2020-10-12 MED ORDER — SODIUM CHLORIDE 0.9 % IV SOLN
Freq: Once | INTRAVENOUS | Status: AC
  Filled 2020-10-12: qty 250

## 2020-10-12 NOTE — Patient Instructions (Signed)
[name]  Discharge Instructions: Thank you for choosing [facility] to provide your oncology and hematology care.   If you have a lab appointment with the [facility], please go directly to the [facility] and check in at the registration area.   Wear comfortable clothing and clothing appropriate for easy access to any Portacath or PICC line.   We strive to give you quality time with your provider. You may need to reschedule your appointment if you arrive late (15 or more minutes).  Arriving late affects you and other patients whose appointments are after yours.  Also, if you miss three or more appointments without notifying the office, you may be dismissed from the clinic at the provider's discretion.      For prescription refill requests, have your pharmacy contact our office and allow 72 hours for refills to be completed.    Today you received the following chemotherapy and/or immunotherapy agents paclitaxel protein bound, gemcitabine    To help prevent nausea and vomiting after your treatment, we encourage you to take your nausea medication as directed.  BELOW ARE SYMPTOMS THAT SHOULD BE REPORTED IMMEDIATELY: *FEVER GREATER THAN 100.4 F (38 C) OR HIGHER *CHILLS OR SWEATING *NAUSEA AND VOMITING THAT IS NOT CONTROLLED WITH YOUR NAUSEA MEDICATION *UNUSUAL SHORTNESS OF BREATH *UNUSUAL BRUISING OR BLEEDING *URINARY PROBLEMS (pain or burning when urinating, or frequent urination) *BOWEL PROBLEMS (unusual diarrhea, constipation, pain near the anus) TENDERNESS IN MOUTH AND THROAT WITH OR WITHOUT PRESENCE OF ULCERS (sore throat, sores in mouth, or a toothache) UNUSUAL RASH, SWELLING OR PAIN  UNUSUAL VAGINAL DISCHARGE OR ITCHING   Items with * indicate a potential emergency and should be followed up as soon as possible or go to the Emergency Department if any problems should occur.  Please show the CHEMOTHERAPY ALERT CARD or IMMUNOTHERAPY  ALERT CARD at check-in to the Emergency Department and triage nurse.  Should you have questions after your visit or need to cancel or reschedule your appointment, please contact [facility]  Dept: [phone]  and follow the prompts.  Office hours are 8:00 a.m. to 4:30 p.m. Monday - Friday. Please note that voicemails left after 4:00 p.m. may not be returned until the following business day.  We are closed weekends and major holidays. You have access to a nurse at all times for urgent questions. Please call the main number to the clinic Dept: [phone] and follow the prompts.   For any non-urgent questions, you may also contact your provider using MyChart. We now offer e-Visits for anyone 53 and older to request care online for non-urgent symptoms. For details visit [email or web address].   Also download the MyChart app! Go to the app store, search "MyChart", open the app, select [facility], and log in with your MyChart username and password.  Due to Covid, a mask is required upon entering the hospital/clinic. If you do not have a mask, one will be given to you upon arrival. For doctor visits, patients may have 1 support person aged 31 or older with them. For treatment visits, patients cannot have anyone with them due to current Covid guidelines and our immunocompromised population.   Nanoparticle Albumin-Bound Paclitaxel injection What is this medication? NANOPARTICLE ALBUMIN-BOUND PACLITAXEL (Na no PAHR ti kuhl al BYOO muhn-bound PAK li TAX el) is a chemotherapy drug. It targets fast dividing cells, like cancer cells, and causes these cells to die. This medicine is used to treatadvanced breast  cancer, lung cancer, and pancreatic cancer. This medicine may be used for other purposes; ask your health care provider orpharmacist if you have questions. COMMON BRAND NAME(S): Abraxane What should I tell my care team before I take this medication? They need to know if you  have any of these conditions: kidney disease liver disease low blood counts, like low white cell, platelet, or red cell counts lung or breathing disease, like asthma tingling of the fingers or toes, or other nerve disorder an unusual or allergic reaction to paclitaxel, albumin, other chemotherapy, other medicines, foods, dyes, or preservatives pregnant or trying to get pregnant breast-feeding How should I use this medication? This drug is given as an infusion into a vein. It is administered in a hospitalor clinic by a specially trained health care professional. Talk to your pediatrician regarding the use of this medicine in children.Special care may be needed. Overdosage: If you think you have taken too much of this medicine contact apoison control center or emergency room at once. NOTE: This medicine is only for you. Do not share this medicine with others. What if I miss a dose? It is important not to miss your dose. Call your doctor or health careprofessional if you are unable to keep an appointment. What may interact with this medication? This medicine may interact with the following medications: antiviral medicines for hepatitis, HIV or AIDS certain antibiotics like erythromycin and clarithromycin certain medicines for fungal infections like ketoconazole and itraconazole certain medicines for seizures like carbamazepine, phenobarbital, phenytoin gemfibrozil nefazodone rifampin St. John's wort This list may not describe all possible interactions. Give your health care provider a list of all the medicines, herbs, non-prescription drugs, or dietary supplements you use. Also tell them if you smoke, drink alcohol, or use illegaldrugs. Some items may interact with your medicine. What should I watch for while using this medication? Your condition will be monitored carefully while you are receiving this medicine. You will need important blood work done while you are taking thismedicine. This  medicine can cause serious allergic reactions. If you experience allergic reactions like skin rash, itching or hives, swelling of the face, lips, ortongue, tell your doctor or health care professional right away. In some cases, you may be given additional medicines to help with side effects.Follow all directions for their use. This drug may make you feel generally unwell. This is not uncommon, as chemotherapy can affect healthy cells as well as cancer cells. Report any side effects. Continue your course of treatment even though you feel ill unless yourdoctor tells you to stop. Call your doctor or health care professional for advice if you get a fever, chills or sore throat, or other symptoms of a cold or flu. Do not treat yourself. This drug decreases your body's ability to fight infections. Try toavoid being around people who are sick. This medicine may increase your risk to bruise or bleed. Call your doctor orhealth care professional if you notice any unusual bleeding. Be careful brushing and flossing your teeth or using a toothpick because you may get an infection or bleed more easily. If you have any dental work done,tell your dentist you are receiving this medicine. Avoid taking products that contain aspirin, acetaminophen, ibuprofen, naproxen, or ketoprofen unless instructed by your doctor. These medicines may hide afever. Do not become pregnant while taking this medicine or for 6 months after stopping it. Women should inform their doctor if they wish to become pregnant or think they might be pregnant. Men should not  father a child while taking this medicine or for 3 months after stopping it. There is a potential for serious side effects to an unborn child. Talk to your health care professionalor pharmacist for more information. Do not breast-feed an infant while taking this medicine or for 2 weeks afterstopping it. This medicine may interfere with the ability to get pregnant or to father a child. You  should talk to your doctor or health care professional if you areconcerned about your fertility. What side effects may I notice from receiving this medication? Side effects that you should report to your doctor or health care professionalas soon as possible: allergic reactions like skin rash, itching or hives, swelling of the face, lips, or tongue breathing problems changes in vision fast, irregular heartbeat low blood pressure mouth sores pain, tingling, numbness in the hands or feet signs of decreased platelets or bleeding - bruising, pinpoint red spots on the skin, black, tarry stools, blood in the urine signs of decreased red blood cells - unusually weak or tired, feeling faint or lightheaded, falls signs of infection - fever or chills, cough, sore throat, pain or difficulty passing urine signs and symptoms of liver injury like dark yellow or brown urine; general ill feeling or flu-like symptoms; light-colored stools; loss of appetite; nausea; right upper belly pain; unusually weak or tired; yellowing of the eyes or skin swelling of the ankles, feet, hands unusually slow heartbeat Side effects that usually do not require medical attention (report to yourdoctor or health care professional if they continue or are bothersome): diarrhea hair loss loss of appetite nausea, vomiting tiredness This list may not describe all possible side effects. Call your doctor for medical advice about side effects. You may report side effects to FDA at1-800-FDA-1088. Where should I keep my medication? This drug is given in a hospital or clinic and will not be stored at home. NOTE: This sheet is a summary. It may not cover all possible information. If you have questions about this medicine, talk to your doctor, pharmacist, orhealth care provider.  2022 Elsevier/Gold Standard (2016-11-05 13:03:45)  Gemcitabine injection What is this medication? GEMCITABINE (jem SYE ta been) is a chemotherapy drug. This  medicine is used to treat many types of cancer like breast cancer, lung cancer, pancreatic cancer,and ovarian cancer. This medicine may be used for other purposes; ask your health care provider orpharmacist if you have questions. COMMON BRAND NAME(S): Gemzar, Infugem What should I tell my care team before I take this medication? They need to know if you have any of these conditions: blood disorders infection kidney disease liver disease lung or breathing disease, like asthma recent or ongoing radiation therapy an unusual or allergic reaction to gemcitabine, other chemotherapy, other medicines, foods, dyes, or preservatives pregnant or trying to get pregnant breast-feeding How should I use this medication? This drug is given as an infusion into a vein. It is administered in a hospitalor clinic by a specially trained health care professional. Talk to your pediatrician regarding the use of this medicine in children.Special care may be needed. Overdosage: If you think you have taken too much of this medicine contact apoison control center or emergency room at once. NOTE: This medicine is only for you. Do not share this medicine with others. What if I miss a dose? It is important not to miss your dose. Call your doctor or health careprofessional if you are unable to keep an appointment. What may interact with this medication? medicines to increase blood counts like  filgrastim, pegfilgrastim, sargramostim some other chemotherapy drugs like cisplatin vaccines Talk to your doctor or health care professional before taking any of thesemedicines: acetaminophen aspirin ibuprofen ketoprofen naproxen This list may not describe all possible interactions. Give your health care provider a list of all the medicines, herbs, non-prescription drugs, or dietary supplements you use. Also tell them if you smoke, drink alcohol, or use illegaldrugs. Some items may interact with your medicine. What should I  watch for while using this medication? Visit your doctor for checks on your progress. This drug may make you feel generally unwell. This is not uncommon, as chemotherapy can affect healthy cells as well as cancer cells. Report any side effects. Continue your course oftreatment even though you feel ill unless your doctor tells you to stop. In some cases, you may be given additional medicines to help with side effects.Follow all directions for their use. Call your doctor or health care professional for advice if you get a fever, chills or sore throat, or other symptoms of a cold or flu. Do not treat yourself. This drug decreases your body's ability to fight infections. Try toavoid being around people who are sick. This medicine may increase your risk to bruise or bleed. Call your doctor orhealth care professional if you notice any unusual bleeding. Be careful brushing and flossing your teeth or using a toothpick because you may get an infection or bleed more easily. If you have any dental work done,tell your dentist you are receiving this medicine. Avoid taking products that contain aspirin, acetaminophen, ibuprofen, naproxen, or ketoprofen unless instructed by your doctor. These medicines may hide afever. Do not become pregnant while taking this medicine or for 6 months after stopping it. Women should inform their doctor if they wish to become pregnant or think they might be pregnant. Men should not father a child while taking this medicine and for 3 months after stopping it. There is a potential for serious side effects to an unborn child. Talk to your health care professional or pharmacist for more information. Do not breast-feed an infant while takingthis medicine or for at least 1 week after stopping it. Men should inform their doctors if they wish to father a child. This medicine may lower sperm counts. Talk with your doctor or health care professional ifyou are concerned about your fertility. What side  effects may I notice from receiving this medication? Side effects that you should report to your doctor or health care professionalas soon as possible: allergic reactions like skin rash, itching or hives, swelling of the face, lips, or tongue breathing problems pain, redness, or irritation at site where injected signs and symptoms of a dangerous change in heartbeat or heart rhythm like chest pain; dizziness; fast or irregular heartbeat; palpitations; feeling faint or lightheaded, falls; breathing problems signs of decreased platelets or bleeding - bruising, pinpoint red spots on the skin, black, tarry stools, blood in the urine signs of decreased red blood cells - unusually weak or tired, feeling faint or lightheaded, falls signs of infection - fever or chills, cough, sore throat, pain or difficulty passing urine signs and symptoms of kidney injury like trouble passing urine or change in the amount of urine signs and symptoms of liver injury like dark yellow or brown urine; general ill feeling or flu-like symptoms; light-colored stools; loss of appetite; nausea; right upper belly pain; unusually weak or tired; yellowing of the eyes or skin swelling of ankles, feet, hands Side effects that usually do not require medical attention (  report to yourdoctor or health care professional if they continue or are bothersome): constipation diarrhea hair loss loss of appetite nausea rash vomiting This list may not describe all possible side effects. Call your doctor for medical advice about side effects. You may report side effects to FDA at1-800-FDA-1088. Where should I keep my medication? This drug is given in a hospital or clinic and will not be stored at home. NOTE: This sheet is a summary. It may not cover all possible information. If you have questions about this medicine, talk to your doctor, pharmacist, orhealth care provider.  2022 Elsevier/Gold Standard (2017-05-28 18:06:11)

## 2020-10-12 NOTE — Progress Notes (Signed)
Per Dr. Benay Spice: OK to treat today w/pulse 110

## 2020-10-12 NOTE — Progress Notes (Signed)
  [facility] OFFICE PROGRESS NOTE   Diagnosis: Pancreas cancer  INTERVAL HISTORY:   [name] returns as scheduled.  She takes hydrocodone once daily for relief of subcostal and back pain.  No other complaint.  Objective:  Vital signs in last 24 hours:  Blood pressure 128/81, pulse (!) 114, temperature 98.2 F (36.8 C), temperature source Oral, resp. rate 18, height '5\' 5"'$  (1.651 m), weight 160 lb 6.4 oz (72.8 kg), SpO2 100 %.    HEENT: Sclera anicteric Resp: Lungs clear bilaterally Cardio: Regular rate and rhythm GI: No mass, nontender, no hepatosplenomegaly Vascular: No leg edema   Portacath/PICC-without erythema  Lab Results:  Lab Results  Component Value Date   WBC 9.5 10/12/2020   HGB 14.0 10/12/2020   HCT 41.0 10/12/2020   MCV 89.5 10/12/2020   PLT 210 10/12/2020   NEUTROABS 6.7 10/12/2020    CMP  Lab Results  Component Value Date   NA 128 (L) 10/12/2020   K 4.1 10/12/2020   CL 98 10/12/2020   CO2 19 (L) 10/12/2020   GLUCOSE 256 (H) 10/12/2020   BUN 11 10/12/2020   CREATININE 0.59 10/12/2020   CALCIUM 9.1 10/12/2020   PROT 6.6 10/12/2020   ALBUMIN 3.9 10/12/2020   AST 16 10/12/2020   ALT 29 10/12/2020   ALKPHOS 109 10/12/2020   BILITOT 1.7 (H) 10/12/2020   GFRNONAA >60 10/12/2020   GFRAA 71 02/02/2019    Lab Results  Component Value Date   [id number] 17,322 (H) 10/05/2020     Medications: I have reviewed the patient's current medications.   Assessment/Plan: Adenocarcinoma the pancreas body, stage IV (cT4,cN0,pM1) Ultrasound abdomen 08/18/2020-possible hypoechoic pancreas body mass, small hypoechoic liver areas MRI abdomen 08/27/2020-pancreas body mass, multiple hepatic lesions consistent with metastases, right abdominal omental nodularity, pancreas mass extends to the celiac bifurcation and abuts the splenic vein and splenoportal confluence CT chest 09/07/2020-scattered pulmonary nodules concerning for metastases, pancreas body  mass, hepatic metastases Ultrasound-guided biopsy of a right liver lesion 09/08/2020-adenocarcinoma consistent with a pancreas primary CT abdomen/pelvis 09/24/2020-pancreas neck mass, omental and peritoneal nodularity-unchanged, multiple hypoenhancing liver masses-unchanged, numerous small pulmonary nodules Cycle 1 gemcitabine/Abraxane 10/04/2020   2.  Pain secondary #1 3.  Diabetes 4.  Hypertension 5.  Osteoarthritis 6.  Port-A-Cath placement 09/22/2020 7.  Admission with jaundice 09/24/2020.  MRI-new abrupt constriction of the common hepatic duct.  The infiltrative pancreatic mass extends medially in the porta hepatis to obstruct the common hepatic duct.  Multiple right hepatic lobe metastases mildly increased in size.  Stent placed into the common bile duct 09/28/2020.   Disposition: Ms. Swanigan appears stable.  The bilirubin is lower.  She will begin gemcitabine/Abraxane today.  I refilled her prescription for hydrocodone.  Ms. Wears will return for an office visit and chemotherapy in 2 weeks.  Betsy Coder, MD  10/12/2020  10:26 AM

## 2020-10-13 ENCOUNTER — Telehealth: Payer: Self-pay | Admitting: Emergency Medicine

## 2020-10-13 NOTE — Telephone Encounter (Signed)
[facility] Call to patient for 1st time Gemzar and Abraxane. Was unable to reach patient, left voicemail message. Did talk to her daughter who reported her mother having fatigue and some diarrhea.

## 2020-10-14 ENCOUNTER — Encounter (HOSPITAL_COMMUNITY): Payer: Self-pay | Admitting: Emergency Medicine

## 2020-10-14 ENCOUNTER — Emergency Department (HOSPITAL_COMMUNITY)
Admission: EM | Admit: 2020-10-14 | Discharge: 2020-10-14 | Disposition: A | Discharge status: Home / Self Care | Payer: Medicare HMO | Attending: Emergency Medicine | Admitting: Emergency Medicine

## 2020-10-14 ENCOUNTER — Other Ambulatory Visit: Payer: Self-pay

## 2020-10-14 ENCOUNTER — Emergency Department (HOSPITAL_COMMUNITY): Payer: Medicare HMO

## 2020-10-14 DIAGNOSIS — Z20822 Contact with and (suspected) exposure to covid-19: Secondary | ICD-10-CM | POA: Diagnosis not present

## 2020-10-14 DIAGNOSIS — E039 Hypothyroidism, unspecified: Secondary | ICD-10-CM | POA: Diagnosis not present

## 2020-10-14 DIAGNOSIS — Z79899 Other long term (current) drug therapy: Secondary | ICD-10-CM | POA: Insufficient documentation

## 2020-10-14 DIAGNOSIS — Z8507 Personal history of malignant neoplasm of pancreas: Secondary | ICD-10-CM | POA: Insufficient documentation

## 2020-10-14 DIAGNOSIS — I10 Essential (primary) hypertension: Secondary | ICD-10-CM | POA: Insufficient documentation

## 2020-10-14 DIAGNOSIS — A419 Sepsis, unspecified organism: Secondary | ICD-10-CM | POA: Insufficient documentation

## 2020-10-14 DIAGNOSIS — E119 Type 2 diabetes mellitus without complications: Secondary | ICD-10-CM | POA: Insufficient documentation

## 2020-10-14 DIAGNOSIS — C259 Malignant neoplasm of pancreas, unspecified: Secondary | ICD-10-CM

## 2020-10-14 DIAGNOSIS — Z7982 Long term (current) use of aspirin: Secondary | ICD-10-CM | POA: Diagnosis not present

## 2020-10-14 DIAGNOSIS — Z452 Encounter for adjustment and management of vascular access device: Secondary | ICD-10-CM | POA: Diagnosis not present

## 2020-10-14 DIAGNOSIS — R197 Diarrhea, unspecified: Secondary | ICD-10-CM | POA: Diagnosis not present

## 2020-10-14 DIAGNOSIS — Z87891 Personal history of nicotine dependence: Secondary | ICD-10-CM | POA: Diagnosis not present

## 2020-10-14 DIAGNOSIS — R531 Weakness: Secondary | ICD-10-CM | POA: Diagnosis present

## 2020-10-14 DIAGNOSIS — R000 Tachycardia, unspecified: Secondary | ICD-10-CM | POA: Diagnosis not present

## 2020-10-14 LAB — URINALYSIS, ROUTINE W REFLEX MICROSCOPIC
Bilirubin Urine: NEGATIVE
Glucose, UA: NEGATIVE mg/dL
Hgb urine dipstick: NEGATIVE
Ketones, ur: 5 mg/dL — AB
Leukocytes,Ua: NEGATIVE
Nitrite: NEGATIVE
Protein, ur: NEGATIVE mg/dL
Specific Gravity, Urine: 1.006 (ref 1.005–1.030)
pH: 6 (ref 5.0–8.0)

## 2020-10-14 LAB — CBC WITH DIFFERENTIAL/PLATELET
Abs Immature Granulocytes: 0.05 10*3/uL (ref 0.00–0.07)
Basophils Absolute: 0 10*3/uL (ref 0.0–0.1)
Basophils Relative: 0 %
Eosinophils Absolute: 0 10*3/uL (ref 0.0–0.5)
Eosinophils Relative: 0 %
HCT: 38.9 % (ref 36.0–46.0)
Hemoglobin: 13 g/dL (ref 12.0–15.0)
Immature Granulocytes: 1 %
Lymphocytes Relative: 13 %
Lymphs Abs: 1.1 10*3/uL (ref 0.7–4.0)
MCH: 30.2 pg (ref 26.0–34.0)
MCHC: 33.4 g/dL (ref 30.0–36.0)
MCV: 90.5 fL (ref 80.0–100.0)
Monocytes Absolute: 0.1 10*3/uL (ref 0.1–1.0)
Monocytes Relative: 2 %
Neutro Abs: 7.4 10*3/uL (ref 1.7–7.7)
Neutrophils Relative %: 84 %
Platelets: 145 10*3/uL — ABNORMAL LOW (ref 150–400)
RBC: 4.3 MIL/uL (ref 3.87–5.11)
RDW: 12.9 % (ref 11.5–15.5)
WBC: 8.8 10*3/uL (ref 4.0–10.5)
nRBC: 0 % (ref 0.0–0.2)

## 2020-10-14 LAB — LACTIC ACID, PLASMA
Lactic Acid, Venous: 1.1 mmol/L (ref 0.5–1.9)
Lactic Acid, Venous: 1.1 mmol/L (ref 0.5–1.9)

## 2020-10-14 LAB — COMPREHENSIVE METABOLIC PANEL
ALT: 27 U/L (ref 0–44)
AST: 21 U/L (ref 15–41)
Albumin: 3.8 g/dL (ref 3.5–5.0)
Alkaline Phosphatase: 98 U/L (ref 38–126)
Anion gap: 13 (ref 5–15)
BUN: 13 mg/dL (ref 8–23)
CO2: 20 mmol/L — ABNORMAL LOW (ref 22–32)
Calcium: 9.1 mg/dL (ref 8.9–10.3)
Chloride: 100 mmol/L (ref 98–111)
Creatinine, Ser: 0.75 mg/dL (ref 0.44–1.00)
GFR, Estimated: >60 mL/min (ref >60)
Glucose, Bld: 205 mg/dL — ABNORMAL HIGH (ref 70–99)
Potassium: 3.9 mmol/L (ref 3.5–5.1)
Sodium: 133 mmol/L — ABNORMAL LOW (ref 135–145)
Total Bilirubin: 1.9 mg/dL — ABNORMAL HIGH (ref 0.3–1.2)
Total Protein: 6.8 g/dL (ref 6.5–8.1)

## 2020-10-14 LAB — LIPASE, BLOOD: Lipase: 32 U/L (ref 11–51)

## 2020-10-14 LAB — APTT: aPTT: 45 seconds — ABNORMAL HIGH (ref 24–36)

## 2020-10-14 LAB — RESP PANEL BY RT-PCR (FLU A&B, COVID) ARPGX2
Influenza A by PCR: NEGATIVE
Influenza B by PCR: NEGATIVE
SARS Coronavirus 2 by RT PCR: NEGATIVE

## 2020-10-14 LAB — PROTIME-INR
INR: 1.1 (ref 0.8–1.2)
Prothrombin Time: 14.1 seconds (ref 11.4–15.2)

## 2020-10-14 MED ORDER — ALPRAZOLAM 0.25 MG PO TABS
0.2500 mg | ORAL_TABLET | Freq: Once | ORAL | Status: AC
  Administered 2020-10-14: 0.25 mg via ORAL
  Filled 2020-10-14: qty 1

## 2020-10-14 MED ORDER — HEPARIN SOD (PORK) LOCK FLUSH 100 UNIT/ML IV SOLN
500.0000 [IU] | Freq: Once | INTRAVENOUS | Status: AC
  Administered 2020-10-14: 500 [IU]
  Filled 2020-10-14: qty 5

## 2020-10-14 MED ORDER — ACETAMINOPHEN 325 MG PO TABS
650.0000 mg | ORAL_TABLET | Freq: Once | ORAL | Status: AC
  Administered 2020-10-14: 650 mg via ORAL
  Filled 2020-10-14: qty 2

## 2020-10-14 MED ORDER — LACTATED RINGERS IV BOLUS
1000.0000 mL | Freq: Once | INTRAVENOUS | Status: AC
  Administered 2020-10-14: 1000 mL via INTRAVENOUS

## 2020-10-14 NOTE — ED Triage Notes (Signed)
Patient states she is more tired than usual and reports diarrhea since yesterday, thinks she is dehydrated. Took immodium and nausea medication. Has been drinking Pedialyte today, tolerating well. Last treatment was Thursday.

## 2020-10-14 NOTE — ED Notes (Addendum)
Unable to obtain 2nd set of blood cultures because of no vascular access. Tried Right A C with no sucess

## 2020-10-14 NOTE — ED Notes (Signed)
C/o diarrhea x 1 day.  Reports she started a new chemo x 2 days ago.

## 2020-10-14 NOTE — Discharge Instructions (Addendum)
Please call your oncology office on Monday to request close follow-up appointment.  If you develop a fever (greater than 100.5 F), difficulty breathing, chest pain, abdominal pain or other new concerning symptom, come back to ER for reassessment.

## 2020-10-14 NOTE — ED Provider Notes (Signed)
Sign out note  75 y/o lady with notable history of pancreatic adenocarcinoma, received dose of chemo 2 days ago. Here with weakness, diarrhea. Loose brown stool. In triage noted to have borderline fever. Infectious work up started, pt provided IVF.    3:30 PM received sign out from [facility]  6:44 PM reassessed patient, no ongoing symptoms, placed page to oncology; discussed with Dr. Earlie Server, reviewed case including lab work thus far with him, he is comfortable with discharge home this evening; will await urinalysis  8:24 PM UA has resulted and is negative.  Vitals are stable.  Patient remains well-appearing, will discharge home with strict return precautions and plan to follow-up with oncology outpatient.   Lucrezia Starch, MD 10/14/20 2025

## 2020-10-14 NOTE — ED Provider Notes (Addendum)
[name] DEPT Provider Note   CSN: [id number] Arrival date & time: 10/14/20  1304     History Chief Complaint  Patient presents with   Fever   Diarrhea    [name] is a 75 y.o. female.  HPI She complains of generalized weakness, which started yesterday when she was having diarrhea.  She had 3 episodes of brown loose stool, that resolved after he took Imodium.  Today she was able to eat some and currently is not nauseated.  She has not seen any blood in the stool.  She is not she had a fever.  She denies cough, shortness of breath, focal weakness or paresthesia.  She is taking her usual medications.  There are no other known active modifying factors.    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   Jaundice, perinatal, from hepatocellular damage 09/24/2020   Elevated LFTs 09/24/2020   Dark urine 09/24/2020   Dehydration 09/24/2020   Hyperbilirubinemia 09/24/2020   Primary pancreatic cancer with metastasis to other site ([place]) 09/12/2020   Alcohol use, 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[place]) 08/18/2020   Vitamin B12 deficiency 10/20/2019   GAD (generalized anxiety disorder) 10/20/2019   Major depression, recurrent, chronic ([place]) 10/20/2019   Diet-controlled diabetes mellitus ([name]) 10/20/2019   Osteoarthritis, multiple sites 10/20/2019   Presbycusis of both ears 04/13/2019   Essential tremor 04/13/2019   Bilateral primary osteoarthritis of knee 09/23/2017   Acquired hypothyroidism 05/24/2012   Alcohol abuse, daily use 05/24/2012   Essential hypertension    Mixed hyperlipidemia     Past  Surgical History:  Procedure Laterality Date   ABDOMINAL HYSTERECTOMY     BILIARY STENT PLACEMENT N/A 09/28/2020   Procedure: BILIARY STENT PLACEMENT;  Surgeon: Clarene Essex, MD;  Location: [facility] END[facility]COPY;  Service: Endoscopy;  Laterality: N/A;   BREAST BIOPSY     ERCP N/A 09/28/2020   Procedure: ENDOSCOPIC RETROGRADE CHOLANGIOPANCREATOGRAPHY (ERCP);  Surgeon: Clarene Essex, MD;  Location: [facility];  Service: Endoscopy;  Laterality: N/A;   IR IMAGING GUIDED PORT INSERTION  09/22/2020   SPHINCTEROTOMY  09/28/2020   Procedure: Joan Mayans;  Surgeon: Clarene Essex, MD;  Location: [facility];  Service: Endoscopy;;   TUBAL LIGATION       OB History   No obstetric history on file.     Family History  Problem Relation Age of Onset   Diabetes Mother    Alzheimer's disease Mother    Dementia Mother    COPD Father    Emphysema Father    Heart disease Sister    Alcohol abuse Brother    Heart disease Brother    Emphysema Brother    Heart attack Daughter    Cancer Other        breast cancer    Social History   Tobacco Use   Smoking status: Former    Packs/day: 1.00    Years: 10.00    Pack years: 10.00    Types: Cigarettes    Quit date: 05/25/1987    Years since quitting: 33.4   Smokeless tobacco: Never  Vaping Use  Vaping Use: Never used  Substance Use Topics   Alcohol use: Yes    Alcohol/week: 21.0 standard drinks    Types: 21 Glasses of wine per week    Comment: 3 glasses of wine daily, she stopped in 08/2020   Drug use: No    Home Medications Prior to Admission medications   Medication Sig Start Date End Date Taking? Authorizing Provider  ACCU-CHEK AVIVA PLUS test strip  08/11/19   [provider]  Accu-Chek Softclix Lancets lancets  03/30/20   [provider]  ALPRAZolam Duanne Moron) 0.5 MG tablet Take 1 tablet (0.5 mg total) by mouth daily as needed for anxiety. 09/14/20   [name], MD  amLODipine (NORVASC) 5 MG tablet Take 1 tablet (5 mg total) by  mouth daily. 09/14/20   [name], MD  aspirin EC 81 MG tablet Take 81 mg by mouth daily.    [provider]  Blood Glucose Monitoring Suppl (ACCU-CHEK GUIDE) w/Device KIT  10/14/19   [provider]  cyanocobalamin (,VITAMIN B-12,) 1000 MCG/ML injection INJECT 1 ML EVERY 2 WEEKS 08/21/20   [name], MD  docusate sodium (COLACE) 100 MG capsule Take 100 mg by mouth 2 (two) times daily.    [provider]  HYDROcodone-acetaminophen (NORCO/VICODIN) 5-325 MG tablet Take 1 tablet by mouth every 6 (six) hours as needed for moderate pain. 10/12/20   Ladell Pier, MD  levothyroxine (SYNTHROID) 125 MCG tablet TAKE 1 TABLET ON AN EMPTY STOMACH IN THE MORNING 09/14/20   [name], MD  lidocaine-prilocaine (EMLA) cream Apply 1 application topically as needed.    [provider]  omeprazole (PRILOSEC) 20 MG capsule Take 1 capsule (20 mg total) by mouth daily. 07/26/20   [name], MD  ondansetron (ZOFRAN) 8 MG tablet Take 1 tablet (8 mg total) by mouth 2 (two) times daily as needed (Nausea or vomiting). Patient not taking: Reported on 10/12/2020 09/12/20   Truitt Merle, MD  Polyethylene Glycol 3350 (MIRALAX PO) Take by mouth.    [provider]  prochlorperazine (COMPAZINE) 10 MG tablet Take 1 tablet (10 mg total) by mouth every 6 (six) hours as needed (Nausea or vomiting). Patient not taking: Reported on 10/12/2020 09/12/20   Truitt Merle, MD    Allergies    Penicillins  Review of Systems   Review of Systems  All other systems reviewed and are negative.  Physical Exam Updated Vital Signs BP 127/81   Pulse (!) 111   Temp (!) 100.7 F (38.2 C) (Oral)   Resp 15   SpO2 100%   Physical Exam Vitals and nursing note reviewed.  Constitutional:      General: She is not in acute distress.    Appearance: She is well-developed. She is not ill-appearing, toxic-appearing or diaphoretic.  HENT:     Head: Normocephalic and atraumatic.     Right Ear:  External ear normal.     Left Ear: External ear normal.  Eyes:     Conjunctiva/sclera: Conjunctivae normal.     Pupils: Pupils are equal, round, and reactive to light.  Neck:     Trachea: Phonation normal.  Cardiovascular:     Rate and Rhythm: Tachycardia present.  Pulmonary:     Effort: Pulmonary effort is normal.  Abdominal:     General: There is no distension.  Musculoskeletal:        General: Normal range of motion.     Cervical back: Normal range of motion  and neck supple.  Skin:    General: Skin is warm and dry.  Neurological:     Mental Status: She is alert and oriented to person, place, and time.     Cranial Nerves: No cranial nerve deficit.     Sensory: No sensory deficit.     Motor: No abnormal muscle tone.     Coordination: Coordination normal.  Psychiatric:        Mood and Affect: Mood normal.        Behavior: Behavior normal.        Thought Content: Thought content normal.        Judgment: Judgment normal.    ED Results / Procedures / Treatments   Labs (all labs ordered are listed, but only abnormal results are displayed) Labs Reviewed  URINE CULTURE  CULTURE, BLOOD (ROUTINE X 2)  CULTURE, BLOOD (ROUTINE X 2)  RESP PANEL BY RT-PCR (FLU A&B, COVID) ARPGX2  LACTIC ACID, PLASMA  LACTIC ACID, PLASMA  COMPREHENSIVE METABOLIC PANEL  CBC WITH DIFFERENTIAL/PLATELET  PROTIME-INR  APTT  URINALYSIS, ROUTINE W REFLEX MICROSCOPIC  LIPASE, BLOOD    EKG EKG Interpretation  Date/Time:  Saturday October 14 2020 14:07:34 EDT Ventricular Rate:  119 PR Interval:  169 QRS Duration: 89 QT Interval:  314 QTC Calculation: 442 R Axis:   11 Text Interpretation: Sinus tachycardia Inferior infarct, old Since last tracing rate faster Otherwise no significant change Confirmed by Daleen Bo [phone]) on 10/14/2020 2:29:08 PM  Radiology DG Chest Port 1 View  Result Date: 10/14/2020 CLINICAL DATA:  Questionable sepsis. Of evaluate for abnormality. Recent chemotherapy. EXAM:  PORTABLE CHEST 1 VIEW COMPARISON:  August 18, 2020 FINDINGS: The right Port-A-Cath terminates in the central SVC. No pneumothorax. The heart, hila, and mediastinum are normal. No pulmonary nodules or masses. No focal infiltrates. IMPRESSION: No cause for sepsis identified. Electronically Signed   By: Dorise Bullion III M.D   On: 10/14/2020 14:48    Procedures .Critical Care  Date/Time: 10/14/2020 3:24 PM Performed by: Daleen Bo, MD Authorized by: Daleen Bo, MD   Critical care provider statement:    Critical care time (minutes):  35   Critical care start time:  10/14/2020 1:25 PM   Critical care end time:  10/14/2020 3:24 PM   Critical care time was exclusive of:  Separately billable procedures and treating other patients   Critical care was necessary to treat or prevent imminent or life-threatening deterioration of the following conditions:  Sepsis   Critical care was time spent personally by me on the following activities:  Blood draw for specimens, development of treatment plan with patient or surrogate, discussions with consultants, evaluation of patient's response to treatment, examination of patient, obtaining history from patient or surrogate, ordering and performing treatments and interventions, ordering and review of laboratory studies, pulse oximetry, re-evaluation of patient's condition, review of old charts and ordering and review of radiographic studies   Medications Ordered in ED Medications  acetaminophen (TYLENOL) tablet 650 mg (650 mg Oral Given 10/14/20 1425)  lactated ringers bolus 1,000 mL (1,000 mLs Intravenous New Bag/Given 10/14/20 1458)    ED Course  I have reviewed the triage vital signs and the nursing notes.  Pertinent labs & imaging results that were available during my care of the patient were reviewed by me and considered in my medical decision making (see chart for details).    MDM Rules/Calculators/A&P  Patient Vitals for  the past 24 hrs:  BP Temp Temp src Pulse Resp SpO2  10/14/20 1500 127/81 -- -- (!) 111 15 100 %  10/14/20 1430 134/80 -- -- (!) 106 18 98 %  10/14/20 1408 129/88 -- -- (!) 119 18 99 %  10/14/20 1329 136/85 (!) 100.7 F (38.2 C) Oral (!) 131 16 98 %     Medical Decision Making:  This patient is presenting for evaluation of weakness and diarrhea, which does require a range of treatment options, and is a complaint that involves a moderate risk of morbidity and mortality. The differential diagnoses include sepsis, dehydration, acute illness with bacterial or viral process. I decided to review old records, and in summary elderly female with pancreatic cancer metastatic, presenting with weakness.  History of hypertension, depression, diabetes and hyperbilirubinemia.  Last chemotherapy infusion was 10/12/2020 I did not require additional historical information from anyone.  Clinical Laboratory Tests Ordered, included  sepsis bundle .  Radiologic Tests Ordered, included chest x-ray.  I independently Visualized: Radiographic images, which show no infiltrate or edema  Cardiac Monitor Tracing which shows sinus tachycardia   Critical Interventions-clinical evaluation, laboratory testing, chest x-ray, high-volume saline bolus, observation  After These Interventions, the Patient was reevaluated and was found to require evaluation for possible evolving sepsis.  Patient had chemotherapy infusion, 2 days ago is presenting with low-grade fever.  Risk for neutropenia and opportunistic infection.  Requires comprehensive evaluation.  CRITICAL CARE-yes Performed by: Daleen Bo  Nursing Notes Reviewed/ Care Coordinated Applicable Imaging Reviewed Interpretation of Laboratory Data incorporated into ED treatment   Care to Dr. Roslynn Amble to evaluate for further treatment as needed based on initial evaluation.    Final Clinical Impression(s) / ED Diagnoses Final diagnoses:  Sepsis, due to unspecified  organism, unspecified whether acute organ dysfunction present [facility])    Rx / DC Orders ED Discharge Orders     None        Daleen Bo, MD 10/14/20 1519    Daleen Bo, MD 10/14/20 1524

## 2020-10-14 NOTE — ED Notes (Signed)
Attempted to obtain 2nd set of blood cultures w/o success.  Pt will only RN to attempt in ACs.  Oncoming RN asked to attempt.

## 2020-10-14 NOTE — ED Provider Notes (Signed)
Emergency Medicine Provider Triage Evaluation Note  [name] , a 75 y.o. female  was evaluated in triage.  Pt complains of abdominal pain as well as diarrhea that began yesterday.  Patient states she has a history of pancreatic cancer and most recently received chemotherapy 2 days ago.  Yesterday she had 3 bouts of nonbloody diarrhea.  She took Imodium and her diarrhea improved.  Denies any nausea, vomiting, chest pain, shortness of breath, urinary complaints.  Physical Exam  BP 136/85 (BP Location: Left Arm)   Pulse (!) 131   Temp (!) 100.7 F (38.2 C) (Oral)   Resp 16   SpO2 98%  Gen:   Awake, no distress   Resp:  Normal effort  MSK:   Moves extremities without difficulty  Other:  Protuberant abdomen that is soft.  Diffuse abdominal tenderness that appears to be worst along the epigastrium.  Medical Decision Making  Medically screening exam initiated at 1:52 PM.  Appropriate orders placed.  [name] was informed that the remainder of the evaluation will be completed by another provider, this initial triage assessment does not replace that evaluation, and the importance of remaining in the ED until their evaluation is complete.  Febrile and tachycardic.  Sepsis labs as well as COVID-19 test obtained.     Rayna Sexton, PA-C 10/14/20 1353    Merryl Hacker, MD 10/15/20 [phone]

## 2020-10-16 ENCOUNTER — Other Ambulatory Visit: Payer: Self-pay

## 2020-10-16 ENCOUNTER — Inpatient Hospital Stay: Payer: Medicare HMO

## 2020-10-16 ENCOUNTER — Telehealth: Payer: Self-pay | Admitting: *Deleted

## 2020-10-16 ENCOUNTER — Other Ambulatory Visit: Payer: Self-pay | Admitting: *Deleted

## 2020-10-16 ENCOUNTER — Encounter: Payer: Self-pay | Admitting: *Deleted

## 2020-10-16 ENCOUNTER — Inpatient Hospital Stay: Payer: Medicare HMO | Attending: Hematology

## 2020-10-16 VITALS — BP 111/66 | HR 81 | Temp 98.1°F | Resp 20

## 2020-10-16 DIAGNOSIS — M1990 Unspecified osteoarthritis, unspecified site: Secondary | ICD-10-CM | POA: Diagnosis not present

## 2020-10-16 DIAGNOSIS — C259 Malignant neoplasm of pancreas, unspecified: Secondary | ICD-10-CM

## 2020-10-16 DIAGNOSIS — G893 Neoplasm related pain (acute) (chronic): Secondary | ICD-10-CM | POA: Insufficient documentation

## 2020-10-16 DIAGNOSIS — R110 Nausea: Secondary | ICD-10-CM | POA: Diagnosis not present

## 2020-10-16 DIAGNOSIS — C251 Malignant neoplasm of body of pancreas: Secondary | ICD-10-CM | POA: Diagnosis not present

## 2020-10-16 DIAGNOSIS — R197 Diarrhea, unspecified: Secondary | ICD-10-CM

## 2020-10-16 DIAGNOSIS — R309 Painful micturition, unspecified: Secondary | ICD-10-CM | POA: Insufficient documentation

## 2020-10-16 DIAGNOSIS — C787 Secondary malignant neoplasm of liver and intrahepatic bile duct: Secondary | ICD-10-CM | POA: Insufficient documentation

## 2020-10-16 DIAGNOSIS — R918 Other nonspecific abnormal finding of lung field: Secondary | ICD-10-CM | POA: Insufficient documentation

## 2020-10-16 DIAGNOSIS — I10 Essential (primary) hypertension: Secondary | ICD-10-CM | POA: Diagnosis not present

## 2020-10-16 DIAGNOSIS — E119 Type 2 diabetes mellitus without complications: Secondary | ICD-10-CM | POA: Diagnosis not present

## 2020-10-16 DIAGNOSIS — Z95828 Presence of other vascular implants and grafts: Secondary | ICD-10-CM

## 2020-10-16 DIAGNOSIS — Z5111 Encounter for antineoplastic chemotherapy: Secondary | ICD-10-CM | POA: Diagnosis not present

## 2020-10-16 LAB — URINE CULTURE

## 2020-10-16 MED ORDER — LOPERAMIDE HCL 2 MG PO CAPS
4.0000 mg | ORAL_CAPSULE | Freq: Once | ORAL | Status: AC
  Administered 2020-10-16: 4 mg via ORAL
  Filled 2020-10-16: qty 2

## 2020-10-16 MED ORDER — SODIUM CHLORIDE 0.9 % IV SOLN
INTRAVENOUS | Status: AC
  Filled 2020-10-16 (×2): qty 250

## 2020-10-16 MED ORDER — LOPERAMIDE HCL 2 MG PO TABS: 4.0000 mg | ORAL_TABLET | Freq: Once | ORAL | Status: DC

## 2020-10-16 MED ORDER — SODIUM CHLORIDE 0.9% FLUSH
10.0000 mL | Freq: Once | INTRAVENOUS | Status: AC
  Administered 2020-10-16: 10 mL via INTRAVENOUS
  Filled 2020-10-16: qty 10

## 2020-10-16 MED ORDER — HEPARIN SOD (PORK) LOCK FLUSH 100 UNIT/ML IV SOLN
500.0000 [IU] | Freq: Once | INTRAVENOUS | Status: AC
  Administered 2020-10-16: 500 [IU] via INTRAVENOUS
  Filled 2020-10-16: qty 5

## 2020-10-16 NOTE — Progress Notes (Signed)
Placed order for home health referral to assist w/symptom management from chemotherapy and assess and help with fluid and nutritional needs. Patient is agreeable to [facility]. Call to [facility] care nurse, [place] to alert her of referral.

## 2020-10-16 NOTE — Progress Notes (Signed)
[place] Work  Clinical Social Work received phone call from patients daughter requesting a referral for home health and home care/home aid.  CSW and patients daughter discussed home health vs home care.  Patients daughter stated she would explore home care options and requested home health referral.  CSW shared information above with RN.    Johnnye Lana, MSW, LCSW, OSW-C Clinical Social Worker [facility] [phone]

## 2020-10-16 NOTE — Telephone Encounter (Signed)
Spoke w/patient re: ER visit on 7/30. She reports she went to ER for temp 100.7 and diarrhea stools x 3. Today, she has had #2 liquid stools and taken Imodium x 2. Fluid intake has been ~ 6-7 cups in last 24 hours. Can't eat, despite anti-nausea med being effective. Reports she feels she is dehydrated and feels weak. Requesting IV fluids in office today. OK per Dr. Benay Spice. High priority scheduling message sent.

## 2020-10-16 NOTE — Patient Instructions (Signed)

## 2020-10-17 ENCOUNTER — Inpatient Hospital Stay: Payer: Medicare HMO

## 2020-10-17 ENCOUNTER — Inpatient Hospital Stay: Payer: Medicare HMO | Admitting: Oncology

## 2020-10-17 ENCOUNTER — Other Ambulatory Visit: Payer: Self-pay

## 2020-10-17 ENCOUNTER — Telehealth: Payer: Self-pay

## 2020-10-17 VITALS — BP 121/75 | HR 102 | Temp 97.8°F | Resp 18 | Ht 65.0 in | Wt 159.0 lb

## 2020-10-17 VITALS — BP 126/85 | HR 99 | Temp 98.0°F | Resp 20

## 2020-10-17 DIAGNOSIS — C259 Malignant neoplasm of pancreas, unspecified: Secondary | ICD-10-CM

## 2020-10-17 DIAGNOSIS — E119 Type 2 diabetes mellitus without complications: Secondary | ICD-10-CM | POA: Diagnosis not present

## 2020-10-17 DIAGNOSIS — Z95828 Presence of other vascular implants and grafts: Secondary | ICD-10-CM

## 2020-10-17 DIAGNOSIS — C251 Malignant neoplasm of body of pancreas: Secondary | ICD-10-CM | POA: Diagnosis not present

## 2020-10-17 DIAGNOSIS — Z5111 Encounter for antineoplastic chemotherapy: Secondary | ICD-10-CM | POA: Diagnosis not present

## 2020-10-17 DIAGNOSIS — R309 Painful micturition, unspecified: Secondary | ICD-10-CM | POA: Diagnosis not present

## 2020-10-17 DIAGNOSIS — R110 Nausea: Secondary | ICD-10-CM | POA: Diagnosis not present

## 2020-10-17 DIAGNOSIS — R197 Diarrhea, unspecified: Secondary | ICD-10-CM | POA: Diagnosis not present

## 2020-10-17 DIAGNOSIS — G893 Neoplasm related pain (acute) (chronic): Secondary | ICD-10-CM | POA: Diagnosis not present

## 2020-10-17 DIAGNOSIS — R918 Other nonspecific abnormal finding of lung field: Secondary | ICD-10-CM | POA: Diagnosis not present

## 2020-10-17 DIAGNOSIS — C787 Secondary malignant neoplasm of liver and intrahepatic bile duct: Secondary | ICD-10-CM | POA: Diagnosis not present

## 2020-10-17 LAB — CMP (CANCER CENTER ONLY)
ALT: 19 U/L (ref 0–44)
AST: 15 U/L (ref 15–41)
Albumin: 3.5 g/dL (ref 3.5–5.0)
Alkaline Phosphatase: 92 U/L (ref 38–126)
Anion gap: 12 (ref 5–15)
BUN: 8 mg/dL (ref 8–23)
CO2: 20 mmol/L — ABNORMAL LOW (ref 22–32)
Calcium: 9 mg/dL (ref 8.9–10.3)
Chloride: 101 mmol/L (ref 98–111)
Creatinine: 0.67 mg/dL (ref 0.44–1.00)
GFR, Estimated: >60 mL/min (ref >60)
Glucose, Bld: 110 mg/dL — ABNORMAL HIGH (ref 70–99)
Potassium: 3.2 mmol/L — ABNORMAL LOW (ref 3.5–5.1)
Sodium: 133 mmol/L — ABNORMAL LOW (ref 135–145)
Total Bilirubin: 1.5 mg/dL — ABNORMAL HIGH (ref 0.3–1.2)
Total Protein: 6.5 g/dL (ref 6.5–8.1)

## 2020-10-17 LAB — URINE CULTURE

## 2020-10-17 MED ORDER — SODIUM CHLORIDE 0.9% FLUSH
10.0000 mL | Freq: Once | INTRAVENOUS | Status: AC
  Administered 2020-10-17: 10 mL via INTRAVENOUS
  Filled 2020-10-17: qty 10

## 2020-10-17 MED ORDER — POTASSIUM CHLORIDE CRYS ER 20 MEQ PO TBCR: 20.0000 meq | EXTENDED_RELEASE_TABLET | Freq: Every day | ORAL | 0 refills | Status: DC

## 2020-10-17 MED ORDER — POTASSIUM CHLORIDE CRYS ER 20 MEQ PO TBCR
20.0000 meq | EXTENDED_RELEASE_TABLET | Freq: Once | ORAL | Status: AC
  Administered 2020-10-17: 20 meq via ORAL
  Filled 2020-10-17: qty 1

## 2020-10-17 MED ORDER — SODIUM CHLORIDE 0.9 % IV SOLN
INTRAVENOUS | Status: DC
  Filled 2020-10-17 (×2): qty 250

## 2020-10-17 MED ORDER — HEPARIN SOD (PORK) LOCK FLUSH 100 UNIT/ML IV SOLN
500.0000 [IU] | Freq: Once | INTRAVENOUS | Status: AC
  Administered 2020-10-17: 500 [IU] via INTRAVENOUS
  Filled 2020-10-17: qty 5

## 2020-10-17 NOTE — Telephone Encounter (Signed)
Patient is going to [facility] today and patient states dr Benay Spice was suppose to give her a stronger medication for diarrhea  stronger than ( imodium ) and would like dr Jonni Sanger to send this  in for her to [facility] in [place] asap today

## 2020-10-17 NOTE — Telephone Encounter (Signed)
TC from Pt stating that she was still having diarrhea after her infusion. Pt stated she had two big loose bowel movements at 530 am this morning and has taken 2 imodium. Pt stated she is waiting to take another dose when its time. Discussed with Dr. Benay Spice. Pt scheduled to come in today to see Dr Benay Spice.

## 2020-10-17 NOTE — Patient Instructions (Signed)

## 2020-10-17 NOTE — Progress Notes (Signed)
[facility] OFFICE PROGRESS NOTE   Diagnosis: Pancreas cancer  INTERVAL HISTORY:   [name] completed cycle 1 gemcitabine/Abraxane on 10/12/2020.  No rash or symptom of allergic reaction.  She developed diarrhea beginning 10/13/2020.  She had a low-grade fever on 10/14/2020.  She was seen in the emergency room.  Urinalysis was negative.  She was given IV fluids. [name] reports persistence of diarrhea.  Imodium helps.  She she received intravenous fluids at the cancer center yesterday.  She reports feeling better after the IV fluids.  She has had 3 loose stools today, last at approximately 10:30 AM.  She has mild nausea.  She has pain with urination.  She is drinking fluids.  Objective:  Vital signs in last 24 hours:  Blood pressure 121/75, pulse (!) 102, temperature 97.8 F (36.6 C), temperature source Oral, resp. rate 18, height '5\' 5"'$  (1.651 m), weight 159 lb (72.1 kg), SpO2 100 %.    HEENT: No thrush or ulcers, the mucous membranes are moist Resp: Lungs clear bilaterally Cardio: Regular rate and rhythm GI: No hepatosplenomegaly, mild diffuse tenderness, no mass Vascular: No leg edema, diminished skin turgor Neuro: Alert and oriented   Portacath/PICC-without erythema  Lab Results:  Lab Results  Component Value Date   WBC 8.8 10/14/2020   HGB 13.0 10/14/2020   HCT 38.9 10/14/2020   MCV 90.5 10/14/2020   PLT 145 (L) 10/14/2020   NEUTROABS 7.4 10/14/2020    CMP  Lab Results  Component Value Date   NA 133 (L) 10/14/2020   K 3.9 10/14/2020   CL 100 10/14/2020   CO2 20 (L) 10/14/2020   GLUCOSE 205 (H) 10/14/2020   BUN 13 10/14/2020   CREATININE 0.75 10/14/2020   CALCIUM 9.1 10/14/2020   PROT 6.8 10/14/2020   ALBUMIN 3.8 10/14/2020   AST 21 10/14/2020   ALT 27 10/14/2020   ALKPHOS 98 10/14/2020   BILITOT 1.9 (H) 10/14/2020   GFRNONAA >60 10/14/2020   GFRAA 71 02/02/2019    Lab Results  Component Value Date   CAN199 17,322 (H) 10/05/2020     Lab Results  Component Value Date   INR 1.1 10/14/2020   LABPROT 14.1 10/14/2020    Imaging:  DG Chest Port 1 View  Result Date: 10/14/2020 CLINICAL DATA:  Questionable sepsis. Of evaluate for abnormality. Recent chemotherapy. EXAM: PORTABLE CHEST 1 VIEW COMPARISON:  August 18, 2020 FINDINGS: The right Port-A-Cath terminates in the central SVC. No pneumothorax. The heart, hila, and mediastinum are normal. No pulmonary nodules or masses. No focal infiltrates. IMPRESSION: No cause for sepsis identified. Electronically Signed   By: Dorise Bullion III M.D   On: 10/14/2020 14:48    Medications: I have reviewed the patient's current medications.   Assessment/Plan: Adenocarcinoma the pancreas body, stage IV (cT4,cN0,pM1) Ultrasound abdomen 08/18/2020-possible hypoechoic pancreas body mass, small hypoechoic liver areas MRI abdomen 08/27/2020-pancreas body mass, multiple hepatic lesions consistent with metastases, right abdominal omental nodularity, pancreas mass extends to the celiac bifurcation and abuts the splenic vein and splenoportal confluence CT chest 09/07/2020-scattered pulmonary nodules concerning for metastases, pancreas body mass, hepatic metastases Ultrasound-guided biopsy of a right liver lesion 09/08/2020-adenocarcinoma consistent with a pancreas primary CT abdomen/pelvis 09/24/2020-pancreas neck mass, omental and peritoneal nodularity-unchanged, multiple hypoenhancing liver masses-unchanged, numerous small pulmonary nodules Cycle 1 gemcitabine/Abraxane 10/04/2020   2.  Pain secondary #1 3.  Diabetes 4.  Hypertension 5.  Osteoarthritis 6.  Port-A-Cath placement 09/22/2020 7.  Admission with jaundice 09/24/2020.  MRI-new abrupt  constriction of the common hepatic duct.  The infiltrative pancreatic mass extends medially in the porta hepatis to obstruct the common hepatic duct.  Multiple right hepatic lobe metastases mildly increased in size.  Stent placed into the common bile duct  09/28/2020.   Disposition: [name] has metastatic pancreas cancer.  She completed 1 cycle of gemcitabine/Abraxane on 10/12/2020.  She had a low-grade fever on 10/14/2020, potentially related to gemcitabine.  She has developed diarrhea over the past 4-5 days.  Diarrhea is not typical of the gemcitabine/Abraxane regimen.  We will check a stool for C. difficile.  [name] will receive intravenous fluids again today.  Diarrhea could also be related to pancreas insufficiency, though the acute onset does not suggest this.  I encouraged her to push oral hydration.  She will return for IV fluids on 10/18/2020.  We will obtain a chemistry panel today.  The etiology of the pain with urination is unclear.  She does not appear to have a urinary tract infection.  Betsy Coder, MD  10/17/2020  2:49 PM

## 2020-10-18 ENCOUNTER — Other Ambulatory Visit: Payer: Self-pay

## 2020-10-18 ENCOUNTER — Inpatient Hospital Stay: Payer: Medicare HMO

## 2020-10-18 VITALS — BP 127/68 | HR 82 | Temp 98.5°F | Resp 18

## 2020-10-18 DIAGNOSIS — R309 Painful micturition, unspecified: Secondary | ICD-10-CM | POA: Diagnosis not present

## 2020-10-18 DIAGNOSIS — G893 Neoplasm related pain (acute) (chronic): Secondary | ICD-10-CM | POA: Diagnosis not present

## 2020-10-18 DIAGNOSIS — R918 Other nonspecific abnormal finding of lung field: Secondary | ICD-10-CM | POA: Diagnosis not present

## 2020-10-18 DIAGNOSIS — Z5111 Encounter for antineoplastic chemotherapy: Secondary | ICD-10-CM | POA: Diagnosis not present

## 2020-10-18 DIAGNOSIS — C251 Malignant neoplasm of body of pancreas: Secondary | ICD-10-CM | POA: Diagnosis not present

## 2020-10-18 DIAGNOSIS — C787 Secondary malignant neoplasm of liver and intrahepatic bile duct: Secondary | ICD-10-CM | POA: Diagnosis not present

## 2020-10-18 DIAGNOSIS — Z95828 Presence of other vascular implants and grafts: Secondary | ICD-10-CM

## 2020-10-18 DIAGNOSIS — E119 Type 2 diabetes mellitus without complications: Secondary | ICD-10-CM | POA: Diagnosis not present

## 2020-10-18 DIAGNOSIS — R197 Diarrhea, unspecified: Secondary | ICD-10-CM

## 2020-10-18 DIAGNOSIS — R110 Nausea: Secondary | ICD-10-CM | POA: Diagnosis not present

## 2020-10-18 MED ORDER — SODIUM CHLORIDE 0.9 % IV SOLN
INTRAVENOUS | Status: DC
  Filled 2020-10-18 (×2): qty 250

## 2020-10-18 MED ORDER — SODIUM CHLORIDE 0.9% FLUSH
10.0000 mL | Freq: Once | INTRAVENOUS | Status: AC
  Administered 2020-10-18: 10 mL via INTRAVENOUS
  Filled 2020-10-18: qty 10

## 2020-10-18 MED ORDER — HEPARIN SOD (PORK) LOCK FLUSH 100 UNIT/ML IV SOLN
500.0000 [IU] | Freq: Once | INTRAVENOUS | Status: AC
  Administered 2020-10-18: 500 [IU] via INTRAVENOUS
  Filled 2020-10-18: qty 5

## 2020-10-18 NOTE — Patient Instructions (Signed)

## 2020-10-19 ENCOUNTER — Other Ambulatory Visit: Payer: Self-pay

## 2020-10-19 ENCOUNTER — Telehealth: Payer: Self-pay

## 2020-10-19 DIAGNOSIS — R197 Diarrhea, unspecified: Secondary | ICD-10-CM

## 2020-10-19 LAB — CULTURE, BLOOD (ROUTINE X 2)
Culture: NO GROWTH
Special Requests: ADEQUATE

## 2020-10-19 NOTE — Telephone Encounter (Signed)
TC from Pt stating she is feeling better but wants to come in for more IV fluids. Per Dr. Benay Spice ok to schedule Pt for IV fluids tomorrow 10/20/20 orders placed in sign and held.

## 2020-10-20 ENCOUNTER — Other Ambulatory Visit: Payer: Self-pay

## 2020-10-20 ENCOUNTER — Inpatient Hospital Stay: Payer: Medicare HMO

## 2020-10-20 VITALS — BP 123/67 | HR 72 | Temp 98.3°F | Resp 18

## 2020-10-20 DIAGNOSIS — R197 Diarrhea, unspecified: Secondary | ICD-10-CM | POA: Diagnosis not present

## 2020-10-20 DIAGNOSIS — Z95828 Presence of other vascular implants and grafts: Secondary | ICD-10-CM

## 2020-10-20 DIAGNOSIS — C787 Secondary malignant neoplasm of liver and intrahepatic bile duct: Secondary | ICD-10-CM | POA: Diagnosis not present

## 2020-10-20 DIAGNOSIS — E119 Type 2 diabetes mellitus without complications: Secondary | ICD-10-CM | POA: Diagnosis not present

## 2020-10-20 DIAGNOSIS — C251 Malignant neoplasm of body of pancreas: Secondary | ICD-10-CM | POA: Diagnosis not present

## 2020-10-20 DIAGNOSIS — G893 Neoplasm related pain (acute) (chronic): Secondary | ICD-10-CM | POA: Diagnosis not present

## 2020-10-20 DIAGNOSIS — R918 Other nonspecific abnormal finding of lung field: Secondary | ICD-10-CM | POA: Diagnosis not present

## 2020-10-20 DIAGNOSIS — R309 Painful micturition, unspecified: Secondary | ICD-10-CM | POA: Diagnosis not present

## 2020-10-20 DIAGNOSIS — Z5111 Encounter for antineoplastic chemotherapy: Secondary | ICD-10-CM | POA: Diagnosis not present

## 2020-10-20 DIAGNOSIS — R110 Nausea: Secondary | ICD-10-CM | POA: Diagnosis not present

## 2020-10-20 MED ORDER — SODIUM CHLORIDE 0.9 % IV SOLN
INTRAVENOUS | Status: DC
  Filled 2020-10-20 (×2): qty 250

## 2020-10-20 MED ORDER — HEPARIN SOD (PORK) LOCK FLUSH 100 UNIT/ML IV SOLN
500.0000 [IU] | Freq: Once | INTRAVENOUS | Status: AC
  Administered 2020-10-20: 500 [IU] via INTRAVENOUS
  Filled 2020-10-20: qty 5

## 2020-10-20 MED ORDER — SODIUM CHLORIDE 0.9% FLUSH
10.0000 mL | Freq: Once | INTRAVENOUS | Status: AC
  Administered 2020-10-20: 10 mL via INTRAVENOUS
  Filled 2020-10-20: qty 10

## 2020-10-20 NOTE — Patient Instructions (Signed)

## 2020-10-20 NOTE — Telephone Encounter (Signed)
Lvm with pt's daughter- [name] to give message below.

## 2020-10-22 ENCOUNTER — Other Ambulatory Visit: Payer: Self-pay | Admitting: Oncology

## 2020-10-22 ENCOUNTER — Other Ambulatory Visit: Payer: Self-pay | Admitting: Family Medicine

## 2020-10-24 ENCOUNTER — Encounter: Payer: Self-pay | Admitting: General Practice

## 2020-10-24 ENCOUNTER — Ambulatory Visit: Payer: Medicare HMO | Admitting: Family Medicine

## 2020-10-24 DIAGNOSIS — G893 Neoplasm related pain (acute) (chronic): Secondary | ICD-10-CM | POA: Diagnosis not present

## 2020-10-24 DIAGNOSIS — C251 Malignant neoplasm of body of pancreas: Secondary | ICD-10-CM | POA: Diagnosis not present

## 2020-10-24 DIAGNOSIS — C787 Secondary malignant neoplasm of liver and intrahepatic bile duct: Secondary | ICD-10-CM | POA: Diagnosis not present

## 2020-10-24 DIAGNOSIS — E782 Mixed hyperlipidemia: Secondary | ICD-10-CM | POA: Diagnosis not present

## 2020-10-24 DIAGNOSIS — C7800 Secondary malignant neoplasm of unspecified lung: Secondary | ICD-10-CM | POA: Diagnosis not present

## 2020-10-24 DIAGNOSIS — E119 Type 2 diabetes mellitus without complications: Secondary | ICD-10-CM | POA: Diagnosis not present

## 2020-10-24 DIAGNOSIS — E039 Hypothyroidism, unspecified: Secondary | ICD-10-CM | POA: Diagnosis not present

## 2020-10-24 DIAGNOSIS — I10 Essential (primary) hypertension: Secondary | ICD-10-CM | POA: Diagnosis not present

## 2020-10-24 DIAGNOSIS — M159 Polyosteoarthritis, unspecified: Secondary | ICD-10-CM | POA: Diagnosis not present

## 2020-10-24 NOTE — Progress Notes (Signed)
[facility] Spiritual Care Note  Rescheduled Spiritual Care appointment for mindfulness practices to Thursday 8/18 at [place], [place], [facility] Pager [phone] Voicemail [phone]

## 2020-10-25 ENCOUNTER — Inpatient Hospital Stay: Payer: Medicare HMO

## 2020-10-25 ENCOUNTER — Inpatient Hospital Stay: Payer: Medicare HMO | Admitting: Nurse Practitioner

## 2020-10-25 ENCOUNTER — Encounter: Payer: Self-pay | Admitting: Nurse Practitioner

## 2020-10-25 ENCOUNTER — Other Ambulatory Visit: Payer: Self-pay

## 2020-10-25 VITALS — BP 121/75 | HR 88 | Temp 98.1°F | Resp 18 | Ht 65.0 in | Wt 157.6 lb

## 2020-10-25 DIAGNOSIS — R918 Other nonspecific abnormal finding of lung field: Secondary | ICD-10-CM | POA: Diagnosis not present

## 2020-10-25 DIAGNOSIS — Z95828 Presence of other vascular implants and grafts: Secondary | ICD-10-CM

## 2020-10-25 DIAGNOSIS — C251 Malignant neoplasm of body of pancreas: Secondary | ICD-10-CM | POA: Diagnosis not present

## 2020-10-25 DIAGNOSIS — G893 Neoplasm related pain (acute) (chronic): Secondary | ICD-10-CM | POA: Diagnosis not present

## 2020-10-25 DIAGNOSIS — R197 Diarrhea, unspecified: Secondary | ICD-10-CM | POA: Diagnosis not present

## 2020-10-25 DIAGNOSIS — R110 Nausea: Secondary | ICD-10-CM | POA: Diagnosis not present

## 2020-10-25 DIAGNOSIS — C787 Secondary malignant neoplasm of liver and intrahepatic bile duct: Secondary | ICD-10-CM | POA: Diagnosis not present

## 2020-10-25 DIAGNOSIS — C259 Malignant neoplasm of pancreas, unspecified: Secondary | ICD-10-CM | POA: Diagnosis not present

## 2020-10-25 DIAGNOSIS — R309 Painful micturition, unspecified: Secondary | ICD-10-CM | POA: Diagnosis not present

## 2020-10-25 DIAGNOSIS — Z5111 Encounter for antineoplastic chemotherapy: Secondary | ICD-10-CM | POA: Diagnosis not present

## 2020-10-25 DIAGNOSIS — E119 Type 2 diabetes mellitus without complications: Secondary | ICD-10-CM | POA: Diagnosis not present

## 2020-10-25 LAB — CMP (CANCER CENTER ONLY)
ALT: 25 U/L (ref 0–44)
AST: 20 U/L (ref 15–41)
Albumin: 4 g/dL (ref 3.5–5.0)
Alkaline Phosphatase: 85 U/L (ref 38–126)
Anion gap: 10 (ref 5–15)
BUN: 10 mg/dL (ref 8–23)
CO2: 22 mmol/L (ref 22–32)
Calcium: 8.8 mg/dL — ABNORMAL LOW (ref 8.9–10.3)
Chloride: 103 mmol/L (ref 98–111)
Creatinine: 0.75 mg/dL (ref 0.44–1.00)
GFR, Estimated: >60 mL/min (ref >60)
Glucose, Bld: 135 mg/dL — ABNORMAL HIGH (ref 70–99)
Potassium: 3.7 mmol/L (ref 3.5–5.1)
Sodium: 135 mmol/L (ref 135–145)
Total Bilirubin: 0.9 mg/dL (ref 0.3–1.2)
Total Protein: 6.8 g/dL (ref 6.5–8.1)

## 2020-10-25 LAB — CBC WITH DIFFERENTIAL (CANCER CENTER ONLY)
Abs Immature Granulocytes: 0.05 10*3/uL (ref 0.00–0.07)
Basophils Absolute: 0 10*3/uL (ref 0.0–0.1)
Basophils Relative: 1 %
Eosinophils Absolute: 0.1 10*3/uL (ref 0.0–0.5)
Eosinophils Relative: 2 %
HCT: 38.5 % (ref 36.0–46.0)
Hemoglobin: 13 g/dL (ref 12.0–15.0)
Immature Granulocytes: 1 %
Lymphocytes Relative: 33 %
Lymphs Abs: 2.6 10*3/uL (ref 0.7–4.0)
MCH: 30.6 pg (ref 26.0–34.0)
MCHC: 33.8 g/dL (ref 30.0–36.0)
MCV: 90.6 fL (ref 80.0–100.0)
Monocytes Absolute: 0.7 10*3/uL (ref 0.1–1.0)
Monocytes Relative: 8 %
Neutro Abs: 4.6 10*3/uL (ref 1.7–7.7)
Neutrophils Relative %: 55 %
Platelet Count: 193 10*3/uL (ref 150–400)
RBC: 4.25 MIL/uL (ref 3.87–5.11)
RDW: 14 % (ref 11.5–15.5)
WBC Count: 8.1 10*3/uL (ref 4.0–10.5)
nRBC: 0 % (ref 0.0–0.2)

## 2020-10-25 MED ORDER — HEPARIN SOD (PORK) LOCK FLUSH 100 UNIT/ML IV SOLN
500.0000 [IU] | Freq: Once | INTRAVENOUS | Status: AC
  Administered 2020-10-25: 500 [IU] via INTRAVENOUS
  Filled 2020-10-25: qty 5

## 2020-10-25 MED ORDER — SODIUM CHLORIDE 0.9% FLUSH
10.0000 mL | Freq: Once | INTRAVENOUS | Status: AC
  Administered 2020-10-25: 10 mL via INTRAVENOUS
  Filled 2020-10-25: qty 10

## 2020-10-25 NOTE — Progress Notes (Signed)
  [place] OFFICE PROGRESS NOTE   Diagnosis: Pancreas cancer  INTERVAL HISTORY:   [name] returns as scheduled.  She completed cycle 1 gemcitabine/Abraxane 10/12/2020.  She developed diarrhea, received IV fluids.  She reports no diarrhea for 3+ days.  Appetite is better.  No nausea.  Pain is better.  Objective:  Vital signs in last 24 hours:  Blood pressure 121/75, pulse 88, temperature 98.1 F (36.7 C), temperature source Oral, resp. rate 18, height '5\' 5"'$  (1.651 m), weight 157 lb 9.6 oz (71.5 kg), SpO2 100 %.    HEENT: No thrush or ulcers. Resp: Lungs clear bilaterally. Cardio: Regular rate and rhythm. GI: Abdomen soft and nontender.  No hepatomegaly. Vascular: No leg edema. Port-A-Cath without erythema.  Lab Results:  Lab Results  Component Value Date   WBC 8.1 10/25/2020   HGB 13.0 10/25/2020   HCT 38.5 10/25/2020   MCV 90.6 10/25/2020   PLT 193 10/25/2020   NEUTROABS 4.6 10/25/2020    Imaging:  No results found.  Medications: I have reviewed the patient's current medications.  Assessment/Plan: Adenocarcinoma the pancreas body, stage IV (cT4,cN0,pM1) Ultrasound abdomen 08/18/2020-possible hypoechoic pancreas body mass, small hypoechoic liver areas MRI abdomen 08/27/2020-pancreas body mass, multiple hepatic lesions consistent with metastases, right abdominal omental nodularity, pancreas mass extends to the celiac bifurcation and abuts the splenic vein and splenoportal confluence CT chest 09/07/2020-scattered pulmonary nodules concerning for metastases, pancreas body mass, hepatic metastases Ultrasound-guided biopsy of a right liver lesion 09/08/2020-adenocarcinoma consistent with a pancreas primary CT abdomen/pelvis 09/24/2020-pancreas neck mass, omental and peritoneal nodularity-unchanged, multiple hypoenhancing liver masses-unchanged, numerous small pulmonary nodules Cycle 1 gemcitabine/Abraxane 10/04/2020 Cycle 2 gemcitabine/Abraxane 10/27/2020    2.  Pain secondary #1 3.  Diabetes 4.  Hypertension 5.  Osteoarthritis 6.  Port-A-Cath placement 09/22/2020 7.  Admission with jaundice 09/24/2020.  MRI-new abrupt constriction of the common hepatic duct.  The infiltrative pancreatic mass extends medially in the porta hepatis to obstruct the common hepatic duct.  Multiple right hepatic lobe metastases mildly increased in size.  Stent placed into the common bile duct 09/28/2020.    Disposition: [name] appears stable.  She has completed 1 cycle of gemcitabine/Abraxane.  The diarrhea has resolved, etiology unclear.  Diarrhea would be unusual with this regimen.  She overall is feeling better.  Plan to proceed with cycle 2 10/27/2020.  We reviewed the CBC and chemistry panel.  Labs are adequate to proceed with treatment.  Bilirubin is now in normal range.  She would like to receive additional IV fluids with each chemotherapy infusion.  We will make arrangements for 500 cc of normal saline over 1 hour.  She will return for lab, follow-up, Gemcitabine/Abraxane in approximately 2 weeks.  We are available to see her sooner if needed.    Ned Card ANP/GNP-BC   10/25/2020  1:58 PM

## 2020-10-25 NOTE — Patient Instructions (Signed)
Implanted Port Home Guide An implanted port is a device that is placed under the skin. It is usually placed in the chest. The device can be used to give IV medicine, to take blood, or for dialysis. You may have an implanted port if: You need IV medicine that would be irritating to the small veins in your hands or arms. You need IV medicines, such as antibiotics, for a long period of time. You need IV nutrition for a long period of time. You need dialysis. When you have a port, your health care provider can choose to use the port instead of veins in your arms for these procedures. You may have fewer limitations when using a port than you would if you used other types of long-term IVs, and you will likely be able to return to normal activities afteryour incision heals. An implanted port has two main parts: Reservoir. The reservoir is the part where a needle is inserted to give medicines or draw blood. The reservoir is round. After it is placed, it appears as a small, raised area under your skin. Catheter. The catheter is a thin, flexible tube that connects the reservoir to a vein. Medicine that is inserted into the reservoir goes into the catheter and then into the vein. How is my port accessed? To access your port: A numbing cream may be placed on the skin over the port site. Your health care provider will put on a mask and sterile gloves. The skin over your port will be cleaned carefully with a germ-killing soap and allowed to dry. Your health care provider will gently pinch the port and insert a needle into it. Your health care provider will check for a blood return to make sure the port is in the vein and is not clogged. If your port needs to remain accessed to get medicine continuously (constant infusion), your health care provider will place a clear bandage (dressing) over the needle site. The dressing and needle will need to be changed every week, or as told by your health care provider. What  is flushing? Flushing helps keep the port from getting clogged. Follow instructions from your health care provider about how and when to flush the port. Ports are usually flushed with saline solution or a medicine called heparin. The need for flushing will depend on how the port is used: If the port is only used from time to time to give medicines or draw blood, the port may need to be flushed: Before and after medicines have been given. Before and after blood has been drawn. As part of routine maintenance. Flushing may be recommended every 4-6 weeks. If a constant infusion is running, the port may not need to be flushed. Throw away any syringes in a disposal container that is meant for sharp items (sharps container). You can buy a sharps container from a pharmacy, or you can make one by using an empty hard plastic bottle with a cover. How long will my port stay implanted? The port can stay in for as long as your health care provider thinks it is needed. When it is time for the port to come out, a surgery will be done to remove it. The surgery will be similar to the procedure that was done to putthe port in. Follow these instructions at home:  Flush your port as told by your health care provider. If you need an infusion over several days, follow instructions from your health care provider about how to take   care of your port site. Make sure you: Wash your hands with soap and water before you change your dressing. If soap and water are not available, use alcohol-based hand sanitizer. Change your dressing as told by your health care provider. Place any used dressings or infusion bags into a plastic bag. Throw that bag in the trash. Keep the dressing that covers the needle clean and dry. Do not get it wet. Do not use scissors or sharp objects near the tube. Keep the tube clamped, unless it is being used. Check your port site every day for signs of infection. Check for: Redness, swelling, or  pain. Fluid or blood. Pus or a bad smell. Protect the skin around the port site. Avoid wearing bra straps that rub or irritate the site. Protect the skin around your port from seat belts. Place a soft pad over your chest if needed. Bathe or shower as told by your health care provider. The site may get wet as long as you are not actively receiving an infusion. Return to your normal activities as told by your health care provider. Ask your health care provider what activities are safe for you. Carry a medical alert card or wear a medical alert bracelet at all times. This will let health care providers know that you have an implanted port in case of an emergency. Get help right away if: You have redness, swelling, or pain at the port site. You have fluid or blood coming from your port site. You have pus or a bad smell coming from the port site. You have a fever. Summary Implanted ports are usually placed in the chest for long-term IV access. Follow instructions from your health care provider about flushing the port and changing bandages (dressings). Take care of the area around your port by avoiding clothing that puts pressure on the area, and by watching for signs of infection. Protect the skin around your port from seat belts. Place a soft pad over your chest if needed. Get help right away if you have a fever or you have redness, swelling, pain, drainage, or a bad smell at the port site. This information is not intended to replace advice given to you by your health care provider. Make sure you discuss any questions you have with your healthcare provider. Document Revised: 07/19/2019 Document Reviewed: 07/19/2019 Elsevier Patient Education  2022 Elsevier Inc.  

## 2020-10-26 ENCOUNTER — Encounter: Payer: Medicare HMO | Admitting: Family Medicine

## 2020-10-27 ENCOUNTER — Inpatient Hospital Stay: Payer: Medicare HMO

## 2020-10-27 ENCOUNTER — Inpatient Hospital Stay: Payer: Medicare HMO | Admitting: Oncology

## 2020-10-27 ENCOUNTER — Other Ambulatory Visit: Payer: Self-pay

## 2020-10-27 VITALS — BP 113/74 | HR 95 | Temp 97.7°F | Resp 18

## 2020-10-27 DIAGNOSIS — C259 Malignant neoplasm of pancreas, unspecified: Secondary | ICD-10-CM

## 2020-10-27 DIAGNOSIS — R309 Painful micturition, unspecified: Secondary | ICD-10-CM | POA: Diagnosis not present

## 2020-10-27 DIAGNOSIS — Z5111 Encounter for antineoplastic chemotherapy: Secondary | ICD-10-CM | POA: Diagnosis not present

## 2020-10-27 DIAGNOSIS — C251 Malignant neoplasm of body of pancreas: Secondary | ICD-10-CM | POA: Diagnosis not present

## 2020-10-27 DIAGNOSIS — C787 Secondary malignant neoplasm of liver and intrahepatic bile duct: Secondary | ICD-10-CM | POA: Diagnosis not present

## 2020-10-27 DIAGNOSIS — R110 Nausea: Secondary | ICD-10-CM | POA: Diagnosis not present

## 2020-10-27 DIAGNOSIS — R918 Other nonspecific abnormal finding of lung field: Secondary | ICD-10-CM | POA: Diagnosis not present

## 2020-10-27 DIAGNOSIS — E119 Type 2 diabetes mellitus without complications: Secondary | ICD-10-CM | POA: Diagnosis not present

## 2020-10-27 DIAGNOSIS — G893 Neoplasm related pain (acute) (chronic): Secondary | ICD-10-CM | POA: Diagnosis not present

## 2020-10-27 DIAGNOSIS — R197 Diarrhea, unspecified: Secondary | ICD-10-CM | POA: Diagnosis not present

## 2020-10-27 MED ORDER — SODIUM CHLORIDE 0.9% FLUSH
10.0000 mL | INTRAVENOUS | Status: DC | PRN
  Filled 2020-10-27: qty 10

## 2020-10-27 MED ORDER — PACLITAXEL PROTEIN-BOUND CHEMO INJECTION 100 MG
100.0000 mg/m2 | Freq: Once | INTRAVENOUS | Status: AC
  Administered 2020-10-27: 175 mg via INTRAVENOUS
  Filled 2020-10-27: qty 35

## 2020-10-27 MED ORDER — SODIUM CHLORIDE 0.9 % IV SOLN
INTRAVENOUS | Status: AC
  Filled 2020-10-27: qty 250

## 2020-10-27 MED ORDER — PROCHLORPERAZINE MALEATE 10 MG PO TABS
10.0000 mg | ORAL_TABLET | Freq: Once | ORAL | Status: AC
  Administered 2020-10-27: 10 mg via ORAL
  Filled 2020-10-27: qty 1

## 2020-10-27 MED ORDER — SODIUM CHLORIDE 0.9 % IV SOLN
800.0000 mg/m2 | Freq: Once | INTRAVENOUS | Status: AC
  Administered 2020-10-27: 1482 mg via INTRAVENOUS
  Filled 2020-10-27: qty 26.3

## 2020-10-27 MED ORDER — HEPARIN SOD (PORK) LOCK FLUSH 100 UNIT/ML IV SOLN
500.0000 [IU] | Freq: Once | INTRAVENOUS | Status: DC | PRN
  Filled 2020-10-27: qty 5

## 2020-10-27 MED ORDER — SODIUM CHLORIDE 0.9 % IV SOLN
Freq: Once | INTRAVENOUS | Status: AC
  Filled 2020-10-27: qty 250

## 2020-10-27 NOTE — Patient Instructions (Signed)
[facility]  Discharge Instructions: Thank you for choosing [facility] to provide your oncology and hematology care.   If you have a lab appointment with the [facility], please go directly to the [facility] and check in at the registration area.   Wear comfortable clothing and clothing appropriate for easy access to any Portacath or PICC line.   We strive to give you quality time with your provider. You may need to reschedule your appointment if you arrive late (15 or more minutes).  Arriving late affects you and other patients whose appointments are after yours.  Also, if you miss three or more appointments without notifying the office, you may be dismissed from the clinic at the provider's discretion.      For prescription refill requests, have your pharmacy contact our office and allow 72 hours for refills to be completed.    Today you received the following chemotherapy and/or immunotherapy agents: Paclitaxel protein-bound, Gemcitabine     To help prevent nausea and vomiting after your treatment, we encourage you to take your nausea medication as directed.  BELOW ARE SYMPTOMS THAT SHOULD BE REPORTED IMMEDIATELY: *FEVER GREATER THAN 100.4 F (38 C) OR HIGHER *CHILLS OR SWEATING *NAUSEA AND VOMITING THAT IS NOT CONTROLLED WITH YOUR NAUSEA MEDICATION *UNUSUAL SHORTNESS OF BREATH *UNUSUAL BRUISING OR BLEEDING *URINARY PROBLEMS (pain or burning when urinating, or frequent urination) *BOWEL PROBLEMS (unusual diarrhea, constipation, pain near the anus) TENDERNESS IN MOUTH AND THROAT WITH OR WITHOUT PRESENCE OF ULCERS (sore throat, sores in mouth, or a toothache) UNUSUAL RASH, SWELLING OR PAIN  UNUSUAL VAGINAL DISCHARGE OR ITCHING   Items with * indicate a potential emergency and should be followed up as soon as possible or go to the Emergency Department if any problems should occur.  Please show the CHEMOTHERAPY ALERT CARD or IMMUNOTHERAPY  ALERT CARD at check-in to the Emergency Department and triage nurse.  Should you have questions after your visit or need to cancel or reschedule your appointment, please contact [facility]  Dept: [phone]  and follow the prompts.  Office hours are 8:00 a.m. to 4:30 p.m. Monday - Friday. Please note that voicemails left after 4:00 p.m. may not be returned until the following business day.  We are closed weekends and major holidays. You have access to a nurse at all times for urgent questions. Please call the main number to the clinic Dept: [phone] and follow the prompts.   For any non-urgent questions, you may also contact your provider using MyChart. We now offer e-Visits for anyone 26 and older to request care online for non-urgent symptoms. For details visit [email or web address].   Also download the MyChart app! Go to the app store, search "MyChart", open the app, select [facility], and log in with your MyChart username and password.  Due to Covid, a mask is required upon entering the hospital/clinic. If you do not have a mask, one will be given to you upon arrival. For doctor visits, patients may have 1 support person aged 73 or older with them. For treatment visits, patients cannot have anyone with them due to current Covid guidelines and our immunocompromised population.   Nanoparticle Albumin-Bound Paclitaxel injection What is this medication? NANOPARTICLE ALBUMIN-BOUND PACLITAXEL (Na no PAHR ti kuhl al BYOO muhn-bound PAK li TAX el) is a chemotherapy drug. It targets fast dividing cells, like cancer cells, and causes these cells to die. This medicine is used to treatadvanced breast  cancer, lung cancer, and pancreatic cancer. This medicine may be used for other purposes; ask your health care provider orpharmacist if you have questions. COMMON BRAND NAME(S): Abraxane What should I tell my care team before I take this medication? They need to know if you  have any of these conditions: kidney disease liver disease low blood counts, like low white cell, platelet, or red cell counts lung or breathing disease, like asthma tingling of the fingers or toes, or other nerve disorder an unusual or allergic reaction to paclitaxel, albumin, other chemotherapy, other medicines, foods, dyes, or preservatives pregnant or trying to get pregnant breast-feeding How should I use this medication? This drug is given as an infusion into a vein. It is administered in a hospitalor clinic by a specially trained health care professional. Talk to your pediatrician regarding the use of this medicine in children.Special care may be needed. Overdosage: If you think you have taken too much of this medicine contact apoison control center or emergency room at once. NOTE: This medicine is only for you. Do not share this medicine with others. What if I miss a dose? It is important not to miss your dose. Call your doctor or health careprofessional if you are unable to keep an appointment. What may interact with this medication? This medicine may interact with the following medications: antiviral medicines for hepatitis, HIV or AIDS certain antibiotics like erythromycin and clarithromycin certain medicines for fungal infections like ketoconazole and itraconazole certain medicines for seizures like carbamazepine, phenobarbital, phenytoin gemfibrozil nefazodone rifampin St. John's wort This list may not describe all possible interactions. Give your health care provider a list of all the medicines, herbs, non-prescription drugs, or dietary supplements you use. Also tell them if you smoke, drink alcohol, or use illegaldrugs. Some items may interact with your medicine. What should I watch for while using this medication? Your condition will be monitored carefully while you are receiving this medicine. You will need important blood work done while you are taking thismedicine. This  medicine can cause serious allergic reactions. If you experience allergic reactions like skin rash, itching or hives, swelling of the face, lips, ortongue, tell your doctor or health care professional right away. In some cases, you may be given additional medicines to help with side effects.Follow all directions for their use. This drug may make you feel generally unwell. This is not uncommon, as chemotherapy can affect healthy cells as well as cancer cells. Report any side effects. Continue your course of treatment even though you feel ill unless yourdoctor tells you to stop. Call your doctor or health care professional for advice if you get a fever, chills or sore throat, or other symptoms of a cold or flu. Do not treat yourself. This drug decreases your body's ability to fight infections. Try toavoid being around people who are sick. This medicine may increase your risk to bruise or bleed. Call your doctor orhealth care professional if you notice any unusual bleeding. Be careful brushing and flossing your teeth or using a toothpick because you may get an infection or bleed more easily. If you have any dental work done,tell your dentist you are receiving this medicine. Avoid taking products that contain aspirin, acetaminophen, ibuprofen, naproxen, or ketoprofen unless instructed by your doctor. These medicines may hide afever. Do not become pregnant while taking this medicine or for 6 months after stopping it. Women should inform their doctor if they wish to become pregnant or think they might be pregnant. Men should not  father a child while taking this medicine or for 3 months after stopping it. There is a potential for serious side effects to an unborn child. Talk to your health care professionalor pharmacist for more information. Do not breast-feed an infant while taking this medicine or for 2 weeks afterstopping it. This medicine may interfere with the ability to get pregnant or to father a child. You  should talk to your doctor or health care professional if you areconcerned about your fertility. What side effects may I notice from receiving this medication? Side effects that you should report to your doctor or health care professionalas soon as possible: allergic reactions like skin rash, itching or hives, swelling of the face, lips, or tongue breathing problems changes in vision fast, irregular heartbeat low blood pressure mouth sores pain, tingling, numbness in the hands or feet signs of decreased platelets or bleeding - bruising, pinpoint red spots on the skin, black, tarry stools, blood in the urine signs of decreased red blood cells - unusually weak or tired, feeling faint or lightheaded, falls signs of infection - fever or chills, cough, sore throat, pain or difficulty passing urine signs and symptoms of liver injury like dark yellow or brown urine; general ill feeling or flu-like symptoms; light-colored stools; loss of appetite; nausea; right upper belly pain; unusually weak or tired; yellowing of the eyes or skin swelling of the ankles, feet, hands unusually slow heartbeat Side effects that usually do not require medical attention (report to yourdoctor or health care professional if they continue or are bothersome): diarrhea hair loss loss of appetite nausea, vomiting tiredness This list may not describe all possible side effects. Call your doctor for medical advice about side effects. You may report side effects to FDA at1-800-FDA-1088. Where should I keep my medication? This drug is given in a hospital or clinic and will not be stored at home. NOTE: This sheet is a summary. It may not cover all possible information. If you have questions about this medicine, talk to your doctor, pharmacist, orhealth care provider.  2022 Elsevier/Gold Standard (2016-11-05 13:03:45)  Gemcitabine injection What is this medication? GEMCITABINE (jem SYE ta been) is a chemotherapy drug. This  medicine is used to treat many types of cancer like breast cancer, lung cancer, pancreatic cancer,and ovarian cancer. This medicine may be used for other purposes; ask your health care provider orpharmacist if you have questions. COMMON BRAND NAME(S): Gemzar, Infugem What should I tell my care team before I take this medication? They need to know if you have any of these conditions: blood disorders infection kidney disease liver disease lung or breathing disease, like asthma recent or ongoing radiation therapy an unusual or allergic reaction to gemcitabine, other chemotherapy, other medicines, foods, dyes, or preservatives pregnant or trying to get pregnant breast-feeding How should I use this medication? This drug is given as an infusion into a vein. It is administered in a hospitalor clinic by a specially trained health care professional. Talk to your pediatrician regarding the use of this medicine in children.Special care may be needed. Overdosage: If you think you have taken too much of this medicine contact apoison control center or emergency room at once. NOTE: This medicine is only for you. Do not share this medicine with others. What if I miss a dose? It is important not to miss your dose. Call your doctor or health careprofessional if you are unable to keep an appointment. What may interact with this medication? medicines to increase blood counts like  filgrastim, pegfilgrastim, sargramostim some other chemotherapy drugs like cisplatin vaccines Talk to your doctor or health care professional before taking any of thesemedicines: acetaminophen aspirin ibuprofen ketoprofen naproxen This list may not describe all possible interactions. Give your health care provider a list of all the medicines, herbs, non-prescription drugs, or dietary supplements you use. Also tell them if you smoke, drink alcohol, or use illegaldrugs. Some items may interact with your medicine. What should I  watch for while using this medication? Visit your doctor for checks on your progress. This drug may make you feel generally unwell. This is not uncommon, as chemotherapy can affect healthy cells as well as cancer cells. Report any side effects. Continue your course oftreatment even though you feel ill unless your doctor tells you to stop. In some cases, you may be given additional medicines to help with side effects.Follow all directions for their use. Call your doctor or health care professional for advice if you get a fever, chills or sore throat, or other symptoms of a cold or flu. Do not treat yourself. This drug decreases your body's ability to fight infections. Try toavoid being around people who are sick. This medicine may increase your risk to bruise or bleed. Call your doctor orhealth care professional if you notice any unusual bleeding. Be careful brushing and flossing your teeth or using a toothpick because you may get an infection or bleed more easily. If you have any dental work done,tell your dentist you are receiving this medicine. Avoid taking products that contain aspirin, acetaminophen, ibuprofen, naproxen, or ketoprofen unless instructed by your doctor. These medicines may hide afever. Do not become pregnant while taking this medicine or for 6 months after stopping it. Women should inform their doctor if they wish to become pregnant or think they might be pregnant. Men should not father a child while taking this medicine and for 3 months after stopping it. There is a potential for serious side effects to an unborn child. Talk to your health care professional or pharmacist for more information. Do not breast-feed an infant while takingthis medicine or for at least 1 week after stopping it. Men should inform their doctors if they wish to father a child. This medicine may lower sperm counts. Talk with your doctor or health care professional ifyou are concerned about your fertility. What side  effects may I notice from receiving this medication? Side effects that you should report to your doctor or health care professionalas soon as possible: allergic reactions like skin rash, itching or hives, swelling of the face, lips, or tongue breathing problems pain, redness, or irritation at site where injected signs and symptoms of a dangerous change in heartbeat or heart rhythm like chest pain; dizziness; fast or irregular heartbeat; palpitations; feeling faint or lightheaded, falls; breathing problems signs of decreased platelets or bleeding - bruising, pinpoint red spots on the skin, black, tarry stools, blood in the urine signs of decreased red blood cells - unusually weak or tired, feeling faint or lightheaded, falls signs of infection - fever or chills, cough, sore throat, pain or difficulty passing urine signs and symptoms of kidney injury like trouble passing urine or change in the amount of urine signs and symptoms of liver injury like dark yellow or brown urine; general ill feeling or flu-like symptoms; light-colored stools; loss of appetite; nausea; right upper belly pain; unusually weak or tired; yellowing of the eyes or skin swelling of ankles, feet, hands Side effects that usually do not require medical attention (  report to yourdoctor or health care professional if they continue or are bothersome): constipation diarrhea hair loss loss of appetite nausea rash vomiting This list may not describe all possible side effects. Call your doctor for medical advice about side effects. You may report side effects to FDA at1-800-FDA-1088. Where should I keep my medication? This drug is given in a hospital or clinic and will not be stored at home. NOTE: This sheet is a summary. It may not cover all possible information. If you have questions about this medicine, talk to your doctor, pharmacist, orhealth care provider.  2022 Elsevier/Gold Standard (2017-05-28 18:06:11)

## 2020-10-30 ENCOUNTER — Other Ambulatory Visit: Payer: Self-pay

## 2020-10-30 ENCOUNTER — Inpatient Hospital Stay: Payer: Medicare HMO

## 2020-10-30 ENCOUNTER — Telehealth: Payer: Self-pay

## 2020-10-30 VITALS — BP 112/63 | HR 76 | Temp 98.6°F | Resp 18

## 2020-10-30 DIAGNOSIS — R197 Diarrhea, unspecified: Secondary | ICD-10-CM | POA: Diagnosis not present

## 2020-10-30 DIAGNOSIS — C259 Malignant neoplasm of pancreas, unspecified: Secondary | ICD-10-CM

## 2020-10-30 DIAGNOSIS — R110 Nausea: Secondary | ICD-10-CM | POA: Diagnosis not present

## 2020-10-30 DIAGNOSIS — C251 Malignant neoplasm of body of pancreas: Secondary | ICD-10-CM | POA: Diagnosis not present

## 2020-10-30 DIAGNOSIS — R531 Weakness: Secondary | ICD-10-CM | POA: Diagnosis not present

## 2020-10-30 DIAGNOSIS — E119 Type 2 diabetes mellitus without complications: Secondary | ICD-10-CM | POA: Diagnosis not present

## 2020-10-30 DIAGNOSIS — C787 Secondary malignant neoplasm of liver and intrahepatic bile duct: Secondary | ICD-10-CM | POA: Diagnosis not present

## 2020-10-30 DIAGNOSIS — G893 Neoplasm related pain (acute) (chronic): Secondary | ICD-10-CM | POA: Diagnosis not present

## 2020-10-30 DIAGNOSIS — R918 Other nonspecific abnormal finding of lung field: Secondary | ICD-10-CM | POA: Diagnosis not present

## 2020-10-30 DIAGNOSIS — Z95828 Presence of other vascular implants and grafts: Secondary | ICD-10-CM

## 2020-10-30 DIAGNOSIS — R7989 Other specified abnormal findings of blood chemistry: Secondary | ICD-10-CM | POA: Diagnosis not present

## 2020-10-30 DIAGNOSIS — Z5111 Encounter for antineoplastic chemotherapy: Secondary | ICD-10-CM | POA: Diagnosis not present

## 2020-10-30 DIAGNOSIS — R309 Painful micturition, unspecified: Secondary | ICD-10-CM | POA: Diagnosis not present

## 2020-10-30 MED ORDER — SODIUM CHLORIDE 0.9% FLUSH
10.0000 mL | Freq: Once | INTRAVENOUS | Status: AC
  Administered 2020-10-30: 10 mL via INTRAVENOUS

## 2020-10-30 MED ORDER — SODIUM CHLORIDE 0.9 % IV SOLN: Freq: Once | INTRAVENOUS | Status: AC

## 2020-10-30 MED ORDER — SODIUM CHLORIDE 0.9 % IV SOLN: INTRAVENOUS | Status: DC

## 2020-10-30 MED ORDER — HEPARIN SOD (PORK) LOCK FLUSH 100 UNIT/ML IV SOLN
500.0000 [IU] | Freq: Once | INTRAVENOUS | Status: AC
  Administered 2020-10-30: 500 [IU] via INTRAVENOUS

## 2020-10-30 NOTE — Telephone Encounter (Signed)
Patient is calling in wanting to know when she is due for a b12 shot, as she said she didn't need them every 2 weeks.

## 2020-10-30 NOTE — Telephone Encounter (Signed)
If patient would like to go to monthly B12, she will be due 8/27, patient preference

## 2020-10-30 NOTE — Patient Instructions (Signed)

## 2020-10-31 DIAGNOSIS — I10 Essential (primary) hypertension: Secondary | ICD-10-CM | POA: Diagnosis not present

## 2020-10-31 DIAGNOSIS — E782 Mixed hyperlipidemia: Secondary | ICD-10-CM | POA: Diagnosis not present

## 2020-10-31 DIAGNOSIS — E119 Type 2 diabetes mellitus without complications: Secondary | ICD-10-CM | POA: Diagnosis not present

## 2020-10-31 DIAGNOSIS — E039 Hypothyroidism, unspecified: Secondary | ICD-10-CM | POA: Diagnosis not present

## 2020-10-31 DIAGNOSIS — C787 Secondary malignant neoplasm of liver and intrahepatic bile duct: Secondary | ICD-10-CM | POA: Diagnosis not present

## 2020-10-31 DIAGNOSIS — C251 Malignant neoplasm of body of pancreas: Secondary | ICD-10-CM | POA: Diagnosis not present

## 2020-10-31 DIAGNOSIS — M159 Polyosteoarthritis, unspecified: Secondary | ICD-10-CM | POA: Diagnosis not present

## 2020-10-31 DIAGNOSIS — C7800 Secondary malignant neoplasm of unspecified lung: Secondary | ICD-10-CM | POA: Diagnosis not present

## 2020-10-31 DIAGNOSIS — G893 Neoplasm related pain (acute) (chronic): Secondary | ICD-10-CM | POA: Diagnosis not present

## 2020-10-31 NOTE — Telephone Encounter (Signed)
I have scheduled patient.

## 2020-11-01 ENCOUNTER — Encounter: Payer: Self-pay | Admitting: General Practice

## 2020-11-01 DIAGNOSIS — C251 Malignant neoplasm of body of pancreas: Secondary | ICD-10-CM | POA: Diagnosis not present

## 2020-11-01 DIAGNOSIS — C7800 Secondary malignant neoplasm of unspecified lung: Secondary | ICD-10-CM | POA: Diagnosis not present

## 2020-11-01 DIAGNOSIS — C787 Secondary malignant neoplasm of liver and intrahepatic bile duct: Secondary | ICD-10-CM | POA: Diagnosis not present

## 2020-11-01 DIAGNOSIS — M159 Polyosteoarthritis, unspecified: Secondary | ICD-10-CM | POA: Diagnosis not present

## 2020-11-01 DIAGNOSIS — G893 Neoplasm related pain (acute) (chronic): Secondary | ICD-10-CM | POA: Diagnosis not present

## 2020-11-01 DIAGNOSIS — E119 Type 2 diabetes mellitus without complications: Secondary | ICD-10-CM | POA: Diagnosis not present

## 2020-11-01 DIAGNOSIS — E782 Mixed hyperlipidemia: Secondary | ICD-10-CM | POA: Diagnosis not present

## 2020-11-01 DIAGNOSIS — E039 Hypothyroidism, unspecified: Secondary | ICD-10-CM | POA: Diagnosis not present

## 2020-11-01 DIAGNOSIS — I10 Essential (primary) hypertension: Secondary | ICD-10-CM | POA: Diagnosis not present

## 2020-11-01 NOTE — Progress Notes (Signed)
[facility] CSW Progress Notes  Call from daughter, [name].  Wanted information on resources for wig.  Referred to [facility] for their general grant program and information on any additional resources for this purpose and/or to support financial needs of patients w pancreatic cancer.  Family will obtain application form and bring to next appointment for processing.  Edwyna Shell, LCSW Clinical Social Worker Phone:  [phone]

## 2020-11-02 ENCOUNTER — Encounter: Payer: Self-pay | Admitting: Podiatrist

## 2020-11-02 ENCOUNTER — Encounter: Payer: Self-pay | Admitting: General Practice

## 2020-11-02 ENCOUNTER — Other Ambulatory Visit: Payer: Self-pay

## 2020-11-02 ENCOUNTER — Ambulatory Visit: Payer: Medicare HMO | Admitting: Podiatrist

## 2020-11-02 DIAGNOSIS — M79609 Pain in unspecified limb: Secondary | ICD-10-CM | POA: Diagnosis not present

## 2020-11-02 DIAGNOSIS — B351 Tinea unguium: Secondary | ICD-10-CM

## 2020-11-02 DIAGNOSIS — L84 Corns and callosities: Secondary | ICD-10-CM | POA: Diagnosis not present

## 2020-11-02 NOTE — Patient Instructions (Signed)

## 2020-11-02 NOTE — Progress Notes (Signed)
Chief Complaint  Patient presents with   debride    BL nails care and callus at plantar feet Tx: lotion and pumice stone     HPI: Patient is 75 y.o. female who presents today for the concerns as listed above. She is on chemotherapy currently and her physician recommended professional debridement for her toenails and calluses.  She takes chemo at the [facility] and states she is doing well with it.  Denies any tingling or numbness to her feet.    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 site [facility]) 09/12/2020   Alcohol use, 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[place]) 08/18/2020   Vitamin B12 deficiency 10/20/2019   GAD (generalized anxiety disorder) 10/20/2019   Major depression, recurrent, chronic ([place]) 10/20/2019   Diet-controlled diabetes mellitus ([place]) 10/20/2019   Osteoarthritis, multiple sites 10/20/2019   Presbycusis of both ears 04/13/2019   Essential tremor 04/13/2019   Bilateral primary osteoarthritis of knee 09/23/2017   Acquired hypothyroidism 05/24/2012   Alcohol abuse, daily use 05/24/2012   Essential hypertension    Mixed hyperlipidemia     Current Outpatient Medications on File Prior to Visit  Medication Sig Dispense Refill   ACCU-CHEK AVIVA PLUS test strip      Accu-Chek Softclix Lancets lancets      ALPRAZolam (XANAX) 0.5 MG tablet Take 1 tablet (0.5 mg total) by mouth daily as needed for anxiety. (Patient taking differently: Take 0.5 mg by mouth every morning.) 30 tablet 2   amLODipine (NORVASC) 5 MG tablet Take 1 tablet (5 mg total) by mouth daily. (Patient taking differently: Take 5 mg by mouth at bedtime.) 90 tablet 1   aspirin EC 81 MG tablet Take 81 mg by  mouth every morning.     Blood Glucose Monitoring Suppl (ACCU-CHEK GUIDE) w/Device KIT      cyanocobalamin (,VITAMIN B-12,) 1000 MCG/ML injection INJECT 1 ML EVERY 2 WEEKS (Patient taking differently: Inject 1,000 mcg into the muscle every 14 (fourteen) days.) 10 mL 3   docusate sodium (COLACE) 100 MG capsule Take 100 mg by mouth 2 (two) times daily as needed (constipation).     HYDROcodone-acetaminophen (NORCO/VICODIN) 5-325 MG tablet Take 1 tablet by mouth every 6 (six) hours as needed for moderate pain. 60 tablet 0   levothyroxine (SYNTHROID) 125 MCG tablet TAKE 1 TABLET ON AN EMPTY STOMACH IN THE MORNING (Patient taking differently: Take 125 mcg by mouth daily before breakfast.) 90 tablet 1   lidocaine-prilocaine (EMLA) cream Apply 1 application topically as needed (prior to port access).     omeprazole (PRILOSEC) 20 MG capsule TAKE 1 CAPSULE BY MOUTH EVERY DAY 90 capsule 1   ondansetron (ZOFRAN) 8 MG tablet Take 1 tablet (8 mg total) by mouth 2 (two) times daily as needed (Nausea or vomiting). 30 tablet 1   polyethylene glycol (MIRALAX / GLYCOLAX) 17 g packet Take 17 g by mouth daily.     potassium chloride SA (KLOR-CON) 20 MEQ tablet Take 1 tablet (20 mEq total) by mouth daily. 30 tablet 0   prochlorperazine (COMPAZINE) 10 MG tablet Take 1 tablet (10 mg total) by mouth every 6 (six) hours as needed (Nausea or vomiting). 30 tablet 1   No current facility-administered medications on file prior  to visit.    Allergies  Allergen Reactions   Penicillins Hives and Other (See Comments)    Has patient had a PCN reaction causing immediate rash, facial/tongue/throat swelling, SOB or lightheadedness with hypotension: No Has patient had a PCN reaction causing severe rash involving mucus membranes or skin necrosis: No Has patient had a PCN reaction that required hospitalization No Has patient had a PCN reaction occurring within the last 10 years: No If all of the above answers are "NO", then may  proceed with Cephalosporin use.    Review of Systems No fevers, chills, nausea, muscle aches, no difficulty breathing, no calf pain, no chest pain or shortness of breath.   Physical Exam  GENERAL APPEARANCE: Alert, conversant. Appropriately groomed. No acute distress.   VASCULAR: Pedal pulses palpable DP and PT bilateral.  Capillary refill time is immediate to all digits,  Proximal to distal cooling it warm to warm.  Digital perfusion adequate.   NEUROLOGIC: sensation is intact to 5.07 monofilament at 5/5 sites bilateral.  Light touch is intact bilateral, vibratory sensation intact bilateral  MUSCULOSKELETAL: acceptable muscle strength, tone and stability bilateral.  Hallux abducto valgus noted on the right with contracture of digits 2-4 right noted.  Left contracture of fifth digit seen as well.   DERMATOLOGIC: skin is warm, supple, and dry.  Digital nails are thick, discolored, dystrophic, brittle with subungual debris present and clinically mycotic x 10.   Callus present sub fifth metatarsal left with a central hard core.  Mild callus sub second metatarsal right and medial aspect of the right second digit.     Assessment     ICD-10-CM   1. Pain due to onychomycosis of nail  B35.1    M79.609     2. Corn or callus  L84         Plan:  Patient was evaluated and treated and all questions answered.   1) Onychomycosis with pain -Nails palliatively debridement as below. -Educated on self-care  Procedure: Nail Debridement Rationale: Pain Type of Debridement: manual, sharp debridement. Instrumentation: Nail nipper, rotary burr. Number of Nails: 10  -Examined patient. -Patient to continue soft, supportive shoe gear daily. -Toenails 1-5 b/l were debrided in length and girth with sterile nail nippers and dremel without iatrogenic bleeding.  -Patient to report any pedal injuries to medical professional immediately. -Patient/POA to call should there be question/concern in the  interim.  2)Calluses were pared with a 15 blade to a safe level.  Central core of submet 5 was excised-  antibiotic ointment applied covered by a bandaid for padding.  No iatrogenic bleeding noted.   Recommended routine care every 3-4 months or as needed.

## 2020-11-02 NOTE — Progress Notes (Signed)
[place] Spiritual Care Note  Due to conflicts, Ms Heidtman and I need to reschedule this afternoon's appointment. We spoke by phone and plan to talk again next week.   [place], [place], [facility] Pager [phone] Voicemail [phone]

## 2020-11-03 ENCOUNTER — Inpatient Hospital Stay: Payer: Medicare HMO

## 2020-11-03 ENCOUNTER — Other Ambulatory Visit: Payer: Self-pay

## 2020-11-03 VITALS — BP 108/61 | HR 83 | Temp 98.1°F | Resp 18

## 2020-11-03 DIAGNOSIS — C251 Malignant neoplasm of body of pancreas: Secondary | ICD-10-CM | POA: Diagnosis not present

## 2020-11-03 DIAGNOSIS — R197 Diarrhea, unspecified: Secondary | ICD-10-CM | POA: Diagnosis not present

## 2020-11-03 DIAGNOSIS — C787 Secondary malignant neoplasm of liver and intrahepatic bile duct: Secondary | ICD-10-CM | POA: Diagnosis not present

## 2020-11-03 DIAGNOSIS — C259 Malignant neoplasm of pancreas, unspecified: Secondary | ICD-10-CM

## 2020-11-03 DIAGNOSIS — Z5111 Encounter for antineoplastic chemotherapy: Secondary | ICD-10-CM | POA: Diagnosis not present

## 2020-11-03 DIAGNOSIS — G893 Neoplasm related pain (acute) (chronic): Secondary | ICD-10-CM | POA: Diagnosis not present

## 2020-11-03 DIAGNOSIS — M159 Polyosteoarthritis, unspecified: Secondary | ICD-10-CM | POA: Diagnosis not present

## 2020-11-03 DIAGNOSIS — E782 Mixed hyperlipidemia: Secondary | ICD-10-CM | POA: Diagnosis not present

## 2020-11-03 DIAGNOSIS — Z95828 Presence of other vascular implants and grafts: Secondary | ICD-10-CM

## 2020-11-03 DIAGNOSIS — C7800 Secondary malignant neoplasm of unspecified lung: Secondary | ICD-10-CM | POA: Diagnosis not present

## 2020-11-03 DIAGNOSIS — R309 Painful micturition, unspecified: Secondary | ICD-10-CM | POA: Diagnosis not present

## 2020-11-03 DIAGNOSIS — R918 Other nonspecific abnormal finding of lung field: Secondary | ICD-10-CM | POA: Diagnosis not present

## 2020-11-03 DIAGNOSIS — R110 Nausea: Secondary | ICD-10-CM | POA: Diagnosis not present

## 2020-11-03 DIAGNOSIS — E119 Type 2 diabetes mellitus without complications: Secondary | ICD-10-CM | POA: Diagnosis not present

## 2020-11-03 DIAGNOSIS — E039 Hypothyroidism, unspecified: Secondary | ICD-10-CM | POA: Diagnosis not present

## 2020-11-03 DIAGNOSIS — I10 Essential (primary) hypertension: Secondary | ICD-10-CM | POA: Diagnosis not present

## 2020-11-03 MED ORDER — HEPARIN SOD (PORK) LOCK FLUSH 100 UNIT/ML IV SOLN
500.0000 [IU] | Freq: Once | INTRAVENOUS | Status: AC
  Administered 2020-11-03: 500 [IU] via INTRAVENOUS

## 2020-11-03 MED ORDER — SODIUM CHLORIDE 0.9 % IV SOLN: INTRAVENOUS | Status: DC

## 2020-11-03 MED ORDER — SODIUM CHLORIDE 0.9% FLUSH
10.0000 mL | Freq: Once | INTRAVENOUS | Status: AC
  Administered 2020-11-03: 10 mL via INTRAVENOUS

## 2020-11-03 NOTE — Patient Instructions (Signed)

## 2020-11-05 ENCOUNTER — Other Ambulatory Visit: Payer: Self-pay | Admitting: Oncology

## 2020-11-06 ENCOUNTER — Other Ambulatory Visit: Payer: Self-pay

## 2020-11-06 ENCOUNTER — Encounter: Payer: Self-pay | Admitting: General Practice

## 2020-11-06 DIAGNOSIS — I10 Essential (primary) hypertension: Secondary | ICD-10-CM | POA: Diagnosis not present

## 2020-11-06 DIAGNOSIS — C259 Malignant neoplasm of pancreas, unspecified: Secondary | ICD-10-CM

## 2020-11-06 DIAGNOSIS — C251 Malignant neoplasm of body of pancreas: Secondary | ICD-10-CM | POA: Diagnosis not present

## 2020-11-06 DIAGNOSIS — G893 Neoplasm related pain (acute) (chronic): Secondary | ICD-10-CM | POA: Diagnosis not present

## 2020-11-06 DIAGNOSIS — E782 Mixed hyperlipidemia: Secondary | ICD-10-CM | POA: Diagnosis not present

## 2020-11-06 DIAGNOSIS — C787 Secondary malignant neoplasm of liver and intrahepatic bile duct: Secondary | ICD-10-CM | POA: Diagnosis not present

## 2020-11-06 DIAGNOSIS — C7800 Secondary malignant neoplasm of unspecified lung: Secondary | ICD-10-CM | POA: Diagnosis not present

## 2020-11-06 DIAGNOSIS — E039 Hypothyroidism, unspecified: Secondary | ICD-10-CM | POA: Diagnosis not present

## 2020-11-06 DIAGNOSIS — M159 Polyosteoarthritis, unspecified: Secondary | ICD-10-CM | POA: Diagnosis not present

## 2020-11-06 DIAGNOSIS — E119 Type 2 diabetes mellitus without complications: Secondary | ICD-10-CM | POA: Diagnosis not present

## 2020-11-06 NOTE — Progress Notes (Signed)
[facility] Spiritual Care Note  Left voicemail suggested options for rescheduling our appointment for mindfulness meditation coaching. Encouraged return call.   [place], [place], [facility] Pager [phone] Voicemail [phone]

## 2020-11-07 ENCOUNTER — Inpatient Hospital Stay: Payer: Medicare HMO | Admitting: Dietician

## 2020-11-07 ENCOUNTER — Other Ambulatory Visit: Payer: Self-pay

## 2020-11-07 ENCOUNTER — Inpatient Hospital Stay: Payer: Medicare HMO

## 2020-11-07 DIAGNOSIS — R110 Nausea: Secondary | ICD-10-CM | POA: Diagnosis not present

## 2020-11-07 DIAGNOSIS — C259 Malignant neoplasm of pancreas, unspecified: Secondary | ICD-10-CM

## 2020-11-07 DIAGNOSIS — R309 Painful micturition, unspecified: Secondary | ICD-10-CM | POA: Diagnosis not present

## 2020-11-07 DIAGNOSIS — C787 Secondary malignant neoplasm of liver and intrahepatic bile duct: Secondary | ICD-10-CM | POA: Diagnosis not present

## 2020-11-07 DIAGNOSIS — E119 Type 2 diabetes mellitus without complications: Secondary | ICD-10-CM | POA: Diagnosis not present

## 2020-11-07 DIAGNOSIS — Z5111 Encounter for antineoplastic chemotherapy: Secondary | ICD-10-CM | POA: Diagnosis not present

## 2020-11-07 DIAGNOSIS — R197 Diarrhea, unspecified: Secondary | ICD-10-CM | POA: Diagnosis not present

## 2020-11-07 DIAGNOSIS — R918 Other nonspecific abnormal finding of lung field: Secondary | ICD-10-CM | POA: Diagnosis not present

## 2020-11-07 DIAGNOSIS — G893 Neoplasm related pain (acute) (chronic): Secondary | ICD-10-CM | POA: Diagnosis not present

## 2020-11-07 DIAGNOSIS — C251 Malignant neoplasm of body of pancreas: Secondary | ICD-10-CM | POA: Diagnosis not present

## 2020-11-07 MED ORDER — SODIUM CHLORIDE 0.9% FLUSH
10.0000 mL | Freq: Once | INTRAVENOUS | Status: AC
  Administered 2020-11-07: 10 mL via INTRAVENOUS

## 2020-11-07 MED ORDER — HEPARIN SOD (PORK) LOCK FLUSH 100 UNIT/ML IV SOLN
500.0000 [IU] | Freq: Once | INTRAVENOUS | Status: AC
  Administered 2020-11-07: 500 [IU] via INTRAVENOUS

## 2020-11-07 MED ORDER — SODIUM CHLORIDE 0.9 % IV SOLN: INTRAVENOUS | Status: DC

## 2020-11-07 NOTE — Progress Notes (Signed)
Nutrition  Patient scheduled for nutrition follow-up via telephone today. Noted patient scheduled for IV fluids, attempted to contact patient on cell phone. She did not answer. Message left on voicemail with request for return call. Contact information provided.

## 2020-11-07 NOTE — Patient Instructions (Signed)

## 2020-11-08 ENCOUNTER — Other Ambulatory Visit: Payer: Self-pay | Admitting: Oncology

## 2020-11-08 ENCOUNTER — Ambulatory Visit (INDEPENDENT_AMBULATORY_CARE_PROVIDER_SITE_OTHER): Payer: Medicare HMO

## 2020-11-08 ENCOUNTER — Other Ambulatory Visit: Payer: Self-pay | Admitting: Surgery

## 2020-11-08 DIAGNOSIS — E538 Deficiency of other specified B group vitamins: Secondary | ICD-10-CM

## 2020-11-08 DIAGNOSIS — C259 Malignant neoplasm of pancreas, unspecified: Secondary | ICD-10-CM

## 2020-11-08 MED ORDER — CYANOCOBALAMIN 1000 MCG/ML IJ SOLN
1000.0000 ug | Freq: Once | INTRAMUSCULAR | Status: AC
  Administered 2020-11-08: 1000 ug via INTRAMUSCULAR

## 2020-11-08 MED ORDER — POTASSIUM CHLORIDE CRYS ER 20 MEQ PO TBCR
20.0000 meq | EXTENDED_RELEASE_TABLET | Freq: Every day | ORAL | 2 refills | Status: DC
Start: 2020-11-08 — End: 2021-01-04

## 2020-11-08 NOTE — Progress Notes (Signed)
[name] 75 yr old female presents to office for biweekly B12 injection per Billey Chang, MD. Adminstered CYANOCOBALAMIN 1,000 mcg IM left arm. Patient tolerated well.

## 2020-11-09 ENCOUNTER — Inpatient Hospital Stay: Payer: Medicare HMO

## 2020-11-09 ENCOUNTER — Other Ambulatory Visit: Payer: Self-pay

## 2020-11-09 ENCOUNTER — Inpatient Hospital Stay: Payer: Medicare HMO | Admitting: Oncology

## 2020-11-09 VITALS — BP 148/89 | HR 68 | Temp 97.8°F | Resp 18 | Ht 65.0 in | Wt 157.0 lb

## 2020-11-09 DIAGNOSIS — E119 Type 2 diabetes mellitus without complications: Secondary | ICD-10-CM | POA: Diagnosis not present

## 2020-11-09 DIAGNOSIS — C251 Malignant neoplasm of body of pancreas: Secondary | ICD-10-CM | POA: Diagnosis not present

## 2020-11-09 DIAGNOSIS — Z5111 Encounter for antineoplastic chemotherapy: Secondary | ICD-10-CM | POA: Diagnosis not present

## 2020-11-09 DIAGNOSIS — R110 Nausea: Secondary | ICD-10-CM | POA: Diagnosis not present

## 2020-11-09 DIAGNOSIS — C259 Malignant neoplasm of pancreas, unspecified: Secondary | ICD-10-CM

## 2020-11-09 DIAGNOSIS — C787 Secondary malignant neoplasm of liver and intrahepatic bile duct: Secondary | ICD-10-CM | POA: Diagnosis not present

## 2020-11-09 DIAGNOSIS — R197 Diarrhea, unspecified: Secondary | ICD-10-CM | POA: Diagnosis not present

## 2020-11-09 DIAGNOSIS — R309 Painful micturition, unspecified: Secondary | ICD-10-CM | POA: Diagnosis not present

## 2020-11-09 DIAGNOSIS — R918 Other nonspecific abnormal finding of lung field: Secondary | ICD-10-CM | POA: Diagnosis not present

## 2020-11-09 DIAGNOSIS — G893 Neoplasm related pain (acute) (chronic): Secondary | ICD-10-CM | POA: Diagnosis not present

## 2020-11-09 LAB — CMP (CANCER CENTER ONLY)
ALT: 26 U/L (ref 0–44)
AST: 20 U/L (ref 15–41)
Albumin: 3.9 g/dL (ref 3.5–5.0)
Alkaline Phosphatase: 81 U/L (ref 38–126)
Anion gap: 9 (ref 5–15)
BUN: 14 mg/dL (ref 8–23)
CO2: 22 mmol/L (ref 22–32)
Calcium: 9.2 mg/dL (ref 8.9–10.3)
Chloride: 103 mmol/L (ref 98–111)
Creatinine: 0.68 mg/dL (ref 0.44–1.00)
GFR, Estimated: >60 mL/min (ref >60)
Glucose, Bld: 142 mg/dL — ABNORMAL HIGH (ref 70–99)
Potassium: 4.1 mmol/L (ref 3.5–5.1)
Sodium: 134 mmol/L — ABNORMAL LOW (ref 135–145)
Total Bilirubin: 0.7 mg/dL (ref 0.3–1.2)
Total Protein: 6.7 g/dL (ref 6.5–8.1)

## 2020-11-09 LAB — CBC WITH DIFFERENTIAL (CANCER CENTER ONLY)
Abs Immature Granulocytes: 0.03 10*3/uL (ref 0.00–0.07)
Basophils Absolute: 0 10*3/uL (ref 0.0–0.1)
Basophils Relative: 0 %
Eosinophils Absolute: 0.1 10*3/uL (ref 0.0–0.5)
Eosinophils Relative: 2 %
HCT: 39.1 % (ref 36.0–46.0)
Hemoglobin: 12.9 g/dL (ref 12.0–15.0)
Immature Granulocytes: 0 %
Lymphocytes Relative: 33 %
Lymphs Abs: 2.3 10*3/uL (ref 0.7–4.0)
MCH: 30.3 pg (ref 26.0–34.0)
MCHC: 33 g/dL (ref 30.0–36.0)
MCV: 91.8 fL (ref 80.0–100.0)
Monocytes Absolute: 0.7 10*3/uL (ref 0.1–1.0)
Monocytes Relative: 10 %
Neutro Abs: 3.8 10*3/uL (ref 1.7–7.7)
Neutrophils Relative %: 55 %
Platelet Count: 127 10*3/uL — ABNORMAL LOW (ref 150–400)
RBC: 4.26 MIL/uL (ref 3.87–5.11)
RDW: 16 % — ABNORMAL HIGH (ref 11.5–15.5)
WBC Count: 7 10*3/uL (ref 4.0–10.5)
nRBC: 0 % (ref 0.0–0.2)

## 2020-11-09 NOTE — Progress Notes (Signed)
  [place] OFFICE PROGRESS NOTE   Diagnosis: Pancreas cancer  INTERVAL HISTORY:   [name] completed another cycle of gemcitabine/Abraxane on 10/27/2020.  She has developed alopecia.  No nausea or vomiting.  She does not have significant pain.  Good appetite.  She feels better when she receives IV fluids following chemotherapy.  She has noted bruising at the forearms.  Objective:  Vital signs in last 24 hours:  Blood pressure (!) 148/89, pulse 68, temperature 97.8 F (36.6 C), temperature source Oral, resp. rate 18, height '5\' 5"'$  (1.651 m), weight 157 lb (71.2 kg), SpO2 100 %.    HEENT: No thrush or ulcers, yellow cyst at the left pharynx Resp: Lungs clear bilaterally Cardio: Regular rate and rhythm GI: No mass, no hepatosplenomegaly, nontender Vascular: No leg edema  Skin: Changes of senile purpura at the forearm bilaterally  Portacath/PICC-without erythema  Lab Results:  Lab Results  Component Value Date   WBC 7.0 11/09/2020   HGB 12.9 11/09/2020   HCT 39.1 11/09/2020   MCV 91.8 11/09/2020   PLT 127 (L) 11/09/2020   NEUTROABS 3.8 11/09/2020    CMP  Lab Results  Component Value Date   NA 134 (L) 11/09/2020   K 4.1 11/09/2020   CL 103 11/09/2020   CO2 22 11/09/2020   GLUCOSE 142 (H) 11/09/2020   BUN 14 11/09/2020   CREATININE 0.68 11/09/2020   CALCIUM 9.2 11/09/2020   PROT 6.7 11/09/2020   ALBUMIN 3.9 11/09/2020   AST 20 11/09/2020   ALT 26 11/09/2020   ALKPHOS 81 11/09/2020   BILITOT 0.7 11/09/2020   GFRNONAA >60 11/09/2020   GFRAA 71 02/02/2019    Lab Results  Component Value Date   [id number] 17,322 (H) 10/05/2020     Medications: I have reviewed the patient's current medications.   Assessment/Plan: Adenocarcinoma the pancreas body, stage IV (cT4,cN0,pM1) Ultrasound abdomen 08/18/2020-possible hypoechoic pancreas body mass, small hypoechoic liver areas MRI abdomen 08/27/2020-pancreas body mass, multiple hepatic lesions consistent  with metastases, right abdominal omental nodularity, pancreas mass extends to the celiac bifurcation and abuts the splenic vein and splenoportal confluence CT chest 09/07/2020-scattered pulmonary nodules concerning for metastases, pancreas body mass, hepatic metastases Ultrasound-guided biopsy of a right liver lesion 09/08/2020-adenocarcinoma consistent with a pancreas primary CT abdomen/pelvis 09/24/2020-pancreas neck mass, omental and peritoneal nodularity-unchanged, multiple hypoenhancing liver masses-unchanged, numerous small pulmonary nodules Cycle 1 gemcitabine/Abraxane 10/04/2020 Cycle 2 gemcitabine/Abraxane 10/27/2020 Cycle 3 gemcitabine/Abraxane 11/11/2018   2.  Pain secondary #1 3.  Diabetes 4.  Hypertension 5.  Osteoarthritis 6.  Port-A-Cath placement 09/22/2020 7.  Admission with jaundice 09/24/2020.  MRI-new abrupt constriction of the common hepatic duct.  The infiltrative pancreatic mass extends medially in the porta hepatis to obstruct the common hepatic duct.  Multiple right hepatic lobe metastases mildly increased in size.  Stent placed into the common bile duct 09/28/2020.      Disposition: [name] is tolerating the gemcitabine/Abraxane well.  She will complete cycle 3 tomorrow.  We will follow-up on the CA 19-9 from today.  Her overall performance status appears improved.  She will return for an office visit in 2 weeks.  Betsy Coder, MD  11/09/2020  11:48 AM

## 2020-11-10 ENCOUNTER — Inpatient Hospital Stay: Payer: Medicare HMO

## 2020-11-10 VITALS — BP 124/82 | HR 86 | Temp 98.6°F | Resp 20

## 2020-11-10 DIAGNOSIS — R918 Other nonspecific abnormal finding of lung field: Secondary | ICD-10-CM | POA: Diagnosis not present

## 2020-11-10 DIAGNOSIS — E119 Type 2 diabetes mellitus without complications: Secondary | ICD-10-CM | POA: Diagnosis not present

## 2020-11-10 DIAGNOSIS — R309 Painful micturition, unspecified: Secondary | ICD-10-CM | POA: Diagnosis not present

## 2020-11-10 DIAGNOSIS — G893 Neoplasm related pain (acute) (chronic): Secondary | ICD-10-CM | POA: Diagnosis not present

## 2020-11-10 DIAGNOSIS — Z5111 Encounter for antineoplastic chemotherapy: Secondary | ICD-10-CM | POA: Diagnosis not present

## 2020-11-10 DIAGNOSIS — C259 Malignant neoplasm of pancreas, unspecified: Secondary | ICD-10-CM

## 2020-11-10 DIAGNOSIS — R197 Diarrhea, unspecified: Secondary | ICD-10-CM | POA: Diagnosis not present

## 2020-11-10 DIAGNOSIS — C787 Secondary malignant neoplasm of liver and intrahepatic bile duct: Secondary | ICD-10-CM | POA: Diagnosis not present

## 2020-11-10 DIAGNOSIS — R110 Nausea: Secondary | ICD-10-CM | POA: Diagnosis not present

## 2020-11-10 DIAGNOSIS — C251 Malignant neoplasm of body of pancreas: Secondary | ICD-10-CM | POA: Diagnosis not present

## 2020-11-10 LAB — CANCER ANTIGEN 19-9: CA 19-9: 6751 U/mL — ABNORMAL HIGH (ref 0–35)

## 2020-11-10 MED ORDER — SODIUM CHLORIDE 0.9% FLUSH
10.0000 mL | INTRAVENOUS | Status: DC | PRN
  Administered 2020-11-10: 10 mL

## 2020-11-10 MED ORDER — SODIUM CHLORIDE 0.9 % IV SOLN
800.0000 mg/m2 | Freq: Once | INTRAVENOUS | Status: AC
  Administered 2020-11-10: 1482 mg via INTRAVENOUS
  Filled 2020-11-10: qty 12.68

## 2020-11-10 MED ORDER — HEPARIN SOD (PORK) LOCK FLUSH 100 UNIT/ML IV SOLN
500.0000 [IU] | Freq: Once | INTRAVENOUS | Status: AC | PRN
  Administered 2020-11-10: 500 [IU]

## 2020-11-10 MED ORDER — PACLITAXEL PROTEIN-BOUND CHEMO INJECTION 100 MG
100.0000 mg/m2 | Freq: Once | INTRAVENOUS | Status: AC
  Administered 2020-11-10: 175 mg via INTRAVENOUS
  Filled 2020-11-10: qty 35

## 2020-11-10 MED ORDER — PROCHLORPERAZINE MALEATE 10 MG PO TABS
10.0000 mg | ORAL_TABLET | Freq: Once | ORAL | Status: AC
  Administered 2020-11-10: 10 mg via ORAL
  Filled 2020-11-10: qty 1

## 2020-11-10 MED ORDER — SODIUM CHLORIDE 0.9 % IV SOLN: INTRAVENOUS | Status: AC

## 2020-11-10 MED ORDER — SODIUM CHLORIDE 0.9 % IV SOLN: Freq: Once | INTRAVENOUS | Status: AC

## 2020-11-10 NOTE — Patient Instructions (Signed)
[facility]   Discharge Instructions: Thank you for choosing [facility] to provide your oncology and hematology care.   If you have a lab appointment with the [facility], please go directly to the [facility] and check in at the registration area.   Wear comfortable clothing and clothing appropriate for easy access to any Portacath or PICC line.   We strive to give you quality time with your provider. You may need to reschedule your appointment if you arrive late (15 or more minutes).  Arriving late affects you and other patients whose appointments are after yours.  Also, if you miss three or more appointments without notifying the office, you may be dismissed from the clinic at the provider's discretion.      For prescription refill requests, have your pharmacy contact our office and allow 72 hours for refills to be completed.    Today you received the following chemotherapy and/or immunotherapy agents Paclitaxel-protein bound (ABRAXANE) & Gemcitabine (GEMZAR).      To help prevent nausea and vomiting after your treatment, we encourage you to take your nausea medication as directed.  BELOW ARE SYMPTOMS THAT SHOULD BE REPORTED IMMEDIATELY: *FEVER GREATER THAN 100.4 F (38 C) OR HIGHER *CHILLS OR SWEATING *NAUSEA AND VOMITING THAT IS NOT CONTROLLED WITH YOUR NAUSEA MEDICATION *UNUSUAL SHORTNESS OF BREATH *UNUSUAL BRUISING OR BLEEDING *URINARY PROBLEMS (pain or burning when urinating, or frequent urination) *BOWEL PROBLEMS (unusual diarrhea, constipation, pain near the anus) TENDERNESS IN MOUTH AND THROAT WITH OR WITHOUT PRESENCE OF ULCERS (sore throat, sores in mouth, or a toothache) UNUSUAL RASH, SWELLING OR PAIN  UNUSUAL VAGINAL DISCHARGE OR ITCHING   Items with * indicate a potential emergency and should be followed up as soon as possible or go to the Emergency Department if any problems should occur.  Please show the CHEMOTHERAPY ALERT  CARD or IMMUNOTHERAPY ALERT CARD at check-in to the Emergency Department and triage nurse.  Should you have questions after your visit or need to cancel or reschedule your appointment, please contact [facility]  Dept: [phone]  and follow the prompts.  Office hours are 8:00 a.m. to 4:30 p.m. Monday - Friday. Please note that voicemails left after 4:00 p.m. may not be returned until the following business day.  We are closed weekends and major holidays. You have access to a nurse at all times for urgent questions. Please call the main number to the clinic Dept: [phone] and follow the prompts.   For any non-urgent questions, you may also contact your provider using MyChart. We now offer e-Visits for anyone 26 and older to request care online for non-urgent symptoms. For details visit [email or web address].   Also download the MyChart app! Go to the app store, search "MyChart", open the app, select [facility], and log in with your MyChart username and password.  Due to Covid, a mask is required upon entering the hospital/clinic. If you do not have a mask, one will be given to you upon arrival. For doctor visits, patients may have 1 support person aged 35 or older with them. For treatment visits, patients cannot have anyone with them due to current Covid guidelines and our immunocompromised population.   Nanoparticle Albumin-Bound Paclitaxel injection What is this medication? NANOPARTICLE ALBUMIN-BOUND PACLITAXEL (Na no PAHR ti kuhl al BYOO muhn-bound PAK li TAX el) is a chemotherapy drug. It targets fast dividing cells, like cancer cells, and causes these cells to die. This medicine  is used to treatadvanced breast cancer, lung cancer, and pancreatic cancer. This medicine may be used for other purposes; ask your health care provider orpharmacist if you have questions. COMMON BRAND NAME(S): Abraxane What should I tell my care team before I take this  medication? They need to know if you have any of these conditions: kidney disease liver disease low blood counts, like low white cell, platelet, or red cell counts lung or breathing disease, like asthma tingling of the fingers or toes, or other nerve disorder an unusual or allergic reaction to paclitaxel, albumin, other chemotherapy, other medicines, foods, dyes, or preservatives pregnant or trying to get pregnant breast-feeding How should I use this medication? This drug is given as an infusion into a vein. It is administered in a hospitalor clinic by a specially trained health care professional. Talk to your pediatrician regarding the use of this medicine in children.Special care may be needed. Overdosage: If you think you have taken too much of this medicine contact apoison control center or emergency room at once. NOTE: This medicine is only for you. Do not share this medicine with others. What if I miss a dose? It is important not to miss your dose. Call your doctor or health careprofessional if you are unable to keep an appointment. What may interact with this medication? This medicine may interact with the following medications: antiviral medicines for hepatitis, HIV or AIDS certain antibiotics like erythromycin and clarithromycin certain medicines for fungal infections like ketoconazole and itraconazole certain medicines for seizures like carbamazepine, phenobarbital, phenytoin gemfibrozil nefazodone rifampin St. John's wort This list may not describe all possible interactions. Give your health care provider a list of all the medicines, herbs, non-prescription drugs, or dietary supplements you use. Also tell them if you smoke, drink alcohol, or use illegaldrugs. Some items may interact with your medicine. What should I watch for while using this medication? Your condition will be monitored carefully while you are receiving this medicine. You will need important blood work done  while you are taking thismedicine. This medicine can cause serious allergic reactions. If you experience allergic reactions like skin rash, itching or hives, swelling of the face, lips, ortongue, tell your doctor or health care professional right away. In some cases, you may be given additional medicines to help with side effects.Follow all directions for their use. This drug may make you feel generally unwell. This is not uncommon, as chemotherapy can affect healthy cells as well as cancer cells. Report any side effects. Continue your course of treatment even though you feel ill unless yourdoctor tells you to stop. Call your doctor or health care professional for advice if you get a fever, chills or sore throat, or other symptoms of a cold or flu. Do not treat yourself. This drug decreases your body's ability to fight infections. Try toavoid being around people who are sick. This medicine may increase your risk to bruise or bleed. Call your doctor orhealth care professional if you notice any unusual bleeding. Be careful brushing and flossing your teeth or using a toothpick because you may get an infection or bleed more easily. If you have any dental work done,tell your dentist you are receiving this medicine. Avoid taking products that contain aspirin, acetaminophen, ibuprofen, naproxen, or ketoprofen unless instructed by your doctor. These medicines may hide afever. Do not become pregnant while taking this medicine or for 6 months after stopping it. Women should inform their doctor if they wish to become pregnant or think they might  be pregnant. Men should not father a child while taking this medicine or for 3 months after stopping it. There is a potential for serious side effects to an unborn child. Talk to your health care professionalor pharmacist for more information. Do not breast-feed an infant while taking this medicine or for 2 weeks afterstopping it. This medicine may interfere with the ability  to get pregnant or to father a child. You should talk to your doctor or health care professional if you areconcerned about your fertility. What side effects may I notice from receiving this medication? Side effects that you should report to your doctor or health care professionalas soon as possible: allergic reactions like skin rash, itching or hives, swelling of the face, lips, or tongue breathing problems changes in vision fast, irregular heartbeat low blood pressure mouth sores pain, tingling, numbness in the hands or feet signs of decreased platelets or bleeding - bruising, pinpoint red spots on the skin, black, tarry stools, blood in the urine signs of decreased red blood cells - unusually weak or tired, feeling faint or lightheaded, falls signs of infection - fever or chills, cough, sore throat, pain or difficulty passing urine signs and symptoms of liver injury like dark yellow or brown urine; general ill feeling or flu-like symptoms; light-colored stools; loss of appetite; nausea; right upper belly pain; unusually weak or tired; yellowing of the eyes or skin swelling of the ankles, feet, hands unusually slow heartbeat Side effects that usually do not require medical attention (report to yourdoctor or health care professional if they continue or are bothersome): diarrhea hair loss loss of appetite nausea, vomiting tiredness This list may not describe all possible side effects. Call your doctor for medical advice about side effects. You may report side effects to FDA at1-800-FDA-1088. Where should I keep my medication? This drug is given in a hospital or clinic and will not be stored at home. NOTE: This sheet is a summary. It may not cover all possible information. If you have questions about this medicine, talk to your doctor, pharmacist, orhealth care provider.  2022 Elsevier/Gold Standard (2016-11-05 13:03:45)  Gemcitabine injection What is this medication? GEMCITABINE (jem  SYE ta been) is a chemotherapy drug. This medicine is used to treat many types of cancer like breast cancer, lung cancer, pancreatic cancer,and ovarian cancer. This medicine may be used for other purposes; ask your health care provider orpharmacist if you have questions. COMMON BRAND NAME(S): Gemzar, Infugem What should I tell my care team before I take this medication? They need to know if you have any of these conditions: blood disorders infection kidney disease liver disease lung or breathing disease, like asthma recent or ongoing radiation therapy an unusual or allergic reaction to gemcitabine, other chemotherapy, other medicines, foods, dyes, or preservatives pregnant or trying to get pregnant breast-feeding How should I use this medication? This drug is given as an infusion into a vein. It is administered in a hospitalor clinic by a specially trained health care professional. Talk to your pediatrician regarding the use of this medicine in children.Special care may be needed. Overdosage: If you think you have taken too much of this medicine contact apoison control center or emergency room at once. NOTE: This medicine is only for you. Do not share this medicine with others. What if I miss a dose? It is important not to miss your dose. Call your doctor or health careprofessional if you are unable to keep an appointment. What may interact with this medication? medicines  to increase blood counts like filgrastim, pegfilgrastim, sargramostim some other chemotherapy drugs like cisplatin vaccines Talk to your doctor or health care professional before taking any of thesemedicines: acetaminophen aspirin ibuprofen ketoprofen naproxen This list may not describe all possible interactions. Give your health care provider a list of all the medicines, herbs, non-prescription drugs, or dietary supplements you use. Also tell them if you smoke, drink alcohol, or use illegaldrugs. Some items may  interact with your medicine. What should I watch for while using this medication? Visit your doctor for checks on your progress. This drug may make you feel generally unwell. This is not uncommon, as chemotherapy can affect healthy cells as well as cancer cells. Report any side effects. Continue your course oftreatment even though you feel ill unless your doctor tells you to stop. In some cases, you may be given additional medicines to help with side effects.Follow all directions for their use. Call your doctor or health care professional for advice if you get a fever, chills or sore throat, or other symptoms of a cold or flu. Do not treat yourself. This drug decreases your body's ability to fight infections. Try toavoid being around people who are sick. This medicine may increase your risk to bruise or bleed. Call your doctor orhealth care professional if you notice any unusual bleeding. Be careful brushing and flossing your teeth or using a toothpick because you may get an infection or bleed more easily. If you have any dental work done,tell your dentist you are receiving this medicine. Avoid taking products that contain aspirin, acetaminophen, ibuprofen, naproxen, or ketoprofen unless instructed by your doctor. These medicines may hide afever. Do not become pregnant while taking this medicine or for 6 months after stopping it. Women should inform their doctor if they wish to become pregnant or think they might be pregnant. Men should not father a child while taking this medicine and for 3 months after stopping it. There is a potential for serious side effects to an unborn child. Talk to your health care professional or pharmacist for more information. Do not breast-feed an infant while takingthis medicine or for at least 1 week after stopping it. Men should inform their doctors if they wish to father a child. This medicine may lower sperm counts. Talk with your doctor or health care professional ifyou  are concerned about your fertility. What side effects may I notice from receiving this medication? Side effects that you should report to your doctor or health care professionalas soon as possible: allergic reactions like skin rash, itching or hives, swelling of the face, lips, or tongue breathing problems pain, redness, or irritation at site where injected signs and symptoms of a dangerous change in heartbeat or heart rhythm like chest pain; dizziness; fast or irregular heartbeat; palpitations; feeling faint or lightheaded, falls; breathing problems signs of decreased platelets or bleeding - bruising, pinpoint red spots on the skin, black, tarry stools, blood in the urine signs of decreased red blood cells - unusually weak or tired, feeling faint or lightheaded, falls signs of infection - fever or chills, cough, sore throat, pain or difficulty passing urine signs and symptoms of kidney injury like trouble passing urine or change in the amount of urine signs and symptoms of liver injury like dark yellow or brown urine; general ill feeling or flu-like symptoms; light-colored stools; loss of appetite; nausea; right upper belly pain; unusually weak or tired; yellowing of the eyes or skin swelling of ankles, feet, hands Side effects that usually  do not require medical attention (report to yourdoctor or health care professional if they continue or are bothersome): constipation diarrhea hair loss loss of appetite nausea rash vomiting This list may not describe all possible side effects. Call your doctor for medical advice about side effects. You may report side effects to FDA at1-800-FDA-1088. Where should I keep my medication? This drug is given in a hospital or clinic and will not be stored at home. NOTE: This sheet is a summary. It may not cover all possible information. If you have questions about this medicine, talk to your doctor, pharmacist, orhealth care provider.  2022 Elsevier/Gold  Standard (2017-05-28 18:06:11)  Rehydration, Adult Rehydration is the replacement of body fluids, salts, and minerals (electrolytes) that are lost during dehydration. Dehydration is when there is not enough water or other fluids in the body. This happens when you lose more fluids than you take in. Common causes of dehydration include: Not drinking enough fluids. This can occur when you are ill or doing activities that require a lot of energy, especially in hot weather. Conditions that cause loss of water or other fluids, such as diarrhea, vomiting, sweating, or urinating a lot. Other illnesses, such as fever or infection. Certain medicines, such as those that remove excess fluid from the body (diuretics). Symptoms of mild or moderate dehydration may include thirst, dry lips and mouth, and dizziness. Symptoms of severe dehydration may include increasedheart rate, confusion, fainting, and not urinating. For severe dehydration, you may need to get fluids through an IV at the hospital. For mild or moderate dehydration, you can usually rehydrate at homeby drinking certain fluids as told by your health care provider. What are the risks? Generally, rehydration is safe. However, taking in too much fluid (overhydration) can be a problem. This is rare. Overhydration can cause an electrolyte imbalance, kidney failure, or a decrease in salt (sodium) levels in the body. Supplies needed You will need an oral rehydration solution (ORS) if your health care provider tells you to use one. This is a drink to treat dehydration. It can be found inpharmacies and retail stores. How to rehydrate Fluids Follow instructions from your health care provider for rehydration. The kind of fluid and the amount you should drink depend on your condition. In general, you should choose drinks that you prefer. If told by your health care provider, drink an ORS. Make an ORS by following instructions on the package. Start by drinking  small amounts, about  cup (120 mL) every 5-10 minutes. Slowly increase how much you drink until you have taken the amount recommended by your health care provider. Drink enough clear fluids to keep your urine pale yellow. If you were told to drink an ORS, finish it first, then start slowly drinking other clear fluids. Drink fluids such as: Water. This includes sparkling water and flavored water. Drinking only water can lead to having too little sodium in your body (hyponatremia). Follow the advice of your health care provider. Water from ice chips you suck on. Fruit juice with water you add to it (diluted). Sports drinks. Hot or cold herbal teas. Broth-based soups. Milk or milk products. Food Follow instructions from your health care provider about what to eat while you rehydrate. Your health care provider may recommend that you slowly begin eating regular foods in small amounts. Eat foods that contain a healthy balance of electrolytes, such as bananas, oranges, potatoes, tomatoes, and spinach. Avoid foods that are greasy or contain a lot of sugar. In some  cases, you may get nutrition through a feeding tube that is passed through your nose and into your stomach (nasogastric tube, or NG tube). This may be done if you have uncontrolled vomiting or diarrhea. Beverages to avoid  Certain beverages may make dehydration worse. While you rehydrate, avoiddrinking alcohol. How to tell if you are recovering from dehydration You may be recovering from dehydration if: You are urinating more often than before you started rehydrating. Your urine is pale yellow. Your energy level improves. You vomit less frequently. You have diarrhea less frequently. Your appetite improves or returns to normal. You feel less dizzy or less light-headed. Your skin tone and color start to look more normal. Follow these instructions at home: Take over-the-counter and prescription medicines only as told by your health care  provider. Do not take sodium tablets. Doing this can lead to having too much sodium in your body (hypernatremia). Contact a health care provider if: You continue to have symptoms of mild or moderate dehydration, such as: Thirst. Dry lips. Slightly dry mouth. Dizziness. Dark urine or less urine than normal. Muscle cramps. You continue to vomit or have diarrhea. Get help right away if you: Have symptoms of dehydration that get worse. Have a fever. Have a severe headache. Have been vomiting and the following happens: Your vomiting gets worse or does not go away. Your vomit includes blood or green matter (bile). You cannot eat or drink without vomiting. Have problems with urination or bowel movements, such as: Diarrhea that gets worse or does not go away. Blood in your stool (feces). This may cause stool to look black and tarry. Not urinating, or urinating only a small amount of very dark urine, within 6-8 hours. Have trouble breathing. Have symptoms that get worse with treatment. These symptoms may represent a serious problem that is an emergency. Do not wait to see if the symptoms will go away. Get medical help right away. Call your local emergency services (911 in the [place] Do not drive yourself to the hospital. Summary Rehydration is the replacement of body fluids and minerals (electrolytes) that are lost during dehydration. Follow instructions from your health care provider for rehydration. The kind of fluid and amount you should drink depend on your condition. Slowly increase how much you drink until you have taken the amount recommended by your health care provider. Contact your health care provider if you continue to show signs of mild or moderate dehydration. This information is not intended to replace advice given to you by your health care provider. Make sure you discuss any questions you have with your healthcare provider. Document Revised: 05/05/2019 Document Reviewed:  03/15/2019 Elsevier Patient Education  2022 [employer].

## 2020-11-13 DIAGNOSIS — G893 Neoplasm related pain (acute) (chronic): Secondary | ICD-10-CM | POA: Diagnosis not present

## 2020-11-13 DIAGNOSIS — M159 Polyosteoarthritis, unspecified: Secondary | ICD-10-CM | POA: Diagnosis not present

## 2020-11-13 DIAGNOSIS — E119 Type 2 diabetes mellitus without complications: Secondary | ICD-10-CM | POA: Diagnosis not present

## 2020-11-13 DIAGNOSIS — C787 Secondary malignant neoplasm of liver and intrahepatic bile duct: Secondary | ICD-10-CM | POA: Diagnosis not present

## 2020-11-13 DIAGNOSIS — E039 Hypothyroidism, unspecified: Secondary | ICD-10-CM | POA: Diagnosis not present

## 2020-11-13 DIAGNOSIS — I10 Essential (primary) hypertension: Secondary | ICD-10-CM | POA: Diagnosis not present

## 2020-11-13 DIAGNOSIS — E782 Mixed hyperlipidemia: Secondary | ICD-10-CM | POA: Diagnosis not present

## 2020-11-13 DIAGNOSIS — C7800 Secondary malignant neoplasm of unspecified lung: Secondary | ICD-10-CM | POA: Diagnosis not present

## 2020-11-13 DIAGNOSIS — C251 Malignant neoplasm of body of pancreas: Secondary | ICD-10-CM | POA: Diagnosis not present

## 2020-11-14 DIAGNOSIS — M159 Polyosteoarthritis, unspecified: Secondary | ICD-10-CM | POA: Diagnosis not present

## 2020-11-14 DIAGNOSIS — G893 Neoplasm related pain (acute) (chronic): Secondary | ICD-10-CM | POA: Diagnosis not present

## 2020-11-14 DIAGNOSIS — E782 Mixed hyperlipidemia: Secondary | ICD-10-CM | POA: Diagnosis not present

## 2020-11-14 DIAGNOSIS — E039 Hypothyroidism, unspecified: Secondary | ICD-10-CM | POA: Diagnosis not present

## 2020-11-14 DIAGNOSIS — E119 Type 2 diabetes mellitus without complications: Secondary | ICD-10-CM | POA: Diagnosis not present

## 2020-11-14 DIAGNOSIS — C787 Secondary malignant neoplasm of liver and intrahepatic bile duct: Secondary | ICD-10-CM | POA: Diagnosis not present

## 2020-11-14 DIAGNOSIS — C7800 Secondary malignant neoplasm of unspecified lung: Secondary | ICD-10-CM | POA: Diagnosis not present

## 2020-11-14 DIAGNOSIS — C251 Malignant neoplasm of body of pancreas: Secondary | ICD-10-CM | POA: Diagnosis not present

## 2020-11-14 DIAGNOSIS — I10 Essential (primary) hypertension: Secondary | ICD-10-CM | POA: Diagnosis not present

## 2020-11-16 ENCOUNTER — Other Ambulatory Visit: Payer: Medicare HMO

## 2020-11-16 ENCOUNTER — Ambulatory Visit: Payer: Medicare HMO | Admitting: Oncology

## 2020-11-17 ENCOUNTER — Ambulatory Visit: Payer: Medicare HMO

## 2020-11-20 ENCOUNTER — Other Ambulatory Visit: Payer: Self-pay | Admitting: Oncology

## 2020-11-22 DIAGNOSIS — E039 Hypothyroidism, unspecified: Secondary | ICD-10-CM | POA: Diagnosis not present

## 2020-11-22 DIAGNOSIS — M159 Polyosteoarthritis, unspecified: Secondary | ICD-10-CM | POA: Diagnosis not present

## 2020-11-22 DIAGNOSIS — I10 Essential (primary) hypertension: Secondary | ICD-10-CM | POA: Diagnosis not present

## 2020-11-22 DIAGNOSIS — C787 Secondary malignant neoplasm of liver and intrahepatic bile duct: Secondary | ICD-10-CM | POA: Diagnosis not present

## 2020-11-22 DIAGNOSIS — E782 Mixed hyperlipidemia: Secondary | ICD-10-CM | POA: Diagnosis not present

## 2020-11-22 DIAGNOSIS — G893 Neoplasm related pain (acute) (chronic): Secondary | ICD-10-CM | POA: Diagnosis not present

## 2020-11-22 DIAGNOSIS — C7800 Secondary malignant neoplasm of unspecified lung: Secondary | ICD-10-CM | POA: Diagnosis not present

## 2020-11-22 DIAGNOSIS — C251 Malignant neoplasm of body of pancreas: Secondary | ICD-10-CM | POA: Diagnosis not present

## 2020-11-22 DIAGNOSIS — E119 Type 2 diabetes mellitus without complications: Secondary | ICD-10-CM | POA: Diagnosis not present

## 2020-11-23 ENCOUNTER — Telehealth: Payer: Self-pay

## 2020-11-23 ENCOUNTER — Inpatient Hospital Stay (HOSPITAL_BASED_OUTPATIENT_CLINIC_OR_DEPARTMENT_OTHER): Payer: Medicare HMO | Admitting: Nurse Practitioner

## 2020-11-23 ENCOUNTER — Inpatient Hospital Stay: Payer: Medicare HMO

## 2020-11-23 ENCOUNTER — Other Ambulatory Visit: Payer: Self-pay

## 2020-11-23 ENCOUNTER — Encounter: Payer: Self-pay | Admitting: Nurse Practitioner

## 2020-11-23 ENCOUNTER — Inpatient Hospital Stay: Payer: Medicare HMO | Admitting: Oncology

## 2020-11-23 ENCOUNTER — Inpatient Hospital Stay: Payer: Medicare HMO | Attending: Hematology

## 2020-11-23 VITALS — BP 153/84 | HR 96 | Temp 97.7°F | Resp 18 | Ht 65.0 in | Wt 158.0 lb

## 2020-11-23 DIAGNOSIS — M1990 Unspecified osteoarthritis, unspecified site: Secondary | ICD-10-CM | POA: Diagnosis not present

## 2020-11-23 DIAGNOSIS — C259 Malignant neoplasm of pancreas, unspecified: Secondary | ICD-10-CM | POA: Diagnosis not present

## 2020-11-23 DIAGNOSIS — R300 Dysuria: Secondary | ICD-10-CM | POA: Insufficient documentation

## 2020-11-23 DIAGNOSIS — E119 Type 2 diabetes mellitus without complications: Secondary | ICD-10-CM | POA: Diagnosis not present

## 2020-11-23 DIAGNOSIS — G893 Neoplasm related pain (acute) (chronic): Secondary | ICD-10-CM | POA: Diagnosis not present

## 2020-11-23 DIAGNOSIS — Z5111 Encounter for antineoplastic chemotherapy: Secondary | ICD-10-CM | POA: Diagnosis not present

## 2020-11-23 DIAGNOSIS — C250 Malignant neoplasm of head of pancreas: Secondary | ICD-10-CM | POA: Diagnosis not present

## 2020-11-23 DIAGNOSIS — I10 Essential (primary) hypertension: Secondary | ICD-10-CM | POA: Insufficient documentation

## 2020-11-23 LAB — CBC WITH DIFFERENTIAL (CANCER CENTER ONLY)
Abs Immature Granulocytes: 0.04 10*3/uL (ref 0.00–0.07)
Basophils Absolute: 0 10*3/uL (ref 0.0–0.1)
Basophils Relative: 1 %
Eosinophils Absolute: 0.1 10*3/uL (ref 0.0–0.5)
Eosinophils Relative: 1 %
HCT: 37.5 % (ref 36.0–46.0)
Hemoglobin: 12.5 g/dL (ref 12.0–15.0)
Immature Granulocytes: 1 %
Lymphocytes Relative: 29 %
Lymphs Abs: 1.6 10*3/uL (ref 0.7–4.0)
MCH: 31 pg (ref 26.0–34.0)
MCHC: 33.3 g/dL (ref 30.0–36.0)
MCV: 93.1 fL (ref 80.0–100.0)
Monocytes Absolute: 0.6 10*3/uL (ref 0.1–1.0)
Monocytes Relative: 11 %
Neutro Abs: 3.2 10*3/uL (ref 1.7–7.7)
Neutrophils Relative %: 57 %
Platelet Count: 132 10*3/uL — ABNORMAL LOW (ref 150–400)
RBC: 4.03 MIL/uL (ref 3.87–5.11)
RDW: 17 % — ABNORMAL HIGH (ref 11.5–15.5)
WBC Count: 5.6 10*3/uL (ref 4.0–10.5)
nRBC: 0 % (ref 0.0–0.2)

## 2020-11-23 LAB — CMP (CANCER CENTER ONLY)
ALT: 28 U/L (ref 0–44)
AST: 22 U/L (ref 15–41)
Albumin: 4.1 g/dL (ref 3.5–5.0)
Alkaline Phosphatase: 76 U/L (ref 38–126)
Anion gap: 11 (ref 5–15)
BUN: 11 mg/dL (ref 8–23)
CO2: 22 mmol/L (ref 22–32)
Calcium: 9 mg/dL (ref 8.9–10.3)
Chloride: 104 mmol/L (ref 98–111)
Creatinine: 0.85 mg/dL (ref 0.44–1.00)
GFR, Estimated: >60 mL/min (ref >60)
Glucose, Bld: 154 mg/dL — ABNORMAL HIGH (ref 70–99)
Potassium: 3.7 mmol/L (ref 3.5–5.1)
Sodium: 137 mmol/L (ref 135–145)
Total Bilirubin: 0.8 mg/dL (ref 0.3–1.2)
Total Protein: 6.6 g/dL (ref 6.5–8.1)

## 2020-11-23 MED ORDER — ALPRAZOLAM 0.5 MG PO TABS: 0.5000 mg | ORAL_TABLET | Freq: Every day | ORAL | 2 refills | Status: DC | PRN

## 2020-11-23 NOTE — Progress Notes (Signed)
  [facility] OFFICE PROGRESS NOTE   Diagnosis: Pancreas cancer  INTERVAL HISTORY:   [name] returns as scheduled.  She completed cycle 3 gemcitabine/Abraxane 11/09/2020.  She denies nausea/vomiting.  No mouth sores.  No diarrhea.  No fever or rash after treatment.  She has intermittent tingling various areas of her body.  She denies pain.  She has a good appetite.  She notes blood with nose blowing periodically.  No other bleeding.  Objective:  Vital signs in last 24 hours:  Blood pressure (!) 153/84, pulse 96, temperature 97.7 F (36.5 C), temperature source Oral, resp. rate 18, height '5\' 5"'$  (1.651 m), weight 158 lb (71.7 kg), SpO2 100 %.    HEENT: No thrush or ulcers.  Yellow cystic-appearing lesion at the left pharynx. Resp: Lungs clear bilaterally. Cardio: Regular rate and rhythm. GI: Abdomen soft and nontender.  No hepatosplenomegaly.  No mass. Vascular: No leg edema. Port-A-Cath without erythema.  Lab Results:  Lab Results  Component Value Date   WBC 5.6 11/23/2020   HGB 12.5 11/23/2020   HCT 37.5 11/23/2020   MCV 93.1 11/23/2020   PLT 132 (L) 11/23/2020   NEUTROABS 3.2 11/23/2020    Imaging:  No results found.  Medications: I have reviewed the patient's current medications.  Assessment/Plan: Adenocarcinoma the pancreas body, stage IV (cT4,cN0,pM1) Ultrasound abdomen 08/18/2020-possible hypoechoic pancreas body mass, small hypoechoic liver areas MRI abdomen 08/27/2020-pancreas body mass, multiple hepatic lesions consistent with metastases, right abdominal omental nodularity, pancreas mass extends to the celiac bifurcation and abuts the splenic vein and splenoportal confluence CT chest 09/07/2020-scattered pulmonary nodules concerning for metastases, pancreas body mass, hepatic metastases Ultrasound-guided biopsy of a right liver lesion 09/08/2020-adenocarcinoma consistent with a pancreas primary CT abdomen/pelvis 09/24/2020-pancreas neck mass,  omental and peritoneal nodularity-unchanged, multiple hypoenhancing liver masses-unchanged, numerous small pulmonary nodules Cycle 1 gemcitabine/Abraxane 10/04/2020 Cycle 2 gemcitabine/Abraxane 10/27/2020 Cycle 3 gemcitabine/Abraxane 11/11/2018 Cycle 4 gemcitabine/Abraxane 11/23/2020   2.  Pain secondary #1 3.  Diabetes 4.  Hypertension 5.  Osteoarthritis 6.  Port-A-Cath placement 09/22/2020 7.  Admission with jaundice 09/24/2020.  MRI-new abrupt constriction of the common hepatic duct.  The infiltrative pancreatic mass extends medially in the porta hepatis to obstruct the common hepatic duct.  Multiple right hepatic lobe metastases mildly increased in size.  Stent placed into the common bile duct 09/28/2020.      Disposition: [name] appears stable.  Performance status continues to be improved.  She has completed 3 cycles of gemcitabine/Abraxane and overall seems to be tolerating the chemotherapy well.  Plan to proceed with cycle 4 11/24/2020 as scheduled.  We reviewed the CBC and chemistry panel from today.  Labs adequate to proceed as above.  She will return for lab, follow-up, chemotherapy in 2 weeks.  We are available to see her sooner if needed.    Ned Card ANP/GNP-BC   11/23/2020  2:20 PM

## 2020-11-23 NOTE — Telephone Encounter (Signed)
Pt's daughter called stating that someone from our office called [name] today. I do not see any documentation. She then stated that we should know who called and nothing gets done in a timely manner. I informed her that I will send a message back. She stated that her mother's B12 appts are not figured out and she needs to speak to [facility]. I told her that Lynelle Smoke is unavailable and I will send the message.

## 2020-11-23 NOTE — Telephone Encounter (Signed)
Please advise. Last b12 lab states to wait 4 weeks to get next injection due to levels being too high patient did so. What should her b12 schedule be, does she need levels checked again?

## 2020-11-24 ENCOUNTER — Inpatient Hospital Stay: Payer: Medicare HMO

## 2020-11-24 VITALS — BP 124/76 | HR 87 | Temp 98.9°F | Resp 18

## 2020-11-24 DIAGNOSIS — M1990 Unspecified osteoarthritis, unspecified site: Secondary | ICD-10-CM | POA: Diagnosis not present

## 2020-11-24 DIAGNOSIS — Z5111 Encounter for antineoplastic chemotherapy: Secondary | ICD-10-CM | POA: Diagnosis not present

## 2020-11-24 DIAGNOSIS — C250 Malignant neoplasm of head of pancreas: Secondary | ICD-10-CM | POA: Diagnosis not present

## 2020-11-24 DIAGNOSIS — G893 Neoplasm related pain (acute) (chronic): Secondary | ICD-10-CM | POA: Diagnosis not present

## 2020-11-24 DIAGNOSIS — I10 Essential (primary) hypertension: Secondary | ICD-10-CM | POA: Diagnosis not present

## 2020-11-24 DIAGNOSIS — R300 Dysuria: Secondary | ICD-10-CM | POA: Diagnosis not present

## 2020-11-24 DIAGNOSIS — C259 Malignant neoplasm of pancreas, unspecified: Secondary | ICD-10-CM

## 2020-11-24 DIAGNOSIS — E119 Type 2 diabetes mellitus without complications: Secondary | ICD-10-CM | POA: Diagnosis not present

## 2020-11-24 LAB — CANCER ANTIGEN 19-9: CA 19-9: 4648 U/mL — ABNORMAL HIGH (ref 0–35)

## 2020-11-24 MED ORDER — SODIUM CHLORIDE 0.9 % IV SOLN
800.0000 mg/m2 | Freq: Once | INTRAVENOUS | Status: AC
  Administered 2020-11-24: 1482 mg via INTRAVENOUS
  Filled 2020-11-24: qty 15.78

## 2020-11-24 MED ORDER — HEPARIN SOD (PORK) LOCK FLUSH 100 UNIT/ML IV SOLN
500.0000 [IU] | Freq: Once | INTRAVENOUS | Status: AC | PRN
  Administered 2020-11-24: 500 [IU]

## 2020-11-24 MED ORDER — PROCHLORPERAZINE MALEATE 10 MG PO TABS
10.0000 mg | ORAL_TABLET | Freq: Once | ORAL | Status: AC
  Administered 2020-11-24: 10 mg via ORAL
  Filled 2020-11-24: qty 1

## 2020-11-24 MED ORDER — SODIUM CHLORIDE 0.9 % IV SOLN: Freq: Once | INTRAVENOUS | Status: AC

## 2020-11-24 MED ORDER — SODIUM CHLORIDE 0.9% FLUSH
10.0000 mL | INTRAVENOUS | Status: DC | PRN
  Administered 2020-11-24: 10 mL

## 2020-11-24 MED ORDER — SODIUM CHLORIDE 0.9 % IV SOLN: INTRAVENOUS | Status: DC

## 2020-11-24 MED ORDER — PACLITAXEL PROTEIN-BOUND CHEMO INJECTION 100 MG
100.0000 mg/m2 | Freq: Once | INTRAVENOUS | Status: AC
  Administered 2020-11-24: 175 mg via INTRAVENOUS
  Filled 2020-11-24: qty 35

## 2020-11-24 MED ORDER — SODIUM CHLORIDE 0.9 % IV SOLN: INTRAVENOUS | Status: AC

## 2020-11-24 NOTE — Telephone Encounter (Signed)
Spoke with patient regarding b12 injections, gave a verbal injection and got patient scheduled for next b12 injection.

## 2020-11-24 NOTE — Progress Notes (Signed)
Patient presents for treatment. RN assessment completed along with the following:  Treatment Conditions (labs/vitals/weight) reviewed - Yes, and within treatment parameters. Oncology Treatment Attestation completed for current therapy- Yes, on date 6/ Informed consent completed and reflects current therapy/intent - Yes, on date 10/12/2020 Provider progress note reviewed - Patient not seen by provider today. Most recent note dated yes reviewed. Treatment/Antibody/Supportive plan/other orders reviewed - Yes, and there are no adjustments needed for today's treatment. Previous treatment date reviewed - Yes, and the appropriate amount of time has elapsed between treatments. Clinic Hand Off Received from - no  Patient to proceed with treatment.

## 2020-11-24 NOTE — Patient Instructions (Addendum)
[name]  Discharge Instructions: Thank you for choosing [facility] to provide your oncology and hematology care.   If you have a lab appointment with the [facility], please go directly to the [facility] and check in at the registration area.   Wear comfortable clothing and clothing appropriate for easy access to any Portacath or PICC line.   We strive to give you quality time with your provider. You may need to reschedule your appointment if you arrive late (15 or more minutes).  Arriving late affects you and other patients whose appointments are after yours.  Also, if you miss three or more appointments without notifying the office, you may be dismissed from the clinic at the provider's discretion.      For prescription refill requests, have your pharmacy contact our office and allow 72 hours for refills to be completed.    Today you received the following chemotherapy and/or immunotherapy agents  Gemzar, Abraxane   To help prevent nausea and vomiting after your treatment, we encourage you to take your nausea medication as directed.  BELOW ARE SYMPTOMS THAT SHOULD BE REPORTED IMMEDIATELY: *FEVER GREATER THAN 100.4 F (38 C) OR HIGHER *CHILLS OR SWEATING *NAUSEA AND VOMITING THAT IS NOT CONTROLLED WITH YOUR NAUSEA MEDICATION *UNUSUAL SHORTNESS OF BREATH *UNUSUAL BRUISING OR BLEEDING *URINARY PROBLEMS (pain or burning when urinating, or frequent urination) *BOWEL PROBLEMS (unusual diarrhea, constipation, pain near the anus) TENDERNESS IN MOUTH AND THROAT WITH OR WITHOUT PRESENCE OF ULCERS (sore throat, sores in mouth, or a toothache) UNUSUAL RASH, SWELLING OR PAIN  UNUSUAL VAGINAL DISCHARGE OR ITCHING   Items with * indicate a potential emergency and should be followed up as soon as possible or go to the Emergency Department if any problems should occur.  Please show the CHEMOTHERAPY ALERT CARD or IMMUNOTHERAPY ALERT CARD at check-in to  the Emergency Department and triage nurse.  Should you have questions after your visit or need to cancel or reschedule your appointment, please contact [facility]  Dept: [phone]  and follow the prompts.  Office hours are 8:00 a.m. to 4:30 p.m. Monday - Friday. Please note that voicemails left after 4:00 p.m. may not be returned until the following business day.  We are closed weekends and major holidays. You have access to a nurse at all times for urgent questions. Please call the main number to the clinic Dept: [phone] and follow the prompts.   For any non-urgent questions, you may also contact your provider using MyChart. We now offer e-Visits for anyone 77 and older to request care online for non-urgent symptoms. For details visit [email or web address].   Also download the MyChart app! Go to the app store, search "MyChart", open the app, select [facility], and log in with your MyChart username and password.  Due to Covid, a mask is required upon entering the hospital/clinic. If you do not have a mask, one will be given to you upon arrival. For doctor visits, patients may have 1 support person aged 56 or older with them. For treatment visits, patients cannot have anyone with them due to current Covid guidelines and our immunocompromised population.   Gemcitabine injection What is this medication? GEMCITABINE (jem SYE ta been) is a chemotherapy drug. This medicine is used to treat many types of cancer like breast cancer, lung cancer, pancreatic cancer, and ovarian cancer. This medicine may be used for other purposes; ask your health care provider or pharmacist  if you have questions. COMMON BRAND NAME(S): Gemzar, Infugem What should I tell my care team before I take this medication? They need to know if you have any of these conditions: blood disorders infection kidney disease liver disease lung or breathing disease, like asthma recent or ongoing  radiation therapy an unusual or allergic reaction to gemcitabine, other chemotherapy, other medicines, foods, dyes, or preservatives pregnant or trying to get pregnant breast-feeding How should I use this medication? This drug is given as an infusion into a vein. It is administered in a hospital or clinic by a specially trained health care professional. Talk to your pediatrician regarding the use of this medicine in children. Special care may be needed. Overdosage: If you think you have taken too much of this medicine contact a poison control center or emergency room at once. NOTE: This medicine is only for you. Do not share this medicine with others. What if I miss a dose? It is important not to miss your dose. Call your doctor or health care professional if you are unable to keep an appointment. What may interact with this medication? medicines to increase blood counts like filgrastim, pegfilgrastim, sargramostim some other chemotherapy drugs like cisplatin vaccines Talk to your doctor or health care professional before taking any of these medicines: acetaminophen aspirin ibuprofen ketoprofen naproxen This list may not describe all possible interactions. Give your health care provider a list of all the medicines, herbs, non-prescription drugs, or dietary supplements you use. Also tell them if you smoke, drink alcohol, or use illegal drugs. Some items may interact with your medicine. What should I watch for while using this medication? Visit your doctor for checks on your progress. This drug may make you feel generally unwell. This is not uncommon, as chemotherapy can affect healthy cells as well as cancer cells. Report any side effects. Continue your course of treatment even though you feel ill unless your doctor tells you to stop. In some cases, you may be given additional medicines to help with side effects. Follow all directions for their use. Call your doctor or health care  professional for advice if you get a fever, chills or sore throat, or other symptoms of a cold or flu. Do not treat yourself. This drug decreases your body's ability to fight infections. Try to avoid being around people who are sick. This medicine may increase your risk to bruise or bleed. Call your doctor or health care professional if you notice any unusual bleeding. Be careful brushing and flossing your teeth or using a toothpick because you may get an infection or bleed more easily. If you have any dental work done, tell your dentist you are receiving this medicine. Avoid taking products that contain aspirin, acetaminophen, ibuprofen, naproxen, or ketoprofen unless instructed by your doctor. These medicines may hide a fever. Do not become pregnant while taking this medicine or for 6 months after stopping it. Women should inform their doctor if they wish to become pregnant or think they might be pregnant. Men should not father a child while taking this medicine and for 3 months after stopping it. There is a potential for serious side effects to an unborn child. Talk to your health care professional or pharmacist for more information. Do not breast-feed an infant while taking this medicine or for at least 1 week after stopping it. Men should inform their doctors if they wish to father a child. This medicine may lower sperm counts. Talk with your doctor or health care professional  if you are concerned about your fertility. What side effects may I notice from receiving this medication? Side effects that you should report to your doctor or health care professional as soon as possible: allergic reactions like skin rash, itching or hives, swelling of the face, lips, or tongue breathing problems pain, redness, or irritation at site where injected signs and symptoms of a dangerous change in heartbeat or heart rhythm like chest pain; dizziness; fast or irregular heartbeat; palpitations; feeling faint or  lightheaded, falls; breathing problems signs of decreased platelets or bleeding - bruising, pinpoint red spots on the skin, black, tarry stools, blood in the urine signs of decreased red blood cells - unusually weak or tired, feeling faint or lightheaded, falls signs of infection - fever or chills, cough, sore throat, pain or difficulty passing urine signs and symptoms of kidney injury like trouble passing urine or change in the amount of urine signs and symptoms of liver injury like dark yellow or brown urine; general ill feeling or flu-like symptoms; light-colored stools; loss of appetite; nausea; right upper belly pain; unusually weak or tired; yellowing of the eyes or skin swelling of ankles, feet, hands Side effects that usually do not require medical attention (report to your doctor or health care professional if they continue or are bothersome): constipation diarrhea hair loss loss of appetite nausea rash vomiting This list may not describe all possible side effects. Call your doctor for medical advice about side effects. You may report side effects to FDA at 1-800-FDA-1088. Where should I keep my medication? This drug is given in a hospital or clinic and will not be stored at home. NOTE: This sheet is a summary. It may not cover all possible information. If you have questions about this medicine, talk to your doctor, pharmacist, or health care provider.  2022 Elsevier/Gold Standard (2017-05-28 18:06:11)  Nanoparticle Albumin-Bound Paclitaxel injection What is this medication? NANOPARTICLE ALBUMIN-BOUND PACLITAXEL (Na no PAHR ti kuhl al BYOO muhn-bound PAK li TAX el) is a chemotherapy drug. It targets fast dividing cells, like cancer cells, and causes these cells to die. This medicine is used to treat advanced breast cancer, lung cancer, and pancreatic cancer. This medicine may be used for other purposes; ask your health care provider or pharmacist if you have questions. COMMON BRAND  NAME(S): Abraxane What should I tell my care team before I take this medication? They need to know if you have any of these conditions: kidney disease liver disease low blood counts, like low white cell, platelet, or red cell counts lung or breathing disease, like asthma tingling of the fingers or toes, or other nerve disorder an unusual or allergic reaction to paclitaxel, albumin, other chemotherapy, other medicines, foods, dyes, or preservatives pregnant or trying to get pregnant breast-feeding How should I use this medication? This drug is given as an infusion into a vein. It is administered in a hospital or clinic by a specially trained health care professional. Talk to your pediatrician regarding the use of this medicine in children. Special care may be needed. Overdosage: If you think you have taken too much of this medicine contact a poison control center or emergency room at once. NOTE: This medicine is only for you. Do not share this medicine with others. What if I miss a dose? It is important not to miss your dose. Call your doctor or health care professional if you are unable to keep an appointment. What may interact with this medication? This medicine may interact with the  following medications: antiviral medicines for hepatitis, HIV or AIDS certain antibiotics like erythromycin and clarithromycin certain medicines for fungal infections like ketoconazole and itraconazole certain medicines for seizures like carbamazepine, phenobarbital, phenytoin gemfibrozil nefazodone rifampin St. John's wort This list may not describe all possible interactions. Give your health care provider a list of all the medicines, herbs, non-prescription drugs, or dietary supplements you use. Also tell them if you smoke, drink alcohol, or use illegal drugs. Some items may interact with your medicine. What should I watch for while using this medication? Your condition will be monitored carefully while  you are receiving this medicine. You will need important blood work done while you are taking this medicine. This medicine can cause serious allergic reactions. If you experience allergic reactions like skin rash, itching or hives, swelling of the face, lips, or tongue, tell your doctor or health care professional right away. In some cases, you may be given additional medicines to help with side effects. Follow all directions for their use. This drug may make you feel generally unwell. This is not uncommon, as chemotherapy can affect healthy cells as well as cancer cells. Report any side effects. Continue your course of treatment even though you feel ill unless your doctor tells you to stop. Call your doctor or health care professional for advice if you get a fever, chills or sore throat, or other symptoms of a cold or flu. Do not treat yourself. This drug decreases your body's ability to fight infections. Try to avoid being around people who are sick. This medicine may increase your risk to bruise or bleed. Call your doctor or health care professional if you notice any unusual bleeding. Be careful brushing and flossing your teeth or using a toothpick because you may get an infection or bleed more easily. If you have any dental work done, tell your dentist you are receiving this medicine. Avoid taking products that contain aspirin, acetaminophen, ibuprofen, naproxen, or ketoprofen unless instructed by your doctor. These medicines may hide a fever. Do not become pregnant while taking this medicine or for 6 months after stopping it. Women should inform their doctor if they wish to become pregnant or think they might be pregnant. Men should not father a child while taking this medicine or for 3 months after stopping it. There is a potential for serious side effects to an unborn child. Talk to your health care professional or pharmacist for more information. Do not breast-feed an infant while taking this  medicine or for 2 weeks after stopping it. This medicine may interfere with the ability to get pregnant or to father a child. You should talk to your doctor or health care professional if you are concerned about your fertility. What side effects may I notice from receiving this medication? Side effects that you should report to your doctor or health care professional as soon as possible: allergic reactions like skin rash, itching or hives, swelling of the face, lips, or tongue breathing problems changes in vision fast, irregular heartbeat low blood pressure mouth sores pain, tingling, numbness in the hands or feet signs of decreased platelets or bleeding - bruising, pinpoint red spots on the skin, black, tarry stools, blood in the urine signs of decreased red blood cells - unusually weak or tired, feeling faint or lightheaded, falls signs of infection - fever or chills, cough, sore throat, pain or difficulty passing urine signs and symptoms of liver injury like dark yellow or brown urine; general ill feeling or flu-like symptoms;  light-colored stools; loss of appetite; nausea; right upper belly pain; unusually weak or tired; yellowing of the eyes or skin swelling of the ankles, feet, hands unusually slow heartbeat Side effects that usually do not require medical attention (report to your doctor or health care professional if they continue or are bothersome): diarrhea hair loss loss of appetite nausea, vomiting tiredness This list may not describe all possible side effects. Call your doctor for medical advice about side effects. You may report side effects to FDA at 1-800-FDA-1088. Where should I keep my medication? This drug is given in a hospital or clinic and will not be stored at home. NOTE: This sheet is a summary. It may not cover all possible information. If you have questions about this medicine, talk to your doctor, pharmacist, or health care provider.  2022 Elsevier/Gold Standard  (2016-11-05 13:03:45)  Rehydration, Adult Rehydration is the replacement of body fluids, salts, and minerals (electrolytes) that are lost during dehydration. Dehydration is when there is not enough water or other fluids in the body. This happens when you lose more fluids than you take in. Common causes of dehydration include: Not drinking enough fluids. This can occur when you are ill or doing activities that require a lot of energy, especially in hot weather. Conditions that cause loss of water or other fluids, such as diarrhea, vomiting, sweating, or urinating a lot. Other illnesses, such as fever or infection. Certain medicines, such as those that remove excess fluid from the body (diuretics). Symptoms of mild or moderate dehydration may include thirst, dry lips and mouth, and dizziness. Symptoms of severe dehydration may include increased heart rate, confusion, fainting, and not urinating. For severe dehydration, you may need to get fluids through an IV at the hospital. For mild or moderate dehydration, you can usually rehydrate at home by drinking certain fluids as told by your health care provider. What are the risks? Generally, rehydration is safe. However, taking in too much fluid (overhydration) can be a problem. This is rare. Overhydration can cause an electrolyte imbalance, kidney failure, or a decrease in salt (sodium) levels in the body. Supplies needed You will need an oral rehydration solution (ORS) if your health care provider tells you to use one. This is a drink to treat dehydration. It can be found in pharmacies and retail stores. How to rehydrate Fluids Follow instructions from your health care provider for rehydration. The kind of fluid and the amount you should drink depend on your condition. In general, you should choose drinks that you prefer. If told by your health care provider, drink an ORS. Make an ORS by following instructions on the package. Start by drinking small  amounts, about  cup (120 mL) every 5-10 minutes. Slowly increase how much you drink until you have taken the amount recommended by your health care provider. Drink enough clear fluids to keep your urine pale yellow. If you were told to drink an ORS, finish it first, then start slowly drinking other clear fluids. Drink fluids such as: Water. This includes sparkling water and flavored water. Drinking only water can lead to having too little sodium in your body (hyponatremia). Follow the advice of your health care provider. Water from ice chips you suck on. Fruit juice with water you add to it (diluted). Sports drinks. Hot or cold herbal teas. Broth-based soups. Milk or milk products. Food Follow instructions from your health care provider about what to eat while you rehydrate. Your health care provider may recommend that you  slowly begin eating regular foods in small amounts. Eat foods that contain a healthy balance of electrolytes, such as bananas, oranges, potatoes, tomatoes, and spinach. Avoid foods that are greasy or contain a lot of sugar. In some cases, you may get nutrition through a feeding tube that is passed through your nose and into your stomach (nasogastric tube, or NG tube). This may be done if you have uncontrolled vomiting or diarrhea. Beverages to avoid Certain beverages may make dehydration worse. While you rehydrate, avoid drinking alcohol. How to tell if you are recovering from dehydration You may be recovering from dehydration if: You are urinating more often than before you started rehydrating. Your urine is pale yellow. Your energy level improves. You vomit less frequently. You have diarrhea less frequently. Your appetite improves or returns to normal. You feel less dizzy or less light-headed. Your skin tone and color start to look more normal. Follow these instructions at home: Take over-the-counter and prescription medicines only as told by your health care  provider. Do not take sodium tablets. Doing this can lead to having too much sodium in your body (hypernatremia). Contact a health care provider if: You continue to have symptoms of mild or moderate dehydration, such as: Thirst. Dry lips. Slightly dry mouth. Dizziness. Dark urine or less urine than normal. Muscle cramps. You continue to vomit or have diarrhea. Get help right away if you: Have symptoms of dehydration that get worse. Have a fever. Have a severe headache. Have been vomiting and the following happens: Your vomiting gets worse or does not go away. Your vomit includes blood or green matter (bile). You cannot eat or drink without vomiting. Have problems with urination or bowel movements, such as: Diarrhea that gets worse or does not go away. Blood in your stool (feces). This may cause stool to look black and tarry. Not urinating, or urinating only a small amount of very dark urine, within 6-8 hours. Have trouble breathing. Have symptoms that get worse with treatment. These symptoms may represent a serious problem that is an emergency. Do not wait to see if the symptoms will go away. Get medical help right away. Call your local emergency services (911 in the [place] Do not drive yourself to the hospital. Summary Rehydration is the replacement of body fluids and minerals (electrolytes) that are lost during dehydration. Follow instructions from your health care provider for rehydration. The kind of fluid and amount you should drink depend on your condition. Slowly increase how much you drink until you have taken the amount recommended by your health care provider. Contact your health care provider if you continue to show signs of mild or moderate dehydration. This information is not intended to replace advice given to you by your health care provider. Make sure you discuss any questions you have with your health care provider. Document Revised: 05/05/2019 Document Reviewed:  03/15/2019 Elsevier Patient Education  2022 [employer].

## 2020-11-27 ENCOUNTER — Ambulatory Visit (INDEPENDENT_AMBULATORY_CARE_PROVIDER_SITE_OTHER): Payer: Medicare HMO

## 2020-11-27 VITALS — BP 124/74 | HR 97 | Temp 98.3°F | Wt 155.8 lb

## 2020-11-27 DIAGNOSIS — Z0000 Encounter for general adult medical examination without abnormal findings: Secondary | ICD-10-CM | POA: Diagnosis not present

## 2020-11-27 NOTE — Patient Instructions (Signed)
[name] , Thank you for taking time to come for your Medicare Wellness Visit. I appreciate your ongoing commitment to your health goals. Please review the following plan we discussed and let me know if I can assist you in the future.   Screening recommendations/referrals: Colonoscopy: Done 07/12/13 repeat every 10 years 07/13/23 Mammogram: Done 11/02/19 repeat every year Bone Density: Done 10/01/18 repeat every 2 years  Recommended yearly ophthalmology/optometry visit for glaucoma screening and checkup Recommended yearly dental visit for hygiene and checkup  Vaccinations: Influenza vaccine: Due  Pneumococcal vaccine: Completed  Tdap vaccine: Due  Shingles vaccine: 1st dose 07/27/17    Covid-19:Completed 1/12, 2/12, 01/19/20 & 06/20/20  Advanced directives: Please bring a copy of your health care power of attorney and living will to the office at your convenience.  Conditions/risks identified: None at this time  Next appointment: Follow up in one year for your annual wellness visit    Preventive Care 65 Years and Older, Female Preventive care refers to lifestyle choices and visits with your health care provider that can promote health and wellness. What does preventive care include? A yearly physical exam. This is also called an annual well check. Dental exams once or twice a year. Routine eye exams. Ask your health care provider how often you should have your eyes checked. Personal lifestyle choices, including: Daily care of your teeth and gums. Regular physical activity. Eating a healthy diet. Avoiding tobacco and drug use. Limiting alcohol use. Practicing safe sex. Taking low-dose aspirin every day. Taking vitamin and mineral supplements as recommended by your health care provider. What happens during an annual well check? The services and screenings done by your health care provider during your annual well check will depend on your age, overall health, lifestyle risk factors,  and family history of disease. Counseling  Your health care provider may ask you questions about your: Alcohol use. Tobacco use. Drug use. Emotional well-being. Home and relationship well-being. Sexual activity. Eating habits. History of falls. Memory and ability to understand (cognition). Work and work Statistician. Reproductive health. Screening  You may have the following tests or measurements: Height, weight, and BMI. Blood pressure. Lipid and cholesterol levels. These may be checked every 5 years, or more frequently if you are over 26 years old. Skin check. Lung cancer screening. You may have this screening every year starting at age 47 if you have a 30-pack-year history of smoking and currently smoke or have quit within the past 15 years. Fecal occult blood test (FOBT) of the stool. You may have this test every year starting at age 8. Flexible sigmoidoscopy or colonoscopy. You may have a sigmoidoscopy every 5 years or a colonoscopy every 10 years starting at age 3. Hepatitis C blood test. Hepatitis B blood test. Sexually transmitted disease (STD) testing. Diabetes screening. This is done by checking your blood sugar (glucose) after you have not eaten for a while (fasting). You may have this done every 1-3 years. Bone density scan. This is done to screen for osteoporosis. You may have this done starting at age 60. Mammogram. This may be done every 1-2 years. Talk to your health care provider about how often you should have regular mammograms. Talk with your health care provider about your test results, treatment options, and if necessary, the need for more tests. Vaccines  Your health care provider may recommend certain vaccines, such as: Influenza vaccine. This is recommended every year. Tetanus, diphtheria, and acellular pertussis (Tdap, Td) vaccine. You may need a  Td booster every 10 years. Zoster vaccine. You may need this after age 52. Pneumococcal 13-valent conjugate  (PCV13) vaccine. One dose is recommended after age 54. Pneumococcal polysaccharide (PPSV23) vaccine. One dose is recommended after age 68. Talk to your health care provider about which screenings and vaccines you need and how often you need them. This information is not intended to replace advice given to you by your health care provider. Make sure you discuss any questions you have with your health care provider. Document Released: 03/31/2015 Document Revised: 11/22/2015 Document Reviewed: 01/03/2015 Elsevier Interactive Patient Education  2017 [place] Prevention in the Home Falls can cause injuries. They can happen to people of all ages. There are many things you can do to make your home safe and to help prevent falls. What can I do on the outside of my home? Regularly fix the edges of walkways and driveways and fix any cracks. Remove anything that might make you trip as you walk through a door, such as a raised step or threshold. Trim any bushes or trees on the path to your home. Use bright outdoor lighting. Clear any walking paths of anything that might make someone trip, such as rocks or tools. Regularly check to see if handrails are loose or broken. Make sure that both sides of any steps have handrails. Any raised decks and porches should have guardrails on the edges. Have any leaves, snow, or ice cleared regularly. Use sand or salt on walking paths during winter. Clean up any spills in your garage right away. This includes oil or grease spills. What can I do in the bathroom? Use night lights. Install grab bars by the toilet and in the tub and shower. Do not use towel bars as grab bars. Use non-skid mats or decals in the tub or shower. If you need to sit down in the shower, use a plastic, non-slip stool. Keep the floor dry. Clean up any water that spills on the floor as soon as it happens. Remove soap buildup in the tub or shower regularly. Attach bath mats securely with  double-sided non-slip rug tape. Do not have throw rugs and other things on the floor that can make you trip. What can I do in the bedroom? Use night lights. Make sure that you have a light by your bed that is easy to reach. Do not use any sheets or blankets that are too big for your bed. They should not hang down onto the floor. Have a firm chair that has side arms. You can use this for support while you get dressed. Do not have throw rugs and other things on the floor that can make you trip. What can I do in the kitchen? Clean up any spills right away. Avoid walking on wet floors. Keep items that you use a lot in easy-to-reach places. If you need to reach something above you, use a strong step stool that has a grab bar. Keep electrical cords out of the way. Do not use floor polish or wax that makes floors slippery. If you must use wax, use non-skid floor wax. Do not have throw rugs and other things on the floor that can make you trip. What can I do with my stairs? Do not leave any items on the stairs. Make sure that there are handrails on both sides of the stairs and use them. Fix handrails that are broken or loose. Make sure that handrails are as long as the stairways. Check any  carpeting to make sure that it is firmly attached to the stairs. Fix any carpet that is loose or worn. Avoid having throw rugs at the top or bottom of the stairs. If you do have throw rugs, attach them to the floor with carpet tape. Make sure that you have a light switch at the top of the stairs and the bottom of the stairs. If you do not have them, ask someone to add them for you. What else can I do to help prevent falls? Wear shoes that: Do not have high heels. Have rubber bottoms. Are comfortable and fit you well. Are closed at the toe. Do not wear sandals. If you use a stepladder: Make sure that it is fully opened. Do not climb a closed stepladder. Make sure that both sides of the stepladder are locked  into place. Ask someone to hold it for you, if possible. Clearly mark and make sure that you can see: Any grab bars or handrails. First and last steps. Where the edge of each step is. Use tools that help you move around (mobility aids) if they are needed. These include: Canes. Walkers. Scooters. Crutches. Turn on the lights when you go into a dark area. Replace any light bulbs as soon as they burn out. Set up your furniture so you have a clear path. Avoid moving your furniture around. If any of your floors are uneven, fix them. If there are any pets around you, be aware of where they are. Review your medicines with your doctor. Some medicines can make you feel dizzy. This can increase your chance of falling. Ask your doctor what other things that you can do to help prevent falls. This information is not intended to replace advice given to you by your health care provider. Make sure you discuss any questions you have with your health care provider. Document Released: 12/29/2008 Document Revised: 08/10/2015 Document Reviewed: 04/08/2014 Elsevier Interactive Patient Education  2017 [employer].

## 2020-11-27 NOTE — Progress Notes (Signed)
Subjective:   [name] is a 75 y.o. female who presents for Medicare Annual (Subsequent) preventive examination.  Review of Systems     Cardiac Risk Factors include: advanced age (>67mn, >>28women);hypertension;dyslipidemia     Objective:    Today's Vitals   11/27/20 1136  BP: 124/74  Pulse: 97  Temp: 98.3 F (36.8 C)  SpO2: 99%  Weight: 155 lb 12.8 oz (70.7 kg)  PainSc: 2    Body mass index is 25.93 kg/m.  Advanced Directives 11/27/2020 11/24/2020 11/10/2020 11/07/2020 11/03/2020 10/16/2020 10/14/2020  Does Patient Have a Medical Advance Directive? Yes Yes Yes No No No Yes  Type of [place] will - - Healthcare Power of [name] Does patient want to make changes to medical advance directive? - No - Patient declined - No - Patient declined No - Patient declined No - Patient declined No - Patient declined  Copy of HFairviewin Chart? No - copy requested - - - - - No - copy requested  Would patient like information on creating a medical advance directive? - - - - - - -  Pre-existing out of facility DNR order (yellow form or pink MOST form) - - - - - - -  Some encounter information is confidential and restricted. Go to Review Flowsheets activity to see all data.    Current Medications (verified) Outpatient Encounter Medications as of 11/27/2020  Medication Sig   ACCU-CHEK AVIVA PLUS test strip    Accu-Chek Softclix Lancets lancets    ALPRAZolam (XANAX) 0.5 MG tablet Take 1 tablet (0.5 mg total) by mouth daily as needed for anxiety.   amLODipine (NORVASC) 5 MG tablet Take 1 tablet (5 mg total) by mouth daily. (Patient taking differently: Take 5 mg by mouth at bedtime.)   aspirin EC 81 MG tablet Take 81 mg by mouth every morning.   Blood Glucose Monitoring Suppl (ACCU-CHEK GUIDE) w/Device KIT    cyanocobalamin (,VITAMIN B-12,) 1000 MCG/ML injection  INJECT 1 ML EVERY 2 WEEKS (Patient taking differently: Inject 1,000 mcg into the muscle every 14 (fourteen) days.)   docusate sodium (COLACE) 100 MG capsule Take 100 mg by mouth 2 (two) times daily as needed (constipation).   levothyroxine (SYNTHROID) 125 MCG tablet TAKE 1 TABLET ON AN EMPTY STOMACH IN THE MORNING (Patient taking differently: Take 125 mcg by mouth daily before breakfast.)   lidocaine-prilocaine (EMLA) cream Apply 1 application topically as needed (prior to port access).   omeprazole (PRILOSEC) 20 MG capsule TAKE 1 CAPSULE BY MOUTH EVERY DAY   ondansetron (ZOFRAN) 8 MG tablet Take 1 tablet (8 mg total) by mouth 2 (two) times daily as needed (Nausea or vomiting).   polyethylene glycol (MIRALAX / GLYCOLAX) 17 g packet Take 17 g by mouth daily.   potassium chloride SA (KLOR-CON) 20 MEQ tablet Take 1 tablet (20 mEq total) by mouth daily.   prochlorperazine (COMPAZINE) 10 MG tablet Take 1 tablet (10 mg total) by mouth every 6 (six) hours as needed (Nausea or vomiting).   [DISCONTINUED] HYDROcodone-acetaminophen (NORCO/VICODIN) 5-325 MG tablet Take 1 tablet by mouth every 6 (six) hours as needed for moderate pain. (Patient not taking: Reported on 11/27/2020)   No facility-administered encounter medications on file as of 11/27/2020.    Allergies (verified) Penicillins   History: Past Medical History:  Diagnosis Date   Anxiety    Arthritis    Cancer ([place]  Depression    Diabetes mellitus without complication ([place])    Hyperlipemia    Hypertension    Thyroid disease    TIA (transient ischemic attack)    Vitamin B12 deficiency 10/20/2019   Past Surgical History:  Procedure Laterality Date   ABDOMINAL HYSTERECTOMY     BILIARY STENT PLACEMENT N/A 09/28/2020   Procedure: BILIARY STENT PLACEMENT;  Surgeon: Toni Essex, MD;  Location: [facility];  Service: Endoscopy;  Laterality: N/A;   BREAST BIOPSY     ERCP N/A 09/28/2020   Procedure: ENDOSCOPIC RETROGRADE  CHOLANGIOPANCREATOGRAPHY (ERCP);  Surgeon: Toni Essex, MD;  Location: [facility];  Service: Endoscopy;  Laterality: N/A;   IR IMAGING GUIDED PORT INSERTION  09/22/2020   SPHINCTEROTOMY  09/28/2020   Procedure: [name];  Surgeon: Toni Essex, MD;  Location: [facility];  Service: Endoscopy;;   TUBAL LIGATION     Family History  Problem Relation Age of Onset   Diabetes Mother    Alzheimer's disease Mother    Dementia Mother    COPD Father    Emphysema Father    Heart disease Sister    Alcohol abuse Brother    Heart disease Brother    Emphysema Brother    Heart attack Daughter    Cancer Other        breast cancer   Social History   Socioeconomic History   Marital status: Married    Spouse name: Not on file   Number of children: Not on file   Years of education: Not on file   Highest education level: Not on file  Occupational History   Occupation: retired    Comment: Geophysicist/field seismologist   Occupation: caregiver  Tobacco Use   Smoking status: Former    Packs/day: 1.00    Years: 10.00    Pack years: 10.00    Types: Cigarettes    Quit date: 05/25/1987    Years since quitting: 33.5   Smokeless tobacco: Never  Vaping Use   Vaping Use: Never used  Substance and Sexual Activity   Alcohol use: Yes    Alcohol/week: 21.0 standard drinks    Types: 21 Glasses of wine per week    Comment: 3 glasses of wine daily, she stopped in 08/2020   Drug use: No   Sexual activity: Not Currently    Birth control/protection: Post-menopausal  Other Topics Concern   Not on file  Social History Narrative   Not on file   Social Determinants of Health   Financial Resource Strain: Low Risk    Difficulty of Paying Living Expenses: Not hard at all  Food Insecurity: No Food Insecurity   Worried About Charity fundraiser in the Last Year: Never true   Ran Out of Food in the Last Year: Never true  Transportation Needs: No Transportation Needs   Lack of Transportation (Medical): No   Lack of  Transportation (Non-Medical): No  Physical Activity: Insufficiently Active   Days of Exercise per Week: 2 days   Minutes of Exercise per Session: 50 min  Stress: No Stress Concern Present   Feeling of Stress : Only a little  Social Connections: Engineer, building services of Communication with Friends and Family: More than three times a week   Frequency of Social Gatherings with Friends and Family: More than three times a week   Attends Religious Services: 1 to 4 times per year   Active Member of [employer] or Organizations: Yes   Attends Archivist Meetings: 1  to 4 times per year   Marital Status: Married    Tobacco Counseling Counseling given: Not Answered   Clinical Intake:  Pre-visit preparation completed: Yes  Pain : No/denies pain Pain Score: 2      BMI - recorded: 25.93 Nutritional Status: BMI 25 -29 Overweight Nutritional Risks: None Diabetes: Yes CBG done?: Yes (142) CBG resulted in Enter/ Edit results?: No Did pt. bring in CBG monitor from home?: No  How often do you need to have someone help you when you read instructions, pamphlets, or other written materials from your doctor or pharmacy?: 1 - Never  Diabetic?Nutrition Risk Assessment:  Has the patient had any N/V/D within the last 2 months?  No  Does the patient have any non-healing wounds?  No  Has the patient had any unintentional weight loss or weight gain?  No   Diabetes:  Is the patient diabetic?  Yes  If diabetic, was a CBG obtained today?  Yes  Did the patient bring in their glucometer from home?  No  How often do you monitor your CBG's? daily.   Financial Strains and Diabetes Management:  Are you having any financial strains with the device, your supplies or your medication? No .  Does the patient want to be seen by Chronic Care Management for management of their diabetes?  No  Would the patient like to be referred to a Nutritionist or for Diabetic Management?  No   Diabetic  Exams:  Diabetic Eye Exam: Overdue for diabetic eye exam. Pt has been advised about the importance in completing this exam. Patient advised to call and schedule an eye exam. Diabetic Foot Exam: Completed 07/26/20   Interpreter Needed?: No  Information entered by :: Charlott Rakes, LPN   Activities of Daily Living In your present state of health, do you have any difficulty performing the following activities: 11/27/2020 09/24/2020  Hearing? N N  Vision? N N  Difficulty concentrating or making decisions? N N  Walking or climbing stairs? Y Y  Comment arthritis -  Dressing or bathing? N N  Doing errands, shopping? N N  Preparing Food and eating ? N -  Using the Toilet? N -  In the past six months, have you accidently leaked urine? Y -  Comment wears a pad or brief -  Do you have problems with loss of bowel control? N -  Managing your Medications? N -  Managing your Finances? N -  Housekeeping or managing your Housekeeping? N -  Some recent data might be hidden    Patient Care Team: [name], MD as PCP - General (Family Medicine) Ladell Pier, MD as Consulting Physician (Oncology)  Indicate any recent Medical Services you may have received from other than [facility] providers in the past year (date may be approximate).     Assessment:   This is a routine wellness examination for [place].  Hearing/Vision screen Hearing Screening - Comments:: Pt denies any hearing issues  Vision Screening - Comments:: Pt follows up with [facility] for annual eye exams   Dietary issues and exercise activities discussed: Current Exercise Habits: Structured exercise class, Type of exercise: Other - see comments (PT), Time (Minutes): 45, Frequency (Times/Week): 2, Weekly Exercise (Minutes/Week): 90   Goals Addressed             This Visit's Progress    Patient Stated       None at this time       Depression Screen [facility] 2/9  Scores 11/27/2020 10/06/2020 07/26/2020 11/18/2019 10/20/2019  PHQ  - 2 Score 0 '2 5 2 5  ' PHQ- 9 Score - '4 11 5 12    ' Fall Risk Fall Risk  11/27/2020 11/18/2019 10/20/2019 07/15/2016  Falls in the past year? 1 0 0 No  Number falls in past yr: 1 0 0 -  Injury with Fall? 0 0 0 -  Risk for fall due to : Impaired vision;Impaired balance/gait Impaired balance/gait;Impaired vision - -  Follow up Falls prevention discussed Falls prevention discussed - -    FALL RISK PREVENTION PERTAINING TO THE HOME:  Any stairs in or around the home? No  If so, are there any without handrails? No  Home free of loose throw rugs in walkways, pet beds, electrical cords, etc? Yes  Adequate lighting in your home to reduce risk of falls? Yes   ASSISTIVE DEVICES UTILIZED TO PREVENT FALLS:  Life alert? No  Use of a cane, walker or w/c? Yes  Grab bars in the bathroom? Yes  Shower chair or bench in shower? Yes  Elevated toilet seat or a handicapped toilet? Yes   TIMED UP AND GO:  Was the test performed? No .  Cognitive Function: MMSE - Mini Mental State Exam 07/15/2016  Not completed: Refused     6CIT Screen 11/27/2020 11/18/2019  What Year? 0 points 0 points  What month? 0 points 0 points  What time? 0 points -  Count back from 20 0 points 0 points  Months in reverse 0 points 0 points  Repeat phrase 0 points 2 points  Total Score 0 -    Immunizations Immunization History  Administered Date(s) Administered   Fluad Quad(high Dose 65+) 10/30/2018, 12/08/2019   Influenza, High Dose Seasonal PF 12/13/2013, 10/30/2018   Influenza-Unspecified 12/16/2013   Moderna SARS-COV2 Booster Vaccination 01/19/2020, 06/20/2020   Moderna Sars-Covid-2 Vaccination 03/30/2019, 04/30/2019   Pneumococcal Conjugate-13 03/09/2014   Pneumococcal Polysaccharide-23 06/24/2011   Tdap 02/12/2006   Zoster Recombinat (Shingrix) 07/27/2017    TDAP status: Due, Education has been provided regarding the importance of this vaccine. Advised may receive this vaccine at local pharmacy or Health Dept. Aware  to provide a copy of the vaccination record if obtained from local pharmacy or Health Dept. Verbalized acceptance and understanding.  Flu Vaccine status: Due, Education has been provided regarding the importance of this vaccine. Advised may receive this vaccine at local pharmacy or Health Dept. Aware to provide a copy of the vaccination record if obtained from local pharmacy or Health Dept. Verbalized acceptance and understanding.  Pneumococcal vaccine status: Up to date  Covid-19 vaccine status: Completed vaccines  Qualifies for Shingles Vaccine? Yes   Zostavax completed No   Shingrix Completed?: Yes  Screening Tests Health Maintenance  Topic Date Due   TETANUS/TDAP  02/13/2016   Zoster Vaccines- Shingrix (2 of 2) 09/21/2017   OPHTHALMOLOGY EXAM  08/30/2020   DEXA SCAN  09/30/2020   COVID-19 Vaccine (5 - Booster for Moderna series) 10/20/2020   MAMMOGRAM  11/01/2020   URINE MICROALBUMIN  11/07/2020   INFLUENZA VACCINE  11/29/2020 (Originally 10/16/2020)   HEMOGLOBIN A1C  03/28/2021   FOOT EXAM  07/26/2021   Hepatitis C Screening  Completed   PNA vac Low Risk Adult  Completed   HPV VACCINES  Aged Out    Health Maintenance  Health Maintenance Due  Topic Date Due   TETANUS/TDAP  02/13/2016   Zoster Vaccines- Shingrix (2 of 2) 09/21/2017   OPHTHALMOLOGY EXAM  08/30/2020  DEXA SCAN  09/30/2020   COVID-19 Vaccine (5 - Booster for Moderna series) 10/20/2020   MAMMOGRAM  11/01/2020   URINE MICROALBUMIN  11/07/2020    Colorectal cancer screening: Type of screening: Colonoscopy. Completed 10/11/13. Repeat every 10 years  Mammogram status: Completed 11/02/19. Repeat every year ordered 08/16/20  Bone Density status: Completed 10/01/18. Results reflect: Bone density results: NORMAL. Repeat every 2 years.   Additional Screening:  Hepatitis C Screening:  Completed 01/27/20  Vision Screening: Recommended annual ophthalmology exams for early detection of glaucoma and other disorders  of the eye. Is the patient up to date with their annual eye exam?  Yes  Who is the provider or what is the name of the office in which the patient attends annual eye exams? [facility]  If pt is not established with a provider, would they like to be referred to a provider to establish care? No .   Dental Screening: Recommended annual dental exams for proper oral hygiene  Community Resource Referral / Chronic Care Management: CRR required this visit?  No   CCM required this visit?  No      Plan:     I have personally reviewed and noted the following in the patient's chart:   Medical and social history Use of alcohol, tobacco or illicit drugs  Current medications and supplements including opioid prescriptions.  Functional ability and status Nutritional status Physical activity Advanced directives List of other physicians Hospitalizations, surgeries, and ER visits in previous 12 months Vitals Screenings to include cognitive, depression, and falls Referrals and appointments  In addition, I have reviewed and discussed with patient certain preventive protocols, quality metrics, and best practice recommendations. A written personalized care plan for preventive services as well as general preventive health recommendations were provided to patient.     Willette Brace, LPN   [id number]   Nurse Notes: None

## 2020-11-29 ENCOUNTER — Ambulatory Visit: Payer: Medicare HMO

## 2020-11-29 DIAGNOSIS — C7800 Secondary malignant neoplasm of unspecified lung: Secondary | ICD-10-CM | POA: Diagnosis not present

## 2020-11-29 DIAGNOSIS — E039 Hypothyroidism, unspecified: Secondary | ICD-10-CM | POA: Diagnosis not present

## 2020-11-29 DIAGNOSIS — E782 Mixed hyperlipidemia: Secondary | ICD-10-CM | POA: Diagnosis not present

## 2020-11-29 DIAGNOSIS — E119 Type 2 diabetes mellitus without complications: Secondary | ICD-10-CM | POA: Diagnosis not present

## 2020-11-29 DIAGNOSIS — I10 Essential (primary) hypertension: Secondary | ICD-10-CM | POA: Diagnosis not present

## 2020-11-29 DIAGNOSIS — C251 Malignant neoplasm of body of pancreas: Secondary | ICD-10-CM | POA: Diagnosis not present

## 2020-11-29 DIAGNOSIS — M159 Polyosteoarthritis, unspecified: Secondary | ICD-10-CM | POA: Diagnosis not present

## 2020-11-29 DIAGNOSIS — G893 Neoplasm related pain (acute) (chronic): Secondary | ICD-10-CM | POA: Diagnosis not present

## 2020-11-29 DIAGNOSIS — C787 Secondary malignant neoplasm of liver and intrahepatic bile duct: Secondary | ICD-10-CM | POA: Diagnosis not present

## 2020-11-30 DIAGNOSIS — I10 Essential (primary) hypertension: Secondary | ICD-10-CM | POA: Diagnosis not present

## 2020-11-30 DIAGNOSIS — C259 Malignant neoplasm of pancreas, unspecified: Secondary | ICD-10-CM | POA: Diagnosis not present

## 2020-11-30 DIAGNOSIS — E119 Type 2 diabetes mellitus without complications: Secondary | ICD-10-CM | POA: Diagnosis not present

## 2020-11-30 DIAGNOSIS — E039 Hypothyroidism, unspecified: Secondary | ICD-10-CM | POA: Diagnosis not present

## 2020-11-30 DIAGNOSIS — G893 Neoplasm related pain (acute) (chronic): Secondary | ICD-10-CM | POA: Diagnosis not present

## 2020-11-30 DIAGNOSIS — C7800 Secondary malignant neoplasm of unspecified lung: Secondary | ICD-10-CM | POA: Diagnosis not present

## 2020-11-30 DIAGNOSIS — E782 Mixed hyperlipidemia: Secondary | ICD-10-CM | POA: Diagnosis not present

## 2020-11-30 DIAGNOSIS — C251 Malignant neoplasm of body of pancreas: Secondary | ICD-10-CM | POA: Diagnosis not present

## 2020-11-30 DIAGNOSIS — C787 Secondary malignant neoplasm of liver and intrahepatic bile duct: Secondary | ICD-10-CM | POA: Diagnosis not present

## 2020-11-30 DIAGNOSIS — M159 Polyosteoarthritis, unspecified: Secondary | ICD-10-CM | POA: Diagnosis not present

## 2020-11-30 DIAGNOSIS — R7989 Other specified abnormal findings of blood chemistry: Secondary | ICD-10-CM | POA: Diagnosis not present

## 2020-11-30 DIAGNOSIS — R531 Weakness: Secondary | ICD-10-CM | POA: Diagnosis not present

## 2020-12-03 ENCOUNTER — Other Ambulatory Visit: Payer: Self-pay | Admitting: Oncology

## 2020-12-04 ENCOUNTER — Telehealth: Payer: Self-pay

## 2020-12-04 NOTE — Telephone Encounter (Signed)
pt called to say she lost crown.  told her ok to go to dentist.  Let them know she is getting Chemo. Verbalized understading.

## 2020-12-06 ENCOUNTER — Encounter: Payer: Self-pay | Admitting: Nurse Practitioner

## 2020-12-06 ENCOUNTER — Inpatient Hospital Stay: Payer: Medicare HMO

## 2020-12-06 ENCOUNTER — Other Ambulatory Visit: Payer: Self-pay

## 2020-12-06 ENCOUNTER — Inpatient Hospital Stay: Payer: Medicare HMO | Admitting: Nurse Practitioner

## 2020-12-06 ENCOUNTER — Other Ambulatory Visit: Payer: Self-pay | Admitting: Nurse Practitioner

## 2020-12-06 VITALS — BP 144/81 | HR 90 | Temp 98.7°F | Resp 18 | Ht 65.0 in | Wt 158.8 lb

## 2020-12-06 DIAGNOSIS — C250 Malignant neoplasm of head of pancreas: Secondary | ICD-10-CM | POA: Diagnosis not present

## 2020-12-06 DIAGNOSIS — Z5111 Encounter for antineoplastic chemotherapy: Secondary | ICD-10-CM | POA: Diagnosis not present

## 2020-12-06 DIAGNOSIS — M1990 Unspecified osteoarthritis, unspecified site: Secondary | ICD-10-CM | POA: Diagnosis not present

## 2020-12-06 DIAGNOSIS — C259 Malignant neoplasm of pancreas, unspecified: Secondary | ICD-10-CM

## 2020-12-06 DIAGNOSIS — E119 Type 2 diabetes mellitus without complications: Secondary | ICD-10-CM | POA: Diagnosis not present

## 2020-12-06 DIAGNOSIS — G893 Neoplasm related pain (acute) (chronic): Secondary | ICD-10-CM | POA: Diagnosis not present

## 2020-12-06 DIAGNOSIS — I10 Essential (primary) hypertension: Secondary | ICD-10-CM | POA: Diagnosis not present

## 2020-12-06 DIAGNOSIS — R300 Dysuria: Secondary | ICD-10-CM | POA: Diagnosis not present

## 2020-12-06 DIAGNOSIS — Z95828 Presence of other vascular implants and grafts: Secondary | ICD-10-CM

## 2020-12-06 LAB — CMP (CANCER CENTER ONLY)
ALT: 32 U/L (ref 0–44)
AST: 21 U/L (ref 15–41)
Albumin: 4.1 g/dL (ref 3.5–5.0)
Alkaline Phosphatase: 80 U/L (ref 38–126)
Anion gap: 10 (ref 5–15)
BUN: 12 mg/dL (ref 8–23)
CO2: 23 mmol/L (ref 22–32)
Calcium: 9.4 mg/dL (ref 8.9–10.3)
Chloride: 104 mmol/L (ref 98–111)
Creatinine: 0.76 mg/dL (ref 0.44–1.00)
GFR, Estimated: >60 mL/min (ref >60)
Glucose, Bld: 115 mg/dL — ABNORMAL HIGH (ref 70–99)
Potassium: 4 mmol/L (ref 3.5–5.1)
Sodium: 137 mmol/L (ref 135–145)
Total Bilirubin: 0.5 mg/dL (ref 0.3–1.2)
Total Protein: 6.5 g/dL (ref 6.5–8.1)

## 2020-12-06 LAB — URINALYSIS, COMPLETE (UACMP) WITH MICROSCOPIC
Bilirubin Urine: NEGATIVE
Glucose, UA: NEGATIVE mg/dL
Hgb urine dipstick: NEGATIVE
Ketones, ur: NEGATIVE mg/dL
Nitrite: NEGATIVE
Protein, ur: NEGATIVE mg/dL
Specific Gravity, Urine: 1.012 (ref 1.005–1.030)
pH: 6.5 (ref 5.0–8.0)

## 2020-12-06 LAB — CBC WITH DIFFERENTIAL (CANCER CENTER ONLY)
Abs Immature Granulocytes: 0.03 10*3/uL (ref 0.00–0.07)
Basophils Absolute: 0 10*3/uL (ref 0.0–0.1)
Basophils Relative: 1 %
Eosinophils Absolute: 0.2 10*3/uL (ref 0.0–0.5)
Eosinophils Relative: 3 %
HCT: 36.3 % (ref 36.0–46.0)
Hemoglobin: 12.1 g/dL (ref 12.0–15.0)
Immature Granulocytes: 1 %
Lymphocytes Relative: 37 %
Lymphs Abs: 2.4 10*3/uL (ref 0.7–4.0)
MCH: 31.3 pg (ref 26.0–34.0)
MCHC: 33.3 g/dL (ref 30.0–36.0)
MCV: 93.8 fL (ref 80.0–100.0)
Monocytes Absolute: 0.7 10*3/uL (ref 0.1–1.0)
Monocytes Relative: 10 %
Neutro Abs: 3.2 10*3/uL (ref 1.7–7.7)
Neutrophils Relative %: 48 %
Platelet Count: 144 10*3/uL — ABNORMAL LOW (ref 150–400)
RBC: 3.87 MIL/uL (ref 3.87–5.11)
RDW: 17.7 % — ABNORMAL HIGH (ref 11.5–15.5)
WBC Count: 6.5 10*3/uL (ref 4.0–10.5)
nRBC: 0 % (ref 0.0–0.2)

## 2020-12-06 MED ORDER — SODIUM CHLORIDE 0.9% FLUSH
10.0000 mL | Freq: Once | INTRAVENOUS | Status: AC
  Administered 2020-12-06: 10 mL via INTRAVENOUS

## 2020-12-06 MED ORDER — SULFAMETHOXAZOLE-TRIMETHOPRIM 800-160 MG PO TABS: 1.0000 | ORAL_TABLET | Freq: Two times a day (BID) | ORAL | 0 refills | Status: AC

## 2020-12-06 MED ORDER — HEPARIN SOD (PORK) LOCK FLUSH 100 UNIT/ML IV SOLN
500.0000 [IU] | Freq: Once | INTRAVENOUS | Status: AC
  Administered 2020-12-06: 500 [IU] via INTRAVENOUS

## 2020-12-06 NOTE — Progress Notes (Signed)
  [facility] OFFICE PROGRESS NOTE   Diagnosis: Pancreas cancer  INTERVAL HISTORY:   [name] returns as scheduled.  She completed cycle 4 gemcitabine/Abraxane 11/23/2020.  She denies nausea/vomiting.  No mouth sores.  No diarrhea.  No fever or rash after treatment.  She has intermittent left-sided abdominal pain.  The pain tends to be relieved with "Gas-X".  She reports numbness in the toes predating chemotherapy.  She thinks this may be slightly worse.  She has intermittent tingling in the fingertips.  She notes blood with nose blowing.  She complains of dysuria.  Objective:  Vital signs in last 24 hours:  Blood pressure (!) 144/81, pulse 90, temperature 98.7 F (37.1 C), resp. rate 18, height 5\' 5"  (1.651 m), weight 158 lb 12.8 oz (72 kg), SpO2 99 %.    HEENT: No thrush or ulcers. Resp: Lungs clear bilaterally. Cardio: Regular rate and rhythm. GI: Abdomen soft and nontender.  No hepatosplenomegaly. Vascular: No leg edema.  Port-A-Cath without erythema.  Lab Results:  Lab Results  Component Value Date   WBC 6.5 12/06/2020   HGB 12.1 12/06/2020   HCT 36.3 12/06/2020   MCV 93.8 12/06/2020   PLT 144 (L) 12/06/2020   NEUTROABS 3.2 12/06/2020    Imaging:  No results found.  Medications: I have reviewed the patient's current medications.  Assessment/Plan: Adenocarcinoma the pancreas body, stage IV (cT4,cN0,pM1) Ultrasound abdomen 08/18/2020-possible hypoechoic pancreas body mass, small hypoechoic liver areas MRI abdomen 08/27/2020-pancreas body mass, multiple hepatic lesions consistent with metastases, right abdominal omental nodularity, pancreas mass extends to the celiac bifurcation and abuts the splenic vein and splenoportal confluence CT chest 09/07/2020-scattered pulmonary nodules concerning for metastases, pancreas body mass, hepatic metastases Ultrasound-guided biopsy of a right liver lesion 09/08/2020-adenocarcinoma consistent with a pancreas primary CT  abdomen/pelvis 09/24/2020-pancreas neck mass, omental and peritoneal nodularity-unchanged, multiple hypoenhancing liver masses-unchanged, numerous small pulmonary nodules Cycle 1 gemcitabine/Abraxane 10/04/2020 Cycle 2 gemcitabine/Abraxane 10/27/2020 Cycle 3 gemcitabine/Abraxane 11/11/2018 Cycle 4 gemcitabine/Abraxane 11/23/2020   2.  Pain secondary #1 3.  Diabetes 4.  Hypertension 5.  Osteoarthritis 6.  Port-A-Cath placement 09/22/2020 7.  Admission with jaundice 09/24/2020.  MRI-new abrupt constriction of the common hepatic duct.  The infiltrative pancreatic mass extends medially in the porta hepatis to obstruct the common hepatic duct.  Multiple right hepatic lobe metastases mildly increased in size.  Stent placed into the common bile duct 09/28/2020.    Disposition: [name] appears unchanged.  She has completed 4 cycles of gemcitabine/Abraxane.  Plan to proceed with cycle 5 as scheduled 12/08/2020.  Restaging CTs prior to next office visit.  She had some numbness in the toes predating chemotherapy.  She thinks this may be slightly increased.  We will continue to monitor, consider holding Abraxane if neuropathy symptoms worsen.  CBC from today reviewed.  Counts adequate to proceed with treatment as above.  She will submit a urine specimen due to complaint of dysuria.  She will return for lab, follow-up, Gemcitabine/Abraxane in 2 weeks.    Ned Card ANP/GNP-BC   12/06/2020  1:57 PM

## 2020-12-06 NOTE — Patient Instructions (Signed)
Implanted Port Home Guide An implanted port is a device that is placed under the skin. It is usually placed in the chest. The device can be used to give IV medicine, to take blood, or for dialysis. You may have an implanted port if: You need IV medicine that would be irritating to the small veins in your hands or arms. You need IV medicines, such as antibiotics, for a long period of time. You need IV nutrition for a long period of time. You need dialysis. When you have a port, your health care provider can choose to use the port instead of veins in your arms for these procedures. You may have fewer limitations when using a port than you would if you used other types of long-term IVs, and you will likely be able to return to normal activities after your incision heals. An implanted port has two main parts: Reservoir. The reservoir is the part where a needle is inserted to give medicines or draw blood. The reservoir is round. After it is placed, it appears as a small, raised area under your skin. Catheter. The catheter is a thin, flexible tube that connects the reservoir to a vein. Medicine that is inserted into the reservoir goes into the catheter and then into the vein. How is my port accessed? To access your port: A numbing cream may be placed on the skin over the port site. Your health care provider will put on a mask and sterile gloves. The skin over your port will be cleaned carefully with a germ-killing soap and allowed to dry. Your health care provider will gently pinch the port and insert a needle into it. Your health care provider will check for a blood return to make sure the port is in the vein and is not clogged. If your port needs to remain accessed to get medicine continuously (constant infusion), your health care provider will place a clear bandage (dressing) over the needle site. The dressing and needle will need to be changed every week, or as told by your health care provider. What  is flushing? Flushing helps keep the port from getting clogged. Follow instructions from your health care provider about how and when to flush the port. Ports are usually flushed with saline solution or a medicine called heparin. The need for flushing will depend on how the port is used: If the port is only used from time to time to give medicines or draw blood, the port may need to be flushed: Before and after medicines have been given. Before and after blood has been drawn. As part of routine maintenance. Flushing may be recommended every 4-6 weeks. If a constant infusion is running, the port may not need to be flushed. Throw away any syringes in a disposal container that is meant for sharp items (sharps container). You can buy a sharps container from a pharmacy, or you can make one by using an empty hard plastic bottle with a cover. How long will my port stay implanted? The port can stay in for as long as your health care provider thinks it is needed. When it is time for the port to come out, a surgery will be done to remove it. The surgery will be similar to the procedure that was done to put the port in. Follow these instructions at home:  Flush your port as told by your health care provider. If you need an infusion over several days, follow instructions from your health care provider about how   to take care of your port site. Make sure you: Wash your hands with soap and water before you change your dressing. If soap and water are not available, use alcohol-based hand sanitizer. Change your dressing as told by your health care provider. Place any used dressings or infusion bags into a plastic bag. Throw that bag in the trash. Keep the dressing that covers the needle clean and dry. Do not get it wet. Do not use scissors or sharp objects near the tube. Keep the tube clamped, unless it is being used. Check your port site every day for signs of infection. Check for: Redness, swelling, or  pain. Fluid or blood. Pus or a bad smell. Protect the skin around the port site. Avoid wearing bra straps that rub or irritate the site. Protect the skin around your port from seat belts. Place a soft pad over your chest if needed. Bathe or shower as told by your health care provider. The site may get wet as long as you are not actively receiving an infusion. Return to your normal activities as told by your health care provider. Ask your health care provider what activities are safe for you. Carry a medical alert card or wear a medical alert bracelet at all times. This will let health care providers know that you have an implanted port in case of an emergency. Get help right away if: You have redness, swelling, or pain at the port site. You have fluid or blood coming from your port site. You have pus or a bad smell coming from the port site. You have a fever. Summary Implanted ports are usually placed in the chest for long-term IV access. Follow instructions from your health care provider about flushing the port and changing bandages (dressings). Take care of the area around your port by avoiding clothing that puts pressure on the area, and by watching for signs of infection. Protect the skin around your port from seat belts. Place a soft pad over your chest if needed. Get help right away if you have a fever or you have redness, swelling, pain, drainage, or a bad smell at the port site. This information is not intended to replace advice given to you by your health care provider. Make sure you discuss any questions you have with your health care provider. Document Revised: 05/24/2020 Document Reviewed: 07/19/2019 [employer] Patient Education  2022 [employer]  

## 2020-12-07 ENCOUNTER — Other Ambulatory Visit: Payer: Medicare HMO

## 2020-12-07 ENCOUNTER — Telehealth: Payer: Self-pay

## 2020-12-07 ENCOUNTER — Ambulatory Visit: Payer: Medicare HMO | Admitting: Oncology

## 2020-12-07 LAB — CANCER ANTIGEN 19-9: CA 19-9: 3779 U/mL — ABNORMAL HIGH (ref 0–35)

## 2020-12-07 NOTE — Telephone Encounter (Signed)
Pt calls in with concerns of ABT therapy stating she has concerns of loose stolls this nurses explains and confirms with provider loose stools to be a side effect of ANY abt therapy regimen and that it would be very difficult to predict how severe or not loose stool would be this nurse advises she take abt with food can try avoiding dairy products can take imodium and lomotil as ordered limit greasy fried foods pick times that pt would more likely be at home I also explained if pt does not experience loose stools with 24 hrs to 7 days of new treatment it is unlikely she will

## 2020-12-08 ENCOUNTER — Other Ambulatory Visit: Payer: Self-pay

## 2020-12-08 ENCOUNTER — Ambulatory Visit: Payer: Medicare HMO

## 2020-12-08 ENCOUNTER — Inpatient Hospital Stay: Payer: Medicare HMO

## 2020-12-08 VITALS — BP 127/60 | HR 87 | Temp 97.9°F | Resp 18

## 2020-12-08 DIAGNOSIS — E119 Type 2 diabetes mellitus without complications: Secondary | ICD-10-CM | POA: Diagnosis not present

## 2020-12-08 DIAGNOSIS — Z5111 Encounter for antineoplastic chemotherapy: Secondary | ICD-10-CM | POA: Diagnosis not present

## 2020-12-08 DIAGNOSIS — C259 Malignant neoplasm of pancreas, unspecified: Secondary | ICD-10-CM

## 2020-12-08 DIAGNOSIS — Z95828 Presence of other vascular implants and grafts: Secondary | ICD-10-CM

## 2020-12-08 DIAGNOSIS — I10 Essential (primary) hypertension: Secondary | ICD-10-CM | POA: Diagnosis not present

## 2020-12-08 DIAGNOSIS — R300 Dysuria: Secondary | ICD-10-CM | POA: Diagnosis not present

## 2020-12-08 DIAGNOSIS — M1990 Unspecified osteoarthritis, unspecified site: Secondary | ICD-10-CM | POA: Diagnosis not present

## 2020-12-08 DIAGNOSIS — C250 Malignant neoplasm of head of pancreas: Secondary | ICD-10-CM | POA: Diagnosis not present

## 2020-12-08 DIAGNOSIS — G893 Neoplasm related pain (acute) (chronic): Secondary | ICD-10-CM | POA: Diagnosis not present

## 2020-12-08 LAB — URINE CULTURE: Culture: 60000 — AB

## 2020-12-08 MED ORDER — SODIUM CHLORIDE 0.9 % IV SOLN: Freq: Once | INTRAVENOUS | Status: AC

## 2020-12-08 MED ORDER — PACLITAXEL PROTEIN-BOUND CHEMO INJECTION 100 MG
100.0000 mg/m2 | Freq: Once | INTRAVENOUS | Status: AC
  Administered 2020-12-08: 175 mg via INTRAVENOUS
  Filled 2020-12-08: qty 35

## 2020-12-08 MED ORDER — SODIUM CHLORIDE 0.9 % IV SOLN: INTRAVENOUS | Status: AC

## 2020-12-08 MED ORDER — HEPARIN SOD (PORK) LOCK FLUSH 100 UNIT/ML IV SOLN
500.0000 [IU] | Freq: Once | INTRAVENOUS | Status: AC
  Administered 2020-12-08: 500 [IU] via INTRAVENOUS

## 2020-12-08 MED ORDER — PROCHLORPERAZINE MALEATE 10 MG PO TABS
10.0000 mg | ORAL_TABLET | Freq: Once | ORAL | Status: AC
  Administered 2020-12-08: 10 mg via ORAL
  Filled 2020-12-08: qty 1

## 2020-12-08 MED ORDER — SODIUM CHLORIDE 0.9 % IV SOLN
800.0000 mg/m2 | Freq: Once | INTRAVENOUS | Status: AC
  Administered 2020-12-08: 1482 mg via INTRAVENOUS
  Filled 2020-12-08: qty 15.78

## 2020-12-08 NOTE — Patient Instructions (Signed)
[facility]  Discharge Instructions: Thank you for choosing [facility] to provide your oncology and hematology care.   If you have a lab appointment with the [facility], please go directly to the [facility] and check in at the registration area.   Wear comfortable clothing and clothing appropriate for easy access to any Portacath or PICC line.   We strive to give you quality time with your provider. You may need to reschedule your appointment if you arrive late (15 or more minutes).  Arriving late affects you and other patients whose appointments are after yours.  Also, if you miss three or more appointments without notifying the office, you may be dismissed from the clinic at the provider's discretion.      For prescription refill requests, have your pharmacy contact our office and allow 72 hours for refills to be completed.    Today you received the following chemotherapy and/or immunotherapy agents ABRAXENE and GAMZAR      To help prevent nausea and vomiting after your treatment, we encourage you to take your nausea medication as directed.  BELOW ARE SYMPTOMS THAT SHOULD BE REPORTED IMMEDIATELY: *FEVER GREATER THAN 100.4 F (38 C) OR HIGHER *CHILLS OR SWEATING *NAUSEA AND VOMITING THAT IS NOT CONTROLLED WITH YOUR NAUSEA MEDICATION *UNUSUAL SHORTNESS OF BREATH *UNUSUAL BRUISING OR BLEEDING *URINARY PROBLEMS (pain or burning when urinating, or frequent urination) *BOWEL PROBLEMS (unusual diarrhea, constipation, pain near the anus) TENDERNESS IN MOUTH AND THROAT WITH OR WITHOUT PRESENCE OF ULCERS (sore throat, sores in mouth, or a toothache) UNUSUAL RASH, SWELLING OR PAIN  UNUSUAL VAGINAL DISCHARGE OR ITCHING   Items with * indicate a potential emergency and should be followed up as soon as possible or go to the Emergency Department if any problems should occur.  Please show the CHEMOTHERAPY ALERT CARD or IMMUNOTHERAPY ALERT CARD at  check-in to the Emergency Department and triage nurse.  Should you have questions after your visit or need to cancel or reschedule your appointment, please contact [facility]  Dept: [phone]  and follow the prompts.  Office hours are 8:00 a.m. to 4:30 p.m. Monday - Friday. Please note that voicemails left after 4:00 p.m. may not be returned until the following business day.  We are closed weekends and major holidays. You have access to a nurse at all times for urgent questions. Please call the main number to the clinic Dept: [phone] and follow the prompts.   For any non-urgent questions, you may also contact your provider using MyChart. We now offer e-Visits for anyone 29 and older to request care online for non-urgent symptoms. For details visit [email or web address].   Also download the MyChart app! Go to the app store, search "MyChart", open the app, select [facility], and log in with your MyChart username and password.  Due to Covid, a mask is required upon entering the hospital/clinic. If you do not have a mask, one will be given to you upon arrival. For doctor visits, patients may have 1 support person aged 62 or older with them. For treatment visits, patients cannot have anyone with them due to current Covid guidelines and our immunocompromised population.   Nanoparticle Albumin-Bound Paclitaxel injection What is this medication? NANOPARTICLE ALBUMIN-BOUND PACLITAXEL (Na no PAHR ti kuhl al BYOO muhn-bound PAK li TAX el) is a chemotherapy drug. It targets fast dividing cells, like cancer cells, and causes these cells to die. This medicine is used to treat  advanced breast cancer, lung cancer, and pancreatic cancer. This medicine may be used for other purposes; ask your health care provider or pharmacist if you have questions. COMMON BRAND NAME(S): Abraxane What should I tell my care team before I take this medication? They need to know if you have any of  these conditions: kidney disease liver disease low blood counts, like low white cell, platelet, or red cell counts lung or breathing disease, like asthma tingling of the fingers or toes, or other nerve disorder an unusual or allergic reaction to paclitaxel, albumin, other chemotherapy, other medicines, foods, dyes, or preservatives pregnant or trying to get pregnant breast-feeding How should I use this medication? This drug is given as an infusion into a vein. It is administered in a hospital or clinic by a specially trained health care professional. Talk to your pediatrician regarding the use of this medicine in children. Special care may be needed. Overdosage: If you think you have taken too much of this medicine contact a poison control center or emergency room at once. NOTE: This medicine is only for you. Do not share this medicine with others. What if I miss a dose? It is important not to miss your dose. Call your doctor or health care professional if you are unable to keep an appointment. What may interact with this medication? This medicine may interact with the following medications: antiviral medicines for hepatitis, HIV or AIDS certain antibiotics like erythromycin and clarithromycin certain medicines for fungal infections like ketoconazole and itraconazole certain medicines for seizures like carbamazepine, phenobarbital, phenytoin gemfibrozil nefazodone rifampin St. John's wort This list may not describe all possible interactions. Give your health care provider a list of all the medicines, herbs, non-prescription drugs, or dietary supplements you use. Also tell them if you smoke, drink alcohol, or use illegal drugs. Some items may interact with your medicine. What should I watch for while using this medication? Your condition will be monitored carefully while you are receiving this medicine. You will need important blood work done while you are taking this medicine. This  medicine can cause serious allergic reactions. If you experience allergic reactions like skin rash, itching or hives, swelling of the face, lips, or tongue, tell your doctor or health care professional right away. In some cases, you may be given additional medicines to help with side effects. Follow all directions for their use. This drug may make you feel generally unwell. This is not uncommon, as chemotherapy can affect healthy cells as well as cancer cells. Report any side effects. Continue your course of treatment even though you feel ill unless your doctor tells you to stop. Call your doctor or health care professional for advice if you get a fever, chills or sore throat, or other symptoms of a cold or flu. Do not treat yourself. This drug decreases your body's ability to fight infections. Try to avoid being around people who are sick. This medicine may increase your risk to bruise or bleed. Call your doctor or health care professional if you notice any unusual bleeding. Be careful brushing and flossing your teeth or using a toothpick because you may get an infection or bleed more easily. If you have any dental work done, tell your dentist you are receiving this medicine. Avoid taking products that contain aspirin, acetaminophen, ibuprofen, naproxen, or ketoprofen unless instructed by your doctor. These medicines may hide a fever. Do not become pregnant while taking this medicine or for 6 months after stopping it. Women should inform their  doctor if they wish to become pregnant or think they might be pregnant. Men should not father a child while taking this medicine or for 3 months after stopping it. There is a potential for serious side effects to an unborn child. Talk to your health care professional or pharmacist for more information. Do not breast-feed an infant while taking this medicine or for 2 weeks after stopping it. This medicine may interfere with the ability to get pregnant or to father a  child. You should talk to your doctor or health care professional if you are concerned about your fertility. What side effects may I notice from receiving this medication? Side effects that you should report to your doctor or health care professional as soon as possible: allergic reactions like skin rash, itching or hives, swelling of the face, lips, or tongue breathing problems changes in vision fast, irregular heartbeat low blood pressure mouth sores pain, tingling, numbness in the hands or feet signs of decreased platelets or bleeding - bruising, pinpoint red spots on the skin, black, tarry stools, blood in the urine signs of decreased red blood cells - unusually weak or tired, feeling faint or lightheaded, falls signs of infection - fever or chills, cough, sore throat, pain or difficulty passing urine signs and symptoms of liver injury like dark yellow or brown urine; general ill feeling or flu-like symptoms; light-colored stools; loss of appetite; nausea; right upper belly pain; unusually weak or tired; yellowing of the eyes or skin swelling of the ankles, feet, hands unusually slow heartbeat Side effects that usually do not require medical attention (report to your doctor or health care professional if they continue or are bothersome): diarrhea hair loss loss of appetite nausea, vomiting tiredness This list may not describe all possible side effects. Call your doctor for medical advice about side effects. You may report side effects to FDA at 1-800-FDA-1088. Where should I keep my medication? This drug is given in a hospital or clinic and will not be stored at home. NOTE: This sheet is a summary. It may not cover all possible information. If you have questions about this medicine, talk to your doctor, pharmacist, or health care provider.  2022 Elsevier/Gold Standard (2016-11-05 13:03:45)  Gemcitabine injection What is this medication? GEMCITABINE (jem SYE ta been) is a  chemotherapy drug. This medicine is used to treat many types of cancer like breast cancer, lung cancer, pancreatic cancer, and ovarian cancer. This medicine may be used for other purposes; ask your health care provider or pharmacist if you have questions. COMMON BRAND NAME(S): Gemzar, Infugem What should I tell my care team before I take this medication? They need to know if you have any of these conditions: blood disorders infection kidney disease liver disease lung or breathing disease, like asthma recent or ongoing radiation therapy an unusual or allergic reaction to gemcitabine, other chemotherapy, other medicines, foods, dyes, or preservatives pregnant or trying to get pregnant breast-feeding How should I use this medication? This drug is given as an infusion into a vein. It is administered in a hospital or clinic by a specially trained health care professional. Talk to your pediatrician regarding the use of this medicine in children. Special care may be needed. Overdosage: If you think you have taken too much of this medicine contact a poison control center or emergency room at once. NOTE: This medicine is only for you. Do not share this medicine with others. What if I miss a dose? It is important not to miss  your dose. Call your doctor or health care professional if you are unable to keep an appointment. What may interact with this medication? medicines to increase blood counts like filgrastim, pegfilgrastim, sargramostim some other chemotherapy drugs like cisplatin vaccines Talk to your doctor or health care professional before taking any of these medicines: acetaminophen aspirin ibuprofen ketoprofen naproxen This list may not describe all possible interactions. Give your health care provider a list of all the medicines, herbs, non-prescription drugs, or dietary supplements you use. Also tell them if you smoke, drink alcohol, or use illegal drugs. Some items may interact with  your medicine. What should I watch for while using this medication? Visit your doctor for checks on your progress. This drug may make you feel generally unwell. This is not uncommon, as chemotherapy can affect healthy cells as well as cancer cells. Report any side effects. Continue your course of treatment even though you feel ill unless your doctor tells you to stop. In some cases, you may be given additional medicines to help with side effects. Follow all directions for their use. Call your doctor or health care professional for advice if you get a fever, chills or sore throat, or other symptoms of a cold or flu. Do not treat yourself. This drug decreases your body's ability to fight infections. Try to avoid being around people who are sick. This medicine may increase your risk to bruise or bleed. Call your doctor or health care professional if you notice any unusual bleeding. Be careful brushing and flossing your teeth or using a toothpick because you may get an infection or bleed more easily. If you have any dental work done, tell your dentist you are receiving this medicine. Avoid taking products that contain aspirin, acetaminophen, ibuprofen, naproxen, or ketoprofen unless instructed by your doctor. These medicines may hide a fever. Do not become pregnant while taking this medicine or for 6 months after stopping it. Women should inform their doctor if they wish to become pregnant or think they might be pregnant. Men should not father a child while taking this medicine and for 3 months after stopping it. There is a potential for serious side effects to an unborn child. Talk to your health care professional or pharmacist for more information. Do not breast-feed an infant while taking this medicine or for at least 1 week after stopping it. Men should inform their doctors if they wish to father a child. This medicine may lower sperm counts. Talk with your doctor or health care professional if you are  concerned about your fertility. What side effects may I notice from receiving this medication? Side effects that you should report to your doctor or health care professional as soon as possible: allergic reactions like skin rash, itching or hives, swelling of the face, lips, or tongue breathing problems pain, redness, or irritation at site where injected signs and symptoms of a dangerous change in heartbeat or heart rhythm like chest pain; dizziness; fast or irregular heartbeat; palpitations; feeling faint or lightheaded, falls; breathing problems signs of decreased platelets or bleeding - bruising, pinpoint red spots on the skin, black, tarry stools, blood in the urine signs of decreased red blood cells - unusually weak or tired, feeling faint or lightheaded, falls signs of infection - fever or chills, cough, sore throat, pain or difficulty passing urine signs and symptoms of kidney injury like trouble passing urine or change in the amount of urine signs and symptoms of liver injury like dark yellow or brown urine;  general ill feeling or flu-like symptoms; light-colored stools; loss of appetite; nausea; right upper belly pain; unusually weak or tired; yellowing of the eyes or skin swelling of ankles, feet, hands Side effects that usually do not require medical attention (report to your doctor or health care professional if they continue or are bothersome): constipation diarrhea hair loss loss of appetite nausea rash vomiting This list may not describe all possible side effects. Call your doctor for medical advice about side effects. You may report side effects to FDA at 1-800-FDA-1088. Where should I keep my medication? This drug is given in a hospital or clinic and will not be stored at home. NOTE: This sheet is a summary. It may not cover all possible information. If you have questions about this medicine, talk to your doctor, pharmacist, or health care provider.  2022 Elsevier/Gold  Standard (2017-05-28 18:06:11)

## 2020-12-08 NOTE — Progress Notes (Signed)
Patient presents for treatment. RN assessment completed along with the following:  Labs/vitals reviewed - Yes, and within treatment parameters.   Weight within 10% of previous measurement - Yes Oncology Treatment Attestation completed for current therapy- Yes, on date 09/12/20 Informed consent completed and reflects current therapy/intent - Yes, on date 10/12/20             Provider progress note reviewed - Patient not seen by provider today. Most recent note dated 12/06/20 reviewed. Treatment/Antibody/Supportive plan reviewed - Yes, and there are no adjustments needed for today's treatment."} S&H and other orders reviewed - Yes, and there are no additional orders identified. Previous treatment date reviewed - Yes, and the appropriate amount of time has elapsed between treatments.   Patient to proceed with treatment.

## 2020-12-11 ENCOUNTER — Other Ambulatory Visit: Payer: Medicare HMO

## 2020-12-13 ENCOUNTER — Telehealth: Payer: Self-pay | Admitting: Nurse Practitioner

## 2020-12-13 NOTE — Telephone Encounter (Signed)
I returned [name] earlier call.  She reports 2 red areas on her legs.  She noted nausea and decreased appetite following chemotherapy.  Both are improving.  She would like to come in for an office visit.  She was provided with an appointment for 12/14/2020 at 1045.

## 2020-12-14 ENCOUNTER — Other Ambulatory Visit: Payer: Self-pay

## 2020-12-14 ENCOUNTER — Inpatient Hospital Stay: Payer: Medicare HMO

## 2020-12-14 ENCOUNTER — Encounter: Payer: Self-pay | Admitting: Nurse Practitioner

## 2020-12-14 ENCOUNTER — Inpatient Hospital Stay: Payer: Medicare HMO | Admitting: Nurse Practitioner

## 2020-12-14 VITALS — BP 130/77 | HR 76 | Temp 98.1°F | Resp 18 | Ht 65.0 in | Wt 152.8 lb

## 2020-12-14 VITALS — BP 128/71 | HR 77

## 2020-12-14 DIAGNOSIS — C787 Secondary malignant neoplasm of liver and intrahepatic bile duct: Secondary | ICD-10-CM | POA: Diagnosis not present

## 2020-12-14 DIAGNOSIS — E119 Type 2 diabetes mellitus without complications: Secondary | ICD-10-CM | POA: Diagnosis not present

## 2020-12-14 DIAGNOSIS — M1990 Unspecified osteoarthritis, unspecified site: Secondary | ICD-10-CM | POA: Diagnosis not present

## 2020-12-14 DIAGNOSIS — I10 Essential (primary) hypertension: Secondary | ICD-10-CM | POA: Diagnosis not present

## 2020-12-14 DIAGNOSIS — M159 Polyosteoarthritis, unspecified: Secondary | ICD-10-CM | POA: Diagnosis not present

## 2020-12-14 DIAGNOSIS — E039 Hypothyroidism, unspecified: Secondary | ICD-10-CM | POA: Diagnosis not present

## 2020-12-14 DIAGNOSIS — C259 Malignant neoplasm of pancreas, unspecified: Secondary | ICD-10-CM

## 2020-12-14 DIAGNOSIS — E782 Mixed hyperlipidemia: Secondary | ICD-10-CM | POA: Diagnosis not present

## 2020-12-14 DIAGNOSIS — Z5111 Encounter for antineoplastic chemotherapy: Secondary | ICD-10-CM | POA: Diagnosis not present

## 2020-12-14 DIAGNOSIS — C251 Malignant neoplasm of body of pancreas: Secondary | ICD-10-CM | POA: Diagnosis not present

## 2020-12-14 DIAGNOSIS — C7800 Secondary malignant neoplasm of unspecified lung: Secondary | ICD-10-CM | POA: Diagnosis not present

## 2020-12-14 DIAGNOSIS — R300 Dysuria: Secondary | ICD-10-CM | POA: Diagnosis not present

## 2020-12-14 DIAGNOSIS — Z95828 Presence of other vascular implants and grafts: Secondary | ICD-10-CM

## 2020-12-14 DIAGNOSIS — G893 Neoplasm related pain (acute) (chronic): Secondary | ICD-10-CM | POA: Diagnosis not present

## 2020-12-14 DIAGNOSIS — C250 Malignant neoplasm of head of pancreas: Secondary | ICD-10-CM | POA: Diagnosis not present

## 2020-12-14 MED ORDER — SODIUM CHLORIDE 0.9 % IV SOLN: INTRAVENOUS | Status: DC

## 2020-12-14 MED ORDER — SODIUM CHLORIDE 0.9% FLUSH
10.0000 mL | Freq: Once | INTRAVENOUS | Status: AC
  Administered 2020-12-14: 10 mL via INTRAVENOUS

## 2020-12-14 MED ORDER — HEPARIN SOD (PORK) LOCK FLUSH 100 UNIT/ML IV SOLN
500.0000 [IU] | Freq: Once | INTRAVENOUS | Status: AC
  Administered 2020-12-14: 500 [IU] via INTRAVENOUS

## 2020-12-14 NOTE — Patient Instructions (Signed)

## 2020-12-14 NOTE — Progress Notes (Signed)
[name] OFFICE PROGRESS NOTE   Diagnosis: Pancreas cancer  INTERVAL HISTORY:   [name] returns prior to scheduled follow-up for evaluation of in general not feeling well.  She completed cycle 5 gemcitabine/Abraxane 12/06/2020.  She completed a 3-day course of Septra DS for urinary tract infection beginning 12/07/2021.  The urine symptoms have resolved.  She has mild intermittent nausea.  No vomiting.  Appetite is poor.  No abdominal pain.  She had a loose stool yesterday and 1 loose stool today.  She denies bleeding.   Objective:  Vital signs in last 24 hours:  Blood pressure 130/77, pulse 76, temperature 98.1 F (36.7 C), temperature source Oral, resp. rate 18, height 5\' 5"  (1.651 m), weight 152 lb 12.8 oz (69.3 kg), SpO2 100 %.    HEENT: No thrush or ulcers. Resp: Lungs clear bilaterally. Cardio: Regular rate and rhythm. GI: Abdomen soft and nontender.  No hepatomegaly. Vascular: No leg edema. Skin: Decreased skin turgor.  Chronic skin changes forearms and lower legs.  A few erythematous lesions bilateral pretibial regions. Port-A-Cath without erythema.  Lab Results:  Lab Results  Component Value Date   WBC 6.5 12/06/2020   HGB 12.1 12/06/2020   HCT 36.3 12/06/2020   MCV 93.8 12/06/2020   PLT 144 (L) 12/06/2020   NEUTROABS 3.2 12/06/2020    Imaging:  No results found.  Medications: I have reviewed the patient's current medications.  Assessment/Plan: Adenocarcinoma the pancreas body, stage IV (cT4,cN0,pM1) Ultrasound abdomen 08/18/2020-possible hypoechoic pancreas body mass, small hypoechoic liver areas MRI abdomen 08/27/2020-pancreas body mass, multiple hepatic lesions consistent with metastases, right abdominal omental nodularity, pancreas mass extends to the celiac bifurcation and abuts the splenic vein and splenoportal confluence CT chest 09/07/2020-scattered pulmonary nodules concerning for metastases, pancreas body mass, hepatic  metastases Ultrasound-guided biopsy of a right liver lesion 09/08/2020-adenocarcinoma consistent with a pancreas primary CT abdomen/pelvis 09/24/2020-pancreas neck mass, omental and peritoneal nodularity-unchanged, multiple hypoenhancing liver masses-unchanged, numerous small pulmonary nodules Cycle 1 gemcitabine/Abraxane 10/04/2020 Cycle 2 gemcitabine/Abraxane 10/27/2020 Cycle 3 gemcitabine/Abraxane 11/11/2018 Cycle 4 gemcitabine/Abraxane 11/23/2020 Cycle 5 gemcitabine/Abraxane 12/08/2020   2.  Pain secondary #1 3.  Diabetes 4.  Hypertension 5.  Osteoarthritis 6.  Port-A-Cath placement 09/22/2020 7.  Admission with jaundice 09/24/2020.  MRI-new abrupt constriction of the common hepatic duct.  The infiltrative pancreatic mass extends medially in the porta hepatis to obstruct the common hepatic duct.  Multiple right hepatic lobe metastases mildly increased in size.  Stent placed into the common bile duct 09/28/2020.  Disposition: [name] appears unchanged.  She has completed 5 cycles of gemcitabine/Abraxane.  She is scheduled for restaging CTs next week.  She has mild failure to thrive.  This is likely a combination of chemotherapy, cancer, recent urinary tract infection.  She may be mildly dehydrated.  She will receive a liter of IV fluids today.  She will try to increase fluid intake at home.  She is scheduled for restaging CTs 12/18/2020.  We will add a lab appointment prior to the CT scan.  She will return for follow-up as scheduled on 12/21/2020.  She will contact the office in the interim with further problems.  Patient seen with Dr. Benay Spice.    Ned Card ANP/GNP-BC   12/14/2020  10:56 AM This was a shared visit with Ned Card.  [name] was interviewed and examined.  She presents today with failure to thrive, likely secondary to chemotherapy and pancreas cancer.  She has erythematous lesions over the lower extremities, some  are ulcerated.  I suspect these lesions are related to a primary  skin condition as opposed to chemotherapy or malignancy.  We recommended she use Neosporin on an inflamed lesion at the left lower leg.  I was present for greater than 50% of today's visit.  I performed medical stage making.  Julieanne Manson, MD

## 2020-12-18 ENCOUNTER — Ambulatory Visit (HOSPITAL_BASED_OUTPATIENT_CLINIC_OR_DEPARTMENT_OTHER)
Admission: RE | Admit: 2020-12-18 | Discharge: 2020-12-18 | Disposition: A | Discharge status: Home / Self Care | Payer: Medicare HMO | Source: Ambulatory Visit | Attending: Nurse Practitioner | Admitting: Nurse Practitioner

## 2020-12-18 ENCOUNTER — Inpatient Hospital Stay: Payer: Medicare HMO

## 2020-12-18 ENCOUNTER — Other Ambulatory Visit: Payer: Self-pay

## 2020-12-18 DIAGNOSIS — C259 Malignant neoplasm of pancreas, unspecified: Secondary | ICD-10-CM | POA: Diagnosis not present

## 2020-12-18 DIAGNOSIS — K5730 Diverticulosis of large intestine without perforation or abscess without bleeding: Secondary | ICD-10-CM | POA: Diagnosis not present

## 2020-12-18 DIAGNOSIS — K449 Diaphragmatic hernia without obstruction or gangrene: Secondary | ICD-10-CM | POA: Diagnosis not present

## 2020-12-18 DIAGNOSIS — I700 Atherosclerosis of aorta: Secondary | ICD-10-CM | POA: Diagnosis not present

## 2020-12-18 MED ORDER — SODIUM CHLORIDE 0.9% FLUSH
10.0000 mL | INTRAVENOUS | Status: DC | PRN
  Filled 2020-12-18: qty 10

## 2020-12-18 MED ORDER — HEPARIN SOD (PORK) LOCK FLUSH 100 UNIT/ML IV SOLN
500.0000 [IU] | Freq: Once | INTRAVENOUS | Status: AC
  Administered 2020-12-18: 500 [IU] via INTRAVENOUS

## 2020-12-18 MED ORDER — IOHEXOL 350 MG/ML SOLN
50.0000 mL | Freq: Once | INTRAVENOUS | Status: AC | PRN
  Administered 2020-12-18: 50 mL via INTRAVENOUS

## 2020-12-18 MED ORDER — IOHEXOL 350 MG/ML SOLN: 75.0000 mL | Freq: Once | INTRAVENOUS | Status: DC | PRN

## 2020-12-19 ENCOUNTER — Ambulatory Visit (INDEPENDENT_AMBULATORY_CARE_PROVIDER_SITE_OTHER): Payer: Medicare HMO

## 2020-12-19 ENCOUNTER — Ambulatory Visit: Payer: Medicare HMO

## 2020-12-19 DIAGNOSIS — E538 Deficiency of other specified B group vitamins: Secondary | ICD-10-CM

## 2020-12-19 MED ORDER — CYANOCOBALAMIN 1000 MCG/ML IJ SOLN
1000.0000 ug | Freq: Once | INTRAMUSCULAR | Status: AC
  Administered 2020-12-19: 1000 ug via INTRAMUSCULAR

## 2020-12-19 NOTE — Progress Notes (Signed)
Per orders of Dr. Jonni Sanger , injection of B-12 given by Tobe Sos in right deltoid. Patient tolerated injection well. Patient will make appointment for 1 month.

## 2020-12-20 DIAGNOSIS — E039 Hypothyroidism, unspecified: Secondary | ICD-10-CM | POA: Diagnosis not present

## 2020-12-20 DIAGNOSIS — I10 Essential (primary) hypertension: Secondary | ICD-10-CM | POA: Diagnosis not present

## 2020-12-20 DIAGNOSIS — C251 Malignant neoplasm of body of pancreas: Secondary | ICD-10-CM | POA: Diagnosis not present

## 2020-12-20 DIAGNOSIS — E782 Mixed hyperlipidemia: Secondary | ICD-10-CM | POA: Diagnosis not present

## 2020-12-20 DIAGNOSIS — E119 Type 2 diabetes mellitus without complications: Secondary | ICD-10-CM | POA: Diagnosis not present

## 2020-12-20 DIAGNOSIS — G893 Neoplasm related pain (acute) (chronic): Secondary | ICD-10-CM | POA: Diagnosis not present

## 2020-12-20 DIAGNOSIS — C7800 Secondary malignant neoplasm of unspecified lung: Secondary | ICD-10-CM | POA: Diagnosis not present

## 2020-12-20 DIAGNOSIS — C787 Secondary malignant neoplasm of liver and intrahepatic bile duct: Secondary | ICD-10-CM | POA: Diagnosis not present

## 2020-12-20 DIAGNOSIS — M159 Polyosteoarthritis, unspecified: Secondary | ICD-10-CM | POA: Diagnosis not present

## 2020-12-21 ENCOUNTER — Inpatient Hospital Stay: Payer: Medicare HMO | Attending: Hematology | Admitting: Oncology

## 2020-12-21 ENCOUNTER — Inpatient Hospital Stay: Payer: Medicare HMO

## 2020-12-21 ENCOUNTER — Other Ambulatory Visit: Payer: Self-pay

## 2020-12-21 VITALS — BP 132/77 | HR 92 | Temp 97.8°F | Resp 18 | Ht 65.0 in | Wt 154.8 lb

## 2020-12-21 DIAGNOSIS — G893 Neoplasm related pain (acute) (chronic): Secondary | ICD-10-CM | POA: Insufficient documentation

## 2020-12-21 DIAGNOSIS — M1990 Unspecified osteoarthritis, unspecified site: Secondary | ICD-10-CM | POA: Diagnosis not present

## 2020-12-21 DIAGNOSIS — K449 Diaphragmatic hernia without obstruction or gangrene: Secondary | ICD-10-CM | POA: Diagnosis not present

## 2020-12-21 DIAGNOSIS — C259 Malignant neoplasm of pancreas, unspecified: Secondary | ICD-10-CM

## 2020-12-21 DIAGNOSIS — Z95828 Presence of other vascular implants and grafts: Secondary | ICD-10-CM

## 2020-12-21 DIAGNOSIS — R918 Other nonspecific abnormal finding of lung field: Secondary | ICD-10-CM | POA: Diagnosis not present

## 2020-12-21 DIAGNOSIS — E119 Type 2 diabetes mellitus without complications: Secondary | ICD-10-CM | POA: Insufficient documentation

## 2020-12-21 DIAGNOSIS — C251 Malignant neoplasm of body of pancreas: Secondary | ICD-10-CM | POA: Diagnosis not present

## 2020-12-21 DIAGNOSIS — I700 Atherosclerosis of aorta: Secondary | ICD-10-CM | POA: Insufficient documentation

## 2020-12-21 DIAGNOSIS — Z5111 Encounter for antineoplastic chemotherapy: Secondary | ICD-10-CM | POA: Insufficient documentation

## 2020-12-21 DIAGNOSIS — I10 Essential (primary) hypertension: Secondary | ICD-10-CM | POA: Diagnosis not present

## 2020-12-21 DIAGNOSIS — N390 Urinary tract infection, site not specified: Secondary | ICD-10-CM | POA: Diagnosis not present

## 2020-12-21 DIAGNOSIS — C250 Malignant neoplasm of head of pancreas: Secondary | ICD-10-CM | POA: Insufficient documentation

## 2020-12-21 DIAGNOSIS — C786 Secondary malignant neoplasm of retroperitoneum and peritoneum: Secondary | ICD-10-CM | POA: Diagnosis not present

## 2020-12-21 DIAGNOSIS — C787 Secondary malignant neoplasm of liver and intrahepatic bile duct: Secondary | ICD-10-CM | POA: Diagnosis not present

## 2020-12-21 LAB — CBC WITH DIFFERENTIAL (CANCER CENTER ONLY)
Abs Immature Granulocytes: 0.02 10*3/uL (ref 0.00–0.07)
Basophils Absolute: 0 10*3/uL (ref 0.0–0.1)
Basophils Relative: 1 %
Eosinophils Absolute: 0.1 10*3/uL (ref 0.0–0.5)
Eosinophils Relative: 2 %
HCT: 36.6 % (ref 36.0–46.0)
Hemoglobin: 12.2 g/dL (ref 12.0–15.0)
Immature Granulocytes: 0 %
Lymphocytes Relative: 33 %
Lymphs Abs: 2.1 10*3/uL (ref 0.7–4.0)
MCH: 31.9 pg (ref 26.0–34.0)
MCHC: 33.3 g/dL (ref 30.0–36.0)
MCV: 95.8 fL (ref 80.0–100.0)
Monocytes Absolute: 0.5 10*3/uL (ref 0.1–1.0)
Monocytes Relative: 8 %
Neutro Abs: 3.5 10*3/uL (ref 1.7–7.7)
Neutrophils Relative %: 56 %
Platelet Count: 138 10*3/uL — ABNORMAL LOW (ref 150–400)
RBC: 3.82 MIL/uL — ABNORMAL LOW (ref 3.87–5.11)
RDW: 18.2 % — ABNORMAL HIGH (ref 11.5–15.5)
WBC Count: 6.2 10*3/uL (ref 4.0–10.5)
nRBC: 0 % (ref 0.0–0.2)

## 2020-12-21 LAB — CMP (CANCER CENTER ONLY)
ALT: 55 U/L — ABNORMAL HIGH (ref 0–44)
AST: 52 U/L — ABNORMAL HIGH (ref 15–41)
Albumin: 4.1 g/dL (ref 3.5–5.0)
Alkaline Phosphatase: 69 U/L (ref 38–126)
Anion gap: 9 (ref 5–15)
BUN: 14 mg/dL (ref 8–23)
CO2: 23 mmol/L (ref 22–32)
Calcium: 9.4 mg/dL (ref 8.9–10.3)
Chloride: 105 mmol/L (ref 98–111)
Creatinine: 0.69 mg/dL (ref 0.44–1.00)
GFR, Estimated: >60 mL/min (ref >60)
Glucose, Bld: 151 mg/dL — ABNORMAL HIGH (ref 70–99)
Potassium: 4.1 mmol/L (ref 3.5–5.1)
Sodium: 137 mmol/L (ref 135–145)
Total Bilirubin: 0.5 mg/dL (ref 0.3–1.2)
Total Protein: 6.5 g/dL (ref 6.5–8.1)

## 2020-12-21 MED ORDER — SODIUM CHLORIDE 0.9% FLUSH
10.0000 mL | Freq: Once | INTRAVENOUS | Status: AC
  Administered 2020-12-21: 10 mL via INTRAVENOUS

## 2020-12-21 MED ORDER — HEPARIN SOD (PORK) LOCK FLUSH 100 UNIT/ML IV SOLN
500.0000 [IU] | Freq: Once | INTRAVENOUS | Status: AC
  Administered 2020-12-21: 500 [IU] via INTRAVENOUS

## 2020-12-21 NOTE — Progress Notes (Signed)
[facility] OFFICE PROGRESS NOTE   Diagnosis: Pancreas cancer  INTERVAL HISTORY:   [name] returns as scheduled.  She has used Neosporin on the leg lesions with partial improvement.  No pain.  No new complaint.   Objective:  Vital signs in last 24 hours:  Blood pressure 132/77, pulse 92, temperature 97.8 F (36.6 C), temperature source Oral, resp. rate 18, height 5\' 5"  (1.651 m), weight 154 lb 12.8 oz (70.2 kg), SpO2 100 %.    HEENT: No thrush or ulcers, yellow 1 cm cyst at the left tonsil Resp: Lungs clear bilaterally Cardio: Regular rate and rhythm GI: No hepatosplenomegaly, no mass, nontender Vascular: No leg edema  Skin: Multiple keratoses over the legs, several abrasions at the pretibial areas appear to be healing  Portacath/PICC-without erythema  Lab Results:  Lab Results  Component Value Date   WBC 6.2 12/21/2020   HGB 12.2 12/21/2020   HCT 36.6 12/21/2020   MCV 95.8 12/21/2020   PLT 138 (L) 12/21/2020   NEUTROABS 3.5 12/21/2020    CMP  Lab Results  Component Value Date   NA 137 12/21/2020   K 4.1 12/21/2020   CL 105 12/21/2020   CO2 23 12/21/2020   GLUCOSE 151 (H) 12/21/2020   BUN 14 12/21/2020   CREATININE 0.69 12/21/2020   CALCIUM 9.4 12/21/2020   PROT 6.5 12/21/2020   ALBUMIN 4.1 12/21/2020   AST 52 (H) 12/21/2020   ALT 55 (H) 12/21/2020   ALKPHOS 69 12/21/2020   BILITOT 0.5 12/21/2020   GFRNONAA >60 12/21/2020   GFRAA 71 02/02/2019    Lab Results  Component Value Date   CAN199 3,779 (H) 12/06/2020    Imaging:  CT Abdomen Pelvis W Contrast  Result Date: 12/19/2020 CLINICAL DATA:  Follow-up metastatic pancreatic carcinoma. Restaging. EXAM: CT ABDOMEN AND PELVIS WITH CONTRAST TECHNIQUE: Multidetector CT imaging of the abdomen and pelvis was performed using the standard protocol following bolus administration of intravenous contrast. CONTRAST:  83mL OMNIPAQUE IOHEXOL 350 MG/ML SOLN COMPARISON:  09/24/2020 FINDINGS: Lower  Chest: Several tiny less than 5 mm pulmonary nodules are again seen in both lung bases, largest in the left lower lobe measuring 4 mm on image 3/4. These show no significant change compared to recent exam. Hepatobiliary: Multiple small hypovascular masses are seen throughout the liver, consistent with liver metastases. The vast majority of these remains stable, although a single lesion in the lateral dome of the right hepatic lobe shows mild decrease in size, currently measuring 1.3 cm on image 13/2, compared to 2.1 cm previously. No new or enlarging liver lesions are identified. Gallbladder is unremarkable in appearance. Common bile duct stent is seen in appropriate position, and pneumobilia is noted. Pancreas: Low-attenuation mass in the pancreatic body measures 5.7 x 1.9 cm on image 20/2, without significant change compared to prior study. Atrophy of the pancreatic tail again noted. Spleen: Within normal limits in size and appearance. Adrenals/Urinary Tract: No masses identified. Small right renal cysts remains stable. No evidence of ureteral calculi or hydronephrosis. Stomach/Bowel: Small hiatal hernia is unchanged. No evidence of obstruction, inflammatory process or abnormal fluid collections. Diverticulosis is seen mainly involving the sigmoid colon, however there is no evidence of diverticulitis. Normal appendix visualized. Vascular/Lymphatic: No pathologically enlarged lymph nodes. No acute vascular findings. Aortic atherosclerotic calcification noted. Reproductive: Prior hysterectomy noted. Adnexal regions are unremarkable in appearance. Other: Previously seen soft tissue stranding and nodularity in the omental fat has resolved since previous study. No evidence of ascites.  Musculoskeletal:  No suspicious bone lesions identified. IMPRESSION: Stable mass in the pancreatic body. Multiple small liver metastases, with minimal improvement since prior exam. Resolution of omental soft tissue density, consistent  with decreased peritoneal carcinomatosis since prior exam. Stable tiny less than 5 mm indeterminate bilateral lower lobe pulmonary nodules. Recommend continued attention on follow-up imaging. No new or progressive metastatic disease identified. Stable small hiatal hernia. Colonic diverticulosis. No radiographic evidence of diverticulitis. Aortic Atherosclerosis (ICD10-I70.0). Electronically Signed   By: Marlaine Hind M.D.   On: 12/19/2020 13:41    Medications: I have reviewed the patient's current medications.   Assessment/Plan:  Adenocarcinoma the pancreas body, stage IV (cT4,cN0,pM1) Ultrasound abdomen 08/18/2020-possible hypoechoic pancreas body mass, small hypoechoic liver areas MRI abdomen 08/27/2020-pancreas body mass, multiple hepatic lesions consistent with metastases, right abdominal omental nodularity, pancreas mass extends to the celiac bifurcation and abuts the splenic vein and splenoportal confluence CT chest 09/07/2020-scattered pulmonary nodules concerning for metastases, pancreas body mass, hepatic metastases Ultrasound-guided biopsy of a right liver lesion 09/08/2020-adenocarcinoma consistent with a pancreas primary CT abdomen/pelvis 09/24/2020-pancreas neck mass, omental and peritoneal nodularity-unchanged, multiple hypoenhancing liver masses-unchanged, numerous small pulmonary nodules Cycle 1 gemcitabine/Abraxane 10/04/2020 Cycle 2 gemcitabine/Abraxane 10/27/2020 Cycle 3 gemcitabine/Abraxane 11/11/2018 Cycle 4 gemcitabine/Abraxane 11/23/2020 Cycle 5 gemcitabine/Abraxane 12/08/2020 CT abdomen/pelvis 12/18/2020-stable pancreas mass, stable and improved liver lesions, resolution of omental soft tissue density, stable indeterminate lung nodules, no disease progression Cycle 6 gemcitabine/Abraxane 12/22/2020   2.  Pain secondary #1 3.  Diabetes 4.  Hypertension 5.  Osteoarthritis 6.  Port-A-Cath placement 09/22/2020 7.  Admission with jaundice 09/24/2020.  MRI-new abrupt constriction of the  common hepatic duct.  The infiltrative pancreatic mass extends medially in the porta hepatis to obstruct the common hepatic duct.  Multiple right hepatic lobe metastases mildly increased in size.  Stent placed into the common bile duct 09/28/2020.   Disposition: [name] appears stable.  She is tolerating the gemcitabine/Abraxane well.  The restaging CT is consistent with a clinical response to chemotherapy.  She will complete another cycle of gemcitabine/Abraxane tomorrow.  The CA 19-9 was lower when she was here on 12/06/2020.  [name] will return for an office visit on 01/04/2021.  Betsy Coder, MD  12/21/2020  11:13 AM

## 2020-12-22 ENCOUNTER — Inpatient Hospital Stay: Payer: Medicare HMO

## 2020-12-22 VITALS — BP 108/68 | HR 79 | Temp 98.0°F | Resp 18 | Wt 153.6 lb

## 2020-12-22 DIAGNOSIS — C786 Secondary malignant neoplasm of retroperitoneum and peritoneum: Secondary | ICD-10-CM | POA: Diagnosis not present

## 2020-12-22 DIAGNOSIS — G893 Neoplasm related pain (acute) (chronic): Secondary | ICD-10-CM | POA: Diagnosis not present

## 2020-12-22 DIAGNOSIS — C259 Malignant neoplasm of pancreas, unspecified: Secondary | ICD-10-CM

## 2020-12-22 DIAGNOSIS — C787 Secondary malignant neoplasm of liver and intrahepatic bile duct: Secondary | ICD-10-CM | POA: Diagnosis not present

## 2020-12-22 DIAGNOSIS — E119 Type 2 diabetes mellitus without complications: Secondary | ICD-10-CM | POA: Diagnosis not present

## 2020-12-22 DIAGNOSIS — R918 Other nonspecific abnormal finding of lung field: Secondary | ICD-10-CM | POA: Diagnosis not present

## 2020-12-22 DIAGNOSIS — Z5111 Encounter for antineoplastic chemotherapy: Secondary | ICD-10-CM | POA: Diagnosis not present

## 2020-12-22 DIAGNOSIS — I700 Atherosclerosis of aorta: Secondary | ICD-10-CM | POA: Diagnosis not present

## 2020-12-22 DIAGNOSIS — K449 Diaphragmatic hernia without obstruction or gangrene: Secondary | ICD-10-CM | POA: Diagnosis not present

## 2020-12-22 DIAGNOSIS — C250 Malignant neoplasm of head of pancreas: Secondary | ICD-10-CM | POA: Diagnosis not present

## 2020-12-22 LAB — CANCER ANTIGEN 19-9: CA 19-9: 2665 U/mL — ABNORMAL HIGH (ref 0–35)

## 2020-12-22 MED ORDER — PROCHLORPERAZINE MALEATE 10 MG PO TABS
10.0000 mg | ORAL_TABLET | Freq: Once | ORAL | Status: AC
  Administered 2020-12-22: 10 mg via ORAL
  Filled 2020-12-22: qty 1

## 2020-12-22 MED ORDER — PACLITAXEL PROTEIN-BOUND CHEMO INJECTION 100 MG
100.0000 mg/m2 | Freq: Once | INTRAVENOUS | Status: AC
  Administered 2020-12-22: 175 mg via INTRAVENOUS
  Filled 2020-12-22: qty 35

## 2020-12-22 MED ORDER — SODIUM CHLORIDE 0.9 % IV SOLN
Freq: Once | INTRAVENOUS | Status: AC
Start: 2020-12-22 — End: 2020-12-22

## 2020-12-22 MED ORDER — SODIUM CHLORIDE 0.9 % IV SOLN
800.0000 mg/m2 | Freq: Once | INTRAVENOUS | Status: AC
  Administered 2020-12-22: 1482 mg via INTRAVENOUS
  Filled 2020-12-22: qty 26.3

## 2020-12-22 MED ORDER — SODIUM CHLORIDE 0.9 % IV SOLN: INTRAVENOUS | Status: AC

## 2020-12-22 NOTE — Patient Instructions (Signed)
[facility]  Discharge Instructions: Thank you for choosing [facility] to provide your oncology and hematology care.   If you have a lab appointment with the [facility], please go directly to the [facility] and check in at the registration area.   Wear comfortable clothing and clothing appropriate for easy access to any Portacath or PICC line.   We strive to give you quality time with your provider. You may need to reschedule your appointment if you arrive late (15 or more minutes).  Arriving late affects you and other patients whose appointments are after yours.  Also, if you miss three or more appointments without notifying the office, you may be dismissed from the clinic at the provider's discretion.      For prescription refill requests, have your pharmacy contact our office and allow 72 hours for refills to be completed.    Today you received the following chemotherapy and/or immunotherapy agents      ABRAXENE and GEMZAR To help prevent nausea and vomiting after your treatment, we encourage you to take your nausea medication as directed.  BELOW ARE SYMPTOMS THAT SHOULD BE REPORTED IMMEDIATELY: *FEVER GREATER THAN 100.4 F (38 C) OR HIGHER *CHILLS OR SWEATING *NAUSEA AND VOMITING THAT IS NOT CONTROLLED WITH YOUR NAUSEA MEDICATION *UNUSUAL SHORTNESS OF BREATH *UNUSUAL BRUISING OR BLEEDING *URINARY PROBLEMS (pain or burning when urinating, or frequent urination) *BOWEL PROBLEMS (unusual diarrhea, constipation, pain near the anus) TENDERNESS IN MOUTH AND THROAT WITH OR WITHOUT PRESENCE OF ULCERS (sore throat, sores in mouth, or a toothache) UNUSUAL RASH, SWELLING OR PAIN  UNUSUAL VAGINAL DISCHARGE OR ITCHING   Items with * indicate a potential emergency and should be followed up as soon as possible or go to the Emergency Department if any problems should occur.  Please show the CHEMOTHERAPY ALERT CARD or IMMUNOTHERAPY ALERT CARD at  check-in to the Emergency Department and triage nurse.  Should you have questions after your visit or need to cancel or reschedule your appointment, please contact [facility]  Dept: [phone]  and follow the prompts.  Office hours are 8:00 a.m. to 4:30 p.m. Monday - Friday. Please note that voicemails left after 4:00 p.m. may not be returned until the following business day.  We are closed weekends and major holidays. You have access to a nurse at all times for urgent questions. Please call the main number to the clinic Dept: [phone] and follow the prompts.   For any non-urgent questions, you may also contact your provider using MyChart. We now offer e-Visits for anyone 85 and older to request care online for non-urgent symptoms. For details visit [email or web address].   Also download the MyChart app! Go to the app store, search "MyChart", open the app, select [facility], and log in with your MyChart username and password.  Due to Covid, a mask is required upon entering the hospital/clinic. If you do not have a mask, one will be given to you upon arrival. For doctor visits, patients may have 1 support person aged 38 or older with them. For treatment visits, patients cannot have anyone with them due to current Covid guidelines and our immunocompromised population.   Nanoparticle Albumin-Bound Paclitaxel injection What is this medication? NANOPARTICLE ALBUMIN-BOUND PACLITAXEL (Na no PAHR ti kuhl al BYOO muhn-bound PAK li TAX el) is a chemotherapy drug. It targets fast dividing cells, like cancer cells, and causes these cells to die. This medicine is used to treat  advanced breast cancer, lung cancer, and pancreatic cancer. This medicine may be used for other purposes; ask your health care provider or pharmacist if you have questions. COMMON BRAND NAME(S): Abraxane What should I tell my care team before I take this medication? They need to know if you have any of  these conditions: kidney disease liver disease low blood counts, like low white cell, platelet, or red cell counts lung or breathing disease, like asthma tingling of the fingers or toes, or other nerve disorder an unusual or allergic reaction to paclitaxel, albumin, other chemotherapy, other medicines, foods, dyes, or preservatives pregnant or trying to get pregnant breast-feeding How should I use this medication? This drug is given as an infusion into a vein. It is administered in a hospital or clinic by a specially trained health care professional. Talk to your pediatrician regarding the use of this medicine in children. Special care may be needed. Overdosage: If you think you have taken too much of this medicine contact a poison control center or emergency room at once. NOTE: This medicine is only for you. Do not share this medicine with others. What if I miss a dose? It is important not to miss your dose. Call your doctor or health care professional if you are unable to keep an appointment. What may interact with this medication? This medicine may interact with the following medications: antiviral medicines for hepatitis, HIV or AIDS certain antibiotics like erythromycin and clarithromycin certain medicines for fungal infections like ketoconazole and itraconazole certain medicines for seizures like carbamazepine, phenobarbital, phenytoin gemfibrozil nefazodone rifampin [facility] John's wort This list may not describe all possible interactions. Give your health care provider a list of all the medicines, herbs, non-prescription drugs, or dietary supplements you use. Also tell them if you smoke, drink alcohol, or use illegal drugs. Some items may interact with your medicine. What should I watch for while using this medication? Your condition will be monitored carefully while you are receiving this medicine. You will need important blood work done while you are taking this medicine. This  medicine can cause serious allergic reactions. If you experience allergic reactions like skin rash, itching or hives, swelling of the face, lips, or tongue, tell your doctor or health care professional right away. In some cases, you may be given additional medicines to help with side effects. Follow all directions for their use. This drug may make you feel generally unwell. This is not uncommon, as chemotherapy can affect healthy cells as well as cancer cells. Report any side effects. Continue your course of treatment even though you feel ill unless your doctor tells you to stop. Call your doctor or health care professional for advice if you get a fever, chills or sore throat, or other symptoms of a cold or flu. Do not treat yourself. This drug decreases your body's ability to fight infections. Try to avoid being around people who are sick. This medicine may increase your risk to bruise or bleed. Call your doctor or health care professional if you notice any unusual bleeding. Be careful brushing and flossing your teeth or using a toothpick because you may get an infection or bleed more easily. If you have any dental work done, tell your dentist you are receiving this medicine. Avoid taking products that contain aspirin, acetaminophen, ibuprofen, naproxen, or ketoprofen unless instructed by your doctor. These medicines may hide a fever. Do not become pregnant while taking this medicine or for 6 months after stopping it. Women should inform their  doctor if they wish to become pregnant or think they might be pregnant. Men should not father a child while taking this medicine or for 3 months after stopping it. There is a potential for serious side effects to an unborn child. Talk to your health care professional or pharmacist for more information. Do not breast-feed an infant while taking this medicine or for 2 weeks after stopping it. This medicine may interfere with the ability to get pregnant or to father a  child. You should talk to your doctor or health care professional if you are concerned about your fertility. What side effects may I notice from receiving this medication? Side effects that you should report to your doctor or health care professional as soon as possible: allergic reactions like skin rash, itching or hives, swelling of the face, lips, or tongue breathing problems changes in vision fast, irregular heartbeat low blood pressure mouth sores pain, tingling, numbness in the hands or feet signs of decreased platelets or bleeding - bruising, pinpoint red spots on the skin, black, tarry stools, blood in the urine signs of decreased red blood cells - unusually weak or tired, feeling faint or lightheaded, falls signs of infection - fever or chills, cough, sore throat, pain or difficulty passing urine signs and symptoms of liver injury like dark yellow or brown urine; general ill feeling or flu-like symptoms; light-colored stools; loss of appetite; nausea; right upper belly pain; unusually weak or tired; yellowing of the eyes or skin swelling of the ankles, feet, hands unusually slow heartbeat Side effects that usually do not require medical attention (report to your doctor or health care professional if they continue or are bothersome): diarrhea hair loss loss of appetite nausea, vomiting tiredness This list may not describe all possible side effects. Call your doctor for medical advice about side effects. You may report side effects to FDA at 1-800-FDA-1088. Where should I keep my medication? This drug is given in a hospital or clinic and will not be stored at home. NOTE: This sheet is a summary. It may not cover all possible information. If you have questions about this medicine, talk to your doctor, pharmacist, or health care provider.  2022 Elsevier/Gold Standard (2016-11-05 13:03:45)   Gemcitabine injection What is this medication? GEMCITABINE (jem SYE ta been) is a  chemotherapy drug. This medicine is used to treat many types of cancer like breast cancer, lung cancer, pancreatic cancer, and ovarian cancer. This medicine may be used for other purposes; ask your health care provider or pharmacist if you have questions. COMMON BRAND NAME(S): Gemzar, Infugem What should I tell my care team before I take this medication? They need to know if you have any of these conditions: blood disorders infection kidney disease liver disease lung or breathing disease, like asthma recent or ongoing radiation therapy an unusual or allergic reaction to gemcitabine, other chemotherapy, other medicines, foods, dyes, or preservatives pregnant or trying to get pregnant breast-feeding How should I use this medication? This drug is given as an infusion into a vein. It is administered in a hospital or clinic by a specially trained health care professional. Talk to your pediatrician regarding the use of this medicine in children. Special care may be needed. Overdosage: If you think you have taken too much of this medicine contact a poison control center or emergency room at once. NOTE: This medicine is only for you. Do not share this medicine with others. What if I miss a dose? It is important not to  miss your dose. Call your doctor or health care professional if you are unable to keep an appointment. What may interact with this medication? medicines to increase blood counts like filgrastim, pegfilgrastim, sargramostim some other chemotherapy drugs like cisplatin vaccines Talk to your doctor or health care professional before taking any of these medicines: acetaminophen aspirin ibuprofen ketoprofen naproxen This list may not describe all possible interactions. Give your health care provider a list of all the medicines, herbs, non-prescription drugs, or dietary supplements you use. Also tell them if you smoke, drink alcohol, or use illegal drugs. Some items may interact with  your medicine. What should I watch for while using this medication? Visit your doctor for checks on your progress. This drug may make you feel generally unwell. This is not uncommon, as chemotherapy can affect healthy cells as well as cancer cells. Report any side effects. Continue your course of treatment even though you feel ill unless your doctor tells you to stop. In some cases, you may be given additional medicines to help with side effects. Follow all directions for their use. Call your doctor or health care professional for advice if you get a fever, chills or sore throat, or other symptoms of a cold or flu. Do not treat yourself. This drug decreases your body's ability to fight infections. Try to avoid being around people who are sick. This medicine may increase your risk to bruise or bleed. Call your doctor or health care professional if you notice any unusual bleeding. Be careful brushing and flossing your teeth or using a toothpick because you may get an infection or bleed more easily. If you have any dental work done, tell your dentist you are receiving this medicine. Avoid taking products that contain aspirin, acetaminophen, ibuprofen, naproxen, or ketoprofen unless instructed by your doctor. These medicines may hide a fever. Do not become pregnant while taking this medicine or for 6 months after stopping it. Women should inform their doctor if they wish to become pregnant or think they might be pregnant. Men should not father a child while taking this medicine and for 3 months after stopping it. There is a potential for serious side effects to an unborn child. Talk to your health care professional or pharmacist for more information. Do not breast-feed an infant while taking this medicine or for at least 1 week after stopping it. Men should inform their doctors if they wish to father a child. This medicine may lower sperm counts. Talk with your doctor or health care professional if you are  concerned about your fertility. What side effects may I notice from receiving this medication? Side effects that you should report to your doctor or health care professional as soon as possible: allergic reactions like skin rash, itching or hives, swelling of the face, lips, or tongue breathing problems pain, redness, or irritation at site where injected signs and symptoms of a dangerous change in heartbeat or heart rhythm like chest pain; dizziness; fast or irregular heartbeat; palpitations; feeling faint or lightheaded, falls; breathing problems signs of decreased platelets or bleeding - bruising, pinpoint red spots on the skin, black, tarry stools, blood in the urine signs of decreased red blood cells - unusually weak or tired, feeling faint or lightheaded, falls signs of infection - fever or chills, cough, sore throat, pain or difficulty passing urine signs and symptoms of kidney injury like trouble passing urine or change in the amount of urine signs and symptoms of liver injury like dark yellow or brown  urine; general ill feeling or flu-like symptoms; light-colored stools; loss of appetite; nausea; right upper belly pain; unusually weak or tired; yellowing of the eyes or skin swelling of ankles, feet, hands Side effects that usually do not require medical attention (report to your doctor or health care professional if they continue or are bothersome): constipation diarrhea hair loss loss of appetite nausea rash vomiting This list may not describe all possible side effects. Call your doctor for medical advice about side effects. You may report side effects to FDA at 1-800-FDA-1088. Where should I keep my medication? This drug is given in a hospital or clinic and will not be stored at home. NOTE: This sheet is a summary. It may not cover all possible information. If you have questions about this medicine, talk to your doctor, pharmacist, or health care provider.  2022 Elsevier/Gold  Standard (2017-05-28 18:06:11)

## 2020-12-27 DIAGNOSIS — E039 Hypothyroidism, unspecified: Secondary | ICD-10-CM | POA: Diagnosis not present

## 2020-12-27 DIAGNOSIS — E119 Type 2 diabetes mellitus without complications: Secondary | ICD-10-CM | POA: Diagnosis not present

## 2020-12-27 DIAGNOSIS — G893 Neoplasm related pain (acute) (chronic): Secondary | ICD-10-CM | POA: Diagnosis not present

## 2020-12-27 DIAGNOSIS — E782 Mixed hyperlipidemia: Secondary | ICD-10-CM | POA: Diagnosis not present

## 2020-12-27 DIAGNOSIS — C251 Malignant neoplasm of body of pancreas: Secondary | ICD-10-CM | POA: Diagnosis not present

## 2020-12-27 DIAGNOSIS — C787 Secondary malignant neoplasm of liver and intrahepatic bile duct: Secondary | ICD-10-CM | POA: Diagnosis not present

## 2020-12-27 DIAGNOSIS — C7800 Secondary malignant neoplasm of unspecified lung: Secondary | ICD-10-CM | POA: Diagnosis not present

## 2020-12-27 DIAGNOSIS — M159 Polyosteoarthritis, unspecified: Secondary | ICD-10-CM | POA: Diagnosis not present

## 2020-12-27 DIAGNOSIS — I10 Essential (primary) hypertension: Secondary | ICD-10-CM | POA: Diagnosis not present

## 2020-12-30 DIAGNOSIS — R7989 Other specified abnormal findings of blood chemistry: Secondary | ICD-10-CM | POA: Diagnosis not present

## 2020-12-30 DIAGNOSIS — R531 Weakness: Secondary | ICD-10-CM | POA: Diagnosis not present

## 2020-12-30 DIAGNOSIS — C259 Malignant neoplasm of pancreas, unspecified: Secondary | ICD-10-CM | POA: Diagnosis not present

## 2021-01-03 DIAGNOSIS — I10 Essential (primary) hypertension: Secondary | ICD-10-CM | POA: Diagnosis not present

## 2021-01-03 DIAGNOSIS — C7800 Secondary malignant neoplasm of unspecified lung: Secondary | ICD-10-CM | POA: Diagnosis not present

## 2021-01-03 DIAGNOSIS — E782 Mixed hyperlipidemia: Secondary | ICD-10-CM | POA: Diagnosis not present

## 2021-01-03 DIAGNOSIS — M159 Polyosteoarthritis, unspecified: Secondary | ICD-10-CM | POA: Diagnosis not present

## 2021-01-03 DIAGNOSIS — E039 Hypothyroidism, unspecified: Secondary | ICD-10-CM | POA: Diagnosis not present

## 2021-01-03 DIAGNOSIS — E119 Type 2 diabetes mellitus without complications: Secondary | ICD-10-CM | POA: Diagnosis not present

## 2021-01-03 DIAGNOSIS — G893 Neoplasm related pain (acute) (chronic): Secondary | ICD-10-CM | POA: Diagnosis not present

## 2021-01-03 DIAGNOSIS — C787 Secondary malignant neoplasm of liver and intrahepatic bile duct: Secondary | ICD-10-CM | POA: Diagnosis not present

## 2021-01-03 DIAGNOSIS — C251 Malignant neoplasm of body of pancreas: Secondary | ICD-10-CM | POA: Diagnosis not present

## 2021-01-04 ENCOUNTER — Inpatient Hospital Stay: Payer: Medicare HMO

## 2021-01-04 ENCOUNTER — Encounter: Payer: Self-pay | Admitting: Nurse Practitioner

## 2021-01-04 ENCOUNTER — Encounter: Payer: Self-pay | Admitting: Oncology

## 2021-01-04 ENCOUNTER — Other Ambulatory Visit (HOSPITAL_BASED_OUTPATIENT_CLINIC_OR_DEPARTMENT_OTHER): Payer: Self-pay

## 2021-01-04 ENCOUNTER — Inpatient Hospital Stay (HOSPITAL_BASED_OUTPATIENT_CLINIC_OR_DEPARTMENT_OTHER): Payer: Medicare HMO | Admitting: Nurse Practitioner

## 2021-01-04 ENCOUNTER — Other Ambulatory Visit: Payer: Self-pay

## 2021-01-04 VITALS — BP 148/90 | HR 88 | Temp 98.1°F | Resp 18 | Ht 65.0 in | Wt 154.4 lb

## 2021-01-04 DIAGNOSIS — R918 Other nonspecific abnormal finding of lung field: Secondary | ICD-10-CM | POA: Diagnosis not present

## 2021-01-04 DIAGNOSIS — C250 Malignant neoplasm of head of pancreas: Secondary | ICD-10-CM | POA: Diagnosis not present

## 2021-01-04 DIAGNOSIS — K449 Diaphragmatic hernia without obstruction or gangrene: Secondary | ICD-10-CM | POA: Diagnosis not present

## 2021-01-04 DIAGNOSIS — G893 Neoplasm related pain (acute) (chronic): Secondary | ICD-10-CM | POA: Diagnosis not present

## 2021-01-04 DIAGNOSIS — C259 Malignant neoplasm of pancreas, unspecified: Secondary | ICD-10-CM

## 2021-01-04 DIAGNOSIS — I700 Atherosclerosis of aorta: Secondary | ICD-10-CM | POA: Diagnosis not present

## 2021-01-04 DIAGNOSIS — C787 Secondary malignant neoplasm of liver and intrahepatic bile duct: Secondary | ICD-10-CM | POA: Diagnosis not present

## 2021-01-04 DIAGNOSIS — E119 Type 2 diabetes mellitus without complications: Secondary | ICD-10-CM | POA: Diagnosis not present

## 2021-01-04 DIAGNOSIS — Z5111 Encounter for antineoplastic chemotherapy: Secondary | ICD-10-CM | POA: Diagnosis not present

## 2021-01-04 DIAGNOSIS — C786 Secondary malignant neoplasm of retroperitoneum and peritoneum: Secondary | ICD-10-CM | POA: Diagnosis not present

## 2021-01-04 LAB — CBC WITH DIFFERENTIAL (CANCER CENTER ONLY)
Abs Immature Granulocytes: 0.04 10*3/uL (ref 0.00–0.07)
Basophils Absolute: 0 10*3/uL (ref 0.0–0.1)
Basophils Relative: 1 %
Eosinophils Absolute: 0.1 10*3/uL (ref 0.0–0.5)
Eosinophils Relative: 2 %
HCT: 37.7 % (ref 36.0–46.0)
Hemoglobin: 12.9 g/dL (ref 12.0–15.0)
Immature Granulocytes: 1 %
Lymphocytes Relative: 31 %
Lymphs Abs: 2.2 10*3/uL (ref 0.7–4.0)
MCH: 33 pg (ref 26.0–34.0)
MCHC: 34.2 g/dL (ref 30.0–36.0)
MCV: 96.4 fL (ref 80.0–100.0)
Monocytes Absolute: 0.6 10*3/uL (ref 0.1–1.0)
Monocytes Relative: 9 %
Neutro Abs: 4 10*3/uL (ref 1.7–7.7)
Neutrophils Relative %: 56 %
Platelet Count: 151 10*3/uL (ref 150–400)
RBC: 3.91 MIL/uL (ref 3.87–5.11)
RDW: 17.5 % — ABNORMAL HIGH (ref 11.5–15.5)
WBC Count: 7.1 10*3/uL (ref 4.0–10.5)
nRBC: 0 % (ref 0.0–0.2)

## 2021-01-04 LAB — CMP (CANCER CENTER ONLY)
ALT: 23 U/L (ref 0–44)
AST: 17 U/L (ref 15–41)
Albumin: 4.1 g/dL (ref 3.5–5.0)
Alkaline Phosphatase: 76 U/L (ref 38–126)
Anion gap: 11 (ref 5–15)
BUN: 16 mg/dL (ref 8–23)
CO2: 21 mmol/L — ABNORMAL LOW (ref 22–32)
Calcium: 9.2 mg/dL (ref 8.9–10.3)
Chloride: 104 mmol/L (ref 98–111)
Creatinine: 0.76 mg/dL (ref 0.44–1.00)
GFR, Estimated: >60 mL/min (ref >60)
Glucose, Bld: 143 mg/dL — ABNORMAL HIGH (ref 70–99)
Potassium: 3.7 mmol/L (ref 3.5–5.1)
Sodium: 136 mmol/L (ref 135–145)
Total Bilirubin: 0.6 mg/dL (ref 0.3–1.2)
Total Protein: 6.6 g/dL (ref 6.5–8.1)

## 2021-01-04 MED ORDER — FLUAD QUADRIVALENT 0.5 ML IM PRSY
PREFILLED_SYRINGE | INTRAMUSCULAR | 0 refills | Status: DC
  Filled 2021-01-04: qty 0.5, 1d supply, fill #0

## 2021-01-04 NOTE — Progress Notes (Signed)
  [facility] OFFICE PROGRESS NOTE   Diagnosis: Pancreas cancer  INTERVAL HISTORY:   [name] returns as scheduled.  She completed another cycle of gemcitabine/Abraxane 12/22/2020.  She denies nausea/vomiting.  No mouth sores.  No diarrhea.  She thinks neuropathy symptoms are worse in the fingertips.  She is having difficulty holding onto objects.  She reports recurrent urinary symptoms.  Objective:  Vital signs in last 24 hours:  Blood pressure (!) 148/90, pulse 88, temperature 98.1 F (36.7 C), temperature source Oral, resp. rate 18, height 5\' 5"  (1.651 m), weight 154 lb 6.4 oz (70 kg), SpO2 100 %.    HEENT: No thrush or ulcers. Resp: Lungs clear bilaterally. Cardio: Regular rate and rhythm. GI: Abdomen soft and nontender.  No hepatomegaly. Vascular: No leg edema. Neuro: Vibratory sense moderately decreased over the fingertips per tuning fork exam. Skin: No rash. Port-A-Cath without erythema.   Lab Results:  Lab Results  Component Value Date   WBC 7.1 01/04/2021   HGB 12.9 01/04/2021   HCT 37.7 01/04/2021   MCV 96.4 01/04/2021   PLT 151 01/04/2021   NEUTROABS 4.0 01/04/2021    Imaging:  No results found.  Medications: I have reviewed the patient's current medications.  Assessment/Plan: Adenocarcinoma the pancreas body, stage IV (cT4,cN0,pM1) Ultrasound abdomen 08/18/2020-possible hypoechoic pancreas body mass, small hypoechoic liver areas MRI abdomen 08/27/2020-pancreas body mass, multiple hepatic lesions consistent with metastases, right abdominal omental nodularity, pancreas mass extends to the celiac bifurcation and abuts the splenic vein and splenoportal confluence CT chest 09/07/2020-scattered pulmonary nodules concerning for metastases, pancreas body mass, hepatic metastases Ultrasound-guided biopsy of a right liver lesion 09/08/2020-adenocarcinoma consistent with a pancreas primary CT abdomen/pelvis 09/24/2020-pancreas neck mass, omental and  peritoneal nodularity-unchanged, multiple hypoenhancing liver masses-unchanged, numerous small pulmonary nodules Cycle 1 gemcitabine/Abraxane 10/04/2020 Cycle 2 gemcitabine/Abraxane 10/27/2020 Cycle 3 gemcitabine/Abraxane 11/11/2018 Cycle 4 gemcitabine/Abraxane 11/23/2020 Cycle 5 gemcitabine/Abraxane 12/08/2020 CT abdomen/pelvis 12/18/2020-stable pancreas mass, stable and improved liver lesions, resolution of omental soft tissue density, stable indeterminate lung nodules, no disease progression Cycle 6 gemcitabine/Abraxane 12/22/2020 Cycle 7 gemcitabine 01/05/2021, 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  Pain secondary #1 3.  Diabetes 4.  Hypertension 5.  Osteoarthritis 6.  Port-A-Cath placement 09/22/2020 7.  Admission with jaundice 09/24/2020.  MRI-new abrupt constriction of the common hepatic duct.  The infiltrative pancreatic mass extends medially in the porta hepatis to obstruct the common hepatic duct.  Multiple right hepatic lobe metastases mildly increased in size.  Stent placed into the common bile duct 09/28/2020.    Disposition: [name] appears stable.  She has completed 6 cycles of gemcitabine/Abraxane.  At today's visit she reports worsening neuropathy symptoms, difficulty holding objects.  We decided to hold Abraxane with tomorrow's treatment, proceed with Gemcitabine alone.  She agrees with this plan.  We reviewed the CBC from today.  Counts adequate to proceed with treatment.  She has recurrent urinary symptoms.  She will submit a urine specimen today.  She would like Dr. Jonni Sanger to direct any treatment.  We will forward the UA result to Dr. Tamela Oddi office.  She will return for follow-up in 2 weeks.  She will contact the office in the interim with any problems.    Ned Card ANP/GNP-BC   01/04/2021  2:02 PM

## 2021-01-05 ENCOUNTER — Other Ambulatory Visit (HOSPITAL_BASED_OUTPATIENT_CLINIC_OR_DEPARTMENT_OTHER): Payer: Self-pay

## 2021-01-05 ENCOUNTER — Encounter: Payer: Self-pay | Admitting: *Deleted

## 2021-01-05 ENCOUNTER — Inpatient Hospital Stay: Payer: Medicare HMO

## 2021-01-05 VITALS — BP 119/87 | HR 96 | Temp 98.6°F | Resp 17

## 2021-01-05 DIAGNOSIS — R918 Other nonspecific abnormal finding of lung field: Secondary | ICD-10-CM | POA: Diagnosis not present

## 2021-01-05 DIAGNOSIS — Z5111 Encounter for antineoplastic chemotherapy: Secondary | ICD-10-CM | POA: Diagnosis not present

## 2021-01-05 DIAGNOSIS — E119 Type 2 diabetes mellitus without complications: Secondary | ICD-10-CM | POA: Diagnosis not present

## 2021-01-05 DIAGNOSIS — C786 Secondary malignant neoplasm of retroperitoneum and peritoneum: Secondary | ICD-10-CM | POA: Diagnosis not present

## 2021-01-05 DIAGNOSIS — C787 Secondary malignant neoplasm of liver and intrahepatic bile duct: Secondary | ICD-10-CM | POA: Diagnosis not present

## 2021-01-05 DIAGNOSIS — G893 Neoplasm related pain (acute) (chronic): Secondary | ICD-10-CM | POA: Diagnosis not present

## 2021-01-05 DIAGNOSIS — C259 Malignant neoplasm of pancreas, unspecified: Secondary | ICD-10-CM

## 2021-01-05 DIAGNOSIS — K449 Diaphragmatic hernia without obstruction or gangrene: Secondary | ICD-10-CM | POA: Diagnosis not present

## 2021-01-05 DIAGNOSIS — I700 Atherosclerosis of aorta: Secondary | ICD-10-CM | POA: Diagnosis not present

## 2021-01-05 DIAGNOSIS — C250 Malignant neoplasm of head of pancreas: Secondary | ICD-10-CM | POA: Diagnosis not present

## 2021-01-05 LAB — URINALYSIS, COMPLETE (UACMP) WITH MICROSCOPIC
Bilirubin Urine: NEGATIVE
Glucose, UA: NEGATIVE mg/dL
Hgb urine dipstick: NEGATIVE
Ketones, ur: NEGATIVE mg/dL
Nitrite: NEGATIVE
Protein, ur: NEGATIVE mg/dL
Specific Gravity, Urine: 1.02 (ref 1.005–1.030)
WBC, UA: >50 WBC/hpf — ABNORMAL HIGH (ref 0–5)
pH: 5.5 (ref 5.0–8.0)

## 2021-01-05 MED ORDER — HEPARIN SOD (PORK) LOCK FLUSH 100 UNIT/ML IV SOLN
500.0000 [IU] | Freq: Once | INTRAVENOUS | Status: AC | PRN
  Administered 2021-01-05: 500 [IU]

## 2021-01-05 MED ORDER — SODIUM CHLORIDE 0.9 % IV SOLN: INTRAVENOUS | Status: AC

## 2021-01-05 MED ORDER — SODIUM CHLORIDE 0.9 % IV SOLN: Freq: Once | INTRAVENOUS | Status: AC

## 2021-01-05 MED ORDER — SODIUM CHLORIDE 0.9% FLUSH
10.0000 mL | INTRAVENOUS | Status: DC | PRN
  Administered 2021-01-05: 10 mL

## 2021-01-05 MED ORDER — SODIUM CHLORIDE 0.9 % IV SOLN
800.0000 mg/m2 | Freq: Once | INTRAVENOUS | Status: AC
  Administered 2021-01-05: 1482 mg via INTRAVENOUS
  Filled 2021-01-05: qty 26.3

## 2021-01-05 MED ORDER — PROCHLORPERAZINE MALEATE 10 MG PO TABS
10.0000 mg | ORAL_TABLET | Freq: Once | ORAL | Status: AC
  Administered 2021-01-05: 10 mg via ORAL
  Filled 2021-01-05: qty 1

## 2021-01-05 NOTE — Progress Notes (Signed)
Faxed 10/20 office note and U/A report of 10/21 to PCP, Dr. Jonni Sanger with message that patient wants her to manage the UTI.

## 2021-01-05 NOTE — Patient Instructions (Signed)
[name]  Discharge Instructions: Thank you for choosing [facility] to provide your oncology and hematology care.   If you have a lab appointment with the [facility], please go directly to the [facility] and check in at the registration area.   Wear comfortable clothing and clothing appropriate for easy access to any Portacath or PICC line.   We strive to give you quality time with your provider. You may need to reschedule your appointment if you arrive late (15 or more minutes).  Arriving late affects you and other patients whose appointments are after yours.  Also, if you miss three or more appointments without notifying the office, you may be dismissed from the clinic at the provider's discretion.      For prescription refill requests, have your pharmacy contact our office and allow 72 hours for refills to be completed.    Today you received the following chemotherapy and/or immunotherapy agents Gemzar      To help prevent nausea and vomiting after your treatment, we encourage you to take your nausea medication as directed.  BELOW ARE SYMPTOMS THAT SHOULD BE REPORTED IMMEDIATELY: *FEVER GREATER THAN 100.4 F (38 C) OR HIGHER *CHILLS OR SWEATING *NAUSEA AND VOMITING THAT IS NOT CONTROLLED WITH YOUR NAUSEA MEDICATION *UNUSUAL SHORTNESS OF BREATH *UNUSUAL BRUISING OR BLEEDING *URINARY PROBLEMS (pain or burning when urinating, or frequent urination) *BOWEL PROBLEMS (unusual diarrhea, constipation, pain near the anus) TENDERNESS IN MOUTH AND THROAT WITH OR WITHOUT PRESENCE OF ULCERS (sore throat, sores in mouth, or a toothache) UNUSUAL RASH, SWELLING OR PAIN  UNUSUAL VAGINAL DISCHARGE OR ITCHING   Items with * indicate a potential emergency and should be followed up as soon as possible or go to the Emergency Department if any problems should occur.  Please show the CHEMOTHERAPY ALERT CARD or IMMUNOTHERAPY ALERT CARD at check-in to the  Emergency Department and triage nurse.  Should you have questions after your visit or need to cancel or reschedule your appointment, please contact [facility]  Dept: [phone]  and follow the prompts.  Office hours are 8:00 a.m. to 4:30 p.m. Monday - Friday. Please note that voicemails left after 4:00 p.m. may not be returned until the following business day.  We are closed weekends and major holidays. You have access to a nurse at all times for urgent questions. Please call the main number to the clinic Dept: [phone] and follow the prompts.   For any non-urgent questions, you may also contact your provider using MyChart. We now offer e-Visits for anyone 53 and older to request care online for non-urgent symptoms. For details visit [email or web address].   Also download the MyChart app! Go to the app store, search "MyChart", open the app, select [facility], and log in with your MyChart username and password.  Due to Covid, a mask is required upon entering the hospital/clinic. If you do not have a mask, one will be given to you upon arrival. For doctor visits, patients may have 1 support person aged 29 or older with them. For treatment visits, patients cannot have anyone with them due to current Covid guidelines and our immunocompromised population.   Gemcitabine injection What is this medication? GEMCITABINE (jem SYE ta been) is a chemotherapy drug. This medicine is used to treat many types of cancer like breast cancer, lung cancer, pancreatic cancer, and ovarian cancer. This medicine may be used for other purposes; ask your health care provider or  pharmacist if you have questions. COMMON BRAND NAME(S): Gemzar, Infugem What should I tell my care team before I take this medication? They need to know if you have any of these conditions: blood disorders infection kidney disease liver disease lung or breathing disease, like asthma recent or ongoing radiation  therapy an unusual or allergic reaction to gemcitabine, other chemotherapy, other medicines, foods, dyes, or preservatives pregnant or trying to get pregnant breast-feeding How should I use this medication? This drug is given as an infusion into a vein. It is administered in a hospital or clinic by a specially trained health care professional. Talk to your pediatrician regarding the use of this medicine in children. Special care may be needed. Overdosage: If you think you have taken too much of this medicine contact a poison control center or emergency room at once. NOTE: This medicine is only for you. Do not share this medicine with others. What if I miss a dose? It is important not to miss your dose. Call your doctor or health care professional if you are unable to keep an appointment. What may interact with this medication? medicines to increase blood counts like filgrastim, pegfilgrastim, sargramostim some other chemotherapy drugs like cisplatin vaccines Talk to your doctor or health care professional before taking any of these medicines: acetaminophen aspirin ibuprofen ketoprofen naproxen This list may not describe all possible interactions. Give your health care provider a list of all the medicines, herbs, non-prescription drugs, or dietary supplements you use. Also tell them if you smoke, drink alcohol, or use illegal drugs. Some items may interact with your medicine. What should I watch for while using this medication? Visit your doctor for checks on your progress. This drug may make you feel generally unwell. This is not uncommon, as chemotherapy can affect healthy cells as well as cancer cells. Report any side effects. Continue your course of treatment even though you feel ill unless your doctor tells you to stop. In some cases, you may be given additional medicines to help with side effects. Follow all directions for their use. Call your doctor or health care professional for  advice if you get a fever, chills or sore throat, or other symptoms of a cold or flu. Do not treat yourself. This drug decreases your body's ability to fight infections. Try to avoid being around people who are sick. This medicine may increase your risk to bruise or bleed. Call your doctor or health care professional if you notice any unusual bleeding. Be careful brushing and flossing your teeth or using a toothpick because you may get an infection or bleed more easily. If you have any dental work done, tell your dentist you are receiving this medicine. Avoid taking products that contain aspirin, acetaminophen, ibuprofen, naproxen, or ketoprofen unless instructed by your doctor. These medicines may hide a fever. Do not become pregnant while taking this medicine or for 6 months after stopping it. Women should inform their doctor if they wish to become pregnant or think they might be pregnant. Men should not father a child while taking this medicine and for 3 months after stopping it. There is a potential for serious side effects to an unborn child. Talk to your health care professional or pharmacist for more information. Do not breast-feed an infant while taking this medicine or for at least 1 week after stopping it. Men should inform their doctors if they wish to father a child. This medicine may lower sperm counts. Talk with your doctor or health care  professional if you are concerned about your fertility. What side effects may I notice from receiving this medication? Side effects that you should report to your doctor or health care professional as soon as possible: allergic reactions like skin rash, itching or hives, swelling of the face, lips, or tongue breathing problems pain, redness, or irritation at site where injected signs and symptoms of a dangerous change in heartbeat or heart rhythm like chest pain; dizziness; fast or irregular heartbeat; palpitations; feeling faint or lightheaded, falls;  breathing problems signs of decreased platelets or bleeding - bruising, pinpoint red spots on the skin, black, tarry stools, blood in the urine signs of decreased red blood cells - unusually weak or tired, feeling faint or lightheaded, falls signs of infection - fever or chills, cough, sore throat, pain or difficulty passing urine signs and symptoms of kidney injury like trouble passing urine or change in the amount of urine signs and symptoms of liver injury like dark yellow or brown urine; general ill feeling or flu-like symptoms; light-colored stools; loss of appetite; nausea; right upper belly pain; unusually weak or tired; yellowing of the eyes or skin swelling of ankles, feet, hands Side effects that usually do not require medical attention (report to your doctor or health care professional if they continue or are bothersome): constipation diarrhea hair loss loss of appetite nausea rash vomiting This list may not describe all possible side effects. Call your doctor for medical advice about side effects. You may report side effects to FDA at 1-800-FDA-1088. Where should I keep my medication? This drug is given in a hospital or clinic and will not be stored at home. NOTE: This sheet is a summary. It may not cover all possible information. If you have questions about this medicine, talk to your doctor, pharmacist, or health care provider.  2022 Elsevier/Gold Standard (2017-05-28 18:06:11)

## 2021-01-05 NOTE — Progress Notes (Signed)
Patient presents for treatment. RN assessment completed along with the following:  Labs/vitals reviewed - Yes, and within treatment parameters.   Weight within 10% of previous measurement - Yes Oncology Treatment Attestation completed for current therapy- Yes, on date 09/12/20 Informed consent completed and reflects current therapy/intent - Yes, on date 10/12/20             Provider progress note reviewed - Patient not seen by provider today. Most recent note dated 01/04/21 reviewed. Treatment/Antibody/Supportive plan reviewed - Yes, and there are no adjustments needed for today's treatment. S&H and other orders reviewed - Yes, and will give NS Previous treatment date reviewed - Yes, and the appropriate amount of time has elapsed between treatments. Clinic Hand Off Received from - Leander Rams, NP  Patient to proceed with treatment.

## 2021-01-07 ENCOUNTER — Other Ambulatory Visit: Payer: Self-pay | Admitting: Family Medicine

## 2021-01-07 LAB — URINE CULTURE: Culture: 20000 — AB

## 2021-01-08 NOTE — Telephone Encounter (Signed)
Medication was discontinued, please advise

## 2021-01-09 ENCOUNTER — Telehealth: Payer: Self-pay

## 2021-01-09 MED ORDER — SULFAMETHOXAZOLE-TRIMETHOPRIM 800-160 MG PO TABS: 1.0000 | ORAL_TABLET | Freq: Two times a day (BID) | ORAL | 0 refills | Status: DC

## 2021-01-09 NOTE — Telephone Encounter (Signed)
Please call pt: I have reviewed urine study results.  Mild UTI: i've ordered septra DS to be taken twice a day for 3 days. OV if symptoms persist or worsen.  Thanks.

## 2021-01-09 NOTE — Telephone Encounter (Signed)
Patient is calling in stating that she had a urine sample done and was advised to contact us for results.

## 2021-01-09 NOTE — Telephone Encounter (Signed)
Please advise 

## 2021-01-09 NOTE — Telephone Encounter (Signed)
Patient states that she has an allergy to antibiotics with "sulfa" in them she states "I will not take that medication again. I refuse. If that is the only medication I can use I will just continue having a UTI". Says the last time she took it she was nauseous, felt sick, and had diarrhea. Let patient know that UTI's are treated based on the bacteria present in urine, and will see if there is something else that she may be able to use

## 2021-01-09 NOTE — Addendum Note (Signed)
Addended by: Billey Chang on: 01/09/2021 11:49 AM   Modules accepted: Orders

## 2021-01-10 DIAGNOSIS — E039 Hypothyroidism, unspecified: Secondary | ICD-10-CM | POA: Diagnosis not present

## 2021-01-10 DIAGNOSIS — M159 Polyosteoarthritis, unspecified: Secondary | ICD-10-CM | POA: Diagnosis not present

## 2021-01-10 DIAGNOSIS — I10 Essential (primary) hypertension: Secondary | ICD-10-CM | POA: Diagnosis not present

## 2021-01-10 DIAGNOSIS — C7800 Secondary malignant neoplasm of unspecified lung: Secondary | ICD-10-CM | POA: Diagnosis not present

## 2021-01-10 DIAGNOSIS — C787 Secondary malignant neoplasm of liver and intrahepatic bile duct: Secondary | ICD-10-CM | POA: Diagnosis not present

## 2021-01-10 DIAGNOSIS — E782 Mixed hyperlipidemia: Secondary | ICD-10-CM | POA: Diagnosis not present

## 2021-01-10 DIAGNOSIS — C251 Malignant neoplasm of body of pancreas: Secondary | ICD-10-CM | POA: Diagnosis not present

## 2021-01-10 DIAGNOSIS — E119 Type 2 diabetes mellitus without complications: Secondary | ICD-10-CM | POA: Diagnosis not present

## 2021-01-10 DIAGNOSIS — G893 Neoplasm related pain (acute) (chronic): Secondary | ICD-10-CM | POA: Diagnosis not present

## 2021-01-10 MED ORDER — CIPROFLOXACIN HCL 250 MG PO TABS: 250.0000 mg | ORAL_TABLET | Freq: Two times a day (BID) | ORAL | 0 refills | Status: AC

## 2021-01-10 NOTE — Addendum Note (Signed)
Addended by: Billey Chang on: 01/10/2021 08:45 AM   Modules accepted: Orders

## 2021-01-10 NOTE — Telephone Encounter (Signed)
Spoke with patient, gave a verbal understanding. Cipro does not have Septra in it

## 2021-01-10 NOTE — Telephone Encounter (Signed)
Changed to cipro 250 bid x 3 days. Please notify patient.   I reviewed note from September where she had diarrhea with Septra.

## 2021-01-11 ENCOUNTER — Encounter: Payer: Self-pay | Admitting: Family Medicine

## 2021-01-11 ENCOUNTER — Ambulatory Visit (INDEPENDENT_AMBULATORY_CARE_PROVIDER_SITE_OTHER): Payer: Medicare HMO | Admitting: Family Medicine

## 2021-01-11 ENCOUNTER — Other Ambulatory Visit: Payer: Self-pay

## 2021-01-11 VITALS — BP 130/82 | HR 106 | Temp 98.4°F | Ht 65.0 in | Wt 153.4 lb

## 2021-01-11 DIAGNOSIS — E119 Type 2 diabetes mellitus without complications: Secondary | ICD-10-CM | POA: Diagnosis not present

## 2021-01-11 DIAGNOSIS — N3000 Acute cystitis without hematuria: Secondary | ICD-10-CM

## 2021-01-11 DIAGNOSIS — E782 Mixed hyperlipidemia: Secondary | ICD-10-CM

## 2021-01-11 DIAGNOSIS — E538 Deficiency of other specified B group vitamins: Secondary | ICD-10-CM

## 2021-01-11 DIAGNOSIS — C259 Malignant neoplasm of pancreas, unspecified: Secondary | ICD-10-CM

## 2021-01-11 DIAGNOSIS — E039 Hypothyroidism, unspecified: Secondary | ICD-10-CM | POA: Diagnosis not present

## 2021-01-11 LAB — VITAMIN B12: Vitamin B-12: 555 pg/mL (ref 211–911)

## 2021-01-11 LAB — LIPID PANEL
Cholesterol: 143 mg/dL (ref 0–200)
HDL: 51.2 mg/dL (ref >39.00)
LDL Cholesterol: 73 mg/dL (ref 0–99)
NonHDL: 91.78
Total CHOL/HDL Ratio: 3
Triglycerides: 94 mg/dL (ref 0.0–149.0)
VLDL: 18.8 mg/dL (ref 0.0–40.0)

## 2021-01-11 LAB — TSH: TSH: 0.98 u[IU]/mL (ref 0.35–5.50)

## 2021-01-11 LAB — POCT GLYCOSYLATED HEMOGLOBIN (HGB A1C): Hemoglobin A1C: 5.9 % — AB (ref 4.0–5.6)

## 2021-01-11 MED ORDER — CYANOCOBALAMIN 1000 MCG/ML IJ SOLN: 1000.0000 ug | INTRAMUSCULAR | Status: DC

## 2021-01-11 NOTE — Patient Instructions (Signed)
Please return in 3 month for recheck.   Glad you are feeling ok!!!  Have a happy holiday season.  I will release your lab results to you on your MyChart account with further instructions. Please reply with any questions.    If you have any questions or concerns, please don't hesitate to send me a message via MyChart or call the office at [phone]. Thank you for visiting with [place] today! It's our pleasure caring for you.

## 2021-01-11 NOTE — Progress Notes (Signed)
Subjective  CC:  Chief Complaint  Patient presents with   Hypertension   Diabetes   Hyperlipidemia   Hypothyroidism    HPI: [name] is a 75 y.o. female who presents to the office today for follow up of diabetes and problems listed above in the chief complaint.  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  Undergoing chemotherapy.  Fortunately tolerating it fairly well now.  Has some peripheral neuropathy symptoms in her hands.  Mild post chemo nausea.  Otherwise appetite is fairly good.  Weight is slowly trending down.  She is trying to eat as much as possible.  I reviewed recent CBC and CMP. Diabetes follow up: Was diagnosed with diet-controlled diabetes recently.  More likely was a hypoglycemic reaction to her recent acute illness.  No symptoms of hyperglycemia now. Vitamin B12 deficiency: We decreased her B12 supplements to monthly.  She is anxious to have this checked again. History of hyperlipidemia but did not start atorvastatin last year due to history of myalgias.  Given her current cancer diagnosis, she likely does not need to start a statin.  She would like her cholesterol checked today.  She is nonfasting. Hypertension: On amlodipine 10 mg daily.  Has had a few elevated readings.  Normotensive in the office today. Hypothyroidism: Has had weight loss.  We will recheck TSH. Bladder symptoms: Reports about a 13-month history of bladder fullness.  Occasionally will have frequency but this has been chronic.  Rarely incontinent.  Rare dysuria.  Recent urine culture showed 20,000 colonies of Klebsiella.  Wt Readings from Last 3 Encounters:  01/11/21 153 lb 6.4 oz (69.6 kg)  01/04/21 154 lb 6.4 oz (70 kg)  12/22/20 153 lb 9.6 oz (69.7 kg)    BP Readings from Last 3 Encounters:  01/11/21 130/82  01/05/21 119/87  01/04/21 (!) 148/90    Assessment  1. Primary pancreatic cancer with metastasis to other site [facility])   2.  Diet-controlled diabetes mellitus ([place])   3. Acute cystitis without hematuria   4. Vitamin B12 deficiency   5. Mixed hyperlipidemia   6. Acquired hypothyroidism      Plan  Diabetes is currently very well controlled.  Diet-controlled versus hyperglycemia, impaired fasting glucose.  Reassured.  Unrestricted diet given cancer diagnosis. Pancreatic cancer with metastatic disease on chemotherapy.  Following along Acute cystitis versus overactive bladder symptoms versus symptoms related to recent hypoglycemia.  Education given.  We will follow along.  Treated with Cipro for 3 days.  Patient will follow-up as needed. History of hyperlipidemia: We will recheck today per patient's wishes.  Unlikely to have statin indication Hypothyroidism: Monitoring closely given weight loss.  Clinically euthyroid B12 deficiency: We will recheck again per patient's wishes.  Follow up: 3 months to recheck. Orders Placed This Encounter  Procedures   Vitamin B12   Lipid panel   TSH   POCT HgB A1C   Meds ordered this encounter  Medications   cyanocobalamin (,VITAMIN B-12,) 1000 MCG/ML injection    Sig: Inject 1 mL (1,000 mcg total) into the muscle every 30 (thirty) days.    DX Code Needed  .      Immunization History  Administered Date(s) Administered   Fluad Quad(high Dose 65+) 10/30/2018, 12/08/2019   Influenza, High Dose Seasonal PF 12/13/2013, 10/30/2018   Influenza-Unspecified 12/16/2013   Moderna SARS-COV2 Booster Vaccination 01/19/2020, 06/20/2020   Moderna Sars-Covid-2 Vaccination 03/30/2019, 04/30/2019   Pneumococcal Conjugate-13 03/09/2014   Pneumococcal Polysaccharide-23 06/24/2011  Tdap 02/12/2006   Zoster Recombinat (Shingrix) 07/27/2017    Diabetes Related Lab Review: Lab Results  Component Value Date   HGBA1C 5.9 (A) 01/11/2021   HGBA1C 7.1 (H) 09/25/2020   HGBA1C 7.1 (A) 07/26/2020    Lab Results  Component Value Date   MICROALBUR 0.5 11/08/2019   Lab Results  Component  Value Date   CREATININE 0.76 01/04/2021   BUN 16 01/04/2021   NA 136 01/04/2021   K 3.7 01/04/2021   CL 104 01/04/2021   CO2 21 (L) 01/04/2021   Lab Results  Component Value Date   CHOL 224 (H) 11/08/2019   CHOL 221 (H) 05/25/2012   Lab Results  Component Value Date   HDL 65 11/08/2019   HDL 58 05/25/2012   Lab Results  Component Value Date   LDLCALC 137 (H) 11/08/2019   LDLCALC 138 (H) 05/25/2012   Lab Results  Component Value Date   TRIG 110 11/08/2019   TRIG 127 05/25/2012   Lab Results  Component Value Date   CHOLHDL 3.4 11/08/2019   CHOLHDL 3.8 05/25/2012   No results found for: LDLDIRECT The 10-year ASCVD risk score (Arnett DK, et al., 2019) is: 45.3%   Values used to calculate the score:     Age: 89 years     Sex: Female     Is Non-Hispanic African American: No     Diabetic: Yes     Tobacco smoker: No     Systolic Blood Pressure: 657 mmHg     Is BP treated: Yes     HDL Cholesterol: 65 mg/dL     Total Cholesterol: 224 mg/dL I have reviewed the PMH, Fam and Soc history. Patient Active Problem List   Diagnosis Date Noted   GAD (generalized anxiety disorder) 10/20/2019    Priority: 1.   Major depression, recurrent, chronic ([place]) 10/20/2019    Priority: 1.   Diet-controlled diabetes mellitus ([place]) 10/20/2019    Priority: 1.   Essential tremor 04/13/2019    Priority: 1.   Acquired hypothyroidism 05/24/2012    Priority: 1.   Alcohol abuse, daily use 05/24/2012    Priority: 1.   Essential hypertension     Priority: 1.   Mixed hyperlipidemia     Priority: 1.   Osteoarthritis, multiple sites 10/20/2019    Priority: 2.    Neck, back, hands, shoulders, knees.  He has seen [facility] in the past    Bilateral primary osteoarthritis of knee 09/23/2017    Priority: 2.   Vitamin B12 deficiency 10/20/2019    Priority: 3.   Presbycusis of both ears 04/13/2019    Priority: 3.   Pancreatic adenocarcinoma ([place]) 09/25/2020    Priority: 1.   Primary  pancreatic cancer with metastasis to other site [facility] 09/12/2020    Priority: 1.   Hyperbilirubinemia 09/24/2020    Social History: Patient  reports that she quit smoking about 33 years ago. Her smoking use included cigarettes. She has a 10.00 pack-year smoking history. She has never used smokeless tobacco. She reports current alcohol use of about 21.0 standard drinks per week. She reports that she does not use drugs.  Review of Systems: Ophthalmic: negative for eye pain, loss of vision or double vision Cardiovascular: negative for chest pain Respiratory: negative for SOB or persistent cough Gastrointestinal: negative for abdominal pain Genitourinary: negative for dysuria or gross hematuria MSK: negative for foot lesions Neurologic: negative for weakness or gait disturbance  Objective  Vitals: BP 130/82   Pulse Marland Kitchen)  106   Temp 98.4 F (36.9 C) (Temporal)   Ht 5\' 5"  (1.651 m)   Wt 153 lb 6.4 oz (69.6 kg)   SpO2 99%   BMI 25.53 kg/m  General:  no acute distress, appears frail Psych:  Alert and oriented, normal mood and affect Cardiovascular:  Nl S1 and S2, RRR without murmur, gallop or rub. no edema Respiratory:  Good breath sounds bilaterally, CTAB with normal effort, no rales Gastrointestinal: normal BS, soft, nontender     Diabetic education: ongoing education regarding chronic disease management for diabetes was given today. We continue to reinforce the ABC's of diabetic management: A1c (<7 or 8 dependent upon patient), tight blood pressure control, and cholesterol management with goal LDL < 100 minimally. We discuss diet strategies, exercise recommendations, medication options and possible side effects. At each visit, we review recommended immunizations and preventive care recommendations for diabetics and stress that good diabetic control can prevent other problems. See below for this patient's data.   Commons side effects, risks, benefits, and alternatives for medications  and treatment plan prescribed today were discussed, and the patient expressed understanding of the given instructions. Patient is instructed to call or message via MyChart if he/she has any questions or concerns regarding our treatment plan. No barriers to understanding were identified. We discussed Red Flag symptoms and signs in detail. Patient expressed understanding regarding what to do in case of urgent or emergency type symptoms.  Medication list was reconciled, printed and provided to the patient in AVS. Patient instructions and summary information was reviewed with the patient as documented in the AVS. This note was prepared with assistance of Dragon voice recognition software. Occasional wrong-word or sound-a-like substitutions may have occurred due to the inherent limitations of voice recognition software  This visit occurred during the SARS-CoV-2 public health emergency.  Safety protocols were in place, including screening questions prior to the visit, additional usage of staff PPE, and extensive cleaning of exam room while observing appropriate contact time as indicated for disinfecting solutions.

## 2021-01-14 ENCOUNTER — Other Ambulatory Visit: Payer: Self-pay | Admitting: Oncology

## 2021-01-17 ENCOUNTER — Other Ambulatory Visit: Payer: Self-pay | Admitting: Oncology

## 2021-01-17 ENCOUNTER — Other Ambulatory Visit: Payer: Self-pay | Admitting: Family Medicine

## 2021-01-17 DIAGNOSIS — C259 Malignant neoplasm of pancreas, unspecified: Secondary | ICD-10-CM

## 2021-01-17 DIAGNOSIS — Z95828 Presence of other vascular implants and grafts: Secondary | ICD-10-CM

## 2021-01-18 ENCOUNTER — Inpatient Hospital Stay: Payer: Medicare HMO

## 2021-01-18 ENCOUNTER — Other Ambulatory Visit: Payer: Self-pay

## 2021-01-18 ENCOUNTER — Inpatient Hospital Stay: Payer: Medicare HMO | Attending: Hematology

## 2021-01-18 ENCOUNTER — Inpatient Hospital Stay (HOSPITAL_BASED_OUTPATIENT_CLINIC_OR_DEPARTMENT_OTHER): Payer: Medicare HMO | Admitting: Oncology

## 2021-01-18 ENCOUNTER — Telehealth: Payer: Self-pay

## 2021-01-18 VITALS — BP 129/74 | HR 81 | Temp 98.4°F | Resp 16 | Wt 153.2 lb

## 2021-01-18 DIAGNOSIS — I10 Essential (primary) hypertension: Secondary | ICD-10-CM | POA: Diagnosis not present

## 2021-01-18 DIAGNOSIS — N390 Urinary tract infection, site not specified: Secondary | ICD-10-CM | POA: Insufficient documentation

## 2021-01-18 DIAGNOSIS — Z5111 Encounter for antineoplastic chemotherapy: Secondary | ICD-10-CM | POA: Insufficient documentation

## 2021-01-18 DIAGNOSIS — L97519 Non-pressure chronic ulcer of other part of right foot with unspecified severity: Secondary | ICD-10-CM | POA: Insufficient documentation

## 2021-01-18 DIAGNOSIS — C259 Malignant neoplasm of pancreas, unspecified: Secondary | ICD-10-CM

## 2021-01-18 DIAGNOSIS — E119 Type 2 diabetes mellitus without complications: Secondary | ICD-10-CM | POA: Insufficient documentation

## 2021-01-18 DIAGNOSIS — Z452 Encounter for adjustment and management of vascular access device: Secondary | ICD-10-CM | POA: Insufficient documentation

## 2021-01-18 DIAGNOSIS — G893 Neoplasm related pain (acute) (chronic): Secondary | ICD-10-CM | POA: Diagnosis not present

## 2021-01-18 DIAGNOSIS — G629 Polyneuropathy, unspecified: Secondary | ICD-10-CM | POA: Insufficient documentation

## 2021-01-18 DIAGNOSIS — C251 Malignant neoplasm of body of pancreas: Secondary | ICD-10-CM | POA: Insufficient documentation

## 2021-01-18 DIAGNOSIS — M1990 Unspecified osteoarthritis, unspecified site: Secondary | ICD-10-CM | POA: Diagnosis not present

## 2021-01-18 DIAGNOSIS — Z95828 Presence of other vascular implants and grafts: Secondary | ICD-10-CM

## 2021-01-18 LAB — CMP (CANCER CENTER ONLY)
ALT: 22 U/L (ref 0–44)
AST: 19 U/L (ref 15–41)
Albumin: 4.1 g/dL (ref 3.5–5.0)
Alkaline Phosphatase: 87 U/L (ref 38–126)
Anion gap: 10 (ref 5–15)
BUN: 16 mg/dL (ref 8–23)
CO2: 22 mmol/L (ref 22–32)
Calcium: 9.6 mg/dL (ref 8.9–10.3)
Chloride: 105 mmol/L (ref 98–111)
Creatinine: 0.78 mg/dL (ref 0.44–1.00)
GFR, Estimated: >60 mL/min (ref >60)
Glucose, Bld: 160 mg/dL — ABNORMAL HIGH (ref 70–99)
Potassium: 4.9 mmol/L (ref 3.5–5.1)
Sodium: 137 mmol/L (ref 135–145)
Total Bilirubin: 0.4 mg/dL (ref 0.3–1.2)
Total Protein: 6.9 g/dL (ref 6.5–8.1)

## 2021-01-18 LAB — URINALYSIS, COMPLETE (UACMP) WITH MICROSCOPIC
Bilirubin Urine: NEGATIVE
Glucose, UA: NEGATIVE mg/dL
Hgb urine dipstick: NEGATIVE
Ketones, ur: NEGATIVE mg/dL
Nitrite: NEGATIVE
Protein, ur: NEGATIVE mg/dL
Specific Gravity, Urine: 1.018 (ref 1.005–1.030)
WBC, UA: >50 WBC/hpf — ABNORMAL HIGH (ref 0–5)
pH: 5.5 (ref 5.0–8.0)

## 2021-01-18 LAB — CBC WITH DIFFERENTIAL (CANCER CENTER ONLY)
Abs Immature Granulocytes: 0.04 10*3/uL (ref 0.00–0.07)
Basophils Absolute: 0.1 10*3/uL (ref 0.0–0.1)
Basophils Relative: 1 %
Eosinophils Absolute: 0.1 10*3/uL (ref 0.0–0.5)
Eosinophils Relative: 1 %
HCT: 40.7 % (ref 36.0–46.0)
Hemoglobin: 13.5 g/dL (ref 12.0–15.0)
Immature Granulocytes: 0 %
Lymphocytes Relative: 33 %
Lymphs Abs: 3 10*3/uL (ref 0.7–4.0)
MCH: 32.1 pg (ref 26.0–34.0)
MCHC: 33.2 g/dL (ref 30.0–36.0)
MCV: 96.7 fL (ref 80.0–100.0)
Monocytes Absolute: 0.7 10*3/uL (ref 0.1–1.0)
Monocytes Relative: 8 %
Neutro Abs: 5.3 10*3/uL (ref 1.7–7.7)
Neutrophils Relative %: 57 %
Platelet Count: 159 10*3/uL (ref 150–400)
RBC: 4.21 MIL/uL (ref 3.87–5.11)
RDW: 16.1 % — ABNORMAL HIGH (ref 11.5–15.5)
WBC Count: 9.3 10*3/uL (ref 4.0–10.5)
nRBC: 0 % (ref 0.0–0.2)

## 2021-01-18 MED ORDER — HEPARIN SOD (PORK) LOCK FLUSH 100 UNIT/ML IV SOLN: 500.0000 [IU] | Freq: Once | INTRAVENOUS | Status: DC

## 2021-01-18 MED ORDER — SODIUM CHLORIDE 0.9% FLUSH: 10.0000 mL | Freq: Once | INTRAVENOUS | Status: DC

## 2021-01-18 MED ORDER — CIPROFLOXACIN HCL 500 MG PO TABS: 500.0000 mg | ORAL_TABLET | Freq: Two times a day (BID) | ORAL | 0 refills | Status: DC

## 2021-01-18 NOTE — Telephone Encounter (Signed)
Informed Pt prescription was sent to the pharmacy. Pt verbalized understanding.

## 2021-01-18 NOTE — Telephone Encounter (Signed)
-----   Message from Ladell Pier, MD sent at 01/18/2021  3:12 PM EDT ----- Please call patient, urinalysis is consistent with an infection, and culture, start ciprofloxacin 500 mg twice daily for 5 days

## 2021-01-18 NOTE — Progress Notes (Signed)
[name]   Diagnosis: Pancreas cancer  INTERVAL HISTORY:   [name] returns as scheduled.  She completed a treatment with gemcitabine on 01/05/2021.  She tolerated the treatment well.  Neuropathy symptoms have improved.  She reports chronic diminished grip strength in her hands-predating chemotherapy.  No foot numbness.  She has dysuria and vaginal dryness.  No abdominal pain.  Objective:  Vital signs in last 24 hours:  Blood pressure 129/74, pulse 81, temperature 98.4 F (36.9 C), temperature source Oral, resp. rate 16, weight 153 lb 3.2 oz (69.5 kg), SpO2 100 %.    HEENT: No thrush or ulcers Resp: Lungs clear bilaterally Cardio: Regular rate and rhythm GI: No hepatosplenomegaly, no mass, nontender Vascular: No leg edema  Portacath/PICC-without erythema  Lab Results:  Lab Results  Component Value Date   WBC 9.3 01/18/2021   HGB 13.5 01/18/2021   HCT 40.7 01/18/2021   MCV 96.7 01/18/2021   PLT 159 01/18/2021   NEUTROABS 5.3 01/18/2021    CMP  Lab Results  Component Value Date   NA 136 01/04/2021   K 3.7 01/04/2021   CL 104 01/04/2021   CO2 21 (L) 01/04/2021   GLUCOSE 143 (H) 01/04/2021   BUN 16 01/04/2021   CREATININE 0.76 01/04/2021   CALCIUM 9.2 01/04/2021   PROT 6.6 01/04/2021   ALBUMIN 4.1 01/04/2021   AST 17 01/04/2021   ALT 23 01/04/2021   ALKPHOS 76 01/04/2021   BILITOT 0.6 01/04/2021   GFRNONAA >60 01/04/2021   GFRAA 71 02/02/2019    Lab Results  Component Value Date   BPZ025 2,665 (H) 12/21/2020   Medications: I have reviewed the patient's current medications.   Assessment/Plan:  Adenocarcinoma the pancreas body, stage IV (cT4,cN0,pM1) Ultrasound abdomen 08/18/2020-possible hypoechoic pancreas body mass, small hypoechoic liver areas MRI abdomen 08/27/2020-pancreas body mass, multiple hepatic lesions consistent with metastases, right abdominal omental nodularity, pancreas mass extends to the celiac  bifurcation and abuts the splenic vein and splenoportal confluence CT chest 09/07/2020-scattered pulmonary nodules concerning for metastases, pancreas body mass, hepatic metastases Ultrasound-guided biopsy of a right liver lesion 09/08/2020-adenocarcinoma consistent with a pancreas primary CT abdomen/pelvis 09/24/2020-pancreas neck mass, omental and peritoneal nodularity-unchanged, multiple hypoenhancing liver masses-unchanged, numerous small pulmonary nodules Cycle 1 gemcitabine/Abraxane 10/04/2020 Cycle 2 gemcitabine/Abraxane 10/27/2020 Cycle 3 gemcitabine/Abraxane 11/11/2018 Cycle 4 gemcitabine/Abraxane 11/23/2020 Cycle 5 gemcitabine/Abraxane 12/08/2020 CT abdomen/pelvis 12/18/2020-stable pancreas mass, stable and improved liver lesions, resolution of omental soft tissue density, stable indeterminate lung nodules, no disease progression Cycle 6 gemcitabine/Abraxane 12/22/2020 Cycle 7 gemcitabine 01/05/2021, 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  Cycle 8 gemcitabine/Abraxane 01/19/2021   2.  Pain secondary #1 3.  Diabetes 4.  Hypertension 5.  Osteoarthritis 6.  Port-A-Cath placement 09/22/2020 7.  Admission with jaundice 09/24/2020.  MRI-new abrupt constriction of the common hepatic duct.  The infiltrative pancreatic mass extends medially in the porta hepatis to obstruct the common hepatic duct.  Multiple right hepatic lobe metastases mildly increased in size.  Stent placed into the common bile duct 09/28/2020.     Disposition: [name] appears stable.  Neuropathy symptoms have improved.  The plan is to resume treatment with gemcitabine and Abraxane tomorrow.  She will submit a urine sample again today.  She was treated for urinary tract infection a month.  She will return for an office visit in 2 weeks.  [name] will try a moisturizer for the vaginal dryness.  Betsy Coder, MD  01/18/2021  12:31 PM

## 2021-01-19 ENCOUNTER — Inpatient Hospital Stay: Payer: Medicare HMO

## 2021-01-19 VITALS — BP 116/79 | HR 87 | Temp 98.3°F | Resp 18

## 2021-01-19 DIAGNOSIS — M1990 Unspecified osteoarthritis, unspecified site: Secondary | ICD-10-CM | POA: Diagnosis not present

## 2021-01-19 DIAGNOSIS — Z5111 Encounter for antineoplastic chemotherapy: Secondary | ICD-10-CM | POA: Diagnosis not present

## 2021-01-19 DIAGNOSIS — L97519 Non-pressure chronic ulcer of other part of right foot with unspecified severity: Secondary | ICD-10-CM | POA: Diagnosis not present

## 2021-01-19 DIAGNOSIS — G629 Polyneuropathy, unspecified: Secondary | ICD-10-CM | POA: Diagnosis not present

## 2021-01-19 DIAGNOSIS — C251 Malignant neoplasm of body of pancreas: Secondary | ICD-10-CM | POA: Diagnosis not present

## 2021-01-19 DIAGNOSIS — G893 Neoplasm related pain (acute) (chronic): Secondary | ICD-10-CM | POA: Diagnosis not present

## 2021-01-19 DIAGNOSIS — I10 Essential (primary) hypertension: Secondary | ICD-10-CM | POA: Diagnosis not present

## 2021-01-19 DIAGNOSIS — C259 Malignant neoplasm of pancreas, unspecified: Secondary | ICD-10-CM

## 2021-01-19 DIAGNOSIS — E119 Type 2 diabetes mellitus without complications: Secondary | ICD-10-CM | POA: Diagnosis not present

## 2021-01-19 DIAGNOSIS — Z452 Encounter for adjustment and management of vascular access device: Secondary | ICD-10-CM | POA: Diagnosis not present

## 2021-01-19 LAB — CANCER ANTIGEN 19-9: CA 19-9: 1974 U/mL — ABNORMAL HIGH (ref 0–35)

## 2021-01-19 MED ORDER — PROCHLORPERAZINE MALEATE 10 MG PO TABS
10.0000 mg | ORAL_TABLET | Freq: Once | ORAL | Status: AC
  Administered 2021-01-19: 10 mg via ORAL
  Filled 2021-01-19: qty 1

## 2021-01-19 MED ORDER — SODIUM CHLORIDE 0.9 % IV SOLN: INTRAVENOUS | Status: AC

## 2021-01-19 MED ORDER — SODIUM CHLORIDE 0.9 % IV SOLN: Freq: Once | INTRAVENOUS | Status: AC

## 2021-01-19 MED ORDER — PACLITAXEL PROTEIN-BOUND CHEMO INJECTION 100 MG
100.0000 mg/m2 | Freq: Once | INTRAVENOUS | Status: AC
  Administered 2021-01-19: 175 mg via INTRAVENOUS
  Filled 2021-01-19: qty 35

## 2021-01-19 MED ORDER — SODIUM CHLORIDE 0.9 % IV SOLN
800.0000 mg/m2 | Freq: Once | INTRAVENOUS | Status: AC
  Administered 2021-01-19: 1444 mg via INTRAVENOUS
  Filled 2021-01-19: qty 26.3

## 2021-01-19 MED ORDER — SODIUM CHLORIDE 0.9% FLUSH
10.0000 mL | INTRAVENOUS | Status: DC | PRN
  Administered 2021-01-19: 10 mL

## 2021-01-19 MED ORDER — HEPARIN SOD (PORK) LOCK FLUSH 100 UNIT/ML IV SOLN
500.0000 [IU] | Freq: Once | INTRAVENOUS | Status: AC | PRN
  Administered 2021-01-19: 500 [IU]

## 2021-01-19 NOTE — Progress Notes (Signed)
Patient presents for treatment. RN assessment completed along with the following:  Labs/vitals reviewed - Yes, and within treatment parameters.   Weight within 10% of previous measurement - Yes Oncology Treatment Attestation completed for current therapy- Yes, on date 09/12/20 Informed consent completed and reflects current therapy/intent -  10/12/20             Provider progress note reviewed - Patient not seen by provider today. Most recent note dated 01/18/21 reviewed. Treatment/Antibody/Supportive plan reviewed - Yes, and there are no adjustments needed for today's treatment. S&H and other orders reviewed - Yes, and there are no additional orders identified. Previous treatment date reviewed - Yes, and the appropriate amount of time has elapsed between treatments. Clinic Hand Off Received from - none   Patient to proceed with treatment.

## 2021-01-19 NOTE — Patient Instructions (Signed)
[facility]  Discharge Instructions: Thank you for choosing [facility] to provide your oncology and hematology care.   If you have a lab appointment with the [facility], please go directly to the [facility] and check in at the registration area.   Wear comfortable clothing and clothing appropriate for easy access to any Portacath or PICC line.   We strive to give you quality time with your provider. You may need to reschedule your appointment if you arrive late (15 or more minutes).  Arriving late affects you and other patients whose appointments are after yours.  Also, if you miss three or more appointments without notifying the office, you may be dismissed from the clinic at the provider's discretion.      For prescription refill requests, have your pharmacy contact our office and allow 72 hours for refills to be completed.    Today you received the following chemotherapy and/or immunotherapy agents Abraxane, Gemzar      To help prevent nausea and vomiting after your treatment, we encourage you to take your nausea medication as directed.  BELOW ARE SYMPTOMS THAT SHOULD BE REPORTED IMMEDIATELY: *FEVER GREATER THAN 100.4 F (38 C) OR HIGHER *CHILLS OR SWEATING *NAUSEA AND VOMITING THAT IS NOT CONTROLLED WITH YOUR NAUSEA MEDICATION *UNUSUAL SHORTNESS OF BREATH *UNUSUAL BRUISING OR BLEEDING *URINARY PROBLEMS (pain or burning when urinating, or frequent urination) *BOWEL PROBLEMS (unusual diarrhea, constipation, pain near the anus) TENDERNESS IN MOUTH AND THROAT WITH OR WITHOUT PRESENCE OF ULCERS (sore throat, sores in mouth, or a toothache) UNUSUAL RASH, SWELLING OR PAIN  UNUSUAL VAGINAL DISCHARGE OR ITCHING   Items with * indicate a potential emergency and should be followed up as soon as possible or go to the Emergency Department if any problems should occur.  Please show the CHEMOTHERAPY ALERT CARD or IMMUNOTHERAPY ALERT CARD at check-in  to the Emergency Department and triage nurse.  Should you have questions after your visit or need to cancel or reschedule your appointment, please contact [facility]  Dept: [phone]  and follow the prompts.  Office hours are 8:00 a.m. to 4:30 p.m. Monday - Friday. Please note that voicemails left after 4:00 p.m. may not be returned until the following business day.  We are closed weekends and major holidays. You have access to a nurse at all times for urgent questions. Please call the main number to the clinic Dept: [phone] and follow the prompts.   For any non-urgent questions, you may also contact your provider using MyChart. We now offer e-Visits for anyone 15 and older to request care online for non-urgent symptoms. For details visit [email or web address].   Also download the MyChart app! Go to the app store, search "MyChart", open the app, select [facility], and log in with your MyChart username and password.  Due to Covid, a mask is required upon entering the hospital/clinic. If you do not have a mask, one will be given to you upon arrival. For doctor visits, patients may have 1 support person aged 77 or older with them. For treatment visits, patients cannot have anyone with them due to current Covid guidelines and our immunocompromised population.   Nanoparticle Albumin-Bound Paclitaxel injection What is this medication? NANOPARTICLE ALBUMIN-BOUND PACLITAXEL (Na no PAHR ti kuhl  al BYOO muhn-bound  PAK li TAX el) is a chemotherapy drug. It targets fast dividing cells, like cancer cells, and causes these cells to die. This medicine is used to  treat advanced breast cancer, lung cancer, and pancreatic cancer. This medicine may be used for other purposes; ask your health care provider or pharmacist if you have questions. COMMON BRAND NAME(S): Abraxane What should I tell my care team before I take this medication? They need to know if you have any of these  conditions: kidney disease liver disease low blood counts, like low white cell, platelet, or red cell counts lung or breathing disease, like asthma tingling of the fingers or toes, or other nerve disorder an unusual or allergic reaction to paclitaxel, albumin, other chemotherapy, other medicines, foods, dyes, or preservatives pregnant or trying to get pregnant breast-feeding How should I use this medication? This drug is given as an infusion into a vein. It is administered in a hospital or clinic by a specially trained health care professional. Talk to your pediatrician regarding the use of this medicine in children. Special care may be needed. Overdosage: If you think you have taken too much of this medicine contact a poison control center or emergency room at once. NOTE: This medicine is only for you. Do not share this medicine with others. What if I miss a dose? It is important not to miss your dose. Call your doctor or health care professional if you are unable to keep an appointment. What may interact with this medication? This medicine may interact with the following medications: antiviral medicines for hepatitis, HIV or AIDS certain antibiotics like erythromycin and clarithromycin certain medicines for fungal infections like ketoconazole and itraconazole certain medicines for seizures like carbamazepine, phenobarbital, phenytoin gemfibrozil nefazodone rifampin St. John's wort This list may not describe all possible interactions. Give your health care provider a list of all the medicines, herbs, non-prescription drugs, or dietary supplements you use. Also tell them if you smoke, drink alcohol, or use illegal drugs. Some items may interact with your medicine. What should I watch for while using this medication? Your condition will be monitored carefully while you are receiving this medicine. You will need important blood work done while you are taking this medicine. This medicine  can cause serious allergic reactions. If you experience allergic reactions like skin rash, itching or hives, swelling of the face, lips, or tongue, tell your doctor or health care professional right away. In some cases, you may be given additional medicines to help with side effects. Follow all directions for their use. This drug may make you feel generally unwell. This is not uncommon, as chemotherapy can affect healthy cells as well as cancer cells. Report any side effects. Continue your course of treatment even though you feel ill unless your doctor tells you to stop. Call your doctor or health care professional for advice if you get a fever, chills or sore throat, or other symptoms of a cold or flu. Do not treat yourself. This drug decreases your body's ability to fight infections. Try to avoid being around people who are sick. This medicine may increase your risk to bruise or bleed. Call your doctor or health care professional if you notice any unusual bleeding. Be careful brushing and flossing your teeth or using a toothpick because you may get an infection or bleed more easily. If you have any dental work done, tell your dentist you are receiving this medicine. Avoid taking products that contain aspirin, acetaminophen, ibuprofen, naproxen, or ketoprofen unless instructed by your doctor. These medicines may hide a fever. Do not become pregnant while taking this medicine or for 6 months after stopping it. Women should inform  their doctor if they wish to become pregnant or think they might be pregnant. Men should not father a child while taking this medicine or for 3 months after stopping it. There is a potential for serious side effects to an unborn child. Talk to your health care professional or pharmacist for more information. Do not breast-feed an infant while taking this medicine or for 2 weeks after stopping it. This medicine may interfere with the ability to get pregnant or to father a child. You  should talk to your doctor or health care professional if you are concerned about your fertility. What side effects may I notice from receiving this medication? Side effects that you should report to your doctor or health care professional as soon as possible: allergic reactions like skin rash, itching or hives, swelling of the face, lips, or tongue breathing problems changes in vision fast, irregular heartbeat low blood pressure mouth sores pain, tingling, numbness in the hands or feet signs of decreased platelets or bleeding - bruising, pinpoint red spots on the skin, black, tarry stools, blood in the urine signs of decreased red blood cells - unusually weak or tired, feeling faint or lightheaded, falls signs of infection - fever or chills, cough, sore throat, pain or difficulty passing urine signs and symptoms of liver injury like dark yellow or brown urine; general ill feeling or flu-like symptoms; light-colored stools; loss of appetite; nausea; right upper belly pain; unusually weak or tired; yellowing of the eyes or skin swelling of the ankles, feet, hands unusually slow heartbeat Side effects that usually do not require medical attention (report to your doctor or health care professional if they continue or are bothersome): diarrhea hair loss loss of appetite nausea, vomiting tiredness This list may not describe all possible side effects. Call your doctor for medical advice about side effects. You may report side effects to FDA at 1-800-FDA-1088. Where should I keep my medication? This drug is given in a hospital or clinic and will not be stored at home. NOTE: This sheet is a summary. It may not cover all possible information. If you have questions about this medicine, talk to your doctor, pharmacist, or health care provider.  2022 Elsevier/Gold Standard (2016-11-05 00:00:00)  Gemcitabine injection What is this medication? GEMCITABINE (jem SYE ta been) is a chemotherapy drug.  This medicine is used to treat many types of cancer like breast cancer, lung cancer, pancreatic cancer, and ovarian cancer. This medicine may be used for other purposes; ask your health care provider or pharmacist if you have questions. COMMON BRAND NAME(S): Gemzar, Infugem What should I tell my care team before I take this medication? They need to know if you have any of these conditions: blood disorders infection kidney disease liver disease lung or breathing disease, like asthma recent or ongoing radiation therapy an unusual or allergic reaction to gemcitabine, other chemotherapy, other medicines, foods, dyes, or preservatives pregnant or trying to get pregnant breast-feeding How should I use this medication? This drug is given as an infusion into a vein. It is administered in a hospital or clinic by a specially trained health care professional. Talk to your pediatrician regarding the use of this medicine in children. Special care may be needed. Overdosage: If you think you have taken too much of this medicine contact a poison control center or emergency room at once. NOTE: This medicine is only for you. Do not share this medicine with others. What if I miss a dose? It is important not to  miss your dose. Call your doctor or health care professional if you are unable to keep an appointment. What may interact with this medication? medicines to increase blood counts like filgrastim, pegfilgrastim, sargramostim some other chemotherapy drugs like cisplatin vaccines Talk to your doctor or health care professional before taking any of these medicines: acetaminophen aspirin ibuprofen ketoprofen naproxen This list may not describe all possible interactions. Give your health care provider a list of all the medicines, herbs, non-prescription drugs, or dietary supplements you use. Also tell them if you smoke, drink alcohol, or use illegal drugs. Some items may interact with your medicine. What  should I watch for while using this medication? Visit your doctor for checks on your progress. This drug may make you feel generally unwell. This is not uncommon, as chemotherapy can affect healthy cells as well as cancer cells. Report any side effects. Continue your course of treatment even though you feel ill unless your doctor tells you to stop. In some cases, you may be given additional medicines to help with side effects. Follow all directions for their use. Call your doctor or health care professional for advice if you get a fever, chills or sore throat, or other symptoms of a cold or flu. Do not treat yourself. This drug decreases your body's ability to fight infections. Try to avoid being around people who are sick. This medicine may increase your risk to bruise or bleed. Call your doctor or health care professional if you notice any unusual bleeding. Be careful brushing and flossing your teeth or using a toothpick because you may get an infection or bleed more easily. If you have any dental work done, tell your dentist you are receiving this medicine. Avoid taking products that contain aspirin, acetaminophen, ibuprofen, naproxen, or ketoprofen unless instructed by your doctor. These medicines may hide a fever. Do not become pregnant while taking this medicine or for 6 months after stopping it. Women should inform their doctor if they wish to become pregnant or think they might be pregnant. Men should not father a child while taking this medicine and for 3 months after stopping it. There is a potential for serious side effects to an unborn child. Talk to your health care professional or pharmacist for more information. Do not breast-feed an infant while taking this medicine or for at least 1 week after stopping it. Men should inform their doctors if they wish to father a child. This medicine may lower sperm counts. Talk with your doctor or health care professional if you are concerned about your  fertility. What side effects may I notice from receiving this medication? Side effects that you should report to your doctor or health care professional as soon as possible: allergic reactions like skin rash, itching or hives, swelling of the face, lips, or tongue breathing problems pain, redness, or irritation at site where injected signs and symptoms of a dangerous change in heartbeat or heart rhythm like chest pain; dizziness; fast or irregular heartbeat; palpitations; feeling faint or lightheaded, falls; breathing problems signs of decreased platelets or bleeding - bruising, pinpoint red spots on the skin, black, tarry stools, blood in the urine signs of decreased red blood cells - unusually weak or tired, feeling faint or lightheaded, falls signs of infection - fever or chills, cough, sore throat, pain or difficulty passing urine signs and symptoms of kidney injury like trouble passing urine or change in the amount of urine signs and symptoms of liver injury like dark yellow or brown  urine; general ill feeling or flu-like symptoms; light-colored stools; loss of appetite; nausea; right upper belly pain; unusually weak or tired; yellowing of the eyes or skin swelling of ankles, feet, hands Side effects that usually do not require medical attention (report to your doctor or health care professional if they continue or are bothersome): constipation diarrhea hair loss loss of appetite nausea rash vomiting This list may not describe all possible side effects. Call your doctor for medical advice about side effects. You may report side effects to FDA at 1-800-FDA-1088. Where should I keep my medication? This drug is given in a hospital or clinic and will not be stored at home. NOTE: This sheet is a summary. It may not cover all possible information. If you have questions about this medicine, talk to your doctor, pharmacist, or health care provider.  2022 Elsevier/Gold Standard (2017-05-28  00:00:00)  Rehydration, Adult Rehydration is the replacement of body fluids, salts, and minerals (electrolytes) that are lost during dehydration. Dehydration is when there is not enough water or other fluids in the body. This happens when you lose more fluids than you take in. Common causes of dehydration include: Not drinking enough fluids. This can occur when you are ill or doing activities that require a lot of energy, especially in hot weather. Conditions that cause loss of water or other fluids, such as diarrhea, vomiting, sweating, or urinating a lot. Other illnesses, such as fever or infection. Certain medicines, such as those that remove excess fluid from the body (diuretics). Symptoms of mild or moderate dehydration may include thirst, dry lips and mouth, and dizziness. Symptoms of severe dehydration may include increased heart rate, confusion, fainting, and not urinating. For severe dehydration, you may need to get fluids through an IV at the hospital. For mild or moderate dehydration, you can usually rehydrate at home by drinking certain fluids as told by your health care provider. What are the risks? Generally, rehydration is safe. However, taking in too much fluid (overhydration) can be a problem. This is rare. Overhydration can cause an electrolyte imbalance, kidney failure, or a decrease in salt (sodium) levels in the body. Supplies needed You will need an oral rehydration solution (ORS) if your health care provider tells you to use one. This is a drink to treat dehydration. It can be found in pharmacies and retail stores. How to rehydrate Fluids Follow instructions from your health care provider for rehydration. The kind of fluid and the amount you should drink depend on your condition. In general, you should choose drinks that you prefer. If told by your health care provider, drink an ORS. Make an ORS by following instructions on the package. Start by drinking small amounts, about   cup (120 mL) every 5-10 minutes. Slowly increase how much you drink until you have taken the amount recommended by your health care provider. Drink enough clear fluids to keep your urine pale yellow. If you were told to drink an ORS, finish it first, then start slowly drinking other clear fluids. Drink fluids such as: Water. This includes sparkling water and flavored water. Drinking only water can lead to having too little sodium in your body (hyponatremia). Follow the advice of your health care provider. Water from ice chips you suck on. Fruit juice with water you add to it (diluted). Sports drinks. Hot or cold herbal teas. Broth-based soups. Milk or milk products. Food Follow instructions from your health care provider about what to eat while you rehydrate. Your health care provider  may recommend that you slowly begin eating regular foods in small amounts. Eat foods that contain a healthy balance of electrolytes, such as bananas, oranges, potatoes, tomatoes, and spinach. Avoid foods that are greasy or contain a lot of sugar. In some cases, you may get nutrition through a feeding tube that is passed through your nose and into your stomach (nasogastric tube, or NG tube). This may be done if you have uncontrolled vomiting or diarrhea. Beverages to avoid Certain beverages may make dehydration worse. While you rehydrate, avoid drinking alcohol. How to tell if you are recovering from dehydration You may be recovering from dehydration if: You are urinating more often than before you started rehydrating. Your urine is pale yellow. Your energy level improves. You vomit less frequently. You have diarrhea less frequently. Your appetite improves or returns to normal. You feel less dizzy or less light-headed. Your skin tone and color start to look more normal. Follow these instructions at home: Take over-the-counter and prescription medicines only as told by your health care provider. Do not take  sodium tablets. Doing this can lead to having too much sodium in your body (hypernatremia). Contact a health care provider if: You continue to have symptoms of mild or moderate dehydration, such as: Thirst. Dry lips. Slightly dry mouth. Dizziness. Dark urine or less urine than normal. Muscle cramps. You continue to vomit or have diarrhea. Get help right away if you: Have symptoms of dehydration that get worse. Have a fever. Have a severe headache. Have been vomiting and the following happens: Your vomiting gets worse or does not go away. Your vomit includes blood or green matter (bile). You cannot eat or drink without vomiting. Have problems with urination or bowel movements, such as: Diarrhea that gets worse or does not go away. Blood in your stool (feces). This may cause stool to look black and tarry. Not urinating, or urinating only a small amount of very dark urine, within 6-8 hours. Have trouble breathing. Have symptoms that get worse with treatment. These symptoms may represent a serious problem that is an emergency. Do not wait to see if the symptoms will go away. Get medical help right away. Call your local emergency services (911 in the [place] Do not drive yourself to the hospital. Summary Rehydration is the replacement of body fluids and minerals (electrolytes) that are lost during dehydration. Follow instructions from your health care provider for rehydration. The kind of fluid and amount you should drink depend on your condition. Slowly increase how much you drink until you have taken the amount recommended by your health care provider. Contact your health care provider if you continue to show signs of mild or moderate dehydration. This information is not intended to replace advice given to you by your health care provider. Make sure you discuss any questions you have with your health care provider. Document Revised: 05/05/2019 Document Reviewed: 03/15/2019 Elsevier  Patient Education  2022 [employer].

## 2021-01-20 LAB — URINE CULTURE: Culture: 50000 — AB

## 2021-01-22 ENCOUNTER — Other Ambulatory Visit: Payer: Self-pay

## 2021-01-22 ENCOUNTER — Telehealth: Payer: Self-pay

## 2021-01-22 MED ORDER — SULFAMETHOXAZOLE-TRIMETHOPRIM 800-160 MG PO TABS: 1.0000 | ORAL_TABLET | Freq: Two times a day (BID) | ORAL | 0 refills | Status: DC

## 2021-01-22 NOTE — Telephone Encounter (Signed)
TC from Pt stating she was given cipro for and will not take this medication. Pt stated she would like to take Bactrim even though it caused her to have diarrhea. Informed Pt that the cipro is the better choice for her but Pt stated she would just like to take what she was given before. Pt also stated she was feeling nauseous. Asked Pt if she was taking nausea medication Pt stated she was not taking her nausea medication. Informed Pt to start taking her nausea medication to see how she feels. Discussed with Dr Benay Spice prescription sent to pharmacy for Bactrim. TC to Pt to inform her it was sent. Inquired how she was feeling. Pt stated she was feeling better.

## 2021-01-23 ENCOUNTER — Ambulatory Visit: Payer: Medicare HMO

## 2021-01-25 ENCOUNTER — Ambulatory Visit (INDEPENDENT_AMBULATORY_CARE_PROVIDER_SITE_OTHER): Payer: Medicare HMO

## 2021-01-25 ENCOUNTER — Other Ambulatory Visit: Payer: Self-pay

## 2021-01-25 DIAGNOSIS — E538 Deficiency of other specified B group vitamins: Secondary | ICD-10-CM | POA: Diagnosis not present

## 2021-01-25 MED ORDER — CYANOCOBALAMIN 1000 MCG/ML IJ SOLN
1000.0000 ug | Freq: Once | INTRAMUSCULAR | Status: AC
  Administered 2021-01-25: 1000 ug via INTRAMUSCULAR

## 2021-01-25 NOTE — Progress Notes (Addendum)
Per orders of Dr. Jonni Sanger , injection of B-12 given by Tobe Sos in right deltoid. Patient tolerated injection well. Patient will make appointment for 1 month.

## 2021-01-28 ENCOUNTER — Other Ambulatory Visit: Payer: Self-pay | Admitting: Oncology

## 2021-01-30 ENCOUNTER — Ambulatory Visit: Payer: Medicare HMO

## 2021-01-30 ENCOUNTER — Other Ambulatory Visit: Payer: Medicare HMO

## 2021-02-01 ENCOUNTER — Encounter: Payer: Self-pay | Admitting: Oncology

## 2021-02-01 ENCOUNTER — Telehealth (HOSPITAL_COMMUNITY): Payer: Self-pay | Admitting: Dietician

## 2021-02-01 ENCOUNTER — Inpatient Hospital Stay: Payer: Medicare HMO

## 2021-02-01 ENCOUNTER — Encounter (HOSPITAL_COMMUNITY): Payer: Medicare HMO | Admitting: Dietician

## 2021-02-01 ENCOUNTER — Other Ambulatory Visit: Payer: Self-pay

## 2021-02-01 ENCOUNTER — Inpatient Hospital Stay: Payer: Medicare HMO | Admitting: Oncology

## 2021-02-01 VITALS — BP 119/74 | HR 96 | Temp 98.0°F | Resp 20 | Ht 65.0 in | Wt 151.6 lb

## 2021-02-01 DIAGNOSIS — C259 Malignant neoplasm of pancreas, unspecified: Secondary | ICD-10-CM

## 2021-02-01 DIAGNOSIS — L97519 Non-pressure chronic ulcer of other part of right foot with unspecified severity: Secondary | ICD-10-CM | POA: Diagnosis not present

## 2021-02-01 DIAGNOSIS — I10 Essential (primary) hypertension: Secondary | ICD-10-CM | POA: Diagnosis not present

## 2021-02-01 DIAGNOSIS — Z5111 Encounter for antineoplastic chemotherapy: Secondary | ICD-10-CM | POA: Diagnosis not present

## 2021-02-01 DIAGNOSIS — G893 Neoplasm related pain (acute) (chronic): Secondary | ICD-10-CM | POA: Diagnosis not present

## 2021-02-01 DIAGNOSIS — M1990 Unspecified osteoarthritis, unspecified site: Secondary | ICD-10-CM | POA: Diagnosis not present

## 2021-02-01 DIAGNOSIS — G629 Polyneuropathy, unspecified: Secondary | ICD-10-CM | POA: Diagnosis not present

## 2021-02-01 DIAGNOSIS — Z95828 Presence of other vascular implants and grafts: Secondary | ICD-10-CM

## 2021-02-01 DIAGNOSIS — C251 Malignant neoplasm of body of pancreas: Secondary | ICD-10-CM | POA: Diagnosis not present

## 2021-02-01 DIAGNOSIS — E119 Type 2 diabetes mellitus without complications: Secondary | ICD-10-CM | POA: Diagnosis not present

## 2021-02-01 DIAGNOSIS — Z452 Encounter for adjustment and management of vascular access device: Secondary | ICD-10-CM | POA: Diagnosis not present

## 2021-02-01 LAB — CBC WITH DIFFERENTIAL (CANCER CENTER ONLY)
Abs Immature Granulocytes: 0.01 10*3/uL (ref 0.00–0.07)
Basophils Absolute: 0 10*3/uL (ref 0.0–0.1)
Basophils Relative: 1 %
Eosinophils Absolute: 0.1 10*3/uL (ref 0.0–0.5)
Eosinophils Relative: 2 %
HCT: 40.3 % (ref 36.0–46.0)
Hemoglobin: 13.3 g/dL (ref 12.0–15.0)
Immature Granulocytes: 0 %
Lymphocytes Relative: 38 %
Lymphs Abs: 2.5 10*3/uL (ref 0.7–4.0)
MCH: 32.2 pg (ref 26.0–34.0)
MCHC: 33 g/dL (ref 30.0–36.0)
MCV: 97.6 fL (ref 80.0–100.0)
Monocytes Absolute: 0.6 10*3/uL (ref 0.1–1.0)
Monocytes Relative: 9 %
Neutro Abs: 3.4 10*3/uL (ref 1.7–7.7)
Neutrophils Relative %: 50 %
Platelet Count: 148 10*3/uL — ABNORMAL LOW (ref 150–400)
RBC: 4.13 MIL/uL (ref 3.87–5.11)
RDW: 15.6 % — ABNORMAL HIGH (ref 11.5–15.5)
WBC Count: 6.6 10*3/uL (ref 4.0–10.5)
nRBC: 0 % (ref 0.0–0.2)

## 2021-02-01 LAB — CMP (CANCER CENTER ONLY)
ALT: 19 U/L (ref 0–44)
AST: 17 U/L (ref 15–41)
Albumin: 4.4 g/dL (ref 3.5–5.0)
Alkaline Phosphatase: 77 U/L (ref 38–126)
Anion gap: 8 (ref 5–15)
BUN: 22 mg/dL (ref 8–23)
CO2: 22 mmol/L (ref 22–32)
Calcium: 9.5 mg/dL (ref 8.9–10.3)
Chloride: 103 mmol/L (ref 98–111)
Creatinine: 0.88 mg/dL (ref 0.44–1.00)
GFR, Estimated: >60 mL/min (ref >60)
Glucose, Bld: 144 mg/dL — ABNORMAL HIGH (ref 70–99)
Potassium: 3.8 mmol/L (ref 3.5–5.1)
Sodium: 133 mmol/L — ABNORMAL LOW (ref 135–145)
Total Bilirubin: 0.4 mg/dL (ref 0.3–1.2)
Total Protein: 7.2 g/dL (ref 6.5–8.1)

## 2021-02-01 MED ORDER — HEPARIN SOD (PORK) LOCK FLUSH 100 UNIT/ML IV SOLN
500.0000 [IU] | Freq: Once | INTRAVENOUS | Status: AC
  Administered 2021-02-01: 11:00:00 500 [IU] via INTRAVENOUS

## 2021-02-01 MED ORDER — SODIUM CHLORIDE 0.9% FLUSH
10.0000 mL | Freq: Once | INTRAVENOUS | Status: AC
  Administered 2021-02-01: 11:00:00 10 mL via INTRAVENOUS

## 2021-02-01 NOTE — Progress Notes (Signed)
[facility] OFFICE PROGRESS NOTE   Diagnosis: Pancreas cancer  INTERVAL HISTORY:   [name] completed another cycle of gemcitabine/Abraxane on 01/19/2021.  No pain.  No dysuria.  She has developed an ulcer at the right great toe.  Objective:  Vital signs in last 24 hours:  Blood pressure 119/74, pulse 96, temperature 98 F (36.7 C), temperature source Oral, resp. rate 20, height 5\' 5"  (1.651 m), weight 151 lb 9.6 oz (68.8 kg), SpO2 100 %.    HEENT: No thrush or ulcers Resp: Lungs clear bilaterally Cardio: Regular rate and rhythm GI: Nontender, no hepatosplenomegaly Vascular: No leg edema  Skin: 1 cm superficial ulceration at the medial aspect of the right great toe appears to be healing  Portacath/PICC-without erythema  Lab Results:  Lab Results  Component Value Date   WBC 6.6 02/01/2021   HGB 13.3 02/01/2021   HCT 40.3 02/01/2021   MCV 97.6 02/01/2021   PLT 148 (L) 02/01/2021   NEUTROABS 3.4 02/01/2021    CMP  Lab Results  Component Value Date   NA 133 (L) 02/01/2021   K 3.8 02/01/2021   CL 103 02/01/2021   CO2 22 02/01/2021   GLUCOSE 144 (H) 02/01/2021   BUN 22 02/01/2021   CREATININE 0.88 02/01/2021   CALCIUM 9.5 02/01/2021   PROT 7.2 02/01/2021   ALBUMIN 4.4 02/01/2021   AST 17 02/01/2021   ALT 19 02/01/2021   ALKPHOS 77 02/01/2021   BILITOT 0.4 02/01/2021   GFRNONAA >60 02/01/2021   GFRAA 71 02/02/2019    Lab Results  Component Value Date   DQQ229 1,974 (H) 01/18/2021     Medications: I have reviewed the patient's current medications.   Assessment/Plan: Adenocarcinoma the pancreas body, stage IV (cT4,cN0,pM1) Ultrasound abdomen 08/18/2020-possible hypoechoic pancreas body mass, small hypoechoic liver areas MRI abdomen 08/27/2020-pancreas body mass, multiple hepatic lesions consistent with metastases, right abdominal omental nodularity, pancreas mass extends to the celiac bifurcation and abuts the splenic vein and splenoportal  confluence CT chest 09/07/2020-scattered pulmonary nodules concerning for metastases, pancreas body mass, hepatic metastases Ultrasound-guided biopsy of a right liver lesion 09/08/2020-adenocarcinoma consistent with a pancreas primary CT abdomen/pelvis 09/24/2020-pancreas neck mass, omental and peritoneal nodularity-unchanged, multiple hypoenhancing liver masses-unchanged, numerous small pulmonary nodules Cycle 1 gemcitabine/Abraxane 10/04/2020 Cycle 2 gemcitabine/Abraxane 10/27/2020 Cycle 3 gemcitabine/Abraxane 11/11/2018 Cycle 4 gemcitabine/Abraxane 11/23/2020 Cycle 5 gemcitabine/Abraxane 12/08/2020 CT abdomen/pelvis 12/18/2020-stable pancreas mass, stable and improved liver lesions, resolution of omental soft tissue density, stable indeterminate lung nodules, no disease progression Cycle 6 gemcitabine/Abraxane 12/22/2020 Cycle 7 gemcitabine 01/05/2021, 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  Cycle 8 gemcitabine/Abraxane 01/19/2021 Cycle 9 gemcitabine/Abraxane 02/01/2021   2.  Pain secondary #1 3.  Diabetes 4.  Hypertension 5.  Osteoarthritis 6.  Port-A-Cath placement 09/22/2020 7.  Admission with jaundice 09/24/2020.  MRI-new abrupt constriction of the common hepatic duct.  The infiltrative pancreatic mass extends medially in the porta hepatis to obstruct the common hepatic duct.  Multiple right hepatic lobe metastases mildly increased in size.  Stent placed into the common bile duct 09/28/2020.      Disposition: Ms. Broerman appears stable.  She continues to tolerate the gemcitabine/Abraxane well.  The CA 19-9 was lower when she was here 2 weeks ago.  She will return tomorrow for the next cycle of gemcitabine/Abraxane.  Ms. Janusz will be scheduled for an office visit and chemotherapy in 2 weeks.  She will try a corn pad between the right toes.    Betsy Coder, MD  02/01/2021  11:56 AM

## 2021-02-01 NOTE — Telephone Encounter (Signed)
Nutrition Follow-up:  Patient receiving Gemcitabine and Abraxane for metastatic pancreatic cancer.   Spoke with patient via telephone. She reports receiving good news at MD appointment today, her tumor marker continues to decrease and appears to be responding to treatment. Patient has a good appetite and eating well except for the first 3 days after receiving chemotherapy. She reports worsening nausea after last treatment. Patient reports feeling better after taking compazine. She did not want to take extra medication, but this worked well for her. She is taking Colace for constipation, reports bowels have been more regular, had BM this morning. She does not like Ensure/Boost, states she could throw-up just looking at them. She continues to drink Fairlife milk, she likes the [employer] but has trouble finding them at the store. Patient is looking forward to Thanksgiving, they plan to go out this year and she does not have to cook.   Medications: reviewed  Labs: Na 133, Glucose 144  Anthropometrics: Weight 151 lb 9.6 oz today slightly decreased from 153 lb 3.2 oz on 11/3. Weights have remained stable x 7 weeks  10/27 - 153 lb 6.4 oz 10/20 - 154 lb 6.4 oz 10/7 - 153 lb 9.6 oz 9/29 - 152 lb 12.8 oz   NUTRITION DIAGNOSIS: Unintentional weight loss stable    INTERVENTION:  Continue strategies for increasing calories and protein with small frequent meals and snacks Reviewed foods with protein and encouraged protein source with all meals/snacks Discussed tips for nausea and encouraged to take nausea medication when needed  Continue drinking fairlife milk and shakes Suggested trying CIB mixed with fairlife milk as alternate ONS  Patient has contact information     MONITORING, EVALUATION, GOAL: weight trends, intake   NEXT VISIT: To be scheduled as needed

## 2021-02-02 ENCOUNTER — Inpatient Hospital Stay: Payer: Medicare HMO

## 2021-02-02 ENCOUNTER — Ambulatory Visit: Payer: Medicare HMO | Admitting: Podiatry

## 2021-02-02 VITALS — BP 126/85 | HR 88 | Temp 98.2°F | Resp 19

## 2021-02-02 DIAGNOSIS — M1990 Unspecified osteoarthritis, unspecified site: Secondary | ICD-10-CM | POA: Diagnosis not present

## 2021-02-02 DIAGNOSIS — Z5111 Encounter for antineoplastic chemotherapy: Secondary | ICD-10-CM | POA: Diagnosis not present

## 2021-02-02 DIAGNOSIS — G893 Neoplasm related pain (acute) (chronic): Secondary | ICD-10-CM | POA: Diagnosis not present

## 2021-02-02 DIAGNOSIS — L97519 Non-pressure chronic ulcer of other part of right foot with unspecified severity: Secondary | ICD-10-CM | POA: Diagnosis not present

## 2021-02-02 DIAGNOSIS — G629 Polyneuropathy, unspecified: Secondary | ICD-10-CM | POA: Diagnosis not present

## 2021-02-02 DIAGNOSIS — C251 Malignant neoplasm of body of pancreas: Secondary | ICD-10-CM | POA: Diagnosis not present

## 2021-02-02 DIAGNOSIS — E119 Type 2 diabetes mellitus without complications: Secondary | ICD-10-CM | POA: Diagnosis not present

## 2021-02-02 DIAGNOSIS — Z452 Encounter for adjustment and management of vascular access device: Secondary | ICD-10-CM | POA: Diagnosis not present

## 2021-02-02 DIAGNOSIS — C259 Malignant neoplasm of pancreas, unspecified: Secondary | ICD-10-CM

## 2021-02-02 DIAGNOSIS — I10 Essential (primary) hypertension: Secondary | ICD-10-CM | POA: Diagnosis not present

## 2021-02-02 LAB — CANCER ANTIGEN 19-9: CA 19-9: 1568 U/mL — ABNORMAL HIGH (ref 0–35)

## 2021-02-02 MED ORDER — SODIUM CHLORIDE 0.9% FLUSH
10.0000 mL | INTRAVENOUS | Status: DC | PRN
  Administered 2021-02-02: 10 mL

## 2021-02-02 MED ORDER — SODIUM CHLORIDE 0.9 % IV SOLN
800.0000 mg/m2 | Freq: Once | INTRAVENOUS | Status: AC
  Administered 2021-02-02: 1444 mg via INTRAVENOUS
  Filled 2021-02-02: qty 26.3

## 2021-02-02 MED ORDER — SODIUM CHLORIDE 0.9 % IV SOLN: INTRAVENOUS | Status: AC

## 2021-02-02 MED ORDER — PROCHLORPERAZINE MALEATE 10 MG PO TABS
10.0000 mg | ORAL_TABLET | Freq: Once | ORAL | Status: AC
  Administered 2021-02-02: 10 mg via ORAL
  Filled 2021-02-02: qty 1

## 2021-02-02 MED ORDER — HEPARIN SOD (PORK) LOCK FLUSH 100 UNIT/ML IV SOLN
500.0000 [IU] | Freq: Once | INTRAVENOUS | Status: AC | PRN
  Administered 2021-02-02: 500 [IU]

## 2021-02-02 MED ORDER — PACLITAXEL PROTEIN-BOUND CHEMO INJECTION 100 MG
100.0000 mg/m2 | Freq: Once | INTRAVENOUS | Status: AC
  Administered 2021-02-02: 175 mg via INTRAVENOUS
  Filled 2021-02-02: qty 35

## 2021-02-02 MED ORDER — SODIUM CHLORIDE 0.9 % IV SOLN: Freq: Once | INTRAVENOUS | Status: AC

## 2021-02-02 NOTE — Patient Instructions (Addendum)
[facility]   Discharge Instructions: Thank you for choosing [facility] to provide your oncology and hematology care.   If you have a lab appointment with the [facility], please go directly to the [facility] and check in at the registration area.   Wear comfortable clothing and clothing appropriate for easy access to any Portacath or PICC line.   We strive to give you quality time with your provider. You may need to reschedule your appointment if you arrive late (15 or more minutes).  Arriving late affects you and other patients whose appointments are after yours.  Also, if you miss three or more appointments without notifying the office, you may be dismissed from the clinic at the provider's discretion.      For prescription refill requests, have your pharmacy contact our office and allow 72 hours for refills to be completed.    Today you received the following chemotherapy and/or immunotherapy agents Abraxane, Gemzar      To help prevent nausea and vomiting after your treatment, we encourage you to take your nausea medication as directed.  BELOW ARE SYMPTOMS THAT SHOULD BE REPORTED IMMEDIATELY: *FEVER GREATER THAN 100.4 F (38 C) OR HIGHER *CHILLS OR SWEATING *NAUSEA AND VOMITING THAT IS NOT CONTROLLED WITH YOUR NAUSEA MEDICATION *UNUSUAL SHORTNESS OF BREATH *UNUSUAL BRUISING OR BLEEDING *URINARY PROBLEMS (pain or burning when urinating, or frequent urination) *BOWEL PROBLEMS (unusual diarrhea, constipation, pain near the anus) TENDERNESS IN MOUTH AND THROAT WITH OR WITHOUT PRESENCE OF ULCERS (sore throat, sores in mouth, or a toothache) UNUSUAL RASH, SWELLING OR PAIN  UNUSUAL VAGINAL DISCHARGE OR ITCHING   Items with * indicate a potential emergency and should be followed up as soon as possible or go to the Emergency Department if any problems should occur.  Please show the CHEMOTHERAPY ALERT CARD or IMMUNOTHERAPY ALERT CARD at  check-in to the Emergency Department and triage nurse.  Should you have questions after your visit or need to cancel or reschedule your appointment, please contact [facility]  Dept: [phone]  and follow the prompts.  Office hours are 8:00 a.m. to 4:30 p.m. Monday - Friday. Please note that voicemails left after 4:00 p.m. may not be returned until the following business day.  We are closed weekends and major holidays. You have access to a nurse at all times for urgent questions. Please call the main number to the clinic Dept: [phone] and follow the prompts.   For any non-urgent questions, you may also contact your provider using MyChart. We now offer e-Visits for anyone 39 and older to request care online for non-urgent symptoms. For details visit [email or web address].   Also download the MyChart app! Go to the app store, search "MyChart", open the app, select [facility], and log in with your MyChart username and password.  Due to Covid, a mask is required upon entering the hospital/clinic. If you do not have a mask, one will be given to you upon arrival. For doctor visits, patients may have 1 support person aged 52 or older with them. For treatment visits, patients cannot have anyone with them due to current Covid guidelines and our immunocompromised population.   Nanoparticle Albumin-Bound Paclitaxel injection What is this medication? NANOPARTICLE ALBUMIN-BOUND PACLITAXEL (Na no PAHR ti kuhl  al BYOO muhn-bound  PAK li TAX el) is a chemotherapy drug. It targets fast dividing cells, like cancer cells, and causes these cells to die. This medicine is used  to treat advanced breast cancer, lung cancer, and pancreatic cancer. This medicine may be used for other purposes; ask your health care provider or pharmacist if you have questions. COMMON BRAND NAME(S): Abraxane What should I tell my care team before I take this medication? They need to know if you have any  of these conditions: kidney disease liver disease low blood counts, like low white cell, platelet, or red cell counts lung or breathing disease, like asthma tingling of the fingers or toes, or other nerve disorder an unusual or allergic reaction to paclitaxel, albumin, other chemotherapy, other medicines, foods, dyes, or preservatives pregnant or trying to get pregnant breast-feeding How should I use this medication? This drug is given as an infusion into a vein. It is administered in a hospital or clinic by a specially trained health care professional. Talk to your pediatrician regarding the use of this medicine in children. Special care may be needed. Overdosage: If you think you have taken too much of this medicine contact a poison control center or emergency room at once. NOTE: This medicine is only for you. Do not share this medicine with others. What if I miss a dose? It is important not to miss your dose. Call your doctor or health care professional if you are unable to keep an appointment. What may interact with this medication? This medicine may interact with the following medications: antiviral medicines for hepatitis, HIV or AIDS certain antibiotics like erythromycin and clarithromycin certain medicines for fungal infections like ketoconazole and itraconazole certain medicines for seizures like carbamazepine, phenobarbital, phenytoin gemfibrozil nefazodone rifampin St. John's wort This list may not describe all possible interactions. Give your health care provider a list of all the medicines, herbs, non-prescription drugs, or dietary supplements you use. Also tell them if you smoke, drink alcohol, or use illegal drugs. Some items may interact with your medicine. What should I watch for while using this medication? Your condition will be monitored carefully while you are receiving this medicine. You will need important blood work done while you are taking this medicine. This  medicine can cause serious allergic reactions. If you experience allergic reactions like skin rash, itching or hives, swelling of the face, lips, or tongue, tell your doctor or health care professional right away. In some cases, you may be given additional medicines to help with side effects. Follow all directions for their use. This drug may make you feel generally unwell. This is not uncommon, as chemotherapy can affect healthy cells as well as cancer cells. Report any side effects. Continue your course of treatment even though you feel ill unless your doctor tells you to stop. Call your doctor or health care professional for advice if you get a fever, chills or sore throat, or other symptoms of a cold or flu. Do not treat yourself. This drug decreases your body's ability to fight infections. Try to avoid being around people who are sick. This medicine may increase your risk to bruise or bleed. Call your doctor or health care professional if you notice any unusual bleeding. Be careful brushing and flossing your teeth or using a toothpick because you may get an infection or bleed more easily. If you have any dental work done, tell your dentist you are receiving this medicine. Avoid taking products that contain aspirin, acetaminophen, ibuprofen, naproxen, or ketoprofen unless instructed by your doctor. These medicines may hide a fever. Do not become pregnant while taking this medicine or for 6 months after stopping it. Women should  inform their doctor if they wish to become pregnant or think they might be pregnant. Men should not father a child while taking this medicine or for 3 months after stopping it. There is a potential for serious side effects to an unborn child. Talk to your health care professional or pharmacist for more information. Do not breast-feed an infant while taking this medicine or for 2 weeks after stopping it. This medicine may interfere with the ability to get pregnant or to father a  child. You should talk to your doctor or health care professional if you are concerned about your fertility. What side effects may I notice from receiving this medication? Side effects that you should report to your doctor or health care professional as soon as possible: allergic reactions like skin rash, itching or hives, swelling of the face, lips, or tongue breathing problems changes in vision fast, irregular heartbeat low blood pressure mouth sores pain, tingling, numbness in the hands or feet signs of decreased platelets or bleeding - bruising, pinpoint red spots on the skin, black, tarry stools, blood in the urine signs of decreased red blood cells - unusually weak or tired, feeling faint or lightheaded, falls signs of infection - fever or chills, cough, sore throat, pain or difficulty passing urine signs and symptoms of liver injury like dark yellow or brown urine; general ill feeling or flu-like symptoms; light-colored stools; loss of appetite; nausea; right upper belly pain; unusually weak or tired; yellowing of the eyes or skin swelling of the ankles, feet, hands unusually slow heartbeat Side effects that usually do not require medical attention (report to your doctor or health care professional if they continue or are bothersome): diarrhea hair loss loss of appetite nausea, vomiting tiredness This list may not describe all possible side effects. Call your doctor for medical advice about side effects. You may report side effects to FDA at 1-800-FDA-1088. Where should I keep my medication? This drug is given in a hospital or clinic and will not be stored at home. NOTE: This sheet is a summary. It may not cover all possible information. If you have questions about this medicine, talk to your doctor, pharmacist, or health care provider.  2022 Elsevier/Gold Standard (2016-11-05 00:00:00)  Gemcitabine injection What is this medication? GEMCITABINE (jem SYE ta been) is a  chemotherapy drug. This medicine is used to treat many types of cancer like breast cancer, lung cancer, pancreatic cancer, and ovarian cancer. This medicine may be used for other purposes; ask your health care provider or pharmacist if you have questions. COMMON BRAND NAME(S): Gemzar, Infugem What should I tell my care team before I take this medication? They need to know if you have any of these conditions: blood disorders infection kidney disease liver disease lung or breathing disease, like asthma recent or ongoing radiation therapy an unusual or allergic reaction to gemcitabine, other chemotherapy, other medicines, foods, dyes, or preservatives pregnant or trying to get pregnant breast-feeding How should I use this medication? This drug is given as an infusion into a vein. It is administered in a hospital or clinic by a specially trained health care professional. Talk to your pediatrician regarding the use of this medicine in children. Special care may be needed. Overdosage: If you think you have taken too much of this medicine contact a poison control center or emergency room at once. NOTE: This medicine is only for you. Do not share this medicine with others. What if I miss a dose? It is important not  to miss your dose. Call your doctor or health care professional if you are unable to keep an appointment. What may interact with this medication? medicines to increase blood counts like filgrastim, pegfilgrastim, sargramostim some other chemotherapy drugs like cisplatin vaccines Talk to your doctor or health care professional before taking any of these medicines: acetaminophen aspirin ibuprofen ketoprofen naproxen This list may not describe all possible interactions. Give your health care provider a list of all the medicines, herbs, non-prescription drugs, or dietary supplements you use. Also tell them if you smoke, drink alcohol, or use illegal drugs. Some items may interact with  your medicine. What should I watch for while using this medication? Visit your doctor for checks on your progress. This drug may make you feel generally unwell. This is not uncommon, as chemotherapy can affect healthy cells as well as cancer cells. Report any side effects. Continue your course of treatment even though you feel ill unless your doctor tells you to stop. In some cases, you may be given additional medicines to help with side effects. Follow all directions for their use. Call your doctor or health care professional for advice if you get a fever, chills or sore throat, or other symptoms of a cold or flu. Do not treat yourself. This drug decreases your body's ability to fight infections. Try to avoid being around people who are sick. This medicine may increase your risk to bruise or bleed. Call your doctor or health care professional if you notice any unusual bleeding. Be careful brushing and flossing your teeth or using a toothpick because you may get an infection or bleed more easily. If you have any dental work done, tell your dentist you are receiving this medicine. Avoid taking products that contain aspirin, acetaminophen, ibuprofen, naproxen, or ketoprofen unless instructed by your doctor. These medicines may hide a fever. Do not become pregnant while taking this medicine or for 6 months after stopping it. Women should inform their doctor if they wish to become pregnant or think they might be pregnant. Men should not father a child while taking this medicine and for 3 months after stopping it. There is a potential for serious side effects to an unborn child. Talk to your health care professional or pharmacist for more information. Do not breast-feed an infant while taking this medicine or for at least 1 week after stopping it. Men should inform their doctors if they wish to father a child. This medicine may lower sperm counts. Talk with your doctor or health care professional if you are  concerned about your fertility. What side effects may I notice from receiving this medication? Side effects that you should report to your doctor or health care professional as soon as possible: allergic reactions like skin rash, itching or hives, swelling of the face, lips, or tongue breathing problems pain, redness, or irritation at site where injected signs and symptoms of a dangerous change in heartbeat or heart rhythm like chest pain; dizziness; fast or irregular heartbeat; palpitations; feeling faint or lightheaded, falls; breathing problems signs of decreased platelets or bleeding - bruising, pinpoint red spots on the skin, black, tarry stools, blood in the urine signs of decreased red blood cells - unusually weak or tired, feeling faint or lightheaded, falls signs of infection - fever or chills, cough, sore throat, pain or difficulty passing urine signs and symptoms of kidney injury like trouble passing urine or change in the amount of urine signs and symptoms of liver injury like dark yellow or  brown urine; general ill feeling or flu-like symptoms; light-colored stools; loss of appetite; nausea; right upper belly pain; unusually weak or tired; yellowing of the eyes or skin swelling of ankles, feet, hands Side effects that usually do not require medical attention (report to your doctor or health care professional if they continue or are bothersome): constipation diarrhea hair loss loss of appetite nausea rash vomiting This list may not describe all possible side effects. Call your doctor for medical advice about side effects. You may report side effects to FDA at 1-800-FDA-1088. Where should I keep my medication? This drug is given in a hospital or clinic and will not be stored at home. NOTE: This sheet is a summary. It may not cover all possible information. If you have questions about this medicine, talk to your doctor, pharmacist, or health care provider.  2022 Elsevier/Gold  Standard (2017-05-28 00:00:00)

## 2021-02-02 NOTE — Progress Notes (Signed)
Patient presents for treatment. RN assessment completed along with the following:  Labs/vitals reviewed - Yes, and within treatment parameters.   Weight within 10% of previous measurement - Yes Oncology Treatment Attestation completed for current therapy- Yes, on date 09/12/20 Informed consent completed and reflects current therapy/intent - Yes, on date 10/12/20             Provider progress note reviewed -  1 04/03/20  Treatment/Antibody/Supportive plan reviewed - Yes, and there are no adjustments needed for today's treatment. S&H and other orders reviewed - Yes, and there are no additional orders identified. Previous treatment date reviewed - Yes, and the appropriate amount of time has elapsed between treatments. Clinic Hand Off Received from - none   Patient to proceed with treatment.

## 2021-02-11 ENCOUNTER — Other Ambulatory Visit: Payer: Self-pay | Admitting: Oncology

## 2021-02-13 ENCOUNTER — Telehealth: Payer: Self-pay

## 2021-02-13 NOTE — Telephone Encounter (Signed)
Pt called wanting to know when her next B12 is due. Please Advise.

## 2021-02-13 NOTE — Telephone Encounter (Signed)
LVM for patient to return call. 

## 2021-02-13 NOTE — Telephone Encounter (Signed)
Patient had last injection beginning of November, had levels checked 01/11/2021. Please advise

## 2021-02-14 NOTE — Telephone Encounter (Signed)
Patient is scheduled for the next 3 months

## 2021-02-15 ENCOUNTER — Other Ambulatory Visit: Payer: Self-pay

## 2021-02-15 ENCOUNTER — Inpatient Hospital Stay: Payer: Medicare HMO | Attending: Hematology | Admitting: Nurse Practitioner

## 2021-02-15 ENCOUNTER — Inpatient Hospital Stay: Payer: Medicare HMO

## 2021-02-15 ENCOUNTER — Encounter: Payer: Self-pay | Admitting: Nurse Practitioner

## 2021-02-15 VITALS — BP 130/80 | HR 88 | Temp 97.7°F | Resp 18 | Ht 65.0 in | Wt 153.4 lb

## 2021-02-15 DIAGNOSIS — C251 Malignant neoplasm of body of pancreas: Secondary | ICD-10-CM | POA: Insufficient documentation

## 2021-02-15 DIAGNOSIS — Z5111 Encounter for antineoplastic chemotherapy: Secondary | ICD-10-CM | POA: Insufficient documentation

## 2021-02-15 DIAGNOSIS — G893 Neoplasm related pain (acute) (chronic): Secondary | ICD-10-CM | POA: Diagnosis not present

## 2021-02-15 DIAGNOSIS — C259 Malignant neoplasm of pancreas, unspecified: Secondary | ICD-10-CM

## 2021-02-15 DIAGNOSIS — C787 Secondary malignant neoplasm of liver and intrahepatic bile duct: Secondary | ICD-10-CM | POA: Insufficient documentation

## 2021-02-15 DIAGNOSIS — Z79899 Other long term (current) drug therapy: Secondary | ICD-10-CM | POA: Diagnosis not present

## 2021-02-15 DIAGNOSIS — I10 Essential (primary) hypertension: Secondary | ICD-10-CM | POA: Insufficient documentation

## 2021-02-15 DIAGNOSIS — Z95828 Presence of other vascular implants and grafts: Secondary | ICD-10-CM

## 2021-02-15 DIAGNOSIS — E119 Type 2 diabetes mellitus without complications: Secondary | ICD-10-CM | POA: Insufficient documentation

## 2021-02-15 LAB — CBC WITH DIFFERENTIAL (CANCER CENTER ONLY)
Abs Immature Granulocytes: 0.02 10*3/uL (ref 0.00–0.07)
Basophils Absolute: 0 10*3/uL (ref 0.0–0.1)
Basophils Relative: 1 %
Eosinophils Absolute: 0.1 10*3/uL (ref 0.0–0.5)
Eosinophils Relative: 1 %
HCT: 39.4 % (ref 36.0–46.0)
Hemoglobin: 13.2 g/dL (ref 12.0–15.0)
Immature Granulocytes: 0 %
Lymphocytes Relative: 35 %
Lymphs Abs: 2.7 10*3/uL (ref 0.7–4.0)
MCH: 32.4 pg (ref 26.0–34.0)
MCHC: 33.5 g/dL (ref 30.0–36.0)
MCV: 96.6 fL (ref 80.0–100.0)
Monocytes Absolute: 0.6 10*3/uL (ref 0.1–1.0)
Monocytes Relative: 8 %
Neutro Abs: 4.2 10*3/uL (ref 1.7–7.7)
Neutrophils Relative %: 55 %
Platelet Count: 148 10*3/uL — ABNORMAL LOW (ref 150–400)
RBC: 4.08 MIL/uL (ref 3.87–5.11)
RDW: 15.3 % (ref 11.5–15.5)
WBC Count: 7.7 10*3/uL (ref 4.0–10.5)
nRBC: 0 % (ref 0.0–0.2)

## 2021-02-15 LAB — CMP (CANCER CENTER ONLY)
ALT: 19 U/L (ref 0–44)
AST: 17 U/L (ref 15–41)
Albumin: 4.4 g/dL (ref 3.5–5.0)
Alkaline Phosphatase: 74 U/L (ref 38–126)
Anion gap: 8 (ref 5–15)
BUN: 15 mg/dL (ref 8–23)
CO2: 24 mmol/L (ref 22–32)
Calcium: 9.9 mg/dL (ref 8.9–10.3)
Chloride: 105 mmol/L (ref 98–111)
Creatinine: 0.68 mg/dL (ref 0.44–1.00)
GFR, Estimated: >60 mL/min (ref >60)
Glucose, Bld: 143 mg/dL — ABNORMAL HIGH (ref 70–99)
Potassium: 3.9 mmol/L (ref 3.5–5.1)
Sodium: 137 mmol/L (ref 135–145)
Total Bilirubin: 0.5 mg/dL (ref 0.3–1.2)
Total Protein: 6.6 g/dL (ref 6.5–8.1)

## 2021-02-15 MED ORDER — HEPARIN SOD (PORK) LOCK FLUSH 100 UNIT/ML IV SOLN
500.0000 [IU] | Freq: Once | INTRAVENOUS | Status: AC
  Administered 2021-02-15: 500 [IU] via INTRAVENOUS

## 2021-02-15 MED ORDER — SODIUM CHLORIDE 0.9% FLUSH
10.0000 mL | Freq: Once | INTRAVENOUS | Status: AC
  Administered 2021-02-15: 10 mL via INTRAVENOUS

## 2021-02-15 NOTE — Progress Notes (Signed)
  [facility] OFFICE PROGRESS NOTE   Diagnosis: Pancreas cancer  INTERVAL HISTORY:   Ms. Lippman returns as scheduled.  She completed another cycle of gemcitabine/Abraxane 02/01/2021.  She had mild nausea.  She has noted alteration in taste and decreased appetite for several days after each treatment.  No mouth sores.  No diarrhea.  She had numbness in her hands and feet prior to beginning chemotherapy.  The numbness has increased.  Objective:  Vital signs in last 24 hours:  Blood pressure 130/80, pulse 88, temperature 97.7 F (36.5 C), temperature source Oral, resp. rate 18, height 5\' 5"  (1.651 m), weight 153 lb 6.4 oz (69.6 kg), SpO2 100 %.    HEENT: No thrush or ulcers. Resp: Lungs clear bilaterally. Cardio: Regular rate and rhythm. GI: Abdomen soft and nontender.  No hepatomegaly. Vascular: No leg edema. Neuro: Vibratory sense mildly to moderately decreased over the fingertips per tuning fork exam. Skin: Subcutaneous cystic like lesion lower left breast.  No erythema. Port-A-Cath without erythema.   Lab Results:  Lab Results  Component Value Date   WBC 7.7 02/15/2021   HGB 13.2 02/15/2021   HCT 39.4 02/15/2021   MCV 96.6 02/15/2021   PLT 148 (L) 02/15/2021   NEUTROABS 4.2 02/15/2021    Imaging:  No results found.  Medications: I have reviewed the patient's current medications.  Assessment/Plan: Adenocarcinoma the pancreas body, stage IV (cT4,cN0,pM1) Ultrasound abdomen 08/18/2020-possible hypoechoic pancreas body mass, small hypoechoic liver areas MRI abdomen 08/27/2020-pancreas body mass, multiple hepatic lesions consistent with metastases, right abdominal omental nodularity, pancreas mass extends to the celiac bifurcation and abuts the splenic vein and splenoportal confluence CT chest 09/07/2020-scattered pulmonary nodules concerning for metastases, pancreas body mass, hepatic metastases Ultrasound-guided biopsy of a right liver lesion  09/08/2020-adenocarcinoma consistent with a pancreas primary CT abdomen/pelvis 09/24/2020-pancreas neck mass, omental and peritoneal nodularity-unchanged, multiple hypoenhancing liver masses-unchanged, numerous small pulmonary nodules Cycle 1 gemcitabine/Abraxane 10/04/2020 Cycle 2 gemcitabine/Abraxane 10/27/2020 Cycle 3 gemcitabine/Abraxane 11/11/2018 Cycle 4 gemcitabine/Abraxane 11/23/2020 Cycle 5 gemcitabine/Abraxane 12/08/2020 CT abdomen/pelvis 12/18/2020-stable pancreas mass, stable and improved liver lesions, resolution of omental soft tissue density, stable indeterminate lung nodules, no disease progression Cycle 6 gemcitabine/Abraxane 12/22/2020 Cycle 7 gemcitabine 01/05/2021, 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  Pain secondary #1 3.  Diabetes 4.  Hypertension 5.  Osteoarthritis 6.  Port-A-Cath placement 09/22/2020 7.  Admission with jaundice 09/24/2020.  MRI-new abrupt constriction of the common hepatic duct.  The infiltrative pancreatic mass extends medially in the porta hepatis to obstruct the common hepatic duct.  Multiple right hepatic lobe metastases mildly increased in size.  Stent placed into the common bile duct 09/28/2020.    Disposition: Ms. Humphreys appears stable.  She has completed 9 cycles of gemcitabine/Abraxane.  Plan to proceed with cycle 10 tomorrow as scheduled, gemcitabine alone.  Abraxane will be held due to increased neuropathy.  She agrees with this plan.  CBC and chemistry panel reviewed.  Labs adequate to proceed as above.  She will return for lab, follow-up, Gemcitabine/Abraxane in approximately 2 weeks.  She will contact the office in the interim with any problems.    Ned Card ANP/GNP-BC   02/15/2021  11:32 AM

## 2021-02-15 NOTE — Patient Instructions (Signed)

## 2021-02-16 ENCOUNTER — Inpatient Hospital Stay: Payer: Medicare HMO

## 2021-02-16 VITALS — BP 126/73 | HR 73 | Temp 98.0°F | Resp 18

## 2021-02-16 DIAGNOSIS — G893 Neoplasm related pain (acute) (chronic): Secondary | ICD-10-CM | POA: Diagnosis not present

## 2021-02-16 DIAGNOSIS — I10 Essential (primary) hypertension: Secondary | ICD-10-CM | POA: Diagnosis not present

## 2021-02-16 DIAGNOSIS — C251 Malignant neoplasm of body of pancreas: Secondary | ICD-10-CM | POA: Diagnosis not present

## 2021-02-16 DIAGNOSIS — C259 Malignant neoplasm of pancreas, unspecified: Secondary | ICD-10-CM

## 2021-02-16 DIAGNOSIS — Z79899 Other long term (current) drug therapy: Secondary | ICD-10-CM | POA: Diagnosis not present

## 2021-02-16 DIAGNOSIS — E119 Type 2 diabetes mellitus without complications: Secondary | ICD-10-CM | POA: Diagnosis not present

## 2021-02-16 DIAGNOSIS — Z5111 Encounter for antineoplastic chemotherapy: Secondary | ICD-10-CM | POA: Diagnosis not present

## 2021-02-16 DIAGNOSIS — C787 Secondary malignant neoplasm of liver and intrahepatic bile duct: Secondary | ICD-10-CM | POA: Diagnosis not present

## 2021-02-16 LAB — CANCER ANTIGEN 19-9: CA 19-9: 1210 U/mL — ABNORMAL HIGH (ref 0–35)

## 2021-02-16 MED ORDER — HEPARIN SOD (PORK) LOCK FLUSH 100 UNIT/ML IV SOLN
500.0000 [IU] | Freq: Once | INTRAVENOUS | Status: AC | PRN
  Administered 2021-02-16: 500 [IU]

## 2021-02-16 MED ORDER — PROCHLORPERAZINE MALEATE 10 MG PO TABS
10.0000 mg | ORAL_TABLET | Freq: Once | ORAL | Status: AC
  Administered 2021-02-16: 10 mg via ORAL
  Filled 2021-02-16: qty 1

## 2021-02-16 MED ORDER — SODIUM CHLORIDE 0.9 % IV SOLN
800.0000 mg/m2 | Freq: Once | INTRAVENOUS | Status: AC
  Administered 2021-02-16: 1444 mg via INTRAVENOUS
  Filled 2021-02-16: qty 26.3

## 2021-02-16 MED ORDER — SODIUM CHLORIDE 0.9 % IV SOLN: Freq: Once | INTRAVENOUS | Status: AC

## 2021-02-16 MED ORDER — SODIUM CHLORIDE 0.9% FLUSH
10.0000 mL | INTRAVENOUS | Status: DC | PRN
  Administered 2021-02-16: 10 mL

## 2021-02-16 MED ORDER — SODIUM CHLORIDE 0.9 % IV SOLN: INTRAVENOUS | Status: AC

## 2021-02-16 NOTE — Patient Instructions (Addendum)
[facility]  Discharge Instructions: Thank you for choosing [facility] to provide your oncology and hematology care.   If you have a lab appointment with the [facility], please go directly to the [facility] and check in at the registration area.   Wear comfortable clothing and clothing appropriate for easy access to any Portacath or PICC line.   We strive to give you quality time with your provider. You may need to reschedule your appointment if you arrive late (15 or more minutes).  Arriving late affects you and other patients whose appointments are after yours.  Also, if you miss three or more appointments without notifying the office, you may be dismissed from the clinic at the provider's discretion.      For prescription refill requests, have your pharmacy contact our office and allow 72 hours for refills to be completed.    Today you received the following chemotherapy and/or immunotherapy agents gemcitabine      To help prevent nausea and vomiting after your treatment, we encourage you to take your nausea medication as directed.  BELOW ARE SYMPTOMS THAT SHOULD BE REPORTED IMMEDIATELY: *FEVER GREATER THAN 100.4 F (38 C) OR HIGHER *CHILLS OR SWEATING *NAUSEA AND VOMITING THAT IS NOT CONTROLLED WITH YOUR NAUSEA MEDICATION *UNUSUAL SHORTNESS OF BREATH *UNUSUAL BRUISING OR BLEEDING *URINARY PROBLEMS (pain or burning when urinating, or frequent urination) *BOWEL PROBLEMS (unusual diarrhea, constipation, pain near the anus) TENDERNESS IN MOUTH AND THROAT WITH OR WITHOUT PRESENCE OF ULCERS (sore throat, sores in mouth, or a toothache) UNUSUAL RASH, SWELLING OR PAIN  UNUSUAL VAGINAL DISCHARGE OR ITCHING   Items with * indicate a potential emergency and should be followed up as soon as possible or go to the Emergency Department if any problems should occur.  Please show the CHEMOTHERAPY ALERT CARD or IMMUNOTHERAPY ALERT CARD at check-in to  the Emergency Department and triage nurse.  Should you have questions after your visit or need to cancel or reschedule your appointment, please contact [facility]  Dept: [phone]  and follow the prompts.  Office hours are 8:00 a.m. to 4:30 p.m. Monday - Friday. Please note that voicemails left after 4:00 p.m. may not be returned until the following business day.  We are closed weekends and major holidays. You have access to a nurse at all times for urgent questions. Please call the main number to the clinic Dept: [phone] and follow the prompts.   For any non-urgent questions, you may also contact your provider using MyChart. We now offer e-Visits for anyone 43 and older to request care online for non-urgent symptoms. For details visit [email or web address].   Also download the MyChart app! Go to the app store, search "MyChart", open the app, select [facility], and log in with your MyChart username and password.  Due to Covid, a mask is required upon entering the hospital/clinic. If you do not have a mask, one will be given to you upon arrival. For doctor visits, patients may have 1 support person aged 20 or older with them. For treatment visits, patients cannot have anyone with them due to current Covid guidelines and our immunocompromised population.   Gemcitabine injection What is this medication? GEMCITABINE (jem SYE ta been) is a chemotherapy drug. This medicine is used to treat many types of cancer like breast cancer, lung cancer, pancreatic cancer, and ovarian cancer. This medicine may be used for other purposes; ask your health care provider or  pharmacist if you have questions. COMMON BRAND NAME(S): Gemzar, Infugem What should I tell my care team before I take this medication? They need to know if you have any of these conditions: blood disorders infection kidney disease liver disease lung or breathing disease, like asthma recent or ongoing  radiation therapy an unusual or allergic reaction to gemcitabine, other chemotherapy, other medicines, foods, dyes, or preservatives pregnant or trying to get pregnant breast-feeding How should I use this medication? This drug is given as an infusion into a vein. It is administered in a hospital or clinic by a specially trained health care professional. Talk to your pediatrician regarding the use of this medicine in children. Special care may be needed. Overdosage: If you think you have taken too much of this medicine contact a poison control center or emergency room at once. NOTE: This medicine is only for you. Do not share this medicine with others. What if I miss a dose? It is important not to miss your dose. Call your doctor or health care professional if you are unable to keep an appointment. What may interact with this medication? medicines to increase blood counts like filgrastim, pegfilgrastim, sargramostim some other chemotherapy drugs like cisplatin vaccines Talk to your doctor or health care professional before taking any of these medicines: acetaminophen aspirin ibuprofen ketoprofen naproxen This list may not describe all possible interactions. Give your health care provider a list of all the medicines, herbs, non-prescription drugs, or dietary supplements you use. Also tell them if you smoke, drink alcohol, or use illegal drugs. Some items may interact with your medicine. What should I watch for while using this medication? Visit your doctor for checks on your progress. This drug may make you feel generally unwell. This is not uncommon, as chemotherapy can affect healthy cells as well as cancer cells. Report any side effects. Continue your course of treatment even though you feel ill unless your doctor tells you to stop. In some cases, you may be given additional medicines to help with side effects. Follow all directions for their use. Call your doctor or health care  professional for advice if you get a fever, chills or sore throat, or other symptoms of a cold or flu. Do not treat yourself. This drug decreases your body's ability to fight infections. Try to avoid being around people who are sick. This medicine may increase your risk to bruise or bleed. Call your doctor or health care professional if you notice any unusual bleeding. Be careful brushing and flossing your teeth or using a toothpick because you may get an infection or bleed more easily. If you have any dental work done, tell your dentist you are receiving this medicine. Avoid taking products that contain aspirin, acetaminophen, ibuprofen, naproxen, or ketoprofen unless instructed by your doctor. These medicines may hide a fever. Do not become pregnant while taking this medicine or for 6 months after stopping it. Women should inform their doctor if they wish to become pregnant or think they might be pregnant. Men should not father a child while taking this medicine and for 3 months after stopping it. There is a potential for serious side effects to an unborn child. Talk to your health care professional or pharmacist for more information. Do not breast-feed an infant while taking this medicine or for at least 1 week after stopping it. Men should inform their doctors if they wish to father a child. This medicine may lower sperm counts. Talk with your doctor or health care  professional if you are concerned about your fertility. What side effects may I notice from receiving this medication? Side effects that you should report to your doctor or health care professional as soon as possible: allergic reactions like skin rash, itching or hives, swelling of the face, lips, or tongue breathing problems pain, redness, or irritation at site where injected signs and symptoms of a dangerous change in heartbeat or heart rhythm like chest pain; dizziness; fast or irregular heartbeat; palpitations; feeling faint or  lightheaded, falls; breathing problems signs of decreased platelets or bleeding - bruising, pinpoint red spots on the skin, black, tarry stools, blood in the urine signs of decreased red blood cells - unusually weak or tired, feeling faint or lightheaded, falls signs of infection - fever or chills, cough, sore throat, pain or difficulty passing urine signs and symptoms of kidney injury like trouble passing urine or change in the amount of urine signs and symptoms of liver injury like dark yellow or brown urine; general ill feeling or flu-like symptoms; light-colored stools; loss of appetite; nausea; right upper belly pain; unusually weak or tired; yellowing of the eyes or skin swelling of ankles, feet, hands Side effects that usually do not require medical attention (report to your doctor or health care professional if they continue or are bothersome): constipation diarrhea hair loss loss of appetite nausea rash vomiting This list may not describe all possible side effects. Call your doctor for medical advice about side effects. You may report side effects to FDA at 1-800-FDA-1088. Where should I keep my medication? This drug is given in a hospital or clinic and will not be stored at home. NOTE: This sheet is a summary. It may not cover all possible information. If you have questions about this medicine, talk to your doctor, pharmacist, or health care provider.  2022 Elsevier/Gold Standard (2017-05-28 00:00:00)

## 2021-02-16 NOTE — Progress Notes (Signed)
Patient presents for treatment. RN assessment completed along with the following:  Labs/vitals reviewed - Yes, and within treatment parameters.   Weight within 10% of previous measurement - Yes Oncology Treatment Attestation completed for current therapy- Yes, on date 09/12/20 Informed consent completed and reflects current therapy/intent - Yes, on date 10/12/20             Provider progress note reviewed - Patient not seen by provider today. Most recent note dated 02/15/21 reviewed. Treatment/Antibody/Supportive plan reviewed - Yes, and abraxane to be held this treatment.  S&H and other orders reviewed - Yes, and there are no additional orders identified. Previous treatment date reviewed - Yes, and the appropriate amount of time has elapsed between treatments.   Patient to proceed with treatment.

## 2021-02-23 ENCOUNTER — Encounter: Payer: Medicare HMO | Admitting: Family Medicine

## 2021-02-24 ENCOUNTER — Other Ambulatory Visit: Payer: Self-pay | Admitting: Oncology

## 2021-02-27 ENCOUNTER — Ambulatory Visit (INDEPENDENT_AMBULATORY_CARE_PROVIDER_SITE_OTHER): Payer: Medicare HMO

## 2021-02-27 ENCOUNTER — Other Ambulatory Visit: Payer: Self-pay

## 2021-02-27 DIAGNOSIS — E538 Deficiency of other specified B group vitamins: Secondary | ICD-10-CM

## 2021-02-27 MED ORDER — CYANOCOBALAMIN 1000 MCG/ML IJ SOLN
1000.0000 ug | Freq: Once | INTRAMUSCULAR | Status: AC
Start: 2021-02-27 — End: 2021-02-27
  Administered 2021-02-27: 1000 ug via INTRAMUSCULAR

## 2021-02-27 NOTE — Progress Notes (Signed)
[name] presents in office today for b12 injection per Rosendo Gros Andy,MD. Tolerated injection well, will return next month for next injection

## 2021-03-01 ENCOUNTER — Inpatient Hospital Stay: Payer: Medicare HMO

## 2021-03-01 ENCOUNTER — Inpatient Hospital Stay (HOSPITAL_BASED_OUTPATIENT_CLINIC_OR_DEPARTMENT_OTHER): Payer: Medicare HMO | Admitting: Oncology

## 2021-03-01 ENCOUNTER — Other Ambulatory Visit: Payer: Medicare HMO

## 2021-03-01 ENCOUNTER — Ambulatory Visit: Payer: Medicare HMO | Admitting: Nurse Practitioner

## 2021-03-01 ENCOUNTER — Ambulatory Visit: Payer: Medicare HMO | Admitting: Oncology

## 2021-03-01 ENCOUNTER — Other Ambulatory Visit: Payer: Self-pay

## 2021-03-01 VITALS — BP 130/77 | HR 79 | Temp 98.1°F | Resp 18 | Ht 65.0 in | Wt 154.6 lb

## 2021-03-01 DIAGNOSIS — I10 Essential (primary) hypertension: Secondary | ICD-10-CM | POA: Diagnosis not present

## 2021-03-01 DIAGNOSIS — C259 Malignant neoplasm of pancreas, unspecified: Secondary | ICD-10-CM

## 2021-03-01 DIAGNOSIS — G893 Neoplasm related pain (acute) (chronic): Secondary | ICD-10-CM | POA: Diagnosis not present

## 2021-03-01 DIAGNOSIS — Z79899 Other long term (current) drug therapy: Secondary | ICD-10-CM | POA: Diagnosis not present

## 2021-03-01 DIAGNOSIS — Z5111 Encounter for antineoplastic chemotherapy: Secondary | ICD-10-CM | POA: Diagnosis not present

## 2021-03-01 DIAGNOSIS — C787 Secondary malignant neoplasm of liver and intrahepatic bile duct: Secondary | ICD-10-CM | POA: Diagnosis not present

## 2021-03-01 DIAGNOSIS — C251 Malignant neoplasm of body of pancreas: Secondary | ICD-10-CM | POA: Diagnosis not present

## 2021-03-01 DIAGNOSIS — E119 Type 2 diabetes mellitus without complications: Secondary | ICD-10-CM | POA: Diagnosis not present

## 2021-03-01 LAB — CBC WITH DIFFERENTIAL (CANCER CENTER ONLY)
Abs Immature Granulocytes: 0.02 10*3/uL (ref 0.00–0.07)
Basophils Absolute: 0 10*3/uL (ref 0.0–0.1)
Basophils Relative: 0 %
Eosinophils Absolute: 0.1 10*3/uL (ref 0.0–0.5)
Eosinophils Relative: 2 %
HCT: 39.2 % (ref 36.0–46.0)
Hemoglobin: 13 g/dL (ref 12.0–15.0)
Immature Granulocytes: 0 %
Lymphocytes Relative: 33 %
Lymphs Abs: 2.6 10*3/uL (ref 0.7–4.0)
MCH: 31.9 pg (ref 26.0–34.0)
MCHC: 33.2 g/dL (ref 30.0–36.0)
MCV: 96.1 fL (ref 80.0–100.0)
Monocytes Absolute: 0.6 10*3/uL (ref 0.1–1.0)
Monocytes Relative: 8 %
Neutro Abs: 4.4 10*3/uL (ref 1.7–7.7)
Neutrophils Relative %: 57 %
Platelet Count: 127 10*3/uL — ABNORMAL LOW (ref 150–400)
RBC: 4.08 MIL/uL (ref 3.87–5.11)
RDW: 15 % (ref 11.5–15.5)
WBC Count: 7.8 10*3/uL (ref 4.0–10.5)
nRBC: 0 % (ref 0.0–0.2)

## 2021-03-01 LAB — CMP (CANCER CENTER ONLY)
ALT: 25 U/L (ref 0–44)
AST: 15 U/L (ref 15–41)
Albumin: 4.1 g/dL (ref 3.5–5.0)
Alkaline Phosphatase: 89 U/L (ref 38–126)
Anion gap: 9 (ref 5–15)
BUN: 17 mg/dL (ref 8–23)
CO2: 23 mmol/L (ref 22–32)
Calcium: 9.2 mg/dL (ref 8.9–10.3)
Chloride: 106 mmol/L (ref 98–111)
Creatinine: 0.72 mg/dL (ref 0.44–1.00)
GFR, Estimated: >60 mL/min (ref >60)
Glucose, Bld: 156 mg/dL — ABNORMAL HIGH (ref 70–99)
Potassium: 4 mmol/L (ref 3.5–5.1)
Sodium: 138 mmol/L (ref 135–145)
Total Bilirubin: 0.4 mg/dL (ref 0.3–1.2)
Total Protein: 6.4 g/dL — ABNORMAL LOW (ref 6.5–8.1)

## 2021-03-01 MED ORDER — HEPARIN SOD (PORK) LOCK FLUSH 100 UNIT/ML IV SOLN
500.0000 [IU] | Freq: Once | INTRAVENOUS | Status: AC
  Administered 2021-03-01: 500 [IU] via INTRAVENOUS

## 2021-03-01 MED ORDER — SODIUM CHLORIDE 0.9% FLUSH
10.0000 mL | Freq: Once | INTRAVENOUS | Status: AC
  Administered 2021-03-01: 10 mL via INTRAVENOUS

## 2021-03-01 NOTE — Progress Notes (Signed)
[facility] OFFICE PROGRESS NOTE   Diagnosis: Pancreas cancer  INTERVAL HISTORY:   [name] completed another cycle of gemcitabine on 02/16/2021.  No fever.  She does not have significant abdominal pain.  She has developed "acne "over the face.  Peripheral numbness is unchanged.  Objective:  Vital signs in last 24 hours:  Blood pressure 130/77, pulse 79, temperature 98.1 F (36.7 C), temperature source Oral, resp. rate 18, height 5\' 5"  (1.651 m), weight 154 lb 9.6 oz (70.1 kg), SpO2 100 %.    HEENT: No thrush or ulcers Resp: Lungs clear bilaterally Cardio: Regular rate and rhythm GI: No mass, no hepatosplenomegaly Vascular: No leg edema  Skin: Few erythematous slightly raised pimple type lesions over the face  Portacath/PICC-without erythema  Lab Results:  Lab Results  Component Value Date   WBC 7.8 03/01/2021   HGB 13.0 03/01/2021   HCT 39.2 03/01/2021   MCV 96.1 03/01/2021   PLT 127 (L) 03/01/2021   NEUTROABS 4.4 03/01/2021    CMP  Lab Results  Component Value Date   NA 137 02/15/2021   K 3.9 02/15/2021   CL 105 02/15/2021   CO2 24 02/15/2021   GLUCOSE 143 (H) 02/15/2021   BUN 15 02/15/2021   CREATININE 0.68 02/15/2021   CALCIUM 9.9 02/15/2021   PROT 6.6 02/15/2021   ALBUMIN 4.4 02/15/2021   AST 17 02/15/2021   ALT 19 02/15/2021   ALKPHOS 74 02/15/2021   BILITOT 0.5 02/15/2021   GFRNONAA >60 02/15/2021   GFRAA 71 02/02/2019    Lab Results  Component Value Date   WGY659 1,210 (H) 02/15/2021    Medications: I have reviewed the patient's current medications.   Assessment/Plan: Adenocarcinoma the pancreas body, stage IV (cT4,cN0,pM1) Ultrasound abdomen 08/18/2020-possible hypoechoic pancreas body mass, small hypoechoic liver areas MRI abdomen 08/27/2020-pancreas body mass, multiple hepatic lesions consistent with metastases, right abdominal omental nodularity, pancreas mass extends to the celiac bifurcation and abuts the splenic vein  and splenoportal confluence CT chest 09/07/2020-scattered pulmonary nodules concerning for metastases, pancreas body mass, hepatic metastases Ultrasound-guided biopsy of a right liver lesion 09/08/2020-adenocarcinoma consistent with a pancreas primary CT abdomen/pelvis 09/24/2020-pancreas neck mass, omental and peritoneal nodularity-unchanged, multiple hypoenhancing liver masses-unchanged, numerous small pulmonary nodules Cycle 1 gemcitabine/Abraxane 10/04/2020 Cycle 2 gemcitabine/Abraxane 10/27/2020 Cycle 3 gemcitabine/Abraxane 11/11/2018 Cycle 4 gemcitabine/Abraxane 11/23/2020 Cycle 5 gemcitabine/Abraxane 12/08/2020 CT abdomen/pelvis 12/18/2020-stable pancreas mass, stable and improved liver lesions, resolution of omental soft tissue density, stable indeterminate lung nodules, no disease progression Cycle 6 gemcitabine/Abraxane 12/22/2020 Cycle 7 gemcitabine 01/05/2021, 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  Cycle 8 gemcitabine/Abraxane 01/19/2021 Cycle 9 gemcitabine/Abraxane 02/01/2021 Cycle 10 Gemcitabine 02/16/2021, 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  Pain secondary #1 3.  Diabetes 4.  Hypertension 5.  Osteoarthritis 6.  Port-A-Cath placement 09/22/2020 7.  Admission with jaundice 09/24/2020.  MRI-new abrupt constriction of the common hepatic duct.  The infiltrative pancreatic mass extends medially in the porta hepatis to obstruct the common hepatic duct.  Multiple right hepatic lobe metastases mildly increased in size.  Stent placed into the common bile duct 09/28/2020.      Disposition: Ms. Schillaci appears stable.  She continues to have neuropathy symptoms.  Abraxane will remain on hold.  She will return for another treatment with gemcitabine on 03/05/2021.  She will be scheduled for an office visit on 03/14/2021.  The CA 19-9 was lower on 02/15/2021.  We will follow-up on  the CA 19-9 from today.   Betsy Coder,  MD  03/01/2021  11:16 AM

## 2021-03-02 ENCOUNTER — Inpatient Hospital Stay: Payer: Medicare HMO

## 2021-03-02 LAB — CANCER ANTIGEN 19-9: CA 19-9: 1146 U/mL — ABNORMAL HIGH (ref 0–35)

## 2021-03-04 ENCOUNTER — Other Ambulatory Visit: Payer: Self-pay | Admitting: Oncology

## 2021-03-05 ENCOUNTER — Inpatient Hospital Stay: Payer: Medicare HMO

## 2021-03-05 ENCOUNTER — Other Ambulatory Visit: Payer: Self-pay

## 2021-03-05 VITALS — BP 115/75 | HR 90 | Temp 98.6°F | Resp 20 | Wt 153.2 lb

## 2021-03-05 DIAGNOSIS — C251 Malignant neoplasm of body of pancreas: Secondary | ICD-10-CM | POA: Diagnosis not present

## 2021-03-05 DIAGNOSIS — G893 Neoplasm related pain (acute) (chronic): Secondary | ICD-10-CM | POA: Diagnosis not present

## 2021-03-05 DIAGNOSIS — Z79899 Other long term (current) drug therapy: Secondary | ICD-10-CM | POA: Diagnosis not present

## 2021-03-05 DIAGNOSIS — I10 Essential (primary) hypertension: Secondary | ICD-10-CM | POA: Diagnosis not present

## 2021-03-05 DIAGNOSIS — Z5111 Encounter for antineoplastic chemotherapy: Secondary | ICD-10-CM | POA: Diagnosis not present

## 2021-03-05 DIAGNOSIS — C787 Secondary malignant neoplasm of liver and intrahepatic bile duct: Secondary | ICD-10-CM | POA: Diagnosis not present

## 2021-03-05 DIAGNOSIS — C259 Malignant neoplasm of pancreas, unspecified: Secondary | ICD-10-CM

## 2021-03-05 DIAGNOSIS — E119 Type 2 diabetes mellitus without complications: Secondary | ICD-10-CM | POA: Diagnosis not present

## 2021-03-05 MED ORDER — SODIUM CHLORIDE 0.9 % IV SOLN: INTRAVENOUS | Status: AC

## 2021-03-05 MED ORDER — SODIUM CHLORIDE 0.9 % IV SOLN
800.0000 mg/m2 | Freq: Once | INTRAVENOUS | Status: AC
  Administered 2021-03-05: 09:00:00 1444 mg via INTRAVENOUS
  Filled 2021-03-05: qty 26.3

## 2021-03-05 MED ORDER — HEPARIN SOD (PORK) LOCK FLUSH 100 UNIT/ML IV SOLN
500.0000 [IU] | Freq: Once | INTRAVENOUS | Status: AC | PRN
  Administered 2021-03-05: 10:00:00 500 [IU]

## 2021-03-05 MED ORDER — SODIUM CHLORIDE 0.9 % IV SOLN: Freq: Once | INTRAVENOUS | Status: AC

## 2021-03-05 MED ORDER — PROCHLORPERAZINE MALEATE 10 MG PO TABS
10.0000 mg | ORAL_TABLET | Freq: Once | ORAL | Status: AC
  Administered 2021-03-05: 08:00:00 10 mg via ORAL
  Filled 2021-03-05: qty 1

## 2021-03-05 MED ORDER — SODIUM CHLORIDE 0.9% FLUSH
10.0000 mL | INTRAVENOUS | Status: DC | PRN
  Administered 2021-03-05: 10:00:00 10 mL

## 2021-03-05 NOTE — Progress Notes (Signed)
Patient presents for treatment. RN assessment completed along with the following:  Labs/vitals reviewed - Yes, and within treatment parameters.  03/01/21 Weight within 10% of previous measurement - Yes Oncology Treatment Attestation completed for current therapy- Yes, on date 09/12/20 Informed consent completed and reflects current therapy/intent - Yes, on date 10/12/20             Provider progress note reviewed - Patient not seen by provider today. Most recent note dated 03/01/21 reviewed. Treatment/Antibody/Supportive plan reviewed - Yes, and there are no adjustments needed for today's treatment. Abraxane held S&H and other orders reviewed - Yes, and there are no additional orders identified. Previous treatment date reviewed - Yes, and the appropriate amount of time has elapsed between treatments. Clinic Hand Off Received from - none  Patient to proceed with treatment.

## 2021-03-05 NOTE — Patient Instructions (Signed)
[facility]   Discharge Instructions: Thank you for choosing [facility] to provide your oncology and hematology care.   If you have a lab appointment with the [facility], please go directly to the [facility] and check in at the registration area.   Wear comfortable clothing and clothing appropriate for easy access to any Portacath or PICC line.   We strive to give you quality time with your provider. You may need to reschedule your appointment if you arrive late (15 or more minutes).  Arriving late affects you and other patients whose appointments are after yours.  Also, if you miss three or more appointments without notifying the office, you may be dismissed from the clinic at the providers discretion.      For prescription refill requests, have your pharmacy contact our office and allow 72 hours for refills to be completed.    Today you received the following chemotherapy and/or immunotherapy agents Gemzar       To help prevent nausea and vomiting after your treatment, we encourage you to take your nausea medication as directed.  BELOW ARE SYMPTOMS THAT SHOULD BE REPORTED IMMEDIATELY: *FEVER GREATER THAN 100.4 F (38 C) OR HIGHER *CHILLS OR SWEATING *NAUSEA AND VOMITING THAT IS NOT CONTROLLED WITH YOUR NAUSEA MEDICATION *UNUSUAL SHORTNESS OF BREATH *UNUSUAL BRUISING OR BLEEDING *URINARY PROBLEMS (pain or burning when urinating, or frequent urination) *BOWEL PROBLEMS (unusual diarrhea, constipation, pain near the anus) TENDERNESS IN MOUTH AND THROAT WITH OR WITHOUT PRESENCE OF ULCERS (sore throat, sores in mouth, or a toothache) UNUSUAL RASH, SWELLING OR PAIN  UNUSUAL VAGINAL DISCHARGE OR ITCHING   Items with * indicate a potential emergency and should be followed up as soon as possible or go to the Emergency Department if any problems should occur.  Please show the CHEMOTHERAPY ALERT CARD or IMMUNOTHERAPY ALERT CARD at check-in to the  Emergency Department and triage nurse.  Should you have questions after your visit or need to cancel or reschedule your appointment, please contact [facility]  Dept: [phone]  and follow the prompts.  Office hours are 8:00 a.m. to 4:30 p.m. Monday - Friday. Please note that voicemails left after 4:00 p.m. may not be returned until the following business day.  We are closed weekends and major holidays. You have access to a nurse at all times for urgent questions. Please call the main number to the clinic Dept: [phone] and follow the prompts.   For any non-urgent questions, you may also contact your provider using MyChart. We now offer e-Visits for anyone 58 and older to request care online for non-urgent symptoms. For details visit [email or web address].   Also download the MyChart app! Go to the app store, search "MyChart", open the app, select [facility], and log in with your MyChart username and password.  Due to Covid, a mask is required upon entering the hospital/clinic. If you do not have a mask, one will be given to you upon arrival. For doctor visits, patients may have 1 support person aged 33 or older with them. For treatment visits, patients cannot have anyone with them due to current Covid guidelines and our immunocompromised population.   Gemcitabine injection What is this medication? GEMCITABINE (jem SYE ta been) is a chemotherapy drug. This medicine is used to treat many types of cancer like breast cancer, lung cancer, pancreatic cancer, and ovarian cancer. This medicine may be used for other purposes; ask your health care  provider or pharmacist if you have questions. COMMON BRAND NAME(S): Gemzar, Infugem What should I tell my care team before I take this medication? They need to know if you have any of these conditions: blood disorders infection kidney disease liver disease lung or breathing disease, like asthma recent or ongoing radiation  therapy an unusual or allergic reaction to gemcitabine, other chemotherapy, other medicines, foods, dyes, or preservatives pregnant or trying to get pregnant breast-feeding How should I use this medication? This drug is given as an infusion into a vein. It is administered in a hospital or clinic by a specially trained health care professional. Talk to your pediatrician regarding the use of this medicine in children. Special care may be needed. Overdosage: If you think you have taken too much of this medicine contact a poison control center or emergency room at once. NOTE: This medicine is only for you. Do not share this medicine with others. What if I miss a dose? It is important not to miss your dose. Call your doctor or health care professional if you are unable to keep an appointment. What may interact with this medication? medicines to increase blood counts like filgrastim, pegfilgrastim, sargramostim some other chemotherapy drugs like cisplatin vaccines Talk to your doctor or health care professional before taking any of these medicines: acetaminophen aspirin ibuprofen ketoprofen naproxen This list may not describe all possible interactions. Give your health care provider a list of all the medicines, herbs, non-prescription drugs, or dietary supplements you use. Also tell them if you smoke, drink alcohol, or use illegal drugs. Some items may interact with your medicine. What should I watch for while using this medication? Visit your doctor for checks on your progress. This drug may make you feel generally unwell. This is not uncommon, as chemotherapy can affect healthy cells as well as cancer cells. Report any side effects. Continue your course of treatment even though you feel ill unless your doctor tells you to stop. In some cases, you may be given additional medicines to help with side effects. Follow all directions for their use. Call your doctor or health care professional for  advice if you get a fever, chills or sore throat, or other symptoms of a cold or flu. Do not treat yourself. This drug decreases your body's ability to fight infections. Try to avoid being around people who are sick. This medicine may increase your risk to bruise or bleed. Call your doctor or health care professional if you notice any unusual bleeding. Be careful brushing and flossing your teeth or using a toothpick because you may get an infection or bleed more easily. If you have any dental work done, tell your dentist you are receiving this medicine. Avoid taking products that contain aspirin, acetaminophen, ibuprofen, naproxen, or ketoprofen unless instructed by your doctor. These medicines may hide a fever. Do not become pregnant while taking this medicine or for 6 months after stopping it. Women should inform their doctor if they wish to become pregnant or think they might be pregnant. Men should not father a child while taking this medicine and for 3 months after stopping it. There is a potential for serious side effects to an unborn child. Talk to your health care professional or pharmacist for more information. Do not breast-feed an infant while taking this medicine or for at least 1 week after stopping it. Men should inform their doctors if they wish to father a child. This medicine may lower sperm counts. Talk with your doctor or  health care professional if you are concerned about your fertility. What side effects may I notice from receiving this medication? Side effects that you should report to your doctor or health care professional as soon as possible: allergic reactions like skin rash, itching or hives, swelling of the face, lips, or tongue breathing problems pain, redness, or irritation at site where injected signs and symptoms of a dangerous change in heartbeat or heart rhythm like chest pain; dizziness; fast or irregular heartbeat; palpitations; feeling faint or lightheaded, falls;  breathing problems signs of decreased platelets or bleeding - bruising, pinpoint red spots on the skin, black, tarry stools, blood in the urine signs of decreased red blood cells - unusually weak or tired, feeling faint or lightheaded, falls signs of infection - fever or chills, cough, sore throat, pain or difficulty passing urine signs and symptoms of kidney injury like trouble passing urine or change in the amount of urine signs and symptoms of liver injury like dark yellow or brown urine; general ill feeling or flu-like symptoms; light-colored stools; loss of appetite; nausea; right upper belly pain; unusually weak or tired; yellowing of the eyes or skin swelling of ankles, feet, hands Side effects that usually do not require medical attention (report to your doctor or health care professional if they continue or are bothersome): constipation diarrhea hair loss loss of appetite nausea rash vomiting This list may not describe all possible side effects. Call your doctor for medical advice about side effects. You may report side effects to FDA at 1-800-FDA-1088. Where should I keep my medication? This drug is given in a hospital or clinic and will not be stored at home. NOTE: This sheet is a summary. It may not cover all possible information. If you have questions about this medicine, talk to your doctor, pharmacist, or health care provider.  2022 Elsevier/Gold Standard (2017-05-28 00:00:00)

## 2021-03-09 ENCOUNTER — Encounter: Payer: Self-pay | Admitting: Family Medicine

## 2021-03-09 ENCOUNTER — Ambulatory Visit (INDEPENDENT_AMBULATORY_CARE_PROVIDER_SITE_OTHER): Payer: Medicare HMO | Admitting: Family Medicine

## 2021-03-09 VITALS — BP 110/80 | HR 92 | Temp 97.2°F | Ht 65.0 in | Wt 152.2 lb

## 2021-03-09 DIAGNOSIS — E119 Type 2 diabetes mellitus without complications: Secondary | ICD-10-CM | POA: Diagnosis not present

## 2021-03-09 DIAGNOSIS — F411 Generalized anxiety disorder: Secondary | ICD-10-CM | POA: Diagnosis not present

## 2021-03-09 DIAGNOSIS — I10 Essential (primary) hypertension: Secondary | ICD-10-CM | POA: Diagnosis not present

## 2021-03-09 DIAGNOSIS — C259 Malignant neoplasm of pancreas, unspecified: Secondary | ICD-10-CM | POA: Diagnosis not present

## 2021-03-09 DIAGNOSIS — E538 Deficiency of other specified B group vitamins: Secondary | ICD-10-CM | POA: Diagnosis not present

## 2021-03-09 DIAGNOSIS — E039 Hypothyroidism, unspecified: Secondary | ICD-10-CM

## 2021-03-09 LAB — POCT GLYCOSYLATED HEMOGLOBIN (HGB A1C): Hemoglobin A1C: 6.1 % — AB (ref 4.0–5.6)

## 2021-03-09 NOTE — Patient Instructions (Addendum)
Please return in 3 months for recheck.   If you have any questions or concerns, please don't hesitate to send me a message via MyChart or call the office at [phone]. Thank you for visiting with [place] today! It's our pleasure caring for you.   I have ordered a mammogram and/or bone density for you as we discussed today: [x]   Mammogram  [x]   Bone Density  Please call the office checked below to schedule your appointment:  [x]   [facility]      [address] [place], [place]         []   [facility]  [address] [place], [place]

## 2021-03-09 NOTE — Progress Notes (Signed)
Subjective  CC:  Chief Complaint  Patient presents with   Hyperlipidemia   Diabetes   Hypothyroidism   Pancreatic Cancer    HPI: [name] is a 75 y.o. female who presents to the office today for follow up of diabetes and problems listed above in the chief complaint.  Diabetes follow up: Her diabetic control is reported as Unchanged. Remains diet controlled. Appetite is improving overall but still with chemo induced n/v for 2-3 days after each treatment.  She denies exertional CP or SOB or symptomatic hypoglycemia. She denies foot sores. Has chemo induced neuropathy: onc is adjusting medications to try to improve that.  HTN: on amlodipine 10. Bp are stable/low normal. Denies orthostatic sxs. No palpitations. No edema. Hypothyroidism: levels checked in October were <1.0 ... she denies sxs of hyperglycemia. Her weight is stable.  Pancreatic cancer: responding to treatment. Onc wants a mammogram now.  Anxiety: feeling better overall. Uses intermittent xanax Also due for dexa; I don't have report from her last. No h/o osteoporosis by pt report.   Wt Readings from Last 3 Encounters:  03/09/21 152 lb 3.2 oz (69 kg)  03/05/21 153 lb 3.2 oz (69.5 kg)  03/01/21 154 lb 9.6 oz (70.1 kg)    BP Readings from Last 3 Encounters:  03/09/21 110/80  03/05/21 115/75  03/01/21 130/77    Assessment  1. 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 Diet-controlled diabetes mellitus ([place])   3. Essential hypertension   4. Acquired hypothyroidism   5. Vitamin B12 deficiency   6. GAD (generalized anxiety disorder)      Plan  Diabetes is currently very well controlled. Diet controlled.  HTN: stable on amlodipine. Monitor for hypotension and sxs.  Low thyroid: will monitor q 6 months to ensure stable. Clinically stable now Gad: stale with intermittent xanax use. Has bright outlook  Pancreatic cancer treatment per onc. She is responding.  Mammo and dexa ordered.    Follow up: 3 mo for recheck. Orders Placed This Encounter  Procedures   POCT HgB A1C   No orders of the defined types were placed in this encounter.     Immunization History  Administered Date(s) Administered   Fluad Quad(high Dose 65+) 10/30/2018, 12/08/2019, 01/27/2021   Influenza, High Dose Seasonal PF 12/13/2013, 10/30/2018   Influenza-Unspecified 12/16/2013   Moderna SARS-COV2 Booster Vaccination 01/19/2020, 06/20/2020   Moderna Sars-Covid-2 Vaccination 03/30/2019, 04/30/2019   Pneumococcal Conjugate-13 03/09/2014   Pneumococcal Polysaccharide-23 06/24/2011   Td 12/20/2013   Tdap 02/12/2006   Zoster Recombinat (Shingrix) 07/27/2017    Diabetes Related Lab Review: Lab Results  Component Value Date   HGBA1C 6.1 (A) 03/09/2021   HGBA1C 5.9 (A) 01/11/2021   HGBA1C 7.1 (H) 09/25/2020    Lab Results  Component Value Date   MICROALBUR 0.5 11/08/2019   Lab Results  Component Value Date   CREATININE 0.72 03/01/2021   BUN 17 03/01/2021   NA 138 03/01/2021   K 4.0 03/01/2021   CL 106 03/01/2021   CO2 23 03/01/2021   Lab Results  Component Value Date   CHOL 143 01/11/2021   CHOL 224 (H) 11/08/2019   CHOL 221 (H) 05/25/2012   Lab Results  Component Value Date   HDL 51.20 01/11/2021   HDL 65 11/08/2019   HDL 58 05/25/2012   Lab Results  Component Value Date   LDLCALC 73 01/11/2021   LDLCALC 137 (H) 11/08/2019   LDLCALC 138 (H) 05/25/2012  Lab Results  Component Value Date   TRIG 94.0 01/11/2021   TRIG 110 11/08/2019   TRIG 127 05/25/2012   Lab Results  Component Value Date   CHOLHDL 3 01/11/2021   CHOLHDL 3.4 11/08/2019   CHOLHDL 3.8 05/25/2012   No results found for: LDLDIRECT The 10-year ASCVD risk score (Arnett DK, et al., 2019) is: 34.2%   Values used to calculate the score:     Age: 44 years     Sex: Female     Is Non-Hispanic African American: No     Diabetic: Yes     Tobacco smoker: No     Systolic Blood Pressure: 220 mmHg     Is BP  treated: Yes     HDL Cholesterol: 51.2 mg/dL     Total Cholesterol: 143 mg/dL I have reviewed the PMH, Fam and Soc history. Patient Active Problem List   Diagnosis Date Noted   GAD (generalized anxiety disorder) 10/20/2019    Priority: High   Major depression, recurrent, chronic ([place]) 10/20/2019    Priority: High   Diet-controlled diabetes mellitus ([place]) 10/20/2019    Priority: High   Essential tremor 04/13/2019    Priority: High   Acquired hypothyroidism 05/24/2012    Priority: High   Alcohol abuse, daily use 05/24/2012    Priority: High   Essential hypertension     Priority: High   Mixed hyperlipidemia     Priority: High   Osteoarthritis, multiple sites 10/20/2019    Priority: Medium     Neck, back, hands, shoulders, knees.  He has seen [facility] in the past    Bilateral primary osteoarthritis of knee 09/23/2017    Priority: Medium    Vitamin B12 deficiency 10/20/2019    Priority: Low   Presbycusis of both ears 04/13/2019    Priority: Low   Pancreatic adenocarcinoma ([place]) 09/25/2020    Priority: 1.   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   Hyperbilirubinemia 09/24/2020    Social History: Patient  reports that she quit smoking about 33 years ago. Her smoking use included cigarettes. She has a 10.00 pack-year smoking history. She has never used smokeless tobacco. She reports current alcohol use of about 21.0 standard drinks per week. She reports that she does not use drugs.  Review of Systems: Ophthalmic: negative for eye pain, loss of vision or double vision Cardiovascular: negative for chest pain Respiratory: negative for SOB or persistent cough Gastrointestinal: negative for abdominal pain Genitourinary: negative for dysuria or gross hematuria  Objective  Vitals: BP 110/80    Pulse 92    Temp (!) 97.2 F (36.2 C) (Temporal)    Ht 5\' 5"  (1.651 m)    Wt 152 lb 3.2 oz (69 kg)    SpO2 99%    BMI 25.33 kg/m   General: no acute distress  Psych:  Alert and oriented, normal mood and affect Cardiovascular:  Nl S1 and S2, RRR without murmur, gallop or rub. no edema Respiratory:  Good breath sounds bilaterally, CTAB with normal effort, no rales Ext: no edema    Commons side effects, risks, benefits, and alternatives for medications and treatment plan prescribed today were discussed, and the patient expressed understanding of the given instructions. Patient is instructed to call or message via MyChart if he/she has any questions or concerns regarding our treatment plan. No barriers to understanding were identified. We discussed Red Flag symptoms and signs in detail. Patient expressed understanding regarding  what to do in case of urgent or emergency type symptoms.  Medication list was reconciled, printed and provided to the patient in AVS. Patient instructions and summary information was reviewed with the patient as documented in the AVS. This note was prepared with assistance of Dragon voice recognition software. Occasional wrong-word or sound-a-like substitutions may have occurred due to the inherent limitations of voice recognition software  This visit occurred during the SARS-CoV-2 public health emergency.  Safety protocols were in place, including screening questions prior to the visit, additional usage of staff PPE, and extensive cleaning of exam room while observing appropriate contact time as indicated for disinfecting solutions.

## 2021-03-12 ENCOUNTER — Other Ambulatory Visit: Payer: Self-pay | Admitting: Oncology

## 2021-03-14 ENCOUNTER — Other Ambulatory Visit: Payer: Self-pay

## 2021-03-14 ENCOUNTER — Encounter: Payer: Self-pay | Admitting: Oncology

## 2021-03-14 ENCOUNTER — Inpatient Hospital Stay (HOSPITAL_BASED_OUTPATIENT_CLINIC_OR_DEPARTMENT_OTHER): Payer: Medicare HMO | Admitting: Oncology

## 2021-03-14 ENCOUNTER — Inpatient Hospital Stay: Payer: Medicare HMO

## 2021-03-14 VITALS — BP 129/81 | HR 96 | Temp 98.1°F | Resp 18 | Ht 65.0 in | Wt 155.6 lb

## 2021-03-14 DIAGNOSIS — E119 Type 2 diabetes mellitus without complications: Secondary | ICD-10-CM | POA: Diagnosis not present

## 2021-03-14 DIAGNOSIS — Z5111 Encounter for antineoplastic chemotherapy: Secondary | ICD-10-CM | POA: Diagnosis not present

## 2021-03-14 DIAGNOSIS — Z79899 Other long term (current) drug therapy: Secondary | ICD-10-CM | POA: Diagnosis not present

## 2021-03-14 DIAGNOSIS — C259 Malignant neoplasm of pancreas, unspecified: Secondary | ICD-10-CM | POA: Diagnosis not present

## 2021-03-14 DIAGNOSIS — C251 Malignant neoplasm of body of pancreas: Secondary | ICD-10-CM | POA: Diagnosis not present

## 2021-03-14 DIAGNOSIS — I10 Essential (primary) hypertension: Secondary | ICD-10-CM | POA: Diagnosis not present

## 2021-03-14 DIAGNOSIS — C787 Secondary malignant neoplasm of liver and intrahepatic bile duct: Secondary | ICD-10-CM | POA: Diagnosis not present

## 2021-03-14 DIAGNOSIS — G893 Neoplasm related pain (acute) (chronic): Secondary | ICD-10-CM | POA: Diagnosis not present

## 2021-03-14 LAB — CMP (CANCER CENTER ONLY)
ALT: 17 U/L (ref 0–44)
AST: 16 U/L (ref 15–41)
Albumin: 4 g/dL (ref 3.5–5.0)
Alkaline Phosphatase: 88 U/L (ref 38–126)
Anion gap: 9 (ref 5–15)
BUN: 16 mg/dL (ref 8–23)
CO2: 23 mmol/L (ref 22–32)
Calcium: 9.2 mg/dL (ref 8.9–10.3)
Chloride: 105 mmol/L (ref 98–111)
Creatinine: 0.79 mg/dL (ref 0.44–1.00)
GFR, Estimated: >60 mL/min (ref >60)
Glucose, Bld: 165 mg/dL — ABNORMAL HIGH (ref 70–99)
Potassium: 4 mmol/L (ref 3.5–5.1)
Sodium: 137 mmol/L (ref 135–145)
Total Bilirubin: 0.4 mg/dL (ref 0.3–1.2)
Total Protein: 6.2 g/dL — ABNORMAL LOW (ref 6.5–8.1)

## 2021-03-14 LAB — CBC WITH DIFFERENTIAL (CANCER CENTER ONLY)
Abs Immature Granulocytes: 0.03 10*3/uL (ref 0.00–0.07)
Basophils Absolute: 0 10*3/uL (ref 0.0–0.1)
Basophils Relative: 1 %
Eosinophils Absolute: 0.1 10*3/uL (ref 0.0–0.5)
Eosinophils Relative: 2 %
HCT: 37.9 % (ref 36.0–46.0)
Hemoglobin: 12.8 g/dL (ref 12.0–15.0)
Immature Granulocytes: 0 %
Lymphocytes Relative: 35 %
Lymphs Abs: 3 10*3/uL (ref 0.7–4.0)
MCH: 32.7 pg (ref 26.0–34.0)
MCHC: 33.8 g/dL (ref 30.0–36.0)
MCV: 96.7 fL (ref 80.0–100.0)
Monocytes Absolute: 0.6 10*3/uL (ref 0.1–1.0)
Monocytes Relative: 7 %
Neutro Abs: 4.7 10*3/uL (ref 1.7–7.7)
Neutrophils Relative %: 55 %
Platelet Count: 127 10*3/uL — ABNORMAL LOW (ref 150–400)
RBC: 3.92 MIL/uL (ref 3.87–5.11)
RDW: 14.9 % (ref 11.5–15.5)
WBC Count: 8.4 10*3/uL (ref 4.0–10.5)
nRBC: 0 % (ref 0.0–0.2)

## 2021-03-14 MED ORDER — SODIUM CHLORIDE 0.9% FLUSH
10.0000 mL | Freq: Once | INTRAVENOUS | Status: AC
  Administered 2021-03-14: 11:00:00 10 mL via INTRAVENOUS

## 2021-03-14 MED ORDER — HEPARIN SOD (PORK) LOCK FLUSH 100 UNIT/ML IV SOLN
500.0000 [IU] | Freq: Once | INTRAVENOUS | Status: AC
  Administered 2021-03-14: 11:00:00 500 [IU] via INTRAVENOUS

## 2021-03-14 NOTE — Progress Notes (Signed)
[facility] OFFICE PROGRESS NOTE   Diagnosis: Pancreas cancer  INTERVAL HISTORY:   Ms. Magner completed another treatment with gemcitabine on 03/05/2021.  She has malaise following chemotherapy.  She has noted improvement in the numbness in the fingers.  She has persistent numbness in the feet.  No new complaint.  Objective:  Vital signs in last 24 hours:  Blood pressure 129/81, pulse 96, temperature 98.1 F (36.7 C), temperature source Oral, resp. rate 18, height 5\' 5"  (1.651 m), weight 155 lb 9.6 oz (70.6 kg), SpO2 100 %.    HEENT: No thrush or ulcers Resp: Lungs clear bilaterally Cardio: Regular rate and rhythm GI: No hepatosplenomegaly, no apparent ascites, no mass Vascular: No leg edema  Skin: Palms without erythema  Portacath/PICC-without erythema  Lab Results:  Lab Results  Component Value Date   WBC 8.4 03/14/2021   HGB 12.8 03/14/2021   HCT 37.9 03/14/2021   MCV 96.7 03/14/2021   PLT 127 (L) 03/14/2021   NEUTROABS 4.7 03/14/2021    CMP  Lab Results  Component Value Date   NA 137 03/14/2021   K 4.0 03/14/2021   CL 105 03/14/2021   CO2 23 03/14/2021   GLUCOSE 165 (H) 03/14/2021   BUN 16 03/14/2021   CREATININE 0.79 03/14/2021   CALCIUM 9.2 03/14/2021   PROT 6.2 (L) 03/14/2021   ALBUMIN 4.0 03/14/2021   AST 16 03/14/2021   ALT 17 03/14/2021   ALKPHOS 88 03/14/2021   BILITOT 0.4 03/14/2021   GFRNONAA >60 03/14/2021   GFRAA 71 02/02/2019    Lab Results  Component Value Date   CAN199 1,146 (H) 03/01/2021    Lab Results  Component Value Date   INR 1.1 10/14/2020   LABPROT 14.1 10/14/2020    Imaging:  No results found.  Medications: I have reviewed the patient's current medications.   Assessment/Plan: Adenocarcinoma the pancreas body, stage IV (cT4,cN0,pM1) Ultrasound abdomen 08/18/2020-possible hypoechoic pancreas body mass, small hypoechoic liver areas MRI abdomen 08/27/2020-pancreas body mass, multiple hepatic lesions  consistent with metastases, right abdominal omental nodularity, pancreas mass extends to the celiac bifurcation and abuts the splenic vein and splenoportal confluence CT chest 09/07/2020-scattered pulmonary nodules concerning for metastases, pancreas body mass, hepatic metastases Ultrasound-guided biopsy of a right liver lesion 09/08/2020-adenocarcinoma consistent with a pancreas primary CT abdomen/pelvis 09/24/2020-pancreas neck mass, omental and peritoneal nodularity-unchanged, multiple hypoenhancing liver masses-unchanged, numerous small pulmonary nodules Cycle 1 gemcitabine/Abraxane 10/04/2020 Cycle 2 gemcitabine/Abraxane 10/27/2020 Cycle 3 gemcitabine/Abraxane 11/11/2018 Cycle 4 gemcitabine/Abraxane 11/23/2020 Cycle 5 gemcitabine/Abraxane 12/08/2020 CT abdomen/pelvis 12/18/2020-stable pancreas mass, stable and improved liver lesions, resolution of omental soft tissue density, stable indeterminate lung nodules, no disease progression Cycle 6 gemcitabine/Abraxane 12/22/2020 Cycle 7 gemcitabine 01/05/2021, 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  Cycle 8 gemcitabine/Abraxane 01/19/2021 Cycle 9 gemcitabine/Abraxane 02/01/2021 Cycle 10 Gemcitabine 02/16/2021, Abraxane held due to increased neuropathy Cycle 11 Gemcitabine 03/05/2021, 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 Cycle 12 gemcitabine 03/14/2021, 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  Pain secondary #1 3.  Diabetes 4.  Hypertension 5.  Osteoarthritis 6.  Port-A-Cath placement 09/22/2020 7.  Admission with jaundice 09/24/2020.  MRI-new abrupt constriction of the common hepatic duct.  The infiltrative pancreatic mass extends medially in the porta hepatis to obstruct the common hepatic duct.  Multiple right hepatic lobe metastases mildly increased in size.  Stent placed into the common bile duct 09/28/2020.        Disposition: Ms. Shiflett appears stable.  She has persistent neuropathy symptoms.  Abraxane will remain  on hold.  She will complete a cycle of  gemcitabine tomorrow.  She will return for an office visit in 2 weeks. The CA 19-9 was lower when she was here on 03/01/2021.  We will plan for a restaging CT within the next few months.   Betsy Coder, MD  03/14/2021  11:31 AM

## 2021-03-15 ENCOUNTER — Inpatient Hospital Stay: Payer: Medicare HMO

## 2021-03-15 VITALS — BP 123/74 | HR 64 | Temp 98.6°F | Resp 20

## 2021-03-15 DIAGNOSIS — C259 Malignant neoplasm of pancreas, unspecified: Secondary | ICD-10-CM

## 2021-03-15 DIAGNOSIS — E119 Type 2 diabetes mellitus without complications: Secondary | ICD-10-CM | POA: Diagnosis not present

## 2021-03-15 DIAGNOSIS — I10 Essential (primary) hypertension: Secondary | ICD-10-CM | POA: Diagnosis not present

## 2021-03-15 DIAGNOSIS — C787 Secondary malignant neoplasm of liver and intrahepatic bile duct: Secondary | ICD-10-CM | POA: Diagnosis not present

## 2021-03-15 DIAGNOSIS — Z5111 Encounter for antineoplastic chemotherapy: Secondary | ICD-10-CM | POA: Diagnosis not present

## 2021-03-15 DIAGNOSIS — Z79899 Other long term (current) drug therapy: Secondary | ICD-10-CM | POA: Diagnosis not present

## 2021-03-15 DIAGNOSIS — G893 Neoplasm related pain (acute) (chronic): Secondary | ICD-10-CM | POA: Diagnosis not present

## 2021-03-15 DIAGNOSIS — C251 Malignant neoplasm of body of pancreas: Secondary | ICD-10-CM | POA: Diagnosis not present

## 2021-03-15 LAB — CANCER ANTIGEN 19-9: CA 19-9: 1165 U/mL — ABNORMAL HIGH (ref 0–35)

## 2021-03-15 MED ORDER — HEPARIN SOD (PORK) LOCK FLUSH 100 UNIT/ML IV SOLN
500.0000 [IU] | Freq: Once | INTRAVENOUS | Status: AC | PRN
  Administered 2021-03-15: 11:00:00 500 [IU]

## 2021-03-15 MED ORDER — SODIUM CHLORIDE 0.9 % IV SOLN: INTRAVENOUS | Status: AC

## 2021-03-15 MED ORDER — SODIUM CHLORIDE 0.9% FLUSH
10.0000 mL | INTRAVENOUS | Status: DC | PRN
  Administered 2021-03-15: 11:00:00 10 mL

## 2021-03-15 MED ORDER — SODIUM CHLORIDE 0.9 % IV SOLN
800.0000 mg/m2 | Freq: Once | INTRAVENOUS | Status: AC
  Administered 2021-03-15: 10:00:00 1444 mg via INTRAVENOUS
  Filled 2021-03-15: qty 26.3

## 2021-03-15 MED ORDER — PROCHLORPERAZINE MALEATE 10 MG PO TABS
10.0000 mg | ORAL_TABLET | Freq: Once | ORAL | Status: AC
  Administered 2021-03-15: 10:00:00 10 mg via ORAL
  Filled 2021-03-15: qty 1

## 2021-03-15 NOTE — Progress Notes (Signed)
Patient presents for treatment. RN assessment completed along with the following:  Labs/vitals reviewed - Yes, and within treatment parameters.   Weight within 10% of previous measurement - Yes Oncology Treatment Attestation completed for current therapy- Yes, on date 09/12/2020 Informed consent completed and reflects current therapy/intent - Yes, on date 10/12/2020             Provider progress note reviewed - Patient not seen by provider today. Most recent note dated 03/14/2021 reviewed. Treatment/Antibody/Supportive plan reviewed - Yes, and there are no adjustments needed for today's treatment. S&H and other orders reviewed - Yes, and there are no additional orders identified. Previous treatment date reviewed - Yes, and the appropriate amount of time has elapsed between treatments.   Patient to proceed with treatment.

## 2021-03-15 NOTE — Patient Instructions (Signed)
[facility]  Discharge Instructions: Thank you for choosing [facility] to provide your oncology and hematology care.   If you have a lab appointment with the [facility], please go directly to the [facility] and check in at the registration area.   Wear comfortable clothing and clothing appropriate for easy access to any Portacath or PICC line.   We strive to give you quality time with your provider. You may need to reschedule your appointment if you arrive late (15 or more minutes).  Arriving late affects you and other patients whose appointments are after yours.  Also, if you miss three or more appointments without notifying the office, you may be dismissed from the clinic at the providers discretion.      For prescription refill requests, have your pharmacy contact our office and allow 72 hours for refills to be completed.    Today you received the following chemotherapy and/or immunotherapy agents gemzar  Gemcitabine injection What is this medication? GEMCITABINE (jem SYE ta been) is a chemotherapy drug. This medicine is used to treat many types of cancer like breast cancer, lung cancer, pancreatic cancer, and ovarian cancer. This medicine may be used for other purposes; ask your health care provider or pharmacist if you have questions. COMMON BRAND NAME(S): Gemzar, Infugem What should I tell my care team before I take this medication? They need to know if you have any of these conditions: blood disorders infection kidney disease liver disease lung or breathing disease, like asthma recent or ongoing radiation therapy an unusual or allergic reaction to gemcitabine, other chemotherapy, other medicines, foods, dyes, or preservatives pregnant or trying to get pregnant breast-feeding How should I use this medication? This drug is given as an infusion into a vein. It is administered in a hospital or clinic by a specially trained health care  professional. Talk to your pediatrician regarding the use of this medicine in children. Special care may be needed. Overdosage: If you think you have taken too much of this medicine contact a poison control center or emergency room at once. NOTE: This medicine is only for you. Do not share this medicine with others. What if I miss a dose? It is important not to miss your dose. Call your doctor or health care professional if you are unable to keep an appointment. What may interact with this medication? medicines to increase blood counts like filgrastim, pegfilgrastim, sargramostim some other chemotherapy drugs like cisplatin vaccines Talk to your doctor or health care professional before taking any of these medicines: acetaminophen aspirin ibuprofen ketoprofen naproxen This list may not describe all possible interactions. Give your health care provider a list of all the medicines, herbs, non-prescription drugs, or dietary supplements you use. Also tell them if you smoke, drink alcohol, or use illegal drugs. Some items may interact with your medicine. What should I watch for while using this medication? Visit your doctor for checks on your progress. This drug may make you feel generally unwell. This is not uncommon, as chemotherapy can affect healthy cells as well as cancer cells. Report any side effects. Continue your course of treatment even though you feel ill unless your doctor tells you to stop. In some cases, you may be given additional medicines to help with side effects. Follow all directions for their use. Call your doctor or health care professional for advice if you get a fever, chills or sore throat, or other symptoms of a cold or flu. Do  not treat yourself. This drug decreases your body's ability to fight infections. Try to avoid being around people who are sick. This medicine may increase your risk to bruise or bleed. Call your doctor or health care professional if you notice any  unusual bleeding. Be careful brushing and flossing your teeth or using a toothpick because you may get an infection or bleed more easily. If you have any dental work done, tell your dentist you are receiving this medicine. Avoid taking products that contain aspirin, acetaminophen, ibuprofen, naproxen, or ketoprofen unless instructed by your doctor. These medicines may hide a fever. Do not become pregnant while taking this medicine or for 6 months after stopping it. Women should inform their doctor if they wish to become pregnant or think they might be pregnant. Men should not father a child while taking this medicine and for 3 months after stopping it. There is a potential for serious side effects to an unborn child. Talk to your health care professional or pharmacist for more information. Do not breast-feed an infant while taking this medicine or for at least 1 week after stopping it. Men should inform their doctors if they wish to father a child. This medicine may lower sperm counts. Talk with your doctor or health care professional if you are concerned about your fertility. What side effects may I notice from receiving this medication? Side effects that you should report to your doctor or health care professional as soon as possible: allergic reactions like skin rash, itching or hives, swelling of the face, lips, or tongue breathing problems pain, redness, or irritation at site where injected signs and symptoms of a dangerous change in heartbeat or heart rhythm like chest pain; dizziness; fast or irregular heartbeat; palpitations; feeling faint or lightheaded, falls; breathing problems signs of decreased platelets or bleeding - bruising, pinpoint red spots on the skin, black, tarry stools, blood in the urine signs of decreased red blood cells - unusually weak or tired, feeling faint or lightheaded, falls signs of infection - fever or chills, cough, sore throat, pain or difficulty passing urine signs  and symptoms of kidney injury like trouble passing urine or change in the amount of urine signs and symptoms of liver injury like dark yellow or brown urine; general ill feeling or flu-like symptoms; light-colored stools; loss of appetite; nausea; right upper belly pain; unusually weak or tired; yellowing of the eyes or skin swelling of ankles, feet, hands Side effects that usually do not require medical attention (report to your doctor or health care professional if they continue or are bothersome): constipation diarrhea hair loss loss of appetite nausea rash vomiting This list may not describe all possible side effects. Call your doctor for medical advice about side effects. You may report side effects to FDA at 1-800-FDA-1088. Where should I keep my medication? This drug is given in a hospital or clinic and will not be stored at home. NOTE: This sheet is a summary. It may not cover all possible information. If you have questions about this medicine, talk to your doctor, pharmacist, or health care provider.  2022 Elsevier/Gold Standard (2017-05-28 00:00:00)       To help prevent nausea and vomiting after your treatment, we encourage you to take your nausea medication as directed.  BELOW ARE SYMPTOMS THAT SHOULD BE REPORTED IMMEDIATELY: *FEVER GREATER THAN 100.4 F (38 C) OR HIGHER *CHILLS OR SWEATING *NAUSEA AND VOMITING THAT IS NOT CONTROLLED WITH YOUR NAUSEA MEDICATION *UNUSUAL SHORTNESS OF BREATH *UNUSUAL BRUISING OR  BLEEDING *URINARY PROBLEMS (pain or burning when urinating, or frequent urination) *BOWEL PROBLEMS (unusual diarrhea, constipation, pain near the anus) TENDERNESS IN MOUTH AND THROAT WITH OR WITHOUT PRESENCE OF ULCERS (sore throat, sores in mouth, or a toothache) UNUSUAL RASH, SWELLING OR PAIN  UNUSUAL VAGINAL DISCHARGE OR ITCHING   Items with * indicate a potential emergency and should be followed up as soon as possible or go to the Emergency Department if any  problems should occur.  Please show the CHEMOTHERAPY ALERT CARD or IMMUNOTHERAPY ALERT CARD at check-in to the Emergency Department and triage nurse.  Should you have questions after your visit or need to cancel or reschedule your appointment, please contact [facility]  Dept: [phone]  and follow the prompts.  Office hours are 8:00 a.m. to 4:30 p.m. Monday - Friday. Please note that voicemails left after 4:00 p.m. may not be returned until the following business day.  We are closed weekends and major holidays. You have access to a nurse at all times for urgent questions. Please call the main number to the clinic Dept: [phone] and follow the prompts.   For any non-urgent questions, you may also contact your provider using MyChart. We now offer e-Visits for anyone 71 and older to request care online for non-urgent symptoms. For details visit [email or web address].   Also download the MyChart app! Go to the app store, search "MyChart", open the app, select [facility], and log in with your MyChart username and password.  Due to Covid, a mask is required upon entering the hospital/clinic. If you do not have a mask, one will be given to you upon arrival. For doctor visits, patients may have 1 support person aged 38 or older with them. For treatment visits, patients cannot have anyone with them due to current Covid guidelines and our immunocompromised population.

## 2021-03-20 ENCOUNTER — Telehealth: Payer: Self-pay | Admitting: Family Medicine

## 2021-03-20 NOTE — Telephone Encounter (Signed)
Pt called back and I let her know of note below and she said " Im already doing that, shes not telling me nothing that I dont already know. You know what dont worry about it, im going to get my records and send them to someone else because this office just aint got it" she then told me to make sure I sent that message back

## 2021-03-20 NOTE — Telephone Encounter (Signed)
Pt wanted a callback from Dr Toy Baker nurse about her blood pressure, she said that Dr Jonni Sanger said she didn't want the numbers getting any lower, pt said it has gotten lower, she said it was 114 this morning, she said over the weekend it was down in the 90s, she stopped taking the amLODipine Saturday. She wants to know what she should do. Please advise [phone]. Please advise

## 2021-03-20 NOTE — Telephone Encounter (Signed)
Please advise 

## 2021-03-21 ENCOUNTER — Telehealth: Payer: Self-pay

## 2021-03-21 NOTE — Telephone Encounter (Signed)
Fyi.

## 2021-03-21 NOTE — Telephone Encounter (Signed)
TC from Pt's daughter stating Pt's blood pressure is low and she has stopped taking the medication informed Pt's daughter that Pt's PCP handles her Blood pressure medication and she should give them a call. Pt's daughter stated a call was in to her PCP. Pt's daughter also inquired about Pt's appointment stating she has been not feeling well a little longer then normal. Informed Pt's daughter with each treatment symptoms can be longer asked if Pt was taking her nausea medicine for nausea and is staying hydrated Pt's daughter states she wasn't at first but now she is Pt's daughter also wants to know if her symptoms are worse because she had treatment three days earlier Pt's daughter would like her answer from Dr Benay Spice message sent to Dr. Benay Spice.

## 2021-03-21 NOTE — Telephone Encounter (Signed)
Attempted to call patient, unable to reach.

## 2021-03-22 ENCOUNTER — Other Ambulatory Visit: Payer: Self-pay

## 2021-03-22 NOTE — Telephone Encounter (Signed)
Patient daughter calling back to speak to the [facility] .

## 2021-03-22 NOTE — Telephone Encounter (Signed)
Spoke with patient, gave a verbal understanding °

## 2021-03-22 NOTE — Telephone Encounter (Signed)
Patient states that she was already taking the 5mg  of Amlodipine. She has not taken the medication since Saturday. Her lowest blood pressure over the past week was 106/60. Her BP this morning was 114/74, would like to know what she should do moving forward? She mentioned she had her chemo 4 days early, I let her know that should not effect it but to check with her oncologist as well. She states she will check with oncologist, but would like to run it by you as well.

## 2021-03-28 ENCOUNTER — Inpatient Hospital Stay: Payer: Medicare HMO

## 2021-03-28 ENCOUNTER — Ambulatory Visit: Payer: Medicare HMO | Admitting: Nurse Practitioner

## 2021-03-28 ENCOUNTER — Other Ambulatory Visit: Payer: Medicare HMO

## 2021-03-29 ENCOUNTER — Inpatient Hospital Stay: Payer: Medicare HMO

## 2021-03-30 ENCOUNTER — Other Ambulatory Visit: Payer: Self-pay

## 2021-03-30 ENCOUNTER — Inpatient Hospital Stay: Payer: Medicare HMO

## 2021-03-30 ENCOUNTER — Inpatient Hospital Stay: Payer: Medicare HMO | Admitting: Oncology

## 2021-03-30 ENCOUNTER — Inpatient Hospital Stay: Payer: Medicare HMO | Attending: Hematology

## 2021-03-30 VITALS — BP 135/81 | HR 81 | Temp 97.8°F | Resp 18 | Ht 65.0 in | Wt 153.0 lb

## 2021-03-30 DIAGNOSIS — I10 Essential (primary) hypertension: Secondary | ICD-10-CM | POA: Insufficient documentation

## 2021-03-30 DIAGNOSIS — C259 Malignant neoplasm of pancreas, unspecified: Secondary | ICD-10-CM

## 2021-03-30 DIAGNOSIS — Z5111 Encounter for antineoplastic chemotherapy: Secondary | ICD-10-CM | POA: Insufficient documentation

## 2021-03-30 DIAGNOSIS — C251 Malignant neoplasm of body of pancreas: Secondary | ICD-10-CM | POA: Diagnosis not present

## 2021-03-30 DIAGNOSIS — G893 Neoplasm related pain (acute) (chronic): Secondary | ICD-10-CM | POA: Insufficient documentation

## 2021-03-30 DIAGNOSIS — Z79899 Other long term (current) drug therapy: Secondary | ICD-10-CM | POA: Diagnosis not present

## 2021-03-30 DIAGNOSIS — E119 Type 2 diabetes mellitus without complications: Secondary | ICD-10-CM | POA: Diagnosis not present

## 2021-03-30 DIAGNOSIS — C787 Secondary malignant neoplasm of liver and intrahepatic bile duct: Secondary | ICD-10-CM | POA: Diagnosis not present

## 2021-03-30 DIAGNOSIS — G629 Polyneuropathy, unspecified: Secondary | ICD-10-CM | POA: Diagnosis not present

## 2021-03-30 DIAGNOSIS — Z95828 Presence of other vascular implants and grafts: Secondary | ICD-10-CM

## 2021-03-30 LAB — CMP (CANCER CENTER ONLY)
ALT: 22 U/L (ref 0–44)
AST: 21 U/L (ref 15–41)
Albumin: 4.1 g/dL (ref 3.5–5.0)
Alkaline Phosphatase: 106 U/L (ref 38–126)
Anion gap: 8 (ref 5–15)
BUN: 16 mg/dL (ref 8–23)
CO2: 24 mmol/L (ref 22–32)
Calcium: 9.4 mg/dL (ref 8.9–10.3)
Chloride: 103 mmol/L (ref 98–111)
Creatinine: 0.73 mg/dL (ref 0.44–1.00)
GFR, Estimated: >60 mL/min (ref >60)
Glucose, Bld: 106 mg/dL — ABNORMAL HIGH (ref 70–99)
Potassium: 3.9 mmol/L (ref 3.5–5.1)
Sodium: 135 mmol/L (ref 135–145)
Total Bilirubin: 0.4 mg/dL (ref 0.3–1.2)
Total Protein: 6.7 g/dL (ref 6.5–8.1)

## 2021-03-30 LAB — CBC WITH DIFFERENTIAL (CANCER CENTER ONLY)
Abs Immature Granulocytes: 0.04 10*3/uL (ref 0.00–0.07)
Basophils Absolute: 0.1 10*3/uL (ref 0.0–0.1)
Basophils Relative: 1 %
Eosinophils Absolute: 0.1 10*3/uL (ref 0.0–0.5)
Eosinophils Relative: 1 %
HCT: 39.5 % (ref 36.0–46.0)
Hemoglobin: 13 g/dL (ref 12.0–15.0)
Immature Granulocytes: 0 %
Lymphocytes Relative: 21 %
Lymphs Abs: 2.1 10*3/uL (ref 0.7–4.0)
MCH: 31.9 pg (ref 26.0–34.0)
MCHC: 32.9 g/dL (ref 30.0–36.0)
MCV: 96.8 fL (ref 80.0–100.0)
Monocytes Absolute: 0.8 10*3/uL (ref 0.1–1.0)
Monocytes Relative: 8 %
Neutro Abs: 6.9 10*3/uL (ref 1.7–7.7)
Neutrophils Relative %: 69 %
Platelet Count: 220 10*3/uL (ref 150–400)
RBC: 4.08 MIL/uL (ref 3.87–5.11)
RDW: 15.1 % (ref 11.5–15.5)
WBC Count: 10 10*3/uL (ref 4.0–10.5)
nRBC: 0 % (ref 0.0–0.2)

## 2021-03-30 MED ORDER — PREDNISONE 50 MG PO TABS: ORAL_TABLET | ORAL | 0 refills | Status: DC

## 2021-03-30 MED ORDER — SODIUM CHLORIDE 0.9% FLUSH
10.0000 mL | Freq: Once | INTRAVENOUS | Status: AC
  Administered 2021-03-30: 10 mL via INTRAVENOUS

## 2021-03-30 MED ORDER — HEPARIN SOD (PORK) LOCK FLUSH 100 UNIT/ML IV SOLN
500.0000 [IU] | Freq: Once | INTRAVENOUS | Status: AC
  Administered 2021-03-30: 500 [IU] via INTRAVENOUS

## 2021-03-30 NOTE — Patient Instructions (Signed)

## 2021-03-30 NOTE — Progress Notes (Signed)
[name] OFFICE PROGRESS NOTE   Diagnosis: Pancreas cancer  INTERVAL HISTORY:   [name] returns as scheduled.  She was last treated with gemcitabine 03/15/2021.  She reports increased malaise following the most recent chemotherapy.  Neuropathy symptoms in the hands and feet are partially improved.  She reports loss of taste.  She is eating.  No pain.  Objective:  Vital signs in last 24 hours:  Blood pressure 135/81, pulse 81, temperature 97.8 F (36.6 C), temperature source Oral, resp. rate 18, height 5\' 5"  (1.651 m), weight 153 lb (69.4 kg), SpO2 98 %.    HEENT: No thrush or ulcer Resp: Lungs clear bilaterally Cardio: Regular rate and rhythm GI: No hepatosplenomegaly, nontender Vascular: No leg edema  Portacath/PICC-without erythema  Lab Results:  Lab Results  Component Value Date   WBC 10.0 03/30/2021   HGB 13.0 03/30/2021   HCT 39.5 03/30/2021   MCV 96.8 03/30/2021   PLT 220 03/30/2021   NEUTROABS 6.9 03/30/2021    CMP  Lab Results  Component Value Date   NA 137 03/14/2021   K 4.0 03/14/2021   CL 105 03/14/2021   CO2 23 03/14/2021   GLUCOSE 165 (H) 03/14/2021   BUN 16 03/14/2021   CREATININE 0.79 03/14/2021   CALCIUM 9.2 03/14/2021   PROT 6.2 (L) 03/14/2021   ALBUMIN 4.0 03/14/2021   AST 16 03/14/2021   ALT 17 03/14/2021   ALKPHOS 88 03/14/2021   BILITOT 0.4 03/14/2021   GFRNONAA >60 03/14/2021   GFRAA 71 02/02/2019    Lab Results  Component Value Date   CAN199 1,165 (H) 03/14/2021    Lab Results  Component Value Date   INR 1.1 10/14/2020   LABPROT 14.1 10/14/2020    Imaging:  No results found.  Medications: I have reviewed the patient's current medications.   Assessment/Plan: Adenocarcinoma the pancreas body, stage IV (cT4,cN0,pM1) Ultrasound abdomen 08/18/2020-possible hypoechoic pancreas body mass, small hypoechoic liver areas MRI abdomen 08/27/2020-pancreas body mass, multiple hepatic lesions consistent with  metastases, right abdominal omental nodularity, pancreas mass extends to the celiac bifurcation and abuts the splenic vein and splenoportal confluence CT chest 09/07/2020-scattered pulmonary nodules concerning for metastases, pancreas body mass, hepatic metastases Ultrasound-guided biopsy of a right liver lesion 09/08/2020-adenocarcinoma consistent with a pancreas primary CT abdomen/pelvis 09/24/2020-pancreas neck mass, omental and peritoneal nodularity-unchanged, multiple hypoenhancing liver masses-unchanged, numerous small pulmonary nodules Cycle 1 gemcitabine/Abraxane 10/04/2020 Cycle 2 gemcitabine/Abraxane 10/27/2020 Cycle 3 gemcitabine/Abraxane 11/11/2018 Cycle 4 gemcitabine/Abraxane 11/23/2020 Cycle 5 gemcitabine/Abraxane 12/08/2020 CT abdomen/pelvis 12/18/2020-stable pancreas mass, stable and improved liver lesions, resolution of omental soft tissue density, stable indeterminate lung nodules, no disease progression Cycle 6 gemcitabine/Abraxane 12/22/2020 Cycle 7 gemcitabine 01/05/2021, 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  Cycle 8 gemcitabine/Abraxane 01/19/2021 Cycle 9 gemcitabine/Abraxane 02/01/2021 Cycle 10 Gemcitabine 02/16/2021, Abraxane held due to increased neuropathy Cycle 11 Gemcitabine 03/05/2021, 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 Cycle 12 gemcitabine 03/14/2021, 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 Cycle 13 gemcitabine 04/02/2021, 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  Pain secondary #1 3.  Diabetes 4.  Hypertension 5.  Osteoarthritis 6.  Port-A-Cath placement 09/22/2020 7.  Admission with jaundice 09/24/2020.  MRI-new abrupt constriction of the common hepatic duct.  The infiltrative pancreatic mass extends medially in the porta hepatis to obstruct the common hepatic duct.  Multiple right hepatic lobe metastases mildly increased in size.  Stent placed into the common bile duct 09/28/2020.       Disposition: [name] appears stable.  She has persistent neuropathy  symptoms.  Abraxane will remain  on hold.  She will return for gemcitabine on 04/02/2021.  She is scheduled for an office visit 04/13/2021.  We will schedule a restaging CT at the next office visit.  Betsy Coder, MD  03/30/2021  1:00 PM

## 2021-03-31 LAB — CANCER ANTIGEN 19-9: CA 19-9: 1581 U/mL — ABNORMAL HIGH (ref 0–35)

## 2021-04-02 ENCOUNTER — Telehealth: Payer: Self-pay

## 2021-04-02 ENCOUNTER — Inpatient Hospital Stay: Payer: Medicare HMO

## 2021-04-02 ENCOUNTER — Ambulatory Visit: Payer: Medicare HMO

## 2021-04-02 ENCOUNTER — Other Ambulatory Visit: Payer: Self-pay

## 2021-04-02 VITALS — BP 133/74 | HR 75 | Temp 98.1°F | Resp 18 | Ht 65.0 in | Wt 153.0 lb

## 2021-04-02 DIAGNOSIS — I10 Essential (primary) hypertension: Secondary | ICD-10-CM | POA: Diagnosis not present

## 2021-04-02 DIAGNOSIS — Z79899 Other long term (current) drug therapy: Secondary | ICD-10-CM | POA: Diagnosis not present

## 2021-04-02 DIAGNOSIS — E119 Type 2 diabetes mellitus without complications: Secondary | ICD-10-CM | POA: Diagnosis not present

## 2021-04-02 DIAGNOSIS — Z5111 Encounter for antineoplastic chemotherapy: Secondary | ICD-10-CM | POA: Diagnosis not present

## 2021-04-02 DIAGNOSIS — C259 Malignant neoplasm of pancreas, unspecified: Secondary | ICD-10-CM

## 2021-04-02 DIAGNOSIS — C787 Secondary malignant neoplasm of liver and intrahepatic bile duct: Secondary | ICD-10-CM | POA: Diagnosis not present

## 2021-04-02 DIAGNOSIS — C251 Malignant neoplasm of body of pancreas: Secondary | ICD-10-CM | POA: Diagnosis not present

## 2021-04-02 DIAGNOSIS — G893 Neoplasm related pain (acute) (chronic): Secondary | ICD-10-CM | POA: Diagnosis not present

## 2021-04-02 DIAGNOSIS — G629 Polyneuropathy, unspecified: Secondary | ICD-10-CM | POA: Diagnosis not present

## 2021-04-02 MED ORDER — SODIUM CHLORIDE 0.9 % IV SOLN: INTRAVENOUS | Status: AC

## 2021-04-02 MED ORDER — HEPARIN SOD (PORK) LOCK FLUSH 100 UNIT/ML IV SOLN
500.0000 [IU] | Freq: Once | INTRAVENOUS | Status: AC | PRN
  Administered 2021-04-02: 500 [IU]

## 2021-04-02 MED ORDER — SODIUM CHLORIDE 0.9% FLUSH
10.0000 mL | INTRAVENOUS | Status: DC | PRN
  Administered 2021-04-02: 10 mL

## 2021-04-02 MED ORDER — PROCHLORPERAZINE MALEATE 10 MG PO TABS
10.0000 mg | ORAL_TABLET | Freq: Once | ORAL | Status: AC
  Administered 2021-04-02: 10 mg via ORAL
  Filled 2021-04-02: qty 1

## 2021-04-02 MED ORDER — SODIUM CHLORIDE 0.9 % IV SOLN
800.0000 mg/m2 | Freq: Once | INTRAVENOUS | Status: AC
  Administered 2021-04-02: 1444 mg via INTRAVENOUS
  Filled 2021-04-02: qty 26.3

## 2021-04-02 NOTE — Telephone Encounter (Signed)
Would like to discuss a test or possible referral to a neurologist. Daughter was advised that they may have to stop the chemo treatment due to neuropathy. They a very upset and worried and would like a call today if possible. I advised Dr. Jonni Sanger out of the office, but her medical assistant will be back tomorrow.

## 2021-04-02 NOTE — Patient Instructions (Signed)
[facility]  Discharge Instructions: Thank you for choosing [facility] to provide your oncology and hematology care.   If you have a lab appointment with the [facility], please go directly to the [facility] and check in at the registration area.   Wear comfortable clothing and clothing appropriate for easy access to any Portacath or PICC line.   We strive to give you quality time with your provider. You may need to reschedule your appointment if you arrive late (15 or more minutes).  Arriving late affects you and other patients whose appointments are after yours.  Also, if you miss three or more appointments without notifying the office, you may be dismissed from the clinic at the providers discretion.      For prescription refill requests, have your pharmacy contact our office and allow 72 hours for refills to be completed.    Today you received the following chemotherapy and/or immunotherapy agents gemzar  Gemcitabine injection What is this medication? GEMCITABINE (jem SYE ta been) is a chemotherapy drug. This medicine is used to treat many types of cancer like breast cancer, lung cancer, pancreatic cancer, and ovarian cancer. This medicine may be used for other purposes; ask your health care provider or pharmacist if you have questions. COMMON BRAND NAME(S): Gemzar, Infugem What should I tell my care team before I take this medication? They need to know if you have any of these conditions: blood disorders infection kidney disease liver disease lung or breathing disease, like asthma recent or ongoing radiation therapy an unusual or allergic reaction to gemcitabine, other chemotherapy, other medicines, foods, dyes, or preservatives pregnant or trying to get pregnant breast-feeding How should I use this medication? This drug is given as an infusion into a vein. It is administered in a hospital or clinic by a specially trained health care  professional. Talk to your pediatrician regarding the use of this medicine in children. Special care may be needed. Overdosage: If you think you have taken too much of this medicine contact a poison control center or emergency room at once. NOTE: This medicine is only for you. Do not share this medicine with others. What if I miss a dose? It is important not to miss your dose. Call your doctor or health care professional if you are unable to keep an appointment. What may interact with this medication? medicines to increase blood counts like filgrastim, pegfilgrastim, sargramostim some other chemotherapy drugs like cisplatin vaccines Talk to your doctor or health care professional before taking any of these medicines: acetaminophen aspirin ibuprofen ketoprofen naproxen This list may not describe all possible interactions. Give your health care provider a list of all the medicines, herbs, non-prescription drugs, or dietary supplements you use. Also tell them if you smoke, drink alcohol, or use illegal drugs. Some items may interact with your medicine. What should I watch for while using this medication? Visit your doctor for checks on your progress. This drug may make you feel generally unwell. This is not uncommon, as chemotherapy can affect healthy cells as well as cancer cells. Report any side effects. Continue your course of treatment even though you feel ill unless your doctor tells you to stop. In some cases, you may be given additional medicines to help with side effects. Follow all directions for their use. Call your doctor or health care professional for advice if you get a fever, chills or sore throat, or other symptoms of a cold or flu. Do  not treat yourself. This drug decreases your body's ability to fight infections. Try to avoid being around people who are sick. This medicine may increase your risk to bruise or bleed. Call your doctor or health care professional if you notice any  unusual bleeding. Be careful brushing and flossing your teeth or using a toothpick because you may get an infection or bleed more easily. If you have any dental work done, tell your dentist you are receiving this medicine. Avoid taking products that contain aspirin, acetaminophen, ibuprofen, naproxen, or ketoprofen unless instructed by your doctor. These medicines may hide a fever. Do not become pregnant while taking this medicine or for 6 months after stopping it. Women should inform their doctor if they wish to become pregnant or think they might be pregnant. Men should not father a child while taking this medicine and for 3 months after stopping it. There is a potential for serious side effects to an unborn child. Talk to your health care professional or pharmacist for more information. Do not breast-feed an infant while taking this medicine or for at least 1 week after stopping it. Men should inform their doctors if they wish to father a child. This medicine may lower sperm counts. Talk with your doctor or health care professional if you are concerned about your fertility. What side effects may I notice from receiving this medication? Side effects that you should report to your doctor or health care professional as soon as possible: allergic reactions like skin rash, itching or hives, swelling of the face, lips, or tongue breathing problems pain, redness, or irritation at site where injected signs and symptoms of a dangerous change in heartbeat or heart rhythm like chest pain; dizziness; fast or irregular heartbeat; palpitations; feeling faint or lightheaded, falls; breathing problems signs of decreased platelets or bleeding - bruising, pinpoint red spots on the skin, black, tarry stools, blood in the urine signs of decreased red blood cells - unusually weak or tired, feeling faint or lightheaded, falls signs of infection - fever or chills, cough, sore throat, pain or difficulty passing urine signs  and symptoms of kidney injury like trouble passing urine or change in the amount of urine signs and symptoms of liver injury like dark yellow or brown urine; general ill feeling or flu-like symptoms; light-colored stools; loss of appetite; nausea; right upper belly pain; unusually weak or tired; yellowing of the eyes or skin swelling of ankles, feet, hands Side effects that usually do not require medical attention (report to your doctor or health care professional if they continue or are bothersome): constipation diarrhea hair loss loss of appetite nausea rash vomiting This list may not describe all possible side effects. Call your doctor for medical advice about side effects. You may report side effects to FDA at 1-800-FDA-1088. Where should I keep my medication? This drug is given in a hospital or clinic and will not be stored at home. NOTE: This sheet is a summary. It may not cover all possible information. If you have questions about this medicine, talk to your doctor, pharmacist, or health care provider.  2022 Elsevier/Gold Standard (2017-05-28 00:00:00)       To help prevent nausea and vomiting after your treatment, we encourage you to take your nausea medication as directed.  BELOW ARE SYMPTOMS THAT SHOULD BE REPORTED IMMEDIATELY: *FEVER GREATER THAN 100.4 F (38 C) OR HIGHER *CHILLS OR SWEATING *NAUSEA AND VOMITING THAT IS NOT CONTROLLED WITH YOUR NAUSEA MEDICATION *UNUSUAL SHORTNESS OF BREATH *UNUSUAL BRUISING OR  BLEEDING *URINARY PROBLEMS (pain or burning when urinating, or frequent urination) *BOWEL PROBLEMS (unusual diarrhea, constipation, pain near the anus) TENDERNESS IN MOUTH AND THROAT WITH OR WITHOUT PRESENCE OF ULCERS (sore throat, sores in mouth, or a toothache) UNUSUAL RASH, SWELLING OR PAIN  UNUSUAL VAGINAL DISCHARGE OR ITCHING   Items with * indicate a potential emergency and should be followed up as soon as possible or go to the Emergency Department if any  problems should occur.  Please show the CHEMOTHERAPY ALERT CARD or IMMUNOTHERAPY ALERT CARD at check-in to the Emergency Department and triage nurse.  Should you have questions after your visit or need to cancel or reschedule your appointment, please contact [facility]  Dept: [phone]  and follow the prompts.  Office hours are 8:00 a.m. to 4:30 p.m. Monday - Friday. Please note that voicemails left after 4:00 p.m. may not be returned until the following business day.  We are closed weekends and major holidays. You have access to a nurse at all times for urgent questions. Please call the main number to the clinic Dept: [phone] and follow the prompts.   For any non-urgent questions, you may also contact your provider using MyChart. We now offer e-Visits for anyone 64 and older to request care online for non-urgent symptoms. For details visit [email or web address].   Also download the MyChart app! Go to the app store, search "MyChart", open the app, select [facility], and log in with your MyChart username and password.  Due to Covid, a mask is required upon entering the hospital/clinic. If you do not have a mask, one will be given to you upon arrival. For doctor visits, patients may have 1 support person aged 47 or older with them. For treatment visits, patients cannot have anyone with them due to current Covid guidelines and our immunocompromised population.

## 2021-04-02 NOTE — Telephone Encounter (Signed)
TC from Pt's daughter inquiring about abraxane being on hold. Informed Pt's daughter that per Dr Gearldine Shown note abraxane will be on hold for today's treatment and discussed with Dr Benay Spice when he will restart abraxane. Dr Benay Spice stated if Pt's neuropathy symptoms are better he will place Pt back on abraxane. Pt daughter verbalized understanding.

## 2021-04-02 NOTE — Progress Notes (Signed)
Patient presents for treatment. RN assessment completed along with the following:  Labs/vitals reviewed - Yes, and within treatment parameters.   Weight within 10% of previous measurement - Yes Oncology Treatment Attestation completed for current therapy- Yes, on date 09/12/20 Informed consent completed and reflects current therapy/intent - Yes, on date 10/12/20             Provider progress note reviewed - Patient not seen by provider today. Most recent note dated 03/30/21 reviewed. Treatment/Antibody/Supportive plan reviewed - Yes, and there are no adjustments needed for today's treatment. S&H and other orders reviewed - Yes, and there are no additional orders identified. Previous treatment date reviewed - Yes, and the appropriate amount of time has elapsed between treatments.  Patient to proceed with treatment.

## 2021-04-03 ENCOUNTER — Ambulatory Visit: Payer: Medicare HMO

## 2021-04-04 ENCOUNTER — Other Ambulatory Visit: Payer: Self-pay

## 2021-04-04 DIAGNOSIS — C259 Malignant neoplasm of pancreas, unspecified: Secondary | ICD-10-CM

## 2021-04-06 ENCOUNTER — Telehealth: Payer: Self-pay | Admitting: Family Medicine

## 2021-04-06 NOTE — Telephone Encounter (Signed)
Spoke with pt's daughter and Apologized for the delayed response on her request. I went over the 2 options that Dr.Hunter suggested. Pt's daughter agreed to wait till the upcoming appointment 04/11/21 and discuss with Dr. Jonni Sanger. She also has a concern about an order for Bone density and MRI that was ordered back in June and never got scheduled. I told pt's daughter that I am going to look into that, she stated that she needs answers on the day of visits. I, again, Apologized for any incontinence this may have caused her and her mom. She verbalized appreciation for calling her back.  Routing to Dr. Jonni Sanger to look at it when she comes back next week.

## 2021-04-06 NOTE — Telephone Encounter (Signed)
Pt's daughter Langston Reusing - [phone]) called very upset regarding no communication from our office regarding pt needing to see Dr. 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 Pt is a Dr. Jonni Sanger patient, and they have been waiting on a response since Monday on whether patient needs to be seen by Dr. Jonni Sanger to resume Chemo at the [facility]. Daughter is very upset. I did schedule the pt to see Dr. Jonni Sanger on Wednesday 04/11/2021 at 8:30am. [name] has requested a call back from [facility] also.       [facility].

## 2021-04-06 NOTE — Telephone Encounter (Signed)
See telephone encounter from 04/02/21

## 2021-04-06 NOTE — Telephone Encounter (Signed)
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 Neurology referrals can take weeks or even months and even then if this is truly related to chemotherapy then treatments would really only address symptoms. There could be secondary causes but that could likely have workup started with [name] and they see her on the 25th- very reasonable to wait until that visit and that is my preferred option.   If they still want referral you can enter neurology referral under my name under chemotherapy induced neuropathy but once again I think waiting until the 25th make sense

## 2021-04-09 ENCOUNTER — Telehealth: Payer: Self-pay

## 2021-04-09 NOTE — Telephone Encounter (Signed)
States patient is requesting new meter.    Is requesting script for Accu-Chek Aviva plus Meter, test strips  and needles to be sent over.

## 2021-04-10 ENCOUNTER — Other Ambulatory Visit: Payer: Self-pay

## 2021-04-10 DIAGNOSIS — E119 Type 2 diabetes mellitus without complications: Secondary | ICD-10-CM

## 2021-04-10 MED ORDER — ACCU-CHEK GUIDE W/DEVICE KIT: PACK | 0 refills | Status: DC

## 2021-04-10 MED ORDER — ACCU-CHEK AVIVA PLUS VI STRP: ORAL_STRIP | 3 refills | Status: DC

## 2021-04-10 MED ORDER — ACCU-CHEK SOFTCLIX LANCETS MISC: 3 refills | Status: DC

## 2021-04-11 ENCOUNTER — Encounter: Payer: Self-pay | Admitting: Family Medicine

## 2021-04-11 ENCOUNTER — Other Ambulatory Visit: Payer: Self-pay

## 2021-04-11 ENCOUNTER — Ambulatory Visit (INDEPENDENT_AMBULATORY_CARE_PROVIDER_SITE_OTHER): Payer: Medicare HMO | Admitting: Family Medicine

## 2021-04-11 VITALS — BP 119/80 | HR 101 | Temp 97.9°F | Ht 65.0 in

## 2021-04-11 DIAGNOSIS — E538 Deficiency of other specified B group vitamins: Secondary | ICD-10-CM

## 2021-04-11 DIAGNOSIS — M159 Polyosteoarthritis, unspecified: Secondary | ICD-10-CM | POA: Diagnosis not present

## 2021-04-11 DIAGNOSIS — E119 Type 2 diabetes mellitus without complications: Secondary | ICD-10-CM | POA: Diagnosis not present

## 2021-04-11 DIAGNOSIS — C259 Malignant neoplasm of pancreas, unspecified: Secondary | ICD-10-CM

## 2021-04-11 DIAGNOSIS — G609 Hereditary and idiopathic neuropathy, unspecified: Secondary | ICD-10-CM

## 2021-04-11 MED ORDER — CYANOCOBALAMIN 1000 MCG/ML IJ SOLN
1000.0000 ug | Freq: Once | INTRAMUSCULAR | Status: AC
  Administered 2021-04-11: 10:00:00 1000 ug via INTRAMUSCULAR

## 2021-04-11 NOTE — Addendum Note (Signed)
Addended by: Verne Grain on: 04/11/2021 09:48 AM   Modules accepted: Orders

## 2021-04-11 NOTE — Patient Instructions (Addendum)
Please follow up in 3 months for recheck. Please cancel the appt on 04/18/2021  We will call you to get you set up for nerve testing and neurology evaluation to help clarify your symptoms and ability to add back chemo.   If you have any questions or concerns, please don't hesitate to send me a message via MyChart or call the office at [phone]. Thank you for visiting with [place] today! It's our pleasure caring for you.   I have ordered a mammogram and/or bone density for you as we discussed today: [x]   Mammogram  []   Bone Density  Please call the office checked below to schedule your appointment:  []   [facility]      [address] [place], [place]         [x]   [facility]  [address] [place], [place]

## 2021-04-11 NOTE — Progress Notes (Signed)
Subjective  CC:  Chief Complaint  Patient presents with   Neurologic Problem    Pt can grab and pick up things. Also toes are numb.    HPI: [name] is a 76 y.o. female who presents to the office today to address the problems listed above in the chief complaint. 76 year old with metastatic pancreatic cancer presents due to possible worsening neuropathy.  She reports that she has had some numbness in the tips of her toes and fingers for several years, however symptoms have worsened since starting chemotherapy.  She now describes numbness in all of her toes, not just the tips anymore.  Also feeling less sensation in her fingertips.  She is noted it is hard to hold onto things and sometimes drops things.  She thinks this has been related to decrease sensation not weakness per se.  She does have osteoarthritis in her hands but denies pain.  She does have stiffness at times.  No swollen hot joints.  She has had diabetes for about a year.  She also was a regular alcohol user.  She denies weakness.  She would like to know what the diagnosis is and if there is any treatment.  She would also like to restart her chemotherapy drug.  She is diet controlled diabetes, normal TSH in October and B12 levels are stable. Vitamin B12 deficiency: Due for her monthly injection. Pancreatic cancer with metastasis: Due for staging CT at her next oncology visit.  Tolerating chemo well. Health maintenance: Patient would like to get a mammogram.  I no longer recommend bone density screenings.  Assessment  1. Idiopathic peripheral neuropathy   2. Vitamin B12 deficiency   3. 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)   4. Primary osteoarthritis involving multiple joints   5. Diet-controlled diabetes mellitus ([place])      Plan  Neuropathy: Symptoms are most consistent with neuropathy.  Possibly idiopathic, alcohol-related, diabetes related, chemotherapy induced or other.  We will check nerve  conduction studies with EMG and refer to neurology for further input.  She wants help with this so that she could possibly restart her chemotherapeutic agents. B12 injection given today. Osteoarthritis of the hands: Multiple deformities but denies pain. Controlled diabetes is stable  Follow up:    3 months for recheck   Orders Placed This Encounter  Procedures   Ambulatory referral to Neurology   NCV with EMG(electromyography)   No orders of the defined types were placed in this encounter.     I reviewed the patients updated PMH, FH, and SocHx.    Patient Active Problem List   Diagnosis Date Noted   GAD (generalized anxiety disorder) 10/20/2019    Priority: High   Major depression, recurrent, chronic ([place]) 10/20/2019    Priority: High   Diet-controlled diabetes mellitus ([place]) 10/20/2019    Priority: High   Essential tremor 04/13/2019    Priority: High   Acquired hypothyroidism 05/24/2012    Priority: High   Alcohol abuse, daily use 05/24/2012    Priority: High   Essential hypertension     Priority: High   Mixed hyperlipidemia     Priority: High   Osteoarthritis, multiple sites 10/20/2019    Priority: Medium    Bilateral primary osteoarthritis of knee 09/23/2017    Priority: Medium    Vitamin B12 deficiency 10/20/2019    Priority: Low   Presbycusis of both ears 04/13/2019    Priority: Low   Pancreatic adenocarcinoma ([place]) 09/25/2020  Priority: 1.   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   Hyperbilirubinemia 09/24/2020   Current Meds  Medication Sig   ACCU-CHEK AVIVA PLUS test strip As needed   Accu-Chek Softclix Lancets lancets As needed   ALPRAZolam (XANAX) 0.5 MG tablet Take 1 tablet (0.5 mg total) by mouth daily as needed for anxiety.   amLODipine (NORVASC) 5 MG tablet Take 5 mg by mouth daily.   aspirin EC 81 MG tablet Take 81 mg by mouth every morning.   Blood Glucose Monitoring Suppl (ACCU-CHEK GUIDE)  w/Device KIT As needed   cyanocobalamin (,VITAMIN B-12,) 1000 MCG/ML injection Inject 1 mL (1,000 mcg total) into the muscle every 30 (thirty) days.   docusate sodium (COLACE) 100 MG capsule Take 100 mg by mouth 2 (two) times daily as needed (constipation).   levothyroxine (SYNTHROID) 125 MCG tablet TAKE 1 TABLET ON AN EMPTY STOMACH IN THE MORNING (Patient taking differently: Take 125 mcg by mouth daily before breakfast.)   lidocaine-prilocaine (EMLA) cream APPLY 1 APPLICATION TOPICALLY AS NEEDED.   omeprazole (PRILOSEC) 20 MG capsule TAKE 1 CAPSULE BY MOUTH EVERY DAY   prochlorperazine (COMPAZINE) 10 MG tablet Take 1 tablet (10 mg total) by mouth every 6 (six) hours as needed (Nausea or vomiting).    Allergies: Patient is allergic to penicillins. Family History: Patient family history includes Alcohol abuse in her brother; Alzheimer's disease in her mother; COPD in her father; Cancer in an other family member; Dementia in her mother; Diabetes in her mother; Emphysema in her brother and father; Heart attack in her daughter; Heart disease in her brother and sister. Social History:  Patient  reports that she quit smoking about 33 years ago. Her smoking use included cigarettes. She has a 10.00 pack-year smoking history. She has never used smokeless tobacco. She reports current alcohol use of about 21.0 standard drinks per week. She reports that she does not use drugs.  Review of Systems: Constitutional: Negative for fever malaise or anorexia Cardiovascular: negative for chest pain Respiratory: negative for SOB or persistent cough Gastrointestinal: negative for abdominal pain  Objective  Vitals: BP 119/80    Pulse (!) 101    Temp 97.9 F (36.6 C) (Temporal)    Ht $R'5\' 5"'zG$  (1.651 m)    SpO2 98%    BMI 25.46 kg/m  General: no acute distress , A&Ox3 Bilateral hands with osteoarthritic changes, no tender swollen or warm joints.  She has decreased sensation in the fingertips but is able to feel  pressure.  No foot sores. Skin:  Warm, no rashes    Commons side effects, risks, benefits, and alternatives for medications and treatment plan prescribed today were discussed, and the patient expressed understanding of the given instructions. Patient is instructed to call or message via MyChart if he/she has any questions or concerns regarding our treatment plan. No barriers to understanding were identified. We discussed Red Flag symptoms and signs in detail. Patient expressed understanding regarding what to do in case of urgent or emergency type symptoms.  Medication list was reconciled, printed and provided to the patient in AVS. Patient instructions and summary information was reviewed with the patient as documented in the AVS. This note was prepared with assistance of Dragon voice recognition software. Occasional wrong-word or sound-a-like substitutions may have occurred due to the inherent limitations of voice recognition software  This visit occurred during the SARS-CoV-2 public health emergency.  Safety protocols were in place, including screening  questions prior to the visit, additional usage of staff PPE, and extensive cleaning of exam room while observing appropriate contact time as indicated for disinfecting solutions.

## 2021-04-11 NOTE — Progress Notes (Deleted)
Pt here for B12 injection for Dr. Andy. Injection given in left deltoid. Pt tolerated well.    

## 2021-04-13 ENCOUNTER — Inpatient Hospital Stay: Payer: Medicare HMO | Admitting: Oncology

## 2021-04-13 ENCOUNTER — Other Ambulatory Visit: Payer: Self-pay

## 2021-04-13 ENCOUNTER — Other Ambulatory Visit: Payer: Medicare HMO

## 2021-04-13 ENCOUNTER — Inpatient Hospital Stay: Payer: Medicare HMO

## 2021-04-13 ENCOUNTER — Encounter: Payer: Self-pay | Admitting: *Deleted

## 2021-04-13 ENCOUNTER — Ambulatory Visit: Payer: Medicare HMO | Admitting: Oncology

## 2021-04-13 VITALS — BP 146/86 | HR 84 | Temp 97.8°F | Resp 18 | Ht 65.0 in | Wt 154.0 lb

## 2021-04-13 DIAGNOSIS — Z5111 Encounter for antineoplastic chemotherapy: Secondary | ICD-10-CM | POA: Diagnosis not present

## 2021-04-13 DIAGNOSIS — C251 Malignant neoplasm of body of pancreas: Secondary | ICD-10-CM | POA: Diagnosis not present

## 2021-04-13 DIAGNOSIS — G629 Polyneuropathy, unspecified: Secondary | ICD-10-CM | POA: Diagnosis not present

## 2021-04-13 DIAGNOSIS — Z79899 Other long term (current) drug therapy: Secondary | ICD-10-CM | POA: Diagnosis not present

## 2021-04-13 DIAGNOSIS — C259 Malignant neoplasm of pancreas, unspecified: Secondary | ICD-10-CM

## 2021-04-13 DIAGNOSIS — Z95828 Presence of other vascular implants and grafts: Secondary | ICD-10-CM

## 2021-04-13 DIAGNOSIS — G893 Neoplasm related pain (acute) (chronic): Secondary | ICD-10-CM | POA: Diagnosis not present

## 2021-04-13 DIAGNOSIS — I10 Essential (primary) hypertension: Secondary | ICD-10-CM | POA: Diagnosis not present

## 2021-04-13 DIAGNOSIS — E119 Type 2 diabetes mellitus without complications: Secondary | ICD-10-CM | POA: Diagnosis not present

## 2021-04-13 DIAGNOSIS — C787 Secondary malignant neoplasm of liver and intrahepatic bile duct: Secondary | ICD-10-CM | POA: Diagnosis not present

## 2021-04-13 LAB — CMP (CANCER CENTER ONLY)
ALT: 23 U/L (ref 0–44)
AST: 21 U/L (ref 15–41)
Albumin: 4.1 g/dL (ref 3.5–5.0)
Alkaline Phosphatase: 95 U/L (ref 38–126)
Anion gap: 9 (ref 5–15)
BUN: 18 mg/dL (ref 8–23)
CO2: 22 mmol/L (ref 22–32)
Calcium: 9.3 mg/dL (ref 8.9–10.3)
Chloride: 103 mmol/L (ref 98–111)
Creatinine: 0.74 mg/dL (ref 0.44–1.00)
GFR, Estimated: >60 mL/min (ref >60)
Glucose, Bld: 151 mg/dL — ABNORMAL HIGH (ref 70–99)
Potassium: 4 mmol/L (ref 3.5–5.1)
Sodium: 134 mmol/L — ABNORMAL LOW (ref 135–145)
Total Bilirubin: 0.4 mg/dL (ref 0.3–1.2)
Total Protein: 6.9 g/dL (ref 6.5–8.1)

## 2021-04-13 LAB — CBC WITH DIFFERENTIAL (CANCER CENTER ONLY)
Abs Immature Granulocytes: 0.02 10*3/uL (ref 0.00–0.07)
Basophils Absolute: 0 10*3/uL (ref 0.0–0.1)
Basophils Relative: 1 %
Eosinophils Absolute: 0.1 10*3/uL (ref 0.0–0.5)
Eosinophils Relative: 2 %
HCT: 39.2 % (ref 36.0–46.0)
Hemoglobin: 13 g/dL (ref 12.0–15.0)
Immature Granulocytes: 0 %
Lymphocytes Relative: 34 %
Lymphs Abs: 2.4 10*3/uL (ref 0.7–4.0)
MCH: 32.1 pg (ref 26.0–34.0)
MCHC: 33.2 g/dL (ref 30.0–36.0)
MCV: 96.8 fL (ref 80.0–100.0)
Monocytes Absolute: 0.7 10*3/uL (ref 0.1–1.0)
Monocytes Relative: 10 %
Neutro Abs: 3.8 10*3/uL (ref 1.7–7.7)
Neutrophils Relative %: 53 %
Platelet Count: 101 10*3/uL — ABNORMAL LOW (ref 150–400)
RBC: 4.05 MIL/uL (ref 3.87–5.11)
RDW: 15.1 % (ref 11.5–15.5)
WBC Count: 7.1 10*3/uL (ref 4.0–10.5)
nRBC: 0 % (ref 0.0–0.2)

## 2021-04-13 MED ORDER — HEPARIN SOD (PORK) LOCK FLUSH 100 UNIT/ML IV SOLN
500.0000 [IU] | Freq: Once | INTRAVENOUS | Status: AC
  Administered 2021-04-13: 500 [IU] via INTRAVENOUS

## 2021-04-13 MED ORDER — SODIUM CHLORIDE 0.9% FLUSH
10.0000 mL | Freq: Once | INTRAVENOUS | Status: AC
  Administered 2021-04-13: 10 mL via INTRAVENOUS

## 2021-04-13 NOTE — Progress Notes (Signed)
Patient seen by Dr. Benay Spice today  Vitals are within treatment parameters.  Labs reviewed by Dr. Benay Spice and are within treatment parameters.  Per physician team, patient is ready for treatment. Please note that modifications are being made to the treatment plan including Abraxane has been added back to care plan due to improvement in neuropathy. OK for treatment on 04/16/21.

## 2021-04-13 NOTE — Progress Notes (Signed)
[facility] OFFICE PROGRESS NOTE   Diagnosis: Pancreas cancer  INTERVAL HISTORY:   Ms. Carranza completed another treatment with gemcitabine 04/02/2021.  She feels well.  She reports improvement in numbness in the hands and feet.  No new complaint.  Objective:  Vital signs in last 24 hours:  Blood pressure (!) 146/86, pulse 84, temperature 97.8 F (36.6 C), temperature source Oral, resp. rate 18, height 5\' 5"  (1.651 m), weight 154 lb (69.9 kg), SpO2 100 %.    HEENT: No thrush or ulcers Resp: Lungs clear bilaterally Cardio: Regular rate and rhythm GI: No hepatosplenomegaly, no mass, nontender Vascular: No leg edema   Portacath/PICC-without erythema  Lab Results:  Lab Results  Component Value Date   WBC 7.1 04/13/2021   HGB 13.0 04/13/2021   HCT 39.2 04/13/2021   MCV 96.8 04/13/2021   PLT 101 (L) 04/13/2021   NEUTROABS 3.8 04/13/2021    CMP  Lab Results  Component Value Date   NA 134 (L) 04/13/2021   K 4.0 04/13/2021   CL 103 04/13/2021   CO2 22 04/13/2021   GLUCOSE 151 (H) 04/13/2021   BUN 18 04/13/2021   CREATININE 0.74 04/13/2021   CALCIUM 9.3 04/13/2021   PROT 6.9 04/13/2021   ALBUMIN 4.1 04/13/2021   AST 21 04/13/2021   ALT 23 04/13/2021   ALKPHOS 95 04/13/2021   BILITOT 0.4 04/13/2021   GFRNONAA >60 04/13/2021   GFRAA 71 02/02/2019    Lab Results  Component Value Date   [id number] 1,581 (H) 03/30/2021      Medications: I have reviewed the patient's current medications.   Assessment/Plan: Adenocarcinoma the pancreas body, stage IV (cT4,cN0,pM1) Ultrasound abdomen 08/18/2020-possible hypoechoic pancreas body mass, small hypoechoic liver areas MRI abdomen 08/27/2020-pancreas body mass, multiple hepatic lesions consistent with metastases, right abdominal omental nodularity, pancreas mass extends to the celiac bifurcation and abuts the splenic vein and splenoportal confluence CT chest 09/07/2020-scattered pulmonary nodules concerning for  metastases, pancreas body mass, hepatic metastases Ultrasound-guided biopsy of a right liver lesion 09/08/2020-adenocarcinoma consistent with a pancreas primary CT abdomen/pelvis 09/24/2020-pancreas neck mass, omental and peritoneal nodularity-unchanged, multiple hypoenhancing liver masses-unchanged, numerous small pulmonary nodules Cycle 1 gemcitabine/Abraxane 10/04/2020 Cycle 2 gemcitabine/Abraxane 10/27/2020 Cycle 3 gemcitabine/Abraxane 11/11/2018 Cycle 4 gemcitabine/Abraxane 11/23/2020 Cycle 5 gemcitabine/Abraxane 12/08/2020 CT abdomen/pelvis 12/18/2020-stable pancreas mass, stable and improved liver lesions, resolution of omental soft tissue density, stable indeterminate lung nodules, no disease progression Cycle 6 gemcitabine/Abraxane 12/22/2020 Cycle 7 gemcitabine 01/05/2021, 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  Cycle 8 gemcitabine/Abraxane 01/19/2021 Cycle 9 gemcitabine/Abraxane 02/01/2021 Cycle 10 Gemcitabine 02/16/2021, Abraxane held due to increased neuropathy Cycle 11 Gemcitabine 03/05/2021, 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 Cycle 12 gemcitabine 03/14/2021, 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 Cycle 13 gemcitabine 04/02/2021, 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 Cycle 14 gemcitabine/Abraxane 04/16/2021, Abraxane resumed at a reduced dose   2.  Pain secondary #1 3.  Diabetes 4.  Hypertension 5.  Osteoarthritis 6.  Port-A-Cath placement 09/22/2020 7.  Admission with jaundice 09/24/2020.  MRI-new abrupt constriction of the common hepatic duct.  The infiltrative pancreatic mass extends medially in the porta hepatis to obstruct the common hepatic duct.  Multiple right hepatic lobe metastases mildly increased in size.  Stent placed into the common bile duct 09/28/2020.        Disposition: Ms. Erskin appears unchanged.  The CA 19-9 has been slightly higher over the past month.  We will follow-up on the CA 19-9 from today.  She has been off of Abraxane for the past  2 months.  Neuropathy symptoms have improved.   She will resume treatment with Abraxane at a dose reduction.  Ms. Girdner is scheduled for treatment with gemcitabine/Abraxane 04/16/2021.  She will return for an office and lab visit 04/27/2021.  She will be scheduled for a restaging CT on 05/08/2021.   Betsy Coder, MD  04/13/2021  12:21 PM

## 2021-04-13 NOTE — Patient Instructions (Signed)

## 2021-04-16 ENCOUNTER — Other Ambulatory Visit: Payer: Self-pay

## 2021-04-16 ENCOUNTER — Ambulatory Visit: Payer: Medicare HMO | Admitting: Dermatology

## 2021-04-16 ENCOUNTER — Inpatient Hospital Stay: Payer: Medicare HMO

## 2021-04-16 ENCOUNTER — Ambulatory Visit: Payer: Medicare HMO

## 2021-04-16 ENCOUNTER — Other Ambulatory Visit: Payer: Medicare HMO

## 2021-04-16 VITALS — BP 165/85 | HR 81 | Temp 99.2°F | Resp 18

## 2021-04-16 DIAGNOSIS — C787 Secondary malignant neoplasm of liver and intrahepatic bile duct: Secondary | ICD-10-CM | POA: Diagnosis not present

## 2021-04-16 DIAGNOSIS — C259 Malignant neoplasm of pancreas, unspecified: Secondary | ICD-10-CM

## 2021-04-16 DIAGNOSIS — G893 Neoplasm related pain (acute) (chronic): Secondary | ICD-10-CM | POA: Diagnosis not present

## 2021-04-16 DIAGNOSIS — G629 Polyneuropathy, unspecified: Secondary | ICD-10-CM | POA: Diagnosis not present

## 2021-04-16 DIAGNOSIS — C251 Malignant neoplasm of body of pancreas: Secondary | ICD-10-CM | POA: Diagnosis not present

## 2021-04-16 DIAGNOSIS — I10 Essential (primary) hypertension: Secondary | ICD-10-CM | POA: Diagnosis not present

## 2021-04-16 DIAGNOSIS — Z5111 Encounter for antineoplastic chemotherapy: Secondary | ICD-10-CM | POA: Diagnosis not present

## 2021-04-16 DIAGNOSIS — Z79899 Other long term (current) drug therapy: Secondary | ICD-10-CM | POA: Diagnosis not present

## 2021-04-16 DIAGNOSIS — E119 Type 2 diabetes mellitus without complications: Secondary | ICD-10-CM | POA: Diagnosis not present

## 2021-04-16 MED ORDER — SODIUM CHLORIDE 0.9 % IV SOLN
800.0000 mg/m2 | Freq: Once | INTRAVENOUS | Status: AC
  Administered 2021-04-16: 1444 mg via INTRAVENOUS
  Filled 2021-04-16: qty 26.3

## 2021-04-16 MED ORDER — PROCHLORPERAZINE MALEATE 10 MG PO TABS
10.0000 mg | ORAL_TABLET | Freq: Once | ORAL | Status: AC
  Administered 2021-04-16: 10 mg via ORAL
  Filled 2021-04-16: qty 1

## 2021-04-16 MED ORDER — SODIUM CHLORIDE 0.9 % IV SOLN: INTRAVENOUS | Status: AC

## 2021-04-16 MED ORDER — SODIUM CHLORIDE 0.9% FLUSH
10.0000 mL | INTRAVENOUS | Status: DC | PRN
  Administered 2021-04-16: 10 mL

## 2021-04-16 MED ORDER — SODIUM CHLORIDE 0.9 % IV SOLN: Freq: Once | INTRAVENOUS | Status: AC

## 2021-04-16 MED ORDER — PACLITAXEL PROTEIN-BOUND CHEMO INJECTION 100 MG
50.0000 mg/m2 | Freq: Once | INTRAVENOUS | Status: AC
  Administered 2021-04-16: 100 mg via INTRAVENOUS
  Filled 2021-04-16: qty 20

## 2021-04-16 MED ORDER — HEPARIN SOD (PORK) LOCK FLUSH 100 UNIT/ML IV SOLN
500.0000 [IU] | Freq: Once | INTRAVENOUS | Status: AC | PRN
  Administered 2021-04-16: 500 [IU]

## 2021-04-16 NOTE — Progress Notes (Signed)
Patient presents for treatment. RN assessment completed along with the following:  Labs/vitals reviewed - Yes, and within treatment parameters.   Weight within 10% of previous measurement - Yes Informed consent completed and reflects current therapy/intent - Yes, on date 10/12/20             Provider progress note reviewed - Patient not seen by provider today. Most recent note dated 04/13/21 reviewed. Treatment/Antibody/Supportive plan reviewed - Yes, and reduction in abraxane S&H and other orders reviewed - Yes, and there are no additional orders identified. Previous treatment date reviewed - Yes, and the appropriate amount of time has elapsed between treatments. Clinic Hand Off Received from - Cristy Friedlander, RN  Patient to proceed with treatment.

## 2021-04-16 NOTE — Telephone Encounter (Signed)
Rx send on 04/10/2021

## 2021-04-16 NOTE — Patient Instructions (Addendum)
[facility]   Discharge Instructions: Thank you for choosing [facility] to provide your oncology and hematology care.   If you have a lab appointment with the [facility], please go directly to the [facility] and check in at the registration area.   Wear comfortable clothing and clothing appropriate for easy access to any Portacath or PICC line.   We strive to give you quality time with your provider. You may need to reschedule your appointment if you arrive late (15 or more minutes).  Arriving late affects you and other patients whose appointments are after yours.  Also, if you miss three or more appointments without notifying the office, you may be dismissed from the clinic at the providers discretion.      For prescription refill requests, have your pharmacy contact our office and allow 72 hours for refills to be completed.    Today you received the following chemotherapy and/or immunotherapy agents Abraxane, Gemzar      To help prevent nausea and vomiting after your treatment, we encourage you to take your nausea medication as directed.  BELOW ARE SYMPTOMS THAT SHOULD BE REPORTED IMMEDIATELY: *FEVER GREATER THAN 100.4 F (38 C) OR HIGHER *CHILLS OR SWEATING *NAUSEA AND VOMITING THAT IS NOT CONTROLLED WITH YOUR NAUSEA MEDICATION *UNUSUAL SHORTNESS OF BREATH *UNUSUAL BRUISING OR BLEEDING *URINARY PROBLEMS (pain or burning when urinating, or frequent urination) *BOWEL PROBLEMS (unusual diarrhea, constipation, pain near the anus) TENDERNESS IN MOUTH AND THROAT WITH OR WITHOUT PRESENCE OF ULCERS (sore throat, sores in mouth, or a toothache) UNUSUAL RASH, SWELLING OR PAIN  UNUSUAL VAGINAL DISCHARGE OR ITCHING   Items with * indicate a potential emergency and should be followed up as soon as possible or go to the Emergency Department if any problems should occur.  Please show the CHEMOTHERAPY ALERT CARD or IMMUNOTHERAPY ALERT CARD at  check-in to the Emergency Department and triage nurse.  Should you have questions after your visit or need to cancel or reschedule your appointment, please contact [facility]  Dept: [phone]  and follow the prompts.  Office hours are 8:00 a.m. to 4:30 p.m. Monday - Friday. Please note that voicemails left after 4:00 p.m. may not be returned until the following business day.  We are closed weekends and major holidays. You have access to a nurse at all times for urgent questions. Please call the main number to the clinic Dept: [phone] and follow the prompts.   For any non-urgent questions, you may also contact your provider using MyChart. We now offer e-Visits for anyone 60 and older to request care online for non-urgent symptoms. For details visit [email or web address].   Also download the MyChart app! Go to the app store, search "MyChart", open the app, select [facility], and log in with your MyChart username and password.  Due to Covid, a mask is required upon entering the hospital/clinic. If you do not have a mask, one will be given to you upon arrival. For doctor visits, patients may have 1 support person aged 64 or older with them. For treatment visits, patients cannot have anyone with them due to current Covid guidelines and our immunocompromised population.  Nanoparticle Albumin-Bound Paclitaxel injection What is this medication? NANOPARTICLE ALBUMIN-BOUND PACLITAXEL (Na no PAHR ti kuhl  al BYOO muhn-bound  PAK li TAX el) is a chemotherapy drug. It targets fast dividing cells, like cancer cells, and causes these cells to die. This medicine is used to  treat advanced breast cancer, lung cancer, and pancreatic cancer. This medicine may be used for other purposes; ask your health care provider or pharmacist if you have questions. COMMON BRAND NAME(S): Abraxane What should I tell my care team before I take this medication? They need to know if you have any of  these conditions: kidney disease liver disease low blood counts, like low white cell, platelet, or red cell counts lung or breathing disease, like asthma tingling of the fingers or toes, or other nerve disorder an unusual or allergic reaction to paclitaxel, albumin, other chemotherapy, other medicines, foods, dyes, or preservatives pregnant or trying to get pregnant breast-feeding How should I use this medication? This drug is given as an infusion into a vein. It is administered in a hospital or clinic by a specially trained health care professional. Talk to your pediatrician regarding the use of this medicine in children. Special care may be needed. Overdosage: If you think you have taken too much of this medicine contact a poison control center or emergency room at once. NOTE: This medicine is only for you. Do not share this medicine with others. What if I miss a dose? It is important not to miss your dose. Call your doctor or health care professional if you are unable to keep an appointment. What may interact with this medication? This medicine may interact with the following medications: antiviral medicines for hepatitis, HIV or AIDS certain antibiotics like erythromycin and clarithromycin certain medicines for fungal infections like ketoconazole and itraconazole certain medicines for seizures like carbamazepine, phenobarbital, phenytoin gemfibrozil nefazodone rifampin St. John's wort This list may not describe all possible interactions. Give your health care provider a list of all the medicines, herbs, non-prescription drugs, or dietary supplements you use. Also tell them if you smoke, drink alcohol, or use illegal drugs. Some items may interact with your medicine. What should I watch for while using this medication? Your condition will be monitored carefully while you are receiving this medicine. You will need important blood work done while you are taking this medicine. This  medicine can cause serious allergic reactions. If you experience allergic reactions like skin rash, itching or hives, swelling of the face, lips, or tongue, tell your doctor or health care professional right away. In some cases, you may be given additional medicines to help with side effects. Follow all directions for their use. This drug may make you feel generally unwell. This is not uncommon, as chemotherapy can affect healthy cells as well as cancer cells. Report any side effects. Continue your course of treatment even though you feel ill unless your doctor tells you to stop. Call your doctor or health care professional for advice if you get a fever, chills or sore throat, or other symptoms of a cold or flu. Do not treat yourself. This drug decreases your body's ability to fight infections. Try to avoid being around people who are sick. This medicine may increase your risk to bruise or bleed. Call your doctor or health care professional if you notice any unusual bleeding. Be careful brushing and flossing your teeth or using a toothpick because you may get an infection or bleed more easily. If you have any dental work done, tell your dentist you are receiving this medicine. Avoid taking products that contain aspirin, acetaminophen, ibuprofen, naproxen, or ketoprofen unless instructed by your doctor. These medicines may hide a fever. Do not become pregnant while taking this medicine or for 6 months after stopping it. Women should inform  their doctor if they wish to become pregnant or think they might be pregnant. Men should not father a child while taking this medicine or for 3 months after stopping it. There is a potential for serious side effects to an unborn child. Talk to your health care professional or pharmacist for more information. Do not breast-feed an infant while taking this medicine or for 2 weeks after stopping it. This medicine may interfere with the ability to get pregnant or to father a  child. You should talk to your doctor or health care professional if you are concerned about your fertility. What side effects may I notice from receiving this medication? Side effects that you should report to your doctor or health care professional as soon as possible: allergic reactions like skin rash, itching or hives, swelling of the face, lips, or tongue breathing problems changes in vision fast, irregular heartbeat low blood pressure mouth sores pain, tingling, numbness in the hands or feet signs of decreased platelets or bleeding - bruising, pinpoint red spots on the skin, black, tarry stools, blood in the urine signs of decreased red blood cells - unusually weak or tired, feeling faint or lightheaded, falls signs of infection - fever or chills, cough, sore throat, pain or difficulty passing urine signs and symptoms of liver injury like dark yellow or brown urine; general ill feeling or flu-like symptoms; light-colored stools; loss of appetite; nausea; right upper belly pain; unusually weak or tired; yellowing of the eyes or skin swelling of the ankles, feet, hands unusually slow heartbeat Side effects that usually do not require medical attention (report to your doctor or health care professional if they continue or are bothersome): diarrhea hair loss loss of appetite nausea, vomiting tiredness This list may not describe all possible side effects. Call your doctor for medical advice about side effects. You may report side effects to FDA at 1-800-FDA-1088. Where should I keep my medication? This drug is given in a hospital or clinic and will not be stored at home. NOTE: This sheet is a summary. It may not cover all possible information. If you have questions about this medicine, talk to your doctor, pharmacist, or health care provider.  2022 Elsevier/Gold Standard (2016-11-05 00:00:00)  Gemcitabine injection What is this medication? GEMCITABINE (jem SYE ta been) is a  chemotherapy drug. This medicine is used to treat many types of cancer like breast cancer, lung cancer, pancreatic cancer, and ovarian cancer. This medicine may be used for other purposes; ask your health care provider or pharmacist if you have questions. COMMON BRAND NAME(S): Gemzar, Infugem What should I tell my care team before I take this medication? They need to know if you have any of these conditions: blood disorders infection kidney disease liver disease lung or breathing disease, like asthma recent or ongoing radiation therapy an unusual or allergic reaction to gemcitabine, other chemotherapy, other medicines, foods, dyes, or preservatives pregnant or trying to get pregnant breast-feeding How should I use this medication? This drug is given as an infusion into a vein. It is administered in a hospital or clinic by a specially trained health care professional. Talk to your pediatrician regarding the use of this medicine in children. Special care may be needed. Overdosage: If you think you have taken too much of this medicine contact a poison control center or emergency room at once. NOTE: This medicine is only for you. Do not share this medicine with others. What if I miss a dose? It is important not to  miss your dose. Call your doctor or health care professional if you are unable to keep an appointment. What may interact with this medication? medicines to increase blood counts like filgrastim, pegfilgrastim, sargramostim some other chemotherapy drugs like cisplatin vaccines Talk to your doctor or health care professional before taking any of these medicines: acetaminophen aspirin ibuprofen ketoprofen naproxen This list may not describe all possible interactions. Give your health care provider a list of all the medicines, herbs, non-prescription drugs, or dietary supplements you use. Also tell them if you smoke, drink alcohol, or use illegal drugs. Some items may interact with  your medicine. What should I watch for while using this medication? Visit your doctor for checks on your progress. This drug may make you feel generally unwell. This is not uncommon, as chemotherapy can affect healthy cells as well as cancer cells. Report any side effects. Continue your course of treatment even though you feel ill unless your doctor tells you to stop. In some cases, you may be given additional medicines to help with side effects. Follow all directions for their use. Call your doctor or health care professional for advice if you get a fever, chills or sore throat, or other symptoms of a cold or flu. Do not treat yourself. This drug decreases your body's ability to fight infections. Try to avoid being around people who are sick. This medicine may increase your risk to bruise or bleed. Call your doctor or health care professional if you notice any unusual bleeding. Be careful brushing and flossing your teeth or using a toothpick because you may get an infection or bleed more easily. If you have any dental work done, tell your dentist you are receiving this medicine. Avoid taking products that contain aspirin, acetaminophen, ibuprofen, naproxen, or ketoprofen unless instructed by your doctor. These medicines may hide a fever. Do not become pregnant while taking this medicine or for 6 months after stopping it. Women should inform their doctor if they wish to become pregnant or think they might be pregnant. Men should not father a child while taking this medicine and for 3 months after stopping it. There is a potential for serious side effects to an unborn child. Talk to your health care professional or pharmacist for more information. Do not breast-feed an infant while taking this medicine or for at least 1 week after stopping it. Men should inform their doctors if they wish to father a child. This medicine may lower sperm counts. Talk with your doctor or health care professional if you are  concerned about your fertility. What side effects may I notice from receiving this medication? Side effects that you should report to your doctor or health care professional as soon as possible: allergic reactions like skin rash, itching or hives, swelling of the face, lips, or tongue breathing problems pain, redness, or irritation at site where injected signs and symptoms of a dangerous change in heartbeat or heart rhythm like chest pain; dizziness; fast or irregular heartbeat; palpitations; feeling faint or lightheaded, falls; breathing problems signs of decreased platelets or bleeding - bruising, pinpoint red spots on the skin, black, tarry stools, blood in the urine signs of decreased red blood cells - unusually weak or tired, feeling faint or lightheaded, falls signs of infection - fever or chills, cough, sore throat, pain or difficulty passing urine signs and symptoms of kidney injury like trouble passing urine or change in the amount of urine signs and symptoms of liver injury like dark yellow or brown  urine; general ill feeling or flu-like symptoms; light-colored stools; loss of appetite; nausea; right upper belly pain; unusually weak or tired; yellowing of the eyes or skin swelling of ankles, feet, hands Side effects that usually do not require medical attention (report to your doctor or health care professional if they continue or are bothersome): constipation diarrhea hair loss loss of appetite nausea rash vomiting This list may not describe all possible side effects. Call your doctor for medical advice about side effects. You may report side effects to FDA at 1-800-FDA-1088. Where should I keep my medication? This drug is given in a hospital or clinic and will not be stored at home. NOTE: This sheet is a summary. It may not cover all possible information. If you have questions about this medicine, talk to your doctor, pharmacist, or health care provider.  2022 Elsevier/Gold  Standard (2017-05-28 00:00:00)

## 2021-04-17 ENCOUNTER — Telehealth: Payer: Self-pay | Admitting: *Deleted

## 2021-04-17 NOTE — Telephone Encounter (Signed)
Called patient to inform her of CT scan appointment on 05/08/21. She prefers nurse write it all down and give to her at her next appointment.

## 2021-04-18 ENCOUNTER — Other Ambulatory Visit: Payer: Self-pay | Admitting: Family Medicine

## 2021-04-18 ENCOUNTER — Ambulatory Visit: Payer: Medicare HMO | Admitting: Family Medicine

## 2021-04-18 ENCOUNTER — Telehealth: Payer: Self-pay | Admitting: Family Medicine

## 2021-04-18 NOTE — Chronic Care Management (AMB) (Signed)
°  Chronic Care Management   Note  04/18/2021 Name: LOLA CZERWONKA MRN: [id number] DOB: 10/07/1945  FAMA MUENCHOW is a 76 y.o. year old female who is a primary care patient of [name], MD. I reached out to Leitha Bleak by phone today in response to a referral sent by Ms. Milas Gain Fadely's PCP, [name], MD.   Ms. Coburn was given information about Chronic Care Management services today including:  CCM service includes personalized support from designated clinical staff supervised by her physician, including individualized plan of care and coordination with other care providers 24/7 contact phone numbers for assistance for urgent and routine care needs. Service will only be billed when office clinical staff spend 20 minutes or more in a month to coordinate care. Only one practitioner may furnish and bill the service in a calendar month. The patient may stop CCM services at any time (effective at the end of the month) by phone call to the office staff.   Patient agreed to services and verbal consent obtained.   Follow up plan:   [place]ana Secretary/administrator

## 2021-04-19 NOTE — Progress Notes (Deleted)
Chronic Care Management Pharmacy Note  04/19/2021 Name:  [name] MRN:  [id number] DOB:  04/29/1945  Summary: ***  Recommendations/Changes made from today's visit: ***  Plan: ***   Subjective: [name] is an 76 y.o. year old female who is a primary patient of [name], MD.  The CCM team was consulted for assistance with disease management and care coordination needs.    {CCMTELEPHONEFACETOFACE:21091510} for {CCMINITIALFOLLOWUPCHOICE:21091511} in response to provider referral for pharmacy case management and/or care coordination services.   Consent to Services:  {CCMCONSENTOPTIONS:25074}  Patient Care Team: [name], MD as PCP - General (Family Medicine) Ladell Pier, MD as Consulting Physician (Oncology) Edythe Clarity, [facility] as Pharmacist (Pharmacist)  Recent office visits: ***  Recent consult visits: [facility] visits: {Hospital DC Yes/No:25215}   Objective:  Lab Results  Component Value Date   CREATININE 0.74 04/13/2021   BUN 18 04/13/2021   GFR 60.15 08/18/2020   GFRNONAA >60 04/13/2021   GFRAA 71 02/02/2019   NA 134 (L) 04/13/2021   K 4.0 04/13/2021   CALCIUM 9.3 04/13/2021   CO2 22 04/13/2021   GLUCOSE 151 (H) 04/13/2021    Lab Results  Component Value Date/Time   HGBA1C 6.1 (A) 03/09/2021 10:04 AM   HGBA1C 5.9 (A) 01/11/2021 09:47 AM   HGBA1C 7.1 (H) 09/25/2020 05:11 AM   HGBA1C 6.4 (H) 05/24/2012 10:57 AM   GFR 60.15 08/18/2020 10:01 AM   GFR 67.12 07/26/2020 10:36 AM   MICROALBUR 0.5 11/08/2019 08:27 AM    Last diabetic Eye exam: No results found for: HMDIABEYEEXA  Last diabetic Foot exam: No results found for: HMDIABFOOTEX   Lab Results  Component Value Date   CHOL 143 01/11/2021   HDL 51.20 01/11/2021   LDLCALC 73 01/11/2021   TRIG 94.0 01/11/2021   CHOLHDL 3 01/11/2021    Hepatic Function Latest Ref Rng & Units 04/13/2021 03/30/2021 03/14/2021  Total Protein 6.5 - 8.1 g/dL 6.9 6.7 6.2(L)   Albumin 3.5 - 5.0 g/dL 4.1 4.1 4.0  AST 15 - 41 U/L '21 21 16  ' ALT 0 - 44 U/L '23 22 17  ' Alk Phosphatase 38 - 126 U/L 95 106 88  Total Bilirubin 0.3 - 1.2 mg/dL 0.4 0.4 0.4  Bilirubin, Direct 0.0 - 0.3 mg/dL - - -    Lab Results  Component Value Date/Time   TSH 0.98 01/11/2021 10:06 AM   TSH 2.19 10/11/2020 12:11 PM   FREET4 1.10 06/01/2016 03:26 PM    CBC Latest Ref Rng & Units 04/13/2021 03/30/2021 03/14/2021  WBC 4.0 - 10.5 K/uL 7.1 10.0 8.4  Hemoglobin 12.0 - 15.0 g/dL 13.0 13.0 12.8  Hematocrit 36.0 - 46.0 % 39.2 39.5 37.9  Platelets 150 - 400 K/uL 101(L) 220 127(L)    No results found for: VD25OH  Clinical ASCVD: {YES/NO:21197} The 10-year ASCVD risk score (Arnett DK, et al., 2019) is: 61.3%   Values used to calculate the score:     Age: 11 years     Sex: Female     Is Non-Hispanic African American: No     Diabetic: Yes     Tobacco smoker: No     Systolic Blood Pressure: 537 mmHg     Is BP treated: Yes     HDL Cholesterol: 51.2 mg/dL     Total Cholesterol: 143 mg/dL    Depression screen [facility] 2/9 04/11/2021 01/11/2021 11/27/2020  Decreased Interest 2 3 0  Down, Depressed, Hopeless 3  3 0  PHQ - 2 Score 5 6 0  Altered sleeping 0 1 -  Tired, decreased energy 3 3 -  Change in appetite 2 1 -  Feeling bad or failure about yourself  3 0 -  Trouble concentrating 3 2 -  Moving slowly or fidgety/restless 0 0 -  Suicidal thoughts 0 0 -  PHQ-9 Score 16 13 -  Difficult doing work/chores Somewhat difficult Somewhat difficult -  Some recent data might be hidden     ***Other: (CHADS2VASc if Afib, MMRC or CAT for COPD, ACT, DEXA)  Social History   Tobacco Use  Smoking Status Former   Packs/day: 1.00   Years: 10.00   Pack years: 10.00   Types: Cigarettes   Quit date: 05/25/1987   Years since quitting: 33.9  Smokeless Tobacco Never   BP Readings from Last 3 Encounters:  04/16/21 (!) 165/85  04/13/21 (!) 146/86  04/11/21 119/80   Pulse Readings from Last 3  Encounters:  04/16/21 81  04/13/21 84  04/11/21 (!) 101   Wt Readings from Last 3 Encounters:  04/13/21 154 lb (69.9 kg)  04/02/21 153 lb (69.4 kg)  03/30/21 153 lb (69.4 kg)   BMI Readings from Last 3 Encounters:  04/13/21 25.63 kg/m  04/11/21 25.46 kg/m  04/02/21 25.46 kg/m    Assessment/Interventions: Review of patient past medical history, allergies, medications, health status, including review of consultants reports, laboratory and other test data, was performed as part of comprehensive evaluation and provision of chronic care management services.   SDOH:  (Social Determinants of Health) assessments and interventions performed: {yes/no:20286}  SDOH Screenings   Alcohol Screen: Not on file  Depression (PHQ2-9): Medium Risk   PHQ-2 Score: 16  Financial Resource Strain: Low Risk    Difficulty of Paying Living Expenses: Not hard at all  Food Insecurity: No Food Insecurity   Worried About Charity fundraiser in the Last Year: Never true   Ran Out of Food in the Last Year: Never true  Housing: Low Risk    Last Housing Risk Score: 0  Physical Activity: Insufficiently Active   Days of Exercise per Week: 2 days   Minutes of Exercise per Session: 50 min  Social Connections: Engineer, building services of Communication with Friends and Family: More than three times a week   Frequency of Social Gatherings with Friends and Family: More than three times a week   Attends Religious Services: 1 to 4 times per year   Active Member of [employer] or Organizations: Yes   Attends Archivist Meetings: 1 to 4 times per year   Marital Status: Married  Stress: No Stress Concern Present   Feeling of Stress : Only a little  Tobacco Use: Medium Risk   Smoking Tobacco Use: Former   Smokeless Tobacco Use: Never   Passive Exposure: Not on file  Transportation Needs: No Transportation Needs   Lack of Transportation (Medical): No   Lack of Transportation (Non-Medical): No    CCM  Care Plan  Allergies  Allergen Reactions   Penicillins Hives and Other (See Comments)    Has patient had a PCN reaction causing immediate rash, facial/tongue/throat swelling, SOB or lightheadedness with hypotension: No Has patient had a PCN reaction causing severe rash involving mucus membranes or skin necrosis: No Has patient had a PCN reaction that required hospitalization No Has patient had a PCN reaction occurring within the last 10 years: No If all of the above answers are "  NO", then may proceed with Cephalosporin use.    Medications Reviewed Today     Reviewed by Ladell Pier, MD (Physician) on 04/13/21 at 1230  Med List Status: <None>   Medication Order Taking? Sig Documenting Provider Last Dose Status Informant  ACCU-CHEK AVIVA PLUS test strip [id number] No As needed [name], MD Taking Active   Accu-Chek Softclix Lancets lancets [id number] No As needed [name], MD Taking Active   ALPRAZolam Duanne Moron) 0.5 MG tablet [id number] No Take 1 tablet (0.5 mg total) by mouth daily as needed for anxiety. Owens Shark, NP Taking Active   amLODipine (NORVASC) 5 MG tablet [id number] No Take 5 mg by mouth daily. [provider] Taking Active   aspirin EC 81 MG tablet [id number] No Take 81 mg by mouth every morning. [provider] Taking Active Multiple Informants  Blood Glucose Monitoring Suppl (ACCU-CHEK GUIDE) w/Device KIT [id number] No As needed [name], MD Taking Active   cyanocobalamin (,VITAMIN B-12,) 1000 MCG/ML injection [id number] No Inject 1 mL (1,000 mcg total) into the muscle every 30 (thirty) days. [name], MD Taking Active   docusate sodium (COLACE) 100 MG capsule [id number] No Take 100 mg by mouth 2 (two) times daily as needed (constipation). [provider] Taking Active Multiple Informants           Med Note Ivan Anchors Jan 04, 2021  1:31 PM) Takes at least 1 every day  levothyroxine (SYNTHROID) 125 MCG tablet  [id number] No TAKE 1 TABLET ON AN EMPTY STOMACH IN THE MORNING  Patient taking differently: Take 125 mcg by mouth daily before breakfast.   [name], MD Taking Active   lidocaine-prilocaine (EMLA) cream [id number] No APPLY 1 APPLICATION TOPICALLY AS NEEDED. Ladell Pier, MD Taking Active   omeprazole (PRILOSEC) 20 MG capsule [id number] No TAKE 1 CAPSULE BY MOUTH EVERY DAY [name], MD Taking Active   ondansetron (ZOFRAN) 8 MG tablet [id number] No Take 1 tablet (8 mg total) by mouth 2 (two) times daily as needed (Nausea or vomiting).  Patient not taking: Reported on 04/11/2021   Truitt Merle, MD Not Taking Active            Med Note Payton Doughty   Sat Oct 14, 2020  4:15 PM)    prochlorperazine (COMPAZINE) 10 MG tablet [id number] No Take 1 tablet (10 mg total) by mouth every 6 (six) hours as needed (Nausea or vomiting). 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 site ([place]) 09/12/2020   Vitamin B12 deficiency 10/20/2019   GAD (generalized anxiety disorder) 10/20/2019   Major depression, recurrent, chronic ([place]) 10/20/2019   Diet-controlled diabetes mellitus ([place]) 10/20/2019   Osteoarthritis, multiple sites 10/20/2019   Presbycusis of both ears 04/13/2019   Essential tremor 04/13/2019   Bilateral primary osteoarthritis of knee 09/23/2017   Acquired hypothyroidism 05/24/2012   Alcohol abuse, daily use 05/24/2012   Essential hypertension    Mixed hyperlipidemia     Immunization History  Administered Date(s) Administered   Fluad Quad(high Dose 65+) 10/30/2018, 12/08/2019, 01/27/2021   Influenza, High  Dose Seasonal PF 12/13/2013, 10/30/2018   Influenza-Unspecified 12/16/2013   Moderna SARS-COV2 Booster Vaccination  01/19/2020, 06/20/2020   Moderna Sars-Covid-2 Vaccination 03/30/2019, 04/30/2019   Pneumococcal Conjugate-13 03/09/2014   Pneumococcal Polysaccharide-23 06/24/2011   Td 12/20/2013   Tdap 02/12/2006   Zoster Recombinat (Shingrix) 07/27/2017   Zoster, Live 02/12/2006    Conditions to be addressed/monitored:  HTN, Pancreatic Cancer, Hypothyroidism, HLD, GAD, Depression  There are no care plans that you recently modified to display for this patient.    Medication Assistance: {MEDASSISTANCEINFO:25044}  Compliance/Adherence/Medication fill history: Care Gaps: ***  Star-Rating Drugs: ***  Patient's preferred pharmacy is:  [id number] [place], [place] - [address] [place] [address]Phone: [phone]: [phone] Uses pill box? {Yes or If no, why not?:20788} Pt endorses ***% compliance  We discussed: {Pharmacy options:24294} Patient decided to: {US Pharmacy Plan:23885}  Care Plan and Follow Up Patient Decision:  {FOLLOWUP:24991}  Plan: {CM FOLLOW UP PCVEL:38101} ***  Current Barriers:  {pharmacybarriers:24917}  Pharmacist Clinical Goal(s):  Patient will {PHARMACYGOALCHOICES:24921} through collaboration with PharmD and provider.   Interventions: 1:1 collaboration with ALeamon Arnt MD regarding development and update of comprehensive plan of care as evidenced by provider attestation and co-signature Inter-disciplinary care team collaboration (see longitudinal plan of care) Comprehensive medication review performed; medication list updated in electronic medical record  Hypertension (BP goal {CHL HP UPSTREAM Pharmacist BP ranges:[phone]}) -{US controlled/uncontrolled:25276} -Current treatment: *** -Medications previously tried: ***  -Current home readings: *** -Current dietary habits: *** -Current exercise habits: *** -{ACTIONS;DENIES/REPORTS:21021675::"Denies"}  hypotensive/hypertensive symptoms -Educated on {CCM BP Counseling:25124} -Counseled to monitor BP at home ***, document, and provide log at future appointments -{CCMPHARMDINTERVENTION:25122}  Hyperlipidemia: (LDL goal < ***) -{US controlled/uncontrolled:25276} -Current treatment: *** -Medications previously tried: ***  -Current dietary patterns: *** -Current exercise habits: *** -Educated on {CCM HLD Counseling:25126} -{CCMPHARMDINTERVENTION:25122}  Depression/Anxiety (Goal: ***) -{US controlled/uncontrolled:25276} -Current treatment: *** -Medications previously tried/failed: *** -PHQ9: *** -GAD7: *** -Connected with *** for mental health support -Educated on {CCM mental health counseling:25127} -{CCMPHARMDINTERVENTION:25122}  Pancreatic Cancer (Goal: ***) -{US controlled/uncontrolled:25276} -Current treatment  *** -Medications previously tried: ***  -{CCMPHARMDINTERVENTION:25122}  Hypothyroidism (Goal: ***) -{US controlled/uncontrolled:25276} -Current treatment  *** -Medications previously tried: ***  -{CCMPHARMDINTERVENTION:25122}  Patient Goals/Self-Care Activities Patient will:  - {pharmacypatientgoals:24919}  Follow Up Plan: {CM FOLLOW UP PBPZW:25852}

## 2021-04-23 ENCOUNTER — Other Ambulatory Visit: Payer: Self-pay | Admitting: *Deleted

## 2021-04-23 DIAGNOSIS — E119 Type 2 diabetes mellitus without complications: Secondary | ICD-10-CM

## 2021-04-23 MED ORDER — ACCU-CHEK GUIDE W/DEVICE KIT: PACK | 0 refills | Status: DC

## 2021-04-23 MED ORDER — ACCU-CHEK AVIVA PLUS VI STRP: ORAL_STRIP | 3 refills | Status: DC

## 2021-04-23 MED ORDER — ACCU-CHEK SOFTCLIX LANCETS MISC: 3 refills | Status: DC

## 2021-04-24 ENCOUNTER — Telehealth: Payer: Medicare HMO

## 2021-04-27 ENCOUNTER — Other Ambulatory Visit: Payer: Self-pay

## 2021-04-27 ENCOUNTER — Encounter: Payer: Self-pay | Admitting: *Deleted

## 2021-04-27 ENCOUNTER — Inpatient Hospital Stay: Payer: Medicare HMO

## 2021-04-27 ENCOUNTER — Inpatient Hospital Stay: Payer: Medicare HMO | Attending: Hematology | Admitting: Oncology

## 2021-04-27 VITALS — BP 139/82 | HR 85 | Temp 98.1°F | Resp 19 | Ht 65.0 in | Wt 154.0 lb

## 2021-04-27 DIAGNOSIS — G629 Polyneuropathy, unspecified: Secondary | ICD-10-CM | POA: Insufficient documentation

## 2021-04-27 DIAGNOSIS — G893 Neoplasm related pain (acute) (chronic): Secondary | ICD-10-CM | POA: Diagnosis not present

## 2021-04-27 DIAGNOSIS — Z5111 Encounter for antineoplastic chemotherapy: Secondary | ICD-10-CM | POA: Insufficient documentation

## 2021-04-27 DIAGNOSIS — E119 Type 2 diabetes mellitus without complications: Secondary | ICD-10-CM | POA: Diagnosis not present

## 2021-04-27 DIAGNOSIS — C259 Malignant neoplasm of pancreas, unspecified: Secondary | ICD-10-CM

## 2021-04-27 DIAGNOSIS — C251 Malignant neoplasm of body of pancreas: Secondary | ICD-10-CM | POA: Diagnosis not present

## 2021-04-27 DIAGNOSIS — R17 Unspecified jaundice: Secondary | ICD-10-CM | POA: Insufficient documentation

## 2021-04-27 DIAGNOSIS — I10 Essential (primary) hypertension: Secondary | ICD-10-CM | POA: Insufficient documentation

## 2021-04-27 DIAGNOSIS — M1990 Unspecified osteoarthritis, unspecified site: Secondary | ICD-10-CM | POA: Insufficient documentation

## 2021-04-27 DIAGNOSIS — C787 Secondary malignant neoplasm of liver and intrahepatic bile duct: Secondary | ICD-10-CM | POA: Insufficient documentation

## 2021-04-27 LAB — CMP (CANCER CENTER ONLY)
ALT: 20 U/L (ref 0–44)
AST: 20 U/L (ref 15–41)
Albumin: 3.9 g/dL (ref 3.5–5.0)
Alkaline Phosphatase: 92 U/L (ref 38–126)
Anion gap: 9 (ref 5–15)
BUN: 11 mg/dL (ref 8–23)
CO2: 23 mmol/L (ref 22–32)
Calcium: 9 mg/dL (ref 8.9–10.3)
Chloride: 104 mmol/L (ref 98–111)
Creatinine: 0.74 mg/dL (ref 0.44–1.00)
GFR, Estimated: >60 mL/min (ref >60)
Glucose, Bld: 137 mg/dL — ABNORMAL HIGH (ref 70–99)
Potassium: 4 mmol/L (ref 3.5–5.1)
Sodium: 136 mmol/L (ref 135–145)
Total Bilirubin: 0.5 mg/dL (ref 0.3–1.2)
Total Protein: 6.5 g/dL (ref 6.5–8.1)

## 2021-04-27 LAB — CBC WITH DIFFERENTIAL (CANCER CENTER ONLY)
Abs Immature Granulocytes: 0.02 10*3/uL (ref 0.00–0.07)
Basophils Absolute: 0 10*3/uL (ref 0.0–0.1)
Basophils Relative: 1 %
Eosinophils Absolute: 0.1 10*3/uL (ref 0.0–0.5)
Eosinophils Relative: 2 %
HCT: 37.8 % (ref 36.0–46.0)
Hemoglobin: 12.4 g/dL (ref 12.0–15.0)
Immature Granulocytes: 0 %
Lymphocytes Relative: 38 %
Lymphs Abs: 2.1 10*3/uL (ref 0.7–4.0)
MCH: 32.3 pg (ref 26.0–34.0)
MCHC: 32.8 g/dL (ref 30.0–36.0)
MCV: 98.4 fL (ref 80.0–100.0)
Monocytes Absolute: 0.5 10*3/uL (ref 0.1–1.0)
Monocytes Relative: 9 %
Neutro Abs: 2.8 10*3/uL (ref 1.7–7.7)
Neutrophils Relative %: 50 %
Platelet Count: 116 10*3/uL — ABNORMAL LOW (ref 150–400)
RBC: 3.84 MIL/uL — ABNORMAL LOW (ref 3.87–5.11)
RDW: 15.2 % (ref 11.5–15.5)
WBC Count: 5.5 10*3/uL (ref 4.0–10.5)
nRBC: 0 % (ref 0.0–0.2)

## 2021-04-27 NOTE — Progress Notes (Signed)
Patient seen by Dr. Benay Spice today  Vitals are within treatment parameters.  Labs reviewed by Dr. Benay Spice and are within treatment parameters.  Per physician team, patient is ready for treatment and there are NO modifications to the treatment plan.  Per Dr. Stevphen Meuse to tx on 04/30/21

## 2021-04-27 NOTE — Progress Notes (Signed)
[facility] OFFICE PROGRESS NOTE   Diagnosis: Pancreas cancer  INTERVAL HISTORY:   [name] completed another cycle of gemcitabine and Abraxane on 04/16/2021.  She reports increased nausea following this cycle of chemotherapy.  The nausea was relieved with antiemetics.  No emesis.  Neuropathy symptoms have improved.  No pain.  Objective:  Vital signs in last 24 hours:  Blood pressure 139/82, pulse 85, temperature 98.1 F (36.7 C), temperature source Oral, resp. rate 19, height 5\' 5"  (1.651 m), weight 154 lb (69.9 kg), SpO2 100 %.    HEENT: No thrush or ulcers Resp: Lungs clear bilaterally Cardio: Regular rate and rhythm GI: No hepatosplenomegaly, nontender, no apparent ascites Vascular: No leg edema    Portacath/PICC-without erythema  Lab Results:  Lab Results  Component Value Date   WBC 5.5 04/27/2021   HGB 12.4 04/27/2021   HCT 37.8 04/27/2021   MCV 98.4 04/27/2021   PLT 116 (L) 04/27/2021   NEUTROABS 2.8 04/27/2021    CMP  Lab Results  Component Value Date   NA 136 04/27/2021   K 4.0 04/27/2021   CL 104 04/27/2021   CO2 23 04/27/2021   GLUCOSE 137 (H) 04/27/2021   BUN 11 04/27/2021   CREATININE 0.74 04/27/2021   CALCIUM 9.0 04/27/2021   PROT 6.5 04/27/2021   ALBUMIN 3.9 04/27/2021   AST 20 04/27/2021   ALT 20 04/27/2021   ALKPHOS 92 04/27/2021   BILITOT 0.5 04/27/2021   GFRNONAA >60 04/27/2021   GFRAA 71 02/02/2019    Lab Results  Component Value Date   [id number] 1,581 (H) 03/30/2021      Medications: I have reviewed the patient's current medications.   Assessment/Plan: Adenocarcinoma the pancreas body, stage IV (cT4,cN0,pM1) Ultrasound abdomen 08/18/2020-possible hypoechoic pancreas body mass, small hypoechoic liver areas MRI abdomen 08/27/2020-pancreas body mass, multiple hepatic lesions consistent with metastases, right abdominal omental nodularity, pancreas mass extends to the celiac bifurcation and abuts the splenic vein and  splenoportal confluence CT chest 09/07/2020-scattered pulmonary nodules concerning for metastases, pancreas body mass, hepatic metastases Ultrasound-guided biopsy of a right liver lesion 09/08/2020-adenocarcinoma consistent with a pancreas primary CT abdomen/pelvis 09/24/2020-pancreas neck mass, omental and peritoneal nodularity-unchanged, multiple hypoenhancing liver masses-unchanged, numerous small pulmonary nodules Cycle 1 gemcitabine/Abraxane 10/04/2020 Cycle 2 gemcitabine/Abraxane 10/27/2020 Cycle 3 gemcitabine/Abraxane 11/11/2018 Cycle 4 gemcitabine/Abraxane 11/23/2020 Cycle 5 gemcitabine/Abraxane 12/08/2020 CT abdomen/pelvis 12/18/2020-stable pancreas mass, stable and improved liver lesions, resolution of omental soft tissue density, stable indeterminate lung nodules, no disease progression Cycle 6 gemcitabine/Abraxane 12/22/2020 Cycle 7 gemcitabine 01/05/2021, 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  Cycle 8 gemcitabine/Abraxane 01/19/2021 Cycle 9 gemcitabine/Abraxane 02/01/2021 Cycle 10 Gemcitabine 02/16/2021, Abraxane held due to increased neuropathy Cycle 11 Gemcitabine 03/05/2021, 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 Cycle 12 gemcitabine 03/14/2021, 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 Cycle 13 gemcitabine 04/02/2021, 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 Cycle 14 gemcitabine/Abraxane 04/16/2021, Abraxane resumed at a reduced dose Cycle 15 gemcitabine/Abraxane 04/30/2021   2.  Pain secondary #1 3.  Diabetes 4.  Hypertension 5.  Osteoarthritis 6.  Port-A-Cath placement 09/22/2020 7.  Admission with jaundice 09/24/2020.  MRI-new abrupt constriction of the common hepatic duct.  The infiltrative pancreatic mass extends medially in the porta hepatis to obstruct the common hepatic duct.  Multiple right hepatic lobe metastases mildly increased in size.  Stent placed into the common bile duct 09/28/2020.     Disposition: [name] appears stable.  Neuropathy symptoms did not increase after reintroduction of Abraxane.  She  will complete another cycle of gemcitabine/Abraxane  04/30/2021.  We will follow-up on the CA 19-9 from today.  She will undergo a restaging CT 05/08/2021.  She is scheduled for a follow-up visit 05/11/2021.  Betsy Coder, MD  04/27/2021  12:00 PM

## 2021-04-28 LAB — CANCER ANTIGEN 19-9: CA 19-9: 3026 U/mL — ABNORMAL HIGH (ref 0–35)

## 2021-04-29 ENCOUNTER — Other Ambulatory Visit: Payer: Self-pay | Admitting: Oncology

## 2021-04-30 ENCOUNTER — Other Ambulatory Visit: Payer: Self-pay

## 2021-04-30 ENCOUNTER — Inpatient Hospital Stay: Payer: Medicare HMO

## 2021-04-30 VITALS — BP 143/81 | HR 86 | Temp 98.5°F | Resp 18

## 2021-04-30 DIAGNOSIS — G629 Polyneuropathy, unspecified: Secondary | ICD-10-CM | POA: Diagnosis not present

## 2021-04-30 DIAGNOSIS — R17 Unspecified jaundice: Secondary | ICD-10-CM | POA: Diagnosis not present

## 2021-04-30 DIAGNOSIS — C251 Malignant neoplasm of body of pancreas: Secondary | ICD-10-CM | POA: Diagnosis not present

## 2021-04-30 DIAGNOSIS — C259 Malignant neoplasm of pancreas, unspecified: Secondary | ICD-10-CM

## 2021-04-30 DIAGNOSIS — Z5111 Encounter for antineoplastic chemotherapy: Secondary | ICD-10-CM | POA: Diagnosis not present

## 2021-04-30 DIAGNOSIS — M1990 Unspecified osteoarthritis, unspecified site: Secondary | ICD-10-CM | POA: Diagnosis not present

## 2021-04-30 DIAGNOSIS — C787 Secondary malignant neoplasm of liver and intrahepatic bile duct: Secondary | ICD-10-CM | POA: Diagnosis not present

## 2021-04-30 DIAGNOSIS — E119 Type 2 diabetes mellitus without complications: Secondary | ICD-10-CM | POA: Diagnosis not present

## 2021-04-30 DIAGNOSIS — G893 Neoplasm related pain (acute) (chronic): Secondary | ICD-10-CM | POA: Diagnosis not present

## 2021-04-30 DIAGNOSIS — I10 Essential (primary) hypertension: Secondary | ICD-10-CM | POA: Diagnosis not present

## 2021-04-30 MED ORDER — SODIUM CHLORIDE 0.9 % IV SOLN: Freq: Once | INTRAVENOUS | Status: AC

## 2021-04-30 MED ORDER — SODIUM CHLORIDE 0.9% FLUSH
10.0000 mL | INTRAVENOUS | Status: DC | PRN
  Administered 2021-04-30: 10 mL

## 2021-04-30 MED ORDER — HEPARIN SOD (PORK) LOCK FLUSH 100 UNIT/ML IV SOLN
500.0000 [IU] | Freq: Once | INTRAVENOUS | Status: AC | PRN
  Administered 2021-04-30: 500 [IU]

## 2021-04-30 MED ORDER — PROCHLORPERAZINE MALEATE 10 MG PO TABS
10.0000 mg | ORAL_TABLET | Freq: Once | ORAL | Status: AC
  Administered 2021-04-30: 10 mg via ORAL
  Filled 2021-04-30: qty 1

## 2021-04-30 MED ORDER — SODIUM CHLORIDE 0.9 % IV SOLN
800.0000 mg/m2 | Freq: Once | INTRAVENOUS | Status: AC
  Administered 2021-04-30: 1444 mg via INTRAVENOUS
  Filled 2021-04-30: qty 26.3

## 2021-04-30 MED ORDER — PACLITAXEL PROTEIN-BOUND CHEMO INJECTION 100 MG
50.0000 mg/m2 | Freq: Once | INTRAVENOUS | Status: AC
  Administered 2021-04-30: 100 mg via INTRAVENOUS
  Filled 2021-04-30: qty 20

## 2021-04-30 MED ORDER — SODIUM CHLORIDE 0.9 % IV SOLN: INTRAVENOUS | Status: AC

## 2021-04-30 NOTE — Patient Instructions (Addendum)
[facility]   Discharge Instructions: Thank you for choosing [facility] to provide your oncology and hematology care.   If you have a lab appointment with the [facility], please go directly to the [facility] and check in at the registration area.   Wear comfortable clothing and clothing appropriate for easy access to any Portacath or PICC line.   We strive to give you quality time with your provider. You may need to reschedule your appointment if you arrive late (15 or more minutes).  Arriving late affects you and other patients whose appointments are after yours.  Also, if you miss three or more appointments without notifying the office, you may be dismissed from the clinic at the providers discretion.      For prescription refill requests, have your pharmacy contact our office and allow 72 hours for refills to be completed.    Today you received the following chemotherapy and/or immunotherapy agents Abraxane, Gemzar      To help prevent nausea and vomiting after your treatment, we encourage you to take your nausea medication as directed.  BELOW ARE SYMPTOMS THAT SHOULD BE REPORTED IMMEDIATELY: *FEVER GREATER THAN 100.4 F (38 C) OR HIGHER *CHILLS OR SWEATING *NAUSEA AND VOMITING THAT IS NOT CONTROLLED WITH YOUR NAUSEA MEDICATION *UNUSUAL SHORTNESS OF BREATH *UNUSUAL BRUISING OR BLEEDING *URINARY PROBLEMS (pain or burning when urinating, or frequent urination) *BOWEL PROBLEMS (unusual diarrhea, constipation, pain near the anus) TENDERNESS IN MOUTH AND THROAT WITH OR WITHOUT PRESENCE OF ULCERS (sore throat, sores in mouth, or a toothache) UNUSUAL RASH, SWELLING OR PAIN  UNUSUAL VAGINAL DISCHARGE OR ITCHING   Items with * indicate a potential emergency and should be followed up as soon as possible or go to the Emergency Department if any problems should occur.  Please show the CHEMOTHERAPY ALERT CARD or IMMUNOTHERAPY ALERT CARD at  check-in to the Emergency Department and triage nurse.  Should you have questions after your visit or need to cancel or reschedule your appointment, please contact [facility]  Dept: [phone]  and follow the prompts.  Office hours are 8:00 a.m. to 4:30 p.m. Monday - Friday. Please note that voicemails left after 4:00 p.m. may not be returned until the following business day.  We are closed weekends and major holidays. You have access to a nurse at all times for urgent questions. Please call the main number to the clinic Dept: [phone] and follow the prompts.   For any non-urgent questions, you may also contact your provider using MyChart. We now offer e-Visits for anyone 34 and older to request care online for non-urgent symptoms. For details visit [email or web address].   Also download the MyChart app! Go to the app store, search "MyChart", open the app, select [facility], and log in with your MyChart username and password.  Due to Covid, a mask is required upon entering the hospital/clinic. If you do not have a mask, one will be given to you upon arrival. For doctor visits, patients may have 1 support person aged 8 or older with them. For treatment visits, patients cannot have anyone with them due to current Covid guidelines and our immunocompromised population.   Nanoparticle Albumin-Bound Paclitaxel injection What is this medication? NANOPARTICLE ALBUMIN-BOUND PACLITAXEL (Na no PAHR ti kuhl  al BYOO muhn-bound  PAK li TAX el) is a chemotherapy drug. It targets fast dividing cells, like cancer cells, and causes these cells to die. This medicine is used  to treat advanced breast cancer, lung cancer, and pancreatic cancer. This medicine may be used for other purposes; ask your health care provider or pharmacist if you have questions. COMMON BRAND NAME(S): Abraxane What should I tell my care team before I take this medication? They need to know if you have any  of these conditions: kidney disease liver disease low blood counts, like low white cell, platelet, or red cell counts lung or breathing disease, like asthma tingling of the fingers or toes, or other nerve disorder an unusual or allergic reaction to paclitaxel, albumin, other chemotherapy, other medicines, foods, dyes, or preservatives pregnant or trying to get pregnant breast-feeding How should I use this medication? This drug is given as an infusion into a vein. It is administered in a hospital or clinic by a specially trained health care professional. Talk to your pediatrician regarding the use of this medicine in children. Special care may be needed. Overdosage: If you think you have taken too much of this medicine contact a poison control center or emergency room at once. NOTE: This medicine is only for you. Do not share this medicine with others. What if I miss a dose? It is important not to miss your dose. Call your doctor or health care professional if you are unable to keep an appointment. What may interact with this medication? This medicine may interact with the following medications: antiviral medicines for hepatitis, HIV or AIDS certain antibiotics like erythromycin and clarithromycin certain medicines for fungal infections like ketoconazole and itraconazole certain medicines for seizures like carbamazepine, phenobarbital, phenytoin gemfibrozil nefazodone rifampin St. John's wort This list may not describe all possible interactions. Give your health care provider a list of all the medicines, herbs, non-prescription drugs, or dietary supplements you use. Also tell them if you smoke, drink alcohol, or use illegal drugs. Some items may interact with your medicine. What should I watch for while using this medication? Your condition will be monitored carefully while you are receiving this medicine. You will need important blood work done while you are taking this medicine. This  medicine can cause serious allergic reactions. If you experience allergic reactions like skin rash, itching or hives, swelling of the face, lips, or tongue, tell your doctor or health care professional right away. In some cases, you may be given additional medicines to help with side effects. Follow all directions for their use. This drug may make you feel generally unwell. This is not uncommon, as chemotherapy can affect healthy cells as well as cancer cells. Report any side effects. Continue your course of treatment even though you feel ill unless your doctor tells you to stop. Call your doctor or health care professional for advice if you get a fever, chills or sore throat, or other symptoms of a cold or flu. Do not treat yourself. This drug decreases your body's ability to fight infections. Try to avoid being around people who are sick. This medicine may increase your risk to bruise or bleed. Call your doctor or health care professional if you notice any unusual bleeding. Be careful brushing and flossing your teeth or using a toothpick because you may get an infection or bleed more easily. If you have any dental work done, tell your dentist you are receiving this medicine. Avoid taking products that contain aspirin, acetaminophen, ibuprofen, naproxen, or ketoprofen unless instructed by your doctor. These medicines may hide a fever. Do not become pregnant while taking this medicine or for 6 months after stopping it. Women should  inform their doctor if they wish to become pregnant or think they might be pregnant. Men should not father a child while taking this medicine or for 3 months after stopping it. There is a potential for serious side effects to an unborn child. Talk to your health care professional or pharmacist for more information. Do not breast-feed an infant while taking this medicine or for 2 weeks after stopping it. This medicine may interfere with the ability to get pregnant or to father a  child. You should talk to your doctor or health care professional if you are concerned about your fertility. What side effects may I notice from receiving this medication? Side effects that you should report to your doctor or health care professional as soon as possible: allergic reactions like skin rash, itching or hives, swelling of the face, lips, or tongue breathing problems changes in vision fast, irregular heartbeat low blood pressure mouth sores pain, tingling, numbness in the hands or feet signs of decreased platelets or bleeding - bruising, pinpoint red spots on the skin, black, tarry stools, blood in the urine signs of decreased red blood cells - unusually weak or tired, feeling faint or lightheaded, falls signs of infection - fever or chills, cough, sore throat, pain or difficulty passing urine signs and symptoms of liver injury like dark yellow or brown urine; general ill feeling or flu-like symptoms; light-colored stools; loss of appetite; nausea; right upper belly pain; unusually weak or tired; yellowing of the eyes or skin swelling of the ankles, feet, hands unusually slow heartbeat Side effects that usually do not require medical attention (report to your doctor or health care professional if they continue or are bothersome): diarrhea hair loss loss of appetite nausea, vomiting tiredness This list may not describe all possible side effects. Call your doctor for medical advice about side effects. You may report side effects to FDA at 1-800-FDA-1088. Where should I keep my medication? This drug is given in a hospital or clinic and will not be stored at home. NOTE: This sheet is a summary. It may not cover all possible information. If you have questions about this medicine, talk to your doctor, pharmacist, or health care provider.  2022 Elsevier/Gold Standard (2016-11-05 00:00:00)  Gemcitabine injection What is this medication? GEMCITABINE (jem SYE ta been) is a  chemotherapy drug. This medicine is used to treat many types of cancer like breast cancer, lung cancer, pancreatic cancer, and ovarian cancer. This medicine may be used for other purposes; ask your health care provider or pharmacist if you have questions. COMMON BRAND NAME(S): Gemzar, Infugem What should I tell my care team before I take this medication? They need to know if you have any of these conditions: blood disorders infection kidney disease liver disease lung or breathing disease, like asthma recent or ongoing radiation therapy an unusual or allergic reaction to gemcitabine, other chemotherapy, other medicines, foods, dyes, or preservatives pregnant or trying to get pregnant breast-feeding How should I use this medication? This drug is given as an infusion into a vein. It is administered in a hospital or clinic by a specially trained health care professional. Talk to your pediatrician regarding the use of this medicine in children. Special care may be needed. Overdosage: If you think you have taken too much of this medicine contact a poison control center or emergency room at once. NOTE: This medicine is only for you. Do not share this medicine with others. What if I miss a dose? It is important not  to miss your dose. Call your doctor or health care professional if you are unable to keep an appointment. What may interact with this medication? medicines to increase blood counts like filgrastim, pegfilgrastim, sargramostim some other chemotherapy drugs like cisplatin vaccines Talk to your doctor or health care professional before taking any of these medicines: acetaminophen aspirin ibuprofen ketoprofen naproxen This list may not describe all possible interactions. Give your health care provider a list of all the medicines, herbs, non-prescription drugs, or dietary supplements you use. Also tell them if you smoke, drink alcohol, or use illegal drugs. Some items may interact with  your medicine. What should I watch for while using this medication? Visit your doctor for checks on your progress. This drug may make you feel generally unwell. This is not uncommon, as chemotherapy can affect healthy cells as well as cancer cells. Report any side effects. Continue your course of treatment even though you feel ill unless your doctor tells you to stop. In some cases, you may be given additional medicines to help with side effects. Follow all directions for their use. Call your doctor or health care professional for advice if you get a fever, chills or sore throat, or other symptoms of a cold or flu. Do not treat yourself. This drug decreases your body's ability to fight infections. Try to avoid being around people who are sick. This medicine may increase your risk to bruise or bleed. Call your doctor or health care professional if you notice any unusual bleeding. Be careful brushing and flossing your teeth or using a toothpick because you may get an infection or bleed more easily. If you have any dental work done, tell your dentist you are receiving this medicine. Avoid taking products that contain aspirin, acetaminophen, ibuprofen, naproxen, or ketoprofen unless instructed by your doctor. These medicines may hide a fever. Do not become pregnant while taking this medicine or for 6 months after stopping it. Women should inform their doctor if they wish to become pregnant or think they might be pregnant. Men should not father a child while taking this medicine and for 3 months after stopping it. There is a potential for serious side effects to an unborn child. Talk to your health care professional or pharmacist for more information. Do not breast-feed an infant while taking this medicine or for at least 1 week after stopping it. Men should inform their doctors if they wish to father a child. This medicine may lower sperm counts. Talk with your doctor or health care professional if you are  concerned about your fertility. What side effects may I notice from receiving this medication? Side effects that you should report to your doctor or health care professional as soon as possible: allergic reactions like skin rash, itching or hives, swelling of the face, lips, or tongue breathing problems pain, redness, or irritation at site where injected signs and symptoms of a dangerous change in heartbeat or heart rhythm like chest pain; dizziness; fast or irregular heartbeat; palpitations; feeling faint or lightheaded, falls; breathing problems signs of decreased platelets or bleeding - bruising, pinpoint red spots on the skin, black, tarry stools, blood in the urine signs of decreased red blood cells - unusually weak or tired, feeling faint or lightheaded, falls signs of infection - fever or chills, cough, sore throat, pain or difficulty passing urine signs and symptoms of kidney injury like trouble passing urine or change in the amount of urine signs and symptoms of liver injury like dark yellow or  brown urine; general ill feeling or flu-like symptoms; light-colored stools; loss of appetite; nausea; right upper belly pain; unusually weak or tired; yellowing of the eyes or skin swelling of ankles, feet, hands Side effects that usually do not require medical attention (report to your doctor or health care professional if they continue or are bothersome): constipation diarrhea hair loss loss of appetite nausea rash vomiting This list may not describe all possible side effects. Call your doctor for medical advice about side effects. You may report side effects to FDA at 1-800-FDA-1088. Where should I keep my medication? This drug is given in a hospital or clinic and will not be stored at home. NOTE: This sheet is a summary. It may not cover all possible information. If you have questions about this medicine, talk to your doctor, pharmacist, or health care provider.  2022 Elsevier/Gold  Standard (2017-05-28 00:00:00)  Rehydration, Adult Rehydration is the replacement of body fluids, salts, and minerals (electrolytes) that are lost during dehydration. Dehydration is when there is not enough water or other fluids in the body. This happens when you lose more fluids than you take in. Common causes of dehydration include: Not drinking enough fluids. This can occur when you are ill or doing activities that require a lot of energy, especially in hot weather. Conditions that cause loss of water or other fluids, such as diarrhea, vomiting, sweating, or urinating a lot. Other illnesses, such as fever or infection. Certain medicines, such as those that remove excess fluid from the body (diuretics). Symptoms of mild or moderate dehydration may include thirst, dry lips and mouth, and dizziness. Symptoms of severe dehydration may include increased heart rate, confusion, fainting, and not urinating. For severe dehydration, you may need to get fluids through an IV at the hospital. For mild or moderate dehydration, you can usually rehydrate at home by drinking certain fluids as told by your health care provider. What are the risks? Generally, rehydration is safe. However, taking in too much fluid (overhydration) can be a problem. This is rare. Overhydration can cause an electrolyte imbalance, kidney failure, or a decrease in salt (sodium) levels in the body. Supplies needed You will need an oral rehydration solution (ORS) if your health care provider tells you to use one. This is a drink to treat dehydration. It can be found in pharmacies and retail stores. How to rehydrate Fluids Follow instructions from your health care provider for rehydration. The kind of fluid and the amount you should drink depend on your condition. In general, you should choose drinks that you prefer. If told by your health care provider, drink an ORS. Make an ORS by following instructions on the package. Start by drinking  small amounts, about  cup (120 mL) every 5-10 minutes. Slowly increase how much you drink until you have taken the amount recommended by your health care provider. Drink enough clear fluids to keep your urine pale yellow. If you were told to drink an ORS, finish it first, then start slowly drinking other clear fluids. Drink fluids such as: Water. This includes sparkling water and flavored water. Drinking only water can lead to having too little sodium in your body (hyponatremia). Follow the advice of your health care provider. Water from ice chips you suck on. Fruit juice with water you add to it (diluted). Sports drinks. Hot or cold herbal teas. Broth-based soups. Milk or milk products. Food Follow instructions from your health care provider about what to eat while you rehydrate. Your health care  provider may recommend that you slowly begin eating regular foods in small amounts. Eat foods that contain a healthy balance of electrolytes, such as bananas, oranges, potatoes, tomatoes, and spinach. Avoid foods that are greasy or contain a lot of sugar. In some cases, you may get nutrition through a feeding tube that is passed through your nose and into your stomach (nasogastric tube, or NG tube). This may be done if you have uncontrolled vomiting or diarrhea. Beverages to avoid Certain beverages may make dehydration worse. While you rehydrate, avoid drinking alcohol. How to tell if you are recovering from dehydration You may be recovering from dehydration if: You are urinating more often than before you started rehydrating. Your urine is pale yellow. Your energy level improves. You vomit less frequently. You have diarrhea less frequently. Your appetite improves or returns to normal. You feel less dizzy or less light-headed. Your skin tone and color start to look more normal. Follow these instructions at home: Take over-the-counter and prescription medicines only as told by your health care  provider. Do not take sodium tablets. Doing this can lead to having too much sodium in your body (hypernatremia). Contact a health care provider if: You continue to have symptoms of mild or moderate dehydration, such as: Thirst. Dry lips. Slightly dry mouth. Dizziness. Dark urine or less urine than normal. Muscle cramps. You continue to vomit or have diarrhea. Get help right away if you: Have symptoms of dehydration that get worse. Have a fever. Have a severe headache. Have been vomiting and the following happens: Your vomiting gets worse or does not go away. Your vomit includes blood or green matter (bile). You cannot eat or drink without vomiting. Have problems with urination or bowel movements, such as: Diarrhea that gets worse or does not go away. Blood in your stool (feces). This may cause stool to look black and tarry. Not urinating, or urinating only a small amount of very dark urine, within 6-8 hours. Have trouble breathing. Have symptoms that get worse with treatment. These symptoms may represent a serious problem that is an emergency. Do not wait to see if the symptoms will go away. Get medical help right away. Call your local emergency services (911 in the [place] Do not drive yourself to the hospital. Summary Rehydration is the replacement of body fluids and minerals (electrolytes) that are lost during dehydration. Follow instructions from your health care provider for rehydration. The kind of fluid and amount you should drink depend on your condition. Slowly increase how much you drink until you have taken the amount recommended by your health care provider. Contact your health care provider if you continue to show signs of mild or moderate dehydration. This information is not intended to replace advice given to you by your health care provider. Make sure you discuss any questions you have with your health care provider. Document Revised: 05/05/2019 Document Reviewed:  03/15/2019 Elsevier Patient Education  2022 [employer].

## 2021-04-30 NOTE — Progress Notes (Signed)
Patient presents for treatment. RN assessment completed along with the following:  Labs/vitals reviewed - Yes, and within treatment parameters.   Weight within 10% of previous measurement - Yes Informed consent completed and reflects current therapy/intent - Yes, on date 10/12/20             Provider progress note reviewed - Patient not seen by provider today. Most recent note dated 04/27/21 reviewed. Treatment/Antibody/Supportive plan reviewed - Yes, and there are no adjustments needed for today's treatment. S&H and other orders reviewed - Yes, and there are no additional orders identified. Previous treatment date reviewed - Yes, and the appropriate amount of time has elapsed between treatments. Clinic Hand Off Received from - none   Patient to proceed with treatment.

## 2021-05-01 ENCOUNTER — Telehealth: Payer: Self-pay | Admitting: Family Medicine

## 2021-05-01 NOTE — Telephone Encounter (Signed)
Pt is stating she requested a new kit and the order was sent to [facility] but was supposed to be sent to [facility]. I am seeing that it was order on 2/6 and sent to [facility]. Pt is upset and stated I'm wrong. She states it was ordered on 04/10/21 and was sent to [facility]. She would like to talk to [facility]. Please Advise   Blood Glucose Monitoring Suppl (ACCU-CHEK GUIDE) w/Device KIT 1 kit 0 04/23/2021    Sig: As needed   Sent to pharmacy as: Blood Glucose Monitoring Suppl (ACCU-CHEK GUIDE) w/Device Kit   E-Prescribing Status: Receipt confirmed by pharmacy (04/23/2021 10:39 AM EST)    Pharmacy  [facility], [address]

## 2021-05-01 NOTE — Telephone Encounter (Signed)
Patient aware Rx glucometer was send to [facility] on 04/23/2021

## 2021-05-03 ENCOUNTER — Telehealth: Payer: Self-pay

## 2021-05-03 NOTE — Telephone Encounter (Signed)
Follow up call after [facility] a Nurse call inquiring about Pt having uncontrollable shaking. Spoke with Pt's daughter who stated Pt was suffering from a panic attack and she took her xanax. 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 Also informed Pt's daughter to take Pt's temperature to make sure she doesn't have neutropenia just to be sure. Pt's daughter verbalized understanding.

## 2021-05-08 ENCOUNTER — Ambulatory Visit: Payer: Medicare HMO

## 2021-05-08 ENCOUNTER — Inpatient Hospital Stay: Payer: Medicare HMO

## 2021-05-08 ENCOUNTER — Telehealth: Payer: Self-pay

## 2021-05-08 ENCOUNTER — Other Ambulatory Visit: Payer: Self-pay

## 2021-05-08 ENCOUNTER — Ambulatory Visit (HOSPITAL_BASED_OUTPATIENT_CLINIC_OR_DEPARTMENT_OTHER)
Admission: RE | Admit: 2021-05-08 | Discharge: 2021-05-08 | Disposition: A | Discharge status: Home / Self Care | Payer: Medicare HMO | Source: Ambulatory Visit | Attending: Oncology | Admitting: Oncology

## 2021-05-08 ENCOUNTER — Encounter (HOSPITAL_BASED_OUTPATIENT_CLINIC_OR_DEPARTMENT_OTHER): Payer: Self-pay

## 2021-05-08 DIAGNOSIS — C801 Malignant (primary) neoplasm, unspecified: Secondary | ICD-10-CM | POA: Diagnosis not present

## 2021-05-08 DIAGNOSIS — K5900 Constipation, unspecified: Secondary | ICD-10-CM | POA: Diagnosis not present

## 2021-05-08 DIAGNOSIS — K449 Diaphragmatic hernia without obstruction or gangrene: Secondary | ICD-10-CM | POA: Diagnosis not present

## 2021-05-08 DIAGNOSIS — C787 Secondary malignant neoplasm of liver and intrahepatic bile duct: Secondary | ICD-10-CM | POA: Diagnosis not present

## 2021-05-08 DIAGNOSIS — C259 Malignant neoplasm of pancreas, unspecified: Secondary | ICD-10-CM | POA: Diagnosis not present

## 2021-05-08 DIAGNOSIS — C7889 Secondary malignant neoplasm of other digestive organs: Secondary | ICD-10-CM | POA: Diagnosis not present

## 2021-05-08 MED ORDER — HEPARIN SOD (PORK) LOCK FLUSH 100 UNIT/ML IV SOLN
500.0000 [IU] | Freq: Once | INTRAVENOUS | Status: AC
  Administered 2021-05-08: 500 [IU] via INTRAVENOUS

## 2021-05-08 MED ORDER — IOHEXOL 300 MG/ML  SOLN
85.0000 mL | Freq: Once | INTRAMUSCULAR | Status: AC | PRN
  Administered 2021-05-08: 75 mL via INTRAVENOUS

## 2021-05-08 NOTE — Telephone Encounter (Signed)
Patient has called in along with [facility] stating they did not receive script for test strips on 2/6.  Is requesting to send again as soon as possible.   Please give a call once sent.

## 2021-05-09 ENCOUNTER — Other Ambulatory Visit: Payer: Self-pay | Admitting: *Deleted

## 2021-05-09 ENCOUNTER — Telehealth: Payer: Self-pay

## 2021-05-09 DIAGNOSIS — E119 Type 2 diabetes mellitus without complications: Secondary | ICD-10-CM

## 2021-05-09 MED ORDER — ACCU-CHEK AVIVA PLUS VI STRP: ORAL_STRIP | 3 refills | Status: DC

## 2021-05-09 NOTE — Telephone Encounter (Signed)
Patient Name: [name] [facility] Gender: Female DOB: 07-06-1945 Age: 76 Y 7 D Return Phone Number: [phone] (Primary) Address: City/ State/ Zip: [place]  [address] Client [facility] [place] Client [facility] [place] Night Provider Billey Chang- MD Contact Type Call Who Is [facility] Call Type Pharmacy Send to RN Chief Complaint Prescription Refill or Medication Request (non symptomatic) Reason for Call Request to speak to Physician Initial Comment Caller states he is with [facility] and needs to get a aviva diabetes meter test kit. Pharmacy Name [facility] Pharmacist Name [place] Number [phone] Translation No Nurse Assessment Nurse: Valetta Close, RN, Helene Kelp Date/Time Eilene Ghazi Time): 05/09/2021 6:51:31 PM Confirm and document reason for call. If symptomatic, describe symptoms. ---Caller states they are a pharmacy requesting diabetes meter kit - unable to give physician's NPI - will call the office in the morning. Does the patient have any new or worsening symptoms? ---No Disp. Time Eilene Ghazi Time) Disposition Final User 05/09/2021 6:30:59 PM Send To Nurse Graylon Gunning, RN, Rhonda 05/09/2021 6:54:07 PM Pharmacy Call Valetta Close, RN, Helene Kelp Reason: Unable to provide physician NPI number - will call the office in the morning. 05/09/2021 6:54:15 PM Clinical Call Yes Valetta Close, RN, Helene Kelp

## 2021-05-09 NOTE — Telephone Encounter (Signed)
Rx strips send to [facility]

## 2021-05-09 NOTE — Telephone Encounter (Signed)
error 

## 2021-05-10 ENCOUNTER — Other Ambulatory Visit: Payer: Self-pay

## 2021-05-11 ENCOUNTER — Other Ambulatory Visit: Payer: Self-pay

## 2021-05-11 ENCOUNTER — Inpatient Hospital Stay: Payer: Medicare HMO

## 2021-05-11 ENCOUNTER — Telehealth: Payer: Self-pay

## 2021-05-11 ENCOUNTER — Inpatient Hospital Stay: Payer: Medicare HMO | Admitting: Oncology

## 2021-05-11 ENCOUNTER — Encounter: Payer: Self-pay | Admitting: *Deleted

## 2021-05-11 VITALS — BP 127/88 | HR 82 | Temp 98.8°F | Resp 20 | Ht 65.0 in | Wt 155.0 lb

## 2021-05-11 DIAGNOSIS — I10 Essential (primary) hypertension: Secondary | ICD-10-CM | POA: Diagnosis not present

## 2021-05-11 DIAGNOSIS — G629 Polyneuropathy, unspecified: Secondary | ICD-10-CM | POA: Diagnosis not present

## 2021-05-11 DIAGNOSIS — E119 Type 2 diabetes mellitus without complications: Secondary | ICD-10-CM | POA: Diagnosis not present

## 2021-05-11 DIAGNOSIS — C251 Malignant neoplasm of body of pancreas: Secondary | ICD-10-CM | POA: Diagnosis not present

## 2021-05-11 DIAGNOSIS — R17 Unspecified jaundice: Secondary | ICD-10-CM | POA: Diagnosis not present

## 2021-05-11 DIAGNOSIS — Z95828 Presence of other vascular implants and grafts: Secondary | ICD-10-CM

## 2021-05-11 DIAGNOSIS — C787 Secondary malignant neoplasm of liver and intrahepatic bile duct: Secondary | ICD-10-CM | POA: Diagnosis not present

## 2021-05-11 DIAGNOSIS — C259 Malignant neoplasm of pancreas, unspecified: Secondary | ICD-10-CM

## 2021-05-11 DIAGNOSIS — G893 Neoplasm related pain (acute) (chronic): Secondary | ICD-10-CM | POA: Diagnosis not present

## 2021-05-11 DIAGNOSIS — M1990 Unspecified osteoarthritis, unspecified site: Secondary | ICD-10-CM | POA: Diagnosis not present

## 2021-05-11 DIAGNOSIS — Z5111 Encounter for antineoplastic chemotherapy: Secondary | ICD-10-CM | POA: Diagnosis not present

## 2021-05-11 LAB — CBC WITH DIFFERENTIAL (CANCER CENTER ONLY)
Abs Immature Granulocytes: 0.03 10*3/uL (ref 0.00–0.07)
Basophils Absolute: 0 10*3/uL (ref 0.0–0.1)
Basophils Relative: 1 %
Eosinophils Absolute: 0.1 10*3/uL (ref 0.0–0.5)
Eosinophils Relative: 1 %
HCT: 37.7 % (ref 36.0–46.0)
Hemoglobin: 12.4 g/dL (ref 12.0–15.0)
Immature Granulocytes: 1 %
Lymphocytes Relative: 35 %
Lymphs Abs: 2.1 10*3/uL (ref 0.7–4.0)
MCH: 32 pg (ref 26.0–34.0)
MCHC: 32.9 g/dL (ref 30.0–36.0)
MCV: 97.2 fL (ref 80.0–100.0)
Monocytes Absolute: 0.6 10*3/uL (ref 0.1–1.0)
Monocytes Relative: 10 %
Neutro Abs: 3.3 10*3/uL (ref 1.7–7.7)
Neutrophils Relative %: 52 %
Platelet Count: 112 10*3/uL — ABNORMAL LOW (ref 150–400)
RBC: 3.88 MIL/uL (ref 3.87–5.11)
RDW: 15.2 % (ref 11.5–15.5)
WBC Count: 6.2 10*3/uL (ref 4.0–10.5)
nRBC: 0 % (ref 0.0–0.2)

## 2021-05-11 LAB — CMP (CANCER CENTER ONLY)
ALT: 22 U/L (ref 0–44)
AST: 21 U/L (ref 15–41)
Albumin: 3.8 g/dL (ref 3.5–5.0)
Alkaline Phosphatase: 101 U/L (ref 38–126)
Anion gap: 9 (ref 5–15)
BUN: 12 mg/dL (ref 8–23)
CO2: 23 mmol/L (ref 22–32)
Calcium: 9 mg/dL (ref 8.9–10.3)
Chloride: 104 mmol/L (ref 98–111)
Creatinine: 0.77 mg/dL (ref 0.44–1.00)
GFR, Estimated: >60 mL/min (ref >60)
Glucose, Bld: 131 mg/dL — ABNORMAL HIGH (ref 70–99)
Potassium: 4.1 mmol/L (ref 3.5–5.1)
Sodium: 136 mmol/L (ref 135–145)
Total Bilirubin: 0.4 mg/dL (ref 0.3–1.2)
Total Protein: 6 g/dL — ABNORMAL LOW (ref 6.5–8.1)

## 2021-05-11 MED ORDER — HEPARIN SOD (PORK) LOCK FLUSH 100 UNIT/ML IV SOLN
500.0000 [IU] | Freq: Once | INTRAVENOUS | Status: AC
  Administered 2021-05-11: 500 [IU] via INTRAVENOUS

## 2021-05-11 MED ORDER — ACCU-CHEK GUIDE W/DEVICE KIT: PACK | 0 refills | Status: AC

## 2021-05-11 MED ORDER — SODIUM CHLORIDE 0.9% FLUSH
10.0000 mL | INTRAVENOUS | Status: DC | PRN
  Administered 2021-05-11: 10 mL via INTRAVENOUS

## 2021-05-11 NOTE — Patient Instructions (Signed)

## 2021-05-11 NOTE — Telephone Encounter (Signed)
Meter test kit has been sent into pharmacy.

## 2021-05-11 NOTE — Progress Notes (Signed)
[facility] OFFICE PROGRESS NOTE   Diagnosis: Pancreas cancer  INTERVAL HISTORY:   [name] completed another cycle of gemcitabine/Abraxane on 04/30/2021.  She reports malaise following chemotherapy.  No pain.  Good appetite.  Peripheral neuropathy symptoms remain improved.  Objective:  Vital signs in last 24 hours:  Blood pressure 127/88, pulse 82, temperature 98.8 F (37.1 C), temperature source Oral, resp. rate 20, height 5\' 5"  (1.651 m), weight 155 lb (70.3 kg), SpO2 99 %.    HEENT: No thrush or ulcers Resp: Lungs clear bilaterally Cardio: Regular rate and rhythm GI: No hepatosplenomegaly, nontender, no mass Vascular: No leg edema   Portacath/PICC-without erythema  Lab Results:  Lab Results  Component Value Date   WBC 6.2 05/11/2021   HGB 12.4 05/11/2021   HCT 37.7 05/11/2021   MCV 97.2 05/11/2021   PLT 112 (L) 05/11/2021   NEUTROABS 3.3 05/11/2021    CMP  Lab Results  Component Value Date   NA 136 05/11/2021   K 4.1 05/11/2021   CL 104 05/11/2021   CO2 23 05/11/2021   GLUCOSE 131 (H) 05/11/2021   BUN 12 05/11/2021   CREATININE 0.77 05/11/2021   CALCIUM 9.0 05/11/2021   PROT 6.0 (L) 05/11/2021   ALBUMIN 3.8 05/11/2021   AST 21 05/11/2021   ALT 22 05/11/2021   ALKPHOS 101 05/11/2021   BILITOT 0.4 05/11/2021   GFRNONAA >60 05/11/2021   GFRAA 71 02/02/2019    Lab Results  Component Value Date   CAN199 3,026 (H) 04/27/2021    Lab Results  Component Value Date   INR 1.1 10/14/2020   LABPROT 14.1 10/14/2020    Imaging:  CT ABDOMEN PELVIS W CONTRAST  Result Date: 05/09/2021 CLINICAL DATA:  Metastatic pancreatic cancer, restaging. Currently on chemotherapy. EXAM: CT ABDOMEN AND PELVIS WITH CONTRAST TECHNIQUE: Multidetector CT imaging of the abdomen and pelvis was performed using the standard protocol following bolus administration of intravenous contrast. RADIATION DOSE REDUCTION: This exam was performed according to the  departmental dose-optimization program which includes automated exposure control, adjustment of the mA and/or kV according to patient size and/or use of iterative reconstruction technique. CONTRAST:  84mL OMNIPAQUE IOHEXOL 300 MG/ML  SOLN COMPARISON:  12/18/2020. FINDINGS: Lower chest: Minimal peribronchovascular nodularity in the right lower lobe, likely postinfectious in etiology. Scattered peripheral nodules in the left lower lobe measure up to 4 mm (4/3), unchanged. Heart size normal. Atherosclerotic calcification of the aorta and coronary arteries. No pericardial or pleural effusion. Distal esophagus is grossly unremarkable. Small hiatal hernia. Hepatobiliary: Vague areas of heterogeneous nodular low-attenuation in the right hepatic lobe appear minimally improved. Index lesion in the periphery measures 1.5 cm (2/16), previously 1.8 cm when remeasured. Segment 4 lesion measures 7 mm (2/20), previously 10 mm. Liver is slightly enlarged, 18.7 cm. Pneumobilia. Common bile duct wall stent is in place. Gallbladder is unremarkable. Pancreas: A slightly hypodense mass in the pancreatic neck/body/proximal tail measures 1.8 x 5.6 cm (2/20), stable from 12/18/2020. Atrophy of the pancreatic tail. Mass narrows the portal vein (2/18), is contiguous with the celiac trunk bifurcation, encases/attenuates the splenic artery (2/17-18) and minimally attenuates the superior mesenteric vein. Spleen: Negative. Adrenals/Urinary Tract: Right adrenal gland is unremarkable. There may be slight nodular thickening of the body of the left adrenal gland. Low-attenuation lesion in the upper pole right kidney measures 3.4 cm, indicative of a cyst. Additional smaller low-attenuation lesions in the kidneys are too small to definitively characterize. Ureters are decompressed. Bladder is grossly unremarkable. Stomach/Bowel:  Small hiatal hernia. Stomach, small bowel and appendix are otherwise unremarkable. Fair amount of stool in the colon is  indicative of constipation. Colon is otherwise unremarkable. Vascular/Lymphatic: Atherosclerotic calcification of the aorta. 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 Vascular involvement of the above described pancreatic mass is described in that section. No pathologically enlarged lymph nodes. Small amount of fluid lateral to the right inguinal canal, nonspecific (2/70). Reproductive: Hysterectomy.  No adnexal mass. Other: Small bilateral inguinal hernias contain fat. No free fluid. Mesenteries and peritoneum are otherwise unremarkable. Musculoskeletal: Degenerative changes in the spine. No worrisome lytic or sclerotic lesions. IMPRESSION: 1. Pancreatic mass and associated vascular involvement, stable from 12/18/2020. Hepatic metastatic disease appears minimally improved in the interval. 2. Mild hepatomegaly. Pneumobilia with common bile duct wall stent in place. 3. Continued stability of tiny bilateral lower lobe pulmonary nodules. 4. Aortic atherosclerosis (ICD10-I70.0). Coronary artery calcification. Electronically Signed   By: Lorin Picket M.D.   On: 05/09/2021 10:37    Medications: I have reviewed the patient's current medications.   Assessment/Plan: Adenocarcinoma the pancreas body, stage IV (cT4,cN0,pM1) Ultrasound abdomen 08/18/2020-possible hypoechoic pancreas body mass, small hypoechoic liver areas MRI abdomen 08/27/2020-pancreas body mass, multiple hepatic lesions consistent with metastases, right abdominal omental nodularity, pancreas mass extends to the celiac bifurcation and abuts the splenic vein and splenoportal confluence CT chest 09/07/2020-scattered pulmonary nodules concerning for metastases, pancreas body mass, hepatic metastases Ultrasound-guided biopsy of a right liver lesion 09/08/2020-adenocarcinoma consistent with a pancreas primary CT abdomen/pelvis 09/24/2020-pancreas neck mass, omental and peritoneal nodularity-unchanged, multiple hypoenhancing liver masses-unchanged,  numerous small pulmonary nodules Cycle 1 gemcitabine/Abraxane 10/04/2020 Cycle 2 gemcitabine/Abraxane 10/27/2020 Cycle 3 gemcitabine/Abraxane 11/11/2018 Cycle 4 gemcitabine/Abraxane 11/23/2020 Cycle 5 gemcitabine/Abraxane 12/08/2020 CT abdomen/pelvis 12/18/2020-stable pancreas mass, stable and improved liver lesions, resolution of omental soft tissue density, stable indeterminate lung nodules, no disease progression Cycle 6 gemcitabine/Abraxane 12/22/2020 Cycle 7 gemcitabine 01/05/2021, 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  Cycle 8 gemcitabine/Abraxane 01/19/2021 Cycle 9 gemcitabine/Abraxane 02/01/2021 Cycle 10 Gemcitabine 02/16/2021, Abraxane held due to increased neuropathy Cycle 11 Gemcitabine 03/05/2021, 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 Cycle 12 gemcitabine 03/14/2021, 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 Cycle 13 gemcitabine 04/02/2021, 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 Cycle 14 gemcitabine/Abraxane 04/16/2021, Abraxane resumed at a reduced dose Cycle 15 gemcitabine/Abraxane 04/30/2021 CT abdomen/pelvis 05/08/2021-stable pancreas mass, liver metastases are slightly smaller, stable tiny bilateral lower lung nodules Cycle 16 gemcitabine/Abraxane 05/14/2021   2.  Pain secondary #1 3.  Diabetes 4.  Hypertension 5.  Osteoarthritis 6.  Port-A-Cath placement 09/22/2020 7.  Admission with jaundice 09/24/2020.  MRI-new abrupt constriction of the common hepatic duct.  The infiltrative pancreatic mass extends medially in the porta hepatis to obstruct the common hepatic duct.  Multiple right hepatic lobe metastases mildly increased in size.  Stent placed into the common bile duct 09/28/2020.       Disposition: Ms. Vining appears stable.  The CA 19-9 has been higher over the past few months, but the restaging CT reveals no evidence of disease progression and improvement in the liver metastases.  I reviewed the CT findings and images with Ms. Avera and her daughter.  It is possible the rise in the CA 19-9 is related to  discontinuing Abraxane in December.  Abraxane has been resumed.  We will follow-up on the CA 19-9 from today.  The plan is to continue gemcitabine/Abraxane.  She will complete another cycle on 05/14/2021.  Ms. Piltz will return for an office and lab visit in 2 weeks.  Betsy Coder,  MD  05/11/2021  11:35 AM

## 2021-05-11 NOTE — Progress Notes (Signed)
Patient seen by Dr. Benay Spice today  Vitals are within treatment parameters.  Labs reviewed by Dr. Benay Spice and are within treatment parameters.  Per physician team, patient is ready for treatment and there are NO modifications to the treatment plan. OK for treatment on 05/14/21

## 2021-05-11 NOTE — Telephone Encounter (Signed)
Meter kit has been sent into pharmacy.

## 2021-05-12 ENCOUNTER — Other Ambulatory Visit: Payer: Self-pay | Admitting: Hematology

## 2021-05-12 LAB — CANCER ANTIGEN 19-9: CA 19-9: 3459 U/mL — ABNORMAL HIGH (ref 0–35)

## 2021-05-13 ENCOUNTER — Other Ambulatory Visit: Payer: Self-pay | Admitting: Oncology

## 2021-05-14 ENCOUNTER — Telehealth: Payer: Self-pay | Admitting: Family Medicine

## 2021-05-14 ENCOUNTER — Other Ambulatory Visit: Payer: Self-pay

## 2021-05-14 ENCOUNTER — Other Ambulatory Visit: Payer: Self-pay | Admitting: Oncology

## 2021-05-14 ENCOUNTER — Inpatient Hospital Stay: Payer: Medicare HMO

## 2021-05-14 VITALS — BP 108/77 | HR 92 | Temp 98.1°F | Ht 65.0 in

## 2021-05-14 DIAGNOSIS — M1990 Unspecified osteoarthritis, unspecified site: Secondary | ICD-10-CM | POA: Diagnosis not present

## 2021-05-14 DIAGNOSIS — C251 Malignant neoplasm of body of pancreas: Secondary | ICD-10-CM | POA: Diagnosis not present

## 2021-05-14 DIAGNOSIS — R17 Unspecified jaundice: Secondary | ICD-10-CM | POA: Diagnosis not present

## 2021-05-14 DIAGNOSIS — C787 Secondary malignant neoplasm of liver and intrahepatic bile duct: Secondary | ICD-10-CM | POA: Diagnosis not present

## 2021-05-14 DIAGNOSIS — G893 Neoplasm related pain (acute) (chronic): Secondary | ICD-10-CM | POA: Diagnosis not present

## 2021-05-14 DIAGNOSIS — C259 Malignant neoplasm of pancreas, unspecified: Secondary | ICD-10-CM

## 2021-05-14 DIAGNOSIS — Z5111 Encounter for antineoplastic chemotherapy: Secondary | ICD-10-CM | POA: Diagnosis not present

## 2021-05-14 DIAGNOSIS — G629 Polyneuropathy, unspecified: Secondary | ICD-10-CM | POA: Diagnosis not present

## 2021-05-14 DIAGNOSIS — I10 Essential (primary) hypertension: Secondary | ICD-10-CM | POA: Diagnosis not present

## 2021-05-14 DIAGNOSIS — E119 Type 2 diabetes mellitus without complications: Secondary | ICD-10-CM | POA: Diagnosis not present

## 2021-05-14 MED ORDER — SODIUM CHLORIDE 0.9% FLUSH
10.0000 mL | INTRAVENOUS | Status: DC | PRN
  Administered 2021-05-14: 10 mL

## 2021-05-14 MED ORDER — SODIUM CHLORIDE 0.9 % IV SOLN: Freq: Once | INTRAVENOUS | Status: AC

## 2021-05-14 MED ORDER — PROCHLORPERAZINE MALEATE 10 MG PO TABS
10.0000 mg | ORAL_TABLET | Freq: Once | ORAL | Status: AC
  Administered 2021-05-14: 10 mg via ORAL
  Filled 2021-05-14: qty 1

## 2021-05-14 MED ORDER — HEPARIN SOD (PORK) LOCK FLUSH 100 UNIT/ML IV SOLN
500.0000 [IU] | Freq: Once | INTRAVENOUS | Status: AC | PRN
  Administered 2021-05-14: 500 [IU]

## 2021-05-14 MED ORDER — SODIUM CHLORIDE 0.9 % IV SOLN: INTRAVENOUS | Status: AC

## 2021-05-14 MED ORDER — PACLITAXEL PROTEIN-BOUND CHEMO INJECTION 100 MG
100.0000 mg/m2 | Freq: Once | INTRAVENOUS | Status: AC
  Administered 2021-05-14: 175 mg via INTRAVENOUS
  Filled 2021-05-14: qty 35

## 2021-05-14 MED ORDER — PACLITAXEL PROTEIN-BOUND CHEMO INJECTION 100 MG
50.0000 mg/m2 | Freq: Once | INTRAVENOUS | Status: DC
  Filled 2021-05-14: qty 20

## 2021-05-14 MED ORDER — SODIUM CHLORIDE 0.9 % IV SOLN
800.0000 mg/m2 | Freq: Once | INTRAVENOUS | Status: AC
  Administered 2021-05-14: 1444 mg via INTRAVENOUS
  Filled 2021-05-14: qty 26.3

## 2021-05-14 NOTE — Progress Notes (Signed)
Patient presents for treatment. RN assessment completed along with the following:  Labs/vitals reviewed - Yes, and within treatment parameters.   Weight within 10% of previous measurement - Yes Informed consent completed and reflects current therapy/intent - Yes, on date 10/12/20             Provider progress note reviewed - Patient not seen by provider today. Most recent note dated 05/11/21 reviewed. Treatment/Antibody/Supportive plan reviewed - Yes, and there are no adjustments needed for today's treatment. S&H and other orders reviewed - Yes, and there are no additional orders identified. Previous treatment date reviewed - Yes, and the appropriate amount of time has elapsed between treatments.  Patient to proceed with treatment.

## 2021-05-14 NOTE — Progress Notes (Signed)
After releasing chemo and reviewing MD visit with patient that occurred on 05/11/21, patient stated that she thought Dr. Benay Spice was going to put her back on full strength Abraxane. I discussed this with Dr. Benay Spice and he said that yes we could increase her to 100mg /m2 if the patient was agreeable. Patient is agreeable. Dr. Benay Spice notified. Roselind Messier Pharmacist notified of increase in dosage.

## 2021-05-14 NOTE — Telephone Encounter (Signed)
[facility] calling re diabetic supply's.   [phone] ext [phone] Judson Roch.   Needs a call back.

## 2021-05-14 NOTE — Patient Instructions (Signed)
[facility]  Discharge Instructions: Thank you for choosing [facility] to provide your oncology and hematology care.   If you have a lab appointment with the [facility], please go directly to the [facility] and check in at the registration area.   Wear comfortable clothing and clothing appropriate for easy access to any Portacath or PICC line.   We strive to give you quality time with your provider. You may need to reschedule your appointment if you arrive late (15 or more minutes).  Arriving late affects you and other patients whose appointments are after yours.  Also, if you miss three or more appointments without notifying the office, you may be dismissed from the clinic at the providers discretion.      For prescription refill requests, have your pharmacy contact our office and allow 72 hours for refills to be completed.    Today you received the following chemotherapy and/or immunotherapy agents: Abraxane/Gemzar  Nanoparticle Albumin-Bound Paclitaxel injection What is this medication? NANOPARTICLE ALBUMIN-BOUND PACLITAXEL (Na no PAHR ti kuhl  al BYOO muhn-bound  PAK li TAX el) is a chemotherapy drug. It targets fast dividing cells, like cancer cells, and causes these cells to die. This medicine is used to treat advanced breast cancer, lung cancer, and pancreatic cancer. This medicine may be used for other purposes; ask your health care provider or pharmacist if you have questions. COMMON BRAND NAME(S): Abraxane What should I tell my care team before I take this medication? They need to know if you have any of these conditions: kidney disease liver disease low blood counts, like low white cell, platelet, or red cell counts lung or breathing disease, like asthma tingling of the fingers or toes, or other nerve disorder an unusual or allergic reaction to paclitaxel, albumin, other chemotherapy, other medicines, foods, dyes, or  preservatives pregnant or trying to get pregnant breast-feeding How should I use this medication? This drug is given as an infusion into a vein. It is administered in a hospital or clinic by a specially trained health care professional. Talk to your pediatrician regarding the use of this medicine in children. Special care may be needed. Overdosage: If you think you have taken too much of this medicine contact a poison control center or emergency room at once. NOTE: This medicine is only for you. Do not share this medicine with others. What if I miss a dose? It is important not to miss your dose. Call your doctor or health care professional if you are unable to keep an appointment. What may interact with this medication? This medicine may interact with the following medications: antiviral medicines for hepatitis, HIV or AIDS certain antibiotics like erythromycin and clarithromycin certain medicines for fungal infections like ketoconazole and itraconazole certain medicines for seizures like carbamazepine, phenobarbital, phenytoin gemfibrozil nefazodone rifampin St. John's wort This list may not describe all possible interactions. Give your health care provider a list of all the medicines, herbs, non-prescription drugs, or dietary supplements you use. Also tell them if you smoke, drink alcohol, or use illegal drugs. Some items may interact with your medicine. What should I watch for while using this medication? Your condition will be monitored carefully while you are receiving this medicine. You will need important blood work done while you are taking this medicine. This medicine can cause serious allergic reactions. If you experience allergic reactions like skin rash, itching or hives, swelling of the face, lips, or tongue, tell your doctor or  health care professional right away. In some cases, you may be given additional medicines to help with side effects. Follow all directions for their  use. This drug may make you feel generally unwell. This is not uncommon, as chemotherapy can affect healthy cells as well as cancer cells. Report any side effects. Continue your course of treatment even though you feel ill unless your doctor tells you to stop. Call your doctor or health care professional for advice if you get a fever, chills or sore throat, or other symptoms of a cold or flu. Do not treat yourself. This drug decreases your body's ability to fight infections. Try to avoid being around people who are sick. This medicine may increase your risk to bruise or bleed. Call your doctor or health care professional if you notice any unusual bleeding. Be careful brushing and flossing your teeth or using a toothpick because you may get an infection or bleed more easily. If you have any dental work done, tell your dentist you are receiving this medicine. Avoid taking products that contain aspirin, acetaminophen, ibuprofen, naproxen, or ketoprofen unless instructed by your doctor. These medicines may hide a fever. Do not become pregnant while taking this medicine or for 6 months after stopping it. Women should inform their doctor if they wish to become pregnant or think they might be pregnant. Men should not father a child while taking this medicine or for 3 months after stopping it. There is a potential for serious side effects to an unborn child. Talk to your health care professional or pharmacist for more information. Do not breast-feed an infant while taking this medicine or for 2 weeks after stopping it. This medicine may interfere with the ability to get pregnant or to father a child. You should talk to your doctor or health care professional if you are concerned about your fertility. What side effects may I notice from receiving this medication? Side effects that you should report to your doctor or health care professional as soon as possible: allergic reactions like skin rash, itching or hives,  swelling of the face, lips, or tongue breathing problems changes in vision fast, irregular heartbeat low blood pressure mouth sores pain, tingling, numbness in the hands or feet signs of decreased platelets or bleeding - bruising, pinpoint red spots on the skin, black, tarry stools, blood in the urine signs of decreased red blood cells - unusually weak or tired, feeling faint or lightheaded, falls signs of infection - fever or chills, cough, sore throat, pain or difficulty passing urine signs and symptoms of liver injury like dark yellow or brown urine; general ill feeling or flu-like symptoms; light-colored stools; loss of appetite; nausea; right upper belly pain; unusually weak or tired; yellowing of the eyes or skin swelling of the ankles, feet, hands unusually slow heartbeat Side effects that usually do not require medical attention (report to your doctor or health care professional if they continue or are bothersome): diarrhea hair loss loss of appetite nausea, vomiting tiredness This list may not describe all possible side effects. Call your doctor for medical advice about side effects. You may report side effects to FDA at 1-800-FDA-1088. Where should I keep my medication? This drug is given in a hospital or clinic and will not be stored at home. NOTE: This sheet is a summary. It may not cover all possible information. If you have questions about this medicine, talk to your doctor, pharmacist, or health care provider.  2022 Elsevier/Gold Standard (2016-11-05 00:00:00)  Gemcitabine  injection What is this medication? GEMCITABINE (jem SYE ta been) is a chemotherapy drug. This medicine is used to treat many types of cancer like breast cancer, lung cancer, pancreatic cancer, and ovarian cancer. This medicine may be used for other purposes; ask your health care provider or pharmacist if you have questions. COMMON BRAND NAME(S): Gemzar, Infugem What should I tell my care team before I  take this medication? They need to know if you have any of these conditions: blood disorders infection kidney disease liver disease lung or breathing disease, like asthma recent or ongoing radiation therapy an unusual or allergic reaction to gemcitabine, other chemotherapy, other medicines, foods, dyes, or preservatives pregnant or trying to get pregnant breast-feeding How should I use this medication? This drug is given as an infusion into a vein. It is administered in a hospital or clinic by a specially trained health care professional. Talk to your pediatrician regarding the use of this medicine in children. Special care may be needed. Overdosage: If you think you have taken too much of this medicine contact a poison control center or emergency room at once. NOTE: This medicine is only for you. Do not share this medicine with others. What if I miss a dose? It is important not to miss your dose. Call your doctor or health care professional if you are unable to keep an appointment. What may interact with this medication? medicines to increase blood counts like filgrastim, pegfilgrastim, sargramostim some other chemotherapy drugs like cisplatin vaccines Talk to your doctor or health care professional before taking any of these medicines: acetaminophen aspirin ibuprofen ketoprofen naproxen This list may not describe all possible interactions. Give your health care provider a list of all the medicines, herbs, non-prescription drugs, or dietary supplements you use. Also tell them if you smoke, drink alcohol, or use illegal drugs. Some items may interact with your medicine. What should I watch for while using this medication? Visit your doctor for checks on your progress. This drug may make you feel generally unwell. This is not uncommon, as chemotherapy can affect healthy cells as well as cancer cells. Report any side effects. Continue your course of treatment even though you feel ill  unless your doctor tells you to stop. In some cases, you may be given additional medicines to help with side effects. Follow all directions for their use. Call your doctor or health care professional for advice if you get a fever, chills or sore throat, or other symptoms of a cold or flu. Do not treat yourself. This drug decreases your body's ability to fight infections. Try to avoid being around people who are sick. This medicine may increase your risk to bruise or bleed. Call your doctor or health care professional if you notice any unusual bleeding. Be careful brushing and flossing your teeth or using a toothpick because you may get an infection or bleed more easily. If you have any dental work done, tell your dentist you are receiving this medicine. Avoid taking products that contain aspirin, acetaminophen, ibuprofen, naproxen, or ketoprofen unless instructed by your doctor. These medicines may hide a fever. Do not become pregnant while taking this medicine or for 6 months after stopping it. Women should inform their doctor if they wish to become pregnant or think they might be pregnant. Men should not father a child while taking this medicine and for 3 months after stopping it. There is a potential for serious side effects to an unborn child. Talk to your health care professional  or pharmacist for more information. Do not breast-feed an infant while taking this medicine or for at least 1 week after stopping it. Men should inform their doctors if they wish to father a child. This medicine may lower sperm counts. Talk with your doctor or health care professional if you are concerned about your fertility. What side effects may I notice from receiving this medication? Side effects that you should report to your doctor or health care professional as soon as possible: allergic reactions like skin rash, itching or hives, swelling of the face, lips, or tongue breathing problems pain, redness, or irritation  at site where injected signs and symptoms of a dangerous change in heartbeat or heart rhythm like chest pain; dizziness; fast or irregular heartbeat; palpitations; feeling faint or lightheaded, falls; breathing problems signs of decreased platelets or bleeding - bruising, pinpoint red spots on the skin, black, tarry stools, blood in the urine signs of decreased red blood cells - unusually weak or tired, feeling faint or lightheaded, falls signs of infection - fever or chills, cough, sore throat, pain or difficulty passing urine signs and symptoms of kidney injury like trouble passing urine or change in the amount of urine signs and symptoms of liver injury like dark yellow or brown urine; general ill feeling or flu-like symptoms; light-colored stools; loss of appetite; nausea; right upper belly pain; unusually weak or tired; yellowing of the eyes or skin swelling of ankles, feet, hands Side effects that usually do not require medical attention (report to your doctor or health care professional if they continue or are bothersome): constipation diarrhea hair loss loss of appetite nausea rash vomiting This list may not describe all possible side effects. Call your doctor for medical advice about side effects. You may report side effects to FDA at 1-800-FDA-1088. Where should I keep my medication? This drug is given in a hospital or clinic and will not be stored at home. NOTE: This sheet is a summary. It may not cover all possible information. If you have questions about this medicine, talk to your doctor, pharmacist, or health care provider.  2022 Elsevier/Gold Standard (2017-05-28 00:00:00)     To help prevent nausea and vomiting after your treatment, we encourage you to take your nausea medication as directed.  BELOW ARE SYMPTOMS THAT SHOULD BE REPORTED IMMEDIATELY: *FEVER GREATER THAN 100.4 F (38 C) OR HIGHER *CHILLS OR SWEATING *NAUSEA AND VOMITING THAT IS NOT CONTROLLED WITH YOUR  NAUSEA MEDICATION *UNUSUAL SHORTNESS OF BREATH *UNUSUAL BRUISING OR BLEEDING *URINARY PROBLEMS (pain or burning when urinating, or frequent urination) *BOWEL PROBLEMS (unusual diarrhea, constipation, pain near the anus) TENDERNESS IN MOUTH AND THROAT WITH OR WITHOUT PRESENCE OF ULCERS (sore throat, sores in mouth, or a toothache) UNUSUAL RASH, SWELLING OR PAIN  UNUSUAL VAGINAL DISCHARGE OR ITCHING   Items with * indicate a potential emergency and should be followed up as soon as possible or go to the Emergency Department if any problems should occur.  Please show the CHEMOTHERAPY ALERT CARD or IMMUNOTHERAPY ALERT CARD at check-in to the Emergency Department and triage nurse.  Should you have questions after your visit or need to cancel or reschedule your appointment, please contact [facility]  Dept: [phone]  and follow the prompts.  Office hours are 8:00 a.m. to 4:30 p.m. Monday - Friday. Please note that voicemails left after 4:00 p.m. may not be returned until the following business day.  We are closed weekends and major holidays. You have access  to a nurse at all times for urgent questions. Please call the main number to the clinic Dept: [phone] and follow the prompts.   For any non-urgent questions, you may also contact your provider using MyChart. We now offer e-Visits for anyone 42 and older to request care online for non-urgent symptoms. For details visit [email or web address].   Also download the MyChart app! Go to the app store, search "MyChart", open the app, select [facility], and log in with your MyChart username and password.  Due to Covid, a mask is required upon entering the hospital/clinic. If you do not have a mask, one will be given to you upon arrival. For doctor visits, patients may have 1 support person aged 21 or older with them. For treatment visits, patients cannot have anyone with them due to current Covid guidelines and our  immunocompromised population.

## 2021-05-15 ENCOUNTER — Ambulatory Visit: Payer: Medicare HMO

## 2021-05-15 ENCOUNTER — Telehealth: Payer: Self-pay

## 2021-05-15 ENCOUNTER — Other Ambulatory Visit: Payer: Self-pay

## 2021-05-15 NOTE — Telephone Encounter (Signed)
I spoke with [facility] regarding patients test strips. Prescription was fixed.

## 2021-05-15 NOTE — Telephone Encounter (Signed)
I spoke with [facility] regarding patients test strips.

## 2021-05-17 ENCOUNTER — Ambulatory Visit (INDEPENDENT_AMBULATORY_CARE_PROVIDER_SITE_OTHER): Payer: Medicare HMO | Admitting: *Deleted

## 2021-05-17 ENCOUNTER — Other Ambulatory Visit: Payer: Self-pay

## 2021-05-17 DIAGNOSIS — E538 Deficiency of other specified B group vitamins: Secondary | ICD-10-CM | POA: Diagnosis not present

## 2021-05-17 MED ORDER — CYANOCOBALAMIN 1000 MCG/ML IJ SOLN
1000.0000 ug | Freq: Once | INTRAMUSCULAR | Status: AC
  Administered 2021-05-17: 1000 ug via INTRAMUSCULAR

## 2021-05-17 NOTE — Progress Notes (Signed)
Per orders of Dr. Jonni Sanger, injection of B 12 given in right deltoid per patient preference given by Zacarias Pontes, CMA. Patient tolerated injection well. Patient supplied medication. ?

## 2021-05-18 ENCOUNTER — Other Ambulatory Visit: Payer: Self-pay | Admitting: *Deleted

## 2021-05-18 MED ORDER — ALCOHOL SWABS PADS: MEDICATED_PAD | 4 refills | Status: DC

## 2021-05-23 ENCOUNTER — Ambulatory Visit
Admission: RE | Admit: 2021-05-23 | Discharge: 2021-05-23 | Disposition: A | Discharge status: Home / Self Care | Payer: Medicare HMO | Source: Ambulatory Visit | Attending: Family Medicine | Admitting: Family Medicine

## 2021-05-23 DIAGNOSIS — Z1231 Encounter for screening mammogram for malignant neoplasm of breast: Secondary | ICD-10-CM

## 2021-05-24 DIAGNOSIS — E119 Type 2 diabetes mellitus without complications: Secondary | ICD-10-CM | POA: Diagnosis not present

## 2021-05-25 ENCOUNTER — Inpatient Hospital Stay: Payer: Medicare HMO | Attending: Hematology

## 2021-05-25 ENCOUNTER — Inpatient Hospital Stay: Payer: Medicare HMO

## 2021-05-25 ENCOUNTER — Inpatient Hospital Stay: Payer: Medicare HMO | Admitting: Oncology

## 2021-05-25 ENCOUNTER — Other Ambulatory Visit: Payer: Self-pay

## 2021-05-25 VITALS — BP 138/83 | HR 84 | Temp 97.8°F | Resp 19 | Ht 65.0 in | Wt 155.8 lb

## 2021-05-25 DIAGNOSIS — M1990 Unspecified osteoarthritis, unspecified site: Secondary | ICD-10-CM | POA: Diagnosis not present

## 2021-05-25 DIAGNOSIS — R110 Nausea: Secondary | ICD-10-CM | POA: Diagnosis not present

## 2021-05-25 DIAGNOSIS — Z452 Encounter for adjustment and management of vascular access device: Secondary | ICD-10-CM | POA: Diagnosis not present

## 2021-05-25 DIAGNOSIS — I10 Essential (primary) hypertension: Secondary | ICD-10-CM | POA: Diagnosis not present

## 2021-05-25 DIAGNOSIS — Z95828 Presence of other vascular implants and grafts: Secondary | ICD-10-CM

## 2021-05-25 DIAGNOSIS — E119 Type 2 diabetes mellitus without complications: Secondary | ICD-10-CM | POA: Insufficient documentation

## 2021-05-25 DIAGNOSIS — C259 Malignant neoplasm of pancreas, unspecified: Secondary | ICD-10-CM | POA: Diagnosis not present

## 2021-05-25 DIAGNOSIS — G893 Neoplasm related pain (acute) (chronic): Secondary | ICD-10-CM | POA: Diagnosis not present

## 2021-05-25 DIAGNOSIS — Z5111 Encounter for antineoplastic chemotherapy: Secondary | ICD-10-CM | POA: Insufficient documentation

## 2021-05-25 DIAGNOSIS — R5383 Other fatigue: Secondary | ICD-10-CM | POA: Insufficient documentation

## 2021-05-25 LAB — CBC WITH DIFFERENTIAL (CANCER CENTER ONLY)
Abs Immature Granulocytes: 0.02 10*3/uL (ref 0.00–0.07)
Basophils Absolute: 0 10*3/uL (ref 0.0–0.1)
Basophils Relative: 1 %
Eosinophils Absolute: 0.1 10*3/uL (ref 0.0–0.5)
Eosinophils Relative: 2 %
HCT: 36 % (ref 36.0–46.0)
Hemoglobin: 12 g/dL (ref 12.0–15.0)
Immature Granulocytes: 0 %
Lymphocytes Relative: 37 %
Lymphs Abs: 2.3 10*3/uL (ref 0.7–4.0)
MCH: 32.2 pg (ref 26.0–34.0)
MCHC: 33.3 g/dL (ref 30.0–36.0)
MCV: 96.5 fL (ref 80.0–100.0)
Monocytes Absolute: 0.6 10*3/uL (ref 0.1–1.0)
Monocytes Relative: 10 %
Neutro Abs: 3.1 10*3/uL (ref 1.7–7.7)
Neutrophils Relative %: 50 %
Platelet Count: 107 10*3/uL — ABNORMAL LOW (ref 150–400)
RBC: 3.73 MIL/uL — ABNORMAL LOW (ref 3.87–5.11)
RDW: 15.2 % (ref 11.5–15.5)
WBC Count: 6.1 10*3/uL (ref 4.0–10.5)
nRBC: 0 % (ref 0.0–0.2)

## 2021-05-25 LAB — CMP (CANCER CENTER ONLY)
ALT: 21 U/L (ref 0–44)
AST: 23 U/L (ref 15–41)
Albumin: 4 g/dL (ref 3.5–5.0)
Alkaline Phosphatase: 86 U/L (ref 38–126)
Anion gap: 9 (ref 5–15)
BUN: 13 mg/dL (ref 8–23)
CO2: 22 mmol/L (ref 22–32)
Calcium: 9.1 mg/dL (ref 8.9–10.3)
Chloride: 105 mmol/L (ref 98–111)
Creatinine: 0.85 mg/dL (ref 0.44–1.00)
GFR, Estimated: >60 mL/min (ref >60)
Glucose, Bld: 91 mg/dL (ref 70–99)
Potassium: 3.8 mmol/L (ref 3.5–5.1)
Sodium: 136 mmol/L (ref 135–145)
Total Bilirubin: 0.4 mg/dL (ref 0.3–1.2)
Total Protein: 6.7 g/dL (ref 6.5–8.1)

## 2021-05-25 MED ORDER — HEPARIN SOD (PORK) LOCK FLUSH 100 UNIT/ML IV SOLN
500.0000 [IU] | Freq: Once | INTRAVENOUS | Status: AC
  Administered 2021-05-25: 500 [IU] via INTRAVENOUS

## 2021-05-25 MED ORDER — SODIUM CHLORIDE 0.9% FLUSH
10.0000 mL | Freq: Once | INTRAVENOUS | Status: AC
  Administered 2021-05-25: 10 mL via INTRAVENOUS

## 2021-05-25 NOTE — Patient Instructions (Signed)

## 2021-05-25 NOTE — Progress Notes (Signed)
?[facility] ?OFFICE PROGRESS NOTE ? ? ?Diagnosis: Pancreas cancer ? ?INTERVAL HISTORY:  ? ?[name] returns as scheduled.  She completed another treatment with gemcitabine/Abraxane on 05/14/2021.  Abraxane was escalated back to 100 mg per metered squared.  No change in neuropathy symptoms.  She reports malaise and mild nausea for approximately 5 days following chemotherapy.  No pain.  No other complaint. ? ?Objective: ? ?Vital signs in last 24 hours: ? ?Blood pressure 138/83, pulse 84, temperature 97.8 ?F (36.6 ?C), temperature source Oral, resp. rate 19, height '5\' 5"'$  (1.651 m), weight 155 lb 12.8 oz (70.7 kg), SpO2 97 %. ?  ? ?HEENT: No thrush or ulcers, 2 mm ecchymosis at the right buccal mucosa ?Resp: Lungs clear bilaterally ?Cardio: Regular rate and rhythm ?GI: No mass, nontender, no hepatosplenomegaly ?Vascular: No leg edema, the left lower leg is larger than the right side, no erythema or tenderness ?  ? ?Portacath/PICC-without erythema ? ?Lab Results: ? ?Lab Results  ?Component Value Date  ? WBC 6.1 05/25/2021  ? HGB 12.0 05/25/2021  ? HCT 36.0 05/25/2021  ? MCV 96.5 05/25/2021  ? PLT 107 (L) 05/25/2021  ? NEUTROABS 3.1 05/25/2021  ? ? ?CMP  ?Lab Results  ?Component Value Date  ? NA 136 05/25/2021  ? K 3.8 05/25/2021  ? CL 105 05/25/2021  ? CO2 22 05/25/2021  ? GLUCOSE 91 05/25/2021  ? BUN 13 05/25/2021  ? CREATININE 0.85 05/25/2021  ? CALCIUM 9.1 05/25/2021  ? PROT 6.7 05/25/2021  ? ALBUMIN 4.0 05/25/2021  ? AST 23 05/25/2021  ? ALT 21 05/25/2021  ? ALKPHOS 86 05/25/2021  ? BILITOT 0.4 05/25/2021  ? GFRNONAA >60 05/25/2021  ? GFRAA 71 02/02/2019  ? ? ?Lab Results  ?Component Value Date  ? YQI347 3,459 (H) 05/11/2021  ? ? ?MM 3D SCREEN BREAST BILATERAL ? ?Result Date: 05/23/2021 ?CLINICAL DATA:  Screening. EXAM: DIGITAL SCREENING BILATERAL MAMMOGRAM WITH TOMOSYNTHESIS AND CAD TECHNIQUE: Bilateral screening digital craniocaudal and mediolateral oblique mammograms were obtained. Bilateral  screening digital breast tomosynthesis was performed. The images were evaluated with computer-aided detection. COMPARISON:  Previous exam(s). ACR Breast Density Category b: There are scattered areas of fibroglandular density. FINDINGS: There are no findings suspicious for malignancy. IMPRESSION: No mammographic evidence of malignancy. A result letter of this screening mammogram will be mailed directly to the patient. RECOMMENDATION: Screening mammogram in one year. (Code:SM-B-01Y) BI-RADS CATEGORY  1: Negative. Electronically Signed   By: Everlean Alstrom M.D.   On: 05/23/2021 13:58   ? ?Medications: I have reviewed the patient's current medications. ? ? ?Assessment/Plan: ?Adenocarcinoma the pancreas body, stage IV (cT4,cN0,pM1) ?Ultrasound abdomen 08/18/2020-possible hypoechoic pancreas body mass, small hypoechoic liver areas ?MRI abdomen 08/27/2020-pancreas body mass, multiple hepatic lesions consistent with metastases, right abdominal omental nodularity, pancreas mass extends to the celiac bifurcation and abuts the splenic vein and splenoportal confluence ?CT chest 09/07/2020-scattered pulmonary nodules concerning for metastases, pancreas body mass, hepatic metastases ?Ultrasound-guided biopsy of a right liver lesion 09/08/2020-adenocarcinoma consistent with a pancreas primary ?CT abdomen/pelvis 09/24/2020-pancreas neck mass, omental and peritoneal nodularity-unchanged, multiple hypoenhancing liver masses-unchanged, numerous small pulmonary nodules ?Cycle 1 gemcitabine/Abraxane 10/04/2020 ?Cycle 2 gemcitabine/Abraxane 10/27/2020 ?Cycle 3 gemcitabine/Abraxane 11/11/2018 ?Cycle 4 gemcitabine/Abraxane 11/23/2020 ?Cycle 5 gemcitabine/Abraxane 12/08/2020 ?CT abdomen/pelvis 12/18/2020-stable pancreas mass, stable and improved liver lesions, resolution of omental soft tissue density, stable indeterminate lung nodules, no disease progression ?Cycle 6 gemcitabine/Abraxane 12/22/2020 ?Cycle 7 gemcitabine 01/05/2021, Abraxane held due to  neuropathy  ?Cycle 8 gemcitabine/Abraxane 01/19/2021 ?Cycle 9  gemcitabine/Abraxane 02/01/2021 ?Cycle 10 Gemcitabine 02/16/2021, Abraxane held due to increased neuropathy ?Cycle 11 Gemcitabine 03/05/2021, 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 ?Cycle 12 gemcitabine 03/14/2021, 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 ?Cycle 13 gemcitabine 04/02/2021, 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 ?Cycle 14 gemcitabine/Abraxane 04/16/2021, Abraxane resumed at a reduced dose ?Cycle 15 gemcitabine/Abraxane 04/30/2021 ?CT abdomen/pelvis 05/08/2021-stable pancreas mass, liver metastases are slightly smaller, stable tiny bilateral lower lung nodules ?Cycle 16 gemcitabine/Abraxane 05/14/2021, Abraxane escalated back to 100 mg per metered squared ?Cycle 17 gemcitabine/Abraxane 05/28/2021 ?  ?2.  Pain secondary #1 ?3.  Diabetes ?4.  Hypertension ?5.  Osteoarthritis ?6.  Port-A-Cath placement 09/22/2020 ?7.  Admission with jaundice 09/24/2020.  MRI-new abrupt constriction of the common hepatic duct.  The infiltrative pancreatic mass extends medially in the porta hepatis to obstruct the common hepatic duct.  Multiple right hepatic lobe metastases mildly increased in size.  Stent placed into the common bile duct 09/28/2020. ?  ? ? ?Disposition: ?[name] appears stable.  She will complete another treatment gemcitabine/Abraxane on 05/28/2021.  The CA 19-9 has been higher over the past few months.  We will look for improvement with the increased dose of Abraxane. ? ?[name] will return for an office visit in 2 weeks. ? ?Betsy Coder, MD ? ?05/25/2021  ?1:13 PM ? ? ?

## 2021-05-26 LAB — CANCER ANTIGEN 19-9: CA 19-9: 3309 U/mL — ABNORMAL HIGH (ref 0–35)

## 2021-05-28 ENCOUNTER — Inpatient Hospital Stay: Payer: Medicare HMO

## 2021-05-28 ENCOUNTER — Other Ambulatory Visit: Payer: Self-pay

## 2021-05-28 VITALS — BP 152/84 | HR 78 | Temp 98.3°F | Resp 18

## 2021-05-28 DIAGNOSIS — E119 Type 2 diabetes mellitus without complications: Secondary | ICD-10-CM | POA: Diagnosis not present

## 2021-05-28 DIAGNOSIS — R5383 Other fatigue: Secondary | ICD-10-CM | POA: Diagnosis not present

## 2021-05-28 DIAGNOSIS — I10 Essential (primary) hypertension: Secondary | ICD-10-CM | POA: Diagnosis not present

## 2021-05-28 DIAGNOSIS — R110 Nausea: Secondary | ICD-10-CM | POA: Diagnosis not present

## 2021-05-28 DIAGNOSIS — M1990 Unspecified osteoarthritis, unspecified site: Secondary | ICD-10-CM | POA: Diagnosis not present

## 2021-05-28 DIAGNOSIS — Z452 Encounter for adjustment and management of vascular access device: Secondary | ICD-10-CM | POA: Diagnosis not present

## 2021-05-28 DIAGNOSIS — C259 Malignant neoplasm of pancreas, unspecified: Secondary | ICD-10-CM | POA: Diagnosis not present

## 2021-05-28 DIAGNOSIS — Z5111 Encounter for antineoplastic chemotherapy: Secondary | ICD-10-CM | POA: Diagnosis not present

## 2021-05-28 DIAGNOSIS — G893 Neoplasm related pain (acute) (chronic): Secondary | ICD-10-CM | POA: Diagnosis not present

## 2021-05-28 MED ORDER — PROCHLORPERAZINE MALEATE 10 MG PO TABS
10.0000 mg | ORAL_TABLET | Freq: Once | ORAL | Status: AC
  Administered 2021-05-28: 10 mg via ORAL
  Filled 2021-05-28: qty 1

## 2021-05-28 MED ORDER — SODIUM CHLORIDE 0.9% FLUSH
10.0000 mL | INTRAVENOUS | Status: DC | PRN
  Administered 2021-05-28: 10 mL

## 2021-05-28 MED ORDER — SODIUM CHLORIDE 0.9 % IV SOLN: Freq: Once | INTRAVENOUS | Status: AC

## 2021-05-28 MED ORDER — PACLITAXEL PROTEIN-BOUND CHEMO INJECTION 100 MG
100.0000 mg/m2 | Freq: Once | INTRAVENOUS | Status: AC
  Administered 2021-05-28: 175 mg via INTRAVENOUS
  Filled 2021-05-28: qty 35

## 2021-05-28 MED ORDER — SODIUM CHLORIDE 0.9 % IV SOLN
800.0000 mg/m2 | Freq: Once | INTRAVENOUS | Status: AC
  Administered 2021-05-28: 1444 mg via INTRAVENOUS
  Filled 2021-05-28: qty 26.3

## 2021-05-28 MED ORDER — SODIUM CHLORIDE 0.9 % IV SOLN: INTRAVENOUS | Status: AC

## 2021-05-28 MED ORDER — HEPARIN SOD (PORK) LOCK FLUSH 100 UNIT/ML IV SOLN
500.0000 [IU] | Freq: Once | INTRAVENOUS | Status: AC | PRN
  Administered 2021-05-28: 500 [IU]

## 2021-05-28 NOTE — Patient Instructions (Signed)
[facility]  Discharge Instructions: Thank you for choosing [facility] to provide your oncology and hematology care.   If you have a lab appointment with the [facility], please go directly to the [facility] and check in at the registration area.   Wear comfortable clothing and clothing appropriate for easy access to any Portacath or PICC line.   We strive to give you quality time with your provider. You may need to reschedule your appointment if you arrive late (15 or more minutes).  Arriving late affects you and other patients whose appointments are after yours.  Also, if you miss three or more appointments without notifying the office, you may be dismissed from the clinic at the providers discretion.      For prescription refill requests, have your pharmacy contact our office and allow 72 hours for refills to be completed.    Today you received the following chemotherapy and/or immunotherapy agents: Abraxane/Gemzar  Nanoparticle Albumin-Bound Paclitaxel injection What is this medication? NANOPARTICLE ALBUMIN-BOUND PACLITAXEL (Na no PAHR ti kuhl  al BYOO muhn-bound  PAK li TAX el) is a chemotherapy drug. It targets fast dividing cells, like cancer cells, and causes these cells to die. This medicine is used to treat advanced breast cancer, lung cancer, and pancreatic cancer. This medicine may be used for other purposes; ask your health care provider or pharmacist if you have questions. COMMON BRAND NAME(S): Abraxane What should I tell my care team before I take this medication? They need to know if you have any of these conditions: kidney disease liver disease low blood counts, like low white cell, platelet, or red cell counts lung or breathing disease, like asthma tingling of the fingers or toes, or other nerve disorder an unusual or allergic reaction to paclitaxel, albumin, other chemotherapy, other medicines, foods, dyes, or  preservatives pregnant or trying to get pregnant breast-feeding How should I use this medication? This drug is given as an infusion into a vein. It is administered in a hospital or clinic by a specially trained health care professional. Talk to your pediatrician regarding the use of this medicine in children. Special care may be needed. Overdosage: If you think you have taken too much of this medicine contact a poison control center or emergency room at once. NOTE: This medicine is only for you. Do not share this medicine with others. What if I miss a dose? It is important not to miss your dose. Call your doctor or health care professional if you are unable to keep an appointment. What may interact with this medication? This medicine may interact with the following medications: antiviral medicines for hepatitis, HIV or AIDS certain antibiotics like erythromycin and clarithromycin certain medicines for fungal infections like ketoconazole and itraconazole certain medicines for seizures like carbamazepine, phenobarbital, phenytoin gemfibrozil nefazodone rifampin St. John's wort This list may not describe all possible interactions. Give your health care provider a list of all the medicines, herbs, non-prescription drugs, or dietary supplements you use. Also tell them if you smoke, drink alcohol, or use illegal drugs. Some items may interact with your medicine. What should I watch for while using this medication? Your condition will be monitored carefully while you are receiving this medicine. You will need important blood work done while you are taking this medicine. This medicine can cause serious allergic reactions. If you experience allergic reactions like skin rash, itching or hives, swelling of the face, lips, or tongue, tell your doctor or  health care professional right away. In some cases, you may be given additional medicines to help with side effects. Follow all directions for their  use. This drug may make you feel generally unwell. This is not uncommon, as chemotherapy can affect healthy cells as well as cancer cells. Report any side effects. Continue your course of treatment even though you feel ill unless your doctor tells you to stop. Call your doctor or health care professional for advice if you get a fever, chills or sore throat, or other symptoms of a cold or flu. Do not treat yourself. This drug decreases your body's ability to fight infections. Try to avoid being around people who are sick. This medicine may increase your risk to bruise or bleed. Call your doctor or health care professional if you notice any unusual bleeding. Be careful brushing and flossing your teeth or using a toothpick because you may get an infection or bleed more easily. If you have any dental work done, tell your dentist you are receiving this medicine. Avoid taking products that contain aspirin, acetaminophen, ibuprofen, naproxen, or ketoprofen unless instructed by your doctor. These medicines may hide a fever. Do not become pregnant while taking this medicine or for 6 months after stopping it. Women should inform their doctor if they wish to become pregnant or think they might be pregnant. Men should not father a child while taking this medicine or for 3 months after stopping it. There is a potential for serious side effects to an unborn child. Talk to your health care professional or pharmacist for more information. Do not breast-feed an infant while taking this medicine or for 2 weeks after stopping it. This medicine may interfere with the ability to get pregnant or to father a child. You should talk to your doctor or health care professional if you are concerned about your fertility. What side effects may I notice from receiving this medication? Side effects that you should report to your doctor or health care professional as soon as possible: allergic reactions like skin rash, itching or hives,  swelling of the face, lips, or tongue breathing problems changes in vision fast, irregular heartbeat low blood pressure mouth sores pain, tingling, numbness in the hands or feet signs of decreased platelets or bleeding - bruising, pinpoint red spots on the skin, black, tarry stools, blood in the urine signs of decreased red blood cells - unusually weak or tired, feeling faint or lightheaded, falls signs of infection - fever or chills, cough, sore throat, pain or difficulty passing urine signs and symptoms of liver injury like dark yellow or brown urine; general ill feeling or flu-like symptoms; light-colored stools; loss of appetite; nausea; right upper belly pain; unusually weak or tired; yellowing of the eyes or skin swelling of the ankles, feet, hands unusually slow heartbeat Side effects that usually do not require medical attention (report to your doctor or health care professional if they continue or are bothersome): diarrhea hair loss loss of appetite nausea, vomiting tiredness This list may not describe all possible side effects. Call your doctor for medical advice about side effects. You may report side effects to FDA at 1-800-FDA-1088. Where should I keep my medication? This drug is given in a hospital or clinic and will not be stored at home. NOTE: This sheet is a summary. It may not cover all possible information. If you have questions about this medicine, talk to your doctor, pharmacist, or health care provider.  2022 Elsevier/Gold Standard (2016-11-05 00:00:00)  Gemcitabine  injection What is this medication? GEMCITABINE (jem SYE ta been) is a chemotherapy drug. This medicine is used to treat many types of cancer like breast cancer, lung cancer, pancreatic cancer, and ovarian cancer. This medicine may be used for other purposes; ask your health care provider or pharmacist if you have questions. COMMON BRAND NAME(S): Gemzar, Infugem What should I tell my care team before I  take this medication? They need to know if you have any of these conditions: blood disorders infection kidney disease liver disease lung or breathing disease, like asthma recent or ongoing radiation therapy an unusual or allergic reaction to gemcitabine, other chemotherapy, other medicines, foods, dyes, or preservatives pregnant or trying to get pregnant breast-feeding How should I use this medication? This drug is given as an infusion into a vein. It is administered in a hospital or clinic by a specially trained health care professional. Talk to your pediatrician regarding the use of this medicine in children. Special care may be needed. Overdosage: If you think you have taken too much of this medicine contact a poison control center or emergency room at once. NOTE: This medicine is only for you. Do not share this medicine with others. What if I miss a dose? It is important not to miss your dose. Call your doctor or health care professional if you are unable to keep an appointment. What may interact with this medication? medicines to increase blood counts like filgrastim, pegfilgrastim, sargramostim some other chemotherapy drugs like cisplatin vaccines Talk to your doctor or health care professional before taking any of these medicines: acetaminophen aspirin ibuprofen ketoprofen naproxen This list may not describe all possible interactions. Give your health care provider a list of all the medicines, herbs, non-prescription drugs, or dietary supplements you use. Also tell them if you smoke, drink alcohol, or use illegal drugs. Some items may interact with your medicine. What should I watch for while using this medication? Visit your doctor for checks on your progress. This drug may make you feel generally unwell. This is not uncommon, as chemotherapy can affect healthy cells as well as cancer cells. Report any side effects. Continue your course of treatment even though you feel ill  unless your doctor tells you to stop. In some cases, you may be given additional medicines to help with side effects. Follow all directions for their use. Call your doctor or health care professional for advice if you get a fever, chills or sore throat, or other symptoms of a cold or flu. Do not treat yourself. This drug decreases your body's ability to fight infections. Try to avoid being around people who are sick. This medicine may increase your risk to bruise or bleed. Call your doctor or health care professional if you notice any unusual bleeding. Be careful brushing and flossing your teeth or using a toothpick because you may get an infection or bleed more easily. If you have any dental work done, tell your dentist you are receiving this medicine. Avoid taking products that contain aspirin, acetaminophen, ibuprofen, naproxen, or ketoprofen unless instructed by your doctor. These medicines may hide a fever. Do not become pregnant while taking this medicine or for 6 months after stopping it. Women should inform their doctor if they wish to become pregnant or think they might be pregnant. Men should not father a child while taking this medicine and for 3 months after stopping it. There is a potential for serious side effects to an unborn child. Talk to your health care professional  or pharmacist for more information. Do not breast-feed an infant while taking this medicine or for at least 1 week after stopping it. Men should inform their doctors if they wish to father a child. This medicine may lower sperm counts. Talk with your doctor or health care professional if you are concerned about your fertility. What side effects may I notice from receiving this medication? Side effects that you should report to your doctor or health care professional as soon as possible: allergic reactions like skin rash, itching or hives, swelling of the face, lips, or tongue breathing problems pain, redness, or irritation  at site where injected signs and symptoms of a dangerous change in heartbeat or heart rhythm like chest pain; dizziness; fast or irregular heartbeat; palpitations; feeling faint or lightheaded, falls; breathing problems signs of decreased platelets or bleeding - bruising, pinpoint red spots on the skin, black, tarry stools, blood in the urine signs of decreased red blood cells - unusually weak or tired, feeling faint or lightheaded, falls signs of infection - fever or chills, cough, sore throat, pain or difficulty passing urine signs and symptoms of kidney injury like trouble passing urine or change in the amount of urine signs and symptoms of liver injury like dark yellow or brown urine; general ill feeling or flu-like symptoms; light-colored stools; loss of appetite; nausea; right upper belly pain; unusually weak or tired; yellowing of the eyes or skin swelling of ankles, feet, hands Side effects that usually do not require medical attention (report to your doctor or health care professional if they continue or are bothersome): constipation diarrhea hair loss loss of appetite nausea rash vomiting This list may not describe all possible side effects. Call your doctor for medical advice about side effects. You may report side effects to FDA at 1-800-FDA-1088. Where should I keep my medication? This drug is given in a hospital or clinic and will not be stored at home. NOTE: This sheet is a summary. It may not cover all possible information. If you have questions about this medicine, talk to your doctor, pharmacist, or health care provider.  2022 Elsevier/Gold Standard (2017-05-28 00:00:00)     To help prevent nausea and vomiting after your treatment, we encourage you to take your nausea medication as directed.  BELOW ARE SYMPTOMS THAT SHOULD BE REPORTED IMMEDIATELY: *FEVER GREATER THAN 100.4 F (38 C) OR HIGHER *CHILLS OR SWEATING *NAUSEA AND VOMITING THAT IS NOT CONTROLLED WITH YOUR  NAUSEA MEDICATION *UNUSUAL SHORTNESS OF BREATH *UNUSUAL BRUISING OR BLEEDING *URINARY PROBLEMS (pain or burning when urinating, or frequent urination) *BOWEL PROBLEMS (unusual diarrhea, constipation, pain near the anus) TENDERNESS IN MOUTH AND THROAT WITH OR WITHOUT PRESENCE OF ULCERS (sore throat, sores in mouth, or a toothache) UNUSUAL RASH, SWELLING OR PAIN  UNUSUAL VAGINAL DISCHARGE OR ITCHING   Items with * indicate a potential emergency and should be followed up as soon as possible or go to the Emergency Department if any problems should occur.  Please show the CHEMOTHERAPY ALERT CARD or IMMUNOTHERAPY ALERT CARD at check-in to the Emergency Department and triage nurse.  Should you have questions after your visit or need to cancel or reschedule your appointment, please contact [facility]  Dept: [phone]  and follow the prompts.  Office hours are 8:00 a.m. to 4:30 p.m. Monday - Friday. Please note that voicemails left after 4:00 p.m. may not be returned until the following business day.  We are closed weekends and major holidays. You have access  to a nurse at all times for urgent questions. Please call the main number to the clinic Dept: [phone] and follow the prompts.   For any non-urgent questions, you may also contact your provider using MyChart. We now offer e-Visits for anyone 42 and older to request care online for non-urgent symptoms. For details visit [email or web address].   Also download the MyChart app! Go to the app store, search "MyChart", open the app, select [facility], and log in with your MyChart username and password.  Due to Covid, a mask is required upon entering the hospital/clinic. If you do not have a mask, one will be given to you upon arrival. For doctor visits, patients may have 1 support person aged 21 or older with them. For treatment visits, patients cannot have anyone with them due to current Covid guidelines and our  immunocompromised population.

## 2021-05-28 NOTE — Progress Notes (Signed)
Patient presents for treatment. RN assessment completed along with the following: ? ?Labs/vitals reviewed - Yes, and within treatment parameters.   ?Weight within 10% of previous measurement - Yes ?Informed consent completed and reflects current therapy/intent - Yes, on date 10/12/20             ?Provider progress note reviewed - Patient not seen by provider today. Most recent note dated 05/25/21 reviewed. ?Treatment/Antibody/Supportive plan reviewed - Yes, and there are no adjustments needed for today's treatment. ?S&H and other orders reviewed - Yes, and there are no additional orders identified. ?Previous treatment date reviewed - Yes, and the appropriate amount of time has elapsed between treatments. ?Clinic Hand Off Received from - none ? ? ?Patient to proceed with treatment.  ? ?

## 2021-06-01 ENCOUNTER — Other Ambulatory Visit: Payer: Self-pay | Admitting: Family Medicine

## 2021-06-05 ENCOUNTER — Telehealth: Payer: Self-pay | Admitting: Family Medicine

## 2021-06-05 ENCOUNTER — Other Ambulatory Visit: Payer: Self-pay | Admitting: Hematology

## 2021-06-05 NOTE — Telephone Encounter (Signed)
Pt's mother is extremely upset regarding several issues. One of them is regarding a neurologist referral from January. They are still waiting to get it scheduled. Apparently it was closed. She is wanting to speak with [facility]. ?

## 2021-06-08 ENCOUNTER — Inpatient Hospital Stay: Payer: Medicare HMO | Admitting: Oncology

## 2021-06-08 ENCOUNTER — Inpatient Hospital Stay: Payer: Medicare HMO

## 2021-06-08 ENCOUNTER — Encounter: Payer: Self-pay | Admitting: *Deleted

## 2021-06-08 ENCOUNTER — Other Ambulatory Visit: Payer: Self-pay

## 2021-06-08 VITALS — BP 129/80 | HR 83 | Temp 97.8°F | Resp 19 | Ht 65.0 in | Wt 153.2 lb

## 2021-06-08 DIAGNOSIS — M1990 Unspecified osteoarthritis, unspecified site: Secondary | ICD-10-CM | POA: Diagnosis not present

## 2021-06-08 DIAGNOSIS — Z452 Encounter for adjustment and management of vascular access device: Secondary | ICD-10-CM | POA: Diagnosis not present

## 2021-06-08 DIAGNOSIS — R110 Nausea: Secondary | ICD-10-CM | POA: Diagnosis not present

## 2021-06-08 DIAGNOSIS — R5383 Other fatigue: Secondary | ICD-10-CM | POA: Diagnosis not present

## 2021-06-08 DIAGNOSIS — C259 Malignant neoplasm of pancreas, unspecified: Secondary | ICD-10-CM

## 2021-06-08 DIAGNOSIS — Z95828 Presence of other vascular implants and grafts: Secondary | ICD-10-CM

## 2021-06-08 DIAGNOSIS — E119 Type 2 diabetes mellitus without complications: Secondary | ICD-10-CM | POA: Diagnosis not present

## 2021-06-08 DIAGNOSIS — G893 Neoplasm related pain (acute) (chronic): Secondary | ICD-10-CM | POA: Diagnosis not present

## 2021-06-08 DIAGNOSIS — I10 Essential (primary) hypertension: Secondary | ICD-10-CM | POA: Diagnosis not present

## 2021-06-08 DIAGNOSIS — Z5111 Encounter for antineoplastic chemotherapy: Secondary | ICD-10-CM | POA: Diagnosis not present

## 2021-06-08 LAB — CBC WITH DIFFERENTIAL (CANCER CENTER ONLY)
Abs Immature Granulocytes: 0.02 10*3/uL (ref 0.00–0.07)
Basophils Absolute: 0 10*3/uL (ref 0.0–0.1)
Basophils Relative: 1 %
Eosinophils Absolute: 0.1 10*3/uL (ref 0.0–0.5)
Eosinophils Relative: 1 %
HCT: 36.7 % (ref 36.0–46.0)
Hemoglobin: 12.2 g/dL (ref 12.0–15.0)
Immature Granulocytes: 0 %
Lymphocytes Relative: 34 %
Lymphs Abs: 1.9 10*3/uL (ref 0.7–4.0)
MCH: 31.8 pg (ref 26.0–34.0)
MCHC: 33.2 g/dL (ref 30.0–36.0)
MCV: 95.6 fL (ref 80.0–100.0)
Monocytes Absolute: 0.5 10*3/uL (ref 0.1–1.0)
Monocytes Relative: 9 %
Neutro Abs: 3.1 10*3/uL (ref 1.7–7.7)
Neutrophils Relative %: 55 %
Platelet Count: 120 10*3/uL — ABNORMAL LOW (ref 150–400)
RBC: 3.84 MIL/uL — ABNORMAL LOW (ref 3.87–5.11)
RDW: 15.4 % (ref 11.5–15.5)
WBC Count: 5.7 10*3/uL (ref 4.0–10.5)
nRBC: 0 % (ref 0.0–0.2)

## 2021-06-08 LAB — CMP (CANCER CENTER ONLY)
ALT: 18 U/L (ref 0–44)
AST: 18 U/L (ref 15–41)
Albumin: 4.1 g/dL (ref 3.5–5.0)
Alkaline Phosphatase: 83 U/L (ref 38–126)
Anion gap: 8 (ref 5–15)
BUN: 18 mg/dL (ref 8–23)
CO2: 24 mmol/L (ref 22–32)
Calcium: 9.5 mg/dL (ref 8.9–10.3)
Chloride: 104 mmol/L (ref 98–111)
Creatinine: 0.79 mg/dL (ref 0.44–1.00)
GFR, Estimated: >60 mL/min (ref >60)
Glucose, Bld: 173 mg/dL — ABNORMAL HIGH (ref 70–99)
Potassium: 4.1 mmol/L (ref 3.5–5.1)
Sodium: 136 mmol/L (ref 135–145)
Total Bilirubin: 0.5 mg/dL (ref 0.3–1.2)
Total Protein: 6.9 g/dL (ref 6.5–8.1)

## 2021-06-08 MED ORDER — HEPARIN SOD (PORK) LOCK FLUSH 100 UNIT/ML IV SOLN
500.0000 [IU] | Freq: Once | INTRAVENOUS | Status: AC
  Administered 2021-06-08: 500 [IU] via INTRAVENOUS

## 2021-06-08 MED ORDER — SODIUM CHLORIDE 0.9% FLUSH
10.0000 mL | Freq: Once | INTRAVENOUS | Status: AC
  Administered 2021-06-08: 10 mL via INTRAVENOUS

## 2021-06-08 NOTE — Patient Instructions (Signed)

## 2021-06-08 NOTE — Progress Notes (Signed)
Patient seen by Dr. Benay Spice today ? ?Vitals are within treatment parameters. ? ?Labs reviewed by Dr. Benay Spice and are within treatment parameters. ? ?Per physician team, patient is ready for treatment and there are NO modifications to the treatment plan. ?OK to treat on 06/11/21  ?

## 2021-06-08 NOTE — Progress Notes (Signed)
?[facility] ?OFFICE PROGRESS NOTE ? ? ?Diagnosis: Pancreas cancer ? ?INTERVAL HISTORY:  ? ?[name] completed another cycle of gemcitabine/Abraxane on 05/28/2021.  Mild intermittent nausea.  She takes Compazine as needed.  No rash or fever.  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  She reports mild bleeding when she blows her nose. ? ?Objective: ? ?Vital signs in last 24 hours: ? ?Blood pressure 129/80, pulse 83, temperature 97.8 ?F (36.6 ?C), temperature source Oral, resp. rate 19, height '5\' 5"'$  (1.651 m), weight 153 lb 3.2 oz (69.5 kg), SpO2 100 %. ?  ? ?HEENT: No thrush or ulcers ?Resp: Lungs clear bilaterally ?Cardio: Regular rate and rhythm ?GI: No mass, no hepatosplenomegaly ?Vascular: No leg edema  ? ?Portacath/PICC-without erythema ? ?Lab Results: ? ?Lab Results  ?Component Value Date  ? WBC 5.7 06/08/2021  ? HGB 12.2 06/08/2021  ? HCT 36.7 06/08/2021  ? MCV 95.6 06/08/2021  ? PLT 120 (L) 06/08/2021  ? NEUTROABS 3.1 06/08/2021  ? ? ?CMP  ?Lab Results  ?Component Value Date  ? NA 136 05/25/2021  ? K 3.8 05/25/2021  ? CL 105 05/25/2021  ? CO2 22 05/25/2021  ? GLUCOSE 91 05/25/2021  ? BUN 13 05/25/2021  ? CREATININE 0.85 05/25/2021  ? CALCIUM 9.1 05/25/2021  ? PROT 6.7 05/25/2021  ? ALBUMIN 4.0 05/25/2021  ? AST 23 05/25/2021  ? ALT 21 05/25/2021  ? ALKPHOS 86 05/25/2021  ? BILITOT 0.4 05/25/2021  ? GFRNONAA >60 05/25/2021  ? GFRAA 71 02/02/2019  ? ? ?Lab Results  ?Component Value Date  ? HBZ169 3,309 (H) 05/25/2021  ? ? ? ? ?Medications: I have reviewed the patient's current medications. ? ? ?Assessment/Plan: ?Adenocarcinoma the pancreas body, stage IV (cT4,cN0,pM1) ?Ultrasound abdomen 08/18/2020-possible hypoechoic pancreas body mass, small hypoechoic liver areas ?MRI abdomen 08/27/2020-pancreas body mass, multiple hepatic lesions consistent with metastases, right abdominal omental nodularity, pancreas mass extends to the celiac bifurcation and abuts the splenic vein and splenoportal  confluence ?CT chest 09/07/2020-scattered pulmonary nodules concerning for metastases, pancreas body mass, hepatic metastases ?Ultrasound-guided biopsy of a right liver lesion 09/08/2020-adenocarcinoma consistent with a pancreas primary ?CT abdomen/pelvis 09/24/2020-pancreas neck mass, omental and peritoneal nodularity-unchanged, multiple hypoenhancing liver masses-unchanged, numerous small pulmonary nodules ?Cycle 1 gemcitabine/Abraxane 10/04/2020 ?Cycle 2 gemcitabine/Abraxane 10/27/2020 ?Cycle 3 gemcitabine/Abraxane 11/11/2018 ?Cycle 4 gemcitabine/Abraxane 11/23/2020 ?Cycle 5 gemcitabine/Abraxane 12/08/2020 ?CT abdomen/pelvis 12/18/2020-stable pancreas mass, stable and improved liver lesions, resolution of omental soft tissue density, stable indeterminate lung nodules, no disease progression ?Cycle 6 gemcitabine/Abraxane 12/22/2020 ?Cycle 7 gemcitabine 01/05/2021, 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  ?Cycle 8 gemcitabine/Abraxane 01/19/2021 ?Cycle 9 gemcitabine/Abraxane 02/01/2021 ?Cycle 10 Gemcitabine 02/16/2021, Abraxane held due to increased neuropathy ?Cycle 11 Gemcitabine 03/05/2021, 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 ?Cycle 12 gemcitabine 03/14/2021, 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 ?Cycle 13 gemcitabine 04/02/2021, 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 ?Cycle 14 gemcitabine/Abraxane 04/16/2021, Abraxane resumed at a reduced dose ?Cycle 15 gemcitabine/Abraxane 04/30/2021 ?CT abdomen/pelvis 05/08/2021-stable pancreas mass, liver metastases are slightly smaller, stable tiny bilateral lower lung nodules ?Cycle 16 gemcitabine/Abraxane 05/14/2021, Abraxane escalated back to 100 mg per metered squared ?Cycle 17 gemcitabine/Abraxane 05/28/2021 ?Cycle 18 gemcitabine/Abraxane 06/11/2021 ?  ?2.  Pain secondary #1 ?3.  Diabetes ?4.  Hypertension ?5.  Osteoarthritis ?6.  Port-A-Cath placement 09/22/2020 ?7.  Admission with jaundice 09/24/2020.  MRI-new abrupt constriction of the common hepatic duct.  The infiltrative pancreatic mass extends medially in the  porta hepatis to obstruct the common hepatic duct.  Multiple right hepatic lobe metastases mildly increased in  size.  Stent placed into the common bile duct 09/28/2020. ?  ? ? ? ? ?Disposition: ?[name] appears stable.  She completed another cycle of gemcitabine/Abraxane 05/28/2021.  She is scheduled for the next cycle on 06/11/2021. ?The CA 19-9 was mildly lower 2 weeks ago.  We will follow-up on the CA 19-9 from today.  [name] will return for an office visit in 2 weeks. ? ?Betsy Coder, MD ? ?06/08/2021  ?10:45 AM ? ? ?

## 2021-06-09 LAB — CANCER ANTIGEN 19-9: CA 19-9: 3164 U/mL — ABNORMAL HIGH (ref 0–35)

## 2021-06-10 ENCOUNTER — Other Ambulatory Visit: Payer: Self-pay | Admitting: Oncology

## 2021-06-11 ENCOUNTER — Encounter: Payer: Self-pay | Admitting: *Deleted

## 2021-06-11 ENCOUNTER — Inpatient Hospital Stay: Payer: Medicare HMO

## 2021-06-11 ENCOUNTER — Other Ambulatory Visit: Payer: Self-pay

## 2021-06-11 VITALS — BP 126/80 | HR 80 | Temp 98.4°F | Resp 20

## 2021-06-11 DIAGNOSIS — M1990 Unspecified osteoarthritis, unspecified site: Secondary | ICD-10-CM | POA: Diagnosis not present

## 2021-06-11 DIAGNOSIS — R110 Nausea: Secondary | ICD-10-CM | POA: Diagnosis not present

## 2021-06-11 DIAGNOSIS — E119 Type 2 diabetes mellitus without complications: Secondary | ICD-10-CM | POA: Diagnosis not present

## 2021-06-11 DIAGNOSIS — I10 Essential (primary) hypertension: Secondary | ICD-10-CM | POA: Diagnosis not present

## 2021-06-11 DIAGNOSIS — Z452 Encounter for adjustment and management of vascular access device: Secondary | ICD-10-CM | POA: Diagnosis not present

## 2021-06-11 DIAGNOSIS — C259 Malignant neoplasm of pancreas, unspecified: Secondary | ICD-10-CM

## 2021-06-11 DIAGNOSIS — Z5111 Encounter for antineoplastic chemotherapy: Secondary | ICD-10-CM | POA: Diagnosis not present

## 2021-06-11 DIAGNOSIS — G893 Neoplasm related pain (acute) (chronic): Secondary | ICD-10-CM | POA: Diagnosis not present

## 2021-06-11 DIAGNOSIS — R5383 Other fatigue: Secondary | ICD-10-CM | POA: Diagnosis not present

## 2021-06-11 MED ORDER — PROCHLORPERAZINE MALEATE 10 MG PO TABS
10.0000 mg | ORAL_TABLET | Freq: Once | ORAL | Status: AC
  Administered 2021-06-11: 10 mg via ORAL
  Filled 2021-06-11: qty 1

## 2021-06-11 MED ORDER — SODIUM CHLORIDE 0.9 % IV SOLN: INTRAVENOUS | Status: AC

## 2021-06-11 MED ORDER — SODIUM CHLORIDE 0.9 % IV SOLN: Freq: Once | INTRAVENOUS | Status: AC

## 2021-06-11 MED ORDER — HEPARIN SOD (PORK) LOCK FLUSH 100 UNIT/ML IV SOLN
500.0000 [IU] | Freq: Once | INTRAVENOUS | Status: AC | PRN
  Administered 2021-06-11: 500 [IU]

## 2021-06-11 MED ORDER — SODIUM CHLORIDE 0.9 % IV SOLN
800.0000 mg/m2 | Freq: Once | INTRAVENOUS | Status: AC
  Administered 2021-06-11: 1444 mg via INTRAVENOUS
  Filled 2021-06-11: qty 26.3

## 2021-06-11 MED ORDER — SODIUM CHLORIDE 0.9% FLUSH
10.0000 mL | INTRAVENOUS | Status: DC | PRN
  Administered 2021-06-11: 10 mL

## 2021-06-11 MED ORDER — PACLITAXEL PROTEIN-BOUND CHEMO INJECTION 100 MG
100.0000 mg/m2 | Freq: Once | INTRAVENOUS | Status: AC
  Administered 2021-06-11: 175 mg via INTRAVENOUS
  Filled 2021-06-11: qty 35

## 2021-06-11 NOTE — Progress Notes (Signed)
[name] brought in a signed ROI to allow [facility] to release medical information to the [facility] from [place] when it is requested. Forwarded form to be scanned. ?

## 2021-06-11 NOTE — Progress Notes (Signed)
Patient presents for treatment. RN assessment completed along with the following: ? ?Labs/vitals reviewed - Yes, and within treatment parameters.   ?Weight within 10% of previous measurement - Yes ?Informed consent completed and reflects current therapy/intent - Yes, on date 10/12/20             ?Provider progress note reviewed - Patient not seen by provider today. Most recent note dated 06/08/2021 reviewed. ?Treatment/Antibody/Supportive plan reviewed - Yes, and there are no adjustments needed for today's treatment. ?S&H and other orders reviewed - Yes, and there are no additional orders identified. ?Previous treatment date reviewed - Yes, and the appropriate amount of time has elapsed between treatments. ?Clinic Hand Off Received from - Cristy Friedlander, RN ? ? ?Patient to proceed with treatment.  ? ?

## 2021-06-11 NOTE — Patient Instructions (Signed)
[facility]   ?Discharge Instructions: ?Thank you for choosing [facility] to provide your oncology and hematology care.  ? ?If you have a lab appointment with the [facility], please go directly to the [facility] and check in at the registration area. ?  ?Wear comfortable clothing and clothing appropriate for easy access to any Portacath or PICC line.  ? ?We strive to give you quality time with your provider. You may need to reschedule your appointment if you arrive late (15 or more minutes).  Arriving late affects you and other patients whose appointments are after yours.  Also, if you miss three or more appointments without notifying the office, you may be dismissed from the clinic at the provider?s discretion.    ?  ?For prescription refill requests, have your pharmacy contact our office and allow 72 hours for refills to be completed.   ? ?Today you received the following chemotherapy and/or immunotherapy agents Abraxane, Gemzar    ?  ?To help prevent nausea and vomiting after your treatment, we encourage you to take your nausea medication as directed. ? ?BELOW ARE SYMPTOMS THAT SHOULD BE REPORTED IMMEDIATELY: ?*FEVER GREATER THAN 100.4 F (38 ?C) OR HIGHER ?*CHILLS OR SWEATING ?*NAUSEA AND VOMITING THAT IS NOT CONTROLLED WITH YOUR NAUSEA MEDICATION ?*UNUSUAL SHORTNESS OF BREATH ?*UNUSUAL BRUISING OR BLEEDING ?*URINARY PROBLEMS (pain or burning when urinating, or frequent urination) ?*BOWEL PROBLEMS (unusual diarrhea, constipation, pain near the anus) ?TENDERNESS IN MOUTH AND THROAT WITH OR WITHOUT PRESENCE OF ULCERS (sore throat, sores in mouth, or a toothache) ?UNUSUAL RASH, SWELLING OR PAIN  ?UNUSUAL VAGINAL DISCHARGE OR ITCHING  ? ?Items with * indicate a potential emergency and should be followed up as soon as possible or go to the Emergency Department if any problems should occur. ? ?Please show the CHEMOTHERAPY ALERT CARD or IMMUNOTHERAPY ALERT CARD at  check-in to the Emergency Department and triage nurse. ? ?Should you have questions after your visit or need to cancel or reschedule your appointment, please contact [facility]  Dept: [phone]  and follow the prompts.  Office hours are 8:00 a.m. to 4:30 p.m. Monday - Friday. Please note that voicemails left after 4:00 p.m. may not be returned until the following business day.  We are closed weekends and major holidays. You have access to a nurse at all times for urgent questions. Please call the main number to the clinic Dept: [phone] and follow the prompts. ? ? ?For any non-urgent questions, you may also contact your provider using MyChart. We now offer e-Visits for anyone 22 and older to request care online for non-urgent symptoms. For details visit [email or web address]. ?  ?Also download the MyChart app! Go to the app store, search "MyChart", open the app, select [facility], and log in with your MyChart username and password. ? ?Due to Covid, a mask is required upon entering the hospital/clinic. If you do not have a mask, one will be given to you upon arrival. For doctor visits, patients may have 1 support person aged 14 or older with them. For treatment visits, patients cannot have anyone with them due to current Covid guidelines and our immunocompromised population.  ? ?Nanoparticle Albumin-Bound Paclitaxel injection ?What is this medication? ?NANOPARTICLE ALBUMIN-BOUND PACLITAXEL (Na no PAHR ti kuhl  al BYOO muhn-bound  PAK li TAX el) is a chemotherapy drug. It targets fast dividing cells, like cancer cells, and causes these cells to die. This medicine is used  to treat advanced breast cancer, lung cancer, and pancreatic cancer. ?This medicine may be used for other purposes; ask your health care provider or pharmacist if you have questions. ?COMMON BRAND NAME(S): Abraxane ?What should I tell my care team before I take this medication? ?They need to know if you have any  of these conditions: ?kidney disease ?liver disease ?low blood counts, like low white cell, platelet, or red cell counts ?lung or breathing disease, like asthma ?tingling of the fingers or toes, or other nerve disorder ?an unusual or allergic reaction to paclitaxel, albumin, other chemotherapy, other medicines, foods, dyes, or preservatives ?pregnant or trying to get pregnant ?breast-feeding ?How should I use this medication? ?This drug is given as an infusion into a vein. It is administered in a hospital or clinic by a specially trained health care professional. ?Talk to your pediatrician regarding the use of this medicine in children. Special care may be needed. ?Overdosage: If you think you have taken too much of this medicine contact a poison control center or emergency room at once. ?NOTE: This medicine is only for you. Do not share this medicine with others. ?What if I miss a dose? ?It is important not to miss your dose. Call your doctor or health care professional if you are unable to keep an appointment. ?What may interact with this medication? ?This medicine may interact with the following medications: ?antiviral medicines for hepatitis, HIV or AIDS ?certain antibiotics like erythromycin and clarithromycin ?certain medicines for fungal infections like ketoconazole and itraconazole ?certain medicines for seizures like carbamazepine, phenobarbital, phenytoin ?gemfibrozil ?nefazodone ?rifampin ?St. John's wort ?This list may not describe all possible interactions. Give your health care provider a list of all the medicines, herbs, non-prescription drugs, or dietary supplements you use. Also tell them if you smoke, drink alcohol, or use illegal drugs. Some items may interact with your medicine. ?What should I watch for while using this medication? ?Your condition will be monitored carefully while you are receiving this medicine. You will need important blood work done while you are taking this medicine. ?This  medicine can cause serious allergic reactions. If you experience allergic reactions like skin rash, itching or hives, swelling of the face, lips, or tongue, tell your doctor or health care professional right away. ?In some cases, you may be given additional medicines to help with side effects. Follow all directions for their use. ?This drug may make you feel generally unwell. This is not uncommon, as chemotherapy can affect healthy cells as well as cancer cells. Report any side effects. Continue your course of treatment even though you feel ill unless your doctor tells you to stop. ?Call your doctor or health care professional for advice if you get a fever, chills or sore throat, or other symptoms of a cold or flu. Do not treat yourself. This drug decreases your body's ability to fight infections. Try to avoid being around people who are sick. ?This medicine may increase your risk to bruise or bleed. Call your doctor or health care professional if you notice any unusual bleeding. ?Be careful brushing and flossing your teeth or using a toothpick because you may get an infection or bleed more easily. If you have any dental work done, tell your dentist you are receiving this medicine. ?Avoid taking products that contain aspirin, acetaminophen, ibuprofen, naproxen, or ketoprofen unless instructed by your doctor. These medicines may hide a fever. ?Do not become pregnant while taking this medicine or for 6 months after stopping it. Women should  inform their doctor if they wish to become pregnant or think they might be pregnant. Men should not father a child while taking this medicine or for 3 months after stopping it. There is a potential for serious side effects to an unborn child. Talk to your health care professional or pharmacist for more information. ?Do not breast-feed an infant while taking this medicine or for 2 weeks after stopping it. ?This medicine may interfere with the ability to get pregnant or to father a  child. You should talk to your doctor or health care professional if you are concerned about your fertility. ?What side effects may I notice from receiving this medication? ?Side effects that you should report t

## 2021-06-19 ENCOUNTER — Telehealth: Payer: Self-pay

## 2021-06-19 ENCOUNTER — Institutional Professional Consult (permissible substitution): Payer: Medicare HMO | Admitting: Neurology

## 2021-06-19 NOTE — Telephone Encounter (Signed)
Follow up call to Pt from Tele Nurse call last night stating Pt had fever of 99.1 with no other symptoms. V/M message left for Pt to return call. Two attempts to Pt one attempt to Pt's daughter's phone. ?

## 2021-06-20 ENCOUNTER — Encounter (HOSPITAL_BASED_OUTPATIENT_CLINIC_OR_DEPARTMENT_OTHER): Payer: Self-pay | Admitting: Emergency Medicine

## 2021-06-20 ENCOUNTER — Other Ambulatory Visit: Payer: Self-pay

## 2021-06-20 ENCOUNTER — Emergency Department (HOSPITAL_BASED_OUTPATIENT_CLINIC_OR_DEPARTMENT_OTHER): Payer: Medicare HMO

## 2021-06-20 ENCOUNTER — Emergency Department (HOSPITAL_BASED_OUTPATIENT_CLINIC_OR_DEPARTMENT_OTHER)
Admission: EM | Admit: 2021-06-20 | Discharge: 2021-06-20 | Disposition: A | Discharge status: Home / Self Care | Payer: Medicare HMO | Attending: Emergency Medicine | Admitting: Emergency Medicine

## 2021-06-20 DIAGNOSIS — C259 Malignant neoplasm of pancreas, unspecified: Secondary | ICD-10-CM | POA: Diagnosis not present

## 2021-06-20 DIAGNOSIS — E871 Hypo-osmolality and hyponatremia: Secondary | ICD-10-CM | POA: Diagnosis not present

## 2021-06-20 DIAGNOSIS — R0789 Other chest pain: Secondary | ICD-10-CM | POA: Diagnosis not present

## 2021-06-20 DIAGNOSIS — U071 COVID-19: Secondary | ICD-10-CM | POA: Diagnosis not present

## 2021-06-20 DIAGNOSIS — R0981 Nasal congestion: Secondary | ICD-10-CM | POA: Diagnosis present

## 2021-06-20 DIAGNOSIS — R8289 Other abnormal findings on cytological and histological examination of urine: Secondary | ICD-10-CM | POA: Insufficient documentation

## 2021-06-20 DIAGNOSIS — Z79899 Other long term (current) drug therapy: Secondary | ICD-10-CM | POA: Insufficient documentation

## 2021-06-20 DIAGNOSIS — Z7982 Long term (current) use of aspirin: Secondary | ICD-10-CM | POA: Diagnosis not present

## 2021-06-20 DIAGNOSIS — R509 Fever, unspecified: Secondary | ICD-10-CM | POA: Diagnosis not present

## 2021-06-20 DIAGNOSIS — R300 Dysuria: Secondary | ICD-10-CM | POA: Insufficient documentation

## 2021-06-20 LAB — URINALYSIS, ROUTINE W REFLEX MICROSCOPIC
Bilirubin Urine: NEGATIVE
Glucose, UA: NEGATIVE mg/dL
Ketones, ur: NEGATIVE mg/dL
Nitrite: NEGATIVE
Protein, ur: NEGATIVE mg/dL
Specific Gravity, Urine: 1.011 (ref 1.005–1.030)
pH: 5.5 (ref 5.0–8.0)

## 2021-06-20 LAB — BASIC METABOLIC PANEL
Anion gap: 11 (ref 5–15)
BUN: 17 mg/dL (ref 8–23)
CO2: 19 mmol/L — ABNORMAL LOW (ref 22–32)
Calcium: 9.1 mg/dL (ref 8.9–10.3)
Chloride: 99 mmol/L (ref 98–111)
Creatinine, Ser: 0.84 mg/dL (ref 0.44–1.00)
GFR, Estimated: >60 mL/min (ref >60)
Glucose, Bld: 132 mg/dL — ABNORMAL HIGH (ref 70–99)
Potassium: 4.2 mmol/L (ref 3.5–5.1)
Sodium: 129 mmol/L — ABNORMAL LOW (ref 135–145)

## 2021-06-20 LAB — CBC
HCT: 37.6 % (ref 36.0–46.0)
Hemoglobin: 12.7 g/dL (ref 12.0–15.0)
MCH: 31.8 pg (ref 26.0–34.0)
MCHC: 33.8 g/dL (ref 30.0–36.0)
MCV: 94 fL (ref 80.0–100.0)
Platelets: 111 10*3/uL — ABNORMAL LOW (ref 150–400)
RBC: 4 MIL/uL (ref 3.87–5.11)
RDW: 15.3 % (ref 11.5–15.5)
WBC: 5.5 10*3/uL (ref 4.0–10.5)
nRBC: 0 % (ref 0.0–0.2)

## 2021-06-20 LAB — RESP PANEL BY RT-PCR (FLU A&B, COVID) ARPGX2
Influenza A by PCR: NEGATIVE
Influenza B by PCR: NEGATIVE
SARS Coronavirus 2 by RT PCR: POSITIVE — AB

## 2021-06-20 MED ORDER — ACETAMINOPHEN 325 MG PO TABS
650.0000 mg | ORAL_TABLET | Freq: Once | ORAL | Status: AC
Start: 2021-06-20 — End: 2021-06-20
  Administered 2021-06-20: 650 mg via ORAL
  Filled 2021-06-20: qty 2

## 2021-06-20 MED ORDER — SODIUM CHLORIDE 0.9 % IV BOLUS
1000.0000 mL | Freq: Once | INTRAVENOUS | Status: AC
  Administered 2021-06-20: 1000 mL via INTRAVENOUS

## 2021-06-20 NOTE — ED Provider Notes (Signed)
?[facility] EMERGENCY DEPT ?Provider Note ? ? ?CSN: [id number] ?Arrival date & time: 06/20/21  1919 ? ?  ? ?History ? ?Chief Complaint  ?Patient presents with  ? Dysuria  ? ? ?[name] is a 76 y.o. female. ? ?Patient presents chief complaint of having brown urine and some pain with urination today.  She also complaining of some nasal congestion, no cough.  Denies any back pain or abdominal pain.  No reports of fevers or vomiting or diarrhea.  She has a history of pancreatic cancer undergoing chemo therapy. ? ? ?  ? ?Home Medications ?Prior to Admission medications   ?Medication Sig Start Date End Date Taking? Authorizing Provider  ?ACCU-CHEK AVIVA PLUS test strip As needed 05/09/21   [name], MD  ?Accu-Chek Softclix Lancets lancets As needed 04/23/21   [name], MD  ?Alcohol Swabs PADS USE AS DIRECTED 05/18/21   [name], MD  ?ALPRAZolam Duanne Moron) 0.5 MG tablet Take 1 tablet (0.5 mg total) by mouth daily as needed for anxiety. 11/23/20   Owens Shark, NP  ?amLODipine (NORVASC) 5 MG tablet Take 5 mg by mouth daily. ?Patient not taking: Reported on 06/08/2021 03/30/21   [provider]  ?aspirin EC 81 MG tablet Take 81 mg by mouth every morning.    [provider]  ?Blood Glucose Monitoring Suppl (ACCU-CHEK GUIDE) w/Device KIT As needed 05/11/21   [name], MD  ?cyanocobalamin (,VITAMIN B-12,) 1000 MCG/ML injection Inject 1 mL (1,000 mcg total) into the muscle every 30 (thirty) days. 01/11/21   [name], MD  ?docusate sodium (COLACE) 100 MG capsule Take 100 mg by mouth 2 (two) times daily as needed (constipation).    [provider]  ?levothyroxine (SYNTHROID) 125 MCG tablet TAKE 1 TABLET ON AN EMPTY STOMACH IN THE MORNING 06/04/21   [name], MD  ?lidocaine-prilocaine (EMLA) cream APPLY 1 APPLICATION TOPICALLY AS NEEDED. 01/17/21   Ladell Pier, MD  ?omeprazole (PRILOSEC) 20 MG capsule TAKE 1 CAPSULE BY MOUTH EVERY DAY 04/18/21   [name], MD  ?ondansetron (ZOFRAN) 8 MG tablet Take 1 tablet (8 mg total) by mouth 2 (two) times daily as needed (Nausea or vomiting). 09/12/20   Truitt Merle, MD  ?prochlorperazine (COMPAZINE) 10 MG tablet Take 1 tablet (10 mg total) by mouth every 6 (six) hours as needed (Nausea or vomiting). 09/12/20   Truitt Merle, MD  ?   ? ?Allergies    ?Penicillins   ? ?Review of Systems   ?Review of Systems  ?Constitutional:  Negative for fever.  ?HENT:  Negative for ear pain.   ?Eyes:  Negative for pain.  ?Respiratory:  Negative for cough.   ?Cardiovascular:  Negative for chest pain.  ?Gastrointestinal:  Negative for abdominal pain.  ?Genitourinary:  Negative for flank pain.  ?Musculoskeletal:  Negative for back pain.  ?Skin:  Negative for rash.  ?Neurological:  Negative for headaches.  ? ?Physical Exam ?Updated Vital Signs ?BP 120/75   Pulse 82   Temp 98.3 ?F (36.8 ?C) (Oral)   Resp 18   Ht '5\' 5"'  (1.651 m)   Wt 69.4 kg   SpO2 96%   BMI 25.46 kg/m?  ?Physical Exam ?Constitutional:   ?   General: She is not in acute distress. ?   Appearance: Normal appearance.  ?HENT:  ?   Head: Normocephalic.  ?   Nose: Nose normal.  ?Eyes:  ?   Extraocular Movements: Extraocular movements  intact.  ?Cardiovascular:  ?   Rate and Rhythm: Normal rate.  ?Pulmonary:  ?   Effort: Pulmonary effort is normal.  ?Abdominal:  ?   Tenderness: There is no abdominal tenderness. There is no guarding or rebound.  ?Musculoskeletal:     ?   General: Normal range of motion.  ?   Cervical back: Normal range of motion.  ?Neurological:  ?   General: No focal deficit present.  ?   Mental Status: She is alert and oriented to person, place, and time. Mental status is at baseline.  ? ? ?ED Results / Procedures / Treatments   ?Labs ?(all labs ordered are listed, but only abnormal results are displayed) ?Labs Reviewed  ?RESP PANEL BY RT-PCR (FLU A&B, COVID) ARPGX2 - Abnormal; Notable for the following components:  ?    Result Value  ? SARS Coronavirus 2 by RT PCR  POSITIVE (*)   ? All other components within normal limits  ?URINALYSIS, ROUTINE W REFLEX MICROSCOPIC - Abnormal; Notable for the following components:  ? Hgb urine dipstick MODERATE (*)   ? Leukocytes,Ua TRACE (*)   ? All other components within normal limits  ?BASIC METABOLIC PANEL - Abnormal; Notable for the following components:  ? Sodium 129 (*)   ? CO2 19 (*)   ? Glucose, Bld 132 (*)   ? All other components within normal limits  ?CBC - Abnormal; Notable for the following components:  ? Platelets 111 (*)   ? All other components within normal limits  ?URINE CULTURE  ?CULTURE, BLOOD (ROUTINE X 2)  ?CULTURE, BLOOD (ROUTINE X 2)  ? ? ?EKG ?None ? ?Radiology ?DG Chest Port 1 View ? ?Result Date: 06/20/2021 ?CLINICAL DATA:  Fever and chest discomfort. EXAM: PORTABLE CHEST 1 VIEW COMPARISON:  Chest radiograph 10/14/2020 FINDINGS: A right jugular Port-A-Cath remains in place with tip projecting over the lower SVC. The cardiomediastinal silhouette is unchanged with normal heart size. Aortic atherosclerosis is noted. No airspace consolidation, edema, pleural effusion, or pneumothorax is identified. No acute osseous abnormality is seen. IMPRESSION: No active disease. Electronically Signed   By: Logan Bores M.D.   On: 06/20/2021 20:39   ? ?Procedures ?Procedures  ? ? ?Medications Ordered in ED ?Medications  ?sodium chloride 0.9 % bolus 1,000 mL (0 mLs Intravenous Stopped 06/20/21 2154)  ?acetaminophen (TYLENOL) tablet 650 mg (650 mg Oral Given 06/20/21 2049)  ? ? ?ED Course/ Medical Decision Making/ A&P ?  ?                        ?Medical Decision Making ?Amount and/or Complexity of Data Reviewed ?Labs: ordered. ?Radiology: ordered. ? ?Risk ?OTC drugs. ? ? ?Chart review shows infusion for metastatic cancer done June 11, 2021. ? ?Cardiac monitoring showing sinus rhythm. ? ?Work-up includes lab work.  Sodium slightly low at 129 bicarb of 19.  Given a liter bolus of fluid resuscitation. ? ?White count otherwise normal.   Urinalysis shows trace leukocytes. ? ?COVID test however sent and positive for COVID-19.  Blood cultures have been sent and pending. ? ?Vital signs otherwise remained stable.  Patient advised close outpatient follow-up with her doctor this week.  Recommending immediate return for worsening symptoms inability keep down any fluids or any additional concerns. ? ? ? ? ? ? ? ? ? ?Final Clinical Impression(s) / ED Diagnoses ?Final diagnoses:  ?COVID-19  ? ? ?Rx / DC Orders ?ED Discharge Orders   ? ? None  ? ?  ? ? ?  ?  Luna Fuse, MD ?06/20/21 2235 ? ?

## 2021-06-20 NOTE — ED Triage Notes (Signed)
Pt presents from home for dark urine (brown starting today), dysuria. ? ?Would also like to be checked out for nasal congestion. ? ?Denies abd, back pain, fever, headache, emesis. ? ?Has not tried any OTC meds. Currently undergoing chemo, last tx  2 weeks ago.  ? ? ?

## 2021-06-20 NOTE — ED Notes (Signed)
MD at the Bedside. 

## 2021-06-20 NOTE — ED Notes (Signed)
RN provided AVS using Teachback Method. Patient verbalizes understanding of Discharge Instructions. Opportunity for Questioning and Answers were provided by RN. Patient Discharged from ED in wheelchair to Home. 

## 2021-06-20 NOTE — Discharge Instructions (Addendum)
Call your primary care doctor or specialist as discussed in the next 2-3 days.   Return immediately back to the ER if:  Your symptoms worsen within the next 12-24 hours. You develop new symptoms such as new fevers, persistent vomiting, new pain, shortness of breath, or new weakness or numbness, or if you have any other concerns.  

## 2021-06-21 ENCOUNTER — Telehealth: Payer: Self-pay

## 2021-06-21 ENCOUNTER — Other Ambulatory Visit: Payer: Self-pay

## 2021-06-21 ENCOUNTER — Ambulatory Visit: Payer: Medicare HMO

## 2021-06-21 LAB — BILIRUBIN, TOTAL: Total Bilirubin: 0.5 mg/dL (ref 0.3–1.2)

## 2021-06-21 NOTE — Telephone Encounter (Signed)
Patient call in after hours twice about dark urine. Patient states she have Covid, her urine is brown, no fever and she went to ER. Patient most concerned is about dark brown urine and might going to need a stent. Advice the patient I will discuss these issues with the MD and if the problems get worsen to call back. ?

## 2021-06-21 NOTE — Telephone Encounter (Signed)
TC from Pt to see if message was relayed to Dr Benay Spice informed Pt message was relayed to Dr Benay Spice and he ordered another blood test (bilirubin) to her labs from her ER visit last evening. Bilirubin results are normal informed Pt per Dr Benay Spice her labs did show she had some blood in her urine could be caused by an infection. We will wait to see what the urine culture results come back. ?

## 2021-06-22 ENCOUNTER — Inpatient Hospital Stay: Payer: Medicare HMO

## 2021-06-22 ENCOUNTER — Inpatient Hospital Stay: Payer: Medicare HMO | Admitting: Nurse Practitioner

## 2021-06-22 LAB — URINE CULTURE: Culture: NO GROWTH

## 2021-06-25 ENCOUNTER — Inpatient Hospital Stay: Payer: Medicare HMO

## 2021-06-25 ENCOUNTER — Other Ambulatory Visit (HOSPITAL_BASED_OUTPATIENT_CLINIC_OR_DEPARTMENT_OTHER): Payer: Self-pay

## 2021-06-25 ENCOUNTER — Encounter: Payer: Self-pay | Admitting: Family Medicine

## 2021-06-25 ENCOUNTER — Other Ambulatory Visit: Payer: Self-pay

## 2021-06-25 ENCOUNTER — Ambulatory Visit (INDEPENDENT_AMBULATORY_CARE_PROVIDER_SITE_OTHER): Payer: Medicare HMO | Admitting: Family Medicine

## 2021-06-25 ENCOUNTER — Telehealth: Payer: Self-pay | Admitting: Family Medicine

## 2021-06-25 ENCOUNTER — Other Ambulatory Visit: Payer: Self-pay | Admitting: *Deleted

## 2021-06-25 VITALS — BP 146/83 | HR 94 | Temp 97.7°F | Ht 65.0 in | Wt 151.8 lb

## 2021-06-25 DIAGNOSIS — U071 COVID-19: Secondary | ICD-10-CM | POA: Diagnosis not present

## 2021-06-25 DIAGNOSIS — R82998 Other abnormal findings in urine: Secondary | ICD-10-CM

## 2021-06-25 DIAGNOSIS — C259 Malignant neoplasm of pancreas, unspecified: Secondary | ICD-10-CM

## 2021-06-25 DIAGNOSIS — E538 Deficiency of other specified B group vitamins: Secondary | ICD-10-CM | POA: Diagnosis not present

## 2021-06-25 MED ORDER — CYANOCOBALAMIN 1000 MCG/ML IJ SOLN
1000.0000 ug | Freq: Once | INTRAMUSCULAR | Status: AC
  Administered 2021-06-25: 1000 ug via INTRAMUSCULAR

## 2021-06-25 NOTE — Telephone Encounter (Signed)
Patient scheduled for today

## 2021-06-25 NOTE — Patient Instructions (Signed)
It was very nice to see you today! ? ?I am glad that you are feeling a little bit better.  We will check blood work and urine sample to see why you have blood in your urine. ? ?We may need to get a CT scan or refer you to see a urologist depending on the results. ? ?Take care, ?Dr Jerline Pain ? ?PLEASE NOTE: ? ?If you had any lab tests please let us know if you have not heard back within a few days. You may see your results on mychart before we have a chance to review them but we will give you a call once they are reviewed by [place]. If we ordered any referrals today, please let us know if you have not heard from their office within the next week.  ? ?Please try these tips to maintain a healthy lifestyle: ? ?Eat at least 3 REAL meals and 1-2 snacks per day.  Aim for no more than 5 hours between eating.  If you eat breakfast, please do so within one hour of getting up.  ? ?Each meal should contain half fruits/vegetables, one quarter protein, and one quarter carbs (no bigger than a computer mouse) ? ?Cut down on sweet beverages. This includes juice, soda, and sweet tea.  ? ?Drink at least 1 glass of water with each meal and aim for at least 8 glasses per day ? ?Exercise at least 150 minutes every week.   ?

## 2021-06-25 NOTE — Progress Notes (Signed)
? ?  [name] is a 76 y.o. female who presents today for an office visit. ? ?Assessment/Plan:  ?New/Acute Problems: ?Hematuria ?Unclear etiology.  May be related to her pancreatic cancer or chemotherapy.  She is not having the pain-doubt nephrolithiasis though still possible.  We will recheck UA with microscopy.  We will also check CBC and c-Met.  If she has persistent hematuria she will need imaging or referral to urology. ? ?COVID ?No symptoms.  Was relatively asymptomatic when tested a few days ago.  Do not think this will cause any issues going forward.  She will let us know if she develops any new symptoms. ? ?Chronic Problems Addressed Today: ?B12 Deficiency -injection given today. ? ? ?  ?Subjective:  ?HPI: ? ?Patient is here for ED follow-up.  Went to the ED on 06/20/2021 with dark brown urine and pain with urination as well as nasal congestion.  She was given a liter of IV fluids.  COVID test was positive.  She was discharged home. ? ?Over the last couple days her urine seems to be getting lighter in color.  She is not having any fevers or chills.  She still feels very weak.  She is currently undergoing chemotherapy for pancreatic cancer which has been causing her a lot of issues.  No reported dysuria.  No reported fevers or chills.  No flank pain.  No abdominal pain. ? ? ?   ?  ?Objective:  ?Physical Exam: ?BP (!) 146/83 (BP Location: Left Arm)   Pulse 94   Temp 97.7 ?F (36.5 ?C) (Temporal)   Ht _0  (1.651 m)   Wt 151 lb 12.8 oz (68.9 kg)   SpO2 98%   BMI 25.26 kg/m?   ?Gen: No acute distress, resting comfortably ?CV: Regular rate and rhythm with no murmurs appreciated ?Pulm: Normal work of breathing, clear to auscultation bilaterally with no crackles, wheezes, or rhonchi ?Neuro: Grossly normal, moves all extremities ?Psych: Normal affect and thought content ? ?Time Spent: ?45 minutes of total time was spent on the date of the encounter performing the following actions: chart review prior to  seeing the patient including recent ED visit, obtaining history, performing a medically necessary exam, counseling on the treatment plan, placing orders, and documenting in our EHR.  ? ? ?   ? ?[name]. Jerline Pain, MD ?06/25/2021 2:30 PM  ?

## 2021-06-25 NOTE — Telephone Encounter (Signed)
Patient need OV with PCP for ED F/U  ?

## 2021-06-25 NOTE — Telephone Encounter (Signed)
Pt was seen in ED on 06/20/21 for Covid. She has had labs done including urinalysis. Pt stated she told them she had blood in the urine at that time also. She was given no meds and is concerned with her results. She does not know how to do virtual. Please advise. ?

## 2021-06-26 ENCOUNTER — Other Ambulatory Visit (HOSPITAL_BASED_OUTPATIENT_CLINIC_OR_DEPARTMENT_OTHER): Payer: Self-pay

## 2021-06-26 ENCOUNTER — Inpatient Hospital Stay: Payer: Medicare HMO | Attending: Hematology

## 2021-06-26 ENCOUNTER — Encounter: Payer: Self-pay | Admitting: Oncology

## 2021-06-26 VITALS — BP 126/76 | HR 83 | Temp 98.4°F | Resp 18 | Ht 65.0 in | Wt 148.0 lb

## 2021-06-26 DIAGNOSIS — Z5111 Encounter for antineoplastic chemotherapy: Secondary | ICD-10-CM | POA: Insufficient documentation

## 2021-06-26 DIAGNOSIS — C259 Malignant neoplasm of pancreas, unspecified: Secondary | ICD-10-CM | POA: Insufficient documentation

## 2021-06-26 DIAGNOSIS — E119 Type 2 diabetes mellitus without complications: Secondary | ICD-10-CM | POA: Diagnosis not present

## 2021-06-26 DIAGNOSIS — G893 Neoplasm related pain (acute) (chronic): Secondary | ICD-10-CM | POA: Diagnosis not present

## 2021-06-26 DIAGNOSIS — M1990 Unspecified osteoarthritis, unspecified site: Secondary | ICD-10-CM | POA: Insufficient documentation

## 2021-06-26 DIAGNOSIS — I10 Essential (primary) hypertension: Secondary | ICD-10-CM | POA: Diagnosis not present

## 2021-06-26 DIAGNOSIS — Z8616 Personal history of COVID-19: Secondary | ICD-10-CM | POA: Insufficient documentation

## 2021-06-26 DIAGNOSIS — Z95828 Presence of other vascular implants and grafts: Secondary | ICD-10-CM

## 2021-06-26 LAB — URINALYSIS, ROUTINE W REFLEX MICROSCOPIC
Bilirubin Urine: NEGATIVE
Hgb urine dipstick: NEGATIVE
Ketones, ur: NEGATIVE
Nitrite: NEGATIVE
RBC / HPF: NONE SEEN (ref 0–?)
Specific Gravity, Urine: <=1.005 — AB (ref 1.000–1.030)
Total Protein, Urine: NEGATIVE
Urine Glucose: NEGATIVE
Urobilinogen, UA: 0.2 (ref 0.0–1.0)
pH: 6 (ref 5.0–8.0)

## 2021-06-26 LAB — COMPREHENSIVE METABOLIC PANEL
ALT: 22 U/L (ref 0–35)
AST: 28 U/L (ref 0–37)
Albumin: 4.1 g/dL (ref 3.5–5.2)
Alkaline Phosphatase: 87 U/L (ref 39–117)
BUN: 11 mg/dL (ref 6–23)
CO2: 22 mEq/L (ref 19–32)
Calcium: 9.2 mg/dL (ref 8.4–10.5)
Chloride: 103 mEq/L (ref 96–112)
Creatinine, Ser: 0.73 mg/dL (ref 0.40–1.20)
GFR: 78.92 mL/min (ref >60.00)
Glucose, Bld: 148 mg/dL — ABNORMAL HIGH (ref 70–99)
Potassium: 3.8 mEq/L (ref 3.5–5.1)
Sodium: 135 mEq/L (ref 135–145)
Total Bilirubin: 0.5 mg/dL (ref 0.2–1.2)
Total Protein: 6.8 g/dL (ref 6.0–8.3)

## 2021-06-26 LAB — CULTURE, BLOOD (ROUTINE X 2)
Culture: NO GROWTH
Culture: NO GROWTH
Special Requests: ADEQUATE
Special Requests: ADEQUATE

## 2021-06-26 LAB — CBC
HCT: 38.4 % (ref 36.0–46.0)
Hemoglobin: 13.1 g/dL (ref 12.0–15.0)
MCHC: 34.2 g/dL (ref 30.0–36.0)
MCV: 94.6 fl (ref 78.0–100.0)
Platelets: 113 10*3/uL — ABNORMAL LOW (ref 150.0–400.0)
RBC: 4.06 Mil/uL (ref 3.87–5.11)
RDW: 16.2 % — ABNORMAL HIGH (ref 11.5–15.5)
WBC: 5.1 10*3/uL (ref 4.0–10.5)

## 2021-06-26 MED ORDER — HEPARIN SOD (PORK) LOCK FLUSH 100 UNIT/ML IV SOLN
500.0000 [IU] | Freq: Once | INTRAVENOUS | Status: AC
  Administered 2021-06-26: 500 [IU] via INTRAVENOUS

## 2021-06-26 MED ORDER — SODIUM CHLORIDE 0.9% FLUSH
10.0000 mL | Freq: Once | INTRAVENOUS | Status: AC
  Administered 2021-06-26: 10 mL via INTRAVENOUS

## 2021-06-26 MED ORDER — SODIUM CHLORIDE 0.9 % IV SOLN: INTRAVENOUS | Status: DC

## 2021-06-26 NOTE — Patient Instructions (Signed)

## 2021-06-27 ENCOUNTER — Telehealth: Payer: Self-pay | Admitting: Family Medicine

## 2021-06-27 NOTE — Telephone Encounter (Signed)
I spoke with the pt to address concerns below. I advised her that as soon as Dr. Jerline Pain is able to review the results, someone from our office will give her a call. She voiced understanding and will wait for call.  ?

## 2021-06-27 NOTE — Progress Notes (Signed)
Please inform patient of the following: ? ?She no longer has any blood in urine.  We should repeat this in 6 weeks.  She can follow-up with her PCP to have this done.  She should let us know if she has any recurrence of symptoms before then.

## 2021-06-27 NOTE — Telephone Encounter (Signed)
Patient stated she cannot understand lab results. Patient was advised lab was drawn on 06/25/21 and it takes some time for the doctor to review and transmit result to patient - patient was very upset as she wants results right away and she doesn't understand results in mychart.  ? ? ?I assured patient Dr Jerline Pain will provide her with results via mychart - In words not in numbers.- She also wants a phone call from [place] with results.  ? ? ?

## 2021-06-28 ENCOUNTER — Telehealth: Payer: Self-pay

## 2021-06-28 ENCOUNTER — Inpatient Hospital Stay: Payer: Medicare HMO | Admitting: Nutrition

## 2021-06-28 ENCOUNTER — Telehealth: Payer: Self-pay | Admitting: Nutrition

## 2021-06-28 NOTE — Telephone Encounter (Signed)
I received a telephone call from patient's daughter [name] regarding patient's lack of taste and recommendations for oral nutrition supplements.  I returned call and left message.  Encouraged her to allow patient to eat and drink the best she can.  Suggested Ensure plus or equivalent.  I do not think she needs to drink Glucerna at this time.  Suggested baking soda and salt water rinses.  Left name and number for return call if needed. ?

## 2021-06-28 NOTE — Telephone Encounter (Signed)
TC from Pt's daughter inquiring about Pt.Informed Pt's daughter that Pt was here yesterday and she received her fluids and she is stable. Informed Pt's daughter that Pt is feeling weak from Covid symptoms. Pt's daughter concerned about Pt's appetite. Informed her that Pt is still recovering from Covid and to try giving her soup and Gatorade. Informed Pt's daughter verbalized understanding. ?

## 2021-06-29 ENCOUNTER — Telehealth: Payer: Self-pay | Admitting: Family Medicine

## 2021-06-29 NOTE — Telephone Encounter (Signed)
Patients daughter has called back in regard.  I have given her Dr. Marigene Ehlers response and have scheduled patient's follow up visit.  Daughter is requesting office to follow back up with patient in regard on Monday.

## 2021-06-29 NOTE — Telephone Encounter (Signed)
Pt's daughter was calling in regards to pt's lab results. Please call today if possible. ?

## 2021-06-30 ENCOUNTER — Emergency Department (HOSPITAL_COMMUNITY): Payer: Medicare HMO

## 2021-06-30 ENCOUNTER — Observation Stay (HOSPITAL_COMMUNITY)
Admission: EM | Admit: 2021-06-30 | Discharge: 2021-07-02 | Disposition: A | Discharge status: Home / Self Care | Payer: Medicare HMO | Attending: Internal Medicine | Admitting: Internal Medicine

## 2021-06-30 ENCOUNTER — Encounter (HOSPITAL_COMMUNITY): Payer: Self-pay | Admitting: Emergency Medicine

## 2021-06-30 DIAGNOSIS — U071 COVID-19: Principal | ICD-10-CM | POA: Insufficient documentation

## 2021-06-30 DIAGNOSIS — E1165 Type 2 diabetes mellitus with hyperglycemia: Secondary | ICD-10-CM | POA: Insufficient documentation

## 2021-06-30 DIAGNOSIS — R531 Weakness: Secondary | ICD-10-CM | POA: Insufficient documentation

## 2021-06-30 DIAGNOSIS — D72829 Elevated white blood cell count, unspecified: Secondary | ICD-10-CM | POA: Diagnosis not present

## 2021-06-30 DIAGNOSIS — Z7901 Long term (current) use of anticoagulants: Secondary | ICD-10-CM | POA: Diagnosis not present

## 2021-06-30 DIAGNOSIS — R0600 Dyspnea, unspecified: Secondary | ICD-10-CM | POA: Diagnosis not present

## 2021-06-30 DIAGNOSIS — C787 Secondary malignant neoplasm of liver and intrahepatic bile duct: Secondary | ICD-10-CM | POA: Insufficient documentation

## 2021-06-30 DIAGNOSIS — L89891 Pressure ulcer of other site, stage 1: Secondary | ICD-10-CM | POA: Diagnosis not present

## 2021-06-30 DIAGNOSIS — J984 Other disorders of lung: Secondary | ICD-10-CM | POA: Diagnosis not present

## 2021-06-30 DIAGNOSIS — Z79890 Hormone replacement therapy (postmenopausal): Secondary | ICD-10-CM | POA: Diagnosis not present

## 2021-06-30 DIAGNOSIS — Z8249 Family history of ischemic heart disease and other diseases of the circulatory system: Secondary | ICD-10-CM | POA: Diagnosis not present

## 2021-06-30 DIAGNOSIS — F339 Major depressive disorder, recurrent, unspecified: Secondary | ICD-10-CM | POA: Insufficient documentation

## 2021-06-30 DIAGNOSIS — Z79899 Other long term (current) drug therapy: Secondary | ICD-10-CM | POA: Insufficient documentation

## 2021-06-30 DIAGNOSIS — C259 Malignant neoplasm of pancreas, unspecified: Secondary | ICD-10-CM | POA: Insufficient documentation

## 2021-06-30 DIAGNOSIS — R7401 Elevation of levels of liver transaminase levels: Secondary | ICD-10-CM | POA: Diagnosis not present

## 2021-06-30 DIAGNOSIS — R918 Other nonspecific abnormal finding of lung field: Secondary | ICD-10-CM | POA: Diagnosis not present

## 2021-06-30 DIAGNOSIS — F411 Generalized anxiety disorder: Secondary | ICD-10-CM | POA: Diagnosis not present

## 2021-06-30 DIAGNOSIS — R7989 Other specified abnormal findings of blood chemistry: Secondary | ICD-10-CM | POA: Insufficient documentation

## 2021-06-30 DIAGNOSIS — M6281 Muscle weakness (generalized): Secondary | ICD-10-CM | POA: Diagnosis not present

## 2021-06-30 DIAGNOSIS — R2681 Unsteadiness on feet: Secondary | ICD-10-CM | POA: Insufficient documentation

## 2021-06-30 DIAGNOSIS — E782 Mixed hyperlipidemia: Secondary | ICD-10-CM | POA: Insufficient documentation

## 2021-06-30 DIAGNOSIS — R0609 Other forms of dyspnea: Secondary | ICD-10-CM | POA: Diagnosis not present

## 2021-06-30 DIAGNOSIS — Z8507 Personal history of malignant neoplasm of pancreas: Secondary | ICD-10-CM | POA: Diagnosis not present

## 2021-06-30 DIAGNOSIS — Z833 Family history of diabetes mellitus: Secondary | ICD-10-CM | POA: Insufficient documentation

## 2021-06-30 DIAGNOSIS — R748 Abnormal levels of other serum enzymes: Secondary | ICD-10-CM | POA: Diagnosis not present

## 2021-06-30 DIAGNOSIS — R2689 Other abnormalities of gait and mobility: Secondary | ICD-10-CM | POA: Diagnosis not present

## 2021-06-30 DIAGNOSIS — L8990 Pressure ulcer of unspecified site, unspecified stage: Secondary | ICD-10-CM | POA: Insufficient documentation

## 2021-06-30 DIAGNOSIS — E039 Hypothyroidism, unspecified: Secondary | ICD-10-CM | POA: Diagnosis not present

## 2021-06-30 DIAGNOSIS — R000 Tachycardia, unspecified: Secondary | ICD-10-CM | POA: Diagnosis not present

## 2021-06-30 DIAGNOSIS — Z7982 Long term (current) use of aspirin: Secondary | ICD-10-CM | POA: Insufficient documentation

## 2021-06-30 DIAGNOSIS — Z8673 Personal history of transient ischemic attack (TIA), and cerebral infarction without residual deficits: Secondary | ICD-10-CM | POA: Insufficient documentation

## 2021-06-30 DIAGNOSIS — I10 Essential (primary) hypertension: Secondary | ICD-10-CM | POA: Insufficient documentation

## 2021-06-30 DIAGNOSIS — R9431 Abnormal electrocardiogram [ECG] [EKG]: Secondary | ICD-10-CM | POA: Insufficient documentation

## 2021-06-30 DIAGNOSIS — J1282 Pneumonia due to coronavirus disease 2019: Secondary | ICD-10-CM | POA: Diagnosis not present

## 2021-06-30 DIAGNOSIS — E119 Type 2 diabetes mellitus without complications: Secondary | ICD-10-CM

## 2021-06-30 DIAGNOSIS — Z794 Long term (current) use of insulin: Secondary | ICD-10-CM | POA: Insufficient documentation

## 2021-06-30 LAB — CBC WITH DIFFERENTIAL/PLATELET
Abs Immature Granulocytes: 0.08 10*3/uL — ABNORMAL HIGH (ref 0.00–0.07)
Basophils Absolute: 0 10*3/uL (ref 0.0–0.1)
Basophils Relative: 0 %
Eosinophils Absolute: 0 10*3/uL (ref 0.0–0.5)
Eosinophils Relative: 0 %
HCT: 40.1 % (ref 36.0–46.0)
Hemoglobin: 13.4 g/dL (ref 12.0–15.0)
Immature Granulocytes: 1 %
Lymphocytes Relative: 7 %
Lymphs Abs: 0.8 10*3/uL (ref 0.7–4.0)
MCH: 31.6 pg (ref 26.0–34.0)
MCHC: 33.4 g/dL (ref 30.0–36.0)
MCV: 94.6 fL (ref 80.0–100.0)
Monocytes Absolute: 1 10*3/uL (ref 0.1–1.0)
Monocytes Relative: 9 %
Neutro Abs: 9.6 10*3/uL — ABNORMAL HIGH (ref 1.7–7.7)
Neutrophils Relative %: 83 %
Platelets: 200 10*3/uL (ref 150–400)
RBC: 4.24 MIL/uL (ref 3.87–5.11)
RDW: 15 % (ref 11.5–15.5)
WBC: 11.5 10*3/uL — ABNORMAL HIGH (ref 4.0–10.5)
nRBC: 0 % (ref 0.0–0.2)

## 2021-06-30 LAB — COMPREHENSIVE METABOLIC PANEL
ALT: 137 U/L — ABNORMAL HIGH (ref 0–44)
AST: 186 U/L — ABNORMAL HIGH (ref 15–41)
Albumin: 3.5 g/dL (ref 3.5–5.0)
Alkaline Phosphatase: 197 U/L — ABNORMAL HIGH (ref 38–126)
Anion gap: 8 (ref 5–15)
BUN: 9 mg/dL (ref 8–23)
CO2: 18 mmol/L — ABNORMAL LOW (ref 22–32)
Calcium: 8.8 mg/dL — ABNORMAL LOW (ref 8.9–10.3)
Chloride: 109 mmol/L (ref 98–111)
Creatinine, Ser: 0.68 mg/dL (ref 0.44–1.00)
GFR, Estimated: >60 mL/min (ref >60)
Glucose, Bld: 157 mg/dL — ABNORMAL HIGH (ref 70–99)
Potassium: 4 mmol/L (ref 3.5–5.1)
Sodium: 135 mmol/L (ref 135–145)
Total Bilirubin: 1.1 mg/dL (ref 0.3–1.2)
Total Protein: 6.6 g/dL (ref 6.5–8.1)

## 2021-06-30 LAB — TSH: TSH: 0.482 u[IU]/mL (ref 0.350–4.500)

## 2021-06-30 LAB — URINALYSIS, ROUTINE W REFLEX MICROSCOPIC
Bacteria, UA: NONE SEEN
Bilirubin Urine: NEGATIVE
Glucose, UA: 50 mg/dL — AB
Hgb urine dipstick: NEGATIVE
Ketones, ur: NEGATIVE mg/dL
Nitrite: NEGATIVE
Protein, ur: NEGATIVE mg/dL
Specific Gravity, Urine: 1.013 (ref 1.005–1.030)
pH: 6 (ref 5.0–8.0)

## 2021-06-30 LAB — LACTIC ACID, PLASMA
Lactic Acid, Venous: 1.2 mmol/L (ref 0.5–1.9)
Lactic Acid, Venous: 1.4 mmol/L (ref 0.5–1.9)

## 2021-06-30 LAB — C-REACTIVE PROTEIN: CRP: 3.4 mg/dL — ABNORMAL HIGH (ref <1.0)

## 2021-06-30 LAB — D-DIMER, QUANTITATIVE: D-Dimer, Quant: 1.33 ug/mL-FEU — ABNORMAL HIGH (ref 0.00–0.50)

## 2021-06-30 LAB — PROTIME-INR
INR: 1.1 (ref 0.8–1.2)
Prothrombin Time: 14.5 seconds (ref 11.4–15.2)

## 2021-06-30 LAB — LACTATE DEHYDROGENASE: LDH: 206 U/L — ABNORMAL HIGH (ref 98–192)

## 2021-06-30 LAB — GLUCOSE, CAPILLARY
Glucose-Capillary: 203 mg/dL — ABNORMAL HIGH (ref 70–99)
Glucose-Capillary: 145 mg/dL — ABNORMAL HIGH (ref 70–99)

## 2021-06-30 LAB — FERRITIN: Ferritin: 1395 ng/mL — ABNORMAL HIGH (ref 11–307)

## 2021-06-30 LAB — LIPASE, BLOOD: Lipase: 33 U/L (ref 11–51)

## 2021-06-30 LAB — BRAIN NATRIURETIC PEPTIDE: B Natriuretic Peptide: 98.5 pg/mL (ref 0.0–100.0)

## 2021-06-30 LAB — RESP PANEL BY RT-PCR (FLU A&B, COVID) ARPGX2
Influenza A by PCR: NEGATIVE
Influenza B by PCR: NEGATIVE
SARS Coronavirus 2 by RT PCR: POSITIVE — AB

## 2021-06-30 LAB — TROPONIN I (HIGH SENSITIVITY)
Troponin I (High Sensitivity): 12 ng/L (ref <18)
Troponin I (High Sensitivity): 11 ng/L (ref <18)

## 2021-06-30 LAB — FIBRINOGEN: Fibrinogen: 448 mg/dL (ref 210–475)

## 2021-06-30 LAB — VITAMIN B12: Vitamin B-12: 1783 pg/mL — ABNORMAL HIGH (ref 180–914)

## 2021-06-30 MED ORDER — FLUTICASONE PROPIONATE 50 MCG/ACT NA SUSP
1.0000 | Freq: Two times a day (BID) | NASAL | Status: DC
  Administered 2021-06-30 – 2021-07-01 (×4): 1 via NASAL
  Filled 2021-06-30: qty 16

## 2021-06-30 MED ORDER — ALPRAZOLAM 0.5 MG PO TABS
0.5000 mg | ORAL_TABLET | Freq: Every day | ORAL | Status: DC | PRN
  Administered 2021-07-02: 0.5 mg via ORAL
  Filled 2021-06-30: qty 1

## 2021-06-30 MED ORDER — INSULIN ASPART 100 UNIT/ML IJ SOLN
0.0000 [IU] | Freq: Three times a day (TID) | INTRAMUSCULAR | Status: DC
  Administered 2021-07-02: 1 [IU] via SUBCUTANEOUS

## 2021-06-30 MED ORDER — ACETAMINOPHEN 325 MG PO TABS
650.0000 mg | ORAL_TABLET | Freq: Once | ORAL | Status: AC
Start: 2021-06-30 — End: 2021-06-30
  Administered 2021-06-30: 650 mg via ORAL
  Filled 2021-06-30: qty 2

## 2021-06-30 MED ORDER — HYDROCOD POLI-CHLORPHE POLI ER 10-8 MG/5ML PO SUER
5.0000 mL | Freq: Two times a day (BID) | ORAL | Status: DC | PRN
  Filled 2021-06-30: qty 5

## 2021-06-30 MED ORDER — IBUPROFEN 400 MG PO TABS
400.0000 mg | ORAL_TABLET | Freq: Four times a day (QID) | ORAL | Status: DC | PRN
  Filled 2021-06-30: qty 1

## 2021-06-30 MED ORDER — PANTOPRAZOLE SODIUM 40 MG PO TBEC
40.0000 mg | DELAYED_RELEASE_TABLET | Freq: Every day | ORAL | Status: DC
Start: 2021-06-30 — End: 2021-07-02
  Administered 2021-06-30 – 2021-07-02 (×3): 40 mg via ORAL
  Filled 2021-06-30 (×3): qty 1

## 2021-06-30 MED ORDER — ASCORBIC ACID 500 MG PO TABS
500.0000 mg | ORAL_TABLET | Freq: Every day | ORAL | Status: DC
  Administered 2021-06-30 – 2021-07-02 (×3): 500 mg via ORAL
  Filled 2021-06-30 (×4): qty 1

## 2021-06-30 MED ORDER — LEVOTHYROXINE SODIUM 25 MCG PO TABS
125.0000 ug | ORAL_TABLET | Freq: Every day | ORAL | Status: DC
  Administered 2021-07-01 – 2021-07-02 (×2): 125 ug via ORAL
  Filled 2021-06-30 (×2): qty 1

## 2021-06-30 MED ORDER — ASPIRIN EC 81 MG PO TBEC
81.0000 mg | DELAYED_RELEASE_TABLET | Freq: Every morning | ORAL | Status: DC
  Administered 2021-07-01 – 2021-07-02 (×2): 81 mg via ORAL
  Filled 2021-06-30 (×2): qty 1

## 2021-06-30 MED ORDER — AZITHROMYCIN 250 MG PO TABS
500.0000 mg | ORAL_TABLET | Freq: Once | ORAL | Status: AC
  Administered 2021-06-30: 500 mg via ORAL
  Filled 2021-06-30: qty 2

## 2021-06-30 MED ORDER — ENOXAPARIN SODIUM 40 MG/0.4ML IJ SOSY
40.0000 mg | PREFILLED_SYRINGE | Freq: Every day | INTRAMUSCULAR | Status: DC
  Administered 2021-06-30 – 2021-07-02 (×3): 40 mg via SUBCUTANEOUS
  Filled 2021-06-30 (×3): qty 0.4

## 2021-06-30 MED ORDER — PROCHLORPERAZINE MALEATE 10 MG PO TABS
10.0000 mg | ORAL_TABLET | Freq: Four times a day (QID) | ORAL | Status: DC | PRN
  Administered 2021-06-30: 10 mg via ORAL
  Filled 2021-06-30 (×2): qty 1

## 2021-06-30 MED ORDER — ZINC SULFATE 220 (50 ZN) MG PO CAPS
220.0000 mg | ORAL_CAPSULE | Freq: Every day | ORAL | Status: DC
  Administered 2021-06-30 – 2021-07-02 (×3): 220 mg via ORAL
  Filled 2021-06-30 (×4): qty 1

## 2021-06-30 MED ORDER — SODIUM CHLORIDE 0.9 % IV SOLN
1.0000 g | INTRAVENOUS | Status: DC
  Administered 2021-06-30 – 2021-07-01 (×2): 1 g via INTRAVENOUS
  Filled 2021-06-30 (×3): qty 10

## 2021-06-30 MED ORDER — SODIUM CHLORIDE 0.9 % IV SOLN: INTRAVENOUS | Status: AC

## 2021-06-30 MED ORDER — ALBUTEROL SULFATE HFA 108 (90 BASE) MCG/ACT IN AERS
2.0000 | INHALATION_SPRAY | Freq: Four times a day (QID) | RESPIRATORY_TRACT | Status: DC
  Administered 2021-06-30 – 2021-07-01 (×6): 2 via RESPIRATORY_TRACT
  Filled 2021-06-30 (×2): qty 6.7

## 2021-06-30 MED ORDER — AMLODIPINE BESYLATE 5 MG PO TABS
5.0000 mg | ORAL_TABLET | Freq: Every day | ORAL | Status: DC
  Filled 2021-06-30 (×3): qty 1

## 2021-06-30 MED ORDER — GUAIFENESIN-DM 100-10 MG/5ML PO SYRP
10.0000 mL | ORAL_SOLUTION | ORAL | Status: DC | PRN
  Filled 2021-06-30 (×2): qty 10

## 2021-06-30 NOTE — H&P (Signed)
?History and Physical  ? ? ?Patient: [name]:[id number] DOB: 1945/05/23 ?DOA: 06/30/2021 ?DOS: the patient was seen and examined on 06/30/2021 ?PCP: [name], MD  ?Patient coming from: Home - lives with her husband  ? ? ?Chief Complaint: weakness and productive cough/Covid-19 infection ? ?HPI: [name] is a 76 y.o. female with medical history significant of T2DM, HLD, HTN, hx of TIA, depression/anxiety, metastatic pancreatic cancer currently undergoing chemotherapy (last infusion 06/11/21) who was recently diagnosed with covid-19 on 06/20/21. Her symptoms started on 4/4 and she attributed them to mild allergies. She went to ED on 4/5 for dark urine that she described as brown and was worried she may have a stone. She only had congestion and a runny nose at that time. She tested positive on 4/5 while in Ed. She was not given any medication for covid. Her urine is no longer dark. She has been experiencing weakness over the past week, but states this morning she was so weak that she couldn't get out of bed and prompted ED visit. She has had decreased PO intake, but has forced herself to eat. She wasn't aware she had any fevers at home until she got here. She has been taking her temperature. She also has new productive cough that started this week with clear mucous. She has no shortness of breath at rest, but does get winded with exertion or talking a lot. She denies any sick contacts. She has not taken anything OTC for her symptoms.  ? ? ? Denies any fever/chills, vision changes/headaches, chest pain or palpitations, abdominal pain, N/V/D, dysuria or leg swelling.  ? ?She has had her flu shot this year.  ?She had her 2 covid shots and one booster  ? ? ?ER Course:  vitals: temp: 100.7, bp: 128/71,HR: 114, RR: 16, oxygen: 98%RA ?Pertinent labs: wbc: 11.5, AST: 186, Alt: 137, co2: 18, alk phos: 197, ?CXR:  small foci of peripheral airspace opacity in left mid lung, presumed in LUL, consistent with  infection.  ?In ED: BC obtained. Given rocephin and azithromycin  ? ? ? ?Review of Systems: As mentioned in the history of present illness. All other systems reviewed and are negative. ?Past Medical History:  ?Diagnosis Date  ? Anxiety   ? Arthritis   ? Cancer [facility])   ? Depression   ? Diabetes mellitus without complication ([place])   ? Hyperlipemia   ? Hypertension   ? Thyroid disease   ? TIA (transient ischemic attack)   ? Vitamin B12 deficiency 10/20/2019  ? ?Past Surgical History:  ?Procedure Laterality Date  ? ABDOMINAL HYSTERECTOMY    ? BILIARY STENT PLACEMENT N/A 09/28/2020  ? Procedure: BILIARY STENT PLACEMENT;  Surgeon: Clarene Essex, MD;  Location: [facility] ENDOSCOPY;  Service: Endoscopy;  Laterality: N/A;  ? BREAST BIOPSY    ? ERCP N/A 09/28/2020  ? Procedure: ENDOSCOPIC RETROGRADE CHOLANGIOPANCREATOGRAPHY (ERCP);  Surgeon: Clarene Essex, MD;  Location: [facility];  Service: Endoscopy;  Laterality: N/A;  ? IR IMAGING GUIDED PORT INSERTION  09/22/2020  ? SPHINCTEROTOMY  09/28/2020  ? Procedure: SPHINCTEROTOMY;  Surgeon: Clarene Essex, MD;  Location: [facility];  Service: Endoscopy;;  ? TUBAL LIGATION    ? ?Social History:  reports that she quit smoking about 34 years ago. Her smoking use included cigarettes. She has a 10.00 pack-year smoking history. She has never used smokeless tobacco. She reports current alcohol use of about 21.0 standard drinks per week. She reports that she does not use drugs. ? ?  Allergies  ?Allergen Reactions  ? Penicillins Hives and Other (See Comments)  ?  Has patient had a PCN reaction causing immediate rash, facial/tongue/throat swelling, SOB or lightheadedness with hypotension: No ?Has patient had a PCN reaction causing severe rash involving mucus membranes or skin necrosis: No ?Has patient had a PCN reaction that required hospitalization No ?Has patient had a PCN reaction occurring within the last 10 years: No ?If all of the above answers are "NO", then may proceed with Cephalosporin use.   ? ? ?Family History  ?Problem Relation Age of Onset  ? Diabetes Mother   ? Alzheimer's disease Mother   ? Dementia Mother   ? COPD Father   ? Emphysema Father   ? Heart disease Sister   ? Alcohol abuse Brother   ? Heart disease Brother   ? Emphysema Brother   ? Heart attack Daughter   ? Cancer Other   ?     breast cancer  ? ? ?Prior to Admission medications   ?Medication Sig Start Date End Date Taking? Authorizing Provider  ?ACCU-CHEK AVIVA PLUS test strip As needed 05/09/21   [name], MD  ?Accu-Chek Softclix Lancets lancets As needed 04/23/21   [name], MD  ?Alcohol Swabs PADS USE AS DIRECTED 05/18/21   [name], MD  ?ALPRAZolam Duanne Moron) 0.5 MG tablet Take 1 tablet (0.5 mg total) by mouth daily as needed for anxiety. 11/23/20   Owens Shark, NP  ?amLODipine (NORVASC) 5 MG tablet Take 5 mg by mouth daily. 03/30/21   [provider]  ?aspirin EC 81 MG tablet Take 81 mg by mouth every morning.    [provider]  ?Blood Glucose Monitoring Suppl (ACCU-CHEK GUIDE) w/Device KIT As needed 05/11/21   [name], MD  ?cyanocobalamin (,VITAMIN B-12,) 1000 MCG/ML injection Inject 1 mL (1,000 mcg total) into the muscle every 30 (thirty) days. 01/11/21   [name], MD  ?docusate sodium (COLACE) 100 MG capsule Take 100 mg by mouth 2 (two) times daily as needed (constipation).    [provider]  ?levothyroxine (SYNTHROID) 125 MCG tablet TAKE 1 TABLET ON AN EMPTY STOMACH IN THE MORNING 06/04/21   [name], MD  ?lidocaine-prilocaine (EMLA) cream APPLY 1 APPLICATION TOPICALLY AS NEEDED. 01/17/21   Ladell Pier, MD  ?omeprazole (PRILOSEC) 20 MG capsule TAKE 1 CAPSULE BY MOUTH EVERY DAY 04/18/21   [name], MD  ?ondansetron (ZOFRAN) 8 MG tablet Take 1 tablet (8 mg total) by mouth 2 (two) times daily as needed (Nausea or vomiting). 09/12/20   Truitt Merle, MD  ?prochlorperazine (COMPAZINE) 10 MG tablet Take 1 tablet (10 mg total) by mouth every 6 (six) hours as needed  (Nausea or vomiting). 09/12/20   Truitt Merle, MD  ? ? ?Physical Exam: ?Vitals:  ? 06/30/21 0917 06/30/21 1036 06/30/21 1205  ?BP: 128/71 105/79 132/87  ?Pulse: (!) 114 (!) 104 (!) 101  ?Resp: '16 18 18  ' ?Temp: (!) 100.7 ?F (38.2 ?C)    ?TempSrc: Oral    ?SpO2: 98% 98% 98%  ? ?General:  Appears calm and comfortable and is in NAD ?Eyes:  PERRL, EOMI, normal lids, iris ?ENT:  grossly normal hearing, lips & tongue, mmm; appropriate dentition ?Neck:  no LAD, masses or thyromegaly; no carotid bruits ?Cardiovascular:  RRR, no m/r/g. No LE edema.  ?Respiratory:   CTA bilaterally with no wheezes/rales/rhonchi.  Normal respiratory effort. ?Abdomen:  soft, NT, ND, NABS ?Back:   normal  alignment, no CVAT ?Skin:  no rash or induration seen on limited exam. Port in right upper chest ?Musculoskeletal:  grossly normal tone BUE/BLE, good ROM, no bony abnormality ?Lower extremity:  No LE edema.  Limited foot exam with no ulcerations.  2+ distal pulses. ?Psychiatric:  grossly normal mood and affect, speech fluent and appropriate, AOx3 ?Neurologic:  CN 2-12 grossly intact, moves all extremities in coordinated fashion, sensation intact ? ? ?Radiological Exams on Admission: ?Independently reviewed - see discussion in A/P where applicable ? ?DG Chest 2 View ? ?Result Date: 06/30/2021 ?CLINICAL DATA:  Reason for exam: suspected sepsis due to COVID 19 (dx on 4/5); hx of pancreatic cancer EXAM: CHEST - 2 VIEW COMPARISON:  06/20/2021 and older studies. FINDINGS: Cardiac silhouette is normal in size. Normal mediastinal and hilar contours. Right anterior chest wall Port-A-Cath is unchanged. Small hazy airspace opacities in the peripheral left mid lung on the frontal view, presumed in the left upper lobe. Mild focus of stable scarring at the left lung base. Remainder of the lungs is clear. No pleural effusion or pneumothorax. Skeletal structures are demineralized but grossly intact. IMPRESSION: 1. Small foci of peripheral airspace opacity in the left  mid lung, presumed in the left upper lobe, consistent with infection. Appearance supports COVID-19 infection. Electronically Signed   By: Lajean Manes M.D.   On: 06/30/2021 10:39   ? ?EKG: Independently rev

## 2021-06-30 NOTE — Assessment & Plan Note (Signed)
TSH wnl in 10/22 ?With weakness will check TSH ?Continue home synthroid at 146mg daily ?

## 2021-06-30 NOTE — Assessment & Plan Note (Addendum)
Likely secondary to viral infection with covid vs. 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

## 2021-06-30 NOTE — ED Provider Notes (Signed)
?[facility] ?Provider Note ? ? ?CSN: [id number] ?Arrival date & time: 06/30/21  0915 ? ?  ? ?History ? ?No chief complaint on file. ? ? ?[name] is a 76 y.o. female. ? ?HPI ?76 year old female with pancreatic cancer, on concurrent chemo, diagnosis of COVID on April 5 with symptoms beginning around April 4 presents today complaining of increasing weakness.  Initial symptoms with COVID were nasal congestion she thought that she had seasonal allergies.  However, symptoms have progressed to cough productive of yellow sputum and she is having poor appetite but no running or diarrhea.  She reports normal urine output.  She states she is so weak that she is unable to get up and get around.  She states that she is taking chemo every other week but missed this last round of chemo due to the COVID diagnosis.  She was not placed on any medications for COVID. ?  ? ?Home Medications ?Prior to Admission medications   ?Medication Sig Start Date End Date Taking? Authorizing Provider  ?ACCU-CHEK AVIVA PLUS test strip As needed 05/09/21   [name], MD  ?Accu-Chek Softclix Lancets lancets As needed 04/23/21   [name], MD  ?Alcohol Swabs PADS USE AS DIRECTED 05/18/21   [name], MD  ?ALPRAZolam Duanne Moron) 0.5 MG tablet Take 1 tablet (0.5 mg total) by mouth daily as needed for anxiety. 11/23/20   Owens Shark, NP  ?amLODipine (NORVASC) 5 MG tablet Take 5 mg by mouth daily. 03/30/21   [provider]  ?aspirin EC 81 MG tablet Take 81 mg by mouth every morning.    [provider]  ?Blood Glucose Monitoring Suppl (ACCU-CHEK GUIDE) w/Device KIT As needed 05/11/21   [name], MD  ?cyanocobalamin (,VITAMIN B-12,) 1000 MCG/ML injection Inject 1 mL (1,000 mcg total) into the muscle every 30 (thirty) days. 01/11/21   [name], MD  ?docusate sodium (COLACE) 100 MG capsule Take 100 mg by mouth 2 (two) times daily as needed (constipation).    [provider]  ?levothyroxine (SYNTHROID) 125 MCG tablet TAKE 1 TABLET ON AN EMPTY STOMACH IN THE MORNING 06/04/21   [name], MD  ?lidocaine-prilocaine (EMLA) cream APPLY 1 APPLICATION TOPICALLY AS NEEDED. 01/17/21   Ladell Pier, MD  ?omeprazole (PRILOSEC) 20 MG capsule TAKE 1 CAPSULE BY MOUTH EVERY DAY 04/18/21   [name], MD  ?ondansetron (ZOFRAN) 8 MG tablet Take 1 tablet (8 mg total) by mouth 2 (two) times daily as needed (Nausea or vomiting). 09/12/20   Truitt Merle, MD  ?prochlorperazine (COMPAZINE) 10 MG tablet Take 1 tablet (10 mg total) by mouth every 6 (six) hours as needed (Nausea or vomiting). 09/12/20   Truitt Merle, MD  ?   ? ?Allergies    ?Penicillins   ? ?Review of Systems   ?Review of Systems  ?Constitutional:  Positive for activity change, fatigue and fever.  ?HENT:  Positive for congestion.   ?Eyes: Negative.   ?Respiratory:  Positive for cough.   ?Cardiovascular: Negative.   ?Gastrointestinal: Negative.   ?Endocrine: Negative.   ?Genitourinary: Negative.   ?Musculoskeletal: Negative.   ?All other systems reviewed and are negative. ? ?Physical Exam ?Updated Vital Signs ?BP 132/87   Pulse (!) 101   Temp (!) 100.7 ?F (38.2 ?C) (Oral)   Resp 18   SpO2 98%  ?Physical Exam ?Vitals and nursing note reviewed.  ?Constitutional:   ?   General: She  is not in acute distress. ?   Appearance: Normal appearance.  ?HENT:  ?   Head: Normocephalic.  ?   Right Ear: External ear normal.  ?   Left Ear: External ear normal.  ?   Nose: Nose normal.  ?   Mouth/Throat:  ?   Mouth: Mucous membranes are dry.  ?   Pharynx: Oropharynx is clear.  ?Eyes:  ?   Extraocular Movements: Extraocular movements intact.  ?   Pupils: Pupils are equal, round, and reactive to light.  ?Cardiovascular:  ?   Rate and Rhythm: Normal rate and regular rhythm.  ?   Pulses: Normal pulses.  ?Pulmonary:  ?   Effort: Pulmonary effort is normal.  ?   Breath sounds: Normal breath sounds.  ?Abdominal:  ?   General: Bowel sounds are normal.   ?   Palpations: Abdomen is soft.  ?Musculoskeletal:     ?   General: Normal range of motion.  ?   Cervical back: Normal range of motion.  ?Skin: ?   General: Skin is warm and dry.  ?   Capillary Refill: Capillary refill takes less than 2 seconds.  ?Neurological:  ?   General: No focal deficit present.  ?   Mental Status: She is alert.  ?Psychiatric:     ?   Mood and Affect: Mood normal.  ? ? ?ED Results / Procedures / Treatments   ?Labs ?(all labs ordered are listed, but only abnormal results are displayed) ?Labs Reviewed  ?COMPREHENSIVE METABOLIC PANEL - Abnormal; Notable for the following components:  ?    Result Value  ? CO2 18 (*)   ? Glucose, Bld 157 (*)   ? Calcium 8.8 (*)   ? AST 186 (*)   ? ALT 137 (*)   ? Alkaline Phosphatase 197 (*)   ? All other components within normal limits  ?CBC WITH DIFFERENTIAL/PLATELET - Abnormal; Notable for the following components:  ? WBC 11.5 (*)   ? Neutro Abs 9.6 (*)   ? Abs Immature Granulocytes 0.08 (*)   ? All other components within normal limits  ?RESP PANEL BY RT-PCR (FLU A&B, COVID) [id number]  ?CULTURE, BLOOD (ROUTINE X 2)  ?CULTURE, BLOOD (ROUTINE X 2)  ?LACTIC ACID, PLASMA  ?PROTIME-INR  ?LIPASE, BLOOD  ?LACTIC ACID, PLASMA  ?URINALYSIS, ROUTINE W REFLEX MICROSCOPIC  ? ? ?EKG ?None ? ?Radiology ?DG Chest 2 View ? ?Result Date: 06/30/2021 ?CLINICAL DATA:  Reason for exam: suspected sepsis due to COVID 19 (dx on 4/5); hx of pancreatic cancer EXAM: CHEST - 2 VIEW COMPARISON:  06/20/2021 and older studies. FINDINGS: Cardiac silhouette is normal in size. Normal mediastinal and hilar contours. Right anterior chest wall Port-A-Cath is unchanged. Small hazy airspace opacities in the peripheral left mid lung on the frontal view, presumed in the left upper lobe. Mild focus of stable scarring at the left lung base. Remainder of the lungs is clear. No pleural effusion or pneumothorax. Skeletal structures are demineralized but grossly intact. IMPRESSION: 1. Small foci of peripheral  airspace opacity in the left mid lung, presumed in the left upper lobe, consistent with infection. Appearance supports COVID-19 infection. Electronically Signed   By: Lajean Manes M.D.   On: 06/30/2021 10:39   ? ?Procedures ?Procedures  ? ? ?Medications Ordered in ED ?Medications  ?acetaminophen (TYLENOL) tablet 650 mg (650 mg Oral Given 06/30/21 0950)  ? ? ?ED Course/ Medical Decision Making/ A&P ?Clinical Course as of 06/30/21 1227  ?Sat Jun 30, 2021  ?  1147 CBC reviewed interpreted with leukocytosis that is new from first prior [DR]  ?1147 Comprehensive metabolic panel(!) ?Complete metabolic panel reviewed and interpreted with hyperglycemia glucose 157 and CO2 decreased at 18 with elevated LFTs at 186 and 137 [DR]  ?1148 5 days ago LFTs were normal [DR]  ?1148 Lipase, blood ?Lipase reviewed interpreted normal [DR]  ?1148 Lactic acid, plasma ?Lactic acid reviewed interpreted normal [DR]  ?1148 Chest x-[place] reviewed interpreted with opacities noted in mid left lung and left upper lobe [DR]  ?  ?Clinical Course User Index ?[DR] Pattricia Boss, MD  ? ?                        ?Medical Decision Making ?76 year old female on chemotherapy for pancreatic cancer presents with known diagnosis of COVID with increased opacities, increasing leukocytosis, and increasing weakness. ?Patient being given IV fluids and antibiotics to cover community-acquired pneumonia ?Patient with CO2 of 18 consistent with generalized weakness and borderline blood pressures. ?She has blood pressure of 105/79, temperature 100.7, heart rate of 104 consistent with infection and some volume depletion ?Patient has negative lactic acid and does not appear to be septic ?She does appear to have some volume depletion and chest infiltrates consistent with COVID-pneumonia ?Patient has had ongoing poor appetite but has not had increased pain or vomiting.  Elevated LFTs could be secondary to her known pancreatic cancer, but will need to be trended may require  further evaluation with imaging ?Plan Rocephin, Zithromax, IV fluids, consultation for admission for further evaluation and treatment ?DDX_ covid infection with pneumonia- evaluated for severe volume depletion an

## 2021-06-30 NOTE — Assessment & Plan Note (Signed)
Well controlled, continue norvasc '5mg'$  daily  ?

## 2021-06-30 NOTE — Assessment & Plan Note (Signed)
Continue prn xanax daily  ?

## 2021-06-30 NOTE — Assessment & Plan Note (Addendum)
76 year old female presenting with progressive weakness in setting of covid-19 with sepsis criteria  ?-observation to tele  ?-PPE/contact/airborne precautions ?-supportive therapy with covid as symptoms started 06/19/21. CXR consistent with covid pneumonia; however, superimposed bacterial infection can not be ruled out.  ?-check covid labs ?-albuterol, IS to bedside, vitamins, IVF for supportive therapy ?-no steroids as not requiring oxygen ?-outside of treatment window for medication ?-PCT can not be obtained due to active chemotherapy ?-with fever/new productive cough/elevated WBC will start rocephin and azithromycin to cover for superimposed pneumonia. ?-robitussin DM ?-blood cultures pending, lactic acid wnl x 1  ?-trend cbc  ?

## 2021-06-30 NOTE — Assessment & Plan Note (Signed)
Stage IV pancreatic cancer with mets, followed by Dr. Benay Spice oncology ?? Cycle 18 gemcitabine/Abraxane 06/11/2021 ? ?

## 2021-06-30 NOTE — Assessment & Plan Note (Signed)
Generalized and progressive. covid vs. Stage IV metastatic pancreatic cancer ?-check TSH/B12 ?-PT/nutrition ordered  ?

## 2021-06-30 NOTE — ED Notes (Signed)
Pt will remain in triage 5 until treatment room ready. ?

## 2021-06-30 NOTE — Assessment & Plan Note (Signed)
A1c in 12/22 was 6.1, well controlled with diet ?Continue carb modified diet ?accuchecks qac/hs with SSI ?

## 2021-06-30 NOTE — ED Provider Triage Note (Signed)
Emergency Medicine Provider Triage Evaluation Note ? ?[name] , a 76 y.o. female  was evaluated in triage.  Pt complains of 1 week of feeling very weak.  Patient has pancreatic cancer and is currently on chemotherapy.  Last session last week.  Says she also feels somewhat short of breath. ? ?Review of Systems  ?Positive: As above ?Negative:  ? ?Physical Exam  ?BP 128/71 (BP Location: Left Arm)   Pulse (!) 114   Temp (!) 100.7 ?F (38.2 ?C) (Oral)   Resp 16   SpO2 98%  ?Gen:   Awake, no distress   ?Resp:  Normal effort  ?MSK:   Moves extremities without difficulty  ?Other:  Tachycardic, regular rhythm ? ?Medical Decision Making  ?Medically screening exam initiated at 9:31 AM.  Appropriate orders placed.  [name] was informed that the remainder of the evaluation will be completed by another provider, this initial triage assessment does not replace that evaluation, and the importance of remaining in the ED until their evaluation is complete. ? ? ? ?Needs next room, active chemotherapy, pancreatic cancer ?  ?Rhae Hammock, PA-C ?06/30/21 0932 ? ?

## 2021-06-30 NOTE — Plan of Care (Signed)
  Problem: Education: Goal: Knowledge of General Education information will improve Description Including pain rating scale, medication(s)/side effects and non-pharmacologic comfort measures Outcome: Progressing   Problem: Education: Goal: Knowledge of General Education information will improve Description Including pain rating scale, medication(s)/side effects and non-pharmacologic comfort measures Outcome: Progressing   

## 2021-06-30 NOTE — ED Triage Notes (Signed)
Pt to triage via [facility] from home.  + for COVID on 4/5.  Reports generalized weakness since being diagnosed with COVID.  Unable to get up to take dogs out this morning.  SOB with exertion.  Alert and oriented.  Pt has pancreatic cancer and receives chemo.   ? ? ?HR 110 ?98% ?CBG 177 ?18g RAC- NS 750cc ? ?

## 2021-07-01 ENCOUNTER — Other Ambulatory Visit: Payer: Self-pay

## 2021-07-01 DIAGNOSIS — L8990 Pressure ulcer of unspecified site, unspecified stage: Secondary | ICD-10-CM | POA: Insufficient documentation

## 2021-07-01 DIAGNOSIS — E119 Type 2 diabetes mellitus without complications: Secondary | ICD-10-CM | POA: Diagnosis not present

## 2021-07-01 DIAGNOSIS — E039 Hypothyroidism, unspecified: Secondary | ICD-10-CM | POA: Diagnosis not present

## 2021-07-01 DIAGNOSIS — R748 Abnormal levels of other serum enzymes: Secondary | ICD-10-CM

## 2021-07-01 DIAGNOSIS — J1282 Pneumonia due to coronavirus disease 2019: Secondary | ICD-10-CM | POA: Diagnosis not present

## 2021-07-01 DIAGNOSIS — I10 Essential (primary) hypertension: Secondary | ICD-10-CM | POA: Diagnosis not present

## 2021-07-01 DIAGNOSIS — U071 COVID-19: Secondary | ICD-10-CM | POA: Diagnosis not present

## 2021-07-01 LAB — GLUCOSE, CAPILLARY
Glucose-Capillary: 129 mg/dL — ABNORMAL HIGH (ref 70–99)
Glucose-Capillary: 130 mg/dL — ABNORMAL HIGH (ref 70–99)
Glucose-Capillary: 163 mg/dL — ABNORMAL HIGH (ref 70–99)

## 2021-07-01 LAB — COMPREHENSIVE METABOLIC PANEL
ALT: 76 U/L — ABNORMAL HIGH (ref 0–44)
AST: 70 U/L — ABNORMAL HIGH (ref 15–41)
Albumin: 2.9 g/dL — ABNORMAL LOW (ref 3.5–5.0)
Alkaline Phosphatase: 145 U/L — ABNORMAL HIGH (ref 38–126)
Anion gap: 8 (ref 5–15)
BUN: 8 mg/dL (ref 8–23)
CO2: 20 mmol/L — ABNORMAL LOW (ref 22–32)
Calcium: 8.5 mg/dL — ABNORMAL LOW (ref 8.9–10.3)
Chloride: 109 mmol/L (ref 98–111)
Creatinine, Ser: 0.74 mg/dL (ref 0.44–1.00)
GFR, Estimated: >60 mL/min (ref >60)
Glucose, Bld: 130 mg/dL — ABNORMAL HIGH (ref 70–99)
Potassium: 3.6 mmol/L (ref 3.5–5.1)
Sodium: 137 mmol/L (ref 135–145)
Total Bilirubin: 0.6 mg/dL (ref 0.3–1.2)
Total Protein: 5.7 g/dL — ABNORMAL LOW (ref 6.5–8.1)

## 2021-07-01 LAB — CBC WITH DIFFERENTIAL/PLATELET
Abs Immature Granulocytes: 0.03 10*3/uL (ref 0.00–0.07)
Basophils Absolute: 0 10*3/uL (ref 0.0–0.1)
Basophils Relative: 0 %
Eosinophils Absolute: 0.1 10*3/uL (ref 0.0–0.5)
Eosinophils Relative: 1 %
HCT: 32.9 % — ABNORMAL LOW (ref 36.0–46.0)
Hemoglobin: 11 g/dL — ABNORMAL LOW (ref 12.0–15.0)
Immature Granulocytes: 1 %
Lymphocytes Relative: 20 %
Lymphs Abs: 1.2 10*3/uL (ref 0.7–4.0)
MCH: 31.7 pg (ref 26.0–34.0)
MCHC: 33.4 g/dL (ref 30.0–36.0)
MCV: 94.8 fL (ref 80.0–100.0)
Monocytes Absolute: 0.8 10*3/uL (ref 0.1–1.0)
Monocytes Relative: 14 %
Neutro Abs: 3.8 10*3/uL (ref 1.7–7.7)
Neutrophils Relative %: 64 %
Platelets: 161 10*3/uL (ref 150–400)
RBC: 3.47 MIL/uL — ABNORMAL LOW (ref 3.87–5.11)
RDW: 15 % (ref 11.5–15.5)
WBC: 5.9 10*3/uL (ref 4.0–10.5)
nRBC: 0 % (ref 0.0–0.2)

## 2021-07-01 LAB — MAGNESIUM: Magnesium: 1.9 mg/dL (ref 1.7–2.4)

## 2021-07-01 LAB — FERRITIN: Ferritin: 890 ng/mL — ABNORMAL HIGH (ref 11–307)

## 2021-07-01 LAB — C-REACTIVE PROTEIN: CRP: 7.2 mg/dL — ABNORMAL HIGH (ref <1.0)

## 2021-07-01 LAB — D-DIMER, QUANTITATIVE: D-Dimer, Quant: 0.8 ug/mL-FEU — ABNORMAL HIGH (ref 0.00–0.50)

## 2021-07-01 MED ORDER — SODIUM CHLORIDE 0.9 % IV SOLN: 500.0000 mg | INTRAVENOUS | Status: DC

## 2021-07-01 MED ORDER — AZITHROMYCIN 500 MG PO TABS
500.0000 mg | ORAL_TABLET | Freq: Every day | ORAL | Status: DC
  Administered 2021-07-01 – 2021-07-02 (×2): 500 mg via ORAL
  Filled 2021-07-01 (×2): qty 1

## 2021-07-01 NOTE — Plan of Care (Signed)
  Problem: Education: Goal: Knowledge of General Education information will improve Description Including pain rating scale, medication(s)/side effects and non-pharmacologic comfort measures Outcome: Progressing   

## 2021-07-01 NOTE — Progress Notes (Signed)
Education provided to patient and daughter regarding elevated blood sugar and illness. Education given discussing how elevated blood sugar can worsen infection and eating a low carbohydrate diet during illness can help reduce elevated blood sugars. Education provided on importance of taking insulin as prescribed by physician while in the hospital to help reduce worsening infection. Education provided on maintaining excellent oral hygiene to reduce biofilm in mouth. ?Erling Conte, RN  ?

## 2021-07-01 NOTE — Care Management Obs Status (Signed)
MEDICARE OBSERVATION STATUS NOTIFICATION ? ? ?Patient Details  ?Name: [name] ?MRN: [id number] ?Date of Birth: 1946/03/05 ? ? ?Medicare Observation Status Notification Given:  Yes ? ? ? ?Dawayne Patricia, RN ?07/01/2021, 3:49 PM ?

## 2021-07-01 NOTE — Progress Notes (Signed)
? ? ? [facility] ?                                                                            ? ? ?[name], is a 76 y.o. female, DOB - 09/30/1945, WUJ:[id number] ?Admit date - 06/30/2021    ?Outpatient Primary MD for the patient is [name], MD ? ?LOS - 0  days ? ? ? ?Brief summary  ? ?Patient is a 76 year old female with diabetes mellitus, HTN, HLD, history of TIA metastatic pancreatic CA currently undergoing chemotherapy, last infusion on 06/11/2021 was recently diagnosed with COVID-19 on 06/20/2021.Her symptoms started on 4/4 and she attributed them to mild allergies. She went to ED on 4/5 for dark urine that she described as brown and was worried she may have a stone, which has cleared now. She only had congestion and a runny nose at that time. She tested positive on 4/5 while in Ed. She was not given any medication for covid.  ?Per patient, she had decreased p.o. intake, was not aware of any fevers, noted a new productive cough that started this week.  No shortness of breath at rest however got winded with exertion or talking a lot. ?Patient has had 2 COVID vaccinations and one booster. ? ?Assessment & Plan  ? ? ?Assessment and Plan: ?* Pneumonia due to COVID-19 virus with weakness  ?-chest x-ray has left midlung/upper lobe pneumonia, superimposed bacterial infection cannot be ruled out.  Had tested positive on 06/19/2021, was not placed on any medication for COVID. ?-Currently no hypoxia, leukocytosis, respiratory status stable ?-Follow COVID labs, continue supportive treatment ? ? ?Left midlung, upper lobe pneumonia, CAP-present on admission ?-Continue IV Zithromax, Rocephin ? ? ?Generalized debility ?Generalized and progressive. covid vs. Stage IV metastatic pancreatic cancer ?-PT OT evaluation ?-Not eating well, patient requesting regular diet  ? ?Transaminitis ?-Likely due to COVID versus known metastatic pancreatic CA with mets to liver l ?-Improving ? ?Diet-controlled diabetes mellitus  ([place]) ?A1c in 12/22 was 6.1, well controlled with diet ?-Patient is requesting her diet to be changed to regular as she is not eating much ?-Continue sliding scale insulin, diet changed ? ?Essential hypertension ?-Stable, continue Norvasc ? ?Acquired hypothyroidism ?TSH 0.4, continue Synthroid ? ? ?GAD (generalized anxiety disorder) ?Continue prn xanax daily  ? ?Primary pancreatic cancer with metastasis to other site [facility]) ?Stage IV pancreatic cancer with mets, followed by Dr. Benay Spice oncology ?Cycle 18 gemcitabine/Abraxane 06/11/2021 ? ?Pressure injury, POA ?-Stage I, posterior thigh, right ? ?Code Status: Full CODE STATUS ?DVT Prophylaxis:  enoxaparin (LOVENOX) injection 40 mg Start: 06/30/21 1400 ? ? ?Level of Care: Level of care: Telemetry Cardiac ?Family Communication: Alert and oriented ? ?Disposition Plan:     Remains inpatient appropriate: Possible DC home tomorrow if continues to improve.  Lives at home with her husband.  Pending PT evaluation. ? ?Procedures:  ? ? ?Consultants:   ?None ? ?Antimicrobials:  ?Zithromax   4/15--> ?IV Rocephin  4/15 ? ?Medications ? ? albuterol  2 puff Inhalation Q6H  ? amLODipine  5 mg Oral Daily  ? vitamin C  500 mg Oral Daily  ? aspirin EC  81 mg Oral q morning  ? enoxaparin (LOVENOX)  injection  40 mg Subcutaneous Daily  ? fluticasone  1 spray Each Nare BID  ? insulin aspart  0-9 Units Subcutaneous TID WC  ? levothyroxine  125 mcg Oral Q0600  ? pantoprazole  40 mg Oral Daily  ? zinc sulfate  220 mg Oral Daily  ? ? ? ?Subjective:  ? ?Mailin Coglianese was seen and examined today.  Still feels weak, does not like the food.  Not spiking any fevers.  Currently no acute chest pain or shortness of breath.  Not on any O2.   ? ?Objective:  ? ?Vitals:  ? 07/01/21 0054 07/01/21 0339 07/01/21 0400 07/01/21 0857  ?BP:  129/67    ?Pulse:  80 80 85  ?Resp:  (!) '22 20 17  '$ ?Temp:   98.5 ?F (36.9 ?C) 97.7 ?F (36.5 ?C)  ?TempSrc:   Oral Oral  ?SpO2:  98% 98% 98%  ?Weight: 66 kg     ?Height: 5'  3" (1.6 m)     ? ? ?Intake/Output Summary (Last 24 hours) at 07/01/2021 0918 ?Last data filed at 07/01/2021 0400 ?Gross per 24 hour  ?Intake 369.88 ml  ?Output 300 ml  ?Net 69.88 ml  ? ?Filed Weights  ? 07/01/21 0054  ?Weight: 66 kg  ? ? ? ?Exam ?General: Alert and oriented x 3, NAD ?Cardiovascular: S1 S2 auscultated,  RRR ?Respiratory: Diminished breath sounds at the bases otherwise fairly CTA  ?Gastrointestinal: Soft, nontender, nondistended, + bowel sounds ?Ext: no pedal edema bilaterally ?Neuro: no new deficits ?Psych: Normal affect and demeanor, alert and oriented x3  ? ? ?Data Reviewed:  I have personally reviewed following labs  ? ? ?CBC ?Lab Results  ?Component Value Date  ? WBC 5.9 07/01/2021  ? RBC 3.47 (L) 07/01/2021  ? HGB 11.0 (L) 07/01/2021  ? HCT 32.9 (L) 07/01/2021  ? MCV 94.8 07/01/2021  ? MCH 31.7 07/01/2021  ? PLT 161 07/01/2021  ? MCHC 33.4 07/01/2021  ? RDW 15.0 07/01/2021  ? LYMPHSABS 1.2 07/01/2021  ? MONOABS 0.8 07/01/2021  ? EOSABS 0.1 07/01/2021  ? BASOSABS 0.0 07/01/2021  ? ? ? ?Last metabolic panel ?Lab Results  ?Component Value Date  ? NA 137 07/01/2021  ? K 3.6 07/01/2021  ? CL 109 07/01/2021  ? CO2 20 (L) 07/01/2021  ? BUN 8 07/01/2021  ? CREATININE 0.74 07/01/2021  ? GLUCOSE 130 (H) 07/01/2021  ? GFRNONAA >60 07/01/2021  ? GFRAA 71 02/02/2019  ? CALCIUM 8.5 (L) 07/01/2021  ? PROT 5.7 (L) 07/01/2021  ? ALBUMIN 2.9 (L) 07/01/2021  ? LABGLOB 2.2 02/02/2019  ? AGRATIO 2.0 02/02/2019  ? BILITOT 0.6 07/01/2021  ? ALKPHOS 145 (H) 07/01/2021  ? AST 70 (H) 07/01/2021  ? ALT 76 (H) 07/01/2021  ? ANIONGAP 8 07/01/2021  ? ? ?CBG (last 3)  ?Recent Labs  ?  06/30/21 ?1626 06/30/21 ?2102 07/01/21 ?0627  ?GLUCAP 203* 145* 129*  ?  ? ? ?Coagulation Profile: ?Recent Labs  ?Lab 06/30/21 ?0939  ?INR 1.1  ? ? ? ?Radiology Studies: I have personally reviewed the imaging studies  ?DG Chest 2 View ? ?Result Date: 06/30/2021 ?CLINICAL DATA:  Reason for exam: suspected sepsis due to COVID 19 (dx on 4/5); hx of  pancreatic cancer EXAM: CHEST - 2 VIEW COMPARISON:  06/20/2021 and older studies. FINDINGS: Cardiac silhouette is normal in size. Normal mediastinal and hilar contours. Right anterior chest wall Port-A-Cath is unchanged. Small hazy airspace opacities in the peripheral left mid lung on the frontal view,  presumed in the left upper lobe. Mild focus of stable scarring at the left lung base. Remainder of the lungs is clear. No pleural effusion or pneumothorax. Skeletal structures are demineralized but grossly intact. IMPRESSION: 1. Small foci of peripheral airspace opacity in the left mid lung, presumed in the left upper lobe, consistent with infection. Appearance supports COVID-19 infection. Electronically Signed   By: Lajean Manes M.D.   On: 06/30/2021 10:39   ? ? ? ? ?Estill Cotta M.D. ?[facility] ?07/01/2021, 9:18 AM ? ?Available via Epic secure chat 7am-7pm ?After 7 pm, please refer to night coverage provider listed on amion. ? ?  ?

## 2021-07-01 NOTE — Evaluation (Signed)
Physical Therapy Evaluation ?Patient Details ?Name: [name] ?MRN: [id number] ?DOB: 1945-05-04 ?Today's Date: 07/01/2021 ? ?History of Present Illness ? Pt is a 76 y.o. female who presented 06/30/21 with weakness, productive cough, and COVID-19 infection diagnosed 06/20/21. Admitted with COVID PNA. PMH:  T2DM, HLD, HTN, hx of TIA, depression/anxiety, metastatic pancreatic cancer currently undergoing chemotherapy (last infusion 06/11/21) ?  ?Clinical Impression ? Pt presents with condition above and deficits mentioned below, see PT Problem List. PTA, she was living with her husband in a 1-level house with 2-3 STE. At baseline, she does not usually use an AD for mobility, but on occasion when going longer distances outside or when feeling weak from chemo she uses a RW. Currently, pt displays some deficits in bil hip flexor strength, bil toes and fingers sensation, balance, and activity tolerance. She also has full body tremors, L > R. Pt is able to ambulate without UE support, assistance, or LOB, but at a decreased speed, slowing it even further when changing head positions. Her slow speed is suggestive of her being at risk for falls. Recommending HHPT to improve her strength, endurance, and balance. Pt appreciative of this as she was desiring to start PT again due to feeling weak with chemo. Will continue to follow acutely.   ?   ? ?Recommendations for follow up therapy are one component of a multi-disciplinary discharge planning process, led by the attending physician.  Recommendations may be updated based on patient status, additional functional criteria and insurance authorization. ? ?Follow Up Recommendations Home health PT ? ?  ?Assistance Recommended at Discharge PRN  ?Patient can return home with the following ? A little help with bathing/dressing/bathroom;Assistance with cooking/housework;Help with stairs or ramp for entrance ? ?  ?Equipment Recommendations None recommended by PT  ?Recommendations for Other  Services ?    ?  ?Functional Status Assessment Patient has had a recent decline in their functional status and demonstrates the ability to make significant improvements in function in a reasonable and predictable amount of time.  ? ?  ?Precautions / Restrictions Precautions ?Precautions: Fall (low) ?Restrictions ?Weight Bearing Restrictions: No  ? ?  ? ?Mobility ? Bed Mobility ?Overal bed mobility: Modified Independent ?  ?  ?  ?  ?  ?  ?General bed mobility comments: Pt able to perform bed mobility safely without assistance. ?  ? ?Transfers ?Overall transfer level: Needs assistance ?Equipment used: None ?Transfers: Sit to/from Stand ?Sit to Stand: Supervision ?  ?  ?  ?  ?  ?General transfer comment: Supervision for safety, no LOB ?  ? ?Ambulation/Gait ?Ambulation/Gait assistance: Min guard ?Gait Distance (Feet): 315 Feet ?Assistive device: None ?Gait Pattern/deviations: Step-through pattern, Decreased stride length, Shuffle, Trunk flexed, Knee flexed in stance - right, Knee flexed in stance - left ?Gait velocity: reduced ?Gait velocity interpretation: 1.31 - 2.62 ft/sec, indicative of limited community ambulator ?  ?General Gait Details: Pt with slow gait and flexed posture with bil knee flexion noted. Speed improved as distance progressed. No LOB, but significantly slows speed to turn head L and R, min guard for safety. ? ?Stairs ?  ?  ?  ?  ?  ? ?Wheelchair Mobility ?  ? ?Modified Rankin (Stroke Patients Only) ?  ? ?  ? ?Balance Overall balance assessment: Mild deficits observed, not formally tested ?  ?  ?  ?  ?  ?  ?  ?  ?  ?  ?  ?  ?  ?  ?  ?  ?  ?  ?   ? ? ? ?  Pertinent Vitals/Pain Pain Assessment ?Pain Assessment: No/denies pain  ? ? ?Home Living Family/patient expects to be discharged to:: Private residence ?Living Arrangements: Spouse/significant other ?Available Help at Discharge: Family;Available 24 hours/day ?Type of Home: House ?Home Access: Stairs to enter ?Entrance Stairs-Rails: Right  (ascending) ?Entrance Stairs-Number of Steps: 2-3 ?  ?Home Layout: One level ?Home Equipment: Conservation officer, nature (2 wheels);Cane - single point;Grab bars - tub/shower;BSC/3in1;Shower seat;Wheelchair - manual ?   ?  ?Prior Function Prior Level of Function : Needs assist;Driving ?  ?  ?  ?  ?  ?  ?Mobility Comments: Does not use AD majority of time, used RW intermittently when weak initially with chemo. When going long distances outside the home she uses RW. No falls ?ADLs Comments: Husband does majority of household chores. Able to dress herself, needs assistance to bathe her back. Does not like the sensation of showers and cannot get up out of the tub, so transitioned to bird baths. Does not drive often. Manages her own meds and finances. ?  ? ? ?Hand Dominance  ? Dominant Hand: Right ? ?  ?Extremity/Trunk Assessment  ? Upper Extremity Assessment ?Upper Extremity Assessment: Overall WFL for tasks assessed (MMT scores of 4+ to 5 grossly; reports intermittent tingling in figer tips bil; reports arthritis in fingers bil) ?  ? ?Lower Extremity Assessment ?Lower Extremity Assessment: RLE deficits/detail;LLE deficits/detail ?RLE Deficits / Details: Weakness noted in hip flexor with MMT score of 4; reports tingling/numbness in toes bil ?RLE Sensation: decreased light touch ?LLE Deficits / Details: Weakness noted in hip flexor with MMT score of 4; reports tingling/numbness in toes bil ?LLE Sensation: decreased light touch ?  ? ?Cervical / Trunk Assessment ?Cervical / Trunk Assessment: Kyphotic (whole body tremors, L UE > R UE)  ?Communication  ? Communication: No difficulties  ?Cognition Arousal/Alertness: Awake/alert ?Behavior During Therapy: [facility] for tasks assessed/performed ?Overall Cognitive Status: Within Functional Limits for tasks assessed ?  ?  ?  ?  ?  ?  ?  ?  ?  ?  ?  ?  ?  ?  ?  ?  ?  ?  ?  ? ?  ?General Comments General comments (skin integrity, edema, etc.): SpO2 >/= 97% on RA throughout; HR up to 120s ? ?   ?Exercises    ? ?Assessment/Plan  ?  ?PT Assessment Patient needs continued PT services  ?PT Problem List Decreased strength;Decreased activity tolerance;Decreased balance;Decreased mobility;Impaired sensation ? ?   ?  ?PT Treatment Interventions DME instruction;Gait training;Functional mobility training;Stair training;Therapeutic activities;Therapeutic exercise;Balance training;Neuromuscular re-education;Patient/family education   ? ?PT Goals (Current goals can be found in the Care Plan section)  ?Acute Rehab PT Goals ?Patient Stated Goal: to get stronger ?PT Goal Formulation: With patient ?Time For Goal Achievement: 07/15/21 ?Potential to Achieve Goals: Good ? ?  ?Frequency Min 2X/week ?  ? ? ?Co-evaluation   ?  ?  ?  ?  ? ? ?  ?AM-PAC PT "6 Clicks" Mobility  ?Outcome Measure Help needed turning from your back to your side while in a flat bed without using bedrails?: None ?Help needed moving from lying on your back to sitting on the side of a flat bed without using bedrails?: None ?Help needed moving to and from a bed to a chair (including a wheelchair)?: A Little ?Help needed standing up from a chair using your arms (e.g., wheelchair or bedside chair)?: A Little ?Help needed to walk in hospital room?: A Little ?Help needed climbing 3-5 steps  with a railing? : A Little ?6 Click Score: 20 ? ?  ?End of Session   ?Activity Tolerance: Patient tolerated treatment well ?Patient left: in chair;with call bell/phone within reach ?Nurse Communication: Mobility status;Other (comment) (pills on pt's table fell on ground) ?PT Visit Diagnosis: Unsteadiness on feet (R26.81);Other abnormalities of gait and mobility (R26.89);Muscle weakness (generalized) (M62.81) ?  ? ?Time: 5465-0354 ?PT Time Calculation (min) (ACUTE ONLY): 32 min ? ? ?Charges:   PT Evaluation ?$PT Eval Moderate Complexity: 1 Mod ?PT Treatments ?$Gait Training: 8-22 mins ?  ?   ? ? ?Moishe Spice, PT, DPT ?Acute Rehabilitation Services  ?Pager:  [phone] ?Office: [phone] ? ? ?Maretta Bees Pettis ?07/01/2021, 9:37 AM ? ?

## 2021-07-02 ENCOUNTER — Telehealth: Payer: Self-pay

## 2021-07-02 DIAGNOSIS — U071 COVID-19: Secondary | ICD-10-CM | POA: Diagnosis not present

## 2021-07-02 DIAGNOSIS — R748 Abnormal levels of other serum enzymes: Secondary | ICD-10-CM | POA: Diagnosis not present

## 2021-07-02 DIAGNOSIS — F339 Major depressive disorder, recurrent, unspecified: Secondary | ICD-10-CM

## 2021-07-02 DIAGNOSIS — E782 Mixed hyperlipidemia: Secondary | ICD-10-CM | POA: Diagnosis not present

## 2021-07-02 DIAGNOSIS — R531 Weakness: Secondary | ICD-10-CM

## 2021-07-02 DIAGNOSIS — C259 Malignant neoplasm of pancreas, unspecified: Secondary | ICD-10-CM

## 2021-07-02 DIAGNOSIS — I10 Essential (primary) hypertension: Secondary | ICD-10-CM | POA: Diagnosis not present

## 2021-07-02 DIAGNOSIS — F411 Generalized anxiety disorder: Secondary | ICD-10-CM

## 2021-07-02 DIAGNOSIS — E119 Type 2 diabetes mellitus without complications: Secondary | ICD-10-CM | POA: Diagnosis not present

## 2021-07-02 DIAGNOSIS — E039 Hypothyroidism, unspecified: Secondary | ICD-10-CM | POA: Diagnosis not present

## 2021-07-02 LAB — CBC WITH DIFFERENTIAL/PLATELET
Abs Immature Granulocytes: 0.04 10*3/uL (ref 0.00–0.07)
Basophils Absolute: 0 10*3/uL (ref 0.0–0.1)
Basophils Relative: 1 %
Eosinophils Absolute: 0.1 10*3/uL (ref 0.0–0.5)
Eosinophils Relative: 3 %
HCT: 34.7 % — ABNORMAL LOW (ref 36.0–46.0)
Hemoglobin: 11.3 g/dL — ABNORMAL LOW (ref 12.0–15.0)
Immature Granulocytes: 1 %
Lymphocytes Relative: 21 %
Lymphs Abs: 1.1 10*3/uL (ref 0.7–4.0)
MCH: 31 pg (ref 26.0–34.0)
MCHC: 32.6 g/dL (ref 30.0–36.0)
MCV: 95.1 fL (ref 80.0–100.0)
Monocytes Absolute: 0.7 10*3/uL (ref 0.1–1.0)
Monocytes Relative: 14 %
Neutro Abs: 3.1 10*3/uL (ref 1.7–7.7)
Neutrophils Relative %: 60 %
Platelets: 176 10*3/uL (ref 150–400)
RBC: 3.65 MIL/uL — ABNORMAL LOW (ref 3.87–5.11)
RDW: 14.7 % (ref 11.5–15.5)
WBC: 5.1 10*3/uL (ref 4.0–10.5)
nRBC: 0 % (ref 0.0–0.2)

## 2021-07-02 LAB — COMPREHENSIVE METABOLIC PANEL
ALT: 52 U/L — ABNORMAL HIGH (ref 0–44)
AST: 30 U/L (ref 15–41)
Albumin: 3 g/dL — ABNORMAL LOW (ref 3.5–5.0)
Alkaline Phosphatase: 117 U/L (ref 38–126)
Anion gap: 6 (ref 5–15)
BUN: 9 mg/dL (ref 8–23)
CO2: 21 mmol/L — ABNORMAL LOW (ref 22–32)
Calcium: 9 mg/dL (ref 8.9–10.3)
Chloride: 112 mmol/L — ABNORMAL HIGH (ref 98–111)
Creatinine, Ser: 0.85 mg/dL (ref 0.44–1.00)
GFR, Estimated: >60 mL/min (ref >60)
Glucose, Bld: 135 mg/dL — ABNORMAL HIGH (ref 70–99)
Potassium: 4.1 mmol/L (ref 3.5–5.1)
Sodium: 139 mmol/L (ref 135–145)
Total Bilirubin: 0.7 mg/dL (ref 0.3–1.2)
Total Protein: 5.9 g/dL — ABNORMAL LOW (ref 6.5–8.1)

## 2021-07-02 LAB — D-DIMER, QUANTITATIVE: D-Dimer, Quant: 1.44 ug/mL-FEU — ABNORMAL HIGH (ref 0.00–0.50)

## 2021-07-02 LAB — C-REACTIVE PROTEIN: CRP: 6.7 mg/dL — ABNORMAL HIGH (ref <1.0)

## 2021-07-02 LAB — FERRITIN: Ferritin: 468 ng/mL — ABNORMAL HIGH (ref 11–307)

## 2021-07-02 LAB — GLUCOSE, CAPILLARY: Glucose-Capillary: 183 mg/dL — ABNORMAL HIGH (ref 70–99)

## 2021-07-02 LAB — MAGNESIUM: Magnesium: 2 mg/dL (ref 1.7–2.4)

## 2021-07-02 MED ORDER — CEFDINIR 300 MG PO CAPS: 300.0000 mg | ORAL_CAPSULE | Freq: Two times a day (BID) | ORAL | 0 refills | Status: AC

## 2021-07-02 MED ORDER — ZINC SULFATE 220 (50 ZN) MG PO CAPS: 220.0000 mg | ORAL_CAPSULE | Freq: Every day | ORAL | 0 refills | Status: DC

## 2021-07-02 MED ORDER — DOXYCYCLINE HYCLATE 100 MG PO TABS: 100.0000 mg | ORAL_TABLET | Freq: Two times a day (BID) | ORAL | 0 refills | Status: AC

## 2021-07-02 MED ORDER — ASCORBIC ACID 500 MG PO TABS: 500.0000 mg | ORAL_TABLET | Freq: Every day | ORAL | Status: DC

## 2021-07-02 MED ORDER — ALBUTEROL SULFATE HFA 108 (90 BASE) MCG/ACT IN AERS
2.0000 | INHALATION_SPRAY | Freq: Two times a day (BID) | RESPIRATORY_TRACT | Status: DC
Start: 2021-07-02 — End: 2021-07-02
  Administered 2021-07-02: 2 via RESPIRATORY_TRACT
  Filled 2021-07-02: qty 6.7

## 2021-07-02 MED ORDER — GUAIFENESIN-DM 100-10 MG/5ML PO SYRP: 10.0000 mL | ORAL_SOLUTION | ORAL | 0 refills | Status: DC | PRN

## 2021-07-02 MED ORDER — ALBUTEROL SULFATE HFA 108 (90 BASE) MCG/ACT IN AERS: 2.0000 | INHALATION_SPRAY | Freq: Four times a day (QID) | RESPIRATORY_TRACT | 0 refills | Status: DC | PRN

## 2021-07-02 NOTE — Telephone Encounter (Signed)
I received a message to contact Langston Reusing, patient's daughter, regarding concerns she had. She advised that  when she was hospitalized they gave her antibiotics and medicine for COVID which made her feel a lot better. She wanted to know why our providers did not put her on antibiotics or give her medicine to prevent her going to the hospital. I did explain that with COVID some medication are indicated to be given only so many days on the onset of getting COIVD. When she came to the office last, she did not have any symptoms, but again with COVID symptoms can change quickly. Daughter seemed satisfied with explanation. Said she made an appointment to see Dr. Jonni Sanger as indicated by her hospital discharge summary.  ?

## 2021-07-02 NOTE — Hospital Course (Signed)
Patient is a 76 year old female with past medical history of diabetes mellitus, hypertension, hyperlipidemia, history of TIA and metastatic pancreatic cancer undergoing chemotherapy last infusion on 06/11/2021 and recent diagnosis of COVID-19 disease on 06/20/2021 presented to hospital with decreased oral intake and cough with dyspnea on exertion.   ? ?Assessment and plan ? ?Pneumonia due to COVID-19 virus with weakness  ?chest x-ray has left midlung/upper lobe pneumonia, superimposed bacterial infection cannot be ruled out.  Had tested positive on 06/19/2021, was not placed on any medication for COVID.  Patient did not have any leukocytosis hypoxia and respiratory status was normal.  Patient was continued on supportive care and was on room air without respiratory distress prior to discharge.  Patient was however on Rocephin and Zithromax during hospitalization for possible superimposed bacterial pneumonia.  This will be continued on discharge to complete the course.  ?  ?Generalized debility ?Patient with history of stage IV metastatic pancreatic cancer with decreased appetite and recent COVID illness.  Physical therapy  evaluated patient during hospitalization and recommended home PT on discharge.   ?  ?Elevated LFTs. ?-Likely due to COVID versus known metastatic pancreatic CA with mets to liver  ?  ?Diet-controlled diabetes mellitus ([place]) ?Hemoglobin A1c in 12/22 was 6.1, well controlled with diet ?  ?Essential hypertension ?Blood pressure remained stable.  On Norvasc as outpatient but ?  ?Acquired hypothyroidism ?TSH 0.4, continue Synthroid on discharge.  ?  ?GAD (generalized anxiety disorder) ?Continue prn xanax daily  ?  ?Primary pancreatic cancer with metastasis to other site [facility]) ?Stage IV pancreatic cancer with mets, followed by Dr. Benay Spice oncology as outpatient. ?Cycle 18 gemcitabine/Abraxane 06/11/2021 ?  ?Pressure injury, POA ?-Stage I, posterior thigh, right.  Supportive care. ?  ?

## 2021-07-02 NOTE — Plan of Care (Signed)
?  Problem: Education: ?Goal: Knowledge of General Education information will improve ?Description: Including pain rating scale, medication(s)/side effects and non-pharmacologic comfort measures ?Outcome: Adequate for Discharge ?  ?Problem: Health Behavior/Discharge Planning: ?Goal: Ability to manage health-related needs will improve ?Outcome: Adequate for Discharge ?  ?Problem: Clinical Measurements: ?Goal: Ability to maintain clinical measurements within normal limits will improve ?Outcome: Adequate for Discharge ?Goal: Will remain free from infection ?Outcome: Adequate for Discharge ?Goal: Diagnostic test results will improve ?Outcome: Adequate for Discharge ?Goal: Respiratory complications will improve ?Outcome: Adequate for Discharge ?Goal: Cardiovascular complication will be avoided ?Outcome: Adequate for Discharge ?  ?Problem: Activity: ?Goal: Risk for activity intolerance will decrease ?Outcome: Adequate for Discharge ?  ?Problem: Nutrition: ?Goal: Adequate nutrition will be maintained ?Outcome: Adequate for Discharge ?  ?Problem: Coping: ?Goal: Level of anxiety will decrease ?Outcome: Adequate for Discharge ?  ?Problem: Elimination: ?Goal: Will not experience complications related to bowel motility ?Outcome: Adequate for Discharge ?Goal: Will not experience complications related to urinary retention ?Outcome: Adequate for Discharge ?  ?Problem: Pain Managment: ?Goal: General experience of comfort will improve ?Outcome: Adequate for Discharge ?  ?Problem: Safety: ?Goal: Ability to remain free from injury will improve ?Outcome: Adequate for Discharge ?  ?Problem: Skin Integrity: ?Goal: Risk for impaired skin integrity will decrease ?Outcome: Adequate for Discharge ?  ?Problem: Acute Rehab PT Goals(only PT should resolve) ?Goal: Patient Will Transfer Sit To/From Stand ?Outcome: Adequate for Discharge ?Goal: Pt Will Transfer Bed To Chair/Chair To Bed ?Outcome: Adequate for Discharge ?Goal: Pt Will  Ambulate ?Outcome: Adequate for Discharge ?Goal: Pt Will Go Up/Down Stairs ?Outcome: Adequate for Discharge ?  ?

## 2021-07-02 NOTE — Progress Notes (Signed)
Patient given discharge instructions and stated understanding. 

## 2021-07-02 NOTE — Telephone Encounter (Addendum)
TC from Pt's daughter stating Pt is in the hospital for pneumonia due to covid. Pt's daughter inquiring why Pt was not given a antibiotic. Informed Pt's daughter that Dr Benay Spice did not see Pt and Dr Benay Spice does not treat for Covid. Informed Pt's daughter that she should inquire with Pt's PCP. Pt's daughter verbalized understanding. ?

## 2021-07-02 NOTE — Discharge Summary (Signed)
?Physician Discharge Summary ?  ?Patient: [name] MRN: [id number] DOB: 1945/06/22  ?Admit date:     06/30/2021  ?Discharge date: 07/02/21  ?Discharge Physician: Corrie Mckusick Jevon Littlepage  ? ?PCP: [name], MD  ? ?Recommendations at discharge:  ? ?Follow-up with your primary care physician in 1 week.   ?Check BMP LFTs magnesium in the next visit. ?Follow-up with oncology as has been scheduled ? ?Discharge Diagnoses: ?Principal Problem: ?  Pneumonia due to COVID-19 virus with weakness  ?Active Problems: ?  Weakness ?  Elevated liver enzymes ?  Diet-controlled diabetes mellitus ([place]) ?  Essential hypertension ?  Acquired hypothyroidism ?  GAD (generalized anxiety disorder) ?  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  Pressure injury of skin ? ?Resolved Problems: ?  Mixed hyperlipidemia ?  Major depression, recurrent, chronic ([place]) ? ?Hospital Course: ?Patient is a 76 year old female with past medical history of diabetes mellitus, hypertension, hyperlipidemia, history of TIA and metastatic pancreatic cancer undergoing chemotherapy last infusion on 06/11/2021 and recent diagnosis of COVID-19 disease on 06/20/2021 presented to hospital with decreased oral intake and cough with dyspnea on exertion.   ? ?Assessment and plan ? ?Pneumonia due to COVID-19 virus with weakness  ?chest x-ray has left midlung/upper lobe pneumonia, superimposed bacterial infection cannot be ruled out.  Had tested positive on 06/19/2021, was not placed on any medication for COVID.  Patient did not have any leukocytosis hypoxia and respiratory status was normal.  Patient was continued on supportive care and was on room air without respiratory distress prior to discharge.  Patient was however on Rocephin and Zithromax during hospitalization for possible superimposed bacterial pneumonia.  This will be continued on discharge to complete the course.  ?  ?Generalized debility ?Patient with history of stage IV metastatic pancreatic  cancer with decreased appetite and recent COVID illness.  Physical therapy  evaluated patient during hospitalization and recommended home PT on discharge.   ?  ?Elevated LFTs. ?-Likely due to COVID versus known metastatic pancreatic CA with mets to liver  ?  ?Diet-controlled diabetes mellitus ([place]) ?Hemoglobin A1c in 12/22 was 6.1, well controlled with diet ?  ?Essential hypertension ?Blood pressure remained stable.  On Norvasc as outpatient but ?  ?Acquired hypothyroidism ?TSH 0.4, continue Synthroid on discharge.  ?  ?GAD (generalized anxiety disorder) ?Continue prn xanax daily  ?  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 Benay Spice oncology as outpatient. ?Cycle 18 gemcitabine/Abraxane 06/11/2021 ?  ?Pressure injury, POA ?-Stage I, posterior thigh, right.  Supportive care. ?  ? ?Consultants: None ?Procedures performed: None ?Disposition: Home ?Diet recommendation:  ?Discharge Diet Orders (From admission, onward)  ? ?  Start     Ordered  ? 07/02/21 0000  Diet general       ? 07/02/21 1406  ? ?  ?  ? ?  ? ?Regular diet ?DISCHARGE MEDICATION: ?Allergies as of 07/02/2021   ? ?   Reactions  ? Penicillins Hives, Other (See Comments)  ? Has patient had a PCN reaction causing immediate rash, facial/tongue/throat swelling, SOB or lightheadedness with hypotension: No ?Has patient had a PCN reaction causing severe rash involving mucus membranes or skin necrosis: No ?Has patient had a PCN reaction that required hospitalization No ?Has patient had a PCN reaction occurring within the last 10 years: No ?If all of the above answers are "NO", then may proceed with Cephalosporin use.  ? ?  ? ?  ?  Medication List  ?  ? ?TAKE these medications   ? ?Accu-Chek Aviva Plus test strip ?Generic drug: glucose blood ?As needed ?What changed:  ?how much to take ?how to take this ?when to take this ?  ?Accu-Chek Guide w/Device Kit ?As needed ?What changed:  ?how much to  take ?how to take this ?when to take this ?  ?Accu-Chek Softclix Lancets lancets ?As needed ?What changed:  ?how much to take ?how to take this ?when to take this ?  ?albuterol 108 (90 Base) MCG/ACT inhaler ?Commonly known as: VENTOLIN HFA ?Inhale 2 puffs into the lungs every 6 (six) hours as needed for wheezing or shortness of breath. ?  ?Alcohol Swabs Pads ?USE AS DIRECTED ?What changed:  ?how much to take ?how to take this ?when to take this ?  ?ALPRAZolam 0.5 MG tablet ?Commonly known as: Duanne Moron ?Take 1 tablet (0.5 mg total) by mouth daily as needed for anxiety. ?  ?amLODipine 5 MG tablet ?Commonly known as: NORVASC ?Take 5 mg by mouth daily. ?  ?ascorbic acid 500 MG tablet ?Commonly known as: VITAMIN C ?Take 1 tablet (500 mg total) by mouth daily. ?Start taking on: July 03, 2021 ?  ?aspirin EC 81 MG tablet ?Take 81 mg by mouth every morning. ?  ?cefdinir 300 MG capsule ?Commonly known as: OMNICEF ?Take 1 capsule (300 mg total) by mouth 2 (two) times daily for 5 days. ?  ?cyanocobalamin 1000 MCG/ML injection ?Commonly known as: (VITAMIN B-12) ?Inject 1 mL (1,000 mcg total) into the muscle every 30 (thirty) days. ?  ?docusate sodium 100 MG capsule ?Commonly known as: COLACE ?Take 100 mg by mouth 2 (two) times daily as needed (constipation). ?  ?doxycycline 100 MG tablet ?Commonly known as: VIBRA-TABS ?Take 1 tablet (100 mg total) by mouth 2 (two) times daily for 5 days. ?  ?guaiFENesin-dextromethorphan 100-10 MG/5ML syrup ?Commonly known as: ROBITUSSIN DM ?Take 10 mLs by mouth every 4 (four) hours as needed for cough. ?  ?levothyroxine 125 MCG tablet ?Commonly known as: SYNTHROID ?TAKE 1 TABLET ON AN EMPTY STOMACH IN THE MORNING ?What changed: See the new instructions. ?  ?lidocaine-prilocaine cream ?Commonly known as: EMLA ?APPLY 1 APPLICATION TOPICALLY AS NEEDED. ?What changed: See the new instructions. ?  ?omeprazole 20 MG capsule ?Commonly known as: PRILOSEC ?TAKE 1 CAPSULE BY MOUTH EVERY DAY ?What changed:  how much to take ?  ?ondansetron 8 MG tablet ?Commonly known as: Zofran ?Take 1 tablet (8 mg total) by mouth 2 (two) times daily as needed (Nausea or vomiting). ?  ?prochlorperazine 10 MG tablet ?Commonly known as: COMPAZINE ?Take 1 tablet (10 mg total) by mouth every 6 (six) hours as needed (Nausea or vomiting). ?  ?zinc sulfate 220 (50 Zn) MG capsule ?Take 1 capsule (220 mg total) by mouth daily. ?Start taking on: July 03, 2021 ?  ? ?  ? ?Subjective ?Today, patient was seen and examined at bedside.  Patient denies any pain, nausea, vomiting, increasing shortness of breath.  Not on oxygen.  Wants to go home. ? ?Discharge Exam: ?Filed Weights  ? 07/01/21 0054  ?Weight: 66 kg  ? ? ?  07/02/2021  ? 12:16 PM 07/02/2021  ? 10:11 AM 07/02/2021  ?  7:00 AM  ?Vitals with BMI  ?Systolic [phone]  ?Diastolic 77 76 71  ?Pulse 88 99 86  ?  ?Body mass index is 25.77 kg/m?.  ?General:  Average built, not in obvious distress ?HENT:   No scleral pallor  or icterus noted. Oral mucosa is moist.  ?Chest:   Diminished breath sounds bilaterally.  ?CVS: S1 &S2 heard. No murmur.  Regular rate and rhythm. ?Abdomen: Soft, nontender, nondistended.  Bowel sounds are heard.   ?Extremities: No cyanosis, clubbing or edema.  Peripheral pulses are palpable. ?Psych: Alert, awake and oriented, normal mood ?CNS:  No cranial nerve deficits.  Power equal in all extremities.   ?Skin: Warm and dry.  No rashes noted. ? ?Condition at discharge: fair ? ?The results of significant diagnostics from this hospitalization (including imaging, microbiology, ancillary and laboratory) are listed below for reference.  ? ?Imaging Studies: ?DG Chest 2 View ? ?Result Date: 06/30/2021 ?CLINICAL DATA:  Reason for exam: suspected sepsis due to COVID 19 (dx on 4/5); hx of pancreatic cancer EXAM: CHEST - 2 VIEW COMPARISON:  06/20/2021 and older studies. FINDINGS: Cardiac silhouette is normal in size. Normal mediastinal and hilar contours. Right anterior chest wall  Port-A-Cath is unchanged. Small hazy airspace opacities in the peripheral left mid lung on the frontal view, presumed in the left upper lobe. Mild focus of stable scarring at the left lung base. Remainder of the lu

## 2021-07-02 NOTE — Telephone Encounter (Signed)
Pt is currently hospitalized with pneumonia ?

## 2021-07-04 ENCOUNTER — Other Ambulatory Visit: Payer: Self-pay | Admitting: Oncology

## 2021-07-04 ENCOUNTER — Telehealth: Payer: Self-pay | Admitting: *Deleted

## 2021-07-04 DIAGNOSIS — C259 Malignant neoplasm of pancreas, unspecified: Secondary | ICD-10-CM

## 2021-07-04 DIAGNOSIS — Z95828 Presence of other vascular implants and grafts: Secondary | ICD-10-CM

## 2021-07-04 NOTE — Telephone Encounter (Signed)
Kashira Behunin was contacted by telephone to verify understanding of discharge instructions status post their most recent discharge from the hospital on the date:  07/02/21.  Inpatient discharge AVS was re-reviewed with patient, along with cancer center appointments.  Verification of understanding for oncology specific follow-up was validated using the Teach Back method.   ?She has not needed her MDI or cough medication-only coughs early am. Still weak, but getting stronger. She has started the zinc and vitamin c and taking her antibiotics without any adverse effects. Taste for food is returning. ? ?Transportation to appointments were confirmed for the patient as being self/caregiver. ? ?Pattye Avans?s questions were addressed to their satisfaction upon completion of this post discharge follow-up call for outpatient oncology.  ?

## 2021-07-05 ENCOUNTER — Other Ambulatory Visit: Payer: Self-pay | Admitting: Hematology

## 2021-07-05 ENCOUNTER — Other Ambulatory Visit: Payer: Self-pay

## 2021-07-05 DIAGNOSIS — C259 Malignant neoplasm of pancreas, unspecified: Secondary | ICD-10-CM

## 2021-07-05 LAB — CULTURE, BLOOD (ROUTINE X 2)
Culture: NO GROWTH
Culture: NO GROWTH
Special Requests: ADEQUATE

## 2021-07-05 MED ORDER — SODIUM CHLORIDE 0.9 % IV SOLN: INTRAVENOUS | Status: DC

## 2021-07-06 ENCOUNTER — Inpatient Hospital Stay: Payer: Medicare HMO

## 2021-07-06 ENCOUNTER — Inpatient Hospital Stay: Payer: Medicare HMO | Admitting: Oncology

## 2021-07-06 VITALS — BP 136/67 | HR 91 | Temp 98.2°F | Resp 18 | Ht 63.0 in | Wt 148.0 lb

## 2021-07-06 DIAGNOSIS — Z8616 Personal history of COVID-19: Secondary | ICD-10-CM | POA: Diagnosis not present

## 2021-07-06 DIAGNOSIS — Z5111 Encounter for antineoplastic chemotherapy: Secondary | ICD-10-CM | POA: Diagnosis not present

## 2021-07-06 DIAGNOSIS — I10 Essential (primary) hypertension: Secondary | ICD-10-CM | POA: Diagnosis not present

## 2021-07-06 DIAGNOSIS — C259 Malignant neoplasm of pancreas, unspecified: Secondary | ICD-10-CM

## 2021-07-06 DIAGNOSIS — G893 Neoplasm related pain (acute) (chronic): Secondary | ICD-10-CM | POA: Diagnosis not present

## 2021-07-06 DIAGNOSIS — E119 Type 2 diabetes mellitus without complications: Secondary | ICD-10-CM | POA: Diagnosis not present

## 2021-07-06 DIAGNOSIS — Z95828 Presence of other vascular implants and grafts: Secondary | ICD-10-CM

## 2021-07-06 DIAGNOSIS — M1990 Unspecified osteoarthritis, unspecified site: Secondary | ICD-10-CM | POA: Diagnosis not present

## 2021-07-06 LAB — CBC WITH DIFFERENTIAL (CANCER CENTER ONLY)
Abs Immature Granulocytes: 0.06 10*3/uL (ref 0.00–0.07)
Basophils Absolute: 0.1 10*3/uL (ref 0.0–0.1)
Basophils Relative: 1 %
Eosinophils Absolute: 0.1 10*3/uL (ref 0.0–0.5)
Eosinophils Relative: 1 %
HCT: 37.2 % (ref 36.0–46.0)
Hemoglobin: 12.1 g/dL (ref 12.0–15.0)
Immature Granulocytes: 1 %
Lymphocytes Relative: 18 %
Lymphs Abs: 2.2 10*3/uL (ref 0.7–4.0)
MCH: 30.6 pg (ref 26.0–34.0)
MCHC: 32.5 g/dL (ref 30.0–36.0)
MCV: 94.2 fL (ref 80.0–100.0)
Monocytes Absolute: 0.9 10*3/uL (ref 0.1–1.0)
Monocytes Relative: 8 %
Neutro Abs: 9 10*3/uL — ABNORMAL HIGH (ref 1.7–7.7)
Neutrophils Relative %: 71 %
Platelet Count: 217 10*3/uL (ref 150–400)
RBC: 3.95 MIL/uL (ref 3.87–5.11)
RDW: 15.1 % (ref 11.5–15.5)
WBC Count: 12.3 10*3/uL — ABNORMAL HIGH (ref 4.0–10.5)
nRBC: 0 % (ref 0.0–0.2)

## 2021-07-06 LAB — CMP (CANCER CENTER ONLY)
ALT: 93 U/L — ABNORMAL HIGH (ref 0–44)
AST: 162 U/L — ABNORMAL HIGH (ref 15–41)
Albumin: 3.9 g/dL (ref 3.5–5.0)
Alkaline Phosphatase: 314 U/L — ABNORMAL HIGH (ref 38–126)
Anion gap: 11 (ref 5–15)
BUN: 16 mg/dL (ref 8–23)
CO2: 21 mmol/L — ABNORMAL LOW (ref 22–32)
Calcium: 10.2 mg/dL (ref 8.9–10.3)
Chloride: 103 mmol/L (ref 98–111)
Creatinine: 0.71 mg/dL (ref 0.44–1.00)
GFR, Estimated: >60 mL/min (ref >60)
Glucose, Bld: 211 mg/dL — ABNORMAL HIGH (ref 70–99)
Potassium: 4.2 mmol/L (ref 3.5–5.1)
Sodium: 135 mmol/L (ref 135–145)
Total Bilirubin: 1.1 mg/dL (ref 0.3–1.2)
Total Protein: 7.1 g/dL (ref 6.5–8.1)

## 2021-07-06 MED ORDER — HEPARIN SOD (PORK) LOCK FLUSH 100 UNIT/ML IV SOLN
500.0000 [IU] | Freq: Once | INTRAVENOUS | Status: AC
  Administered 2021-07-06: 500 [IU] via INTRAVENOUS

## 2021-07-06 MED ORDER — SODIUM CHLORIDE 0.9% FLUSH
10.0000 mL | Freq: Once | INTRAVENOUS | Status: AC
  Administered 2021-07-06: 10 mL via INTRAVENOUS

## 2021-07-06 MED ORDER — SODIUM CHLORIDE 0.9 % IV SOLN: INTRAVENOUS | Status: DC

## 2021-07-06 NOTE — Patient Instructions (Signed)
Rehydration, Adult Rehydration is the replacement of body fluids, salts, and minerals (electrolytes) that are lost during dehydration. Dehydration is when there is not enough water or other fluids in the body. This happens when you lose more fluids than you take in. Common causes of dehydration include: Not drinking enough fluids. This can occur when you are ill or doing activities that require a lot of energy, especially in hot weather. Conditions that cause loss of water or other fluids, such as diarrhea, vomiting, sweating, or urinating a lot. Other illnesses, such as fever or infection. Certain medicines, such as those that remove excess fluid from the body (diuretics). Symptoms of mild or moderate dehydration may include thirst, dry lips and mouth, and dizziness. Symptoms of severe dehydration may include increased heart rate, confusion, fainting, and not urinating. For severe dehydration, you may need to get fluids through an IV at the hospital. For mild or moderate dehydration, you can usually rehydrate at home by drinking certain fluids as told by your health care provider. What are the risks? Generally, rehydration is safe. However, taking in too much fluid (overhydration) can be a problem. This is rare. Overhydration can cause an electrolyte imbalance, kidney failure, or a decrease in salt (sodium) levels in the body. Supplies needed You will need an oral rehydration solution (ORS) if your health care provider tells you to use one. This is a drink to treat dehydration. It can be found in pharmacies and retail stores. How to rehydrate Fluids Follow instructions from your health care provider for rehydration. The kind of fluid and the amount you should drink depend on your condition. In general, you should choose drinks that you prefer. If told by your health care provider, drink an ORS. Make an ORS by following instructions on the package. Start by drinking small amounts, about  cup (120  mL) every 5-10 minutes. Slowly increase how much you drink until you have taken the amount recommended by your health care provider. Drink enough clear fluids to keep your urine pale yellow. If you were told to drink an ORS, finish it first, then start slowly drinking other clear fluids. Drink fluids such as: Water. This includes sparkling water and flavored water. Drinking only water can lead to having too little sodium in your body (hyponatremia). Follow the advice of your health care provider. Water from ice chips you suck on. Fruit juice with water you add to it (diluted). Sports drinks. Hot or cold herbal teas. Broth-based soups. Milk or milk products. Food Follow instructions from your health care provider about what to eat while you rehydrate. Your health care provider may recommend that you slowly begin eating regular foods in small amounts. Eat foods that contain a healthy balance of electrolytes, such as bananas, oranges, potatoes, tomatoes, and spinach. Avoid foods that are greasy or contain a lot of sugar. In some cases, you may get nutrition through a feeding tube that is passed through your nose and into your stomach (nasogastric tube, or NG tube). This may be done if you have uncontrolled vomiting or diarrhea. Beverages to avoid  Certain beverages may make dehydration worse. While you rehydrate, avoid drinking alcohol. How to tell if you are recovering from dehydration You may be recovering from dehydration if: You are urinating more often than before you started rehydrating. Your urine is pale yellow. Your energy level improves. You vomit less frequently. You have diarrhea less frequently. Your appetite improves or returns to normal. You feel less dizzy or less light-headed.   Your skin tone and color start to look more normal. Follow these instructions at home: Take over-the-counter and prescription medicines only as told by your health care provider. Do not take sodium  tablets. Doing this can lead to having too much sodium in your body (hypernatremia). Contact a health care provider if: You continue to have symptoms of mild or moderate dehydration, such as: Thirst. Dry lips. Slightly dry mouth. Dizziness. Dark urine or less urine than normal. Muscle cramps. You continue to vomit or have diarrhea. Get help right away if you: Have symptoms of dehydration that get worse. Have a fever. Have a severe headache. Have been vomiting and the following happens: Your vomiting gets worse or does not go away. Your vomit includes blood or green matter (bile). You cannot eat or drink without vomiting. Have problems with urination or bowel movements, such as: Diarrhea that gets worse or does not go away. Blood in your stool (feces). This may cause stool to look black and tarry. Not urinating, or urinating only a small amount of very dark urine, within 6-8 hours. Have trouble breathing. Have symptoms that get worse with treatment. These symptoms may represent a serious problem that is an emergency. Do not wait to see if the symptoms will go away. Get medical help right away. Call your local emergency services (911 in the [place] Do not drive yourself to the hospital. Summary Rehydration is the replacement of body fluids and minerals (electrolytes) that are lost during dehydration. Follow instructions from your health care provider for rehydration. The kind of fluid and amount you should drink depend on your condition. Slowly increase how much you drink until you have taken the amount recommended by your health care provider. Contact your health care provider if you continue to show signs of mild or moderate dehydration. This information is not intended to replace advice given to you by your health care provider. Make sure you discuss any questions you have with your health care provider. Document Revised: 05/05/2019 Document Reviewed: 03/15/2019 Elsevier Patient  Education  2023 [employer]  

## 2021-07-06 NOTE — Progress Notes (Signed)
?[place] ?OFFICE PROGRESS NOTE ? ? ?Diagnosis: Pancreas cancer ? ?INTERVAL HISTORY:  ? ?[name] completed another cycle of gemcitabine/Abraxane on 06/11/2021.  No change in neuropathy symptoms.  She presented to the emergency room with dysuria on 06/20/2021.  She was diagnosed with COVID-19 infection.  Blood and urine cultures were negative.  She presented back to the emergency room 06/30/2021 with weakness and a cough.  She was admitted and treated with IV antibiotics.  A chest x-ray revealed left lung opacities. ?She was discharged to home on 07/02/2021.  She is completing an outpatient course of cefdinir and doxycycline.  She reports feeling well. ?She requested IV fluids today in anticipation of feeling weak over the weekend. ?Objective: ? ?Vital signs in last 24 hours: ? ?Blood pressure 136/67, pulse 91, temperature 98.2 ?F (36.8 ?C), temperature source Oral, resp. rate 18, height '5\' 3"'$  (1.6 m), weight 148 lb (67.1 kg), SpO2 100 %. ?  ? ?HEENT: No thrush or ulcers ?Resp: Lungs clear bilaterally, no respiratory distress ?Cardio: Regular rate and rhythm ?GI: No hepatosplenomegaly, nontender ?Vascular: No leg edema ?  ? ?Portacath/PICC-without erythema ? ?Lab Results: ? ?Lab Results  ?Component Value Date  ? WBC 12.3 (H) 07/06/2021  ? HGB 12.1 07/06/2021  ? HCT 37.2 07/06/2021  ? MCV 94.2 07/06/2021  ? PLT 217 07/06/2021  ? NEUTROABS 9.0 (H) 07/06/2021  ? ? ?CMP  ?Lab Results  ?Component Value Date  ? NA 135 07/06/2021  ? K 4.2 07/06/2021  ? CL 103 07/06/2021  ? CO2 21 (L) 07/06/2021  ? GLUCOSE 211 (H) 07/06/2021  ? BUN 16 07/06/2021  ? CREATININE 0.71 07/06/2021  ? CALCIUM 10.2 07/06/2021  ? PROT 7.1 07/06/2021  ? ALBUMIN 3.9 07/06/2021  ? AST 162 (H) 07/06/2021  ? ALT 93 (H) 07/06/2021  ? ALKPHOS 314 (H) 07/06/2021  ? BILITOT 1.1 07/06/2021  ? GFRNONAA >60 07/06/2021  ? GFRAA 71 02/02/2019  ? ? ?Lab Results  ?Component Value Date  ? XIH038 3,164 (H) 06/08/2021  ? ? ?Medications: I have reviewed  the patient's current medications. ? ? ?Assessment/Plan: ? ?Adenocarcinoma the pancreas body, stage IV (cT4,cN0,pM1) ?Ultrasound abdomen 08/18/2020-possible hypoechoic pancreas body mass, small hypoechoic liver areas ?MRI abdomen 08/27/2020-pancreas body mass, multiple hepatic lesions consistent with metastases, right abdominal omental nodularity, pancreas mass extends to the celiac bifurcation and abuts the splenic vein and splenoportal confluence ?CT chest 09/07/2020-scattered pulmonary nodules concerning for metastases, pancreas body mass, hepatic metastases ?Ultrasound-guided biopsy of a right liver lesion 09/08/2020-adenocarcinoma consistent with a pancreas primary ?CT abdomen/pelvis 09/24/2020-pancreas neck mass, omental and peritoneal nodularity-unchanged, multiple hypoenhancing liver masses-unchanged, numerous small pulmonary nodules ?Cycle 1 gemcitabine/Abraxane 10/04/2020 ?Cycle 2 gemcitabine/Abraxane 10/27/2020 ?Cycle 3 gemcitabine/Abraxane 11/11/2018 ?Cycle 4 gemcitabine/Abraxane 11/23/2020 ?Cycle 5 gemcitabine/Abraxane 12/08/2020 ?CT abdomen/pelvis 12/18/2020-stable pancreas mass, stable and improved liver lesions, resolution of omental soft tissue density, stable indeterminate lung nodules, no disease progression ?Cycle 6 gemcitabine/Abraxane 12/22/2020 ?Cycle 7 gemcitabine 01/05/2021, 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  ?Cycle 8 gemcitabine/Abraxane 01/19/2021 ?Cycle 9 gemcitabine/Abraxane 02/01/2021 ?Cycle 10 Gemcitabine 02/16/2021, Abraxane held due to increased neuropathy ?Cycle 11 Gemcitabine 03/05/2021, 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 ?Cycle 12 gemcitabine 03/14/2021, 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 ?Cycle 13 gemcitabine 04/02/2021, 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 ?Cycle 14 gemcitabine/Abraxane 04/16/2021, Abraxane resumed at a reduced dose ?Cycle 15 gemcitabine/Abraxane 04/30/2021 ?CT abdomen/pelvis 05/08/2021-stable pancreas mass, liver metastases are slightly smaller, stable tiny bilateral lower lung nodules ?Cycle  16 gemcitabine/Abraxane 05/14/2021, Abraxane escalated back to 100 mg per metered  squared ?Cycle 17 gemcitabine/Abraxane 05/28/2021 ?Cycle 18 gemcitabine/Abraxane 06/11/2021 ?  ?2.  Pain secondary #1 ?3.  Diabetes ?4.  Hypertension ?5.  Osteoarthritis ?6.  Port-A-Cath placement 09/22/2020 ?7.  Admission with jaundice 09/24/2020.  MRI-new abrupt constriction of the common hepatic duct.  The infiltrative pancreatic mass extends medially in the porta hepatis to obstruct the common hepatic duct.  Multiple right hepatic lobe metastases mildly increased in size.  Stent placed into the common bile duct 09/28/2020. ?8.  COVID-19 06/20/2021, admission with COVID and bacterial pneumonia?  06/30/2021-completed course of antibiotics ?  ? ? ?Disposition: ?[name] appears well today.  She was recently diagnosed with COVID-19 and possible bacterial pneumonia.  She is completing an outpatient course of antibiotics.  She now feels well.  She is scheduled to resume gemcitabine/Abraxane on 07/09/2021. ? ?The liver enzymes are more elevated today.  This could be related to the recent COVID-19 infection, toxicity from polypharmacy, or pancreas cancer.  We will check a liver panel when she is here on 07/09/2021. ? ?[name] will return for an office visit 07/23/2021 with the plan to complete another cycle of chemotherapy on 07/24/2021. ? ?The CA 19-9 was stable on 06/08/2021. ? ?Betsy Coder, MD ? ?07/06/2021  ?1:02 PM ? ? ? ?

## 2021-07-07 LAB — CANCER ANTIGEN 19-9: CA 19-9: 5545 U/mL — ABNORMAL HIGH (ref 0–35)

## 2021-07-09 ENCOUNTER — Telehealth: Payer: Self-pay | Admitting: Oncology

## 2021-07-09 ENCOUNTER — Inpatient Hospital Stay: Payer: Medicare HMO

## 2021-07-09 VITALS — BP 111/74 | HR 98 | Temp 98.6°F | Resp 19

## 2021-07-09 DIAGNOSIS — C259 Malignant neoplasm of pancreas, unspecified: Secondary | ICD-10-CM

## 2021-07-09 DIAGNOSIS — Z5111 Encounter for antineoplastic chemotherapy: Secondary | ICD-10-CM | POA: Diagnosis not present

## 2021-07-09 DIAGNOSIS — Z8616 Personal history of COVID-19: Secondary | ICD-10-CM | POA: Diagnosis not present

## 2021-07-09 DIAGNOSIS — E119 Type 2 diabetes mellitus without complications: Secondary | ICD-10-CM | POA: Diagnosis not present

## 2021-07-09 DIAGNOSIS — I10 Essential (primary) hypertension: Secondary | ICD-10-CM | POA: Diagnosis not present

## 2021-07-09 DIAGNOSIS — G893 Neoplasm related pain (acute) (chronic): Secondary | ICD-10-CM | POA: Diagnosis not present

## 2021-07-09 DIAGNOSIS — M1990 Unspecified osteoarthritis, unspecified site: Secondary | ICD-10-CM | POA: Diagnosis not present

## 2021-07-09 LAB — CMP (CANCER CENTER ONLY)
ALT: 52 U/L — ABNORMAL HIGH (ref 0–44)
AST: 22 U/L (ref 15–41)
Albumin: 4 g/dL (ref 3.5–5.0)
Alkaline Phosphatase: 171 U/L — ABNORMAL HIGH (ref 38–126)
Anion gap: 10 (ref 5–15)
BUN: 20 mg/dL (ref 8–23)
CO2: 21 mmol/L — ABNORMAL LOW (ref 22–32)
Calcium: 9.6 mg/dL (ref 8.9–10.3)
Chloride: 101 mmol/L (ref 98–111)
Creatinine: 0.67 mg/dL (ref 0.44–1.00)
GFR, Estimated: >60 mL/min (ref >60)
Glucose, Bld: 248 mg/dL — ABNORMAL HIGH (ref 70–99)
Potassium: 4.1 mmol/L (ref 3.5–5.1)
Sodium: 132 mmol/L — ABNORMAL LOW (ref 135–145)
Total Bilirubin: 0.6 mg/dL (ref 0.3–1.2)
Total Protein: 7.2 g/dL (ref 6.5–8.1)

## 2021-07-09 MED ORDER — SODIUM CHLORIDE 0.9 % IV SOLN: INTRAVENOUS | Status: AC

## 2021-07-09 MED ORDER — PACLITAXEL PROTEIN-BOUND CHEMO INJECTION 100 MG
100.0000 mg/m2 | Freq: Once | INTRAVENOUS | Status: AC
  Administered 2021-07-09: 175 mg via INTRAVENOUS
  Filled 2021-07-09: qty 35

## 2021-07-09 MED ORDER — HEPARIN SOD (PORK) LOCK FLUSH 100 UNIT/ML IV SOLN
500.0000 [IU] | Freq: Once | INTRAVENOUS | Status: AC | PRN
  Administered 2021-07-09: 500 [IU]

## 2021-07-09 MED ORDER — PROCHLORPERAZINE MALEATE 10 MG PO TABS
10.0000 mg | ORAL_TABLET | Freq: Once | ORAL | Status: AC
  Administered 2021-07-09: 10 mg via ORAL
  Filled 2021-07-09: qty 1

## 2021-07-09 MED ORDER — SODIUM CHLORIDE 0.9 % IV SOLN
800.0000 mg/m2 | Freq: Once | INTRAVENOUS | Status: AC
  Administered 2021-07-09: 1444 mg via INTRAVENOUS
  Filled 2021-07-09: qty 26.3

## 2021-07-09 MED ORDER — SODIUM CHLORIDE 0.9% FLUSH
10.0000 mL | INTRAVENOUS | Status: DC | PRN
  Administered 2021-07-09: 10 mL

## 2021-07-09 MED ORDER — SODIUM CHLORIDE 0.9 % IV SOLN: Freq: Once | INTRAVENOUS | Status: AC

## 2021-07-09 NOTE — Patient Instructions (Signed)
[name]   ?Discharge Instructions: ?Thank you for choosing [facility] to provide your oncology and hematology care.  ? ?If you have a lab appointment with the [facility], please go directly to the [facility] and check in at the registration area. ?  ?Wear comfortable clothing and clothing appropriate for easy access to any Portacath or PICC line.  ? ?We strive to give you quality time with your provider. You may need to reschedule your appointment if you arrive late (15 or more minutes).  Arriving late affects you and other patients whose appointments are after yours.  Also, if you miss three or more appointments without notifying the office, you may be dismissed from the clinic at the provider?s discretion.    ?  ?For prescription refill requests, have your pharmacy contact our office and allow 72 hours for refills to be completed.   ? ?Today you received the following chemotherapy and/or immunotherapy agents Abraxane, Gemzar    ?  ?To help prevent nausea and vomiting after your treatment, we encourage you to take your nausea medication as directed. ? ?BELOW ARE SYMPTOMS THAT SHOULD BE REPORTED IMMEDIATELY: ?*FEVER GREATER THAN 100.4 F (38 ?C) OR HIGHER ?*CHILLS OR SWEATING ?*NAUSEA AND VOMITING THAT IS NOT CONTROLLED WITH YOUR NAUSEA MEDICATION ?*UNUSUAL SHORTNESS OF BREATH ?*UNUSUAL BRUISING OR BLEEDING ?*URINARY PROBLEMS (pain or burning when urinating, or frequent urination) ?*BOWEL PROBLEMS (unusual diarrhea, constipation, pain near the anus) ?TENDERNESS IN MOUTH AND THROAT WITH OR WITHOUT PRESENCE OF ULCERS (sore throat, sores in mouth, or a toothache) ?UNUSUAL RASH, SWELLING OR PAIN  ?UNUSUAL VAGINAL DISCHARGE OR ITCHING  ? ?Items with * indicate a potential emergency and should be followed up as soon as possible or go to the Emergency Department if any problems should occur. ? ?Please show the CHEMOTHERAPY ALERT CARD or IMMUNOTHERAPY ALERT CARD at  check-in to the Emergency Department and triage nurse. ? ?Should you have questions after your visit or need to cancel or reschedule your appointment, please contact [facility]  Dept: [phone]  and follow the prompts.  Office hours are 8:00 a.m. to 4:30 p.m. Monday - Friday. Please note that voicemails left after 4:00 p.m. may not be returned until the following business day.  We are closed weekends and major holidays. You have access to a nurse at all times for urgent questions. Please call the main number to the clinic Dept: [phone] and follow the prompts. ? ? ?For any non-urgent questions, you may also contact your provider using MyChart. We now offer e-Visits for anyone 78 and older to request care online for non-urgent symptoms. For details visit [email or web address]. ?  ?Also download the MyChart app! Go to the app store, search "MyChart", open the app, select [facility], and log in with your MyChart username and password. ? ?Due to Covid, a mask is required upon entering the hospital/clinic. If you do not have a mask, one will be given to you upon arrival. For doctor visits, patients may have 1 support person aged 42 or older with them. For treatment visits, patients cannot have anyone with them due to current Covid guidelines and our immunocompromised population.  ? ?Nanoparticle Albumin-Bound Paclitaxel injection ?What is this medication? ?NANOPARTICLE ALBUMIN-BOUND PACLITAXEL (Na no PAHR ti kuhl  al BYOO muhn-bound  PAK li TAX el) is a chemotherapy drug. It targets fast dividing cells, like cancer cells, and causes these cells to die. This medicine is used  to treat advanced breast cancer, lung cancer, and pancreatic cancer. ?This medicine may be used for other purposes; ask your health care provider or pharmacist if you have questions. ?COMMON BRAND NAME(S): Abraxane ?What should I tell my care team before I take this medication? ?They need to know if you have any  of these conditions: ?kidney disease ?liver disease ?low blood counts, like low white cell, platelet, or red cell counts ?lung or breathing disease, like asthma ?tingling of the fingers or toes, or other nerve disorder ?an unusual or allergic reaction to paclitaxel, albumin, other chemotherapy, other medicines, foods, dyes, or preservatives ?pregnant or trying to get pregnant ?breast-feeding ?How should I use this medication? ?This drug is given as an infusion into a vein. It is administered in a hospital or clinic by a specially trained health care professional. ?Talk to your pediatrician regarding the use of this medicine in children. Special care may be needed. ?Overdosage: If you think you have taken too much of this medicine contact a poison control center or emergency room at once. ?NOTE: This medicine is only for you. Do not share this medicine with others. ?What if I miss a dose? ?It is important not to miss your dose. Call your doctor or health care professional if you are unable to keep an appointment. ?What may interact with this medication? ?This medicine may interact with the following medications: ?antiviral medicines for hepatitis, HIV or AIDS ?certain antibiotics like erythromycin and clarithromycin ?certain medicines for fungal infections like ketoconazole and itraconazole ?certain medicines for seizures like carbamazepine, phenobarbital, phenytoin ?gemfibrozil ?nefazodone ?rifampin ?St. John's wort ?This list may not describe all possible interactions. Give your health care provider a list of all the medicines, herbs, non-prescription drugs, or dietary supplements you use. Also tell them if you smoke, drink alcohol, or use illegal drugs. Some items may interact with your medicine. ?What should I watch for while using this medication? ?Your condition will be monitored carefully while you are receiving this medicine. You will need important blood work done while you are taking this medicine. ?This  medicine can cause serious allergic reactions. If you experience allergic reactions like skin rash, itching or hives, swelling of the face, lips, or tongue, tell your doctor or health care professional right away. ?In some cases, you may be given additional medicines to help with side effects. Follow all directions for their use. ?This drug may make you feel generally unwell. This is not uncommon, as chemotherapy can affect healthy cells as well as cancer cells. Report any side effects. Continue your course of treatment even though you feel ill unless your doctor tells you to stop. ?Call your doctor or health care professional for advice if you get a fever, chills or sore throat, or other symptoms of a cold or flu. Do not treat yourself. This drug decreases your body's ability to fight infections. Try to avoid being around people who are sick. ?This medicine may increase your risk to bruise or bleed. Call your doctor or health care professional if you notice any unusual bleeding. ?Be careful brushing and flossing your teeth or using a toothpick because you may get an infection or bleed more easily. If you have any dental work done, tell your dentist you are receiving this medicine. ?Avoid taking products that contain aspirin, acetaminophen, ibuprofen, naproxen, or ketoprofen unless instructed by your doctor. These medicines may hide a fever. ?Do not become pregnant while taking this medicine or for 6 months after stopping it. Women should  inform their doctor if they wish to become pregnant or think they might be pregnant. Men should not father a child while taking this medicine or for 3 months after stopping it. There is a potential for serious side effects to an unborn child. Talk to your health care professional or pharmacist for more information. ?Do not breast-feed an infant while taking this medicine or for 2 weeks after stopping it. ?This medicine may interfere with the ability to get pregnant or to father a  child. You should talk to your doctor or health care professional if you are concerned about your fertility. ?What side effects may I notice from receiving this medication? ?Side effects that you should report t

## 2021-07-09 NOTE — Progress Notes (Signed)
Dr. Benay Spice aware of liver functions today. OK to proceed w/treatment. ?

## 2021-07-09 NOTE — Progress Notes (Signed)
Patient presents for treatment. RN assessment completed along with the following: ? ?Labs/vitals reviewed - Yes, and within treatment parameters.  CBC from 07/06/21 ?Weight within 10% of previous measurement - Yes ?Informed consent completed and reflects current therapy/intent - Yes, on date 10/12/20             ?Provider progress note reviewed - Patient not seen by provider today. Most recent note dated 04/07/21 reviewed. ?Treatment/Antibody/Supportive plan reviewed - Yes, and there are no adjustments needed for today's treatment. ?S&H and other orders reviewed - Yes, and there are no additional orders identified. ?Previous treatment date reviewed - Yes, and the appropriate amount of time has elapsed between treatments. ?Clinic Hand Off Received from - none ? ?Patient to proceed with treatment.  ? ?

## 2021-07-09 NOTE — Telephone Encounter (Signed)
Attempted to contact patient daughter to advise add on lab for today. No answer so voicemail was left. ?

## 2021-07-10 ENCOUNTER — Encounter: Payer: Self-pay | Admitting: Family Medicine

## 2021-07-10 ENCOUNTER — Ambulatory Visit (INDEPENDENT_AMBULATORY_CARE_PROVIDER_SITE_OTHER): Payer: Medicare HMO | Admitting: Family Medicine

## 2021-07-10 VITALS — BP 136/80 | HR 98 | Temp 98.3°F | Ht 63.0 in | Wt 148.6 lb

## 2021-07-10 DIAGNOSIS — E538 Deficiency of other specified B group vitamins: Secondary | ICD-10-CM | POA: Diagnosis not present

## 2021-07-10 DIAGNOSIS — C259 Malignant neoplasm of pancreas, unspecified: Secondary | ICD-10-CM | POA: Diagnosis not present

## 2021-07-10 DIAGNOSIS — R748 Abnormal levels of other serum enzymes: Secondary | ICD-10-CM | POA: Diagnosis not present

## 2021-07-10 DIAGNOSIS — E119 Type 2 diabetes mellitus without complications: Secondary | ICD-10-CM

## 2021-07-10 DIAGNOSIS — I10 Essential (primary) hypertension: Secondary | ICD-10-CM | POA: Diagnosis not present

## 2021-07-10 LAB — POCT GLYCOSYLATED HEMOGLOBIN (HGB A1C): Hemoglobin A1C: 6.3 % — AB (ref 4.0–5.6)

## 2021-07-10 NOTE — Progress Notes (Signed)
? ?Subjective  ?CC:  ?Chief Complaint  ?Patient presents with  ? Hospitalization Follow-up  ?  Pt is here for a hospital F/U from Pneumonia due to COVID-19 virus. Still having some weakness and just chemo yesterday.  ? ? ?HPI: [name] is a 76 y.o. female who presents to the office today to address the problems listed above in the chief complaint. ?76 year old with metastatic pancreatic cancer s/p chemotherapy yesterday.  Also here for follow-up after COVID-pneumonia.  I reviewed all notes and records.  Overall, she recovered well from her pneumonia.  However feeling a bit weak today after her chemotherapy. ?Elevated liver test: Recheck yesterday and are trending downwards. ?Diet-controlled diabetes: Appetite is variable.  No symptoms of hypoglycemia. ?Hypertension has been stable on current medical regimen.  No chest pain. ?Of note, her vitamin B12 level had been very high at her hospitalization.  She is on monthly injections. ? ?Assessment  ?1. 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)   ?2. Pancreatic adenocarcinoma ([place])   ?3. Essential hypertension   ?4. Diet-controlled diabetes mellitus ([place])   ?5. Elevated liver enzymes   ?6. Vitamin B12 deficiency   ? ?  ?Plan  ?Pancreatic cancer per oncology ?Hypertension is stable ?Diet-controlled diabetes: Stable.  Work on good nutrition, high-protein. ?Elevated liver enzymes due to cancer, COVID and/or chemotherapy: Improving.  Oncology monitoring. ?Vitamin B12 deficiency: Will hold vitamin B12 injections for the next 2 to 3 months and then recheck levels.  Patient agrees ? ?Follow up: Return in about 3 months (around 10/09/2021) for recheck.  ? ? ?Orders Placed This Encounter  ?Procedures  ? POCT HgB A1C  ? ?No orders of the defined types were placed in this encounter. ? ?  ? ?I reviewed the patients updated PMH, FH, and SocHx.  ?  ?Patient Active Problem List  ? Diagnosis Date Noted  ? GAD (generalized anxiety disorder) 10/20/2019  ?   Priority: High  ? Diet-controlled diabetes mellitus ([place]) 10/20/2019  ?  Priority: High  ? Essential tremor 04/13/2019  ?  Priority: High  ? Acquired hypothyroidism 05/24/2012  ?  Priority: High  ? Alcohol abuse, daily use 05/24/2012  ?  Priority: High  ? Essential hypertension   ?  Priority: High  ? Osteoarthritis, multiple sites 10/20/2019  ?  Priority: Medium   ? Bilateral primary osteoarthritis of knee 09/23/2017  ?  Priority: Medium   ? Vitamin B12 deficiency 10/20/2019  ?  Priority: Low  ? Presbycusis of both ears 04/13/2019  ?  Priority: Low  ? Pancreatic adenocarcinoma ([place]) 09/25/2020  ?  Priority: 1.  ? 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  Priority: 1.  ? Elevated liver enzymes 06/30/2021  ? Hyperbilirubinemia 09/24/2020  ? ?Current Meds  ?Medication Sig  ? ACCU-CHEK AVIVA PLUS test strip As needed (Patient taking differently: 1 each by Other route See admin instructions. As needed)  ? Accu-Chek Softclix Lancets lancets As needed (Patient taking differently: 1 each by Other route See admin instructions. As needed)  ? Alcohol Swabs PADS USE AS DIRECTED (Patient taking differently: 1 each by Other route See admin instructions. USE AS DIRECTED)  ? ALPRAZolam (XANAX) 0.5 MG tablet Take 1 tablet (0.5 mg total) by mouth daily as needed for anxiety.  ? aspirin EC 81 MG tablet Take 81 mg by mouth every morning.  ? Blood Glucose Monitoring Suppl (ACCU-CHEK GUIDE) w/Device KIT As needed (Patient taking differently: 1 each by  Other route See admin instructions. As needed)  ? cyanocobalamin (,VITAMIN B-12,) 1000 MCG/ML injection Inject 1 mL (1,000 mcg total) into the muscle every 30 (thirty) days.  ? docusate sodium (COLACE) 100 MG capsule Take 100 mg by mouth 2 (two) times daily as needed (constipation).  ? levothyroxine (SYNTHROID) 125 MCG tablet TAKE 1 TABLET ON AN EMPTY STOMACH IN THE MORNING (Patient taking differently: Take 125 mcg by mouth See admin instructions. TAKE 1  TABLET ON AN EMPTY STOMACH IN THE MORNING)  ? lidocaine-prilocaine (EMLA) cream APPLY 1 APPLICATION TOPICALLY AS NEEDED.  ? omeprazole (PRILOSEC) 20 MG capsule TAKE 1 CAPSULE BY MOUTH EVERY DAY (Patient taking differently: Take 20 mg by mouth daily.)  ? prochlorperazine (COMPAZINE) 10 MG tablet Take 1 tablet (10 mg total) by mouth every 6 (six) hours as needed (Nausea or vomiting).  ? ? ?Allergies: ?Patient is allergic to penicillins. ?Family History: ?Patient family history includes Alcohol abuse in her brother; Alzheimer's disease in her mother; COPD in her father; Cancer in an other family member; Dementia in her mother; Diabetes in her mother; Emphysema in her brother and father; Heart attack in her daughter; Heart disease in her brother and sister. ?Social History:  ?Patient  reports that she quit smoking about 34 years ago. Her smoking use included cigarettes. She has a 10.00 pack-year smoking history. She has never used smokeless tobacco. She reports current alcohol use of about 21.0 standard drinks per week. She reports that she does not use drugs. ? ?Review of Systems: ?Constitutional: Negative for fever malaise or anorexia ?Cardiovascular: negative for chest pain ?Respiratory: negative for SOB or persistent cough ?Gastrointestinal: negative for abdominal pain ? ?Objective  ?Vitals: BP 136/80   Pulse 98   Temp 98.3 ?F (36.8 ?C)   Ht '5\' 3"'  (1.6 m)   Wt 148 lb 9.6 oz (67.4 kg)   SpO2 97%   BMI 26.32 kg/m?  ?General: no acute distress , A&Ox3, appears well ?HEENT: PEERL, conjunctiva normal, neck is supple ?Cardiovascular:  RRR without murmur or gallop.  ?Respiratory:  Good breath sounds bilaterally, CTAB with normal respiratory effort ?Skin:  Warm, no rashes ? ? ? ?Commons side effects, risks, benefits, and alternatives for medications and treatment plan prescribed today were discussed, and the patient expressed understanding of the given instructions. Patient is instructed to call or message via MyChart  if he/she has any questions or concerns regarding our treatment plan. No barriers to understanding were identified. We discussed Red Flag symptoms and signs in detail. Patient expressed understanding regarding what to do in case of urgent or emergency type symptoms.  ?Medication list was reconciled, printed and provided to the patient in AVS. Patient instructions and summary information was reviewed with the patient as documented in the AVS. ?This note was prepared with assistance of Systems analyst. Occasional wrong-word or sound-a-like substitutions may have occurred due to the inherent limitations of voice recognition software ? ?This visit occurred during the SARS-CoV-2 public health emergency.  Safety protocols were in place, including screening questions prior to the visit, additional usage of staff PPE, and extensive cleaning of exam room while observing appropriate contact time as indicated for disinfecting solutions.  ? ?

## 2021-07-10 NOTE — Patient Instructions (Addendum)
Please cancel your appointment with me that is scheduled in May and return in 3 months for recheck.  ? ?Please hold off on vit b12 injections until July or August. We will recheck your levels at your next office visit.  ? ?If you have any questions or concerns, please don't hesitate to send me a message via MyChart or call the office at [phone]. Thank you for visiting with [place] today! It's our pleasure caring for you.  ?

## 2021-07-11 ENCOUNTER — Ambulatory Visit: Payer: Medicare HMO | Admitting: Family Medicine

## 2021-07-11 ENCOUNTER — Encounter: Payer: Self-pay | Admitting: *Deleted

## 2021-07-11 ENCOUNTER — Telehealth: Payer: Self-pay

## 2021-07-11 NOTE — Telephone Encounter (Signed)
TC from Pt stating after her treatment she is having pain under her rib cage. Asked Pt if she took anything for pain. Pt stated NO. Pt stated the pain is mostly when she coughs. Informed Pt I would discuss with Dr Benay Spice and return her call. ?

## 2021-07-11 NOTE — Progress Notes (Signed)
Spoke with [name] regarding abdominal pain, she describes no fever, pain in abdomen right side, she has had 3 bowel movements today that she describes as soft and she took colace this am.  ?She describes decreased appetite, that she ate big bowl of grits this am. ?We discussed eating strategy of eating every 3-4 hours small amount with protein for blood glucose stabilization and to take antacid such as Tums for abdominal discomfort. ?She met with PCP yesterday.  ? ?Instructed her to call if no improvement. ?

## 2021-07-18 ENCOUNTER — Encounter (HOSPITAL_COMMUNITY): Payer: Self-pay

## 2021-07-18 ENCOUNTER — Emergency Department (HOSPITAL_COMMUNITY): Payer: Medicare HMO

## 2021-07-18 ENCOUNTER — Telehealth: Payer: Self-pay

## 2021-07-18 ENCOUNTER — Other Ambulatory Visit: Payer: Self-pay

## 2021-07-18 ENCOUNTER — Inpatient Hospital Stay (HOSPITAL_COMMUNITY)
Admission: EM | Admit: 2021-07-18 | Discharge: 2021-08-01 | DRG: 871 | Disposition: A | Discharge status: Skilled Nursing Facility | Payer: Medicare HMO | Attending: Internal Medicine | Admitting: Internal Medicine

## 2021-07-18 DIAGNOSIS — N281 Cyst of kidney, acquired: Secondary | ICD-10-CM | POA: Diagnosis not present

## 2021-07-18 DIAGNOSIS — R918 Other nonspecific abnormal finding of lung field: Secondary | ICD-10-CM | POA: Diagnosis not present

## 2021-07-18 DIAGNOSIS — K819 Cholecystitis, unspecified: Principal | ICD-10-CM

## 2021-07-18 DIAGNOSIS — J9811 Atelectasis: Secondary | ICD-10-CM | POA: Diagnosis not present

## 2021-07-18 DIAGNOSIS — E871 Hypo-osmolality and hyponatremia: Secondary | ICD-10-CM | POA: Diagnosis present

## 2021-07-18 DIAGNOSIS — K810 Acute cholecystitis: Secondary | ICD-10-CM | POA: Diagnosis not present

## 2021-07-18 DIAGNOSIS — E119 Type 2 diabetes mellitus without complications: Secondary | ICD-10-CM | POA: Diagnosis not present

## 2021-07-18 DIAGNOSIS — E785 Hyperlipidemia, unspecified: Secondary | ICD-10-CM | POA: Diagnosis present

## 2021-07-18 DIAGNOSIS — Z825 Family history of asthma and other chronic lower respiratory diseases: Secondary | ICD-10-CM

## 2021-07-18 DIAGNOSIS — R6520 Severe sepsis without septic shock: Secondary | ICD-10-CM | POA: Diagnosis present

## 2021-07-18 DIAGNOSIS — Z79890 Hormone replacement therapy (postmenopausal): Secondary | ICD-10-CM

## 2021-07-18 DIAGNOSIS — K651 Peritoneal abscess: Secondary | ICD-10-CM | POA: Diagnosis not present

## 2021-07-18 DIAGNOSIS — K219 Gastro-esophageal reflux disease without esophagitis: Secondary | ICD-10-CM | POA: Diagnosis present

## 2021-07-18 DIAGNOSIS — R109 Unspecified abdominal pain: Secondary | ICD-10-CM | POA: Diagnosis not present

## 2021-07-18 DIAGNOSIS — I82413 Acute embolism and thrombosis of femoral vein, bilateral: Secondary | ICD-10-CM | POA: Diagnosis not present

## 2021-07-18 DIAGNOSIS — I2510 Atherosclerotic heart disease of native coronary artery without angina pectoris: Secondary | ICD-10-CM | POA: Diagnosis not present

## 2021-07-18 DIAGNOSIS — G9341 Metabolic encephalopathy: Secondary | ICD-10-CM | POA: Diagnosis present

## 2021-07-18 DIAGNOSIS — Z8673 Personal history of transient ischemic attack (TIA), and cerebral infarction without residual deficits: Secondary | ICD-10-CM

## 2021-07-18 DIAGNOSIS — N390 Urinary tract infection, site not specified: Secondary | ICD-10-CM | POA: Diagnosis present

## 2021-07-18 DIAGNOSIS — R531 Weakness: Secondary | ICD-10-CM | POA: Diagnosis not present

## 2021-07-18 DIAGNOSIS — D72829 Elevated white blood cell count, unspecified: Secondary | ICD-10-CM | POA: Diagnosis not present

## 2021-07-18 DIAGNOSIS — R0602 Shortness of breath: Secondary | ICD-10-CM | POA: Diagnosis not present

## 2021-07-18 DIAGNOSIS — D61810 Antineoplastic chemotherapy induced pancytopenia: Secondary | ICD-10-CM | POA: Diagnosis present

## 2021-07-18 DIAGNOSIS — R1312 Dysphagia, oropharyngeal phase: Secondary | ICD-10-CM | POA: Diagnosis not present

## 2021-07-18 DIAGNOSIS — R846 Abnormal cytological findings in specimens from respiratory organs and thorax: Secondary | ICD-10-CM | POA: Diagnosis not present

## 2021-07-18 DIAGNOSIS — F411 Generalized anxiety disorder: Secondary | ICD-10-CM | POA: Diagnosis not present

## 2021-07-18 DIAGNOSIS — U071 COVID-19: Secondary | ICD-10-CM | POA: Diagnosis not present

## 2021-07-18 DIAGNOSIS — I10 Essential (primary) hypertension: Secondary | ICD-10-CM | POA: Diagnosis not present

## 2021-07-18 DIAGNOSIS — K750 Abscess of liver: Secondary | ICD-10-CM | POA: Diagnosis not present

## 2021-07-18 DIAGNOSIS — Z8616 Personal history of COVID-19: Secondary | ICD-10-CM | POA: Diagnosis not present

## 2021-07-18 DIAGNOSIS — E1165 Type 2 diabetes mellitus with hyperglycemia: Secondary | ICD-10-CM | POA: Diagnosis present

## 2021-07-18 DIAGNOSIS — I700 Atherosclerosis of aorta: Secondary | ICD-10-CM | POA: Diagnosis not present

## 2021-07-18 DIAGNOSIS — I2693 Single subsegmental pulmonary embolism without acute cor pulmonale: Secondary | ICD-10-CM | POA: Diagnosis not present

## 2021-07-18 DIAGNOSIS — C259 Malignant neoplasm of pancreas, unspecified: Secondary | ICD-10-CM | POA: Diagnosis not present

## 2021-07-18 DIAGNOSIS — Z811 Family history of alcohol abuse and dependence: Secondary | ICD-10-CM

## 2021-07-18 DIAGNOSIS — A419 Sepsis, unspecified organism: Secondary | ICD-10-CM

## 2021-07-18 DIAGNOSIS — Z8507 Personal history of malignant neoplasm of pancreas: Secondary | ICD-10-CM | POA: Diagnosis not present

## 2021-07-18 DIAGNOSIS — Z87891 Personal history of nicotine dependence: Secondary | ICD-10-CM

## 2021-07-18 DIAGNOSIS — C787 Secondary malignant neoplasm of liver and intrahepatic bile duct: Secondary | ICD-10-CM | POA: Diagnosis present

## 2021-07-18 DIAGNOSIS — R279 Unspecified lack of coordination: Secondary | ICD-10-CM | POA: Diagnosis not present

## 2021-07-18 DIAGNOSIS — Z7401 Bed confinement status: Secondary | ICD-10-CM | POA: Diagnosis not present

## 2021-07-18 DIAGNOSIS — Z820 Family history of epilepsy and other diseases of the nervous system: Secondary | ICD-10-CM

## 2021-07-18 DIAGNOSIS — Z7189 Other specified counseling: Secondary | ICD-10-CM

## 2021-07-18 DIAGNOSIS — Z8249 Family history of ischemic heart disease and other diseases of the circulatory system: Secondary | ICD-10-CM

## 2021-07-18 DIAGNOSIS — Z79899 Other long term (current) drug therapy: Secondary | ICD-10-CM

## 2021-07-18 DIAGNOSIS — M6281 Muscle weakness (generalized): Secondary | ICD-10-CM | POA: Diagnosis not present

## 2021-07-18 DIAGNOSIS — M1990 Unspecified osteoarthritis, unspecified site: Secondary | ICD-10-CM | POA: Diagnosis present

## 2021-07-18 DIAGNOSIS — Z7982 Long term (current) use of aspirin: Secondary | ICD-10-CM

## 2021-07-18 DIAGNOSIS — J90 Pleural effusion, not elsewhere classified: Secondary | ICD-10-CM | POA: Diagnosis not present

## 2021-07-18 DIAGNOSIS — I119 Hypertensive heart disease without heart failure: Secondary | ICD-10-CM | POA: Diagnosis present

## 2021-07-18 DIAGNOSIS — R000 Tachycardia, unspecified: Secondary | ICD-10-CM | POA: Diagnosis not present

## 2021-07-18 DIAGNOSIS — R509 Fever, unspecified: Secondary | ICD-10-CM | POA: Diagnosis not present

## 2021-07-18 DIAGNOSIS — A4151 Sepsis due to Escherichia coli [E. coli]: Secondary | ICD-10-CM | POA: Diagnosis not present

## 2021-07-18 DIAGNOSIS — I2699 Other pulmonary embolism without acute cor pulmonale: Secondary | ICD-10-CM | POA: Diagnosis not present

## 2021-07-18 DIAGNOSIS — K5900 Constipation, unspecified: Secondary | ICD-10-CM | POA: Diagnosis not present

## 2021-07-18 DIAGNOSIS — E039 Hypothyroidism, unspecified: Secondary | ICD-10-CM | POA: Diagnosis not present

## 2021-07-18 DIAGNOSIS — R41841 Cognitive communication deficit: Secondary | ICD-10-CM | POA: Diagnosis not present

## 2021-07-18 DIAGNOSIS — R0689 Other abnormalities of breathing: Secondary | ICD-10-CM | POA: Diagnosis not present

## 2021-07-18 DIAGNOSIS — K5730 Diverticulosis of large intestine without perforation or abscess without bleeding: Secondary | ICD-10-CM | POA: Diagnosis not present

## 2021-07-18 DIAGNOSIS — R410 Disorientation, unspecified: Secondary | ICD-10-CM | POA: Diagnosis not present

## 2021-07-18 DIAGNOSIS — K8010 Calculus of gallbladder with chronic cholecystitis without obstruction: Secondary | ICD-10-CM | POA: Diagnosis not present

## 2021-07-18 DIAGNOSIS — K7689 Other specified diseases of liver: Secondary | ICD-10-CM | POA: Diagnosis not present

## 2021-07-18 DIAGNOSIS — Z880 Allergy status to penicillin: Secondary | ICD-10-CM

## 2021-07-18 DIAGNOSIS — Z833 Family history of diabetes mellitus: Secondary | ICD-10-CM

## 2021-07-18 DIAGNOSIS — E876 Hypokalemia: Secondary | ICD-10-CM | POA: Diagnosis not present

## 2021-07-18 DIAGNOSIS — K828 Other specified diseases of gallbladder: Secondary | ICD-10-CM | POA: Diagnosis not present

## 2021-07-18 LAB — COMPREHENSIVE METABOLIC PANEL
ALT: 22 U/L (ref 0–44)
AST: 23 U/L (ref 15–41)
Albumin: 2.7 g/dL — ABNORMAL LOW (ref 3.5–5.0)
Alkaline Phosphatase: 138 U/L — ABNORMAL HIGH (ref 38–126)
Anion gap: 11 (ref 5–15)
BUN: 11 mg/dL (ref 8–23)
CO2: 16 mmol/L — ABNORMAL LOW (ref 22–32)
Calcium: 8.4 mg/dL — ABNORMAL LOW (ref 8.9–10.3)
Chloride: 102 mmol/L (ref 98–111)
Creatinine, Ser: 0.77 mg/dL (ref 0.44–1.00)
GFR, Estimated: >60 mL/min (ref >60)
Glucose, Bld: 191 mg/dL — ABNORMAL HIGH (ref 70–99)
Potassium: 3.6 mmol/L (ref 3.5–5.1)
Sodium: 129 mmol/L — ABNORMAL LOW (ref 135–145)
Total Bilirubin: 1.2 mg/dL (ref 0.3–1.2)
Total Protein: 6.2 g/dL — ABNORMAL LOW (ref 6.5–8.1)

## 2021-07-18 LAB — MAGNESIUM: Magnesium: 1.4 mg/dL — ABNORMAL LOW (ref 1.7–2.4)

## 2021-07-18 LAB — CBG MONITORING, ED
Glucose-Capillary: 186 mg/dL — ABNORMAL HIGH (ref 70–99)
Glucose-Capillary: 218 mg/dL — ABNORMAL HIGH (ref 70–99)

## 2021-07-18 LAB — URINALYSIS, ROUTINE W REFLEX MICROSCOPIC
Bilirubin Urine: NEGATIVE
Glucose, UA: NEGATIVE mg/dL
Hgb urine dipstick: NEGATIVE
Ketones, ur: 20 mg/dL — AB
Nitrite: NEGATIVE
Protein, ur: NEGATIVE mg/dL
Specific Gravity, Urine: 1.019 (ref 1.005–1.030)
pH: 5 (ref 5.0–8.0)

## 2021-07-18 LAB — CBC WITH DIFFERENTIAL/PLATELET
Abs Immature Granulocytes: 0.08 10*3/uL — ABNORMAL HIGH (ref 0.00–0.07)
Basophils Absolute: 0 10*3/uL (ref 0.0–0.1)
Basophils Relative: 0 %
Eosinophils Absolute: 0 10*3/uL (ref 0.0–0.5)
Eosinophils Relative: 0 %
HCT: 31.7 % — ABNORMAL LOW (ref 36.0–46.0)
Hemoglobin: 10.9 g/dL — ABNORMAL LOW (ref 12.0–15.0)
Immature Granulocytes: 1 %
Lymphocytes Relative: 7 %
Lymphs Abs: 0.6 10*3/uL — ABNORMAL LOW (ref 0.7–4.0)
MCH: 31.5 pg (ref 26.0–34.0)
MCHC: 34.4 g/dL (ref 30.0–36.0)
MCV: 91.6 fL (ref 80.0–100.0)
Monocytes Absolute: 0.4 10*3/uL (ref 0.1–1.0)
Monocytes Relative: 5 %
Neutro Abs: 6.6 10*3/uL (ref 1.7–7.7)
Neutrophils Relative %: 87 %
Platelets: 121 10*3/uL — ABNORMAL LOW (ref 150–400)
RBC: 3.46 MIL/uL — ABNORMAL LOW (ref 3.87–5.11)
RDW: 14.6 % (ref 11.5–15.5)
WBC: 7.7 10*3/uL (ref 4.0–10.5)
nRBC: 0 % (ref 0.0–0.2)

## 2021-07-18 LAB — LACTIC ACID, PLASMA
Lactic Acid, Venous: 1.6 mmol/L (ref 0.5–1.9)
Lactic Acid, Venous: 2.4 mmol/L (ref 0.5–1.9)

## 2021-07-18 LAB — RESP PANEL BY RT-PCR (FLU A&B, COVID) ARPGX2
Influenza A by PCR: NEGATIVE
Influenza B by PCR: NEGATIVE
SARS Coronavirus 2 by RT PCR: POSITIVE — AB

## 2021-07-18 LAB — PROTIME-INR
INR: 1.2 (ref 0.8–1.2)
Prothrombin Time: 15.3 seconds — ABNORMAL HIGH (ref 11.4–15.2)

## 2021-07-18 LAB — APTT: aPTT: 28 seconds (ref 24–36)

## 2021-07-18 MED ORDER — IOHEXOL 350 MG/ML SOLN
80.0000 mL | Freq: Once | INTRAVENOUS | Status: AC | PRN
Start: 2021-07-18 — End: 2021-07-18
  Administered 2021-07-18: 80 mL via INTRAVENOUS

## 2021-07-18 MED ORDER — SODIUM CHLORIDE 0.9 % IV SOLN: 2.0000 g | Freq: Two times a day (BID) | INTRAVENOUS | Status: DC

## 2021-07-18 MED ORDER — LACTATED RINGERS IV BOLUS (SEPSIS)
1000.0000 mL | Freq: Once | INTRAVENOUS | Status: AC
  Administered 2021-07-18: 1000 mL via INTRAVENOUS

## 2021-07-18 MED ORDER — LORAZEPAM 2 MG/ML IJ SOLN
0.5000 mg | Freq: Four times a day (QID) | INTRAMUSCULAR | Status: DC | PRN
  Filled 2021-07-18: qty 1

## 2021-07-18 MED ORDER — SODIUM CHLORIDE 0.9 % IV SOLN
2.0000 g | Freq: Once | INTRAVENOUS | Status: DC
  Administered 2021-07-18: 2 g via INTRAVENOUS
  Filled 2021-07-18: qty 10

## 2021-07-18 MED ORDER — LACTATED RINGERS IV SOLN: INTRAVENOUS | Status: AC

## 2021-07-18 MED ORDER — ACETAMINOPHEN 650 MG RE SUPP: 650.0000 mg | Freq: Four times a day (QID) | RECTAL | Status: DC | PRN

## 2021-07-18 MED ORDER — ACETAMINOPHEN 500 MG PO TABS
1000.0000 mg | ORAL_TABLET | Freq: Once | ORAL | Status: DC
Start: 2021-07-18 — End: 2021-07-18

## 2021-07-18 MED ORDER — ONDANSETRON HCL 4 MG/2ML IJ SOLN
4.0000 mg | Freq: Once | INTRAMUSCULAR | Status: AC
  Administered 2021-07-18: 4 mg via INTRAVENOUS
  Filled 2021-07-18: qty 2

## 2021-07-18 MED ORDER — MORPHINE SULFATE (PF) 4 MG/ML IV SOLN
4.0000 mg | Freq: Once | INTRAVENOUS | Status: AC
  Administered 2021-07-18: 4 mg via INTRAVENOUS
  Filled 2021-07-18: qty 1

## 2021-07-18 MED ORDER — NALOXONE HCL 0.4 MG/ML IJ SOLN
0.4000 mg | INTRAMUSCULAR | Status: DC | PRN
Start: 2021-07-18 — End: 2021-08-01

## 2021-07-18 MED ORDER — METRONIDAZOLE 500 MG/100ML IV SOLN
500.0000 mg | Freq: Once | INTRAVENOUS | Status: AC
  Administered 2021-07-18: 500 mg via INTRAVENOUS
  Filled 2021-07-18: qty 100

## 2021-07-18 MED ORDER — VANCOMYCIN HCL 1500 MG/300ML IV SOLN
1500.0000 mg | Freq: Once | INTRAVENOUS | Status: AC
  Administered 2021-07-18: 1500 mg via INTRAVENOUS
  Filled 2021-07-18: qty 300

## 2021-07-18 MED ORDER — LORAZEPAM 2 MG/ML IJ SOLN
0.5000 mg | Freq: Once | INTRAMUSCULAR | Status: AC
Start: 2021-07-18 — End: 2021-07-18
  Administered 2021-07-18: 0.5 mg via INTRAVENOUS
  Filled 2021-07-18: qty 1

## 2021-07-18 MED ORDER — ONDANSETRON HCL 4 MG/2ML IJ SOLN: 4.0000 mg | Freq: Four times a day (QID) | INTRAMUSCULAR | Status: DC | PRN

## 2021-07-18 MED ORDER — HEPARIN SODIUM (PORCINE) 5000 UNIT/ML IJ SOLN
5000.0000 [IU] | Freq: Three times a day (TID) | INTRAMUSCULAR | Status: DC
  Administered 2021-07-19 – 2021-07-24 (×17): 5000 [IU] via SUBCUTANEOUS
  Filled 2021-07-18 (×17): qty 1

## 2021-07-18 MED ORDER — METRONIDAZOLE 500 MG/100ML IV SOLN
500.0000 mg | Freq: Two times a day (BID) | INTRAVENOUS | Status: DC
  Administered 2021-07-19: 500 mg via INTRAVENOUS
  Filled 2021-07-18: qty 100

## 2021-07-18 MED ORDER — ACETAMINOPHEN 325 MG PO TABS: 650.0000 mg | ORAL_TABLET | Freq: Four times a day (QID) | ORAL | Status: DC | PRN

## 2021-07-18 MED ORDER — SODIUM CHLORIDE 0.9 % IV SOLN
2.0000 g | Freq: Two times a day (BID) | INTRAVENOUS | Status: DC
  Administered 2021-07-18: 2 g via INTRAVENOUS
  Filled 2021-07-18: qty 12.5

## 2021-07-18 MED ORDER — FENTANYL CITRATE PF 50 MCG/ML IJ SOSY
25.0000 ug | PREFILLED_SYRINGE | Freq: Once | INTRAMUSCULAR | Status: AC
  Administered 2021-07-18: 25 ug via INTRAVENOUS
  Filled 2021-07-18: qty 1

## 2021-07-18 MED ORDER — LACTATED RINGERS IV BOLUS (SEPSIS)
250.0000 mL | Freq: Once | INTRAVENOUS | Status: AC
  Administered 2021-07-18: 250 mL via INTRAVENOUS

## 2021-07-18 MED ORDER — FENTANYL CITRATE PF 50 MCG/ML IJ SOSY
25.0000 ug | PREFILLED_SYRINGE | INTRAMUSCULAR | Status: DC | PRN
  Administered 2021-07-18 – 2021-07-19 (×3): 25 ug via INTRAVENOUS
  Filled 2021-07-18 (×3): qty 1

## 2021-07-18 MED ORDER — VANCOMYCIN HCL IN DEXTROSE 1-5 GM/200ML-% IV SOLN: 1000.0000 mg | INTRAVENOUS | Status: DC

## 2021-07-18 MED ORDER — VANCOMYCIN HCL IN DEXTROSE 1-5 GM/200ML-% IV SOLN
1000.0000 mg | Freq: Once | INTRAVENOUS | Status: DC
Start: 2021-07-18 — End: 2021-07-18

## 2021-07-18 MED ORDER — VANCOMYCIN HCL 1250 MG/250ML IV SOLN: 1250.0000 mg | INTRAVENOUS | Status: DC

## 2021-07-18 MED ORDER — INSULIN ASPART 100 UNIT/ML IJ SOLN
0.0000 [IU] | Freq: Four times a day (QID) | INTRAMUSCULAR | Status: DC
  Administered 2021-07-18: 2 [IU] via SUBCUTANEOUS
  Administered 2021-07-19 – 2021-07-31 (×20): 1 [IU] via SUBCUTANEOUS

## 2021-07-18 NOTE — ED Notes (Signed)
[phone] pts daughter and POA would like to speak with nurse about pts disposition  ?

## 2021-07-18 NOTE — ED Provider Notes (Addendum)
?[facility] ?Provider Note ? ? ?CSN: [id number] ?Arrival date & time: 07/18/21  1447 ? ?  ? ?History ? ?Chief Complaint  ?Patient presents with  ? Code Sepsis  ? ? ?[name] is a 76 y.o. female. ? ?The history is provided by the patient, a relative and medical records. No language interpreter was used.  ? ?76 year old female significant history of diabetes, TIA, hypertension, thyroid disease, alcohol abuse brought here via EMS from home for evaluation of altered mental status.  History obtained through patient and through daughter who is at bedside.  Patient previously diagnosed with COVID-pneumonia and was hospitalized for several days several weeks ago.  She is trying to recover from her sickness.  She recently went to the beach and just got back home a few days ago.  She mention for more than a week she has been feeling increased fatigue, fever, cough, decrease in appetite and having dark urine.  No report of shortness of breath abdominal pain or burning urination.  No constipation or diarrhea.  Mild nausea without vomiting.  She is currently receiving chemo. ? ?Home Medications ?Prior to Admission medications   ?Medication Sig Start Date End Date Taking? Authorizing Provider  ?ACCU-CHEK AVIVA PLUS test strip As needed ?Patient taking differently: 1 each by Other route See admin instructions. As needed 05/09/21   [name], MD  ?Accu-Chek Softclix Lancets lancets As needed ?Patient taking differently: 1 each by Other route See admin instructions. As needed 04/23/21   [name], MD  ?albuterol (VENTOLIN HFA) 108 (90 Base) MCG/ACT inhaler Inhale 2 puffs into the lungs every 6 (six) hours as needed for wheezing or shortness of breath. ?Patient not taking: Reported on 07/10/2021 07/02/21   Flora Lipps, MD  ?Alcohol Swabs PADS USE AS DIRECTED ?Patient taking differently: 1 each by Other route See admin instructions. USE AS DIRECTED 05/18/21   [name], MD   ?ALPRAZolam Duanne Moron) 0.5 MG tablet Take 1 tablet (0.5 mg total) by mouth daily as needed for anxiety. 11/23/20   Owens Shark, NP  ?amLODipine (NORVASC) 5 MG tablet Take 5 mg by mouth daily. ?Patient not taking: Reported on 07/10/2021 03/30/21   [provider]  ?ascorbic acid (VITAMIN C) 500 MG tablet Take 1 tablet (500 mg total) by mouth daily. ?Patient not taking: Reported on 07/10/2021 07/03/21 08/02/21  Flora Lipps, MD  ?aspirin EC 81 MG tablet Take 81 mg by mouth every morning.    [provider]  ?Blood Glucose Monitoring Suppl (ACCU-CHEK GUIDE) w/Device KIT As needed ?Patient taking differently: 1 each by Other route See admin instructions. As needed 05/11/21   [name], MD  ?cyanocobalamin (,VITAMIN B-12,) 1000 MCG/ML injection Inject 1 mL (1,000 mcg total) into the muscle every 30 (thirty) days. 01/11/21   [name], MD  ?docusate sodium (COLACE) 100 MG capsule Take 100 mg by mouth 2 (two) times daily as needed (constipation).    [provider]  ?guaiFENesin-dextromethorphan (ROBITUSSIN DM) 100-10 MG/5ML syrup Take 10 mLs by mouth every 4 (four) hours as needed for cough. ?Patient not taking: Reported on 07/10/2021 07/02/21   Flora Lipps, MD  ?levothyroxine (SYNTHROID) 125 MCG tablet TAKE 1 TABLET ON AN EMPTY STOMACH IN THE MORNING ?Patient taking differently: Take 125 mcg by mouth See admin instructions. TAKE 1 TABLET ON AN EMPTY STOMACH IN THE MORNING 06/04/21   [name], MD  ?lidocaine-prilocaine (EMLA) cream APPLY 1 APPLICATION TOPICALLY AS  NEEDED. 07/04/21   Ladell Pier, MD  ?omeprazole (PRILOSEC) 20 MG capsule TAKE 1 CAPSULE BY MOUTH EVERY DAY ?Patient taking differently: Take 20 mg by mouth daily. 04/18/21   [name], MD  ?ondansetron (ZOFRAN) 8 MG tablet Take 1 tablet (8 mg total) by mouth 2 (two) times daily as needed (Nausea or vomiting). ?Patient not taking: Reported on 07/10/2021 09/12/20   Truitt Merle, MD  ?prochlorperazine (COMPAZINE) 10 MG  tablet Take 1 tablet (10 mg total) by mouth every 6 (six) hours as needed (Nausea or vomiting). 09/12/20   Truitt Merle, MD  ?zinc sulfate 220 (50 Zn) MG capsule Take 1 capsule (220 mg total) by mouth daily. ?Patient not taking: Reported on 07/06/2021 07/03/21 08/02/21  Flora Lipps, MD  ?   ? ?Allergies    ?Penicillins   ? ?Review of Systems   ?Review of Systems  ?All other systems reviewed and are negative. ? ?Physical Exam ?Updated Vital Signs ?BP 140/80 (BP Location: Right Arm)   Pulse (!) 136   Temp 100.3 ?F (37.9 ?C) (Oral)   Resp 18   SpO2 98%  ?Physical Exam ?Vitals and nursing note reviewed.  ?Constitutional:   ?   Appearance: She is well-developed.  ?   Comments: Ill-appearing female in some mild discomfort  ?HENT:  ?   Head: Atraumatic.  ?Eyes:  ?   Extraocular Movements: Extraocular movements intact.  ?   Conjunctiva/sclera: Conjunctivae normal.  ?   Pupils: Pupils are equal, round, and reactive to light.  ?Cardiovascular:  ?   Rate and Rhythm: Tachycardia present.  ?Pulmonary:  ?   Effort: Pulmonary effort is normal.  ?Abdominal:  ?   Palpations: Abdomen is soft.  ?   Tenderness: There is no abdominal tenderness.  ?Musculoskeletal:  ?   Cervical back: Normal range of motion and neck supple.  ?Skin: ?   General: Skin is warm.  ?   Findings: No rash.  ?Neurological:  ?   Comments: Slow to respond  ?Psychiatric:     ?   Mood and Affect: Mood normal.  ? ? ?ED Results / Procedures / Treatments   ?Labs ?(all labs ordered are listed, but only abnormal results are displayed) ?Labs Reviewed  ?RESP PANEL BY RT-PCR (FLU A&B, COVID) ARPGX2 - Abnormal; Notable for the following components:  ?    Result Value  ? SARS Coronavirus 2 by RT PCR POSITIVE (*)   ? All other components within normal limits  ?COMPREHENSIVE METABOLIC PANEL - Abnormal; Notable for the following components:  ? Sodium 129 (*)   ? CO2 16 (*)   ? Glucose, Bld 191 (*)   ? Calcium 8.4 (*)   ? Total Protein 6.2 (*)   ? Albumin 2.7 (*)   ? Alkaline  Phosphatase 138 (*)   ? All other components within normal limits  ?LACTIC ACID, PLASMA - Abnormal; Notable for the following components:  ? Lactic Acid, Venous 2.4 (*)   ? All other components within normal limits  ?CBC WITH DIFFERENTIAL/PLATELET - Abnormal; Notable for the following components:  ? RBC 3.46 (*)   ? Hemoglobin 10.9 (*)   ? HCT 31.7 (*)   ? Platelets 121 (*)   ? Lymphs Abs 0.6 (*)   ? Abs Immature Granulocytes 0.08 (*)   ? All other components within normal limits  ?PROTIME-INR - Abnormal; Notable for the following components:  ? Prothrombin Time 15.3 (*)   ? All other components within normal limits  ?  URINALYSIS, ROUTINE W REFLEX MICROSCOPIC - Abnormal; Notable for the following components:  ? APPearance HAZY (*)   ? Ketones, ur 20 (*)   ? Leukocytes,Ua TRACE (*)   ? Bacteria, UA RARE (*)   ? All other components within normal limits  ?CBG MONITORING, ED - Abnormal; Notable for the following components:  ? Glucose-Capillary 186 (*)   ? All other components within normal limits  ?CULTURE, BLOOD (ROUTINE X 2)  ?CULTURE, BLOOD (ROUTINE X 2)  ?URINE CULTURE  ?LACTIC ACID, PLASMA  ?APTT  ? ? ?EKG ?None ?ED ECG REPORT ? ? Date: 07/18/2021 ? Rate: 128 ? Rhythm: sinus tachycardia ? QRS Axis: normal ? Intervals: normal ? ST/T Wave abnormalities: nonspecific ST/T changes ? Conduction Disutrbances:none ? Narrative Interpretation:  ? Old EKG Reviewed: unchanged ? ?I have personally reviewed the EKG tracing and agree with the computerized printout as noted. ? ? ?Radiology ?CT Angio Chest PE W and/or Wo Contrast ? ?Result Date: 07/18/2021 ?CLINICAL DATA:  Concern for pulmonary embolism. Abdominal pain and sepsis. History of metastatic pancreatic cancer. EXAM: CT ANGIOGRAPHY CHEST CT ABDOMEN AND PELVIS WITH CONTRAST TECHNIQUE: Multidetector CT imaging of the chest was performed using the standard protocol during bolus administration of intravenous contrast. Multiplanar CT image reconstructions and MIPs were obtained  to evaluate the vascular anatomy. Multidetector CT imaging of the abdomen and pelvis was performed using the standard protocol during bolus administration of intravenous contrast. RADIATION DOSE REDUCTION: T

## 2021-07-18 NOTE — Progress Notes (Addendum)
Pharmacy Antibiotic Note ? ?[name] is a 76 y.o. female admitted on 07/18/2021 with concerns for sepsis.  Pharmacy has been consulted for Cefepime and Vancomycin dosing. ? ?Plan: ?Cefepime 2 g IV q12h ?Vancomycin 1500 mg IV x 1, followed by 1000 mg IV q24h (eAUC 468.4, Scr 0.8, goal AUC 400-550) ?Follow-up clinical status, renal function ?Follow-up cultures, LOT, narrow as able  ?Obtain Vancomycin levels as appropriate  ? ?Height: '5\' 3"'$  (160 cm) ?Weight: 67.1 kg (148 lb) ?IBW/kg (Calculated) : 52.4 ? ?Temp (24hrs), Avg:100.3 ?F (37.9 ?C), Min:100.3 ?F (37.9 ?C), Max:100.3 ?F (37.9 ?C) ? ?No results for input(s): WBC, CREATININE, LATICACIDVEN, VANCOTROUGH, VANCOPEAK, VANCORANDOM, GENTTROUGH, GENTPEAK, GENTRANDOM, TOBRATROUGH, TOBRAPEAK, TOBRARND, AMIKACINPEAK, AMIKACINTROU, AMIKACIN in the last 168 hours.  ?Estimated Creatinine Clearance: 53.3 mL/min (by C-G formula based on SCr of 0.67 mg/dL).   ? ?Allergies  ?Allergen Reactions  ? Penicillins Hives and Other (See Comments)  ?  Has patient had a PCN reaction causing immediate rash, facial/tongue/throat swelling, SOB or lightheadedness with hypotension: No ?Has patient had a PCN reaction causing severe rash involving mucus membranes or skin necrosis: No ?Has patient had a PCN reaction that required hospitalization No ?Has patient had a PCN reaction occurring within the last 10 years: No ?If all of the above answers are "NO", then may proceed with Cephalosporin use.  ? ? ?Antimicrobials this admission: ?Cefepime 5/3 >>  ?Vancomycin 5/3 >>  ?Aztreonam 5/3 ?Flagyl 5/3  ? ?Microbiology results: ?5/2 BCx: pending ?5/2 UCx: pending  ? ? ?Thank you for allowing pharmacy to be a part of this patient?s care. ? ?Vance Peper, PharmD ?PGY1 Pharmacy Resident ?07/18/2021 3:29 PM  ? ?Please check AMION for all [facility] phone numbers ?After 10:00 PM, call [facility] [phone] ? ? ?

## 2021-07-18 NOTE — Assessment & Plan Note (Signed)
? ? ? ?#)   Type 2 Diabetes Mellitus: documented history of such.  Managed via lifestyle modifications, with most recent hemoglobin A1c noted to be 6.3% on 07/10/2021.  Home insulin regimen: None. Home oral hypoglycemic agents: None. presenting blood sugar: 191.  ? ?Plan: In the setting of current n.p.o. status, accuchecks every 6 hours with very low dose SSI.  ? ? ?

## 2021-07-18 NOTE — Telephone Encounter (Signed)
Patient's daughter had called office and left message stating patient had chills, change in urine color, and lower blood pressure (100/60). Patient called by this RN and asked if she was currently running a fever.  Patient stated she had checked it a few hours prior and it was "normal".  Patient then went to get a thermometer to check her temperature but did not immediately return to phone.  Patient was heard in background mumbling.  Patient was kept on phone line while an attempt was made to call patient's daughter.  Patients daughter answered phone and stated she was not currently with patient, but that a family member was currently with the patient.  Request was made for daughter to reach out to family member to make sure patient was ok.  This RN continued to stay on the phone line connected with patient.  A phone was heard ringing in the background and muffled voices were also present, but there was no confirmation that patient was ok.  Another call was made to the daughter and she stated she was unable to get in contact with the patient or the family member in the house.  Patient's daughter stated she was concerned that her mother's health has been declining since she had started treatment, and felt that with the symptoms her mother presently had, she was becoming septic. This RN informed the daughter that she was concerned for her mother's safety at the moment as she was not able to re-establish contact with the patient.  Daughter stated she would go to patient's home to check to see if patient was ok and would contact office when she got to the patient's residence. Another attempt to contact patient was made.  Patient answered the phone, stated "I don't want to talk to you" and the call was then disconnected.  Both Ned Card, NP and Dr. Benay Spice made aware.  Both agreed patient should go straight to the ED to be evaluated.  Called daughter to inform her of Dr. Gearldine Shown response.  Daughter stated she was  at patient's residence and was currently on the phone with EMS to have patient taken to the hospital. Patient was disoriented and was running a fever of 100.4.  ?Dr. Benay Spice and Ned Card, NP updated.  No new orders at this time.  ?

## 2021-07-18 NOTE — Assessment & Plan Note (Signed)
? ? ? ?#)   acquired hypothyroidism: documented h/o such, on Synthroid as outpatient.  ? ?Plan: In the setting of current n.p.o. status, will hold home Synthroid for now. ? ? ?

## 2021-07-18 NOTE — Consult Note (Signed)
? ?CC/Reason for consult: ?Cholecystitis, gallbladder perforation? Setting of advanced pancreatic cancer on chemotherapy ? ?Consulting request by: Domenic Moras PA-C ? ?HPI: ?[name] is an 76 y.o. female hx HTN, HLD, DM, advanced pancreatic cancer (T4N0M1) - remote hx of biliary stent placement (which is indwelling) currently undergoing chemotherapy with gem/abraxane with Dr. Benay Spice in medical oncology - presented to ED today with AMS.  Her daughter had reported recent admission with COVID-pneumonia but having improved/resolved.  For at least a week she had noted an increased amount of fatigue, fevers, cough, decreased appetite, and dark urine.  She did not report any abdominal pain but since being in the emergency room has noted some abdominal discomfort.  This is more generalized.  Occasional nausea and vomiting.  No diarrhea or noted constipation. ? ?Past Medical History:  ?Diagnosis Date  ? Anxiety   ? Arthritis   ? Cancer [facility])   ? Depression   ? Diabetes mellitus without complication ([place])   ? Hyperlipemia   ? Hypertension   ? Thyroid disease   ? TIA (transient ischemic attack)   ? Vitamin B12 deficiency 10/20/2019  ? ? ?Past Surgical History:  ?Procedure Laterality Date  ? ABDOMINAL HYSTERECTOMY    ? BILIARY STENT PLACEMENT N/A 09/28/2020  ? Procedure: BILIARY STENT PLACEMENT;  Surgeon: Clarene Essex, MD;  Location: [facility] ENDOSCOPY;  Service: Endoscopy;  Laterality: N/A;  ? BREAST BIOPSY    ? ERCP N/A 09/28/2020  ? Procedure: ENDOSCOPIC RETROGRADE CHOLANGIOPANCREATOGRAPHY (ERCP);  Surgeon: Clarene Essex, MD;  Location: [facility];  Service: Endoscopy;  Laterality: N/A;  ? IR IMAGING GUIDED PORT INSERTION  09/22/2020  ? SPHINCTEROTOMY  09/28/2020  ? Procedure: SPHINCTEROTOMY;  Surgeon: Clarene Essex, MD;  Location: [facility];  Service: Endoscopy;;  ? TUBAL LIGATION    ? ? ?Family History  ?Problem Relation Age of Onset  ? Diabetes Mother   ? Alzheimer's disease Mother   ? Dementia Mother   ? COPD Father   ?  Emphysema Father   ? Heart disease Sister   ? Alcohol abuse Brother   ? Heart disease Brother   ? Emphysema Brother   ? Heart attack Daughter   ? Cancer Other   ?     breast cancer  ? ? ?Social:  reports that she quit smoking about 34 years ago. Her smoking use included cigarettes. She has a 10.00 pack-year smoking history. She has never used smokeless tobacco. She reports current alcohol use of about 21.0 standard drinks per week. She reports that she does not use drugs. ? ?Allergies:  ?Allergies  ?Allergen Reactions  ? Penicillins Hives and Other (See Comments)  ?  Has patient had a PCN reaction causing immediate rash, facial/tongue/throat swelling, SOB or lightheadedness with hypotension: No ?Has patient had a PCN reaction causing severe rash involving mucus membranes or skin necrosis: No ?Has patient had a PCN reaction that required hospitalization No ?Has patient had a PCN reaction occurring within the last 10 years: No ?If all of the above answers are "NO", then may proceed with Cephalosporin use.  ? ? ?Medications: I have reviewed the patient's current medications. ? ?Results for orders placed or performed during the hospital encounter of 07/18/21 (from the past 48 hour(s))  ?Urinalysis, Routine w reflex microscopic     Status: Abnormal  ? Collection Time: 07/18/21  2:29 PM  ?Result Value Ref Range  ? Color, Urine YELLOW YELLOW  ? APPearance HAZY (A) CLEAR  ? Specific Gravity, Urine 1.019 1.005 -  1.030  ? pH 5.0 5.0 - 8.0  ? Glucose, UA NEGATIVE NEGATIVE mg/dL  ? Hgb urine dipstick NEGATIVE NEGATIVE  ? Bilirubin Urine NEGATIVE NEGATIVE  ? Ketones, ur 20 (A) NEGATIVE mg/dL  ? Protein, ur NEGATIVE NEGATIVE mg/dL  ? Nitrite NEGATIVE NEGATIVE  ? Leukocytes,Ua TRACE (A) NEGATIVE  ? RBC / HPF 0-5 0 - 5 RBC/hpf  ? WBC, UA 0-5 0 - 5 WBC/hpf  ? Bacteria, UA RARE (A) NONE SEEN  ? Squamous Epithelial / LPF 0-5 0 - 5  ? Mucus PRESENT   ?  Comment: Performed at [facility], [place] [address] [place], [place]  [address]  ?Lactic acid, plasma     Status: Abnormal  ? Collection Time: 07/18/21  2:47 PM  ?Result Value Ref Range  ? Lactic Acid, Venous 2.4 (HH) 0.5 - 1.9 mmol/L  ?  Comment: CRITICAL RESULT CALLED TO, READ BACK BY AND VERIFIED WITH: Benjamin Stain RN [id number] 07/18/2021 BY R VERAAR ?Performed at [facility], [place] [address], [place], [place] [address] ?  ?Resp Panel by RT-PCR (Flu A&B, Covid) Nasopharyngeal Swab     Status: Abnormal  ? Collection Time: 07/18/21  2:47 PM  ? Specimen: Nasopharyngeal Swab; Nasopharyngeal(NP) swabs in vial transport medium  ?Result Value Ref Range  ? SARS Coronavirus 2 by RT PCR POSITIVE (A) NEGATIVE  ?  Comment: (NOTE) ?SARS-CoV-2 target nucleic acids are DETECTED. ? ?The SARS-CoV-2 RNA is generally detectable in upper respiratory ?specimens during the acute phase of infection. Positive results are ?indicative of the presence of the identified virus, but do not rule ?out bacterial infection or co-infection with other pathogens not ?detected by the test. Clinical correlation with patient history and ?other diagnostic information is necessary to determine patient ?infection status. The expected result is Negative. ? ?Fact Sheet for Patients: ?EntrepreneurPulse.com.au ? ?Fact Sheet for Healthcare Providers: ?IncredibleEmployment.be ? ?This test is not yet approved or cleared by the [place] FDA and  ?has been authorized for detection and/or diagnosis of SARS-CoV-2 by ?FDA under an Emergency Use Authorization (EUA).  This EUA will ?remain in effect (meaning this test can be used) for the duration of  ?the COVID-19 declaration under Section 564(b)(1) of the A ct, 21 ?U.S.C. section 360bbb-3(b)(1), unless the authorization is ?terminated or revoked sooner. ? ?  ? Influenza A by PCR NEGATIVE NEGATIVE  ? Influenza B by PCR NEGATIVE NEGATIVE  ?  Comment: (NOTE) ?The Xpert Xpress SARS-CoV-2/FLU/RSV plus assay is intended as an aid ?in the diagnosis of influenza  from Nasopharyngeal swab specimens and ?should not be used as a sole basis for treatment. Nasal washings and ?aspirates are unacceptable for Xpert Xpress SARS-CoV-2/FLU/RSV ?testing. ? ?Fact Sheet for Patients: ?EntrepreneurPulse.com.au ? ?Fact Sheet for Healthcare Providers: ?IncredibleEmployment.be ? ?This test is not yet approved or cleared by the [place] FDA and ?has been authorized for detection and/or diagnosis of SARS-CoV-2 by ?FDA under an Emergency Use Authorization (EUA). This EUA will remain ?in effect (meaning this test can be used) for the duration of the ?COVID-19 declaration under Section 564(b)(1) of the Act, 21 U.S.C. ?section 360bbb-3(b)(1), unless the authorization is terminated or ?revoked. ? ?Performed at [facility], [place] [address] [place], [place] ?[address] ?  ?CBG monitoring, ED     Status: Abnormal  ? Collection Time: 07/18/21  2:55 PM  ?Result Value Ref Range  ? Glucose-Capillary 186 (H) 70 - 99 mg/dL  ?  Comment: Glucose reference range applies  only to samples taken after fasting for at least 8 hours.  ?Comprehensive metabolic panel     Status: Abnormal  ? Collection Time: 07/18/21  3:03 PM  ?Result Value Ref Range  ? Sodium 129 (L) 135 - 145 mmol/L  ? Potassium 3.6 3.5 - 5.1 mmol/L  ? Chloride 102 98 - 111 mmol/L  ? CO2 16 (L) 22 - 32 mmol/L  ? Glucose, Bld 191 (H) 70 - 99 mg/dL  ?  Comment: Glucose reference range applies only to samples taken after fasting for at least 8 hours.  ? BUN 11 8 - 23 mg/dL  ? Creatinine, Ser 0.77 0.44 - 1.00 mg/dL  ? Calcium 8.4 (L) 8.9 - 10.3 mg/dL  ? Total Protein 6.2 (L) 6.5 - 8.1 g/dL  ? Albumin 2.7 (L) 3.5 - 5.0 g/dL  ? AST 23 15 - 41 U/L  ? ALT 22 0 - 44 U/L  ? Alkaline Phosphatase 138 (H) 38 - 126 U/L  ? Total Bilirubin 1.2 0.3 - 1.2 mg/dL  ? GFR, Estimated >60 >60 mL/min  ?  Comment: (NOTE) ?Calculated using the CKD-EPI Creatinine Equation (2021) ?  ? Anion gap 11 5 - 15  ?  Comment: Performed at [facility], [place] [address] [place], [place] [address]  ?Lactic acid, plasma     Status: None  ? Collection Time: 07/18/21  3:03 PM  ?Result Value Ref Range  ? Lactic Acid, Venous 1.6 0.5 - 1.9 mmol/L

## 2021-07-18 NOTE — H&P (Signed)
?History and Physical  ? ? ?PLEASE NOTE THAT [employer] SOFTWARE WAS USED IN THE CONSTRUCTION OF THIS NOTE. ? ? ?LARENDA [name] LPF:[id number] DOB: [birth year] DOA: 07/18/2021 ? ?PCP: [name], MD  ?Patient coming from: home  ? ?I have personally briefly reviewed patient's old medical records in [place] ? ?Chief Complaint: altered mental status ? ?HPI: [name] is a 76 y.o. female with medical history significant for pancreatic cancer on chemotherapy, GERD, type 2 diabetes mellitus, acquired hypothyroidism, GERD, who is admitted to [facility] on 07/18/2021 with acute cholecystitis after presenting from home to [facility] ED for evaluation of altered mental status.  ? ?The following history is provided by the patient as well as her daughter, in addition to my discussions with the EDP and via chart review. ? ?Over the course of the last week, patient has reportedly demonstrated evidence of increased somnolence as well as lethargy and some confusion relative to her baseline level of mental status and activity.  Over that timeframe, the patient's also reported progressive generalized abdominal discomfort as well as worsening of intermittent nausea, but in the absence of any associated vomiting.  However, this has resulted in an interval decline in oral intake of both food and water over the course of the last week.  No recent diarrhea, rash, dysuria, gross hematuria, or change in urinary urgency/frequency.  No preceding trauma. ? ?denies any subjective fever, chills, rigors, or generalized myalgias.  Of note, the patient was recently hospitalized at [facility] for COVID-19 infection, with positive COVID-19 PCR result noted on 06/20/2021 , with residual positive finding on 06/30/2021.  She reports ensuing improvement of the symptoms prompted prior evaluation/hospitalization for COVID. no residual sob, wheezing, cough. Denies any recent chest pain, diaphoresis, or palpitations. ? ?Not associated  w/ any acute focal weakness, acute focal numbness, paresthesias, facial droop, slurred speech, expressive aphasia, acute change in vision, dysphagia, vertigo. ? ?She has a history of pancreatic cancer, for which she follows with Dr. Benay Spice as her outpatient oncologist, actively undergoing chemotherapy, with next chemotherapy session scheduled for 07/24/2021. ? ?Per chart review, she underwent ERCP with biliary stent placement in sphincterectomy via Dr.Magod on 09/28/20.  ? ? ? ? ?ED Course:  ?Vital signs in the ED were notable for the following: Temperature max100.3; heart rate initially 122, subsequent decreased to 90 following interval administration of IV fluids, as further quantified below; blood pressure 105/79 initially, subsequently increasing to 131/77 following interval IV fluids; respiratory rate 18-24, oxygen saturation 96 to 98% on room air. ? ?Labs were notable for the following: CMP notable for the following: Sodium, corrected for mild hyperglycemia noted to be approximately 130.5 compared to most recent prior corrected serum sodium level of 134.5 on 07/09/2021, bicarbonate 16, anion gap 11, BUN 11, creatinine 0.77 compared to most recent prior serum creatinine data point of 0.67 on 07/09/2021, glucose 191, calcium corrected for mild hypoalbuminemia at 9.4, albumin 2.7, alkaline phosphatase 138 compared to 171 on 07/09/2021, AST 23, ALT 22, total bilirubin 1.2.  CBC notable for Hoppens count 7700.  Initial lactate 2.4, which subsequent decreased to 1.6 following interval administration of IV fluids, as below.  INR 1.2.  Urinalysis notable for no white blood cells, rare bacteria, nitrate negative, no squamous epithelial cells, 20 ketones.  Blood cultures x2 collected prior to initiation of IV antibiotics influenza A/B PCR negative. ? ?Imaging and additional notable ED work-up: EKG showed sinus tachycardia with multiple PVCs, heart rate 120, normal  intervals, and no evidence of T wave or ST changes,  including no evidence of ST elevation.  Chest x-ray showed mild left basilar atelectasis with probable small left pleural effusion in the absence of any evidence of infiltrate, edema, or pneumothorax.  CT abdomen/pelvis with contrast showed diffuse gallbladder wall thickening and edema with an area of discontinuity in the posterior aspect of the distal gallbladder wall, with possibility of gallbladder rupture or perforation not excluded.  Otherwise, no evidence of acute intra-abdominal or acute intrapelvic process, including normal-appearing appendix, sigmoid diverticulosis in the absence of diverticulitis, and no evidence of bowel obstruction, abscess, or perforation.  Additionally, CT abdomen/pelvis showed known pancreatic mass encasing portal vein and portal splenic confluence with evidence of collateralization. ? ?Additionally, in the setting of initial tachycardia in the context of underlying malignancy, CTA chest was pursued, showed no evidence of acute pulmonary embolism nor any evidence of infiltrate, edema, pneumothorax.  ? ?Case/imaging were discussed with the on-call general surgeon, Dr.Chris Dema Severin, Who will formally consult, and after evaluating patient, recommends admission to the hospitalist service.  Regarding the patient's acute gallbladder pathology, Dr.White conveys the patient is a nonsurgical candidate, but rather recommends pursuit of medical management alone, including broad-spectrum IV antibiotics.  Additional recommendations to follow via his formal consultation. ? ?While in the ED, the following were administered: Morphine 4 mg IV x2, fentanyl 25 mcg IV x1, Zofran, aztreonam, Flagyl, vancomycin.  Subsequently, pharmacy was also consulted for use of cefepime in the setting of the patient's acute intra-abdominal process, with next dose scheduled to occur tomorrow, as the patient already received dose of IV aztreonam.  Lactated Ringer's x2250 cc bolus followed by initiation continuous LR at 150  cc/h. ? ?Subsequently, the patient was admitted to med telemetry for further evaluation management of acute cholecystitis, including medical management thereof as per recommendations of general surgery consultation, with presentation also notable for suspected acute metabolic encephalopathy, and subacute hyponatremia. ? ? ? ? ?Review of Systems: As per HPI otherwise 10 point review of systems negative.  ? ?Past Medical History:  ?Diagnosis Date  ? Anxiety   ? Arthritis   ? Cancer [facility])   ? pancreatic  ? Depression   ? Diabetes mellitus without complication ([place])   ? Hyperlipemia   ? Hypertension   ? Thyroid disease   ? TIA (transient ischemic attack)   ? Vitamin B12 deficiency 10/20/2019  ? ? ?Past Surgical History:  ?Procedure Laterality Date  ? ABDOMINAL HYSTERECTOMY    ? BILIARY STENT PLACEMENT N/A 09/28/2020  ? Procedure: BILIARY STENT PLACEMENT;  Surgeon: Clarene Essex, MD;  Location: [facility] ENDOSCOPY;  Service: Endoscopy;  Laterality: N/A;  ? BREAST BIOPSY    ? ERCP N/A 09/28/2020  ? Procedure: ENDOSCOPIC RETROGRADE CHOLANGIOPANCREATOGRAPHY (ERCP);  Surgeon: Clarene Essex, MD;  Location: [facility];  Service: Endoscopy;  Laterality: N/A;  ? IR IMAGING GUIDED PORT INSERTION  09/22/2020  ? SPHINCTEROTOMY  09/28/2020  ? Procedure: SPHINCTEROTOMY;  Surgeon: Clarene Essex, MD;  Location: [facility];  Service: Endoscopy;;  ? TUBAL LIGATION    ? ? ?Social History: ? reports that she quit smoking about 34 years ago. Her smoking use included cigarettes. She has a 10.00 pack-year smoking history. She has never used smokeless tobacco. She reports current alcohol use of about 21.0 standard drinks per week. She reports that she does not use drugs. ? ? ?Allergies  ?Allergen Reactions  ? Penicillins Hives and Other (See Comments)  ?  Has patient had a PCN reaction  causing immediate rash, facial/tongue/throat swelling, SOB or lightheadedness with hypotension: No ?Has patient had a PCN reaction causing severe rash involving mucus  membranes or skin necrosis: No ?Has patient had a PCN reaction that required hospitalization No ?Has patient had a PCN reaction occurring within the last 10 years: No ?If all of the above answers are "NO", then may

## 2021-07-18 NOTE — Assessment & Plan Note (Signed)
? ?#(  Acute cholecystitis: Diagnosis on the basis of 1 week of progressive abdominal pain, most prominent in the right upper quadrant, associated with increased nausea, decreased oral intake, with today CT abdomen/pelvis showing diffuse gallbladder wall thickening with associated gallbladder edema.  Additionally, formal radiology read of the aforementioned and CT abdomen/pelvis also noted that associated gallbladder rupture/perforation could not be excluded.  Of note, no evidence of cholestasis per review of presenting liver enzymes, no evidence of choledocholithiasis on imaging. ? ?Case/imaging were discussed with the on-call general surgeon, Dr.Chris Dema Severin, Who will formally consult, and after evaluating patient, recommends admission to the hospitalist service.  Regarding the patient's acute gallbladder pathology, Dr.White conveys the patient is a nonsurgical candidate, even in the setting of acute gallbladder rupture/perforation, but rather recommends pursuit of medical management alone, including broad-spectrum IV antibiotics.  Additional recommendations to follow via his formal consultation. ? ?Clinically, presentation does not appear to be associated with ascending cholangitis this time, particular in the absence of any objective fever and no evidence of jaundice.  Additionally, she appears hemodynamically stable, without any evidence of hypotension.  Additionally, in the absence of any leukocytosis or objective fever, criteria not met at this time for sepsis.  Blood cultures x2 collected in the ED this evening followed by initiation of aztreonam, Flagyl, IV vancomycin, with plan for ensuing initiation of cefepime.  She also received a greater than 30 mL/kg IVF bolus, as further quantified above.  No other source of infection identified at this time. ? ?In terms of IV antibiotics moving forward, will discontinue additional IV vancomycin, but continue cefepime.  Additionally, for inclusion of anaerobic  coverage, will resume IV Flagyl. ? ? ?  ?Plan: Monitor for results of blood cultures x2 collected prior to initiation of antibiotics.  Continue cefepime and Flagyl, as above.  continuous IV fluids.  NPO. Repeat CMP.   Repeat INR in the a.m.  General surgery formally consulted, as above, with additional recommendations to follow. repeat CBC with differential in the morning. Closing monitoring for development of hypotension as well as close monitoring for development of fever. Prn IV Fentanyl. Prn IV Zofran.  Prn IV Ativan for nausea refractory to as needed Zofran .  Check serum Mg level. ? ?

## 2021-07-18 NOTE — ED Notes (Signed)
Lactic 2.4- Dr. Alvino Chapel and Primary RN notified. ?

## 2021-07-18 NOTE — Assessment & Plan Note (Signed)
? ?#)   GAD: Documented history of such, prn Xanax as an outpatient.  In setting of her presenting suspected acute metabolic encephalopathy, will refrain from as needed Xanax for now. ? ?Plan: Hold home as needed Xanax for now, as above. ? ? ?

## 2021-07-18 NOTE — ED Notes (Signed)
Received verbal report from Megan R RN at t his time °

## 2021-07-18 NOTE — Assessment & Plan Note (Signed)
? ?#)   Sub-acute hypo-osmolar hyponatremia: Presenting corrected serum sodium 130.5 relative to most recent prior corrected serum sodium of 134.5 on 07/09/2021. Suspect an element of hypovolumeia, with suspected contribution from dehydration in the setting of clinical evidence of such as well as report of recent decline in oral intake coinciding with the timeframe of decline of serum sodium levels.  Differential also includes the possibility of a contribution from SIADH. in general, will continue to provide gentle IV fluids to attend to suspected contribution from dehydration, while further evaluating for any additional contributing factors, including SIADH, as further detailed below. Will also check TSH Reticulate the context of a documented history of acquired hypothyroidism. No overt pharmacologic contributions. Of note, no evidence of associated acute focal neurologic deficits and no report of recent trauma.  ? ? ? ?Plan: monitor strict I's and O's and daily weights.  check random urine sodium, urine osmolality.  Check serum osmolality to confirm suspected hypoosmolar etiology.  Repeat BMP in the morning. Check TSH.  Continuous IVF's, as above.  ? ? ?

## 2021-07-18 NOTE — ED Notes (Signed)
Pain meds administered. Mouth moistened with swabs at this time ?

## 2021-07-18 NOTE — ED Triage Notes (Signed)
Patient BIB EMS after reporting being altered since this morning.  Temp originally 103 was given 1gram tylenol at home pta.  Patient is on chemo.  COmplains of back pain.   ?

## 2021-07-18 NOTE — Progress Notes (Signed)
Update: I called the patient's daughter Langston Reusing) at [phone]. I was able to provide [name] with updates on the status/results relating to her mother's care and answered all of her questions at this time.  ? ? ? ?Babs Bertin, DO ?Hospitalist ? ?

## 2021-07-18 NOTE — Assessment & Plan Note (Signed)
? ?#)   Acute metabolic encephalopathy: 1 week of somnolence, lethargy, mild confusion relative to patient's baseline mental status and level of activity.  Patient's acute encephalopathy appears to be on the basis of physiologic stressors stemming from presenting suspected acute cholecystitis, as above. ? ?No obvious additional contributory underlying infectious process at this time, including negative influenza PCR performed today and presenting UA that is not suggestive of UTI.  Additionally, presenting chest x-ray as well as CTA chest showed no evidence of acute intrapulmonary process, including no evidence of pneumonia.  ? ?There is potential secondary contribution from parent subacute hyponatremia, as further detailed below, although this is not felt to be the primary contributor to her suspected acute metabolic encephalopathy. no additional overt metabolic/electrolyte contributions at this time.   ? ?No obvious contributory pharmacologic factors, although patient is on multiple medications at home, they can have central acting capabilities, including prn Xanax.  No overt acute focal neurologic deficits to suggest a contribution from an underlying acute CVA. Seizures are also felt to be less likely.  ? ? ? ?Plan: fall precautions. Repeat CMP/CBC in the AM. Check magnesium level. check VBG, TSH, MMA.  hold outpatient prn Xanax, PPI.  Further evaluation management of presenting suspected acute cholecystitis, including medical management via broad-spectrum IV antibiotics, as further detailed above. ? ?

## 2021-07-19 ENCOUNTER — Telehealth: Payer: Self-pay

## 2021-07-19 ENCOUNTER — Inpatient Hospital Stay (HOSPITAL_COMMUNITY): Payer: Medicare HMO

## 2021-07-19 ENCOUNTER — Other Ambulatory Visit (HOSPITAL_COMMUNITY): Payer: Medicare HMO

## 2021-07-19 DIAGNOSIS — K819 Cholecystitis, unspecified: Principal | ICD-10-CM | POA: Diagnosis present

## 2021-07-19 DIAGNOSIS — E039 Hypothyroidism, unspecified: Secondary | ICD-10-CM | POA: Diagnosis not present

## 2021-07-19 DIAGNOSIS — G9341 Metabolic encephalopathy: Secondary | ICD-10-CM | POA: Diagnosis not present

## 2021-07-19 DIAGNOSIS — K810 Acute cholecystitis: Secondary | ICD-10-CM | POA: Diagnosis not present

## 2021-07-19 DIAGNOSIS — E119 Type 2 diabetes mellitus without complications: Secondary | ICD-10-CM | POA: Diagnosis not present

## 2021-07-19 LAB — COMPREHENSIVE METABOLIC PANEL
ALT: 18 U/L (ref 0–44)
AST: 24 U/L (ref 15–41)
Albumin: 2.2 g/dL — ABNORMAL LOW (ref 3.5–5.0)
Alkaline Phosphatase: 94 U/L (ref 38–126)
Anion gap: 9 (ref 5–15)
BUN: 9 mg/dL (ref 8–23)
CO2: 18 mmol/L — ABNORMAL LOW (ref 22–32)
Calcium: 8.3 mg/dL — ABNORMAL LOW (ref 8.9–10.3)
Chloride: 103 mmol/L (ref 98–111)
Creatinine, Ser: 0.58 mg/dL (ref 0.44–1.00)
GFR, Estimated: >60 mL/min (ref >60)
Glucose, Bld: 178 mg/dL — ABNORMAL HIGH (ref 70–99)
Potassium: 3.8 mmol/L (ref 3.5–5.1)
Sodium: 130 mmol/L — ABNORMAL LOW (ref 135–145)
Total Bilirubin: 0.9 mg/dL (ref 0.3–1.2)
Total Protein: 5.2 g/dL — ABNORMAL LOW (ref 6.5–8.1)

## 2021-07-19 LAB — I-STAT VENOUS BLOOD GAS, ED
Acid-base deficit: 5 mmol/L — ABNORMAL HIGH (ref 0.0–2.0)
Bicarbonate: 18.6 mmol/L — ABNORMAL LOW (ref 20.0–28.0)
Calcium, Ion: 1.18 mmol/L (ref 1.15–1.40)
HCT: 33 % — ABNORMAL LOW (ref 36.0–46.0)
Hemoglobin: 11.2 g/dL — ABNORMAL LOW (ref 12.0–15.0)
O2 Saturation: 92 %
Potassium: 3.8 mmol/L (ref 3.5–5.1)
Sodium: 131 mmol/L — ABNORMAL LOW (ref 135–145)
TCO2: 19 mmol/L — ABNORMAL LOW (ref 22–32)
pCO2, Ven: 30.1 mmHg — ABNORMAL LOW (ref 44–60)
pH, Ven: 7.397 (ref 7.25–7.43)
pO2, Ven: 64 mmHg — ABNORMAL HIGH (ref 32–45)

## 2021-07-19 LAB — CBC WITH DIFFERENTIAL/PLATELET
Abs Immature Granulocytes: 0 10*3/uL (ref 0.00–0.07)
Band Neutrophils: 12 %
Basophils Absolute: 0 10*3/uL (ref 0.0–0.1)
Basophils Relative: 0 %
Eosinophils Absolute: 0 10*3/uL (ref 0.0–0.5)
Eosinophils Relative: 0 %
HCT: 34.4 % — ABNORMAL LOW (ref 36.0–46.0)
Hemoglobin: 11.5 g/dL — ABNORMAL LOW (ref 12.0–15.0)
Lymphocytes Relative: 12 %
Lymphs Abs: 0.5 10*3/uL — ABNORMAL LOW (ref 0.7–4.0)
MCH: 30.7 pg (ref 26.0–34.0)
MCHC: 33.4 g/dL (ref 30.0–36.0)
MCV: 92 fL (ref 80.0–100.0)
Metamyelocytes Relative: 1 %
Monocytes Absolute: 0.3 10*3/uL (ref 0.1–1.0)
Monocytes Relative: 8 %
Neutro Abs: 3 10*3/uL (ref 1.7–7.7)
Neutrophils Relative %: 67 %
Platelets: 121 10*3/uL — ABNORMAL LOW (ref 150–400)
RBC: 3.74 MIL/uL — ABNORMAL LOW (ref 3.87–5.11)
RDW: 14.7 % (ref 11.5–15.5)
WBC: 3.8 10*3/uL — ABNORMAL LOW (ref 4.0–10.5)
nRBC: 0 % (ref 0.0–0.2)
nRBC: 0 /100 WBC

## 2021-07-19 LAB — BLOOD CULTURE ID PANEL (REFLEXED) - BCID2

## 2021-07-19 LAB — GLUCOSE, CAPILLARY
Glucose-Capillary: 117 mg/dL — ABNORMAL HIGH (ref 70–99)
Glucose-Capillary: 123 mg/dL — ABNORMAL HIGH (ref 70–99)

## 2021-07-19 LAB — CREATININE, URINE, RANDOM: Creatinine, Urine: 133.49 mg/dL

## 2021-07-19 LAB — PROTIME-INR
INR: 1.3 — ABNORMAL HIGH (ref 0.8–1.2)
Prothrombin Time: 16.3 seconds — ABNORMAL HIGH (ref 11.4–15.2)

## 2021-07-19 LAB — CBG MONITORING, ED
Glucose-Capillary: 178 mg/dL — ABNORMAL HIGH (ref 70–99)
Glucose-Capillary: 145 mg/dL — ABNORMAL HIGH (ref 70–99)
Glucose-Capillary: 139 mg/dL — ABNORMAL HIGH (ref 70–99)

## 2021-07-19 LAB — CK: Total CK: 29 U/L — ABNORMAL LOW (ref 38–234)

## 2021-07-19 LAB — SODIUM, URINE, RANDOM: Sodium, Ur: 101 mmol/L

## 2021-07-19 LAB — TSH: TSH: 2.251 u[IU]/mL (ref 0.350–4.500)

## 2021-07-19 LAB — MAGNESIUM: Magnesium: 1.5 mg/dL — ABNORMAL LOW (ref 1.7–2.4)

## 2021-07-19 LAB — OSMOLALITY: Osmolality: 271 mOsm/kg — ABNORMAL LOW (ref 275–295)

## 2021-07-19 LAB — OSMOLALITY, URINE: Osmolality, Ur: 735 mOsm/kg (ref 300–900)

## 2021-07-19 MED ORDER — HYDROMORPHONE HCL 1 MG/ML IJ SOLN
0.5000 mg | INTRAMUSCULAR | Status: DC | PRN
  Administered 2021-07-19 – 2021-07-22 (×11): 0.5 mg via INTRAVENOUS
  Filled 2021-07-19: qty 1
  Filled 2021-07-19 (×10): qty 0.5

## 2021-07-19 MED ORDER — SODIUM CHLORIDE 0.9 % IV SOLN
INTRAVENOUS | Status: DC
Start: 2021-07-19 — End: 2021-07-23

## 2021-07-19 MED ORDER — METRONIDAZOLE 500 MG/100ML IV SOLN
500.0000 mg | Freq: Two times a day (BID) | INTRAVENOUS | Status: DC
  Administered 2021-07-19 – 2021-07-23 (×8): 500 mg via INTRAVENOUS
  Filled 2021-07-19 (×8): qty 100

## 2021-07-19 MED ORDER — MORPHINE SULFATE (PF) 4 MG/ML IV SOLN
2.7000 mg | Freq: Once | INTRAVENOUS | Status: AC
  Administered 2021-07-19: 2.7 mg via INTRAVENOUS
  Filled 2021-07-19: qty 1

## 2021-07-19 MED ORDER — TECHNETIUM TC 99M MEBROFENIN IV KIT
5.1500 | PACK | Freq: Once | INTRAVENOUS | Status: AC | PRN
  Administered 2021-07-19: 5.15 via INTRAVENOUS

## 2021-07-19 MED ORDER — MAGNESIUM SULFATE 2 GM/50ML IV SOLN
2.0000 g | Freq: Once | INTRAVENOUS | Status: AC
  Administered 2021-07-19: 2 g via INTRAVENOUS
  Filled 2021-07-19: qty 50

## 2021-07-19 MED ORDER — SODIUM CHLORIDE 0.9 % IV SOLN
2.0000 g | INTRAVENOUS | Status: DC
  Administered 2021-07-19 – 2021-07-21 (×3): 2 g via INTRAVENOUS
  Filled 2021-07-19 (×3): qty 20

## 2021-07-19 NOTE — Progress Notes (Signed)
Vitals on arrival to [facility] room 6 ? ? ? 07/19/21 1807  ?Vitals  ?Temp 98.7 ?F (37.1 ?C)  ?Temp Source Oral  ?BP 111/64  ?BP Location Left Arm  ?BP Method Automatic  ?Patient Position (if appropriate) Lying  ?Pulse Rate (!) 110  ?Pulse Rate Source Dinamap  ?Resp 18  ?Level of Consciousness  ?Level of Consciousness Alert  ?MEWS COLOR  ?MEWS Score Color Green  ?Oxygen Therapy  ?SpO2 96 %  ?O2 Device Room Air  ?Pain Assessment  ?Pain Scale 0-10  ?Pain Score 2  ?PCA/Epidural/Spinal Assessment  ?Respiratory Pattern Regular;Unlabored  ?Glasgow Coma Scale  ?Eye Opening 4  ?Best Verbal Response (NON-intubated) 5  ?Best Motor Response 6  ?Glasgow Coma Scale Score 15  ?MEWS Score  ?MEWS Temp 0  ?MEWS Systolic 0  ?MEWS Pulse 1  ?MEWS RR 0  ?MEWS LOC 0  ?MEWS Score 1  ? ? ?

## 2021-07-19 NOTE — Progress Notes (Signed)
? ? ? [facility] ?                                                                            ? ? ?[name], is a 76 y.o. female, DOB - 04/08/1945, BSJ:[id number] ?Admit date - 07/18/2021    ?Outpatient Primary MD for the patient is [name], MD ? ?LOS - 1  days ? ? ? ?Brief summary  ? ?[name] is a 76 y.o. female with medical history significant for pancreatic cancer on chemotherapy, GERD, type 2 diabetes mellitus, acquired hypothyroidism, GERD, who is admitted to [facility] on 07/18/2021 with acute cholecystitis after presenting from home to [facility] ED for evaluation of altered mental status.  ? ?Assessment & Plan  ? ? ?Assessment and Plan: ? ?Acute cholecystitis: ?Pt presented with abdominal pain, nausea, decreased oral intake, and CT abd and pelvis showing GB wall thickening with associated GB edema, possible GB perforation on CT. Marland Kitchen  ?General surgery consulted, unfortunately she is not a surgical candidate. HIDA scan ordered by general surgery.   ?She was empirically started on IV antibiotics.  ?She is currently NPO.  ?Blood cultures show Enterobacterales and E coli.  ?Continue with IV fluids, and anti emetics.  ? ?Diet-controlled diabetes mellitus ([place]) ?Most recent hemoglobin A1c noted to be 6.3% on 07/10/2021.   ?CBG (last 3)  ?Recent Labs  ?  07/19/21 ?0510 07/19/21 ?7654 07/19/21 ?1148  ?GLUCAP 178* 145* 139*  ? ?Resume SSI. No changes in  meds.  ? ? ?Acquired hypothyroidism ?Will start synthroid in am once she is able to take food by mouth.  ? ? ? ?GAD (generalized anxiety disorder) ?Prn ativan ordered.  ? ? ?Hyponatremia ?Sub-acute hypo-osmolar hyponatremia: Suspect an element of hypovolumeia,.  Differential also includes the possibility of a contribution from SIADH. ?TSH wnl. Check serum osmo.  ? ? ?Acute metabolic encephalopathy ?1 week of somnolence, lethargy, mild confusion relative to patient's baseline mental status and level of activity. No focal deficits.   ?Possibly secondary to UTI, Cholecystitis.  vs from hyponatremia vs from dehydration.  ? ?Hypomagnesemia:  ?Replaced.  ? ? ?Pancytopenia:  ?Mild anemia, thrombocytopenia and wbc count of 3.8. probably from chemotherapy.  ? ?Urinary tract infection:  ?- urine cultures show 1,00,000 gram negative rods.  ?- on rocephin.  ?Adenocarcinoma of the Pancreas, stage 4, on chemotherapy  ?Follows up with Dr Benay Spice.  ?  ? ? ?RN Pressure Injury Documentation: ?Pressure Injury 06/30/21 Thigh Posterior;Proximal;Right Stage 1 -  Intact skin with non-blanchable redness of a localized area usually over a bony prominence. Intact darkened skin right gluteal fold (Active)  ?06/30/21 2000  ?Location: Thigh  ?Location Orientation: Posterior;Proximal;Right  ?Staging: Stage 1 -  Intact skin with non-blanchable redness of a localized area usually over a bony prominence.  ?Wound Description (Comments): Intact darkened skin right gluteal fold  ?Present on Admission: Yes  ?Dressing Type None 07/02/21 1000  ?Local wound care. ? ?Hypertension:  ?BP parameters are optimal.  ? ? ?COVID -19 infection on 06/20/2021: ?Completed course of antibiotics.  ? ?Estimated body mass index is 26.22 kg/m? as calculated from the following: ?  Height as of this encounter: '5\' 3"'$  (1.6  m). ?  Weight as of this encounter: 67.1 kg. ? ?Code Status: full code.  ?DVT Prophylaxis:  heparin injection 5,000 Units Start: 07/18/21 2330 ?SCDs Start: 07/18/21 1925 ? ? ?Level of Care: Level of care: Med-Surg ?Family Communication: family at bedside.  ? ?Disposition Plan:     Remains inpatient appropriate:IV antibiotics.  ? ?Procedures:  ?HIDA SCAN PENDING.  ? ?Consultants:   ? ? ?Antimicrobials:  ? ?Anti-infectives (From admission, onward)  ? ? Start     Dose/Rate Route Frequency Ordered Stop  ? 07/19/21 1700  vancomycin (VANCOREADY) IVPB 1250 mg/250 mL  Status:  Discontinued       ? 1,250 mg ?166.7 mL/hr over 90 Minutes Intravenous Every 24 hours 07/18/21 1543 07/18/21 1853  ?  07/19/21 1700  vancomycin (VANCOCIN) IVPB 1000 mg/200 mL premix  Status:  Discontinued       ? 1,000 mg ?200 mL/hr over 60 Minutes Intravenous Every 24 hours 07/18/21 1853 07/18/21 1946  ? 07/19/21 1000  cefTRIAXone (ROCEPHIN) 2 g in sodium chloride 0.9 % 100 mL IVPB       ? 2 g ?200 mL/hr over 30 Minutes Intravenous Every 24 hours 07/19/21 0616    ? 07/19/21 0200  metroNIDAZOLE (FLAGYL) IVPB 500 mg  Status:  Discontinued       ? 500 mg ?100 mL/hr over 60 Minutes Intravenous Every 12 hours 07/18/21 1945 07/19/21 0616  ? 07/19/21 0000  ceFEPIme (MAXIPIME) 2 g in sodium chloride 0.9 % 100 mL IVPB  Status:  Discontinued       ? 2 g ?200 mL/hr over 30 Minutes Intravenous Every 12 hours 07/18/21 1607 07/19/21 0616  ? 07/18/21 1700  vancomycin (VANCOREADY) IVPB 1500 mg/300 mL       ? 1,500 mg ?150 mL/hr over 120 Minutes Intravenous  Once 07/18/21 1543 07/18/21 1848  ? 07/18/21 1545  ceFEPIme (MAXIPIME) 2 g in sodium chloride 0.9 % 100 mL IVPB  Status:  Discontinued       ? 2 g ?200 mL/hr over 30 Minutes Intravenous Every 12 hours 07/18/21 1543 07/18/21 1607  ? 07/18/21 1530  aztreonam (AZACTAM) 2 g in sodium chloride 0.9 % 100 mL IVPB  Status:  Discontinued       ? 2 g ?200 mL/hr over 30 Minutes Intravenous  Once 07/18/21 1527 07/18/21 1534  ? 07/18/21 1530  metroNIDAZOLE (FLAGYL) IVPB 500 mg       ? 500 mg ?100 mL/hr over 60 Minutes Intravenous  Once 07/18/21 1527 07/18/21 1652  ? 07/18/21 1530  vancomycin (VANCOCIN) IVPB 1000 mg/200 mL premix  Status:  Discontinued       ? 1,000 mg ?200 mL/hr over 60 Minutes Intravenous  Once 07/18/21 1527 07/18/21 1540  ? ?  ? ? ? ?Medications ? ?Scheduled Meds: ? heparin injection (subcutaneous)  5,000 Units Subcutaneous Q8H  ? insulin aspart  0-6 Units Subcutaneous Q6H  ? ?Continuous Infusions: ? cefTRIAXone (ROCEPHIN)  IV Stopped (07/19/21 1113)  ? ?PRN Meds:.acetaminophen **OR** acetaminophen, HYDROmorphone (DILAUDID) injection, LORazepam, naLOXone (NARCAN)  injection, ondansetron  (ZOFRAN) IV ? ? ? ?Subjective:  ? ?[name] was seen and examined today. Right upper quadrant abdominal pain. Nauseated.  ?Objective:  ? ?Vitals:  ? 07/19/21 1045 07/19/21 1100 07/19/21 1130 07/19/21 1145  ?BP: 111/68 122/69 115/73 114/69  ?Pulse: 94 96 93 95  ?Resp: (!) 21 (!) '21 18 19  '$ ?Temp:      ?TempSrc:      ?SpO2: 98% 98% 96% 96%  ?  Weight:      ?Height:      ? ? ?Intake/Output Summary (Last 24 hours) at 07/19/2021 1210 ?Last data filed at 07/19/2021 1128 ?Gross per 24 hour  ?Intake 3535.71 ml  ?Output --  ?Net 3535.71 ml  ? ?Filed Weights  ? 07/18/21 1511  ?Weight: 67.1 kg  ? ? ? ?Exam ?General exam: Appears calm and comfortable  ?Respiratory system: Clear to auscultation. Respiratory effort normal. ?Cardiovascular system: S1 & S2 heard, RRR. No JVD, No pedal edema. ?Gastrointestinal system: Abdomen is soft, mild gen tenderness present. No signs of peritonitis.  ?Central nervous system: Alert and oriented. No focal neurological deficits. ?Extremities: Symmetric 5 x 5 power. ?Skin: No rashes, lesions or ulcers ?Psychiatry: Mood & affect appropriate.  ? ? ?Data Reviewed:  I have personally reviewed following labs and imaging studies ? ? ?CBC ?Lab Results  ?Component Value Date  ? WBC 3.8 (L) 07/19/2021  ? RBC 3.74 (L) 07/19/2021  ? HGB 11.2 (L) 07/19/2021  ? HCT 33.0 (L) 07/19/2021  ? MCV 92.0 07/19/2021  ? MCH 30.7 07/19/2021  ? PLT 121 (L) 07/19/2021  ? MCHC 33.4 07/19/2021  ? RDW 14.7 07/19/2021  ? LYMPHSABS 0.5 (L) 07/19/2021  ? MONOABS 0.3 07/19/2021  ? EOSABS 0.0 07/19/2021  ? BASOSABS 0.0 07/19/2021  ? ? ? ?Last metabolic panel ?Lab Results  ?Component Value Date  ? NA 131 (L) 07/19/2021  ? K 3.8 07/19/2021  ? CL 103 07/19/2021  ? CO2 18 (L) 07/19/2021  ? BUN 9 07/19/2021  ? CREATININE 0.58 07/19/2021  ? GLUCOSE 178 (H) 07/19/2021  ? GFRNONAA >60 07/19/2021  ? GFRAA 71 02/02/2019  ? CALCIUM 8.3 (L) 07/19/2021  ? PROT 5.2 (L) 07/19/2021  ? ALBUMIN 2.2 (L) 07/19/2021  ? LABGLOB 2.2 02/02/2019  ? AGRATIO  2.0 02/02/2019  ? BILITOT 0.9 07/19/2021  ? ALKPHOS 94 07/19/2021  ? AST 24 07/19/2021  ? ALT 18 07/19/2021  ? ANIONGAP 9 07/19/2021  ? ? ?CBG (last 3)  ?Recent Labs  ?  07/19/21 ?0510 07/19/21 ?6270 07/19/21

## 2021-07-19 NOTE — Progress Notes (Addendum)
PHARMACY - PHYSICIAN COMMUNICATION ?CRITICAL VALUE ALERT - BLOOD CULTURE IDENTIFICATION (BCID) ? ?[name] is an 76 y.o. female who presented to [facility] on 07/18/2021 with a chief complaint of AMS and cholecystitis ? ?Assessment:  2/2 blood cultures growing E. coli ? ?Name of physician (or Provider) Contacted:  ?Dr. Velia Meyer ? ?Current antibiotics: Cefepime and Flagyl ? ?Changes to prescribed antibiotics recommended:  ?Change to Rocephin 2 g IV q24h ? ? ?Results for orders placed or performed during the hospital encounter of 07/18/21  ?Blood Culture ID Panel (Reflexed) (Collected: 07/18/2021  3:07 PM)  ?Result Value Ref Range  ? Enterococcus faecalis NOT DETECTED NOT DETECTED  ? Enterococcus Faecium NOT DETECTED NOT DETECTED  ? Listeria monocytogenes NOT DETECTED NOT DETECTED  ? Staphylococcus species NOT DETECTED NOT DETECTED  ? Staphylococcus aureus (BCID) NOT DETECTED NOT DETECTED  ? Staphylococcus epidermidis NOT DETECTED NOT DETECTED  ? Staphylococcus lugdunensis NOT DETECTED NOT DETECTED  ? Streptococcus species NOT DETECTED NOT DETECTED  ? Streptococcus agalactiae NOT DETECTED NOT DETECTED  ? Streptococcus pneumoniae NOT DETECTED NOT DETECTED  ? Streptococcus pyogenes NOT DETECTED NOT DETECTED  ? A.calcoaceticus-baumannii NOT DETECTED NOT DETECTED  ? Bacteroides fragilis NOT DETECTED NOT DETECTED  ? Enterobacterales DETECTED (A) NOT DETECTED  ? Enterobacter cloacae complex NOT DETECTED NOT DETECTED  ? Escherichia coli DETECTED (A) NOT DETECTED  ? Klebsiella aerogenes NOT DETECTED NOT DETECTED  ? Klebsiella oxytoca NOT DETECTED NOT DETECTED  ? Klebsiella pneumoniae NOT DETECTED NOT DETECTED  ? Proteus species NOT DETECTED NOT DETECTED  ? Salmonella species NOT DETECTED NOT DETECTED  ? Serratia marcescens NOT DETECTED NOT DETECTED  ? Haemophilus influenzae NOT DETECTED NOT DETECTED  ? Neisseria meningitidis NOT DETECTED NOT DETECTED  ? Pseudomonas aeruginosa NOT DETECTED NOT DETECTED  ?  Stenotrophomonas maltophilia NOT DETECTED NOT DETECTED  ? Candida albicans NOT DETECTED NOT DETECTED  ? Candida auris NOT DETECTED NOT DETECTED  ? Candida glabrata NOT DETECTED NOT DETECTED  ? Candida krusei NOT DETECTED NOT DETECTED  ? Candida parapsilosis NOT DETECTED NOT DETECTED  ? Candida tropicalis NOT DETECTED NOT DETECTED  ? Cryptococcus neoformans/gattii NOT DETECTED NOT DETECTED  ? CTX-M ESBL NOT DETECTED NOT DETECTED  ? Carbapenem resistance IMP NOT DETECTED NOT DETECTED  ? Carbapenem resistance KPC NOT DETECTED NOT DETECTED  ? Carbapenem resistance NDM NOT DETECTED NOT DETECTED  ? Carbapenem resist OXA 48 LIKE NOT DETECTED NOT DETECTED  ? Carbapenem resistance VIM NOT DETECTED NOT DETECTED  ? ? ?[name], [name] ?07/19/2021  6:13 AM ? ?

## 2021-07-19 NOTE — Progress Notes (Signed)
Patient arrived to [place] room 6 alert and oriented x4 pain level 2/10 bed in lowest position call light in reach. Flagyl IVPB started fluids hung. Will continue to monitor pt. ?

## 2021-07-19 NOTE — Progress Notes (Signed)
[facility] ?Progress Note ? ?   ?Subjective: ?CC:  ?Still in ED. States she came in due to fever. States she did not have abdominal pain prior to arrival but in the ED developed RUQ/R flank pain. ? ?Objective: ?Vital signs in last 24 hours: ?Temp:  [97.5 ?F (36.4 ?C)-100.3 ?F (37.9 ?C)] 97.9 ?F (36.6 ?C) (05/04 0730) ?Pulse Rate:  [81-136] 95 (05/04 0915) ?Resp:  [14-28] 16 (05/04 0915) ?BP: (101-140)/(62-84) 109/74 (05/04 0915) ?SpO2:  [95 %-99 %] 97 % (05/04 0915) ?Weight:  [67.1 kg] 67.1 kg (05/03 1511) ?  ? ?Intake/Output from previous day: ?05/03 0701 - 05/04 0700 ?In: 3435.5 [I.V.:500; IV Piggyback:2935.5] ?Out: -  ?Intake/Output this shift: ?No intake/output data recorded. ? ?PE: ?Gen:  chronically ill appearing female laying in bed in nAD ?Card:  Regular rate and rhythm, pedal pulses 2+ BL ?Pulm:  Normal effort ORA ?Abd: abdomen is firm but not rigid, TTP RUQ with guarding, no peritonitis  ?Skin: warm and dry, no rashes  ?Psych: A&Ox3  ? ?Lab Results:  ?Recent Labs  ?  07/18/21 ?1503 07/19/21 ?6712 07/19/21 ?0459  ?WBC 7.7 3.8*  --   ?HGB 10.9* 11.5* 11.2*  ?HCT 31.7* 34.4* 33.0*  ?PLT 121* 121*  --   ? ?BMET ?Recent Labs  ?  07/18/21 ?1503 07/19/21 ?4580 07/19/21 ?0459  ?NA 129* 130* 131*  ?K 3.6 3.8 3.8  ?CL 102 103  --   ?CO2 16* 18*  --   ?GLUCOSE 191* 178*  --   ?BUN 11 9  --   ?CREATININE 0.77 0.58  --   ?CALCIUM 8.4* 8.3*  --   ? ?PT/INR ?Recent Labs  ?  07/18/21 ?1503 07/19/21 ?9983  ?LABPROT 15.3* 16.3*  ?INR 1.2 1.3*  ? ?CMP  ?   ?Component Value Date/Time  ? NA 131 (L) 07/19/2021 0459  ? NA 139 02/02/2019 1314  ? K 3.8 07/19/2021 0459  ? CL 103 07/19/2021 0445  ? CO2 18 (L) 07/19/2021 0445  ? GLUCOSE 178 (H) 07/19/2021 0445  ? BUN 9 07/19/2021 0445  ? BUN 14 02/02/2019 1314  ? CREATININE 0.58 07/19/2021 0445  ? CREATININE 0.67 07/09/2021 1110  ? CREATININE 0.84 11/08/2019 0827  ? CALCIUM 8.3 (L) 07/19/2021 0445  ? PROT 5.2 (L) 07/19/2021 0445  ? PROT 6.7 02/02/2019 1314  ? ALBUMIN  2.2 (L) 07/19/2021 0445  ? ALBUMIN 4.5 02/02/2019 1314  ? AST 24 07/19/2021 0445  ? AST 22 07/09/2021 1110  ? ALT 18 07/19/2021 0445  ? ALT 52 (H) 07/09/2021 1110  ? ALKPHOS 94 07/19/2021 0445  ? BILITOT 0.9 07/19/2021 0445  ? BILITOT 0.6 07/09/2021 1110  ? GFRNONAA >60 07/19/2021 0445  ? GFRNONAA >60 07/09/2021 1110  ? GFRAA 71 02/02/2019 1314  ? ?Lipase  ?   ?Component Value Date/Time  ? LIPASE 33 06/30/2021 0939  ? ? ? ? ? ?Studies/Results: ?CT Angio Chest PE W and/or Wo Contrast ? ?Result Date: 07/18/2021 ?CLINICAL DATA:  Concern for pulmonary embolism. Abdominal pain and sepsis. History of metastatic pancreatic cancer. EXAM: CT ANGIOGRAPHY CHEST CT ABDOMEN AND PELVIS WITH CONTRAST TECHNIQUE: Multidetector CT imaging of the chest was performed using the standard protocol during bolus administration of intravenous contrast. Multiplanar CT image reconstructions and MIPs were obtained to evaluate the vascular anatomy. Multidetector CT imaging of the abdomen and pelvis was performed using the standard protocol during bolus administration of intravenous contrast. RADIATION DOSE REDUCTION: This exam was performed according to the departmental  dose-optimization program which includes automated exposure control, adjustment of the mA and/or kV according to patient size and/or use of iterative reconstruction technique. CONTRAST:  61m OMNIPAQUE IOHEXOL 350 MG/ML SOLN COMPARISON:  Chest radiograph dated 07/18/2021 and CT abdomen pelvis dated 05/08/2021 and chest CT dated 09/07/2020. FINDINGS: CTA CHEST FINDINGS Cardiovascular: Top-normal cardiac size. No pericardial effusion. There is 3 vessel coronary vascular calcification. Mild atherosclerotic calcification of the thoracic aorta. No aneurysmal dilatation or dissection. The origins of the great vessels of the aortic arch appear patent as visualized. Evaluation of the pulmonary arteries is limited due to respiratory motion artifact. No pulmonary artery embolus identified.  Mediastinum/Nodes: There is no hilar or mediastinal adenopathy. There is a small hiatal hernia. Fluid within the esophagus most consistent with reflux. No mediastinal fluid collection. Right-sided Port-A-Cath with tip in the right atrium close to the cavoatrial junction. Lungs/Pleura: There are bibasilar dependent atelectasis. No focal consolidation, pleural effusion, or pneumothorax. There is a 5 mm left upper lobe nodule (35/6) unchanged since 09/07/2020. Additional 5 mm nodule in the right upper lobe (45/6) also similar to study of 09/07/2020. A 3 mm right upper lobe nodule (79/6) not significantly changed since 09/07/2020. The central airways are patent. Musculoskeletal: Degenerative changes the spine. No acute osseous pathology. Review of the MIP images confirms the above findings. CT ABDOMEN and PELVIS FINDINGS No intra-abdominal free air. Small ascites, increased since 05/08/2021. Hepatobiliary: Ill-defined 7 mm hypodense lesion in the inferior right lobe of the liver (32/3) was present on the prior CT. Additional ill-defined subcentimeter hypodense lesion in the dome of the liver (12/3) noted. There is a 15 x 18 mm hypodense lesion in the inferior right lobe of the liver (43/3) which is not identified with certainty on the prior CT. There is diffuse gallbladder wall thickening and edema. There is irregularity of the gallbladder wall with an apparent area of discontinuity in the posterior aspect of the distal gallbladder wall (40/3). The possibility of gallbladder rupture or perforation is not excluded. Clinical correlation and further evaluation with ultrasound recommended. No calcified gallstone. There is diffuse periportal edema. A biliary stent is again noted in the CBD with associated pneumobilia. Pancreas: Ill-defined hypodensity in the body of the pancreas with associated atrophy of the distal body and tail of the pancreas in keeping with known malignancy. Spleen: Mildly enlarged measuring 14 cm in  length. Adrenals/Urinary Tract: The adrenal glands are unremarkable. There is a 3.5 cm right renal upper pole cyst and a smaller cyst in the interpolar right kidney. There is no hydronephrosis on either side. There is symmetric enhancement and excretion of contrast by both kidneys. The visualized ureters and urinary bladder appear unremarkable. Stomach/Bowel: There is a small hiatal hernia. There is mild thickening of the distal stomach, likely reactive to inflammatory changes of the gallbladder. There is sigmoid diverticulosis without active inflammatory changes. There is segmental thickening and stranding involving the hepatic flexure, likely secondary to inflammatory changes of the gallbladder. There is no bowel obstruction. The appendix is normal. Vascular/Lymphatic: Advanced aortoiliac atherosclerotic disease. The IVC is unremarkable. There is narrowing of the portal vein and portal splenic confluence secondary to vascular encasement by the pancreatic mass with subsequent collateralization. There is probable abutment of the celiac axis by the pancreatic mass. No adenopathy. Reproductive: Hysterectomy.  No adnexal masses. Other: There is extension of small amount of fluid lateral to the right inguinal canal similar to prior CT. Musculoskeletal: Osteopenia with degenerative changes of spine. No acute osseous pathology. Review of  the MIP images confirms the above findings. IMPRESSION: 1. No acute intrathoracic pathology. No pulmonary artery embolus identified. 2. Diffuse gallbladder wall thickening and edema with an apparent area of discontinuity in the posterior aspect of the distal gallbladder wall. The possibility of gallbladder rupture or perforation is not excluded. Clinical correlation and further evaluation with ultrasound recommended. 3. Small ascites, increased since 05/08/2021. 4. Sigmoid diverticulosis. No bowel obstruction. Normal appendix. 5. Ill-defined known pancreatic mass encasing the portal vein  and portal splenic confluence with collateralization 6. Several small bilateral pulmonary nodules, stable since 09/07/2020. 7. There is a 15 x 18 mm hypodense lesion in the inferior right lobe of the liver

## 2021-07-19 NOTE — ED Notes (Signed)
Pt transported to nuclear medicine. 

## 2021-07-19 NOTE — Telephone Encounter (Signed)
TC from Pt wanting to inform Dr Benay Spice that she was admitted to [facility]. Informed Pt that Dr Benay Spice is aware that she is in the hospital and informed her Dr Benay Spice is out of the office until Monday. Pt verbalized understanding.  ?

## 2021-07-19 NOTE — ED Notes (Signed)
Pt is continuing to have pain 7/10 at this time and it is too soon to give her ordered pain med. Sent admit provider message reference same ?

## 2021-07-20 ENCOUNTER — Inpatient Hospital Stay (HOSPITAL_COMMUNITY): Payer: Medicare HMO

## 2021-07-20 DIAGNOSIS — K810 Acute cholecystitis: Secondary | ICD-10-CM | POA: Diagnosis not present

## 2021-07-20 DIAGNOSIS — E039 Hypothyroidism, unspecified: Secondary | ICD-10-CM | POA: Diagnosis not present

## 2021-07-20 DIAGNOSIS — E119 Type 2 diabetes mellitus without complications: Secondary | ICD-10-CM | POA: Diagnosis not present

## 2021-07-20 DIAGNOSIS — G9341 Metabolic encephalopathy: Secondary | ICD-10-CM | POA: Diagnosis not present

## 2021-07-20 HISTORY — PX: IR PERC CHOLECYSTOSTOMY: IMG2326

## 2021-07-20 LAB — CBC WITH DIFFERENTIAL/PLATELET
Abs Immature Granulocytes: 0.14 10*3/uL — ABNORMAL HIGH (ref 0.00–0.07)
Basophils Absolute: 0 10*3/uL (ref 0.0–0.1)
Basophils Relative: 0 %
Eosinophils Absolute: 0 10*3/uL (ref 0.0–0.5)
Eosinophils Relative: 0 %
HCT: 31.8 % — ABNORMAL LOW (ref 36.0–46.0)
Hemoglobin: 10.3 g/dL — ABNORMAL LOW (ref 12.0–15.0)
Immature Granulocytes: 1 %
Lymphocytes Relative: 10 %
Lymphs Abs: 1.1 10*3/uL (ref 0.7–4.0)
MCH: 30.3 pg (ref 26.0–34.0)
MCHC: 32.4 g/dL (ref 30.0–36.0)
MCV: 93.5 fL (ref 80.0–100.0)
Monocytes Absolute: 1.3 10*3/uL — ABNORMAL HIGH (ref 0.1–1.0)
Monocytes Relative: 11 %
Neutro Abs: 8.8 10*3/uL — ABNORMAL HIGH (ref 1.7–7.7)
Neutrophils Relative %: 78 %
Platelets: 170 10*3/uL (ref 150–400)
RBC: 3.4 MIL/uL — ABNORMAL LOW (ref 3.87–5.11)
RDW: 15 % (ref 11.5–15.5)
WBC: 11.4 10*3/uL — ABNORMAL HIGH (ref 4.0–10.5)
nRBC: 0 % (ref 0.0–0.2)

## 2021-07-20 LAB — COMPREHENSIVE METABOLIC PANEL
ALT: 15 U/L (ref 0–44)
AST: 17 U/L (ref 15–41)
Albumin: 1.9 g/dL — ABNORMAL LOW (ref 3.5–5.0)
Alkaline Phosphatase: 73 U/L (ref 38–126)
Anion gap: 11 (ref 5–15)
BUN: 13 mg/dL (ref 8–23)
CO2: 18 mmol/L — ABNORMAL LOW (ref 22–32)
Calcium: 7.9 mg/dL — ABNORMAL LOW (ref 8.9–10.3)
Chloride: 103 mmol/L (ref 98–111)
Creatinine, Ser: 0.89 mg/dL (ref 0.44–1.00)
GFR, Estimated: >60 mL/min (ref >60)
Glucose, Bld: 123 mg/dL — ABNORMAL HIGH (ref 70–99)
Potassium: 3.9 mmol/L (ref 3.5–5.1)
Sodium: 132 mmol/L — ABNORMAL LOW (ref 135–145)
Total Bilirubin: 0.7 mg/dL (ref 0.3–1.2)
Total Protein: 4.9 g/dL — ABNORMAL LOW (ref 6.5–8.1)

## 2021-07-20 LAB — GLUCOSE, CAPILLARY
Glucose-Capillary: 117 mg/dL — ABNORMAL HIGH (ref 70–99)
Glucose-Capillary: 104 mg/dL — ABNORMAL HIGH (ref 70–99)
Glucose-Capillary: 128 mg/dL — ABNORMAL HIGH (ref 70–99)

## 2021-07-20 LAB — MAGNESIUM: Magnesium: 2.1 mg/dL (ref 1.7–2.4)

## 2021-07-20 MED ORDER — LEVOTHYROXINE SODIUM 100 MCG PO TABS
125.0000 ug | ORAL_TABLET | Freq: Every day | ORAL | Status: DC
  Administered 2021-07-21 – 2021-08-01 (×11): 125 ug via ORAL
  Filled 2021-07-20 (×13): qty 1

## 2021-07-20 MED ORDER — LIDOCAINE HCL 1 % IJ SOLN
INTRAMUSCULAR | Status: AC | PRN
  Administered 2021-07-20: 10 mL

## 2021-07-20 MED ORDER — LIDOCAINE HCL 1 % IJ SOLN
INTRAMUSCULAR | Status: AC
  Filled 2021-07-20: qty 20

## 2021-07-20 MED ORDER — FENTANYL CITRATE (PF) 100 MCG/2ML IJ SOLN
INTRAMUSCULAR | Status: AC
  Filled 2021-07-20: qty 2

## 2021-07-20 MED ORDER — ALPRAZOLAM 0.25 MG PO TABS
0.2500 mg | ORAL_TABLET | Freq: Every day | ORAL | Status: DC | PRN
  Administered 2021-08-01: 0.25 mg via ORAL
  Filled 2021-07-20: qty 1

## 2021-07-20 MED ORDER — IOHEXOL 300 MG/ML  SOLN: 100.0000 mL | Freq: Once | INTRAMUSCULAR | Status: DC | PRN

## 2021-07-20 MED ORDER — FENTANYL CITRATE (PF) 100 MCG/2ML IJ SOLN
INTRAMUSCULAR | Status: AC | PRN
  Administered 2021-07-20: 25 ug via INTRAVENOUS

## 2021-07-20 MED ORDER — MIDAZOLAM HCL 2 MG/2ML IJ SOLN
INTRAMUSCULAR | Status: AC | PRN
  Administered 2021-07-20: 1 mg via INTRAVENOUS

## 2021-07-20 MED ORDER — MIDAZOLAM HCL 2 MG/2ML IJ SOLN
INTRAMUSCULAR | Status: AC
  Filled 2021-07-20: qty 2

## 2021-07-20 NOTE — Progress Notes (Signed)
? ? ? [facility] ?                                                                            ? ? ?[name], is a 76 y.o. female, DOB - 09/23/1945, QQP:[id number] ?Admit date - 07/18/2021    ?Outpatient Primary MD for the patient is Toni Arnt, MD ? ?LOS - 2  days ? ? ? ?Brief summary  ? ?[name] is a 76 y.o. female with medical history significant for pancreatic cancer on chemotherapy, GERD, type 2 diabetes mellitus, acquired hypothyroidism, GERD, who is admitted to [facility] on 07/18/2021 with acute cholecystitis after presenting from home to [facility] ED for evaluation of altered mental status.  ? ?Assessment & Plan  ? ? ?Assessment and Plan: ? ?Acute cholecystitis: ?Pt presented with abdominal pain, nausea, decreased oral intake, and CT abd and pelvis showing GB wall thickening with associated GB edema, possible GB perforation on CT. [name]  ?General surgery consulted, unfortunately she is not a surgical candidate. HIDA scan significant for acute cholecystitis. IR consulted and she underwent cholecystostomy.  ?She was empirically started on IV antibiotics.  ?Blood cultures show Enterobacterales and E coli pan sensitive.patient is currently on IV rocephin.  ?Continue with IV fluids, and anti emetics.  ?Started the patient on clear liquid diet.  ? ?Diet-controlled diabetes mellitus ([place]) ?Most recent hemoglobin A1c noted to be 6.3% on 07/10/2021.   ?CBG (last 3)  ?Recent Labs  ?  07/19/21 ?2042 07/20/21 ?7124 07/20/21 ?1207  ?GLUCAP 123* 117* 104*  ? ? ?Resume SSI. No changes in meds.  ? ? ?Acquired hypothyroidism ?Start the patient on synthroid.  ? ? ? ?GAD (generalized anxiety disorder) ?Prn ativan ordered.  ? ? ?Hyponatremia ?Sub-acute hypo-osmolar hyponatremia: Suspect an element of hypovolumeia,.  Differential also includes the possibility of a contribution from SIADH. ?TSH wnl.  ?Much improved to 132.  ? ? ?Acute metabolic encephalopathy ?1 week of somnolence, lethargy, mild  confusion relative to patient's baseline mental status and level of activity. No focal deficits.  ?Possibly secondary to UTI, acute Cholecystitis.  vs from hyponatremia vs from dehydration.  ? ?Hypomagnesemia:  ?Replaced. Repeat level wnl.  ? ? ?Pancytopenia:  ?Mild anemia, thrombocytopenia and wbc count of 3.8. probably from chemotherapy.  ?Improved . ? ?Urinary tract infection:  ?- urine cultures show 1,00,000  e coli.  ?- on rocephin.  ?Adenocarcinoma of the Pancreas, stage 4, on chemotherapy  ?Follows up with Dr [name]. S/p chemo last week.  ?  ? ? ?RN Pressure Injury Documentation: ?Pressure Injury 06/30/21 Thigh Posterior;Proximal;Right Stage 1 -  Intact skin with non-blanchable redness of a localized area usually over a bony prominence. Intact darkened skin right gluteal fold (Active)  ?06/30/21 2000  ?Location: Thigh  ?Location Orientation: Posterior;Proximal;Right  ?Staging: Stage 1 -  Intact skin with non-blanchable redness of a localized area usually over a bony prominence.  ?Wound Description (Comments): Intact darkened skin right gluteal fold  ?Present on Admission: Yes  ?Dressing Type None 07/02/21 1000  ?Local wound care. ? ?Hypertension:  ?BP parameters are well controlled.  ? ? ?COVID -19 infection on 06/20/2021: ?Completed course of antibiotics.  ? ?Estimated body mass  index is 26.22 kg/m? as calculated from the following: ?  Height as of this encounter: '5\' 3"'$  (1.6 m). ?  Weight as of this encounter: 67.1 kg. ? ?Code Status: full code.  ?DVT Prophylaxis:  heparin injection 5,000 Units Start: 07/18/21 2330 ?SCDs Start: 07/18/21 1925 ? ? ?Level of Care: Level of care: Telemetry Medical ?Family Communication: family at bedside.  ? ?Disposition Plan:     Remains inpatient appropriate:IV antibiotics.  ? ?Procedures:  ?HIDA SCAN IR cholecystostomy.  ? ?Consultants:   ?General surgery ?IR.  ? ?Antimicrobials:  ? ?Anti-infectives (From admission, onward)  ? ? Start     Dose/Rate Route Frequency Ordered  Stop  ? 07/19/21 1700  vancomycin (VANCOREADY) IVPB 1250 mg/250 mL  Status:  Discontinued       ? 1,250 mg ?166.7 mL/hr over 90 Minutes Intravenous Every 24 hours 07/18/21 1543 07/18/21 1853  ? 07/19/21 1700  vancomycin (VANCOCIN) IVPB 1000 mg/200 mL premix  Status:  Discontinued       ? 1,000 mg ?200 mL/hr over 60 Minutes Intravenous Every 24 hours 07/18/21 1853 07/18/21 1946  ? 07/19/21 1315  metroNIDAZOLE (FLAGYL) IVPB 500 mg       ? 500 mg ?100 mL/hr over 60 Minutes Intravenous Every 12 hours 07/19/21 1313    ? 07/19/21 1000  cefTRIAXone (ROCEPHIN) 2 g in sodium chloride 0.9 % 100 mL IVPB       ? 2 g ?200 mL/hr over 30 Minutes Intravenous Every 24 hours 07/19/21 0616    ? 07/19/21 0200  metroNIDAZOLE (FLAGYL) IVPB 500 mg  Status:  Discontinued       ? 500 mg ?100 mL/hr over 60 Minutes Intravenous Every 12 hours 07/18/21 1945 07/19/21 0616  ? 07/19/21 0000  ceFEPIme (MAXIPIME) 2 g in sodium chloride 0.9 % 100 mL IVPB  Status:  Discontinued       ? 2 g ?200 mL/hr over 30 Minutes Intravenous Every 12 hours 07/18/21 1607 07/19/21 0616  ? 07/18/21 1700  vancomycin (VANCOREADY) IVPB 1500 mg/300 mL       ? 1,500 mg ?150 mL/hr over 120 Minutes Intravenous  Once 07/18/21 1543 07/18/21 1848  ? 07/18/21 1545  ceFEPIme (MAXIPIME) 2 g in sodium chloride 0.9 % 100 mL IVPB  Status:  Discontinued       ? 2 g ?200 mL/hr over 30 Minutes Intravenous Every 12 hours 07/18/21 1543 07/18/21 1607  ? 07/18/21 1530  aztreonam (AZACTAM) 2 g in sodium chloride 0.9 % 100 mL IVPB  Status:  Discontinued       ? 2 g ?200 mL/hr over 30 Minutes Intravenous  Once 07/18/21 1527 07/18/21 1534  ? 07/18/21 1530  metroNIDAZOLE (FLAGYL) IVPB 500 mg       ? 500 mg ?100 mL/hr over 60 Minutes Intravenous  Once 07/18/21 1527 07/18/21 1652  ? 07/18/21 1530  vancomycin (VANCOCIN) IVPB 1000 mg/200 mL premix  Status:  Discontinued       ? 1,000 mg ?200 mL/hr over 60 Minutes Intravenous  Once 07/18/21 1527 07/18/21 1540  ? ?  ? ? ? ?Medications ? ?Scheduled  Meds: ? heparin injection (subcutaneous)  5,000 Units Subcutaneous Q8H  ? insulin aspart  0-6 Units Subcutaneous Q6H  ? lidocaine      ? ?Continuous Infusions: ? sodium chloride 75 mL/hr at 07/19/21 1803  ? cefTRIAXone (ROCEPHIN)  IV 2 g (07/20/21 0941)  ? metronidazole 500 mg (07/20/21 1407)  ? ?PRN Meds:.acetaminophen **OR** acetaminophen, HYDROmorphone (DILAUDID) injection,  iohexol, LORazepam, naLOXone (NARCAN)  injection, ondansetron (ZOFRAN) IV ? ? ? ?Subjective:  ? ?Caleyah Jr was seen and examined today. Improving pain, no nausea or vomiting.  ?Objective:  ? ?Vitals:  ? 07/20/21 1050 07/20/21 1055 07/20/21 1100 07/20/21 1115  ?BP: 110/62 104/71 106/64 102/62  ?Pulse: (!) 106 (!) 105 (!) 104 98  ?Resp: '18 17 18   '$ ?Temp:      ?TempSrc:      ?SpO2: 98% 99% 96% 97%  ?Weight:      ?Height:      ? ? ?Intake/Output Summary (Last 24 hours) at 07/20/2021 1643 ?Last data filed at 07/20/2021 0900 ?Gross per 24 hour  ?Intake 60.97 ml  ?Output --  ?Net 60.97 ml  ? ? ?Filed Weights  ? 07/18/21 1511  ?Weight: 67.1 kg  ? ? ? ?Exam ?General exam: elderly woman not in distress.  ?Respiratory system: Clear to auscultation. Respiratory effort normal. ?Cardiovascular system: S1 & S2 heard, RRR. No JVD, No pedal edema. ?Gastrointestinal system: Abdomen is soft, improving tenderness. IR drain from the ruq  inplace and draining.  ?Central nervous system: Alert and oriented to place and person. Following commands and answering all questions appropriately.  ?Extremities: Symmetric 5 x 5 power. ?Skin: No rashes, lesions or ulcers ?Psychiatry:  Mood & affect appropriate.  ? ? ? ?Data Reviewed:  I have personally reviewed following labs and imaging studies ? ? ?CBC ?Lab Results  ?Component Value Date  ? WBC 11.4 (H) 07/20/2021  ? RBC 3.40 (L) 07/20/2021  ? HGB 10.3 (L) 07/20/2021  ? HCT 31.8 (L) 07/20/2021  ? MCV 93.5 07/20/2021  ? MCH 30.3 07/20/2021  ? PLT 170 07/20/2021  ? MCHC 32.4 07/20/2021  ? RDW 15.0 07/20/2021  ? LYMPHSABS 1.1  07/20/2021  ? MONOABS 1.3 (H) 07/20/2021  ? EOSABS 0.0 07/20/2021  ? BASOSABS 0.0 07/20/2021  ? ? ? ?Last metabolic panel ?Lab Results  ?Component Value Date  ? NA 132 (L) 07/20/2021  ? K 3.9 07/20/2021  ? CL 103 0

## 2021-07-20 NOTE — H&P (Signed)
? ?Chief Complaint: ?Acute cholecystitis. Request is for cholecystomy tube placement   ? ?Referring Physician(s): ?E. Sima an PA ? ?Supervising Physician: Arne Cleveland ? ?Patient Status: [facility] - In-pt ? ?History of Present Illness: ?[name] is a 76 y.o. female inpatient. History of DM, TIA, HTN, alcohol abuse, pancreatic cancer currently (actively receiving chemotherapy) Recent COVID PNA. Presented to the ED at [facility] on 5.4.23 with AMS, fatigue, cough and anorexia. Found to be septic with gallbladder thickening. CT abd pelvis from 5.4.23 reads Diffuse gallbladder wall thickening and edema with an apparent ?area of discontinuity in the posterior aspect of the distal gallbladder wall. The possibility of gallbladder rupture or perforation is not excluded. Clinical correlation and further evaluation with ultrasound recommended. HIDA scan from 5.5.23 reads Nonvisualized gallbladder despite morphine augmentation, raising concern for an acute cholecystitis. No persistent accumulation of activity at the gallbladder fossa to suggests focal leak or biloma. The few non persistent foci of activity are seen at the inferior right hepatic margin and may represent artifact or contamination. Team is requesting a cholecystomy tube placement. ? ?Patient alert and laying in bed. Family at bedside. Endorses RUQ pain with deep inspiration.  Denies any fevers, headache, chest pain, SOB, cough, abdominal pain, nausea, vomiting or bleeding. Return precautions and treatment recommendations and follow-up discussed with the patient  who is agreeable with the plan. ? ? ? ?Past Medical History:  ?Diagnosis Date  ? Anxiety   ? Arthritis   ? Cancer [facility])   ? pancreatic  ? Depression   ? Diabetes mellitus without complication ([place])   ? Hyperlipemia   ? Hypertension   ? Thyroid disease   ? TIA (transient ischemic attack)   ? Vitamin B12 deficiency 10/20/2019  ? ? ?Past Surgical History:  ?Procedure Laterality Date  ? ABDOMINAL  HYSTERECTOMY    ? BILIARY STENT PLACEMENT N/A 09/28/2020  ? Procedure: BILIARY STENT PLACEMENT;  Surgeon: Clarene Essex, MD;  Location: [facility] ENDOSCOPY;  Service: Endoscopy;  Laterality: N/A;  ? BREAST BIOPSY    ? ERCP N/A 09/28/2020  ? Procedure: ENDOSCOPIC RETROGRADE CHOLANGIOPANCREATOGRAPHY (ERCP);  Surgeon: Clarene Essex, MD;  Location: [facility];  Service: Endoscopy;  Laterality: N/A;  ? IR IMAGING GUIDED PORT INSERTION  09/22/2020  ? SPHINCTEROTOMY  09/28/2020  ? Procedure: SPHINCTEROTOMY;  Surgeon: Clarene Essex, MD;  Location: [facility];  Service: Endoscopy;;  ? TUBAL LIGATION    ? ? ?Allergies: ?Penicillins ? ?Medications: ?Prior to Admission medications   ?Medication Sig Start Date End Date Taking? Authorizing Provider  ?ACCU-CHEK AVIVA PLUS test strip As needed 05/09/21   [name], MD  ?Accu-Chek Softclix Lancets lancets As needed 04/23/21   [name], MD  ?albuterol (VENTOLIN HFA) 108 (90 Base) MCG/ACT inhaler Inhale 2 puffs into the lungs every 6 (six) hours as needed for wheezing or shortness of breath. ?Patient not taking: Reported on 07/10/2021 07/02/21   Flora Lipps, MD  ?Alcohol Swabs PADS USE AS DIRECTED 05/18/21   [name], MD  ?ALPRAZolam Duanne Moron) 0.5 MG tablet Take 1 tablet (0.5 mg total) by mouth daily as needed for anxiety. 11/23/20   Owens Shark, NP  ?amLODipine (NORVASC) 5 MG tablet Take 5 mg by mouth daily. ?Patient not taking: Reported on 07/10/2021 03/30/21   [provider]  ?ascorbic acid (VITAMIN C) 500 MG tablet Take 1 tablet (500 mg total) by mouth daily. ?Patient not taking: Reported on 07/10/2021 07/03/21 08/02/21  Flora Lipps, MD  ?aspirin EC 81 MG  tablet Take 81 mg by mouth every morning.    [provider]  ?Blood Glucose Monitoring Suppl (ACCU-CHEK GUIDE) w/Device KIT As needed 05/11/21   [name], MD  ?cyanocobalamin (,VITAMIN B-12,) 1000 MCG/ML injection Inject 1 mL (1,000 mcg total) into the muscle every 30 (thirty) days. 01/11/21   [name], MD  ?docusate sodium (COLACE) 100 MG capsule Take 100 mg by mouth 2 (two) times daily as needed (constipation).    [provider]  ?guaiFENesin-dextromethorphan (ROBITUSSIN DM) 100-10 MG/5ML syrup Take 10 mLs by mouth every 4 (four) hours as needed for cough. ?Patient not taking: Reported on 07/10/2021 07/02/21   Flora Lipps, MD  ?levothyroxine (SYNTHROID) 125 MCG tablet TAKE 1 TABLET ON AN EMPTY STOMACH IN THE MORNING ?Patient taking differently: Take 125 mcg by mouth daily before breakfast. 06/04/21   [name], MD  ?lidocaine-prilocaine (EMLA) cream APPLY 1 APPLICATION TOPICALLY AS NEEDED. ?Patient taking differently: Apply 1 application. topically daily as needed (numbing). 07/04/21   Ladell Pier, MD  ?omeprazole (PRILOSEC) 20 MG capsule TAKE 1 CAPSULE BY MOUTH EVERY DAY ?Patient taking differently: Take 20 mg by mouth daily. 04/18/21   [name], MD  ?ondansetron (ZOFRAN) 8 MG tablet Take 1 tablet (8 mg total) by mouth 2 (two) times daily as needed (Nausea or vomiting). ?Patient not taking: Reported on 07/10/2021 09/12/20   Truitt Merle, MD  ?prochlorperazine (COMPAZINE) 10 MG tablet Take 1 tablet (10 mg total) by mouth every 6 (six) hours as needed (Nausea or vomiting). 09/12/20   Truitt Merle, MD  ?zinc sulfate 220 (50 Zn) MG capsule Take 1 capsule (220 mg total) by mouth daily. ?Patient not taking: Reported on 07/06/2021 07/03/21 08/02/21  Flora Lipps, MD  ?  ? ?Family History  ?Problem Relation Age of Onset  ? Diabetes Mother   ? Alzheimer's disease Mother   ? Dementia Mother   ? COPD Father   ? Emphysema Father   ? Heart disease Sister   ? Alcohol abuse Brother   ? Heart disease Brother   ? Emphysema Brother   ? Heart attack Daughter   ? Cancer Other   ?     breast cancer  ? ? ?Social History  ? ?Socioeconomic History  ? Marital status: Married  ?  Spouse name: Not on file  ? Number of children: Not on file  ? Years of education: Not on file  ? Highest education level: Not on  file  ?Occupational History  ? Occupation: retired  ?  Comment: Geophysicist/field seismologist  ? Occupation: caregiver  ?Tobacco Use  ? Smoking status: Former  ?  Packs/day: 1.00  ?  Years: 10.00  ?  Pack years: 10.00  ?  Types: Cigarettes  ?  Quit date: 05/25/1987  ?  Years since quitting: 34.1  ? Smokeless tobacco: Never  ?Vaping Use  ? Vaping Use: Never used  ?Substance and Sexual Activity  ? Alcohol use: Yes  ?  Alcohol/week: 21.0 standard drinks  ?  Types: 21 Glasses of wine per week  ?  Comment: 3 glasses of wine daily, she stopped in 08/2020  ? Drug use: No  ? Sexual activity: Not Currently  ?  Birth control/protection: Post-menopausal  ?Other Topics Concern  ? Not on file  ?Social History Narrative  ? Not on file  ? ?Social Determinants of Health  ? ?Financial Resource Strain: Low Risk   ? Difficulty of Paying Living Expenses: Not hard at  all  ?Food Insecurity: No Food Insecurity  ? Worried About Charity fundraiser in the Last Year: Never true  ? Ran Out of Food in the Last Year: Never true  ?Transportation Needs: No Transportation Needs  ? Lack of Transportation (Medical): No  ? Lack of Transportation (Non-Medical): No  ?Physical Activity: Insufficiently Active  ? Days of Exercise per Week: 2 days  ? Minutes of Exercise per Session: 50 min  ?Stress: No Stress Concern Present  ? Feeling of Stress : Only a little  ?Social Connections: Socially Integrated  ? Frequency of Communication with Friends and Family: More than three times a week  ? Frequency of Social Gatherings with Friends and Family: More than three times a week  ? Attends Religious Services: 1 to 4 times per year  ? Active Member of Clubs or Organizations: Yes  ? Attends Archivist Meetings: 1 to 4 times per year  ? Marital Status: Married  ? ? ?Review of Systems: A 12 point ROS discussed and pertinent positives are indicated in the HPI above.  All other systems are negative. ? ?Review of Systems  ?Constitutional:  Negative for fatigue and fever.  ?HENT:   Negative for congestion.   ?Respiratory:  Negative for cough and shortness of breath.   ?Gastrointestinal:  Positive for abdominal pain (RUQ worse with deep inspiration.). Negative for diarrhea, nausea and vomiting.

## 2021-07-20 NOTE — Consult Note (Signed)
? ?                                                                                ?Consultation Note ?Date: 07/20/2021  ? ?Patient Name: [name]  ?DOB: 1946/02/17  MRN: [id number]  Age / Sex: 76 y.o., female  ?PCP: [name], MD ?Referring Physician: Hosie Poisson, MD ? ?Reason for Consultation: Establishing goals of care ? ?HPI/Patient Profile: 76 y.o. female  with past medical history of DM, TIA, HTN, alcohol abuse, pancreatic cancer currently (actively receiving chemotherapy) Recent COVID PNA  admitted on 07/18/2021 with AMS, fatigue, cough and anorexia. ? ?In the ED, CT could not exclude gallbladder rupture or perforation. HIDA scan with concern for acute cholecystitis and patient is s/p chlecystostomy today 5/5. PMT has been consulted to assist with goals of care conversation.  ? ?Clinical Assessment and Goals of Care: ? ?I have reviewed medical records including EPIC notes, labs and imaging, assessed the patient and then met at the bedside along with her daughter to discuss diagnosis prognosis, GOC, EOL wishes, disposition and options. ? ?I introduced Palliative Medicine as specialized medical care for people living with serious illness. It focuses on providing relief from the symptoms and stress of a serious illness. The goal is to improve quality of life for both the patient and the family. ? ?I attempted to elicit values and goals of care important to the patient.   ? ? ?Discussion: ?Patient and her daughter are initially alarmed by involvement of PMT in her care. We discussed the difference between palliative care and hospice, reviewed benefits of early palliative medicine consultation and patient was reassured. She tells me that she is overall receptive to starting goals of care discussions and anticipatory care planning, however today she is very tired and does not have it in her. Her daughter is supportive of anything patient decides. Patient shares with me that she is a [place] and we  discussed potential benefit of a chaplain visit. She agrees this would be helpful at some point for prayer and conversation, but she also feels too tired for this interaction today. Offered to return when I am back on service to see if patient is ready for discussion and she agrees for a follow up visit, voicing her appreciation.  ? ? ?Discussed the importance of continued conversation with family and the medical providers regarding overall plan of care and treatment options, ensuring decisions are within the context of the patient?s values and GOCs.  ? ?Questions and concerns were addressed.  Hard Choices booklet left for review. The family was encouraged to call with questions or concerns.  PMT will continue to support holistically. ? ? ?SUMMARY OF RECOMMENDATIONS   ?-Continue full code/full scope treatment ?-Patient is tired today and wishes to continue discussion on Sunday 5/7 when I am back on service ?-Hard Choices for Loving People booklet provided for review ?-Spiritual care consult declined for the time being ?-PMT will continue to follow ? ?Prognosis:  ?Unable to determine ? ?Discharge Planning: To Be Determined  ? ?  ? ?Primary Diagnoses: ?Present on Admission: ? Acute cholecystitis ? Acute metabolic encephalopathy ? Hyponatremia ? Acquired hypothyroidism ? GAD (  generalized anxiety disorder) ? Cholecystitis ? ? ?I have reviewed the medical record, interviewed the patient and family, and examined the patient. The following aspects are pertinent. ? ?Past Medical History:  ?Diagnosis Date  ? Anxiety   ? Arthritis   ? Cancer [facility])   ? pancreatic  ? Depression   ? Diabetes mellitus without complication ([place])   ? Hyperlipemia   ? Hypertension   ? Thyroid disease   ? TIA (transient ischemic attack)   ? Vitamin B12 deficiency 10/20/2019  ? ?Social History  ? ?Socioeconomic History  ? Marital status: Married  ?  Spouse name: Not on file  ? Number of children: Not on file  ? Years of education: Not on file  ?  Highest education level: Not on file  ?Occupational History  ? Occupation: retired  ?  Comment: Geophysicist/field seismologist  ? Occupation: caregiver  ?Tobacco Use  ? Smoking status: Former  ?  Packs/day: 1.00  ?  Years: 10.00  ?  Pack years: 10.00  ?  Types: Cigarettes  ?  Quit date: 05/25/1987  ?  Years since quitting: 34.1  ? Smokeless tobacco: Never  ?Vaping Use  ? Vaping Use: Never used  ?Substance and Sexual Activity  ? Alcohol use: Yes  ?  Alcohol/week: 21.0 standard drinks  ?  Types: 21 Glasses of wine per week  ?  Comment: 3 glasses of wine daily, she stopped in 08/2020  ? Drug use: No  ? Sexual activity: Not Currently  ?  Birth control/protection: Post-menopausal  ?Other Topics Concern  ? Not on file  ?Social History Narrative  ? Not on file  ? ?Social Determinants of Health  ? ?Financial Resource Strain: Low Risk   ? Difficulty of Paying Living Expenses: Not hard at all  ?Food Insecurity: No Food Insecurity  ? Worried About Charity fundraiser in the Last Year: Never true  ? Ran Out of Food in the Last Year: Never true  ?Transportation Needs: No Transportation Needs  ? Lack of Transportation (Medical): No  ? Lack of Transportation (Non-Medical): No  ?Physical Activity: Insufficiently Active  ? Days of Exercise per Week: 2 days  ? Minutes of Exercise per Session: 50 min  ?Stress: No Stress Concern Present  ? Feeling of Stress : Only a little  ?Social Connections: Socially Integrated  ? Frequency of Communication with Friends and Family: More than three times a week  ? Frequency of Social Gatherings with Friends and Family: More than three times a week  ? Attends Religious Services: 1 to 4 times per year  ? Active Member of Clubs or Organizations: Yes  ? Attends Archivist Meetings: 1 to 4 times per year  ? Marital Status: Married  ? ?Family History  ?Problem Relation Age of Onset  ? Diabetes Mother   ? Alzheimer's disease Mother   ? Dementia Mother   ? COPD Father   ? Emphysema Father   ? Heart disease Sister   ?  Alcohol abuse Brother   ? Heart disease Brother   ? Emphysema Brother   ? Heart attack Daughter   ? Cancer Other   ?     breast cancer  ? ?Scheduled Meds: ? heparin injection (subcutaneous)  5,000 Units Subcutaneous Q8H  ? insulin aspart  0-6 Units Subcutaneous Q6H  ? lidocaine      ? ?Continuous Infusions: ? sodium chloride 75 mL/hr at 07/19/21 1803  ? cefTRIAXone (ROCEPHIN)  IV 2 g (07/20/21  0941)  ? metronidazole 500 mg (07/20/21 0100)  ? ?PRN Meds:.acetaminophen **OR** acetaminophen, HYDROmorphone (DILAUDID) injection, iohexol, LORazepam, naLOXone (NARCAN)  injection, ondansetron (ZOFRAN) IV ?Medications Prior to Admission:  ?Prior to Admission medications   ?Medication Sig Start Date End Date Taking? Authorizing Provider  ?ACCU-CHEK AVIVA PLUS test strip As needed 05/09/21   [name], MD  ?Accu-Chek Softclix Lancets lancets As needed 04/23/21   [name], MD  ?albuterol (VENTOLIN HFA) 108 (90 Base) MCG/ACT inhaler Inhale 2 puffs into the lungs every 6 (six) hours as needed for wheezing or shortness of breath. ?Patient not taking: Reported on 07/10/2021 07/02/21   Flora Lipps, MD  ?Alcohol Swabs PADS USE AS DIRECTED 05/18/21   [name], MD  ?ALPRAZolam Duanne Moron) 0.5 MG tablet Take 1 tablet (0.5 mg total) by mouth daily as needed for anxiety. 11/23/20   Owens Shark, NP  ?amLODipine (NORVASC) 5 MG tablet Take 5 mg by mouth daily. ?Patient not taking: Reported on 07/10/2021 03/30/21   [provider]  ?ascorbic acid (VITAMIN C) 500 MG tablet Take 1 tablet (500 mg total) by mouth daily. ?Patient not taking: Reported on 07/10/2021 07/03/21 08/02/21  Flora Lipps, MD  ?aspirin EC 81 MG tablet Take 81 mg by mouth every morning.    [provider]  ?Blood Glucose Monitoring Suppl (ACCU-CHEK GUIDE) w/Device KIT As needed 05/11/21   [name], MD  ?cyanocobalamin (,VITAMIN B-12,) 1000 MCG/ML injection Inject 1 mL (1,000 mcg total) into the muscle every 30 (thirty) days. 01/11/21    [name], MD  ?docusate sodium (COLACE) 100 MG capsule Take 100 mg by mouth 2 (two) times daily as needed (constipation).    [provider]  ?guaiFENesin-dextromethorphan (ROBITUSSIN DM) 100-10 M

## 2021-07-20 NOTE — Plan of Care (Signed)

## 2021-07-20 NOTE — Progress Notes (Signed)
Patient ID: [name], female   DOB: [birth year], 76 y.o.   MRN: [id number] ?[facility] ?Progress Note ? ?   ?Subjective: ?CC-  ?Continues to have RUQ pain. Pain medication helps. HIDA positive or acute cholecystitis. ?WBC up 11.4, TMAX 99.3 ? ?Objective: ?Vital signs in last 24 hours: ?Temp:  [97.8 ?F (36.6 ?C)-99.3 ?F (37.4 ?C)] 98 ?F (36.7 ?C) (05/05 0750) ?Pulse Rate:  [93-117] 96 (05/05 0750) ?Resp:  [16-25] 16 (05/05 0750) ?BP: (98-132)/(58-102) 107/68 (05/05 0750) ?SpO2:  [95 %-99 %] 98 % (05/05 0750) ?Last BM Date : 07/17/21 ? ?Intake/Output from previous day: ?05/04 0701 - 05/05 0700 ?In: 3138.2 [I.V.:2978.3; IV Piggyback:159.9] ?Out: -  ?Intake/Output this shift: ?No intake/output data recorded. ? ?PE: ?Gen:  Alert, NAD, pleasant ?Abd: soft, TTP RUQ with guarding, no peritonitis  ? ?Lab Results:  ?Recent Labs  ?  07/19/21 ?8250 07/19/21 ?5397 07/20/21 ?0158  ?WBC 3.8*  --  11.4*  ?HGB 11.5* 11.2* 10.3*  ?HCT 34.4* 33.0* 31.8*  ?PLT 121*  --  170  ? ?BMET ?Recent Labs  ?  07/19/21 ?6734 07/19/21 ?1937 07/20/21 ?0158  ?NA 130* 131* 132*  ?K 3.8 3.8 3.9  ?CL 103  --  103  ?CO2 18*  --  18*  ?GLUCOSE 178*  --  123*  ?BUN 9  --  13  ?CREATININE 0.58  --  0.89  ?CALCIUM 8.3*  --  7.9*  ? ?PT/INR ?Recent Labs  ?  07/18/21 ?1503 07/19/21 ?9024  ?LABPROT 15.3* 16.3*  ?INR 1.2 1.3*  ? ?CMP  ?   ?Component Value Date/Time  ? NA 132 (L) 07/20/2021 0158  ? NA 139 02/02/2019 1314  ? K 3.9 07/20/2021 0158  ? CL 103 07/20/2021 0158  ? CO2 18 (L) 07/20/2021 0158  ? GLUCOSE 123 (H) 07/20/2021 0158  ? BUN 13 07/20/2021 0158  ? BUN 14 02/02/2019 1314  ? CREATININE 0.89 07/20/2021 0158  ? CREATININE 0.67 07/09/2021 1110  ? CREATININE 0.84 11/08/2019 0827  ? CALCIUM 7.9 (L) 07/20/2021 0158  ? PROT 4.9 (L) 07/20/2021 0158  ? PROT 6.7 02/02/2019 1314  ? ALBUMIN 1.9 (L) 07/20/2021 0158  ? ALBUMIN 4.5 02/02/2019 1314  ? AST 17 07/20/2021 0158  ? AST 22 07/09/2021 1110  ? ALT 15 07/20/2021 0158  ? ALT 52 (H)  07/09/2021 1110  ? ALKPHOS 73 07/20/2021 0158  ? BILITOT 0.7 07/20/2021 0158  ? BILITOT 0.6 07/09/2021 1110  ? GFRNONAA >60 07/20/2021 0158  ? GFRNONAA >60 07/09/2021 1110  ? GFRAA 71 02/02/2019 1314  ? ?Lipase  ?   ?Component Value Date/Time  ? LIPASE 33 06/30/2021 0939  ? ? ? ? ? ?Studies/Results: ?CT Angio Chest PE W and/or Wo Contrast ? ?Result Date: 07/18/2021 ?CLINICAL DATA:  Concern for pulmonary embolism. Abdominal pain and sepsis. History of metastatic pancreatic cancer. EXAM: CT ANGIOGRAPHY CHEST CT ABDOMEN AND PELVIS WITH CONTRAST TECHNIQUE: Multidetector CT imaging of the chest was performed using the standard protocol during bolus administration of intravenous contrast. Multiplanar CT image reconstructions and MIPs were obtained to evaluate the vascular anatomy. Multidetector CT imaging of the abdomen and pelvis was performed using the standard protocol during bolus administration of intravenous contrast. RADIATION DOSE REDUCTION: This exam was performed according to the departmental dose-optimization program which includes automated exposure control, adjustment of the mA and/or kV according to patient size and/or use of iterative reconstruction technique. CONTRAST:  84m OMNIPAQUE IOHEXOL 350 MG/ML SOLN COMPARISON:  Chest radiograph  dated 07/18/2021 and CT abdomen pelvis dated 05/08/2021 and chest CT dated 09/07/2020. FINDINGS: CTA CHEST FINDINGS Cardiovascular: Top-normal cardiac size. No pericardial effusion. There is 3 vessel coronary vascular calcification. Mild atherosclerotic calcification of the thoracic aorta. No aneurysmal dilatation or dissection. The origins of the great vessels of the aortic arch appear patent as visualized. Evaluation of the pulmonary arteries is limited due to respiratory motion artifact. No pulmonary artery embolus identified. Mediastinum/Nodes: There is no hilar or mediastinal adenopathy. There is a small hiatal hernia. Fluid within the esophagus most consistent with  reflux. No mediastinal fluid collection. Right-sided Port-A-Cath with tip in the right atrium close to the cavoatrial junction. Lungs/Pleura: There are bibasilar dependent atelectasis. No focal consolidation, pleural effusion, or pneumothorax. There is a 5 mm left upper lobe nodule (35/6) unchanged since 09/07/2020. Additional 5 mm nodule in the right upper lobe (45/6) also similar to study of 09/07/2020. A 3 mm right upper lobe nodule (79/6) not significantly changed since 09/07/2020. The central airways are patent. Musculoskeletal: Degenerative changes the spine. No acute osseous pathology. Review of the MIP images confirms the above findings. CT ABDOMEN and PELVIS FINDINGS No intra-abdominal free air. Small ascites, increased since 05/08/2021. Hepatobiliary: Ill-defined 7 mm hypodense lesion in the inferior right lobe of the liver (32/3) was present on the prior CT. Additional ill-defined subcentimeter hypodense lesion in the dome of the liver (12/3) noted. There is a 15 x 18 mm hypodense lesion in the inferior right lobe of the liver (43/3) which is not identified with certainty on the prior CT. There is diffuse gallbladder wall thickening and edema. There is irregularity of the gallbladder wall with an apparent area of discontinuity in the posterior aspect of the distal gallbladder wall (40/3). The possibility of gallbladder rupture or perforation is not excluded. Clinical correlation and further evaluation with ultrasound recommended. No calcified gallstone. There is diffuse periportal edema. A biliary stent is again noted in the CBD with associated pneumobilia. Pancreas: Ill-defined hypodensity in the body of the pancreas with associated atrophy of the distal body and tail of the pancreas in keeping with known malignancy. Spleen: Mildly enlarged measuring 14 cm in length. Adrenals/Urinary Tract: The adrenal glands are unremarkable. There is a 3.5 cm right renal upper pole cyst and a smaller cyst in the  interpolar right kidney. There is no hydronephrosis on either side. There is symmetric enhancement and excretion of contrast by both kidneys. The visualized ureters and urinary bladder appear unremarkable. Stomach/Bowel: There is a small hiatal hernia. There is mild thickening of the distal stomach, likely reactive to inflammatory changes of the gallbladder. There is sigmoid diverticulosis without active inflammatory changes. There is segmental thickening and stranding involving the hepatic flexure, likely secondary to inflammatory changes of the gallbladder. There is no bowel obstruction. The appendix is normal. Vascular/Lymphatic: Advanced aortoiliac atherosclerotic disease. The IVC is unremarkable. There is narrowing of the portal vein and portal splenic confluence secondary to vascular encasement by the pancreatic mass with subsequent collateralization. There is probable abutment of the celiac axis by the pancreatic mass. No adenopathy. Reproductive: Hysterectomy.  No adnexal masses. Other: There is extension of small amount of fluid lateral to the right inguinal canal similar to prior CT. Musculoskeletal: Osteopenia with degenerative changes of spine. No acute osseous pathology. Review of the MIP images confirms the above findings. IMPRESSION: 1. No acute intrathoracic pathology. No pulmonary artery embolus identified. 2. Diffuse gallbladder wall thickening and edema with an apparent area of discontinuity in the posterior aspect  of the distal gallbladder wall. The possibility of gallbladder rupture or perforation is not excluded. Clinical correlation and further evaluation with ultrasound recommended. 3. Small ascites, increased since 05/08/2021. 4. Sigmoid diverticulosis. No bowel obstruction. Normal appendix. 5. Ill-defined known pancreatic mass encasing the portal vein and portal splenic confluence with collateralization 6. Several small bilateral pulmonary nodules, stable since 09/07/2020. 7. There is a 15  x 18 mm hypodense lesion in the inferior right lobe of the liver (43/3) which is not identified with certainty on the prior CT. 8. Aortic Atherosclerosis (ICD10-I70.0). Electronically Signed   By: Laren Everts

## 2021-07-20 NOTE — Procedures (Signed)
Interventional Radiology Procedure Note ? ?Procedure: chlecystostomy   ? ?Complications: None ? ?Estimated Blood Loss:  min ? ?Findings: ?Purulent bile sent for cx   ? ?M. Daryll Brod, MD ? ? ? ?

## 2021-07-20 NOTE — Consult Note (Signed)
? ?  [facility] CM Inpatient Consult ? ? ?07/20/2021 ? ?[name] ?[birth year] ?[id number] ? ?[facility] [ACO] Patient: [employer] ? ?Primary Care Provider:  [name], MD is an Embedded provider at [facility] which is listed to provide the [facility] calls and follow up ? ?Patient screened for less than 30 days readmission hospitalization with noted high risk score for unplanned readmission risk and to assess for potential Embeded Chronic Care Management service needs for post hospital transition.  Review of patient's medical record reveals patient is s/p procedure. ? ? ?Plan:  Continue to follow progress and disposition to assess for post hospital care management needs.   ? ?For questions contact:  ? ?Natividad Brood, RN BSN CCM ?[facility] Liaison ? [phone] business mobile phone ?Toll free office [phone]  ?Fax number: [phone] ?[email or web address]  ? ? ? ?

## 2021-07-20 NOTE — TOC Progression Note (Signed)
Transition of Care (TOC) - Progression Note  ? ? ?Patient Details  ?Name: JHADA RISK ?MRN: [id number] ?Date of Birth: [birth year] ? ?Transition of Care (TOC) CM/SW Contact  ?Marilu Favre, RN ?Phone Number: ?07/20/2021, 10:06 AM ? ?Clinical Narrative:    ? ? ?Transition of Care (TOC) Screening Note ? ? ?Patient Details  ?Name: ARMINDA FOGLIO ?Date of Birth: 1945-06-25 ? ? ?Transition of Care Department [facility]) has reviewed patient and no TOC needs have been identified at this time. We will continue to monitor patient advancement through interdisciplinary progression rounds. If new patient transition needs arise, please place a TOC consult. ?  ? ?  ?  ? ?Expected Discharge Plan and Services ?  ?  ?  ?  ?  ?                ?  ?  ?  ?  ?  ?  ?  ?  ?  ?  ? ? ?Social Determinants of Health (SDOH) Interventions ?  ? ?Readmission Risk Interventions ?   ? View : No data to display.  ?  ?  ?  ? ? ?

## 2021-07-21 ENCOUNTER — Other Ambulatory Visit: Payer: Self-pay | Admitting: Oncology

## 2021-07-21 DIAGNOSIS — G9341 Metabolic encephalopathy: Secondary | ICD-10-CM | POA: Diagnosis not present

## 2021-07-21 DIAGNOSIS — E039 Hypothyroidism, unspecified: Secondary | ICD-10-CM | POA: Diagnosis not present

## 2021-07-21 DIAGNOSIS — E119 Type 2 diabetes mellitus without complications: Secondary | ICD-10-CM | POA: Diagnosis not present

## 2021-07-21 DIAGNOSIS — K810 Acute cholecystitis: Secondary | ICD-10-CM | POA: Diagnosis not present

## 2021-07-21 LAB — CBC WITH DIFFERENTIAL/PLATELET
Abs Immature Granulocytes: 0.1 10*3/uL — ABNORMAL HIGH (ref 0.00–0.07)
Basophils Absolute: 0 10*3/uL (ref 0.0–0.1)
Basophils Relative: 0 %
Eosinophils Absolute: 0.1 10*3/uL (ref 0.0–0.5)
Eosinophils Relative: 1 %
HCT: 31.4 % — ABNORMAL LOW (ref 36.0–46.0)
Hemoglobin: 10.2 g/dL — ABNORMAL LOW (ref 12.0–15.0)
Lymphocytes Relative: 5 %
Lymphs Abs: 0.6 10*3/uL — ABNORMAL LOW (ref 0.7–4.0)
MCH: 30 pg (ref 26.0–34.0)
MCHC: 32.5 g/dL (ref 30.0–36.0)
MCV: 92.4 fL (ref 80.0–100.0)
Monocytes Absolute: 1.2 10*3/uL — ABNORMAL HIGH (ref 0.1–1.0)
Monocytes Relative: 10 %
Neutro Abs: 9.7 10*3/uL — ABNORMAL HIGH (ref 1.7–7.7)
Neutrophils Relative %: 83 %
Platelets: 188 10*3/uL (ref 150–400)
Promyelocytes Relative: 1 %
RBC: 3.4 MIL/uL — ABNORMAL LOW (ref 3.87–5.11)
RDW: 15.2 % (ref 11.5–15.5)
WBC: 11.7 10*3/uL — ABNORMAL HIGH (ref 4.0–10.5)
nRBC: 0 % (ref 0.0–0.2)
nRBC: 0 /100 WBC

## 2021-07-21 LAB — COMPREHENSIVE METABOLIC PANEL
ALT: 12 U/L (ref 0–44)
AST: 12 U/L — ABNORMAL LOW (ref 15–41)
Albumin: 1.8 g/dL — ABNORMAL LOW (ref 3.5–5.0)
Alkaline Phosphatase: 73 U/L (ref 38–126)
Anion gap: 9 (ref 5–15)
BUN: 12 mg/dL (ref 8–23)
CO2: 17 mmol/L — ABNORMAL LOW (ref 22–32)
Calcium: 7.8 mg/dL — ABNORMAL LOW (ref 8.9–10.3)
Chloride: 105 mmol/L (ref 98–111)
Creatinine, Ser: 0.7 mg/dL (ref 0.44–1.00)
GFR, Estimated: >60 mL/min (ref >60)
Glucose, Bld: 140 mg/dL — ABNORMAL HIGH (ref 70–99)
Potassium: 3.4 mmol/L — ABNORMAL LOW (ref 3.5–5.1)
Sodium: 131 mmol/L — ABNORMAL LOW (ref 135–145)
Total Bilirubin: 0.7 mg/dL (ref 0.3–1.2)
Total Protein: 4.8 g/dL — ABNORMAL LOW (ref 6.5–8.1)

## 2021-07-21 LAB — URINE CULTURE: Culture: >=100000 — AB

## 2021-07-21 LAB — GLUCOSE, CAPILLARY
Glucose-Capillary: 126 mg/dL — ABNORMAL HIGH (ref 70–99)
Glucose-Capillary: 151 mg/dL — ABNORMAL HIGH (ref 70–99)
Glucose-Capillary: 154 mg/dL — ABNORMAL HIGH (ref 70–99)
Glucose-Capillary: 171 mg/dL — ABNORMAL HIGH (ref 70–99)

## 2021-07-21 MED ORDER — POLYETHYLENE GLYCOL 3350 17 G PO PACK
17.0000 g | PACK | Freq: Every day | ORAL | Status: DC
  Administered 2021-07-25: 17 g via ORAL
  Filled 2021-07-21 (×7): qty 1

## 2021-07-21 MED ORDER — BOOST / RESOURCE BREEZE PO LIQD CUSTOM
1.0000 | Freq: Three times a day (TID) | ORAL | Status: DC
Start: 2021-07-21 — End: 2021-07-26
  Administered 2021-07-21 – 2021-07-25 (×4): 1 via ORAL

## 2021-07-21 MED ORDER — FOSFOMYCIN TROMETHAMINE 3 G PO PACK
3.0000 g | PACK | Freq: Once | ORAL | Status: AC
  Administered 2021-07-21: 3 g via ORAL
  Filled 2021-07-21: qty 3

## 2021-07-21 MED ORDER — POTASSIUM CHLORIDE CRYS ER 20 MEQ PO TBCR
40.0000 meq | EXTENDED_RELEASE_TABLET | Freq: Once | ORAL | Status: AC
  Administered 2021-07-21: 40 meq via ORAL
  Filled 2021-07-21: qty 2

## 2021-07-21 MED ORDER — SENNOSIDES-DOCUSATE SODIUM 8.6-50 MG PO TABS
2.0000 | ORAL_TABLET | Freq: Two times a day (BID) | ORAL | Status: DC
  Administered 2021-07-21 – 2021-07-29 (×9): 2 via ORAL
  Filled 2021-07-21 (×20): qty 2

## 2021-07-21 MED ORDER — SODIUM CHLORIDE 0.9 % IV SOLN
2.0000 g | Freq: Two times a day (BID) | INTRAVENOUS | Status: DC
  Administered 2021-07-21 – 2021-08-01 (×23): 2 g via INTRAVENOUS
  Filled 2021-07-21 (×23): qty 12.5

## 2021-07-21 MED ORDER — TRAMADOL HCL 50 MG PO TABS
50.0000 mg | ORAL_TABLET | Freq: Four times a day (QID) | ORAL | Status: DC | PRN
  Administered 2021-07-22 – 2021-07-25 (×4): 50 mg via ORAL
  Filled 2021-07-21 (×9): qty 1

## 2021-07-21 NOTE — Progress Notes (Signed)
? ? ? [facility] ?                                                                            ? ? ?[name], is a 76 y.o. female, DOB - October 02, 1945, TIW:[id number] ?Admit date - 07/18/2021    ?Outpatient Primary MD for the patient is [name], MD ? ?LOS - 3  days ? ? ? ?Brief summary  ? ?[name] is a 76 y.o. female with medical history significant for pancreatic cancer on chemotherapy, GERD, type 2 diabetes mellitus, acquired hypothyroidism, GERD, who is admitted to [facility] on 07/18/2021 with acute cholecystitis after presenting from home to [facility] ED for evaluation of altered mental status.  ? ?Assessment & Plan  ? ? ?Assessment and Plan: ?Severe sepsis from UTI, acute cholecystitis and E coli bacteremia:  ?On admission pt was febrile, tachycardic >90/ min, lactic acid >2, wbc count <4000 , altered mental status.  Blood cultures positive for E coli.  ?Pt presented with abdominal pain, nausea, decreased oral intake, and CT abd and pelvis showing GB wall thickening with associated GB edema, possible GB perforation on CT. Marland Kitchen  ?General surgery consulted, unfortunately she is not a surgical candidate. HIDA scan significant for acute cholecystitis. IR consulted and she underwent cholecystostomy.  ?She was empirically started on IV antibiotics.  ?Blood cultures show Enterobacterales and E coli pan sensitive.patient is currently on IV rocephin.  ?Continue with IV fluids, and anti emetics.  ?Started the patient on clear liquid diet, advance as tolerated.  ? ?Diet-controlled diabetes mellitus ([place]) ?Most recent hemoglobin A1c noted to be 6.3% on 07/10/2021.   ?CBG (last 3)  ?Recent Labs  ?  07/21/21 ?0004 07/21/21 ?2505 07/21/21 ?1127  ?GLUCAP 154* 126* 151*  ? ? ?Resume SSI. No changes in meds.  ? ? ?Acquired hypothyroidism ?Started the patient on synthroid.  ? ?GAD (generalized anxiety disorder) ?Prn ativan ordered.  ? ?Hyponatremia ?Sub-acute hypo-osmolar hyponatremia: Suspect an element  of hypovolumeia,.  Differential also includes the possibility of a contribution from SIADH. ?TSH wnl.  ?Much improved to 132.  ? ? ?Acute metabolic encephalopathy ?Appears to have resolved.  ?Possibly secondary to UTI, acute Cholecystitis.  vs from hyponatremia vs from dehydration.  ? ?Hypomagnesemia:  ?Replaced. Repeat level wnl.  ? ? ?Pancytopenia:  ?Mild anemia, thrombocytopenia and wbc count of 3.8. probably from chemotherapy.  ?Improved . ? ?Urinary tract infection:  ?- urine cultures show 1,00,000  e coli.  ?- on rocephin.  ?Adenocarcinoma of the Pancreas, stage 4, on chemotherapy  ?Follows up with Dr Benay Spice. S/p chemo last week.  ?  ?Hypokalemia:  ?Replaced.  ? ?RN Pressure Injury Documentation: ?Pressure Injury 06/30/21 Thigh Posterior;Proximal;Right Stage 1 -  Intact skin with non-blanchable redness of a localized area usually over a bony prominence. Intact darkened skin right gluteal fold (Active)  ?06/30/21 2000  ?Location: Thigh  ?Location Orientation: Posterior;Proximal;Right  ?Staging: Stage 1 -  Intact skin with non-blanchable redness of a localized area usually over a bony prominence.  ?Wound Description (Comments): Intact darkened skin right gluteal fold  ?Present on Admission: Yes  ?Dressing Type None 07/20/21 2100  ?Local wound care. ? ?Hypertension:  ?BP  parameters are well controlled.  ? ? ?COVID -19 infection on 06/20/2021: ?Completed course of antibiotics.  ? ?Estimated body mass index is 26.17 kg/m? as calculated from the following: ?  Height as of this encounter: '5\' 3"'$  (1.6 m). ?  Weight as of this encounter: 67 kg. ? ?Code Status: full code.  ?DVT Prophylaxis:  heparin injection 5,000 Units Start: 07/18/21 2330 ?SCDs Start: 07/18/21 1925 ? ? ?Level of Care: Level of care: Telemetry Medical ?Family Communication: family at bedside.  ? ?Disposition Plan:     Remains inpatient appropriate:IV antibiotics.  ? ?Procedures:  ?HIDA SCAN IR cholecystostomy.  ? ?Consultants:   ?General surgery ?IR.   ? ?Antimicrobials:  ? ?Anti-infectives (From admission, onward)  ? ? Start     Dose/Rate Route Frequency Ordered Stop  ? 07/19/21 1700  vancomycin (VANCOREADY) IVPB 1250 mg/250 mL  Status:  Discontinued       ? 1,250 mg ?166.7 mL/hr over 90 Minutes Intravenous Every 24 hours 07/18/21 1543 07/18/21 1853  ? 07/19/21 1700  vancomycin (VANCOCIN) IVPB 1000 mg/200 mL premix  Status:  Discontinued       ? 1,000 mg ?200 mL/hr over 60 Minutes Intravenous Every 24 hours 07/18/21 1853 07/18/21 1946  ? 07/19/21 1315  metroNIDAZOLE (FLAGYL) IVPB 500 mg       ? 500 mg ?100 mL/hr over 60 Minutes Intravenous Every 12 hours 07/19/21 1313    ? 07/19/21 1000  cefTRIAXone (ROCEPHIN) 2 g in sodium chloride 0.9 % 100 mL IVPB       ? 2 g ?200 mL/hr over 30 Minutes Intravenous Every 24 hours 07/19/21 0616    ? 07/19/21 0200  metroNIDAZOLE (FLAGYL) IVPB 500 mg  Status:  Discontinued       ? 500 mg ?100 mL/hr over 60 Minutes Intravenous Every 12 hours 07/18/21 1945 07/19/21 0616  ? 07/19/21 0000  ceFEPIme (MAXIPIME) 2 g in sodium chloride 0.9 % 100 mL IVPB  Status:  Discontinued       ? 2 g ?200 mL/hr over 30 Minutes Intravenous Every 12 hours 07/18/21 1607 07/19/21 0616  ? 07/18/21 1700  vancomycin (VANCOREADY) IVPB 1500 mg/300 mL       ? 1,500 mg ?150 mL/hr over 120 Minutes Intravenous  Once 07/18/21 1543 07/18/21 1848  ? 07/18/21 1545  ceFEPIme (MAXIPIME) 2 g in sodium chloride 0.9 % 100 mL IVPB  Status:  Discontinued       ? 2 g ?200 mL/hr over 30 Minutes Intravenous Every 12 hours 07/18/21 1543 07/18/21 1607  ? 07/18/21 1530  aztreonam (AZACTAM) 2 g in sodium chloride 0.9 % 100 mL IVPB  Status:  Discontinued       ? 2 g ?200 mL/hr over 30 Minutes Intravenous  Once 07/18/21 1527 07/18/21 1534  ? 07/18/21 1530  metroNIDAZOLE (FLAGYL) IVPB 500 mg       ? 500 mg ?100 mL/hr over 60 Minutes Intravenous  Once 07/18/21 1527 07/18/21 1652  ? 07/18/21 1530  vancomycin (VANCOCIN) IVPB 1000 mg/200 mL premix  Status:  Discontinued       ? 1,000  mg ?200 mL/hr over 60 Minutes Intravenous  Once 07/18/21 1527 07/18/21 1540  ? ?  ? ? ? ?Medications ? ?Scheduled Meds: ? feeding supplement  1 Container Oral TID BM  ? heparin injection (subcutaneous)  5,000 Units Subcutaneous Q8H  ? insulin aspart  0-6 Units Subcutaneous Q6H  ? levothyroxine  125 mcg Oral QAC breakfast  ? polyethylene glycol  17 g Oral Daily  ? senna-docusate  2 tablet Oral BID  ? ?Continuous Infusions: ? sodium chloride 75 mL/hr at 07/19/21 1803  ? cefTRIAXone (ROCEPHIN)  IV 2 g (07/21/21 0844)  ? metronidazole 500 mg (07/21/21 1207)  ? ?PRN Meds:.acetaminophen **OR** acetaminophen, ALPRAZolam, HYDROmorphone (DILAUDID) injection, iohexol, naLOXone (NARCAN)  injection, ondansetron (ZOFRAN) IV, traMADol ? ? ? ?Subjective:  ? ?Anvita Hirata was seen and examined today. No new complaints.  ?Objective:  ? ?Vitals:  ? 07/20/21 2124 07/21/21 0500 07/21/21 0541 07/21/21 0748  ?BP: 120/63  114/71 118/73  ?Pulse: (!) 103  89 86  ?Resp: '18  18 18  '$ ?Temp: 98 ?F (36.7 ?C)  97.8 ?F (36.6 ?C) 98 ?F (36.7 ?C)  ?TempSrc: Oral  Oral   ?SpO2: 95%  98% 96%  ?Weight:  67 kg    ?Height:      ? ? ?Intake/Output Summary (Last 24 hours) at 07/21/2021 1210 ?Last data filed at 07/21/2021 [phone] ?Gross per 24 hour  ?Intake 220 ml  ?Output 50 ml  ?Net 170 ml  ? ? ?Filed Weights  ? 07/18/21 1511 07/21/21 0500  ?Weight: 67.1 kg 67 kg  ? ? ? ?Exam ?General exam: Appears calm and comfortable  ?Respiratory system: Clear to auscultation. Respiratory effort normal. ?Cardiovascular system: S1 & S2 heard, RRR. No JVD, No pedal edema. ?Gastrointestinal system: Abdomen is soft, tenderness improving. IR drain in place.  ?Central nervous system: Alert and oriented. No focal neurological deficits. ?Extremities: Symmetric 5 x 5 power. ?Skin: No rashes, lesions or ulcers ?Psychiatry:  Mood & affect appropriate.  ? ? ? ? ?Data Reviewed:  I have personally reviewed following labs and imaging studies ? ? ?CBC ?Lab Results  ?Component Value Date  ?  WBC 11.7 (H) 07/21/2021  ? RBC 3.40 (L) 07/21/2021  ? HGB 10.2 (L) 07/21/2021  ? HCT 31.4 (L) 07/21/2021  ? MCV 92.4 07/21/2021  ? MCH 30.0 07/21/2021  ? PLT 188 07/21/2021  ? MCHC 32.5 07/21/2021  ? RD

## 2021-07-21 NOTE — Consult Note (Addendum)
? ? ?[facility]  ?                                                                                     ? ?Patient Identification: ?Patient Name: [name] MRN: [id number] [place] Date: 07/18/2021  2:47 PM ?Today's Date: 07/21/2021 ?Reason for consult: GNR bacteremia ?Requesting provider: Hosie Poisson ? ?Principal Problem: ?  Acute cholecystitis ?Active Problems: ?  Acquired hypothyroidism ?  GAD (generalized anxiety disorder) ?  Diet-controlled diabetes mellitus ([place]) ?  Acute metabolic encephalopathy ?  Hyponatremia ?  Cholecystitis ? ?Antibiotics:  ?Vancomycin 5/3 ?Cefepime 5/3, Aztreonam 5/3, Ceftriaxone 5/4-c ?Metronidazole 5/3-c ? ?Lines/Hardware: Rt chest port, GI stent, RT UQ drain ? ?Assessment ?# Morganella morganii/E coli  bacteremia 2/2 ? ?# Acute cholecystitis S/p cholecystostomy by IR 5/5. Cx with Morganella morganii ? ?# Urine cx positive for E coli and E faecium - no GU symptoms, no indication to treat ? ?# Pancreatic ca on chemotherapy ( last chemo gemcitabine/abraxane 4/24 and next due 5/9) ? ?# DM ( a1c 6.3) ? ?Recommendations  ?Continue cefepime as is  ?Fu sensitivities for de-escalation and drain cultures  ?Drain management per IR ?Monitor drain output ?Monitor CBC and CMP ?Following ? ?Rest of the management as per the primary team. Please call with questions or concerns.  ?Thank you for the consult ? ?Rosiland Oz, MD ?Infectious Disease Physician ?[facility] ?[address] ?[place], [place] [address] ?Phone: [phone]  Fax: [phone] ? ?__________________________________________________________________________________________________________ ?HPI and Hospital Course: ?76 Y O Female with PMH of Pancreatic ca on chemotherapy, GERD, Type 2 DM, TIA, HTN, acquired hypothyroidism, alcohol abuse, COVID 19  who presented to the ED from home 5/3  for AMS and progressively worsening fatigue/abdominal pain/nausea for a week with associated decrease on po appetite. Patient was recently hospitalized for COVID 19 in April withh persistent residual cough. CT abd/pelvis for Rt UQ pain: diffuse gallbladder wall thickening and edema with an apparent ?area of discontinuity in the posterior aspect of the distal gallbladder wall. The possibility of gallbladder rupture or perforation is not excluded. NM scan 5/4 concerning for acute cholecystitis. Poor surgical candidate per General surgery. S/p IR guided percutaneous cholecystostomy 5/5 ( Cx GNR and Morganella morganii) ? ?At ED 100.3, WBC 7 ?Blood cx 1/2 sets E coli and 2/2 Morganella morganii ?Urine cx E coli and E faecium , UA 0-5 WBCs ? ?She has a history of pancreatic cancer, for which she follows with Dr. Benay Spice as her outpatient oncologist, actively undergoing chemotherapy, with next chemotherapy session scheduled for 07/24/2021. she underwent ERCP with biliary stent placement in sphincterectomy via Dr.Magod on 09/28/20.  ? ?ROS: very limited as patient is upset about her pain not being controlled and her medical care. Abdominal pain. She wants to eat.  ? ?Past Medical History:  ?Diagnosis Date  ? Anxiety   ? Arthritis   ? Cancer [facility])   ? pancreatic  ? Depression   ? Diabetes mellitus without complication ([place])   ? Hyperlipemia   ? Hypertension   ? Thyroid disease   ? TIA (transient ischemic attack)   ?  Vitamin B12 deficiency 10/20/2019  ? ?Past Surgical History:  ?Procedure Laterality Date  ? ABDOMINAL HYSTERECTOMY    ? BILIARY STENT PLACEMENT N/A 09/28/2020  ? Procedure: BILIARY STENT PLACEMENT;  Surgeon: Clarene Essex, MD;  Location: [facility];  Service: Endoscopy;  Laterality: N/A;  ? BREAST BIOPSY    ? ERCP N/A 09/28/2020  ? Procedure: ENDOSCOPIC RETROGRADE CHOLANGIOPANCREATOGRAPHY (ERCP);  Surgeon: Clarene Essex, MD;  Location: [facility];  Service: Endoscopy;  Laterality: N/A;  ? IR IMAGING GUIDED PORT  INSERTION  09/22/2020  ? IR PERC CHOLECYSTOSTOMY  07/20/2021  ? SPHINCTEROTOMY  09/28/2020  ? Procedure: SPHINCTEROTOMY;  Surgeon: Clarene Essex, MD;  Location: [facility];  Service: Endoscopy;;  ? TUBAL LIGATION    ? ? ? ?Scheduled Meds: ? feeding supplement  1 Container Oral TID BM  ? fosfomycin  3 g Oral Once  ? heparin injection (subcutaneous)  5,000 Units Subcutaneous Q8H  ? insulin aspart  0-6 Units Subcutaneous Q6H  ? levothyroxine  125 mcg Oral QAC breakfast  ? polyethylene glycol  17 g Oral Daily  ? senna-docusate  2 tablet Oral BID  ? ?Continuous Infusions: ? sodium chloride 75 mL/hr at 07/19/21 1803  ? ceFEPime (MAXIPIME) IV 2 g (07/21/21 1417)  ? metronidazole 500 mg (07/21/21 1207)  ? ?PRN Meds:.acetaminophen **OR** acetaminophen, ALPRAZolam, HYDROmorphone (DILAUDID) injection, iohexol, naLOXone (NARCAN)  injection, ondansetron (ZOFRAN) IV, traMADol ? ?Allergies  ?Allergen Reactions  ? Penicillins Hives and Other (See Comments)  ?  Tolerated Ceftriaxone 2023 ? ?Has patient had a PCN reaction causing immediate rash, facial/tongue/throat swelling, SOB or lightheadedness with hypotension: No ?Has patient had a PCN reaction causing severe rash involving mucus membranes or skin necrosis: No ?Has patient had a PCN reaction that required hospitalization No ?Has patient had a PCN reaction occurring within the last 10 years: No ?If all of the above answers are "NO", then may proceed with Cephalosporin use.  ? ?Social History  ? ?Socioeconomic History  ? Marital status: Married  ?  Spouse name: Not on file  ? Number of children: Not on file  ? Years of education: Not on file  ? Highest education level: Not on file  ?Occupational History  ? Occupation: retired  ?  Comment: Geophysicist/field seismologist  ? Occupation: caregiver  ?Tobacco Use  ? Smoking status: Former  ?  Packs/day: 1.00  ?  Years: 10.00  ?  Pack years: 10.00  ?  Types: Cigarettes  ?  Quit date: 05/25/1987  ?  Years since quitting: 34.1  ? Smokeless tobacco: Never  ?Vaping  Use  ? Vaping Use: Never used  ?Substance and Sexual Activity  ? Alcohol use: Yes  ?  Alcohol/week: 21.0 standard drinks  ?  Types: 21 Glasses of wine per week  ?  Comment: 3 glasses of wine daily, she stopped in 08/2020  ? Drug use: No  ? Sexual activity: Not Currently  ?  Birth control/protection: Post-menopausal  ?Other Topics Concern  ? Not on file  ?Social History Narrative  ? Not on file  ? ?Social Determinants of Health  ? ?Financial Resource Strain: Low Risk   ? Difficulty of Paying Living Expenses: Not hard at all  ?Food Insecurity: No Food Insecurity  ? Worried About Charity fundraiser in the Last Year: Never true  ? Ran Out of Food in the Last Year: Never true  ?Transportation Needs: No Transportation Needs  ? Lack of Transportation (Medical): No  ? Lack of Transportation (Non-Medical): No  ?  Physical Activity: Insufficiently Active  ? Days of Exercise per Week: 2 days  ? Minutes of Exercise per Session: 50 min  ?Stress: No Stress Concern Present  ? Feeling of Stress : Only a little  ?Social Connections: Socially Integrated  ? Frequency of Communication with Friends and Family: More than three times a week  ? Frequency of Social Gatherings with Friends and Family: More than three times a week  ? Attends Religious Services: 1 to 4 times per year  ? Active Member of Clubs or Organizations: Yes  ? Attends Archivist Meetings: 1 to 4 times per year  ? Marital Status: Married  ?Intimate Partner Violence: Not At Risk  ? Fear of Current or Ex-Partner: No  ? Emotionally Abused: No  ? Physically Abused: No  ? Sexually Abused: No  ? ?Breast Cancer-relatedfamily history is not on file. ? ? ? ?Vitals ?BP 118/73 (BP Location: Right Arm)   Pulse 86   Temp 98 ?F (36.7 ?C)   Resp 18   Ht '5\' 3"'$  (1.6 m)   Wt 67 kg   SpO2 96%   BMI 26.17 kg/m?  ? ? ?Physical Exam ?Constitutional:  lying in the bed and upset that her pain is not well controlled  ?   Comments:  ? ?Cardiovascular:  ?   Rate and Rhythm: Normal  rate and regular rhythm.  ?   Heart sounds: ? ?Pulmonary:  ?   Effort: Pulmonary effort is normal.  ?   Comments: Bilateral equal air entry  ? ?Abdominal:  ?   Palpations: Abdomen is soft.  ?   Tenderness: mild ten

## 2021-07-21 NOTE — Progress Notes (Signed)
PHARMACY - PHYSICIAN COMMUNICATION ?CRITICAL VALUE ALERT - BLOOD CULTURE IDENTIFICATION (BCID) ? ?[name] is an 76 y.o. female who presented to [facility] on 07/18/2021 with a chief complaint of sepsis secondary to UTI, acute cholecystitis, and E coli bacteremia.  ? ?Assessment: The patient previously had a BCID alert for E coli in 1/4 bottles, now with Morganella morganii in 3/4 bottles.  ? ?Name of physician (or Provider) Contacted: Dr. Karleen Hampshire ? ?Current antibiotics: Ceftriaxone IV 2g Q24H and metronidazole IV Q12H ? ?Changes to prescribed antibiotics recommended: Switch ceftriaxone IV 2g Q24H to cefepime IV 2g Q12H - recommendations accepted by provider ? ?Results for orders placed or performed during the hospital encounter of 07/18/21  ?Blood Culture ID Panel (Reflexed) (Collected: 07/18/2021  3:07 PM)  ?Result Value Ref Range  ? Enterococcus faecalis NOT DETECTED NOT DETECTED  ? Enterococcus Faecium NOT DETECTED NOT DETECTED  ? Listeria monocytogenes NOT DETECTED NOT DETECTED  ? Staphylococcus species NOT DETECTED NOT DETECTED  ? Staphylococcus aureus (BCID) NOT DETECTED NOT DETECTED  ? Staphylococcus epidermidis NOT DETECTED NOT DETECTED  ? Staphylococcus lugdunensis NOT DETECTED NOT DETECTED  ? Streptococcus species NOT DETECTED NOT DETECTED  ? Streptococcus agalactiae NOT DETECTED NOT DETECTED  ? Streptococcus pneumoniae NOT DETECTED NOT DETECTED  ? Streptococcus pyogenes NOT DETECTED NOT DETECTED  ? A.calcoaceticus-baumannii NOT DETECTED NOT DETECTED  ? Bacteroides fragilis NOT DETECTED NOT DETECTED  ? Enterobacterales DETECTED (A) NOT DETECTED  ? Enterobacter cloacae complex NOT DETECTED NOT DETECTED  ? Escherichia coli DETECTED (A) NOT DETECTED  ? Klebsiella aerogenes NOT DETECTED NOT DETECTED  ? Klebsiella oxytoca NOT DETECTED NOT DETECTED  ? Klebsiella pneumoniae NOT DETECTED NOT DETECTED  ? Proteus species NOT DETECTED NOT DETECTED  ? Salmonella species NOT DETECTED NOT DETECTED  ? Serratia  marcescens NOT DETECTED NOT DETECTED  ? Haemophilus influenzae NOT DETECTED NOT DETECTED  ? Neisseria meningitidis NOT DETECTED NOT DETECTED  ? Pseudomonas aeruginosa NOT DETECTED NOT DETECTED  ? Stenotrophomonas maltophilia NOT DETECTED NOT DETECTED  ? Candida albicans NOT DETECTED NOT DETECTED  ? Candida auris NOT DETECTED NOT DETECTED  ? Candida glabrata NOT DETECTED NOT DETECTED  ? Candida krusei NOT DETECTED NOT DETECTED  ? Candida parapsilosis NOT DETECTED NOT DETECTED  ? Candida tropicalis NOT DETECTED NOT DETECTED  ? Cryptococcus neoformans/gattii NOT DETECTED NOT DETECTED  ? CTX-M ESBL NOT DETECTED NOT DETECTED  ? Carbapenem resistance IMP NOT DETECTED NOT DETECTED  ? Carbapenem resistance KPC NOT DETECTED NOT DETECTED  ? Carbapenem resistance NDM NOT DETECTED NOT DETECTED  ? Carbapenem resist OXA 48 LIKE NOT DETECTED NOT DETECTED  ? Carbapenem resistance VIM NOT DETECTED NOT DETECTED  ? ? ?Shauna Hugh, PharmD, RPh  ?PGY-2 Pharmacy Resident ?07/21/2021 1:09 PM ? ?Please check [email or web address] for unit-specific pharmacy phone numbers. ? ? ?

## 2021-07-21 NOTE — Progress Notes (Addendum)
Patient ID: [name], female   DOB: 12/14/1945, 76 y.o.   MRN: [id number] ?[facility] ?Progress Note ? ?   ?Subjective: ?CC-  ?Overall feels her pain is improving - having some soreness around drain that improves with dilaudid. Denies nausea/vomiting. Decreased appetite but this is baseline for her. Voiding w/ purewick. +flatus, No BM this admission - at baseline reports daily BMs in AM.  ? ?Objective: ?Vital signs in last 24 hours: ?Temp:  [97.8 ?F (36.6 ?C)-98.1 ?F (36.7 ?C)] 97.8 ?F (36.6 ?C) (05/06 0541) ?Pulse Rate:  [89-106] 89 (05/06 0541) ?Resp:  [16-18] 18 (05/06 0541) ?BP: (102-121)/(62-71) 114/71 (05/06 0541) ?SpO2:  [95 %-99 %] 98 % (05/06 0541) ?Weight:  [67 kg] 67 kg (05/06 0500) ?Last BM Date : 07/17/21 ? ?Intake/Output from previous day: ?05/05 0701 - 05/06 0700 ?In: 0  ?Out: 50 [Drains:50] ?Intake/Output this shift: ?No intake/output data recorded. ? ?PE: ?Gen:  Alert, NAD, pleasant ?Abd: soft, TTP RUQ, drain with scant SS and purulent fluid (50cc/24h) ? ?Lab Results:  ?Recent Labs  ?  07/20/21 ?0158 07/21/21 ?7001  ?WBC 11.4* 11.7*  ?HGB 10.3* 10.2*  ?HCT 31.8* 31.4*  ?PLT 170 188  ? ?BMET ?Recent Labs  ?  07/20/21 ?0158 07/21/21 ?0233  ?NA 132* 131*  ?K 3.9 3.4*  ?CL 103 105  ?CO2 18* 17*  ?GLUCOSE 123* 140*  ?BUN 13 12  ?CREATININE 0.89 0.70  ?CALCIUM 7.9* 7.8*  ? ?PT/INR ?Recent Labs  ?  07/18/21 ?1503 07/19/21 ?7494  ?LABPROT 15.3* 16.3*  ?INR 1.2 1.3*  ? ?CMP  ?   ?Component Value Date/Time  ? NA 131 (L) 07/21/2021 0233  ? NA 139 02/02/2019 1314  ? K 3.4 (L) 07/21/2021 0233  ? CL 105 07/21/2021 0233  ? CO2 17 (L) 07/21/2021 0233  ? GLUCOSE 140 (H) 07/21/2021 0233  ? BUN 12 07/21/2021 0233  ? BUN 14 02/02/2019 1314  ? CREATININE 0.70 07/21/2021 0233  ? CREATININE 0.67 07/09/2021 1110  ? CREATININE 0.84 11/08/2019 0827  ? CALCIUM 7.8 (L) 07/21/2021 0233  ? PROT 4.8 (L) 07/21/2021 0233  ? PROT 6.7 02/02/2019 1314  ? ALBUMIN 1.8 (L) 07/21/2021 0233  ? ALBUMIN 4.5 02/02/2019  1314  ? AST 12 (L) 07/21/2021 0233  ? AST 22 07/09/2021 1110  ? ALT 12 07/21/2021 0233  ? ALT 52 (H) 07/09/2021 1110  ? ALKPHOS 73 07/21/2021 0233  ? BILITOT 0.7 07/21/2021 0233  ? BILITOT 0.6 07/09/2021 1110  ? GFRNONAA >60 07/21/2021 0233  ? GFRNONAA >60 07/09/2021 1110  ? GFRAA 71 02/02/2019 1314  ? ?Lipase  ?   ?Component Value Date/Time  ? LIPASE 33 06/30/2021 0939  ? ? ? ? ? ?Studies/Results: ?NM Hepatobiliary Liver Func ? ?Result Date: 07/19/2021 ?CLINICAL DATA:  Abdomen pain sepsis EXAM: NUCLEAR MEDICINE HEPATOBILIARY IMAGING TECHNIQUE: Sequential images of the abdomen were obtained out to 60 minutes following intravenous administration of radiopharmaceutical. RADIOPHARMACEUTICALS:  5.15 mCi Tc-34m Choletec IV COMPARISON:  CT 07/18/2021, 05/08/2021 FINDINGS: The gallbladder is non visualized after 1 hour of imaging. Small foci of activity at the inferior right hepatic margin are seen but do not persist on delayed views and are not static or progressive in appearance; this could represent contamination. Morphine augmentation performed with 2.7 mg of morphine. The gallbladder is still not visualized despite morphine augmentation though there is relative washout of activity from the liver. Bowel activity is present. There is no extravasation. Tubular activity in the left upper  quadrant likely reflects gastric reflux. IMPRESSION: 1. Nonvisualized gallbladder despite morphine augmentation, raising concern for an acute cholecystitis. No persistent accumulation of activity at the gallbladder fossa to suggests focal leak or biloma. The few non persistent foci of activity are seen at the inferior right hepatic margin and may represent artifact or contamination. Electronically Signed   By: Donavan Foil M.D.   On: 07/19/2021 17:04  ? ?IR Perc Cholecystostomy ? ?Result Date: 07/20/2021 ?INDICATION: Acute cholecystitis EXAM: ULTRASOUND FLUOROSCOPIC TRANSHEPATIC CHOLECYSTOSTOMY MEDICATIONS: Patient is already receiving IV  antibiotics as an inpatient; The antibiotic was administered within an appropriate time frame prior to the initiation of the procedure. ANESTHESIA/SEDATION: Moderate (conscious) sedation was employed during this procedure. A total of Versed 1.0 mg and Fentanyl 25 mcg was administered intravenously by the radiology nurse. Total intra-service moderate Sedation Time: 18 minutes. The patient's level of consciousness and vital signs were monitored continuously by radiology nursing throughout the procedure under my direct supervision. FLUOROSCOPY: Radiation Exposure Index (as provided by the fluoroscopic device): 5.4 mGy Kerma COMPLICATIONS: None immediate. PROCEDURE: Informed written consent was obtained from the patient after a thorough discussion of the procedural risks, benefits and alternatives. All questions were addressed. Maximal Sterile Barrier Technique was utilized including caps, mask, sterile gowns, sterile gloves, sterile drape, hand hygiene and skin antiseptic. A timeout was performed prior to the initiation of the procedure. Previous imaging reviewed. Preliminary ultrasound performed. A right upper quadrant subcostal transhepatic window was localized. This was correlated with the CT. Under sterile conditions and local anesthesia, an 18 gauge 15 cm access was advanced percutaneously through a transhepatic window into the gallbladder. Needle position confirmed with ultrasound. Images obtained for documentation. Guidewire inserted followed by tract dilatation to insert a 10 [place] drain. Drain catheter position confirmed with ultrasound and fluoroscopy. Images obtained for final documentation. Catheter secured with a Prolene suture. Gallbladder decompressed by syringe aspiration. Sample sent for culture. Gravity drainage bag connected. IMPRESSION: Successful CT-guided right upper quadrant transhepatic cholecystostomy Electronically Signed   By: Jerilynn Mages.  Shick M.D.   On: 07/20/2021 12:51    ? ?Anti-infectives: ?Anti-infectives (From admission, onward)  ? ? Start     Dose/Rate Route Frequency Ordered Stop  ? 07/19/21 1700  vancomycin (VANCOREADY) IVPB 1250 mg/250 mL  Status:  Discontinued       ? 1,250 mg ?166.7 mL/hr over 90 Minutes Intravenous Every 24 hours 07/18/21 1543 07/18/21 1853  ? 07/19/21 1700  vancomycin (VANCOCIN) IVPB 1000 mg/200 mL premix  Status:  Discontinued       ? 1,000 mg ?200 mL/hr over 60 Minutes Intravenous Every 24 hours 07/18/21 1853 07/18/21 1946  ? 07/19/21 1315  metroNIDAZOLE (FLAGYL) IVPB 500 mg       ? 500 mg ?100 mL/hr over 60 Minutes Intravenous Every 12 hours 07/19/21 1313    ? 07/19/21 1000  cefTRIAXone (ROCEPHIN) 2 g in sodium chloride 0.9 % 100 mL IVPB       ? 2 g ?200 mL/hr over 30 Minutes Intravenous Every 24 hours 07/19/21 0616    ? 07/19/21 0200  metroNIDAZOLE (FLAGYL) IVPB 500 mg  Status:  Discontinued       ? 500 mg ?100 mL/hr over 60 Minutes Intravenous Every 12 hours 07/18/21 1945 07/19/21 0616  ? 07/19/21 0000  ceFEPIme (MAXIPIME) 2 g in sodium chloride 0.9 % 100 mL IVPB  Status:  Discontinued       ? 2 g ?200 mL/hr over 30 Minutes Intravenous Every 12 hours 07/18/21 1607  07/19/21 0616  ? 07/18/21 1700  vancomycin (VANCOREADY) IVPB 1500 mg/300 mL       ? 1,500 mg ?150 mL/hr over 120 Minutes Intravenous  Once 07/18/21 1543 07/18/21 1848  ? 07/18/21 1545  ceFEPIme (MAXIPIME) 2 g in sodium chloride 0.9 % 100 mL IVPB  Status:  Discontinued       ? 2 g ?200 mL/hr over 30 Minutes Intravenous Every 12 hours 07/18/21 1543 07/18/21 1607  ? 07/18/21 1530  aztreonam (AZACTAM) 2 g in sodium chloride 0.9 % 100 mL IVPB  Status:  Discontinued       ? 2 g ?200 mL/hr over 30 Minutes Intravenous  Once 07/18/21 1527 07/18/21 1534  ? 07/18/21 1530  metroNIDAZOLE (FLAGYL) IVPB 500 mg       ? 500 mg ?100 mL/hr over 60 Minutes Intravenous  Once 07/18/21 1527 07/18/21 1652  ? 07/18/21 1530  vancomycin (VANCOCIN) IVPB 1000 mg/200 mL premix  Status:  Discontinued       ? 1,000  mg ?200 mL/hr over 60 Minutes Intravenous  Once 07/18/21 1527 07/18/21 1540  ? ?  ? ? ? 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 Benay Spice) who presented with fever and possible gallbl

## 2021-07-22 ENCOUNTER — Inpatient Hospital Stay (HOSPITAL_COMMUNITY): Payer: Medicare HMO

## 2021-07-22 DIAGNOSIS — E119 Type 2 diabetes mellitus without complications: Secondary | ICD-10-CM | POA: Diagnosis not present

## 2021-07-22 DIAGNOSIS — G9341 Metabolic encephalopathy: Secondary | ICD-10-CM | POA: Diagnosis not present

## 2021-07-22 DIAGNOSIS — K810 Acute cholecystitis: Secondary | ICD-10-CM | POA: Diagnosis not present

## 2021-07-22 DIAGNOSIS — E039 Hypothyroidism, unspecified: Secondary | ICD-10-CM | POA: Diagnosis not present

## 2021-07-22 LAB — CBC WITH DIFFERENTIAL/PLATELET
Abs Immature Granulocytes: 0.81 10*3/uL — ABNORMAL HIGH (ref 0.00–0.07)
Basophils Absolute: 0.1 10*3/uL (ref 0.0–0.1)
Basophils Relative: 0 %
Eosinophils Absolute: 0.2 10*3/uL (ref 0.0–0.5)
Eosinophils Relative: 1 %
HCT: 33.6 % — ABNORMAL LOW (ref 36.0–46.0)
Hemoglobin: 11.1 g/dL — ABNORMAL LOW (ref 12.0–15.0)
Immature Granulocytes: 4 %
Lymphocytes Relative: 11 %
Lymphs Abs: 2 10*3/uL (ref 0.7–4.0)
MCH: 30.4 pg (ref 26.0–34.0)
MCHC: 33 g/dL (ref 30.0–36.0)
MCV: 92.1 fL (ref 80.0–100.0)
Monocytes Absolute: 1.5 10*3/uL — ABNORMAL HIGH (ref 0.1–1.0)
Monocytes Relative: 8 %
Neutro Abs: 13.9 10*3/uL — ABNORMAL HIGH (ref 1.7–7.7)
Neutrophils Relative %: 76 %
Platelets: 245 10*3/uL (ref 150–400)
RBC: 3.65 MIL/uL — ABNORMAL LOW (ref 3.87–5.11)
RDW: 15.4 % (ref 11.5–15.5)
WBC: 18.5 10*3/uL — ABNORMAL HIGH (ref 4.0–10.5)
nRBC: 0 % (ref 0.0–0.2)

## 2021-07-22 LAB — COMPREHENSIVE METABOLIC PANEL
ALT: 14 U/L (ref 0–44)
AST: 20 U/L (ref 15–41)
Albumin: 1.7 g/dL — ABNORMAL LOW (ref 3.5–5.0)
Alkaline Phosphatase: 138 U/L — ABNORMAL HIGH (ref 38–126)
Anion gap: 8 (ref 5–15)
BUN: 11 mg/dL (ref 8–23)
CO2: 17 mmol/L — ABNORMAL LOW (ref 22–32)
Calcium: 8 mg/dL — ABNORMAL LOW (ref 8.9–10.3)
Chloride: 109 mmol/L (ref 98–111)
Creatinine, Ser: 0.63 mg/dL (ref 0.44–1.00)
GFR, Estimated: >60 mL/min (ref >60)
Glucose, Bld: 119 mg/dL — ABNORMAL HIGH (ref 70–99)
Potassium: 3.8 mmol/L (ref 3.5–5.1)
Sodium: 134 mmol/L — ABNORMAL LOW (ref 135–145)
Total Bilirubin: 0.7 mg/dL (ref 0.3–1.2)
Total Protein: 4.8 g/dL — ABNORMAL LOW (ref 6.5–8.1)

## 2021-07-22 LAB — GLUCOSE, CAPILLARY
Glucose-Capillary: 115 mg/dL — ABNORMAL HIGH (ref 70–99)
Glucose-Capillary: 119 mg/dL — ABNORMAL HIGH (ref 70–99)
Glucose-Capillary: 128 mg/dL — ABNORMAL HIGH (ref 70–99)
Glucose-Capillary: 137 mg/dL — ABNORMAL HIGH (ref 70–99)

## 2021-07-22 MED ORDER — HYDROMORPHONE HCL 1 MG/ML IJ SOLN
0.5000 mg | INTRAMUSCULAR | Status: DC | PRN
  Administered 2021-07-22 – 2021-08-01 (×14): 0.5 mg via INTRAVENOUS
  Filled 2021-07-22 (×14): qty 0.5

## 2021-07-22 NOTE — Progress Notes (Signed)
? ? ? [facility] ?                                                                            ? ? ?[name], is a 76 y.o. female, DOB - 1945/06/12, KGY:[id number] ?Admit date - 07/18/2021    ?Outpatient Primary MD for the patient is [name], MD ? ?LOS - 4  days ? ? ? ?Brief summary  ? ?[name] is a 77 y.o. female with medical history significant for pancreatic cancer on chemotherapy, GERD, type 2 diabetes mellitus, acquired hypothyroidism, GERD, who is admitted to [facility] on 07/18/2021 with acute cholecystitis after presenting from home to [facility] ED for evaluation of altered mental status.  ? ?Assessment & Plan  ? ? ?Assessment and Plan: ?Severe sepsis from UTI, Acute cholecystitis and E coli ad Morganella bacteremia:  ?On admission pt was febrile, tachycardic >90/ min, lactic acid >2, wbc count <4000 , altered mental status.  Blood cultures positive for E coli and Morganella.  ?Pt presented with abdominal pain, nausea, decreased oral intake, and CT abd and pelvis showing GB wall thickening with associated GB edema, possible GB perforation on CT. Marland Kitchen  ?General surgery consulted, unfortunately she is not a surgical candidate. HIDA scan significant for acute cholecystitis. IR consulted and she underwent cholecystostomy.  ?She was empirically started on IV antibiotics transitioned to IV cefepime. Requested ID consult for the antibiotics and duration.  ?Blood cultures showing Morganella and E coli.  ?Meanwhile continue with IV fluids, and anti emetics.  ?Started the patient on clear liquid diet, advanced as tolerated. She is on soft diet tolerated withou any nausea. ? ?Diet-controlled diabetes mellitus ([place]) ?Most recent hemoglobin A1c noted to be 6.3% on 07/10/2021.   ?CBG (last 3)  ?Recent Labs  ?  07/22/21 ?0025 07/22/21 ?0263 07/22/21 ?1123  ?GLUCAP 128* 119* 137*  ? ? ?Resume SSI. No changes in meds.  ? ? ?Acquired hypothyroidism ?Started the patient on synthroid.  ? ?GAD  (generalized anxiety disorder) ?Prn ativan ordered.  ? ?Hyponatremia ?Sub-acute hypo-osmolar hyponatremia: Suspect an element of hypovolumeia,.  Differential also includes the possibility of a contribution from SIADH. ?TSH wnl.  ?Much improved to 134.  ? ? ?Acute metabolic encephalopathy ?Appears to have resolved. She is alert and oriented.  ?Possibly secondary to UTI, acute Cholecystitis.  vs from hyponatremia vs from dehydration.  ? ?Hypomagnesemia:  ?Replaced. Repeat level wnl.  ? ? ?Pancytopenia:  ?Mild anemia, thrombocytopenia and wbc count of 3.8. probably from chemotherapy.  ?Improved . ? ?Urinary tract infection from E COLI and enterococcus:  ?On appropriate antibiotics and fosfomycin for the enterococcus.  ? ?Adenocarcinoma of the Pancreas, stage 4, on chemotherapy  ?Follows up with Dr Benay Spice. S/p chemo last week.  ?  ?Hypokalemia:  ?Replaced.  ? ? ?Constipation:  ?Will get abdominal films , as she reports no BM even with senna, colace and miralax.  ? ?RN Pressure Injury Documentation: ?Pressure Injury 06/30/21 Thigh Posterior;Proximal;Right Stage 1 -  Intact skin with non-blanchable redness of a localized area usually over a bony prominence. Intact darkened skin right gluteal fold (Active)  ?06/30/21 2000  ?Location: Thigh  ?Location Orientation: Posterior;Proximal;Right  ?Staging: Stage  1 -  Intact skin with non-blanchable redness of a localized area usually over a bony prominence.  ?Wound Description (Comments): Intact darkened skin right gluteal fold  ?Present on Admission: Yes  ?Dressing Type None 07/20/21 2100  ?Local wound care. ? ?Hypertension:  ?BP parameters are well controlled.  ? ? ?COVID -19 infection on 06/20/2021: ?Completed course of antibiotics.  ? ?Estimated body mass index is 26.17 kg/m? as calculated from the following: ?  Height as of this encounter: '5\' 3"'$  (1.6 m). ?  Weight as of this encounter: 67 kg. ? ?Code Status: full code.  ?DVT Prophylaxis:  heparin injection 5,000 Units Start:  07/18/21 2330 ?SCDs Start: 07/18/21 1925 ? ? ?Level of Care: Level of care: Telemetry Medical ?Family Communication: family at bedside.  ? ?Disposition Plan:     Remains inpatient appropriate:IV antibiotics.  ? ?Procedures:  ?HIDA SCAN  ?IR cholecystostomy.  ? ?Consultants:   ?General surgery ?IR.  ?Palliative care ? ? ?Antimicrobials:  ? ?Anti-infectives (From admission, onward)  ? ? Start     Dose/Rate Route Frequency Ordered Stop  ? 07/21/21 1430  fosfomycin (MONUROL) packet 3 g       ? 3 g Oral  Once 07/21/21 1330 07/21/21 1512  ? 07/21/21 1430  ceFEPIme (MAXIPIME) 2 g in sodium chloride 0.9 % 100 mL IVPB       ? 2 g ?200 mL/hr over 30 Minutes Intravenous Every 12 hours 07/21/21 1330    ? 07/19/21 1700  vancomycin (VANCOREADY) IVPB 1250 mg/250 mL  Status:  Discontinued       ? 1,250 mg ?166.7 mL/hr over 90 Minutes Intravenous Every 24 hours 07/18/21 1543 07/18/21 1853  ? 07/19/21 1700  vancomycin (VANCOCIN) IVPB 1000 mg/200 mL premix  Status:  Discontinued       ? 1,000 mg ?200 mL/hr over 60 Minutes Intravenous Every 24 hours 07/18/21 1853 07/18/21 1946  ? 07/19/21 1315  metroNIDAZOLE (FLAGYL) IVPB 500 mg       ? 500 mg ?100 mL/hr over 60 Minutes Intravenous Every 12 hours 07/19/21 1313    ? 07/19/21 1000  cefTRIAXone (ROCEPHIN) 2 g in sodium chloride 0.9 % 100 mL IVPB  Status:  Discontinued       ? 2 g ?200 mL/hr over 30 Minutes Intravenous Every 24 hours 07/19/21 0616 07/21/21 1330  ? 07/19/21 0200  metroNIDAZOLE (FLAGYL) IVPB 500 mg  Status:  Discontinued       ? 500 mg ?100 mL/hr over 60 Minutes Intravenous Every 12 hours 07/18/21 1945 07/19/21 0616  ? 07/19/21 0000  ceFEPIme (MAXIPIME) 2 g in sodium chloride 0.9 % 100 mL IVPB  Status:  Discontinued       ? 2 g ?200 mL/hr over 30 Minutes Intravenous Every 12 hours 07/18/21 1607 07/19/21 0616  ? 07/18/21 1700  vancomycin (VANCOREADY) IVPB 1500 mg/300 mL       ? 1,500 mg ?150 mL/hr over 120 Minutes Intravenous  Once 07/18/21 1543 07/18/21 1848  ? 07/18/21 1545   ceFEPIme (MAXIPIME) 2 g in sodium chloride 0.9 % 100 mL IVPB  Status:  Discontinued       ? 2 g ?200 mL/hr over 30 Minutes Intravenous Every 12 hours 07/18/21 1543 07/18/21 1607  ? 07/18/21 1530  aztreonam (AZACTAM) 2 g in sodium chloride 0.9 % 100 mL IVPB  Status:  Discontinued       ? 2 g ?200 mL/hr over 30 Minutes Intravenous  Once 07/18/21 1527 07/18/21 1534  ?  07/18/21 1530  metroNIDAZOLE (FLAGYL) IVPB 500 mg       ? 500 mg ?100 mL/hr over 60 Minutes Intravenous  Once 07/18/21 1527 07/18/21 1652  ? 07/18/21 1530  vancomycin (VANCOCIN) IVPB 1000 mg/200 mL premix  Status:  Discontinued       ? 1,000 mg ?200 mL/hr over 60 Minutes Intravenous  Once 07/18/21 1527 07/18/21 1540  ? ?  ? ? ? ?Medications ? ?Scheduled Meds: ? feeding supplement  1 Container Oral TID BM  ? heparin injection (subcutaneous)  5,000 Units Subcutaneous Q8H  ? insulin aspart  0-6 Units Subcutaneous Q6H  ? levothyroxine  125 mcg Oral QAC breakfast  ? polyethylene glycol  17 g Oral Daily  ? senna-docusate  2 tablet Oral BID  ? ?Continuous Infusions: ? sodium chloride 75 mL/hr at 07/22/21 0500  ? ceFEPime (MAXIPIME) IV 2 g (07/22/21 0831)  ? metronidazole 500 mg (07/22/21 1323)  ? ?PRN Meds:.acetaminophen **OR** acetaminophen, ALPRAZolam, HYDROmorphone (DILAUDID) injection, iohexol, naLOXone (NARCAN)  injection, ondansetron (ZOFRAN) IV, traMADol ? ? ? ?Subjective:  ? ?Lerin Jech was seen and examined today. Some back pain from being on the bed, recommended to get out of bed and ordered therapy evaluations.  ?She is refusing to be on oxycodone or vicodin, reports tramadol is not working, wants to take dilaudid only.  ? ?Objective:  ? ?Vitals:  ? 07/21/21 1520 07/21/21 2003 07/22/21 0329 07/22/21 0902  ?BP: 126/76 127/70 135/75 132/78  ?Pulse: (!) 107 96 97 92  ?Resp: '17 19 17 18  '$ ?Temp: 98.2 ?F (36.8 ?C) 98.5 ?F (36.9 ?C) 98.3 ?F (36.8 ?C) 98.1 ?F (36.7 ?C)  ?TempSrc:  Oral Oral Oral  ?SpO2: 94% 96% 96% 95%  ?Weight:      ?Height:       ? ? ?Intake/Output Summary (Last 24 hours) at 07/22/2021 1332 ?Last data filed at 07/22/2021 0500 ?Gross per 24 hour  ?Intake 2877.98 ml  ?Output 400 ml  ?Net 2477.98 ml  ? ? ?Filed Weights  ? 07/18/21 1511 07/21/21

## 2021-07-22 NOTE — Progress Notes (Signed)
? ?                                                                                                                                                     ?                                                   ?Daily Progress Note  ? ?Patient Name: [name]       Date: 07/22/2021 ?DOB: 12-24-1945  Age: 76 y.o. MRN#: [id number] ?Attending Physician: Hosie Poisson, MD ?Primary Care Physician: [name], MD ?Admit Date: 07/18/2021 ? ?Reason for Consultation/Follow-up: Establishing goals of care ? ?Subjective: ?Medical records reviewed. Patient assessed at the bedside. Discussed with RN. Patient reports mild pain at cholecystostomy side. She wants to get up as instructed, tells me this will be her first attempt. Offered to assist with transfer to bedside chair. Patient prefers to wait for PT and "do everything at once." Offered to continue goals of care conversation and discuss her current illness. Patient is tired and would prefer this PA to return tomorrow for another attempt. ? ?Questions and concerns addressed. PMT will continue to support holistically.  ? ?Length of Stay: 4 ? ?Current Medications: ?Scheduled Meds:  ? feeding supplement  1 Container Oral TID BM  ? heparin injection (subcutaneous)  5,000 Units Subcutaneous Q8H  ? insulin aspart  0-6 Units Subcutaneous Q6H  ? levothyroxine  125 mcg Oral QAC breakfast  ? polyethylene glycol  17 g Oral Daily  ? senna-docusate  2 tablet Oral BID  ? ? ?Continuous Infusions: ? sodium chloride 75 mL/hr at 07/22/21 0500  ? ceFEPime (MAXIPIME) IV 2 g (07/22/21 0831)  ? metronidazole 500 mg (07/22/21 0029)  ? ? ?PRN Meds: ?acetaminophen **OR** acetaminophen, ALPRAZolam, HYDROmorphone (DILAUDID) injection, iohexol, naLOXone (NARCAN)  injection, ondansetron (ZOFRAN) IV, traMADol ? ?Physical Exam ?Vitals and nursing note reviewed.  ?Constitutional:   ?   General: She is not in acute distress. ?   Appearance: She is ill-appearing.  ?Cardiovascular:  ?   Rate and Rhythm: Normal  rate.  ?Pulmonary:  ?   Effort: Pulmonary effort is normal.  ?Skin: ?   General: Skin is warm and dry.  ?Neurological:  ?   Mental Status: She is alert and oriented to person, place, and time.  ?Psychiatric:     ?   Mood and Affect: Mood normal.  ?         ? ?Vital Signs: BP 132/78 (BP Location: Right Arm)   Pulse 92   Temp 98.1 ?F (36.7 ?C) (Oral)   Resp 18   Ht '5\' 3"'$  (1.6 m)   Wt 67 kg  SpO2 95%   BMI 26.17 kg/m?  ?SpO2: SpO2: 95 % ?O2 Device: O2 Device: Room Air ?O2 Flow Rate: O2 Flow Rate (L/min): 2 L/min ? ?Intake/output summary:  ?Intake/Output Summary (Last 24 hours) at 07/22/2021 1224 ?Last data filed at 07/22/2021 0500 ?Gross per 24 hour  ?Intake 2877.98 ml  ?Output 400 ml  ?Net 2477.98 ml  ? ?LBM: Last BM Date : 07/17/21 ?Baseline Weight: Weight: 67.1 kg ?Most recent weight: Weight: 67 kg ? ?     ?Palliative Assessment/Data: ? ? ? ? ? ?Patient Active Problem List  ? Diagnosis Date Noted  ? Cholecystitis 07/19/2021  ? Acute cholecystitis 07/18/2021  ? Acute metabolic encephalopathy 44/92/0100  ? Hyponatremia 07/18/2021  ? Elevated liver enzymes 06/30/2021  ? Pancreatic adenocarcinoma ([place]) 09/25/2020  ? Hyperbilirubinemia 09/24/2020  ? 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 Vitamin B12 deficiency 10/20/2019  ? GAD (generalized anxiety disorder) 10/20/2019  ? Diet-controlled diabetes mellitus ([place]) 10/20/2019  ? Osteoarthritis, multiple sites 10/20/2019  ? Presbycusis of both ears 04/13/2019  ? Essential tremor 04/13/2019  ? Bilateral primary osteoarthritis of knee 09/23/2017  ? Acquired hypothyroidism 05/24/2012  ? Alcohol abuse, daily use 05/24/2012  ? Essential hypertension   ? ? ?Palliative Care Assessment & Plan  ? ?Patient Profile: ?76 y.o. female  with past medical history of DM, TIA, HTN, alcohol abuse, pancreatic cancer currently (actively receiving chemotherapy) Recent COVID PNA  admitted on 07/18/2021 with AMS, fatigue, cough and anorexia. ?  ?In the ED, CT  could not exclude gallbladder rupture or perforation. HIDA scan with concern for acute cholecystitis and patient is s/p chlecystostomy today 5/5. PMT has been consulted to assist with goals of care conversation.  ? ?Assessment: ?76 y.o. female  with past medical history of DM, TIA, HTN, alcohol abuse, pancreatic cancer currently (actively receiving chemotherapy) Recent COVID PNA  admitted on 07/18/2021 with AMS, fatigue, cough and anorexia. ?  ?In the ED, CT could not exclude gallbladder rupture or perforation. HIDA scan with concern for acute cholecystitis and patient is s/p chlecystostomy today 5/5. PMT has been consulted to assist with goals of care conversation.  ? ?Recommendations/Plan: ?Continue full code/full scope treatment ?Patient defers [place] conversation today and is agreeable to another visit tomorrow. PMT will continue to follow ? ? ?Prognosis: ? Unable to determine ? ?Discharge Planning: ?To Be Determined ? ? ?Total time: ?I spent 25 minutes in the care of the patient today in the above activities and documenting the encounter. ? ? ?Dorthy Cooler, PA-C ?Palliative Medicine Team ?Team phone # [phone] ? ?Thank you for allowing the Palliative Medicine Team to assist in the care of this patient. Please utilize secure chat with additional questions, if there is no response within 30 minutes please call the above phone number. ? ?Palliative Medicine Team providers are available by phone from 7am to 7pm daily and can be reached through the team cell phone.  ?Should this patient require assistance outside of these hours, please call the patient's attending physician.  ? ? ? ?

## 2021-07-23 ENCOUNTER — Inpatient Hospital Stay (HOSPITAL_COMMUNITY): Payer: Medicare HMO

## 2021-07-23 ENCOUNTER — Inpatient Hospital Stay: Payer: Medicare HMO

## 2021-07-23 ENCOUNTER — Inpatient Hospital Stay: Payer: Medicare HMO | Admitting: Oncology

## 2021-07-23 DIAGNOSIS — E039 Hypothyroidism, unspecified: Secondary | ICD-10-CM | POA: Diagnosis not present

## 2021-07-23 DIAGNOSIS — K810 Acute cholecystitis: Secondary | ICD-10-CM | POA: Diagnosis not present

## 2021-07-23 DIAGNOSIS — A419 Sepsis, unspecified organism: Secondary | ICD-10-CM | POA: Diagnosis not present

## 2021-07-23 DIAGNOSIS — E119 Type 2 diabetes mellitus without complications: Secondary | ICD-10-CM | POA: Diagnosis not present

## 2021-07-23 DIAGNOSIS — Z7189 Other specified counseling: Secondary | ICD-10-CM | POA: Diagnosis not present

## 2021-07-23 DIAGNOSIS — G9341 Metabolic encephalopathy: Secondary | ICD-10-CM | POA: Diagnosis not present

## 2021-07-23 LAB — CULTURE, BLOOD (ROUTINE X 2): Special Requests: ADEQUATE

## 2021-07-23 LAB — BASIC METABOLIC PANEL
Anion gap: 10 (ref 5–15)
BUN: 12 mg/dL (ref 8–23)
CO2: 15 mmol/L — ABNORMAL LOW (ref 22–32)
Calcium: 7.7 mg/dL — ABNORMAL LOW (ref 8.9–10.3)
Chloride: 109 mmol/L (ref 98–111)
Creatinine, Ser: 0.65 mg/dL (ref 0.44–1.00)
GFR, Estimated: >60 mL/min (ref >60)
Glucose, Bld: 196 mg/dL — ABNORMAL HIGH (ref 70–99)
Potassium: 3.5 mmol/L (ref 3.5–5.1)
Sodium: 134 mmol/L — ABNORMAL LOW (ref 135–145)

## 2021-07-23 LAB — CBC WITH DIFFERENTIAL/PLATELET
Abs Immature Granulocytes: 2.95 10*3/uL — ABNORMAL HIGH (ref 0.00–0.07)
Basophils Absolute: 0 10*3/uL (ref 0.0–0.1)
Basophils Relative: 0 %
Eosinophils Absolute: 0.1 10*3/uL (ref 0.0–0.5)
Eosinophils Relative: 0 %
HCT: 35.7 % — ABNORMAL LOW (ref 36.0–46.0)
Hemoglobin: 11.9 g/dL — ABNORMAL LOW (ref 12.0–15.0)
Immature Granulocytes: 12 %
Lymphocytes Relative: 8 %
Lymphs Abs: 2.1 10*3/uL (ref 0.7–4.0)
MCH: 31.1 pg (ref 26.0–34.0)
MCHC: 33.3 g/dL (ref 30.0–36.0)
MCV: 93.2 fL (ref 80.0–100.0)
Monocytes Absolute: 1.5 10*3/uL — ABNORMAL HIGH (ref 0.1–1.0)
Monocytes Relative: 6 %
Neutro Abs: 18.1 10*3/uL — ABNORMAL HIGH (ref 1.7–7.7)
Neutrophils Relative %: 74 %
Platelets: 316 10*3/uL (ref 150–400)
RBC: 3.83 MIL/uL — ABNORMAL LOW (ref 3.87–5.11)
RDW: 15.8 % — ABNORMAL HIGH (ref 11.5–15.5)
WBC: 24.7 10*3/uL — ABNORMAL HIGH (ref 4.0–10.5)
nRBC: 0.1 % (ref 0.0–0.2)

## 2021-07-23 LAB — AEROBIC/ANAEROBIC CULTURE W GRAM STAIN (SURGICAL/DEEP WOUND)

## 2021-07-23 LAB — GLUCOSE, CAPILLARY
Glucose-Capillary: 138 mg/dL — ABNORMAL HIGH (ref 70–99)
Glucose-Capillary: 142 mg/dL — ABNORMAL HIGH (ref 70–99)
Glucose-Capillary: 156 mg/dL — ABNORMAL HIGH (ref 70–99)
Glucose-Capillary: 188 mg/dL — ABNORMAL HIGH (ref 70–99)

## 2021-07-23 MED ORDER — SODIUM CHLORIDE 0.9% FLUSH
10.0000 mL | Freq: Two times a day (BID) | INTRAVENOUS | Status: DC
  Administered 2021-07-23 – 2021-08-01 (×18): 10 mL

## 2021-07-23 MED ORDER — HYDRALAZINE HCL 25 MG PO TABS
25.0000 mg | ORAL_TABLET | Freq: Three times a day (TID) | ORAL | Status: DC | PRN
Start: 2021-07-23 — End: 2021-08-01

## 2021-07-23 MED ORDER — AMLODIPINE BESYLATE 5 MG PO TABS
5.0000 mg | ORAL_TABLET | Freq: Every day | ORAL | Status: DC
Start: 2021-07-23 — End: 2021-08-01
  Administered 2021-07-25 – 2021-08-01 (×6): 5 mg via ORAL
  Filled 2021-07-23 (×10): qty 1

## 2021-07-23 MED ORDER — METRONIDAZOLE 500 MG/100ML IV SOLN
500.0000 mg | Freq: Two times a day (BID) | INTRAVENOUS | Status: DC
  Administered 2021-07-23 – 2021-07-24 (×2): 500 mg via INTRAVENOUS
  Filled 2021-07-23 (×2): qty 100

## 2021-07-23 MED ORDER — SODIUM CHLORIDE 0.9% FLUSH
10.0000 mL | Freq: Two times a day (BID) | INTRAVENOUS | Status: DC
  Administered 2021-07-23 – 2021-08-01 (×16): 10 mL

## 2021-07-23 MED ORDER — PROCHLORPERAZINE MALEATE 5 MG PO TABS
5.0000 mg | ORAL_TABLET | Freq: Four times a day (QID) | ORAL | Status: DC | PRN
  Filled 2021-07-23: qty 1

## 2021-07-23 MED ORDER — ADULT MULTIVITAMIN W/MINERALS CH
1.0000 | ORAL_TABLET | Freq: Every day | ORAL | Status: DC
  Filled 2021-07-23 (×8): qty 1

## 2021-07-23 MED ORDER — PROCHLORPERAZINE EDISYLATE 10 MG/2ML IJ SOLN
5.0000 mg | Freq: Four times a day (QID) | INTRAMUSCULAR | Status: DC | PRN
  Administered 2021-07-23 – 2021-07-31 (×6): 5 mg via INTRAVENOUS
  Filled 2021-07-23 (×6): qty 2

## 2021-07-23 MED ORDER — SODIUM CHLORIDE 0.9% FLUSH
5.0000 mL | Freq: Three times a day (TID) | INTRAVENOUS | Status: DC
  Administered 2021-07-23 – 2021-08-01 (×27): 5 mL

## 2021-07-23 MED ORDER — SODIUM CHLORIDE 0.9% FLUSH
10.0000 mL | INTRAVENOUS | Status: DC | PRN
  Administered 2021-07-29: 10 mL

## 2021-07-23 MED ORDER — CHLORHEXIDINE GLUCONATE CLOTH 2 % EX PADS
6.0000 | MEDICATED_PAD | Freq: Every day | CUTANEOUS | Status: DC
  Administered 2021-07-23 – 2021-08-01 (×10): 6 via TOPICAL

## 2021-07-23 MED ORDER — LIDOCAINE-PRILOCAINE 2.5-2.5 % EX CREA
TOPICAL_CREAM | CUTANEOUS | Status: DC | PRN
  Filled 2021-07-23: qty 5

## 2021-07-23 NOTE — Progress Notes (Signed)
PT Cancellation Note ? ?Patient Details ?Name: [name] ?MRN: [id number] ?DOB: 02-06-1946 ? ? ?Cancelled Treatment:    Reason Eval/Treat Not Completed: Other (comment).  Pt was attempted and was with other staff members twice.  Follow up as time and pt allow. ? ? ?Ramond Dial ?07/23/2021, 12:01 PM ? ?Mee Hives, PT PhD ?Acute Rehab Dept. Number: [facility] [phone] and [place] [phone] ? ?

## 2021-07-23 NOTE — Evaluation (Signed)
Occupational Therapy Evaluation ?Patient Details ?Name: [name] ?MRN: [id number] ?DOB: [birth year] ?Today's Date: 07/23/2021 ? ? ?History of Present Illness 76 y.o. female s/p cholecystostomy by IR 5/5 with morganella morganii ecoli bacteremia PMH 06/30/21 COVID-19 infection diagnosed 06/20/21 with COVID PNA.  T2DM, HLD, HTN, hx of TIA, depression/anxiety, metastatic pancreatic cancer currently undergoing chemotherapy (last infusion 06/11/21)  ? ?Clinical Impression ?  ?Patient is s/p cholecystostomy surgery resulting in functional limitations due to the deficits listed below (see OT problem list). Pt currently undergoing chemo treatment so masking utilized entire session. Pt hallucinating during session and RN present for description.  Pt is aware of hallucinations are not real and concerned by the event. Recommend use of stedy at this time for basic transfers. Patient will benefit from skilled OT acutely to increase independence and safety with ADLS to allow discharge [place] pending progress. ?  ?   ? ?Recommendations for follow up therapy are one component of a multi-disciplinary discharge planning process, led by the attending physician.  Recommendations may be updated based on patient status, additional functional criteria and insurance authorization.  ? ?Follow Up Recommendations ? Home health OT  ?  ?Assistance Recommended at Discharge Set up Supervision/Assistance  ?Patient can return home with the following A lot of help with walking and/or transfers;A lot of help with bathing/dressing/bathroom;Assist for transportation ? ?  ?Functional Status Assessment ? Patient has had a recent decline in their functional status and demonstrates the ability to make significant improvements in function in a reasonable and predictable amount of time.  ?Equipment Recommendations ? Wheelchair (measurements OT);Wheelchair cushion (measurements OT);Hospital bed;BSC/3in1 (has DME at home)  ?  ?Recommendations for Other Services  Other (comment) (palliative team) ? ? ?  ?Precautions / Restrictions Precautions ?Precautions: Fall ?Precaution Comments: R flank Drain  ? ?  ? ?Mobility Bed Mobility ?Overal bed mobility: Needs Assistance ?Bed Mobility: Supine to Sit ?  ?  ?Supine to sit: Mod assist ?  ?  ?General bed mobility comments: pt requires 1 step sequence to progress to eob. pt with (A) to elevated trunk from surface./ static sitting min guard ?  ? ?Transfers ?  ?  ?  ?  ?  ?  ?  ?  ?  ?  ?  ? ?  ?Balance Overall balance assessment: Mild deficits observed, not formally tested ?  ?  ?  ?  ?  ?  ?  ?  ?  ?  ?  ?  ?  ?  ?  ?  ?  ?  ?   ? ?ADL either performed or assessed with clinical judgement  ? ?ADL Overall ADL's : Needs assistance/impaired ?Eating/Feeding: Set up;Sitting ?Eating/Feeding Details (indicate cue type and reason): pt reports sustain holding of water is too heavy with taking medication and needs to sit it down ?Grooming: Bed level;Set up ?  ?Upper Body Bathing: Minimal assistance;Bed level ?  ?Lower Body Bathing: Moderate assistance;Sit to/from stand ?  ?Upper Body Dressing : Minimal assistance ?  ?Lower Body Dressing: Moderate assistance ?  ?Toilet Transfer: Minimal assistance (elevated surface and use of stedy) ?  ?  ?  ?  ?  ?  ?General ADL Comments: pt self reports I am took weak to walk. pt in stedy with elevated surface and able to power up . pt with some dizziness with stable BP 165/96. Rn present. Pt progressed from bed to chair and reports increased comfort at this time. pt states that bed has a  hole in it  ? ? ? ?Vision Baseline Vision/History: 1 Wears glasses ?   ?   ?Perception   ?  ?Praxis   ?  ? ?Pertinent Vitals/Pain Pain Assessment ?Pain Assessment: No/denies pain  ? ? ? ?Hand Dominance Right ?  ?Extremity/Trunk Assessment Upper Extremity Assessment ?Upper Extremity Assessment: Generalized weakness ?  ?Lower Extremity Assessment ?Lower Extremity Assessment: Generalized weakness ?  ?Cervical / Trunk  Assessment ?Cervical / Trunk Assessment: Kyphotic ?  ?Communication Communication ?Communication: No difficulties ?  ?Cognition Arousal/Alertness: Awake/alert ?Behavior During Therapy: Flat affect ?Overall Cognitive Status: Impaired/Different from baseline ?  ?  ?  ?  ?  ?  ?  ?  ?  ?  ?  ?  ?  ?  ?  ?  ?General Comments: pt self reports seeing things she knows is not there. pt states "it looks like outside" when pt was told to point to where she sees the image she was pointed to L peripheral vision. pt told to turn and look to the left with report that the images are gone now. Pt states she knows she is inside but she keeps seeing outside ?  ?  ?General Comments  reports dizziness and fatigue with basic transfer. pt progressed to chair level. pt anxious at times during session. spouse present and talking on the phone. pt states can you make him stop? spouse responds by closing flip phone to met patients need. pt very sensitive to distracting environment ? ?  ?Exercises   ?  ?Shoulder Instructions    ? ? ?Home Living Family/patient expects to be discharged to:: Private residence ?Living Arrangements: Spouse/significant other ?Available Help at Discharge: Family;Available 24 hours/day ?Type of Home: House ?Home Access: Stairs to enter ?Entrance Stairs-Number of Steps: 2-3 ?Entrance Stairs-Rails: Right ?Home Layout: One level ?  ?  ?Bathroom Shower/Tub: Tub/shower unit;Sponge bathes at baseline ?  ?Bathroom Toilet: Standard ?Bathroom Accessibility: Yes ?How Accessible: Accessible via walker ?Home Equipment: Conservation officer, nature (2 wheels);Cane - single point;Grab bars - tub/shower;BSC/3in1;Shower seat;Wheelchair - manual ?  ?  ?  ? ?  ?Prior Functioning/Environment Prior Level of Function : Needs assist ?  ?  ?  ?  ?  ?  ?  ?  ?  ? ?  ?  ?OT Problem List: Decreased strength;Decreased activity tolerance;Impaired balance (sitting and/or standing);Decreased safety awareness;Decreased knowledge of use of DME or AE;Decreased  knowledge of precautions;Cardiopulmonary status limiting activity;Decreased cognition ?  ?   ?OT Treatment/Interventions: Self-care/ADL training;Therapeutic exercise;Energy conservation;DME and/or AE instruction;Manual therapy;Therapeutic activities;Cognitive remediation/compensation;Patient/family education;Balance training  ?  ?OT Goals(Current goals can be found in the care plan section) Acute Rehab OT Goals ?Patient Stated Goal: to get out of the bed ?OT Goal Formulation: With patient/family ?Time For Goal Achievement: 08/06/21 ?Potential to Achieve Goals: Good  ?OT Frequency: Min 2X/week ?  ? ?Co-evaluation   ?  ?  ?  ?  ? ?  ?AM-PAC OT "6 Clicks" Daily Activity     ?Outcome Measure Help from another person eating meals?: A Little ?Help from another person taking care of personal grooming?: A Little ?Help from another person toileting, which includes using toliet, bedpan, or urinal?: A Lot ?Help from another person bathing (including washing, rinsing, drying)?: A Lot ?Help from another person to put on and taking off regular upper body clothing?: A Little ?Help from another person to put on and taking off regular lower body clothing?: A Lot ?6 Click Score: 15 ?  ?End of Session  Equipment Utilized During Treatment: Gait belt ?Nurse Communication: Mobility status;Precautions ? ?Activity Tolerance: Patient limited by fatigue ?Patient left: in chair;with call bell/phone within reach;with chair alarm set;with nursing/sitter in room;with family/visitor present ? ?OT Visit Diagnosis: Unsteadiness on feet (R26.81);Muscle weakness (generalized) (M62.81)  ?              ?Time: 8721-5872 ?OT Time Calculation (min): 28 min ?Charges:  OT General Charges ?$OT Visit: 1 Visit ?OT Evaluation ?$OT Eval Moderate Complexity: 1 Mod ?OT Treatments ?$Self Care/Home Management : 8-22 mins ? ? ?Brynn, OTR/L  ?Acute Rehabilitation Services ?Office: [phone] ?. ? ? ?Jeri Modena ?07/23/2021, 12:16 PM ?

## 2021-07-23 NOTE — Progress Notes (Addendum)
ID Brief Note  ? ?Remains afebrile, WBC went from 18.5 up to 24.7 ?On cefepime, metronidazole was dc'ed this morning  ?50 ml drain out put from 7 pm last night to 7 am this morning  ? ? ? ?Component 3 d ago  ?Specimen Description ABSCESS   ?Special Requests GALLBLADDER   ?Gram Stain ABUNDANT WBC PRESENT, PREDOMINANTLY PMN  ?MODERATE GRAM NEGATIVE RODS  ?FEW GRAM POSITIVE COCCI IN CHAINS  ?RARE GRAM POSITIVE RODS   ?Culture ABUNDANT ESCHERICHIA COLI  ?ABUNDANT MORGANELLA MORGANII  ?ABUNDANT BACTEROIDES OVATUS  ?BETA LACTAMASE POSITIVE  ?Performed at [facility], [place] [address] [place], [place] [address]   ?Report Status 07/23/2021 FINAL   ?Organism ID, Bacteria ESCHERICHIA COLI   ?Organism ID, Bacteria MORGANELLA MORGANII   ?Resulting Agency [facility]  ?  ? ?Susceptibility ? ? Escherichia coli Morganella morganii   ? MIC MIC   ? AMPICILLIN 8 SENSITIVE  Sensitive >=32 RESIST... Resistant   ? AMPICILLIN/SULBACTAM <=2 SENSITIVE  Sensitive >=32 RESIST... Resistant   ? CEFAZOLIN <=4 SENSITIVE  Sensitive >=64 RESIST... Resistant   ? CEFEPIME <=0.12 SENS... Sensitive     ? CEFTAZIDIME <=1 SENSITIVE  Sensitive <=1 SENSITIVE  Sensitive   ? CEFTRIAXONE <=0.25 SENS... Sensitive     ? CIPROFLOXACIN <=0.25 SENS... Sensitive <=0.25 SENS... Sensitive   ? GENTAMICIN <=1 SENSITIVE  Sensitive <=1 SENSITIVE  Sensitive   ? IMIPENEM <=0.25 SENS... Sensitive 8 INTERMEDI... Intermediate   ? PIP/TAZO <=4 SENSITIVE  Sensitive <=4 SENSITIVE  Sensitive   ? TRIMETH/SULFA <=20 SENSIT... Sensitive <=20 SENSIT... Sensitive   ?        ?  ?Susceptibility Comments ? ?Escherichia coli  ?ABUNDANT ESCHERICHIA COLI  ?Morganella morganii  ?ABUNDANT MORGANELLA MORGANII  ?  ?  ?  ? ? ?Recommendations ?Continue cefepime ?Start back metronidazole ?Fu CBC ?Monitor drain output ?CT abd/pelvis and Chest xray has been ordered, will follow.  ?D/w Dr Karleen Hampshire ? ?Rosiland Oz, MD ?Infectious Disease Physician ?[facility] ?[place] [address] ?[place], [place] [address] ?Phone: [phone]  Fax: [phone] ? ? ? ? ?

## 2021-07-23 NOTE — Progress Notes (Signed)
Physical Therapy Evaluation ?Patient Details ?Name: [name] ?MRN: [id number] ?DOB: [birth year] ?Today's Date: 07/23/2021 ? ?History of Present Illness ? 76 y.o. female s/p cholecystostomy by IR 5/5 with morganella morganii ecoli bacteremia.  Has dx also UTI sepsis. PMH 06/30/21 COVID-19 infection diagnosed 06/20/21 with COVID PNA.  T2DM, HLD, HTN, hx of TIA, depression/anxiety, metastatic pancreatic cancer currently undergoing chemotherapy (last infusion 06/11/21)  ?Clinical Impression ? Pt was seen for progression of mobility on Stedy to get up to stand and get to the bed.  Pt demonstrates better strength with testing mm's, but in standing up is tending to have low endurance and poor core control.  Her plan is to get to SNF care when acute care is completed, which will allow her to practice gait and balance tasks in a 24/7 care environment.  Pt is interested in this option, and is hoping to get back to complete independence with gait.  Husband is present to observe and ask questions, and understands PT will continue to work toward more tolerance to stand and step to reduce the time needed to stay in SNF care.   ?   ? ?Recommendations for follow up therapy are one component of a multi-disciplinary discharge planning process, led by the attending physician.  Recommendations may be updated based on patient status, additional functional criteria and insurance authorization. ? ?Follow Up Recommendations Skilled nursing-short term rehab (<3 hours/day) ? ?  ?Assistance Recommended at Discharge Frequent or constant Supervision/Assistance  ?Patient can return home with the following ? A little help with walking and/or transfers;A little help with bathing/dressing/bathroom;Assistance with cooking/housework;Direct supervision/assist for medications management;Direct supervision/assist for financial management;Assist for transportation;Help with stairs or ramp for entrance ? ?  ?Equipment Recommendations None recommended by  PT  ?Recommendations for Other Services ?    ?  ?Functional Status Assessment Patient has had a recent decline in their functional status and demonstrates the ability to make significant improvements in function in a reasonable and predictable amount of time.  ? ?  ?Precautions / Restrictions Precautions ?Precautions: Fall ?Precaution Comments: R flank Drain ?Restrictions ?Weight Bearing Restrictions: No ?Other Position/Activity Restrictions: pt has unpredictable tendency to lean back on the bed  ? ?  ? ?Mobility ? Bed Mobility ?Overal bed mobility: Needs Assistance ?Bed Mobility: Sit to Supine ?  ?  ?  ?Sit to supine: Mod assist ?  ?General bed mobility comments: pt became insistent she had pulled out a line, showed her the drain and IV ?  ? ?Transfers ?Overall transfer level: Needs assistance ?Equipment used: 2 person hand held assist ?Transfers: Sit to/from Stand ?Sit to Stand: Min assist, +2 safety/equipment ?  ?  ?  ?  ?  ?General transfer comment: pt had good follow through to stand but with stedy was in more controlled standing situation ?  ? ?Ambulation/Gait ?  ?  ?  ?  ?  ?  ?  ?General Gait Details: unable ? ?Stairs ?  ?  ?  ?  ?  ? ?Wheelchair Mobility ?  ? ?Modified Rankin (Stroke Patients Only) ?  ? ?  ? ?Balance Overall balance assessment: Mild deficits observed, not formally tested ?  ?  ?  ?  ?  ?  ?  ?  ?  ?  ?  ?  ?  ?  ?  ?  ?  ?  ?   ? ? ? ?Pertinent Vitals/Pain Pain Assessment ?Pain Assessment: No/denies pain  ? ? ?Home Living Family/patient  expects to be discharged to:: Private residence ?Living Arrangements: Spouse/significant other ?Available Help at Discharge: Family;Available 24 hours/day ?Type of Home: House ?Home Access: Stairs to enter ?Entrance Stairs-Rails: Right ?Entrance Stairs-Number of Steps: 2-3 ?  ?Home Layout: One level ?Home Equipment: Conservation officer, nature (2 wheels);Cane - single point;Grab bars - tub/shower;BSC/3in1;Shower seat;Wheelchair - manual ?   ?  ?Prior Function Prior Level  of Function : Needs assist ?  ?  ?  ?Physical Assist : Mobility (physical) ?Mobility (physical): Gait ?  ?Mobility Comments: did require a walker previously ?  ?  ? ? ?Hand Dominance  ? Dominant Hand: Right ? ?  ?Extremity/Trunk Assessment  ? Upper Extremity Assessment ?Upper Extremity Assessment: Defer to OT evaluation ?  ? ?Lower Extremity Assessment ?Lower Extremity Assessment: Generalized weakness ?  ? ?Cervical / Trunk Assessment ?Cervical / Trunk Assessment: Kyphotic (mild)  ?Communication  ? Communication: No difficulties  ?Cognition Arousal/Alertness: Awake/alert ?Behavior During Therapy: Flat affect ?Overall Cognitive Status: Impaired/Different from baseline ?  ?  ?  ?  ?  ?  ?  ?  ?  ?  ?  ?  ?  ?  ?  ?  ?General Comments: pt is requiring dense tactile and verbal cues to get through the sequence to stand on stedy and move to the bed ?  ?  ? ?  ?General Comments General comments (skin integrity, edema, etc.): pt's husband was calm, very interested in her progress and quiet ? ?  ?Exercises    ? ?Assessment/Plan  ?  ?PT Assessment Patient needs continued PT services  ?PT Problem List Decreased strength;Decreased range of motion;Decreased activity tolerance;Decreased balance;Decreased mobility;Decreased coordination;Decreased cognition;Decreased knowledge of use of DME;Decreased safety awareness ? ?   ?  ?PT Treatment Interventions DME instruction;Gait training;Functional mobility training;Stair training;Therapeutic activities;Therapeutic exercise;Balance training;Neuromuscular re-education;Patient/family education   ? ?PT Goals (Current goals can be found in the Care Plan section)  ?Acute Rehab PT Goals ?Patient Stated Goal: to get stronger ?PT Goal Formulation: With patient/family ?Time For Goal Achievement: 08/06/21 ?Potential to Achieve Goals: Good ? ?  ?Frequency Min 2X/week ?  ? ? ?Co-evaluation   ?  ?  ?  ?  ? ? ?  ?AM-PAC PT "6 Clicks" Mobility  ?Outcome Measure Help needed turning from your back to  your side while in a flat bed without using bedrails?: None ?Help needed moving from lying on your back to sitting on the side of a flat bed without using bedrails?: A Little ?Help needed moving to and from a bed to a chair (including a wheelchair)?: A Little ?Help needed standing up from a chair using your arms (e.g., wheelchair or bedside chair)?: A Little ?Help needed to walk in hospital room?: Total ?Help needed climbing 3-5 steps with a railing? : Total ?6 Click Score: 15 ? ?  ?End of Session Equipment Utilized During Treatment: Gait belt ?Activity Tolerance: Patient tolerated treatment well ?Patient left: in bed;with call bell/phone within reach;with bed alarm set;with nursing/sitter in room;with family/visitor present ?Nurse Communication: Mobility status ?PT Visit Diagnosis: Unsteadiness on feet (R26.81);Muscle weakness (generalized) (M62.81);Difficulty in walking, not elsewhere classified (R26.2) ?  ? ?Time: 6720-9470 ?PT Time Calculation (min) (ACUTE ONLY): 31 min ? ? ?Charges:   PT Evaluation ?$PT Eval Moderate Complexity: 1 Mod ?PT Treatments ?$Therapeutic Activity: 8-22 mins ?  ?   ? ?Ramond Dial ?07/23/2021, 4:15 PM ? ?Mee Hives, PT PhD ?Acute Rehab Dept. Number: [facility] [phone] and [place] [phone] ? ?

## 2021-07-23 NOTE — Progress Notes (Signed)
[facility]                                                                               [name], is a 76 y.o. female, DOB - 1945-07-02, WGN:[id number] Admit date - 07/18/2021    Outpatient Primary MD for the patient is Toni Ora, MD  LOS - 5  days    Brief summary   [name] is a 76 y.o. female with medical history significant for pancreatic cancer on chemotherapy, GERD, type 2 diabetes mellitus, acquired hypothyroidism, GERD, who is admitted to [facility] on 07/18/2021 with acute cholecystitis after presenting from home to [facility] ED for evaluation of altered mental status.   Assessment & Plan    Assessment and Plan: Severe sepsis from UTI, Acute cholecystitis and E coli ad Morganella bacteremia:  On admission pt was febrile, tachycardic >90/ min, lactic acid >2, wbc count <4000 , altered mental status.  Blood cultures positive for E coli and Morganella.  Pt presented with abdominal pain, nausea, decreased oral intake, and CT abd and pelvis showing GB wall thickening with associated GB edema, possible GB perforation on CT. Marland Kitchen  General surgery consulted, unfortunately she is not a surgical candidate. HIDA scan significant for acute cholecystitis. IR consulted and she underwent cholecystostomy.  She was empirically started on IV antibiotics transitioned to IV cefepime. Requested ID consult for the antibiotics and duration.  Blood cultures showing Morganella and E coli.  Meanwhile continue with IV fluids, and anti emetics.  Started the patient on clear liquid diet, advanced as tolerated. She is on soft diet tolerated without any nausea and vomiting. Worsening of leukocytosis. Recommend checking cbc today.   Diet-controlled diabetes mellitus ([name]) Most recent hemoglobin A1c noted to be 6.3% on 07/10/2021.   CBG (last 3)  Recent Labs    07/23/21 0007 07/23/21 0613 07/23/21 1140  GLUCAP 138* 142* 156*    Resume SSI. No changes in meds.     Acquired hypothyroidism Started the patient on synthroid.   GAD (generalized anxiety disorder) Prn ativan ordered.   Hyponatremia Sub-acute hypo-osmolar hyponatremia: Suspect an element of hypovolumeia,.  Differential also includes the possibility of a contribution from SIADH. TSH wnl.  Much improved to 134.    Acute metabolic encephalopathy Appears to have resolved. She is alert and oriented.  Possibly secondary to UTI, acute Cholecystitis.  vs from hyponatremia vs from dehydration.   Hypomagnesemia:  Replaced. Repeat level wnl.    Pancytopenia:  Mild anemia, thrombocytopenia and wbc count of 3.8. probably from chemotherapy.  Improved .  Urinary tract infection from E COLI and enterococcus:  On appropriate antibiotics   Adenocarcinoma of the Pancreas, stage 4, on chemotherapy  Follows up with Dr Truett Perna. S/p chemo last week.    Hypokalemia:  Replaced.    Constipation:  On senna, colace and miralax. She reports that she feels like having a BM today.   Pt deconditioned and debilitated Did not work with PT twice.  She needs to get out of bed and ambulate .   RN Pressure Injury Documentation: Pressure Injury 06/30/21 Thigh Posterior;Proximal;Right Stage 1 -  Intact skin with non-blanchable redness of a  localized area usually over a bony prominence. Intact darkened skin right gluteal fold (Active)  06/30/21 2000  Location: Thigh  Location Orientation: Posterior;Proximal;Right  Staging: Stage 1 -  Intact skin with non-blanchable redness of a localized area usually over a bony prominence.  Wound Description (Comments): Intact darkened skin right gluteal fold  Present on Admission: Yes  Dressing Type None 07/20/21 2100  Local wound care.  Hypertension:  BP parameters are well controlled.    COVID -19 infection on 06/20/2021: Completed course of antibiotics.   Estimated body mass index is 26.17 kg/m as calculated from the following:   Height as of this  encounter: 5\' 3"  (1.6 m).   Weight as of this encounter: 67 kg.  Code Status: full code.  DVT Prophylaxis:  heparin injection 5,000 Units Start: 07/18/21 2330 SCDs Start: 07/18/21 1925   Level of Care: Level of care: Med-Surg Family Communication: family at bedside.   Disposition Plan:     Remains inpatient appropriate:IV antibiotics.   Procedures:  HIDA SCAN  IR cholecystostomy.   Consultants:   General surgery IR.  Palliative care   Antimicrobials:   Anti-infectives (From admission, onward)    Start     Dose/Rate Route Frequency Ordered Stop   07/21/21 1430  fosfomycin (MONUROL) packet 3 g        3 g Oral  Once 07/21/21 1330 07/21/21 1512   07/21/21 1430  ceFEPIme (MAXIPIME) 2 g in sodium chloride 0.9 % 100 mL IVPB        2 g 200 mL/hr over 30 Minutes Intravenous Every 12 hours 07/21/21 1330     07/19/21 1700  vancomycin (VANCOREADY) IVPB 1250 mg/250 mL  Status:  Discontinued        1,250 mg 166.7 mL/hr over 90 Minutes Intravenous Every 24 hours 07/18/21 1543 07/18/21 1853   07/19/21 1700  vancomycin (VANCOCIN) IVPB 1000 mg/200 mL premix  Status:  Discontinued        1,000 mg 200 mL/hr over 60 Minutes Intravenous Every 24 hours 07/18/21 1853 07/18/21 1946   07/19/21 1315  metroNIDAZOLE (FLAGYL) IVPB 500 mg        500 mg 100 mL/hr over 60 Minutes Intravenous Every 12 hours 07/19/21 1313     07/19/21 1000  cefTRIAXone (ROCEPHIN) 2 g in sodium chloride 0.9 % 100 mL IVPB  Status:  Discontinued        2 g 200 mL/hr over 30 Minutes Intravenous Every 24 hours 07/19/21 0616 07/21/21 1330   07/19/21 0200  metroNIDAZOLE (FLAGYL) IVPB 500 mg  Status:  Discontinued        500 mg 100 mL/hr over 60 Minutes Intravenous Every 12 hours 07/18/21 1945 07/19/21 0616   07/19/21 0000  ceFEPIme (MAXIPIME) 2 g in sodium chloride 0.9 % 100 mL IVPB  Status:  Discontinued        2 g 200 mL/hr over 30 Minutes Intravenous Every 12 hours 07/18/21 1607 07/19/21 0616   07/18/21 1700  vancomycin  (VANCOREADY) IVPB 1500 mg/300 mL        1,500 mg 150 mL/hr over 120 Minutes Intravenous  Once 07/18/21 1543 07/18/21 1848   07/18/21 1545  ceFEPIme (MAXIPIME) 2 g in sodium chloride 0.9 % 100 mL IVPB  Status:  Discontinued        2 g 200 mL/hr over 30 Minutes Intravenous Every 12 hours 07/18/21 1543 07/18/21 1607   07/18/21 1530  aztreonam (AZACTAM) 2 g in sodium chloride 0.9 % 100 mL IVPB  Status:  Discontinued        2 g 200 mL/hr over 30 Minutes Intravenous  Once 07/18/21 1527 07/18/21 1534   07/18/21 1530  metroNIDAZOLE (FLAGYL) IVPB 500 mg        500 mg 100 mL/hr over 60 Minutes Intravenous  Once 07/18/21 1527 07/18/21 1652   07/18/21 1530  vancomycin (VANCOCIN) IVPB 1000 mg/200 mL premix  Status:  Discontinued        1,000 mg 200 mL/hr over 60 Minutes Intravenous  Once 07/18/21 1527 07/18/21 1540        Medications  Scheduled Meds:  amLODipine  5 mg Oral Daily   Chlorhexidine Gluconate Cloth  6 each Topical Daily   feeding supplement  1 Container Oral TID BM   heparin injection (subcutaneous)  5,000 Units Subcutaneous Q8H   insulin aspart  0-6 Units Subcutaneous Q6H   levothyroxine  125 mcg Oral QAC breakfast   polyethylene glycol  17 g Oral Daily   senna-docusate  2 tablet Oral BID   sodium chloride flush  10-40 mL Intracatheter Q12H   sodium chloride flush  5 mL Intracatheter Q8H   Continuous Infusions:  sodium chloride 75 mL/hr at 07/22/21 1558   ceFEPime (MAXIPIME) IV 2 g (07/23/21 1106)   metronidazole 500 mg (07/23/21 0022)   PRN Meds:.acetaminophen **OR** acetaminophen, ALPRAZolam, hydrALAZINE, HYDROmorphone (DILAUDID) injection, iohexol, lidocaine-prilocaine, naLOXone (NARCAN)  injection, prochlorperazine, prochlorperazine, sodium chloride flush, traMADol    Subjective:   Alexis Guild was seen and examined today. Nauseated today, requesting compazine.  Objective:   Vitals:   07/22/21 1959 07/23/21 0325 07/23/21 0800 07/23/21 1122  BP: 137/81 135/80  (!) 154/80 (!) 162/95  Pulse: 100 98 91   Resp: 18 18 18    Temp: 97.7 F (36.5 C) 98.2 F (36.8 C) 98.1 F (36.7 C)   TempSrc: Oral Oral Oral   SpO2: 95% 96% 95%   Weight:      Height:        Intake/Output Summary (Last 24 hours) at 07/23/2021 1215 Last data filed at 07/23/2021 0559 Gross per 24 hour  Intake 1270 ml  Output 50 ml  Net 1220 ml    Filed Weights   07/18/21 1511 07/21/21 0500  Weight: 67.1 kg 67 kg     Exam General exam: Ill appearing lady , not in distress. In a chair  Respiratory system: Clear to auscultation. Respiratory effort normal. Cardiovascular system: S1 & S2 heard, RRR.  No pedal edema. Gastrointestinal system: Abdomen is soft, non tender, with IR drain in place. Normal bowel sounds heard. Central nervous system: Alert and oriented. No focal neurological deficits. Extremities: no pedal edema.  Skin: no rashes seen. Psychiatry: Mood & affect appropriate.        Data Reviewed:  I have personally reviewed following labs and imaging studies   CBC Lab Results  Component Value Date   WBC 18.5 (H) 07/22/2021   RBC 3.65 (L) 07/22/2021   HGB 11.1 (L) 07/22/2021   HCT 33.6 (L) 07/22/2021   MCV 92.1 07/22/2021   MCH 30.4 07/22/2021   PLT 245 07/22/2021   MCHC 33.0 07/22/2021   RDW 15.4 07/22/2021   LYMPHSABS 2.0 07/22/2021   MONOABS 1.5 (H) 07/22/2021   EOSABS 0.2 07/22/2021   BASOSABS 0.1 07/22/2021     Last metabolic panel Lab Results  Component Value Date   NA 134 (L) 07/22/2021   K 3.8 07/22/2021   CL 109 07/22/2021   CO2 17 (L) 07/22/2021  BUN 11 07/22/2021   CREATININE 0.63 07/22/2021   GLUCOSE 119 (H) 07/22/2021   GFRNONAA >60 07/22/2021   GFRAA 71 02/02/2019   CALCIUM 8.0 (L) 07/22/2021   PROT 4.8 (L) 07/22/2021   ALBUMIN 1.7 (L) 07/22/2021   LABGLOB 2.2 02/02/2019   AGRATIO 2.0 02/02/2019   BILITOT 0.7 07/22/2021   ALKPHOS 138 (H) 07/22/2021   AST 20 07/22/2021   ALT 14 07/22/2021   ANIONGAP 8 07/22/2021    CBG  (last 3)  Recent Labs    07/23/21 0007 07/23/21 0613 07/23/21 1140  GLUCAP 138* 142* 156*       Coagulation Profile: Recent Labs  Lab 07/18/21 1503 07/19/21 0445  INR 1.2 1.3*      Radiology Studies: DG Abd 2 Views  Result Date: 07/22/2021 CLINICAL DATA:  Abdominal pain.  Pancreatic cancer. EXAM: ABDOMEN - 2 VIEW COMPARISON:  None Available. FINDINGS: A cover stent is identified in the mid abdomen, probably in the common bile duct. The recently placed cholecystostomy tube is projected in the right upper quadrant. Air-filled nondilated loops of bowel are identified. Mild ileus not excluded. No evidence of obstruction. IMPRESSION: 1. A stent is identified in the mid abdomen, likely in the common bile duct. 2. The cholecystostomy tube is identified. 3. Air-filled mildly prominent but nondilated loops of large and small bowel could represent mild ileus. No evidence of obstruction. Electronically Signed   By: Gerome Sam III M.D.   On: 07/22/2021 19:29       Kathlen Mody M.D. [facility] 07/23/2021, 12:15 PM  Available via Epic secure chat 7am-7pm After 7 pm, please refer to night coverage provider listed on amion.

## 2021-07-23 NOTE — Progress Notes (Addendum)
HEMATOLOGY-ONCOLOGY PROGRESS NOTE ? ?ASSESSMENT AND PLAN: ?Adenocarcinoma the pancreas body, stage IV (cT4,cN0,pM1) ?Ultrasound abdomen 08/18/2020-possible hypoechoic pancreas body mass, small hypoechoic liver areas ?MRI abdomen 08/27/2020-pancreas body mass, multiple hepatic lesions consistent with metastases, right abdominal omental nodularity, pancreas mass extends to the celiac bifurcation and abuts the splenic vein and splenoportal confluence ?CT chest 09/07/2020-scattered pulmonary nodules concerning for metastases, pancreas body mass, hepatic metastases ?Ultrasound-guided biopsy of a right liver lesion 09/08/2020-adenocarcinoma consistent with a pancreas primary ?CT abdomen/pelvis 09/24/2020-pancreas neck mass, omental and peritoneal nodularity-unchanged, multiple hypoenhancing liver masses-unchanged, numerous small pulmonary nodules ?Cycle 1 gemcitabine/Abraxane 10/04/2020 ?Cycle 2 gemcitabine/Abraxane 10/27/2020 ?Cycle 3 gemcitabine/Abraxane 11/11/2018 ?Cycle 4 gemcitabine/Abraxane 11/23/2020 ?Cycle 5 gemcitabine/Abraxane 12/08/2020 ?CT abdomen/pelvis 12/18/2020-stable pancreas mass, stable and improved liver lesions, resolution of omental soft tissue density, stable indeterminate lung nodules, no disease progression ?Cycle 6 gemcitabine/Abraxane 12/22/2020 ?Cycle 7 gemcitabine 01/05/2021, 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  ?Cycle 8 gemcitabine/Abraxane 01/19/2021 ?Cycle 9 gemcitabine/Abraxane 02/01/2021 ?Cycle 10 Gemcitabine 02/16/2021, Abraxane held due to increased neuropathy ?Cycle 11 Gemcitabine 03/05/2021, 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 ?Cycle 12 gemcitabine 03/14/2021, 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 ?Cycle 13 gemcitabine 04/02/2021, 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 ?Cycle 14 gemcitabine/Abraxane 04/16/2021, Abraxane resumed at a reduced dose ?Cycle 15 gemcitabine/Abraxane 04/30/2021 ?CT abdomen/pelvis 05/08/2021-stable pancreas mass, liver metastases are slightly smaller, stable tiny bilateral lower lung nodules ?Cycle  16 gemcitabine/Abraxane 05/14/2021, Abraxane escalated back to 100 mg per metered squared ?Cycle 17 gemcitabine/Abraxane 05/28/2021 ?Cycle 18 gemcitabine/Abraxane 06/11/2021 ?Cycle 19 gemcitabine/Abraxane 07/09/2021 ?  ?2.  Pain secondary #1 ?3.  Diabetes ?4.  Hypertension ?5.  Osteoarthritis ?6.  Port-A-Cath placement 09/22/2020 ?7.  Admission with jaundice 09/24/2020.  MRI-new abrupt constriction of the common hepatic duct.  The infiltrative pancreatic mass extends medially in the porta hepatis to obstruct the common hepatic duct.  Multiple right hepatic lobe metastases mildly increased in size.  Stent placed into the common bile duct 09/28/2020. ?8.  COVID-19 06/20/2021, admission with COVID and bacterial pneumonia?  06/30/2021-completed course of antibiotics ?9.  Hospital admission 07/18/2021-bacteremia, UTI, acute cholecystitis ? ?[name] has been admitted to the hospital with bacteremia, UTI, acute cholecystitis.  She is status post percutaneous chole drain placement.  Abdominal pain improving.  She remains on IV antibiotics.  Management per general surgery/primary team.  ID also following and assisting with antibiotics. ? ?CT abdomen/pelvis performed on admission was reviewed and discussed with the patient.  There is 1 liver lesion in the inferior right lobe of the liver which was not well visualized on the prior exam.  However, this may be due to the timing of the contrast.  Overall, scan appears to be stable.  Plan from our standpoint is to resume chemotherapy as an outpatient once she recovers from her acute infection and cholecystitis. ? ?Recommendations: ?1.  Continue management of acute cholecystitis per general surgery/primary team. ?2.  Antibiotics per primary team/ID. ?3.  Continue current pain medications. ?4.  We will arrange outpatient follow-up at the cancer to resume chemotherapy with gemcitabine/Abraxane. ? ?[name], DNP, AGPCNP-BC, AOCNP ? ?[name] was interviewed and examined.  I reviewed  the chart and 07/18/2021 CT abdomen/pelvis.  She is admitted with cholecystitis/bacteremia, potentially related to the pancreas mass and bile duct stent.  There is no clear clinical or radiologic evidence of progressive pancreas cancer. ? ?[name] is scheduled for chemotherapy tomorrow.  Chemotherapy will be placed on hold until she recovers from the bacteremia. ? ?I was present for greater than 50% of today's visit.  I  performed medical decision making. ? ?SUBJECTIVE: [name] is followed by our office for stage IV pancreatic cancer.  She has been receiving treatment with gemcitabine and Abraxane.  Last cycle was given on 06/11/2021.  Her chemotherapy has been placed on hold due to recent COVID-19 infection with bacterial pneumonia.  Now admitted with severe sepsis from UTI, acute cholecystitis, and E. coli and Morganella bacteremia.  Status post percutaneous chole drain placement.  ID following for bacteremia. ? ?Today, patient still has some abdominal discomfort.  She is trying to eat a little bit more but does not have much of an appetite.  She is not currently having any nausea or vomiting.  Afebrile. ? ?Oncology History Overview Note  ?Cancer Staging ?Primary pancreatic cancer with metastasis to other site [facility]) ?Staging form: Exocrine Pancreas, AJCC 8th Edition ?- Clinical stage from 09/08/2020: Stage IV (cT4, cN0, pM1) - Signed by Truitt Merle, MD on 09/12/2020 ?Stage prefix: Initial diagnosis ?Total positive nodes: 0 ? ?  ?Primary pancreatic cancer with metastasis to other site [facility])  ?08/27/2020 Imaging  ? Abdominal MRI w wo contrast: ?IMPRESSION: ?1. Pancreatic body infiltrative mass, favoring adenocarcinoma. ?2. Multiple hepatic lesions, most consistent with metastasis. ?3. Right abdominal omental nodularity, highly suspicious for ?peritoneal metastasis. ?4. Recommend multidisciplinary oncology consultation for eventual ?sampling of either the liver or pancreatic lesions. ?5. Vascular involvement with  tumor, as detailed above. ?  ?09/07/2020 Imaging  ? CT chest  ?IMPRESSION: ?1. Scattered small pulmonary nodules, worrisome for metastatic ?disease. ?2. Pancreatic body mass and hepatic metastases, better seen and ?described on MR abdomen 08/27/2020. ?3. Aortic atherosclerosis (ICD10-I70.0). Coronary artery ?calcification. ?  ?09/08/2020 Cancer Staging  ? Staging form: Exocrine Pancreas, AJCC 8th Edition ?- Clinical stage from 09/08/2020: Stage IV (cT4, cN0, pM1) - Signed by Truitt Merle, MD on 09/12/2020 ?Stage prefix: Initial diagnosis ?Total positive nodes: 0 ? ?  ?09/08/2020 Pathology Results  ? A. LIVER, RIGHT, BIOPSY:  ?- Adenocarcinoma.  See comment.  ? ?COMMENT:  ? ?The morphology is consistent with a pancreatic primary.  ?  ?09/12/2020 Initial Diagnosis  ? Primary pancreatic cancer with metastasis to other site [facility]) ? ?  ?10/12/2020 -  Chemotherapy  ? Patient is on Treatment Plan : PANCREATIC Abraxane / Gemcitabine D1,8,15 q28d  ? ?  ?  ? ?PHYSICAL EXAMINATION: ? ?Vitals:  ? 07/22/21 1959 07/23/21 0325  ?BP: 137/81 135/80  ?Pulse: 100 98  ?Resp: 18 18  ?Temp: 97.7 ?F (36.5 ?C) 98.2 ?F (36.8 ?C)  ?SpO2: 95% 96%  ? ?Filed Weights  ? 07/18/21 1511 07/21/21 0500  ?Weight: 67.1 kg 67 kg  ? ? ?Intake/Output from previous day: ?05/07 0701 - 05/08 0700 ?In: 3716 [P.O.:240; I.V.:750; IV Piggyback:400] ?Out: 50 [Drains:50] ? ?Physical Exam ?Vitals reviewed.  ?Constitutional:   ?   General: She is not in acute distress. ?HENT:  ?   Head: Normocephalic.  ?Eyes:  ?   General: No scleral icterus. ?   Conjunctiva/sclera: Conjunctivae normal.  ?Cardiovascular:  ?   Rate and Rhythm: Normal rate.  ?Pulmonary:  ?   Effort: Pulmonary effort is normal. No respiratory distress.  ?Abdominal:  ?   Palpations: Abdomen is soft.  ?   Comments: Percutaneous chole drain in place.   ?Skin: ?   General: Skin is warm and dry.  ?Neurological:  ?   Mental Status: She is alert and oriented to person, place, and time.  ? ? ?LABORATORY DATA:  ?I have  reviewed the data as listed ? ?  Latest Ref Rng & Units 07/22/2021  ? 12:49 AM 07/21/2021  ?  2:33 AM 07/20/2021  ?  1:58 AM  ?CMP  ?Glucose 70 - 99 mg/dL 119   140   123    ?BUN 8 - 23 mg/dL '11   12   13    '$ ?[place]

## 2021-07-23 NOTE — Progress Notes (Signed)
? ?                                                                                                                                                     ?                                                   ?Daily Progress Note  ? ?Patient Name: [name]       Date: 07/23/2021 ?DOB: 1945-05-28  Age: 76 y.o. MRN#: [id number] ?Attending Physician: Hosie Poisson, MD ?Primary Care Physician: [name], MD ?Admit Date: 07/18/2021 ? ?Reason for Consultation/Follow-up: Establishing goals of care ? ?Subjective: ?Medical records reviewed. Patient assessed at the bedside. Patient sitting in bedside chair, no complaints. Her husband is present during my visit.  ? ?I introduced Palliative Medicine to patient's husband as specialized medical care for people living with serious illness. It focuses on providing relief from the symptoms and stress of a serious illness. The goal is to improve quality of life for both the patient and the family. I emphasized the importance of planning for best case and worst case scenario outcomes and ongoing goals of care conversations throughout the course of a serious illness. ? ?A detailed discussion regarding advanced directives was had. Patient shares that she has already completed this although not on file.  ? ?Concepts specific to code status, artifical feeding and hydration, and rehospitalization were considered and discussed. Patient confirms desire for full code and full scope treatment. She believes she has already filled out a MOST form accordingly but also not on file. She would be willing to go to SNF for rehab if recommended and has a facility preference, although can't remember the name right now. Her goal is to recover from her acute infection and resume chemotherapy with Dr. Benay Spice.  ? ?Given patient's preference to focus on rest and recovery from acute illness at this time, the benefits of outpatient palliative care follow up were reviewed and a referral to imbedded PMT  cancer center clinic was offered. Patient is agreeable to a referral and feels that ongoing symptom management and support through advocacy would be helpful moving forward. ?  ?Questions and concerns addressed. PMT will continue to support holistically.  ? ?Length of Stay: 5 ? ?Current Medications: ?Scheduled Meds:  ? amLODipine  5 mg Oral Daily  ? Chlorhexidine Gluconate Cloth  6 each Topical Daily  ? feeding supplement  1 Container Oral TID BM  ? heparin injection (subcutaneous)  5,000 Units Subcutaneous Q8H  ? insulin aspart  0-6 Units Subcutaneous Q6H  ? levothyroxine  125 mcg Oral QAC breakfast  ? polyethylene glycol  17 g Oral Daily  ?  senna-docusate  2 tablet Oral BID  ? sodium chloride flush  10-40 mL Intracatheter Q12H  ? sodium chloride flush  5 mL Intracatheter Q8H  ? ? ?Continuous Infusions: ? sodium chloride 75 mL/hr at 07/22/21 1558  ? ceFEPime (MAXIPIME) IV 2 g (07/23/21 1106)  ? ? ?PRN Meds: ?acetaminophen **OR** acetaminophen, ALPRAZolam, hydrALAZINE, HYDROmorphone (DILAUDID) injection, iohexol, lidocaine-prilocaine, naLOXone (NARCAN)  injection, prochlorperazine, prochlorperazine, sodium chloride flush, traMADol ? ?Physical Exam ?Vitals and nursing note reviewed.  ?Constitutional:   ?   General: She is not in acute distress. ?   Appearance: She is ill-appearing.  ?Cardiovascular:  ?   Rate and Rhythm: Normal rate.  ?Pulmonary:  ?   Effort: Pulmonary effort is normal.  ?Skin: ?   General: Skin is warm and dry.  ?Neurological:  ?   Mental Status: She is alert and oriented to person, place, and time.  ?Psychiatric:     ?   Mood and Affect: Mood normal.  ?         ? ?Vital Signs: BP (!) 162/95   Pulse 91   Temp 98.1 ?F (36.7 ?C) (Oral)   Resp 18   Ht '5\' 3"'$  (1.6 m)   Wt 67 kg   SpO2 95%   BMI 26.17 kg/m?  ?SpO2: SpO2: 95 % ?O2 Device: O2 Device: Room Air ?O2 Flow Rate: O2 Flow Rate (L/min): 2 L/min ? ?Intake/output summary:  ?Intake/Output Summary (Last 24 hours) at 07/23/2021 1259 ?Last data filed  at 07/23/2021 0559 ?Gross per 24 hour  ?Intake 1270 ml  ?Output 50 ml  ?Net 1220 ml  ? ? ?LBM: Last BM Date : 07/17/21 ?Baseline Weight: Weight: 67.1 kg ?Most recent weight: Weight: 67 kg ? ?     ?Palliative Assessment/Data: 30% ? ? ? ? ? ?Patient Active Problem List  ? Diagnosis Date Noted  ? Cholecystitis 07/19/2021  ? Acute cholecystitis 07/18/2021  ? Acute metabolic encephalopathy 25/85/2778  ? Hyponatremia 07/18/2021  ? Elevated liver enzymes 06/30/2021  ? Pancreatic adenocarcinoma ([place]) 09/25/2020  ? Hyperbilirubinemia 09/24/2020  ? Primary pancreatic cancer with metastasis to other site [facility]) 09/12/2020  ? Vitamin B12 deficiency 10/20/2019  ? GAD (generalized anxiety disorder) 10/20/2019  ? Diet-controlled diabetes mellitus ([place]) 10/20/2019  ? Osteoarthritis, multiple sites 10/20/2019  ? Presbycusis of both ears 04/13/2019  ? Essential tremor 04/13/2019  ? Bilateral primary osteoarthritis of knee 09/23/2017  ? Acquired hypothyroidism 05/24/2012  ? Alcohol abuse, daily use 05/24/2012  ? Essential hypertension   ? ? ?Palliative Care Assessment & Plan  ? ?Patient Profile: ?76 y.o. female  with past medical history of DM, TIA, HTN, alcohol abuse, pancreatic cancer currently (actively receiving chemotherapy) Recent COVID PNA  admitted on 07/18/2021 with AMS, fatigue, cough and anorexia. ?  ?In the ED, CT could not exclude gallbladder rupture or perforation. HIDA scan with concern for acute cholecystitis and patient is s/p chlecystostomy today 5/5. PMT has been consulted to assist with goals of care conversation.  ? ?Assessment: ?76 y.o. female  with past medical history of DM, TIA, HTN, alcohol abuse, pancreatic cancer currently (actively receiving chemotherapy) Recent COVID PNA  admitted on 07/18/2021 with AMS, fatigue, cough and anorexia. ?  ?In the ED, CT could not exclude gallbladder rupture or perforation. HIDA scan with concern for acute cholecystitis and patient is s/p chlecystostomy today 5/5. PMT has been  consulted to assist with goals of care conversation.  ? ?Recommendations/Plan: ?Continue full code/full scope treatment ?Patient would be open to SNF ?  Patient is agreeable to outpatient palliative care referral, will discuss with my colleague Alda Lea NP ?PMT will continue to follow ? ? ?Prognosis: ? Unable to determine ? ?Discharge Planning: ?To Be Determined ? ? ? ?MDM: High ? ? ?Dorthy Cooler, PA-C ?Palliative Medicine Team ?Team phone # [phone] ? ?Thank you for allowing the Palliative Medicine Team to assist in the care of this patient. Please utilize secure chat with additional questions, if there is no response within 30 minutes please call the above phone number. ? ?Palliative Medicine Team providers are available by phone from 7am to 7pm daily and can be reached through the team cell phone.  ?Should this patient require assistance outside of these hours, please call the patient's attending physician.  ? ?

## 2021-07-23 NOTE — Progress Notes (Signed)
Initial Nutrition Assessment ? ?DOCUMENTATION CODES:  ?Not applicable ? ?INTERVENTION:  ?Continue current diet as ordered, encourage PO intake. Consider liberalization to regular to allow for maximum choices ?Continue Boost Breeze po TID, each supplement provides 250 kcal and 9 grams of protein ?MVI with minerals daily ? ?NUTRITION DIAGNOSIS:  ?Inadequate oral intake related to poor appetite as evidenced by meal completion < 25%, per patient/family report. ? ?GOAL:  ?Patient will meet greater than or equal to 90% of their needs ? ?MONITOR:  ?PO intake, Supplement acceptance, Labs ? ?REASON FOR ASSESSMENT:  ?Consult ?Assessment of nutrition requirement/status ? ?ASSESSMENT:  ?Pt with hx of stage IV pancreatic cancer (active chemo), DM, HTN, HLD, hx EtOH abuse (none since 6/22), thyroid dx, and vitamin b12 deficiency presented to ED with AMS, fatigue, and poor appetite. Workup consistent for cholecystitis. ? ?5/5 - chlecystostomy in radiology ?   ?Pt resting in bed at the time of assessment. Daughter at bedside assists with nutrition hx. Pt looks quite tired and deconditioned.  ? ?Lunch tray present at bedside with a few bites consumed. Mostly untouched. Pt reports that appetite has been poor at home for quite some time but that this is significantly worse than usual. Reports that at home, she eats two meals a day. Breakfast is his largest meal and appetite wanes as the day goes on. States for breakfast she will either have eggs or cheerios with whole milk and for lunch she will have chicken or fish and a vegetable. ? ?Pt does not like the milk-based ensure supplements. Boost breeze at bedside unopened, has not tried the supplement yet. Pt reports having taste changes (everything tastes sweet) and that she is also having trouble tolerating acidic items like juice or chocolate as they cause GI pain. Also reports she doesn't like cheese anymore. ? ?Talked to pt about intake at home and ways to increase kcal and protein  content of foods she is consuming at baseline. Encouraged dips with fruits and vegetables, powdered milk when cooking, and nuts when able. Pt is agreeable to trying magic cup while admitted. Encouraged family to bring items for pt from home that may be more appetizing at the moment. ? ?Noted that pt is being followed by outpatient RD at the cancer center. Pt plans to continue chemo after leaving hospital. ? ?Average Meal Intake: ?5/5: 0% intake x 1 recorded meals ? ?Nutritionally Relevant Medications: ?Scheduled Meds: ? Boost Breeze  1 Container Oral TID BM  ? insulin aspart  0-6 Units Subcutaneous Q6H  ? levothyroxine  125 mcg Oral QAC breakfast  ? polyethylene glycol  17 g Oral Daily  ? senna-docusate  2 tablet Oral BID  ? ?Continuous Infusions: ? sodium chloride 75 mL/hr at 07/22/21 1558  ? ceFEPime (MAXIPIME) IV 2 g (07/23/21 1106)  ? ?PRN Meds: prochlorperazine ? ?Labs Reviewed: ?Sodium 134 ?CBG ranges from 115-156 mg/dL over the last 24 hours ?HgbA1c 6.3% (4/25) ? ?NUTRITION - FOCUSED PHYSICAL EXAM: ?Flowsheet Row Most Recent Value  ?Orbital Region Mild depletion  ?Upper Arm Region No depletion  ?Thoracic and Lumbar Region No depletion  ?Buccal Region No depletion  ?Temple Region Mild depletion  ?Clavicle Bone Region No depletion  ?Clavicle and Acromion Bone Region No depletion  ?Scapular Bone Region No depletion  ?Dorsal Hand No depletion  ?Patellar Region No depletion  ?Anterior Thigh Region No depletion  ?Posterior Calf Region No depletion  ?Edema (RD Assessment) None  ?Hair Reviewed  ?Eyes Reviewed  ?Mouth Reviewed  ?Skin Reviewed  ?  Nails Reviewed  ? ?Diet Order:   ?Diet Order   ? ?       ?  DIET SOFT Room service appropriate? Yes; Fluid consistency: Thin  Diet effective now       ?  ? ?  ?  ? ?  ? ? ?EDUCATION NEEDS:  ?Education needs have been addressed ? ?Skin:  Skin Assessment: Reviewed RN Assessment ? ?Last BM:  5/2 ? ?Height:  ?Ht Readings from Last 1 Encounters:  ?07/18/21 '5\' 3"'$  (1.6 m)  ? ?Weight:   ?Wt Readings from Last 1 Encounters:  ?07/21/21 67 kg  ? ?Ideal Body Weight:  52.3 kg ? ?BMI:  Body mass index is 26.17 kg/m?. ? ?Estimated Nutritional Needs:  ?Kcal:  1800-2000 kcal/d ?Protein:  90-100 g/d ?Fluid:  1.8-2L/d ? ? ?Ranell Patrick, RD, LDN ?Clinical Dietitian ?RD pager # available in [place]  ?After hours/weekend pager # available in [place] ?

## 2021-07-24 ENCOUNTER — Inpatient Hospital Stay (HOSPITAL_COMMUNITY): Payer: Medicare HMO

## 2021-07-24 ENCOUNTER — Inpatient Hospital Stay: Payer: Medicare HMO

## 2021-07-24 ENCOUNTER — Telehealth: Payer: Self-pay

## 2021-07-24 DIAGNOSIS — A419 Sepsis, unspecified organism: Secondary | ICD-10-CM | POA: Diagnosis not present

## 2021-07-24 DIAGNOSIS — K810 Acute cholecystitis: Secondary | ICD-10-CM | POA: Diagnosis not present

## 2021-07-24 DIAGNOSIS — I2699 Other pulmonary embolism without acute cor pulmonale: Secondary | ICD-10-CM | POA: Diagnosis not present

## 2021-07-24 DIAGNOSIS — E039 Hypothyroidism, unspecified: Secondary | ICD-10-CM | POA: Diagnosis not present

## 2021-07-24 DIAGNOSIS — Z7189 Other specified counseling: Secondary | ICD-10-CM | POA: Diagnosis not present

## 2021-07-24 DIAGNOSIS — A4151 Sepsis due to Escherichia coli [E. coli]: Secondary | ICD-10-CM

## 2021-07-24 DIAGNOSIS — K819 Cholecystitis, unspecified: Secondary | ICD-10-CM

## 2021-07-24 DIAGNOSIS — R6520 Severe sepsis without septic shock: Secondary | ICD-10-CM

## 2021-07-24 DIAGNOSIS — G9341 Metabolic encephalopathy: Secondary | ICD-10-CM | POA: Diagnosis not present

## 2021-07-24 DIAGNOSIS — F411 Generalized anxiety disorder: Secondary | ICD-10-CM | POA: Diagnosis not present

## 2021-07-24 HISTORY — PX: IR THORACENTESIS ASP PLEURAL SPACE W/IMG GUIDE: IMG5380

## 2021-07-24 LAB — CBC WITH DIFFERENTIAL/PLATELET
Abs Immature Granulocytes: 2.37 10*3/uL — ABNORMAL HIGH (ref 0.00–0.07)
Basophils Absolute: 0.2 10*3/uL — ABNORMAL HIGH (ref 0.0–0.1)
Basophils Relative: 1 %
Eosinophils Absolute: 0.1 10*3/uL (ref 0.0–0.5)
Eosinophils Relative: 0 %
HCT: 33 % — ABNORMAL LOW (ref 36.0–46.0)
Hemoglobin: 11.1 g/dL — ABNORMAL LOW (ref 12.0–15.0)
Immature Granulocytes: 12 %
Lymphocytes Relative: 9 %
Lymphs Abs: 1.7 10*3/uL (ref 0.7–4.0)
MCH: 30.9 pg (ref 26.0–34.0)
MCHC: 33.6 g/dL (ref 30.0–36.0)
MCV: 91.9 fL (ref 80.0–100.0)
Monocytes Absolute: 1.4 10*3/uL — ABNORMAL HIGH (ref 0.1–1.0)
Monocytes Relative: 7 %
Neutro Abs: 14.5 10*3/uL — ABNORMAL HIGH (ref 1.7–7.7)
Neutrophils Relative %: 71 %
Platelets: 267 10*3/uL (ref 150–400)
RBC: 3.59 MIL/uL — ABNORMAL LOW (ref 3.87–5.11)
RDW: 15.7 % — ABNORMAL HIGH (ref 11.5–15.5)
WBC: 20.2 10*3/uL — ABNORMAL HIGH (ref 4.0–10.5)
nRBC: 0.1 % (ref 0.0–0.2)

## 2021-07-24 LAB — COMPREHENSIVE METABOLIC PANEL
ALT: 13 U/L (ref 0–44)
AST: 21 U/L (ref 15–41)
Albumin: 1.7 g/dL — ABNORMAL LOW (ref 3.5–5.0)
Alkaline Phosphatase: 183 U/L — ABNORMAL HIGH (ref 38–126)
Anion gap: 7 (ref 5–15)
BUN: 9 mg/dL (ref 8–23)
CO2: 18 mmol/L — ABNORMAL LOW (ref 22–32)
Calcium: 7.7 mg/dL — ABNORMAL LOW (ref 8.9–10.3)
Chloride: 110 mmol/L (ref 98–111)
Creatinine, Ser: 0.67 mg/dL (ref 0.44–1.00)
GFR, Estimated: >60 mL/min (ref >60)
Glucose, Bld: 151 mg/dL — ABNORMAL HIGH (ref 70–99)
Potassium: 3.5 mmol/L (ref 3.5–5.1)
Sodium: 135 mmol/L (ref 135–145)
Total Bilirubin: 0.9 mg/dL (ref 0.3–1.2)
Total Protein: 4.9 g/dL — ABNORMAL LOW (ref 6.5–8.1)

## 2021-07-24 LAB — PROTIME-INR
INR: 1.2 (ref 0.8–1.2)
Prothrombin Time: 15.3 seconds — ABNORMAL HIGH (ref 11.4–15.2)

## 2021-07-24 LAB — GLUCOSE, PLEURAL OR PERITONEAL FLUID: Glucose, Fluid: 135 mg/dL

## 2021-07-24 LAB — GLUCOSE, CAPILLARY
Glucose-Capillary: 141 mg/dL — ABNORMAL HIGH (ref 70–99)
Glucose-Capillary: 145 mg/dL — ABNORMAL HIGH (ref 70–99)
Glucose-Capillary: 148 mg/dL — ABNORMAL HIGH (ref 70–99)
Glucose-Capillary: 148 mg/dL — ABNORMAL HIGH (ref 70–99)
Glucose-Capillary: 161 mg/dL — ABNORMAL HIGH (ref 70–99)

## 2021-07-24 LAB — BODY FLUID CELL COUNT WITH DIFFERENTIAL
Lymphs, Fluid: 6 %
Monocyte-Macrophage-Serous Fluid: 4 % — ABNORMAL LOW (ref 50–90)
Neutrophil Count, Fluid: 90 % — ABNORMAL HIGH (ref 0–25)
Total Nucleated Cell Count, Fluid: 1860 cu mm — ABNORMAL HIGH (ref 0–1000)

## 2021-07-24 LAB — LACTATE DEHYDROGENASE, PLEURAL OR PERITONEAL FLUID: LD, Fluid: 171 U/L — ABNORMAL HIGH (ref 3–23)

## 2021-07-24 LAB — PATHOLOGIST SMEAR REVIEW

## 2021-07-24 LAB — PROTEIN, PLEURAL OR PERITONEAL FLUID: Total protein, fluid: <3 g/dL

## 2021-07-24 LAB — METHYLMALONIC ACID, SERUM: Methylmalonic Acid, Quantitative: 112 nmol/L (ref 0–378)

## 2021-07-24 MED ORDER — LIDOCAINE HCL (PF) 1 % IJ SOLN
INTRAMUSCULAR | Status: AC | PRN
  Administered 2021-07-24: 10 mL

## 2021-07-24 MED ORDER — MAGNESIUM HYDROXIDE 400 MG/5ML PO SUSP
30.0000 mL | Freq: Once | ORAL | Status: AC
  Administered 2021-07-24: 30 mL via ORAL
  Filled 2021-07-24: qty 30

## 2021-07-24 MED ORDER — LIDOCAINE HCL 1 % IJ SOLN
INTRAMUSCULAR | Status: AC
  Filled 2021-07-24: qty 20

## 2021-07-24 MED ORDER — HEPARIN SODIUM (PORCINE) 5000 UNIT/ML IJ SOLN: 5000.0000 [IU] | Freq: Three times a day (TID) | INTRAMUSCULAR | Status: DC

## 2021-07-24 MED ORDER — HEPARIN (PORCINE) 25000 UT/250ML-% IV SOLN
1100.0000 [IU]/h | INTRAVENOUS | Status: DC
  Administered 2021-07-24 – 2021-07-25 (×2): 1100 [IU]/h via INTRAVENOUS
  Filled 2021-07-24 (×2): qty 250

## 2021-07-24 MED ORDER — METRONIDAZOLE 500 MG PO TABS
500.0000 mg | ORAL_TABLET | Freq: Two times a day (BID) | ORAL | Status: DC
  Administered 2021-07-25 – 2021-08-01 (×15): 500 mg via ORAL
  Filled 2021-07-24 (×16): qty 1

## 2021-07-24 MED ORDER — IOHEXOL 300 MG/ML  SOLN
100.0000 mL | Freq: Once | INTRAMUSCULAR | Status: AC | PRN
  Administered 2021-07-24: 100 mL via INTRAVENOUS

## 2021-07-24 MED ORDER — IOHEXOL 350 MG/ML SOLN
100.0000 mL | Freq: Once | INTRAVENOUS | Status: AC | PRN
  Administered 2021-07-24: 50 mL via INTRAVENOUS

## 2021-07-24 NOTE — Progress Notes (Signed)
? ? ? [facility] ?                                                                            ? ? ?[name], is a 76 y.o. female, DOB - 1946-02-06, KCL:[id number] ?Admit date - 07/18/2021    ?Outpatient Primary MD for the patient is Toni Arnt, MD ? ?LOS - 6  days ? ? ? ?Brief summary  ? ?[name] is a 76 y.o. female with medical history significant for pancreatic cancer on chemotherapy, GERD, type 2 diabetes mellitus, acquired hypothyroidism, GERD, who is admitted to [facility] on 07/18/2021 with acute cholecystitis after presenting from home to [facility] ED for evaluation of altered mental status. Patient was found to be in sepsis from acute cholecystitis, E coli and Morganella bacteremia and UTI. She was started on broad spectrum antibiotics and narrowed to cefepime and flagyl. ID on board and following. As her leukocytosis started worsening, a repeat CT abd and pelvis was done showing new subcapsular fluid collection adjacent to the liver, and an incidental PE was found in the left lowe lobe pulmonary artery. It will be followed up with a CTA of the chest for further evaluation.  ? ? ?Assessment & Plan  ? ? ?Assessment and Plan: ?Severe sepsis from UTI, Acute cholecystitis and E coli ad Morganella bacteremia:  ?On admission pt was febrile, tachycardic >90/ min, lactic acid >2, wbc count <4000 , altered mental status.  Blood cultures positive for E coli and Morganella.  ?Pt presented with abdominal pain, nausea, decreased oral intake, and CT abd and pelvis showing GB wall thickening with associated GB edema, possible GB perforation on CT. Marland Kitchen  ?General surgery consulted, unfortunately she is not a surgical candidate. HIDA scan significant for acute cholecystitis.  ?She was empirically started on IV antibiotics transitioned to IV cefepime and flagyl.  ?Requested ID consult for the antibiotics and duration.  ?IR consulted and she underwent cholecystostomy.  ?Blood cultures showing  Morganella and E coli sensitive to cefepime.  ?Fluid cultures from the GB show E coli , Morganella and bacteroids. She is already on cefepime and flagyl.  ?Started the patient on clear liquid diet, advanced as tolerated. She is on soft diet tolerated without any nausea and vomiting. ?In view of her persistent leukocytosis, a repeat CT abd and pelvis was done showing a a new subcapsular fluid collection near the liver spanning about 17 cm and a incidental PE involving the  ?Left lower lobe Pulmonary artery, moderate right pleural effusion with compressive atelectasis of the right lower lobe.  ?IR reconsulted to see if the subcapsular fluid needs to be aspirated.  ? ? ?New incidental PE involving the left lower lobe pulmonary artery.  ?Heparin per pharmacy.  ?CTA of the chest further evaluation.  ?Venous duplex of the lower extremities to evaluate for DVT.  ?Pt remains on RA, denies sob, and not hypoxic or hypotensive.  ? ? ?Moderate right pleural effusion with compressive atelectasis of the RLL:  ?IR Consulted for thoracentesis and fluid to be sent for analysis and cytology.  ? ? ?Diet-controlled diabetes mellitus ([place]) ?Most recent hemoglobin A1c noted to be 6.3% on 07/10/2021.   ?Resume  SSI. No changes in meds.  ? ? ?Acquired hypothyroidism ?On synthroid.  ? ?GAD (generalized anxiety disorder) ?Prn ativan ordered.  ? ?Hyponatremia ?Sub-acute hypo-osmolar hyponatremia: Suspect an element of hypovolumeia,.  Differential also includes the possibility of a contribution from SIADH. ?TSH wnl.  ?Much improved to 134.  ? ? ?Acute metabolic encephalopathy ?Appears to have resolved. She is alert and oriented.  ?Possibly secondary to UTI, acute Cholecystitis.  vs from hyponatremia vs from dehydration.  ? ?Hypomagnesemia:  ?Replaced. Repeat level wnl.  ? ? ?Pancytopenia:  ?Much improved.  ? ?Urinary tract infection from E COLI and  incidental Enterococcus faecium:  ?On appropriate antibiotics  ? ?Adenocarcinoma of the Pancreas,  stage 4, on chemotherapy  ?Follows up with Dr [name]. S/p chemo last week.  ?Dr [name] following the patient.  ?  ?Hypokalemia:  ?Replaced.  ? ? ?Constipation:  ?Unable to have a BM since admission, MOM added to the regimen of Senna, colace and Miralax.  ? ?RN Pressure Injury Documentation: ?Pressure Injury 06/30/21 Thigh Posterior;Proximal;Right Stage 1 -  Intact skin with non-blanchable redness of a localized area usually over a bony prominence. Intact darkened skin right gluteal fold (Active)  ?06/30/21 2000  ?Location: Thigh  ?Location Orientation: Posterior;Proximal;Right  ?Staging: Stage 1 -  Intact skin with non-blanchable redness of a localized area usually over a bony prominence.  ?Wound Description (Comments): Intact darkened skin right gluteal fold  ?Present on Admission: Yes  ?Dressing Type None 07/24/21 0107  ?Local wound care. ? ?Hypertension:  ?BP parameters are suboptimal.  ?She was started her on home meds, norvasc 5 mg daily and prn hydralazine.  ? ? ?COVID -19 infection on 06/20/2021: ?Completed course of antibiotics.  ? ?In view of her multiple co morbidities, debility and clinical decline, palliative care consulted for goals of care and discussions are ongoing.  ?Therapy evaluations are recommending SNF.  ? ? ?Estimated body mass index is 30.62 kg/m? as calculated from the following: ?  Height as of this encounter: '5\' 3"'$  (1.6 m). ?  Weight as of this encounter: 78.4 kg. ? ?Code Status: full code.  ?DVT Prophylaxis:  heparin injection 5,000 Units Start: 07/18/21 2330 ?SCDs Start: 07/18/21 1925 ? ? ?Level of Care: Level of care: Med-Surg ?Family Communication: family at bedside.  ? ?Disposition Plan:     Remains inpatient appropriate:IV antibiotics.  ? ?Procedures:  ?HIDA SCAN  ?IR cholecystostomy.  ?CT abd and pelvis ?CTA of the chest ?Venous duplex of the lower extremity.  ?IR thoracentesis.  ? ?Consultants:   ?General surgery ?IR.  ?Palliative care ?Oncology. ? ? ?Antimicrobials:   ? ?Anti-infectives (From admission, onward)  ? ? Start     Dose/Rate Route Frequency Ordered Stop  ? 07/24/21 1530  metroNIDAZOLE (FLAGYL) tablet 500 mg       ? 500 mg Oral Every 12 hours 07/24/21 0843    ? 07/23/21 1530  metroNIDAZOLE (FLAGYL) IVPB 500 mg  Status:  Discontinued       ? 500 mg ?100 mL/hr over 60 Minutes Intravenous Every 12 hours 07/23/21 1441 07/24/21 0843  ? 07/21/21 1430  fosfomycin (MONUROL) packet 3 g       ? 3 g Oral  Once 07/21/21 1330 07/21/21 1512  ? 07/21/21 1430  ceFEPIme (MAXIPIME) 2 g in sodium chloride 0.9 % 100 mL IVPB       ? 2 g ?200 mL/hr over 30 Minutes Intravenous Every 12 hours 07/21/21 1330    ? 07/19/21 1700  vancomycin (VANCOREADY) IVPB  1250 mg/250 mL  Status:  Discontinued       ? 1,250 mg ?166.7 mL/hr over 90 Minutes Intravenous Every 24 hours 07/18/21 1543 07/18/21 1853  ? 07/19/21 1700  vancomycin (VANCOCIN) IVPB 1000 mg/200 mL premix  Status:  Discontinued       ? 1,000 mg ?200 mL/hr over 60 Minutes Intravenous Every 24 hours 07/18/21 1853 07/18/21 1946  ? 07/19/21 1315  metroNIDAZOLE (FLAGYL) IVPB 500 mg  Status:  Discontinued       ? 500 mg ?100 mL/hr over 60 Minutes Intravenous Every 12 hours 07/19/21 1313 07/23/21 1218  ? 07/19/21 1000  cefTRIAXone (ROCEPHIN) 2 g in sodium chloride 0.9 % 100 mL IVPB  Status:  Discontinued       ? 2 g ?200 mL/hr over 30 Minutes Intravenous Every 24 hours 07/19/21 0616 07/21/21 1330  ? 07/19/21 0200  metroNIDAZOLE (FLAGYL) IVPB 500 mg  Status:  Discontinued       ? 500 mg ?100 mL/hr over 60 Minutes Intravenous Every 12 hours 07/18/21 1945 07/19/21 0616  ? 07/19/21 0000  ceFEPIme (MAXIPIME) 2 g in sodium chloride 0.9 % 100 mL IVPB  Status:  Discontinued       ? 2 g ?200 mL/hr over 30 Minutes Intravenous Every 12 hours 07/18/21 1607 07/19/21 0616  ? 07/18/21 1700  vancomycin (VANCOREADY) IVPB 1500 mg/300 mL       ? 1,500 mg ?150 mL/hr over 120 Minutes Intravenous  Once 07/18/21 1543 07/18/21 1848  ? 07/18/21 1545  ceFEPIme (MAXIPIME) 2  g in sodium chloride 0.9 % 100 mL IVPB  Status:  Discontinued       ? 2 g ?200 mL/hr over 30 Minutes Intravenous Every 12 hours 07/18/21 1543 07/18/21 1607  ? 07/18/21 1530  aztreonam (AZACTAM) 2 g in sodium chloride 0

## 2021-07-24 NOTE — NC FL2 (Signed)
?[place] MEDICAID FL2 LEVEL OF CARE SCREENING TOOL  ?  ? ?IDENTIFICATION  ?Patient Name: ?[name] Birthdate: January 10, 1946 Sex: female Admission Date (Current Location): ?07/18/2021  ?[place] and [place] Number: ? [place] ?  Facility and Address:  ?[facility], [place] [address] [place], [place] [address] ?     Provider Number: ?[id number]  ?Attending Physician Name and Address:  ?Hosie Poisson, MD ? Relative Name and Phone Number:  ?  ?   ?Current Level of Care: ?Hospital Recommended Level of Care: ?[place] Prior Approval Number: ?  ? ?Date Approved/Denied: ?  PASRR Number: ?[id number] A ? ?Discharge Plan: ?SNF ?  ? ?Current Diagnoses: ?Patient Active Problem List  ? Diagnosis Date Noted  ? Sepsis ([place])   ? Goals of care, counseling/discussion   ? Cholecystitis 07/19/2021  ? Acute cholecystitis 07/18/2021  ? Acute metabolic encephalopathy 35/36/1443  ? Hyponatremia 07/18/2021  ? Elevated liver enzymes 06/30/2021  ? Pancreatic adenocarcinoma ([place]) 09/25/2020  ? Hyperbilirubinemia 09/24/2020  ? 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 Vitamin B12 deficiency 10/20/2019  ? GAD (generalized anxiety disorder) 10/20/2019  ? Diet-controlled diabetes mellitus ([place]) 10/20/2019  ? Osteoarthritis, multiple sites 10/20/2019  ? Presbycusis of both ears 04/13/2019  ? Essential tremor 04/13/2019  ? Bilateral primary osteoarthritis of knee 09/23/2017  ? Acquired hypothyroidism 05/24/2012  ? Alcohol abuse, daily use 05/24/2012  ? Essential hypertension   ? ? ?Orientation RESPIRATION BLADDER Height & Weight   ?  ?Self, Time, Situation, Place ? Normal Continent Weight: 172 lb 13.5 oz (78.4 kg) ?Height:  '5\' 3"'$  (160 cm)  ?BEHAVIORAL SYMPTOMS/MOOD NEUROLOGICAL BOWEL NUTRITION STATUS  ?    Continent Diet  ?AMBULATORY STATUS COMMUNICATION OF NEEDS Skin   ?Extensive Assist Verbally Normal, PU Stage and Appropriate Care (stage 1 right thigh) ?  ?  ?  ?    ?      ?     ? ? ?Personal Care Assistance Level of Assistance  ?Bathing, Feeding, Dressing Bathing Assistance: Limited assistance ?Feeding assistance: Limited assistance ?Dressing Assistance: Limited assistance ?   ? ?Functional Limitations Info  ?Sight, Hearing, Speech Sight Info: Adequate ?Hearing Info: Adequate ?Speech Info: Adequate  ? ? ?SPECIAL CARE FACTORS FREQUENCY  ?PT (By licensed PT), OT (By licensed OT)   ?  ?PT Frequency: 5x a week ?OT Frequency: 5x a week ?  ?  ?  ?   ? ? ?Contractures Contractures Info: Not present  ? ? ?Additional Factors Info  ?Code Status, Allergies Code Status Info: Full ?Allergies Info: Penicillins ?  ?  ?  ?   ? ?Current Medications (07/24/2021):  This is the current hospital active medication list ?Current Facility-Administered Medications  ?Medication Dose Route Frequency Provider Last Rate Last Admin  ? acetaminophen (TYLENOL) tablet 650 mg  650 mg Oral Q6H PRN Hosie Poisson, MD      ? Or  ? acetaminophen (TYLENOL) suppository 650 mg  650 mg Rectal Q6H PRN Hosie Poisson, MD      ? ALPRAZolam Duanne Moron) tablet 0.25 mg  0.25 mg Oral Daily PRN Hosie Poisson, MD      ? amLODipine (NORVASC) tablet 5 mg  5 mg Oral Daily Hosie Poisson, MD      ? ceFEPIme (MAXIPIME) 2 g in sodium chloride 0.9 % 100 mL IVPB  2 g Intravenous Q12H Hosie Poisson, MD 200 mL/hr at 07/24/21 0949 2 g at 07/24/21 0949  ? Chlorhexidine Gluconate  Cloth 2 % PADS 6 each  6 each Topical Daily Hosie Poisson, MD   6 each at 07/24/21 [id number]  ? feeding supplement (BOOST / RESOURCE BREEZE) liquid 1 Container  1 Container Oral TID BM Hosie Poisson, MD   1 Container at 07/23/21 2103  ? [START ON 07/25/2021] heparin injection 5,000 Units  5,000 Units Subcutaneous Q8H Monia Sabal, PA-C      ? hydrALAZINE (APRESOLINE) tablet 25 mg  25 mg Oral Q8H PRN Hosie Poisson, MD      ? HYDROmorphone (DILAUDID) injection 0.5 mg  0.5 mg Intravenous Q2H PRN Hosie Poisson, MD   0.5 mg at 07/23/21 2057  ? insulin aspart (novoLOG) injection 0-6 Units   0-6 Units Subcutaneous Q6H Hosie Poisson, MD   1 Units at 07/24/21 0601  ? iohexol (OMNIPAQUE) 300 MG/ML solution 100 mL  100 mL Per Tube Once PRN Hosie Poisson, MD      ? levothyroxine (SYNTHROID) tablet 125 mcg  125 mcg Oral QAC breakfast Hosie Poisson, MD   125 mcg at 07/24/21 0550  ? lidocaine (PF) (XYLOCAINE) 1 % injection    PRN Soyla Dryer R, NP   10 mL at 07/24/21 1059  ? lidocaine (XYLOCAINE) 1 % (with pres) injection           ? lidocaine-prilocaine (EMLA) cream   Topical PRN Hosie Poisson, MD   Given at 07/23/21 1115  ? metroNIDAZOLE (FLAGYL) tablet 500 mg  500 mg Oral Q12H Rosiland Oz, MD      ? multivitamin with minerals tablet 1 tablet  1 tablet Oral Daily Hosie Poisson, MD      ? naloxone (NARCAN) injection 0.4 mg  0.4 mg Intravenous PRN Hosie Poisson, MD      ? polyethylene glycol (MIRALAX / GLYCOLAX) packet 17 g  17 g Oral Daily Hosie Poisson, MD      ? prochlorperazine (COMPAZINE) injection 5 mg  5 mg Intravenous Q6H PRN Hosie Poisson, MD   5 mg at 07/24/21 1000  ? prochlorperazine (COMPAZINE) tablet 5 mg  5 mg Oral Q6H PRN Hosie Poisson, MD      ? senna-docusate (Senokot-S) tablet 2 tablet  2 tablet Oral BID Hosie Poisson, MD   2 tablet at 07/23/21 2057  ? sodium chloride flush (NS) 0.9 % injection 10-40 mL  10-40 mL Intracatheter Q12H Hosie Poisson, MD   10 mL at 07/24/21 0957  ? sodium chloride flush (NS) 0.9 % injection 10-40 mL  10-40 mL Intracatheter PRN Hosie Poisson, MD      ? sodium chloride flush (NS) 0.9 % injection 10-40 mL  10-40 mL Intracatheter Q12H Hosie Poisson, MD   10 mL at 07/24/21 0957  ? sodium chloride flush (NS) 0.9 % injection 10-40 mL  10-40 mL Intracatheter PRN Hosie Poisson, MD      ? sodium chloride flush (NS) 0.9 % injection 5 mL  5 mL Intracatheter Q8H Hosie Poisson, MD   5 mL at 07/24/21 0949  ? traMADol (ULTRAM) tablet 50 mg  50 mg Oral Q6H PRN Hosie Poisson, MD   50 mg at 07/23/21 1546  ? ? ? ?Discharge Medications: ?Please see discharge summary for a  list of discharge medications. ? ?Relevant Imaging Results: ? ?Relevant Lab Results: ? ? ?Additional Information ?[phone] ? ?Emeterio Reeve, LCSW ? ? ? ? ?

## 2021-07-24 NOTE — Plan of Care (Signed)

## 2021-07-24 NOTE — Plan of Care (Signed)
  Problem: Pain Managment: Goal: General experience of comfort will improve Outcome: Progressing   Problem: Safety: Goal: Ability to remain free from injury will improve Outcome: Progressing   

## 2021-07-24 NOTE — Progress Notes (Signed)
Lower extremity venous bilateral study completed. ? ?Preliminary results relayed to Karleen Hampshire, MD and Tessie Fass, RN via secure chat. ? ?See CV Proc for preliminary results report.  ? ?Darlin Coco, RDMS, RVT ? ?

## 2021-07-24 NOTE — Care Management Important Message (Signed)
Important Message ? ?Patient Details  ?Name: [name] ?MRN: [id number] ?Date of Birth: 1945/11/15 ? ? ?Medicare Important Message Given:  Yes ? ? ? ? ?Kaydi Kley ?07/24/2021, 2:53 PM ?

## 2021-07-24 NOTE — Procedures (Signed)
PROCEDURE SUMMARY: ? ?Successful US guided right thoracentesis. ?Yielded 350 ml of clear yellow fluid. ?Pt tolerated procedure well. ?No immediate complications. ? ?Specimen sent for labs. ?CXR ordered; no post-procedure pneumothorax identified ? ?EBL < 2 mL ? ?Theresa Duty, NP ?07/24/2021 ?11:42 AM ? ? ? ?

## 2021-07-24 NOTE — Consult Note (Signed)
? ?Chief Complaint: ?Patient was seen in consultation today for subcapsular fluid collection adjacent to liver drain placement ?Chief Complaint  ?Patient presents with  ? Code Sepsis  ? at the request of Dr Venia Minks ? ?Supervising Physician: Corrie Mckusick ? ?Patient Status: [facility] - In-pt ? ?History of Present Illness: ?[name] is a 76 y.o. female  ? ?Hx Pancreatic cancer--chemotherapy ?DM; GERD; Hypothyroid; Rt pleural effusion (thora this am 350 cc yellow fluid) ?Acute cholecystitis and IR placed drain 07/20/21 ?Draining well ?Less abd pain ?Worsening leukocytosis ?CT this am:  IMPRESSION: ?Interval placement of cholecystostomy tube with decompression of the ?gallbladder since prior study. ?New subcapsular fluid collection adjacent to the liver, the largest ?component measuring up to 17.6 cm. ?Stable pancreatic body mass. ?Moderate free fluid in the pelvis. ?Moderate-sized right pleural effusion with compressive atelectasis ?in the right lower lobe. Trace left pleural effusion. ?Incidentally noted pulmonary embolus within the left lower lobe ?pulmonary artery. This could be fully evaluated with CTA chest. ? ? ?Request made for subcapsular collection drain placement ?Dr Earleen Newport has reviewed imaging and approves procedure ? ? ?Past Medical History:  ?Diagnosis Date  ? Anxiety   ? Arthritis   ? Cancer [facility])   ? pancreatic  ? Depression   ? Diabetes mellitus without complication ([place])   ? Hyperlipemia   ? Hypertension   ? Thyroid disease   ? TIA (transient ischemic attack)   ? Vitamin B12 deficiency 10/20/2019  ? ? ?Past Surgical History:  ?Procedure Laterality Date  ? ABDOMINAL HYSTERECTOMY    ? BILIARY STENT PLACEMENT N/A 09/28/2020  ? Procedure: BILIARY STENT PLACEMENT;  Surgeon: Clarene Essex, MD;  Location: [facility] ENDOSCOPY;  Service: Endoscopy;  Laterality: N/A;  ? BREAST BIOPSY    ? ERCP N/A 09/28/2020  ? Procedure: ENDOSCOPIC RETROGRADE CHOLANGIOPANCREATOGRAPHY (ERCP);  Surgeon: Clarene Essex, MD;  Location: [facility];  Service: Endoscopy;  Laterality: N/A;  ? IR IMAGING GUIDED PORT INSERTION  09/22/2020  ? IR PERC CHOLECYSTOSTOMY  07/20/2021  ? SPHINCTEROTOMY  09/28/2020  ? Procedure: SPHINCTEROTOMY;  Surgeon: Clarene Essex, MD;  Location: [facility];  Service: Endoscopy;;  ? TUBAL LIGATION    ? ? ?Allergies: ?Penicillins ? ?Medications: ?Prior to Admission medications   ?Medication Sig Start Date End Date Taking? Authorizing Provider  ?ACCU-CHEK AVIVA PLUS test strip As needed 05/09/21   [name], MD  ?Accu-Chek Softclix Lancets lancets As needed 04/23/21   [name], MD  ?albuterol (VENTOLIN HFA) 108 (90 Base) MCG/ACT inhaler Inhale 2 puffs into the lungs every 6 (six) hours as needed for wheezing or shortness of breath. ?Patient not taking: Reported on 07/10/2021 07/02/21   Flora Lipps, MD  ?Alcohol Swabs PADS USE AS DIRECTED 05/18/21   [name], MD  ?ALPRAZolam Duanne Moron) 0.5 MG tablet Take 1 tablet (0.5 mg total) by mouth daily as needed for anxiety. 11/23/20   Owens Shark, NP  ?amLODipine (NORVASC) 5 MG tablet Take 5 mg by mouth daily. ?Patient not taking: Reported on 07/10/2021 03/30/21   [provider]  ?ascorbic acid (VITAMIN C) 500 MG tablet Take 1 tablet (500 mg total) by mouth daily. ?Patient not taking: Reported on 07/10/2021 07/03/21 08/02/21  Flora Lipps, MD  ?aspirin EC 81 MG tablet Take 81 mg by mouth every morning.    [provider]  ?Blood Glucose Monitoring Suppl (ACCU-CHEK GUIDE) w/Device KIT As needed 05/11/21   [name], MD  ?cyanocobalamin (,VITAMIN B-12,) 1000 MCG/ML injection Inject  1 mL (1,000 mcg total) into the muscle every 30 (thirty) days. 01/11/21   [name], MD  ?docusate sodium (COLACE) 100 MG capsule Take 100 mg by mouth 2 (two) times daily as needed (constipation).    [provider]  ?guaiFENesin-dextromethorphan (ROBITUSSIN DM) 100-10 MG/5ML syrup Take 10 mLs by mouth every 4 (four) hours as needed for cough. ?Patient not taking:  Reported on 07/10/2021 07/02/21   Flora Lipps, MD  ?levothyroxine (SYNTHROID) 125 MCG tablet TAKE 1 TABLET ON AN EMPTY STOMACH IN THE MORNING ?Patient taking differently: Take 125 mcg by mouth daily before breakfast. 06/04/21   [name], MD  ?lidocaine-prilocaine (EMLA) cream APPLY 1 APPLICATION TOPICALLY AS NEEDED. ?Patient taking differently: Apply 1 application. topically daily as needed (numbing). 07/04/21   Ladell Pier, MD  ?omeprazole (PRILOSEC) 20 MG capsule TAKE 1 CAPSULE BY MOUTH EVERY DAY ?Patient taking differently: Take 20 mg by mouth daily. 04/18/21   [name], MD  ?ondansetron (ZOFRAN) 8 MG tablet Take 1 tablet (8 mg total) by mouth 2 (two) times daily as needed (Nausea or vomiting). ?Patient not taking: Reported on 07/10/2021 09/12/20   Truitt Merle, MD  ?prochlorperazine (COMPAZINE) 10 MG tablet Take 1 tablet (10 mg total) by mouth every 6 (six) hours as needed (Nausea or vomiting). 09/12/20   Truitt Merle, MD  ?zinc sulfate 220 (50 Zn) MG capsule Take 1 capsule (220 mg total) by mouth daily. ?Patient not taking: Reported on 07/06/2021 07/03/21 08/02/21  Flora Lipps, MD  ?  ? ?Family History  ?Problem Relation Age of Onset  ? Diabetes Mother   ? Alzheimer's disease Mother   ? Dementia Mother   ? COPD Father   ? Emphysema Father   ? Heart disease Sister   ? Alcohol abuse Brother   ? Heart disease Brother   ? Emphysema Brother   ? Heart attack Daughter   ? Cancer Other   ?     breast cancer  ? ? ?Social History  ? ?Socioeconomic History  ? Marital status: Married  ?  Spouse name: Not on file  ? Number of children: Not on file  ? Years of education: Not on file  ? Highest education level: Not on file  ?Occupational History  ? Occupation: retired  ?  Comment: Geophysicist/field seismologist  ? Occupation: caregiver  ?Tobacco Use  ? Smoking status: Former  ?  Packs/day: 1.00  ?  Years: 10.00  ?  Pack years: 10.00  ?  Types: Cigarettes  ?  Quit date: 05/25/1987  ?  Years since quitting: 34.1  ? Smokeless tobacco:  Never  ?Vaping Use  ? Vaping Use: Never used  ?Substance and Sexual Activity  ? Alcohol use: Yes  ?  Alcohol/week: 21.0 standard drinks  ?  Types: 21 Glasses of wine per week  ?  Comment: 3 glasses of wine daily, she stopped in 08/2020  ? Drug use: No  ? Sexual activity: Not Currently  ?  Birth control/protection: Post-menopausal  ?Other Topics Concern  ? Not on file  ?Social History Narrative  ? Not on file  ? ?Social Determinants of Health  ? ?Financial Resource Strain: Low Risk   ? Difficulty of Paying Living Expenses: Not hard at all  ?Food Insecurity: No Food Insecurity  ? Worried About Charity fundraiser in the Last Year: Never true  ? Ran Out of Food in the Last Year: Never true  ?Transportation Needs: No Transportation Needs  ?  Lack of Transportation (Medical): No  ? Lack of Transportation (Non-Medical): No  ?Physical Activity: Insufficiently Active  ? Days of Exercise per Week: 2 days  ? Minutes of Exercise per Session: 50 min  ?Stress: No Stress Concern Present  ? Feeling of Stress : Only a little  ?Social Connections: Socially Integrated  ? Frequency of Communication with Friends and Family: More than three times a week  ? Frequency of Social Gatherings with Friends and Family: More than three times a week  ? Attends Religious Services: 1 to 4 times per year  ? Active Member of Clubs or Organizations: Yes  ? Attends Archivist Meetings: 1 to 4 times per year  ? Marital Status: Married  ? ? ?Review of Systems: A 12 point ROS discussed and pertinent positives are indicated in the HPI above.  All other systems are negative. ? ?Review of Systems  ?Constitutional:  Positive for activity change, appetite change and fatigue. Negative for fever.  ?Respiratory:  Negative for cough and shortness of breath.   ?Cardiovascular:  Negative for chest pain.  ?Gastrointestinal:  Positive for abdominal pain and nausea.  ?Musculoskeletal:  Negative for back pain.  ?Neurological:  Positive for weakness.   ?Psychiatric/Behavioral:  Negative for behavioral problems and confusion.   ? ?Vital Signs: ?BP (!) 156/88 (BP Location: Left Arm)   Pulse 97   Temp 97.9 ?F (36.6 ?C) (Oral)   Resp 18   Ht _0  (1.6 m)   Wt 172 lb 13

## 2021-07-24 NOTE — Progress Notes (Signed)
? ?                                                                                                                                                     ?                                                   ?Daily Progress Note  ? ?Patient Name: [name]       Date: 07/24/2021 ?DOB: Oct 09, 1945  Age: 76 y.o. MRN#: [id number] ?Attending Physician: Hosie Poisson, MD ?Primary Care Physician: [name], MD ?Admit Date: 07/18/2021 ? ?Reason for Consultation/Follow-up: Establishing goals of care ? ?Subjective: ?Medical records reviewed including progress notes, labs, imaging. Patient assessed at the bedside after return from CTA. Her husband [name] and sister-in-law [name] are present during my visit.   ? ?Reviewed recent complications including perihepatic fluid collection, PE, pleural effusion. Reviewed plan for further workup and treatment. I shared my worry that in light of this turn of events goals of care conversations are becoming increasingly important, attempting to explore patient's thoughts and feelings on her current illness. Counseled on the importance of planning for the worst while hoping for the best, as these complications may further delay chemotherapy treatment and impact overall trajectory of her pancreatic cancer. Inquired whether [name] has considered this yet and offered to discuss - she tells me she is just too tired to talk.  ? ?Called patient's daughter [name] and was unable to reach. A voicemail with contact information was provided and family was encouraged to call with additional needs. ?  ?Questions and concerns addressed. PMT will continue to support holistically.  ? ?Length of Stay: 6 ? ?Current Medications: ?Scheduled Meds:  ? amLODipine  5 mg Oral Daily  ? Chlorhexidine Gluconate Cloth  6 each Topical Daily  ? feeding supplement  1 Container Oral TID BM  ? [START ON 07/25/2021] heparin injection (subcutaneous)  5,000 Units Subcutaneous Q8H  ? insulin aspart  0-6 Units Subcutaneous Q6H   ? levothyroxine  125 mcg Oral QAC breakfast  ? lidocaine      ? metroNIDAZOLE  500 mg Oral Q12H  ? multivitamin with minerals  1 tablet Oral Daily  ? polyethylene glycol  17 g Oral Daily  ? senna-docusate  2 tablet Oral BID  ? sodium chloride flush  10-40 mL Intracatheter Q12H  ? sodium chloride flush  10-40 mL Intracatheter Q12H  ? sodium chloride flush  5 mL Intracatheter Q8H  ? ? ?Continuous Infusions: ? ceFEPime (MAXIPIME) IV 2 g (07/24/21 0949)  ? ? ?PRN Meds: ?acetaminophen **OR** acetaminophen, ALPRAZolam, hydrALAZINE, HYDROmorphone (DILAUDID) injection, iohexol, lidocaine (PF), lidocaine-prilocaine, naLOXone (NARCAN)  injection, prochlorperazine,  prochlorperazine, sodium chloride flush, sodium chloride flush, traMADol ? ?Physical Exam ?Vitals and nursing note reviewed.  ?Constitutional:   ?   General: She is not in acute distress. ?   Appearance: She is ill-appearing.  ?Cardiovascular:  ?   Rate and Rhythm: Normal rate.  ?Pulmonary:  ?   Effort: Pulmonary effort is normal.  ?Skin: ?   General: Skin is warm and dry.  ?Neurological:  ?   Mental Status: She is alert and oriented to person, place, and time.  ?Psychiatric:     ?   Mood and Affect: Mood normal.  ?         ? ?Vital Signs: BP (!) 156/88 (BP Location: Left Arm)   Pulse 97   Temp 97.9 ?F (36.6 ?C) (Oral)   Resp 18   Ht '5\' 3"'$  (1.6 m)   Wt 78.4 kg   SpO2 97%   BMI 30.62 kg/m?  ?SpO2: SpO2: 97 % ?O2 Device: O2 Device: Room Air ?O2 Flow Rate: O2 Flow Rate (L/min): 2 L/min ? ?Intake/output summary:  ?Intake/Output Summary (Last 24 hours) at 07/24/2021 1315 ?Last data filed at 07/24/2021 0107 ?Gross per 24 hour  ?Intake 1155 ml  ?Output 320 ml  ?Net 835 ml  ? ? ?LBM: Last BM Date : 07/18/21 ?Baseline Weight: Weight: 67.1 kg ?Most recent weight: Weight: 78.4 kg ? ?     ?Palliative Assessment/Data: 40% ? ? ? ? ? ?Patient Active Problem List  ? Diagnosis Date Noted  ? Sepsis ([place])   ? Goals of care, counseling/discussion   ? Cholecystitis 07/19/2021  ?  Acute cholecystitis 07/18/2021  ? Acute metabolic encephalopathy 54/65/6812  ? Hyponatremia 07/18/2021  ? Elevated liver enzymes 06/30/2021  ? Pancreatic adenocarcinoma ([place]) 09/25/2020  ? Hyperbilirubinemia 09/24/2020  ? 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 Vitamin B12 deficiency 10/20/2019  ? GAD (generalized anxiety disorder) 10/20/2019  ? Diet-controlled diabetes mellitus ([place]) 10/20/2019  ? Osteoarthritis, multiple sites 10/20/2019  ? Presbycusis of both ears 04/13/2019  ? Essential tremor 04/13/2019  ? Bilateral primary osteoarthritis of knee 09/23/2017  ? Acquired hypothyroidism 05/24/2012  ? Alcohol abuse, daily use 05/24/2012  ? Essential hypertension   ? ? ?Palliative Care Assessment & Plan  ? ?Patient Profile: ?76 y.o. female  with past medical history of DM, TIA, HTN, alcohol abuse, pancreatic cancer currently (actively receiving chemotherapy) Recent COVID PNA  admitted on 07/18/2021 with AMS, fatigue, cough and anorexia. ?  ?In the ED, CT could not exclude gallbladder rupture or perforation. HIDA scan with concern for acute cholecystitis and patient is s/p chlecystostomy today 5/5. PMT has been consulted to assist with goals of care conversation.  ? ?Assessment: ?76 y.o. female  with past medical history of DM, TIA, HTN, alcohol abuse, pancreatic cancer currently (actively receiving chemotherapy) Recent COVID PNA  admitted on 07/18/2021 with AMS, fatigue, cough and anorexia. ?  ?In the ED, CT could not exclude gallbladder rupture or perforation. HIDA scan with concern for acute cholecystitis and patient is s/p chlecystostomy today 5/5. PMT has been consulted to assist with goals of care conversation.  ? ?Recommendations/Plan: ?Goals remain clear to continue full code/full scope treatment ?Patient continues to defer detailed [place] discussions due to fatigue ?Outpatient palliative care follow up at cancer center has been arranged ?PMT will continue to  follow ? ? ?Prognosis: ? Unable to determine ? ?Discharge Planning: ?To Be Determined ? ? ? ?MDM: High ? ?Dorthy Cooler, PA-C ?Palliative Medicine Team ?  Team phone # [phone] ? ?Thank you for allowing the Palliative Medicine Team to assist in the care of this patient. Please utilize secure chat with additional questions, if there is no response within 30 minutes please call the above phone number. ? ?Palliative Medicine Team providers are available by phone from 7am to 7pm daily and can be reached through the team cell phone.  ?Should this patient require assistance outside of these hours, please call the patient's attending physician.  ? ?

## 2021-07-24 NOTE — Progress Notes (Addendum)
? ?[facility] Infectious Diseases Follow Up Note ? ?Patient Identification: ?Patient Name: [name] MRN: [id number] [place] Date: 07/18/2021  2:47 PM ?Age: 76 y.o.Today's Date: 07/24/2021 ? ?Reason for Visit: Bacteremia/leukocytosis ? ?Principal Problem: ?  Acute cholecystitis ?Active Problems: ?  Acquired hypothyroidism ?  GAD (generalized anxiety disorder) ?  Diet-controlled diabetes mellitus ([place]) ?  Acute metabolic encephalopathy ?  Hyponatremia ?  Cholecystitis ?  Sepsis ([place]) ?  Goals of care, counseling/discussion ? ?Antibiotics:  ?Vancomycin 5/3 ?Cefepime 5/3, 5/6-c ?Aztreonam 5/3, Ceftriaxone 5/4-5/6 ?Metronidazole 5/3-c ?  ?Lines/Hardware: Rt chest port, GI stent, RT UQ drain ? ?Interval Events: Remains afebrile, WBC went up to 20 yesterday.  CT abdomen pelvis with abnormal findings noted ? ?Assessment ?# Subcapsular fluid collection adjacent to the liver.  17.6 cm ?# Moderate-sized right pleural effusion with atelectasis in the right lower lobe ? IR has been consulted for drain placement ? 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  ?# Acute cholecystitis S/p cholecystostomy by IR 5/5. Cx with Morganella morganii ?  ?# leukocytosis - expect to downtrend with source control ?  ?# Pancreatic ca on chemotherapy ( last chemo gemcitabine/abraxane 4/24 and next due 5/9) ?  ?# DM ( a1c 6.3) ?  ?Recommendations ?Continue cefepime and metronidazole ?Follow-up CBC and pending cultures ?Follow-up IR procedure from today. Please send sample for cultures as appropriate. ?Following ? ?Rest of the management as per the primary team. ?Thank you for the consult. Please page with pertinent questions or concerns. ? ?______________________________________________________________________ ?Subjective ?patient seen and examined at the bedside.  Husband at bedside ?Mid abdominal pain with some nausea  ?Denies vomiting ?She is constipated ?20 mls from drain since 7 PM  last night to 7 AM this morning ? ?Vitals ?BP (!) 156/88 (BP Location: Left Arm)   Pulse 97   Temp 97.9 ?F (36.6 ?C) (Oral)   Resp 18   Ht '5\' 3"'$  (1.6 m)   Wt 78.4 kg   SpO2 97%   BMI 30.62 kg/m?  ? ?  ?Physical Exam ?Constitutional: Lying in the bed, looks tired ?   Comments:  ? ?Cardiovascular:  ?   Rate and Rhythm: Normal rate and regular rhythm.  ?   Heart sounds:  ? ?Pulmonary:  ?   Effort: Pulmonary effort is normal on room air  ?   Comments:  ? ?Abdominal:  ?   Palpations: Abdomen is soft.  ?   Tenderness: mid line tender, no RT, Rt UQ drain with minimal serosanguinous fluid in the bulb + ? ?Musculoskeletal:     ?   General: No swelling or tenderness.  ? ?Skin: ?   Comments:  ? ?Neurological:  ?   General: Grossly nonfocal, awake alert and oriented ? ?Psychiatric:     ?   Mood and Affect: Mood normal.  ? ?Pertinent Microbiology ?Results for orders placed or performed during the hospital encounter of 07/18/21  ?Resp Panel by RT-PCR (Flu A&B, Covid) Nasopharyngeal Swab     Status: Abnormal  ? Collection Time: 07/18/21  2:47 PM  ? Specimen: Nasopharyngeal Swab; Nasopharyngeal(NP) swabs in vial transport medium  ?Result Value Ref Range Status  ? SARS Coronavirus 2 by RT PCR POSITIVE (A) NEGATIVE Final  ?  Comment: (NOTE) ?SARS-CoV-2 target nucleic acids are DETECTED. ? ?The SARS-CoV-2 RNA is generally detectable in upper respiratory ?specimens during the acute phase of infection. Positive results are ?indicative of the presence of the identified virus, but do not rule ?out bacterial infection  or co-infection with other pathogens not ?detected by the test. Clinical correlation with patient history and ?other diagnostic information is necessary to determine patient ?infection status. The expected result is Negative. ? ?Fact Sheet for Patients: ?EntrepreneurPulse.com.au ? ?Fact Sheet for Healthcare Providers: ?IncredibleEmployment.be ? ?This test is not yet approved or  cleared by the [place] FDA and  ?has been authorized for detection and/or diagnosis of SARS-CoV-2 by ?FDA under an Emergency Use Authorization (EUA).  This EUA will ?remain in effect (meaning this test can be used) for the duration of  ?the COVID-19 declaration under Section 564(b)(1) of the A ct, 21 ?U.S.C. section 360bbb-3(b)(1), unless the authorization is ?terminated or revoked sooner. ? ?  ? Influenza A by PCR NEGATIVE NEGATIVE Final  ? Influenza B by PCR NEGATIVE NEGATIVE Final  ?  Comment: (NOTE) ?The Xpert Xpress SARS-CoV-2/FLU/RSV plus assay is intended as an aid ?in the diagnosis of influenza from Nasopharyngeal swab specimens and ?should not be used as a sole basis for treatment. Nasal washings and ?aspirates are unacceptable for Xpert Xpress SARS-CoV-2/FLU/RSV ?testing. ? ?Fact Sheet for Patients: ?EntrepreneurPulse.com.au ? ?Fact Sheet for Healthcare Providers: ?IncredibleEmployment.be ? ?This test is not yet approved or cleared by the [place] FDA and ?has been authorized for detection and/or diagnosis of SARS-CoV-2 by ?FDA under an Emergency Use Authorization (EUA). This EUA will remain ?in effect (meaning this test can be used) for the duration of the ?COVID-19 declaration under Section 564(b)(1) of the Act, 21 U.S.C. ?section 360bbb-3(b)(1), unless the authorization is terminated or ?revoked. ? ?Performed at [facility], [place] [address], [place], [place] ?[address] ?  ?Culture, blood (Routine x 2)     Status: Abnormal  ? Collection Time: 07/18/21  3:03 PM  ? Specimen: BLOOD LEFT HAND  ?Result Value Ref Range Status  ? Specimen Description BLOOD LEFT HAND  Final  ? Special Requests   Final  ?  BOTTLES DRAWN AEROBIC AND ANAEROBIC Blood Culture results may not be optimal due to an inadequate volume of blood received in culture bottles  ? Culture  Setup Time   Final  ?  GRAM NEGATIVE RODS ?ANAEROBIC BOTTLE ONLY ?CRITICAL VALUE NOTED.  VALUE IS  CONSISTENT WITH PREVIOUSLY REPORTED AND CALLED VALUE. ?IN BOTH AEROBIC AND ANAEROBIC BOTTLES ?  ? Culture (A)  Final  ?  MORGANELLA MORGANII ?CRITICAL RESULT CALLED TO, READ BACK BY AND VERIFIED WITH: PHARMD G.BARR AT 1221 ON 07/21/2021 BY T.SAAD. ?Performed at [facility], [place] [address], [place], [place] [address] ?  ? Report Status 07/23/2021 FINAL  Final  ? Organism ID, Bacteria MORGANELLA MORGANII  Final  ?    Susceptibility  ? Morganella morganii - MIC*  ?  AMPICILLIN >=32 RESISTANT Resistant   ?  CEFAZOLIN >=64 RESISTANT Resistant   ?  CEFTAZIDIME <=1 SENSITIVE Sensitive   ?  CIPROFLOXACIN <=0.25 SENSITIVE Sensitive   ?  GENTAMICIN <=1 SENSITIVE Sensitive   ?  IMIPENEM 8 INTERMEDIATE Intermediate   ?  TRIMETH/SULFA <=20 SENSITIVE Sensitive   ?  AMPICILLIN/SULBACTAM >=32 RESISTANT Resistant   ?  PIP/TAZO <=4 SENSITIVE Sensitive   ?  * MORGANELLA MORGANII  ?Culture, blood (Routine x 2)     Status: Abnormal  ? Collection Time: 07/18/21  3:07 PM  ? Specimen: BLOOD RIGHT FOREARM  ?Result Value Ref Range Status  ? Specimen Description BLOOD RIGHT FOREARM  Final  ? Special Requests   Final  ?  BOTTLES DRAWN AEROBIC AND ANAEROBIC  Blood Culture adequate volume  ? Culture  Setup Time   Final  ?  GRAM NEGATIVE RODS ?ANAEROBIC BOTTLE ONLY ?IN BOTH AEROBIC AND ANAEROBIC BOTTLES ?CRITICAL RESULT CALLED TO, READ BACK BY AND VERIFIED WITH: ?PHARMD GREG ABBOTT 07/19/21'@6'$ :10 BY TW ?  ? Culture (A)  Final  ?  ESCHERICHIA COLI ?MORGANELLA MORGANII ?SUSCEPTIBILITIES PERFORMED ON PREVIOUS CULTURE WITHIN THE LAST 5 DAYS. ?Performed at [facility], [place] [address] [place], [place] [address] ?  ? Report Status 07/23/2021 FINAL  Final  ? Organism ID, Bacteria ESCHERICHIA COLI  Final  ?    Susceptibility  ? Escherichia coli - MIC*  ?  AMPICILLIN 8 SENSITIVE Sensitive   ?  CEFAZOLIN <=4 SENSITIVE Sensitive   ?  CEFEPIME <=0.12 SENSITIVE Sensitive   ?  CEFTAZIDIME <=1 SENSITIVE Sensitive   ?  CEFTRIAXONE <=0.25 SENSITIVE  Sensitive   ?  CIPROFLOXACIN <=0.25 SENSITIVE Sensitive   ?  GENTAMICIN <=1 SENSITIVE Sensitive   ?  IMIPENEM <=0.25 SENSITIVE Sensitive   ?  TRIMETH/SULFA <=20 SENSITIVE Sensitive   ?  AMPICILLIN/SULBACTAM 4 SENSITIVE Sensitiv

## 2021-07-24 NOTE — Telephone Encounter (Signed)
Patient's daughter called to inquire about speaking with Dr. Benay Spice during the patient's current inpatient admission. Dr. Benay Spice states that he rounds on her daily and didn't have a chance to call the daughter yesterday. I called the daughter today to discuss the inpatient process in that the hospitalists are guiding her acute care and Dr. Benay Spice is following her as a Optometrist. The daughter has questions about her current treatment/findings and we asked her to discuss these with the hospitalist following the patient. We notified the daughter (Mrs. Jeneen Rinks) that he would be rounding tomorrow morning between 6-6:30 am again to check on her mom. We also discussed the plan of care guided in the inpatient is being led by  the hospitalist and will continue to discuss plan of care from her oncology perspective ongoing and during the transition from inpatient to outpatient.  ?

## 2021-07-24 NOTE — Progress Notes (Signed)
ANTICOAGULATION CONSULT NOTE - Initial Consult ? ?Pharmacy Consult for Heparin ?Indication: pulmonary embolus and DVT ? ?Allergies  ?Allergen Reactions  ? Penicillins Hives and Other (See Comments)  ?  Tolerated Ceftriaxone 2023 ? ?Has patient had a PCN reaction causing immediate rash, facial/tongue/throat swelling, SOB or lightheadedness with hypotension: No ?Has patient had a PCN reaction causing severe rash involving mucus membranes or skin necrosis: No ?Has patient had a PCN reaction that required hospitalization No ?Has patient had a PCN reaction occurring within the last 10 years: No ?If all of the above answers are "NO", then may proceed with Cephalosporin use.  ? ? ?Patient Measurements: ?Height: '5\' 3"'$  (160 cm) ?Weight: 78.4 kg (172 lb 13.5 oz) ?IBW/kg (Calculated) : 52.4 ?Heparin Dosing Weight: 70 kg ? ?Vital Signs: ?Temp: 98.1 ?F (36.7 ?C) (05/09 1633) ?Temp Source: Oral (05/09 1633) ?BP: 157/101 (05/09 1633) ?Pulse Rate: 104 (05/09 1633) ? ?Labs: ?Recent Labs  ?  07/22/21 ?2111 07/23/21 ?1252 07/24/21 ?5520 07/24/21 ?1306  ?HGB 11.1* 11.9* 11.1*  --   ?HCT 33.6* 35.7* 33.0*  --   ?PLT 245 316 267  --   ?LABPROT  --   --   --  15.3*  ?INR  --   --   --  1.2  ?CREATININE 0.63 0.65 0.67  --   ? ? ?Estimated Creatinine Clearance: 57.5 mL/min (by C-G formula based on SCr of 0.67 mg/dL). ? ? ?Medical History: ?Past Medical History:  ?Diagnosis Date  ? Anxiety   ? Arthritis   ? Cancer [facility])   ? pancreatic  ? Depression   ? Diabetes mellitus without complication ([place])   ? Hyperlipemia   ? Hypertension   ? Thyroid disease   ? TIA (transient ischemic attack)   ? Vitamin B12 deficiency 10/20/2019  ? ?Assessment: ? 76 yr old female with new incidental finding for small PE left lower lobe per CTA, and bilateral DVT per subsequent duplex.  Had been on heparin 5000 units SQ Q8h for VTE prophylaxis since admitted 07/18/21, stopped this am for with plan for drain placement in IR for new subcapsular fluid collection adjacent  to liver.  Acute cholecystitis and E coli and Morganella bacteremia. Percutaneous drain placed 07/20/21. Also s/p R thoracentesis today for R pleural effusion. ? ?  Per d/w Dr. Karleen Hampshire, 2nd drain placement to be done on 5/10;  Dr. Barbie Banner felt fluid collection did not represent hematoma. To begin IV heparin without bolus per Dr. Karleen Hampshire. ? ?Goal of Therapy:  ?Heparin level 0.3-0.7 units/ml ?Monitor platelets by anticoagulation protocol: Yes ?  ?Plan:  ?Discontinue SQ heparin. ?Begin heparin drip at 1100 units/hr. ?Heparin level ~8 hrs after drip begins. ?Daily heparin level and CBC while on heparin. ?Follow up for timing of holding heparin for drain placement on 5/10, then resuming post-procedure. ? ? ?Arty Baumgartner, RPh ?07/24/2021,5:12 PM ? ? ?

## 2021-07-24 NOTE — TOC Initial Note (Signed)
Transition of Care (TOC) - Initial/Assessment Note  ? ? ?Patient Details  ?Name: [name] ?MRN: [id number] ?Date of Birth: 09-Oct-1945 ? ?Transition of Care (TOC) CM/SW Contact:    ?Emeterio Reeve, LCSW ?Phone Number: ?07/24/2021, 9:06 PM ? ?Clinical Narrative:                 ? ?CSW received SNF consult. CSW met with pt and daughter [name]  at bedside. CSW introduced self and explained role at the hospital. Pt reports that PTA pt lived at home with her husband.  ? ?CSW reviewed PT/OT recommendations for SNF. [name] reports that they are unsure if they want SNF or HH. Pt gave CSW permission to fax out to facilities in the area. Pt has no preference of facility at this time. CSW gave pt medicare.gov rating list to review. CSW explained insurance auth process. ? ?CSW and NCM also spoke to daughter about home services and what they would cover. [name] stated that they are active with [facility] ? ?CSW will continue to follow. ? ? ?Expected Discharge Plan: [place] ?Barriers to Discharge: Continued Medical Work up, Ship broker ? ? ?Patient Goals and CMS Choice ?Patient states their goals for this hospitalization and ongoing recovery are:: rehab ?CMS Medicare.gov Compare Post Acute Care list provided to:: Patient ?Choice offered to / list presented to : Patient ? ?Expected Discharge Plan and Services ?Expected Discharge Plan: [place] ?  ?  ?  ?Living arrangements for the past 2 months: [place] ?                ?  ?  ?  ?  ?  ?  ?  ?  ?  ?  ? ?Prior Living Arrangements/Services ?Living arrangements for the past 2 months: [place] ?Lives with:: Spouse ?Patient language and need for interpreter reviewed:: Yes ?Do you feel safe going back to the place where you live?: Yes      ?Need for Family Participation in Patient Care: Yes (Comment) ?Care giver support system in place?: Yes (comment) ?  ?Criminal Activity/Legal Involvement Pertinent to Current  Situation/Hospitalization: No - Comment as needed ? ?Activities of Daily Living ?  ?  ? ?Permission Sought/Granted ?Permission sought to share information with : Family Supports, Customer service manager ?Permission granted to share information with : Yes, Verbal Permission Granted ? Share Information with NAME: daughter ? Permission granted to share info w AGENCY: SNF ?   ?   ? ?Emotional Assessment ?Appearance:: Appears stated age ?Attitude/Demeanor/Rapport: Engaged ?Affect (typically observed): Appropriate ?Orientation: : Oriented to Self, Oriented to Place, Oriented to  Time, Oriented to Situation ?Alcohol / Substance Use: Not Applicable ?Psych Involvement: No (comment) ? ?Admission diagnosis:  Acute cholecystitis [K81.0] ?Cholecystitis [K81.9] ?Sepsis, due to unspecified organism, unspecified whether acute organ dysfunction present ([place]) [A41.9] ?Patient Active Problem List  ? Diagnosis Date Noted  ? Sepsis ([place])   ? Goals of care, counseling/discussion   ? Cholecystitis 07/19/2021  ? Acute cholecystitis 07/18/2021  ? Acute metabolic encephalopathy 34/19/3790  ? Hyponatremia 07/18/2021  ? Elevated liver enzymes 06/30/2021  ? Pancreatic adenocarcinoma ([place]) 09/25/2020  ? Hyperbilirubinemia 09/24/2020  ? 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 Vitamin B12 deficiency 10/20/2019  ? GAD (generalized anxiety disorder) 10/20/2019  ? Diet-controlled diabetes mellitus ([place]) 10/20/2019  ? Osteoarthritis, multiple sites 10/20/2019  ? Presbycusis of both ears 04/13/2019  ? Essential tremor 04/13/2019  ? Bilateral primary  osteoarthritis of knee 09/23/2017  ? Acquired hypothyroidism 05/24/2012  ? Alcohol abuse, daily use 05/24/2012  ? Essential hypertension   ? ?PCP:  [name], MD ?Pharmacy:   ?[id number] [place], [place] - [address] ?[address] [place]?Phone: [phone]:  [phone]? ?[facility] [place]?Phone: [phone]: [phone]? ?[facility] ?[address]?Phone: [phone]: [phone] ? ? ? ? ?Social Determinants of Health (SDOH) Interventions ?  ? ?Readmission Risk Interventions ?   ? View : No data to display.  ?  ?  ?  ? ? ?MEmeterio Reeve LCSW ?Clinical Social Worker ? ?

## 2021-07-25 ENCOUNTER — Inpatient Hospital Stay (HOSPITAL_COMMUNITY): Payer: Medicare HMO

## 2021-07-25 DIAGNOSIS — K810 Acute cholecystitis: Secondary | ICD-10-CM | POA: Diagnosis not present

## 2021-07-25 LAB — CBC WITH DIFFERENTIAL/PLATELET
Abs Immature Granulocytes: 2.39 10*3/uL — ABNORMAL HIGH (ref 0.00–0.07)
Basophils Absolute: 0.2 10*3/uL — ABNORMAL HIGH (ref 0.0–0.1)
Basophils Relative: 1 %
Eosinophils Absolute: 0.1 10*3/uL (ref 0.0–0.5)
Eosinophils Relative: 1 %
HCT: 33.5 % — ABNORMAL LOW (ref 36.0–46.0)
Hemoglobin: 11 g/dL — ABNORMAL LOW (ref 12.0–15.0)
Immature Granulocytes: 11 %
Lymphocytes Relative: 12 %
Lymphs Abs: 2.6 10*3/uL (ref 0.7–4.0)
MCH: 30.6 pg (ref 26.0–34.0)
MCHC: 32.8 g/dL (ref 30.0–36.0)
MCV: 93.1 fL (ref 80.0–100.0)
Monocytes Absolute: 1.5 10*3/uL — ABNORMAL HIGH (ref 0.1–1.0)
Monocytes Relative: 7 %
Neutro Abs: 14.8 10*3/uL — ABNORMAL HIGH (ref 1.7–7.7)
Neutrophils Relative %: 68 %
Platelets: 286 10*3/uL (ref 150–400)
RBC: 3.6 MIL/uL — ABNORMAL LOW (ref 3.87–5.11)
RDW: 16 % — ABNORMAL HIGH (ref 11.5–15.5)
WBC: 21.5 10*3/uL — ABNORMAL HIGH (ref 4.0–10.5)
nRBC: 0.1 % (ref 0.0–0.2)

## 2021-07-25 LAB — GLUCOSE, CAPILLARY
Glucose-Capillary: 141 mg/dL — ABNORMAL HIGH (ref 70–99)
Glucose-Capillary: 165 mg/dL — ABNORMAL HIGH (ref 70–99)
Glucose-Capillary: 168 mg/dL — ABNORMAL HIGH (ref 70–99)
Glucose-Capillary: 187 mg/dL — ABNORMAL HIGH (ref 70–99)
Glucose-Capillary: 195 mg/dL — ABNORMAL HIGH (ref 70–99)

## 2021-07-25 LAB — COMPREHENSIVE METABOLIC PANEL
ALT: 14 U/L (ref 0–44)
AST: 26 U/L (ref 15–41)
Albumin: 1.7 g/dL — ABNORMAL LOW (ref 3.5–5.0)
Alkaline Phosphatase: 204 U/L — ABNORMAL HIGH (ref 38–126)
Anion gap: 7 (ref 5–15)
BUN: 9 mg/dL (ref 8–23)
CO2: 18 mmol/L — ABNORMAL LOW (ref 22–32)
Calcium: 7.7 mg/dL — ABNORMAL LOW (ref 8.9–10.3)
Chloride: 110 mmol/L (ref 98–111)
Creatinine, Ser: 0.69 mg/dL (ref 0.44–1.00)
GFR, Estimated: >60 mL/min (ref >60)
Glucose, Bld: 136 mg/dL — ABNORMAL HIGH (ref 70–99)
Potassium: 3.3 mmol/L — ABNORMAL LOW (ref 3.5–5.1)
Sodium: 135 mmol/L (ref 135–145)
Total Bilirubin: 0.9 mg/dL (ref 0.3–1.2)
Total Protein: 5 g/dL — ABNORMAL LOW (ref 6.5–8.1)

## 2021-07-25 LAB — PROCALCITONIN: Procalcitonin: 1.93 ng/mL

## 2021-07-25 LAB — CYTOLOGY - NON PAP

## 2021-07-25 LAB — LACTATE DEHYDROGENASE: LDH: 184 U/L (ref 98–192)

## 2021-07-25 LAB — HEPARIN LEVEL (UNFRACTIONATED)
Heparin Unfractionated: 0.47 IU/mL (ref 0.30–0.70)
Heparin Unfractionated: 0.1 IU/mL — ABNORMAL LOW (ref 0.30–0.70)

## 2021-07-25 MED ORDER — FENTANYL CITRATE (PF) 100 MCG/2ML IJ SOLN
INTRAMUSCULAR | Status: AC
  Filled 2021-07-25: qty 2

## 2021-07-25 MED ORDER — HEPARIN (PORCINE) 25000 UT/250ML-% IV SOLN
1600.0000 [IU]/h | INTRAVENOUS | Status: DC
Start: 2021-07-25 — End: 2021-07-31
  Administered 2021-07-26: 1350 [IU]/h via INTRAVENOUS
  Administered 2021-07-27 – 2021-07-30 (×5): 1600 [IU]/h via INTRAVENOUS
  Filled 2021-07-25 (×8): qty 250

## 2021-07-25 MED ORDER — MIDAZOLAM HCL 2 MG/2ML IJ SOLN
INTRAMUSCULAR | Status: AC
  Filled 2021-07-25: qty 2

## 2021-07-25 MED ORDER — FENTANYL CITRATE (PF) 100 MCG/2ML IJ SOLN
INTRAMUSCULAR | Status: AC | PRN
  Administered 2021-07-25 (×3): 25 ug via INTRAVENOUS

## 2021-07-25 MED ORDER — MIDAZOLAM HCL 2 MG/2ML IJ SOLN
INTRAMUSCULAR | Status: AC | PRN
  Administered 2021-07-25 (×2): .5 mg via INTRAVENOUS

## 2021-07-25 MED ORDER — LIDOCAINE HCL 1 % IJ SOLN
INTRAMUSCULAR | Status: AC
  Filled 2021-07-25: qty 10

## 2021-07-25 MED ORDER — SODIUM CHLORIDE 0.9% FLUSH
5.0000 mL | Freq: Three times a day (TID) | INTRAVENOUS | Status: DC
  Administered 2021-07-25 – 2021-08-01 (×20): 5 mL

## 2021-07-25 NOTE — Plan of Care (Signed)
?  Problem: Nutrition: ?Goal: Adequate nutrition will be maintained ?07/25/2021 2044 by Derrill Kay, RN ?Outcome: Progressing ?  ?Problem: Pain Managment: ?Goal: General experience of comfort will improve ?07/25/2021 2044 by Derrill Kay, RN ?Outcome: Progressing ?07/25/2021 2042 by Derrill Kay, RN ?Outcome: Progressing ?  ?Problem: Safety: ?Goal: Ability to remain free from injury will improve ?07/25/2021 2044 by Derrill Kay, RN ?Outcome: Progressing ?07/25/2021 2042 by Derrill Kay, RN ?Outcome: Progressing ?  ?

## 2021-07-25 NOTE — Progress Notes (Signed)
?PROGRESS NOTE ? ? ? ?[name]  TDD:[id number] DOB: [birth year] DOA: 07/18/2021 ?PCP: [name], MD  ? ? ?Brief Narrative:  ? ?[name] is a 76 year old female with past medical history significant for pancreatic cancer on chemotherapy, type 2 diabetes mellitus, hypothyroidism, GERD who presented to [facility] ED on 5/3 via EMS with confusion and fever.  Patient also has been complaining of generalized abdominal pain with intermittent nausea.  ? ?In the ED, sodium 129, potassium 3.6, chloride 102, CO2 16, glucose 191, BUN 11, creatinine 0.77.  Lactic acid 1.6.  WBC 7.7, hemoglobin 10.9.  TSH 2.251.  CT abdomen/pelvis with diffuse gallbladder wall thickening and edema with apparent area of discontinuity posterior aspect of the distal gallbladder wall with concern for gallbladder rupture or perforation, small ascites, sigmoid diverticulosis, ill-defined known pancreatic mass encasing the portal vein and portal splenic confluence with collateralization, several small bilateral pulmonary nodules, hypodense lesion inferior right lobe.  General surgery was consulted.  Hospital service consulted for further evaluation and management of altered mental status and concern for acute cholecystitis. ? ?Assessment & Plan: ?  ?Acute metabolic encephalopathy ?Etiology likely secondary to severe sepsis with bacteremia/UTI and acute cholecystitis.  Now resolved and appears to be at baseline. ?--Supportive care and treatment as below ? ?Severe sepsis, POA ?Morganella morganii/E. coli bacteremia ?E. coli/Enterococcus faecium UTI ?Acute cholecystitis s/p cholecystostomy ?Patient presenting to ED with confusion and found to have concerns for acute cholecystitis on CT abdomen/pelvis.  Initially started on empiric antibiotics with vancomycin, metronidazole, cefepime/aztreonam.  Evaluated by general surgery with recommendations of IR placement of cholecystostomy.  Underwent IR cholecystostomy placement on 07/20/2021 with culture  positive for E. coli, Morganella morganii, Bacteroides ovatus. Repeat CT abdomen/pelvis notable for new subcapsular fluid collection near the liver spanning 17 cm. ?--WBC 7.7>>24.7>20.2>21.5 ?--Drain culture 5/10: Pending ?--Cefepime 2 g IV every 12 hours ?--Metronidazole 5 mg PO q12h ?--CBC daily ?--Continue monitor drain output ?--Await further recommendations per ID ? ?Left lower lobe pulmonary embolism ?CTA chest 5/9 with bilateral segmental and subsegmental pulmonary emboli.  Vascular duplex ultrasound bilateral lower extremities 5/9 with findings consistent with acute DVT right common femoral vein, acute DVT left common femoral vein and left proximal profunda vein; common femoral vein obstruction does not appear to extend above inguinal ligament. ?--Continue heparin drip, anticipate transition to apixaban on discharge ? ?Right pleural effusion ?Underwent IR thoracentesis on 07/24/2021 with removal of 350 mL clear yellow fluid. ?--Pleural fluid culture: No growth less than 24 hours ?--Pleural fluid cytology: Pending ? ?Type 2 diabetes mellitus ?Hemoglobin A1c 6.3 on 07/10/2021, well controlled. ?--SSI for coverage ?--CBGs qAC/HS ? ?Pancreatic cancer ?Follows with medical oncology outpatient, Dr. Benay Spice. ? ?Hypothyroidism ?--Levothyroxine 100 mcg p.o. daily ? ?Generalized anxiety disorder ?-- Alprazolam 0.25 PO daily PRN ? ?Essential hypertension ?--Amlodipine 5 mg p.o. daily ? ?Hypomagnesemia ?Repleted. ?--Magnesium level in a.m. ? ?Hypokalemia ?Potassium 3.3 this morning, will replete. ?--Repeat BMP in a.m. to include magnesium ? ?Hx COVID-19 viral infection 06/20/2021 ?Oxygenating well on room air. ? ?Ethics: ?Given patient's multiple comorbidities, debility and clinical decline, palliative care was consulted for assistance with goals of care and medical decision making.  Seen by palliative care on 5/9 and recommendations are to continue with full code/full scope of treatment.  Outpatient palliative care to  follow-up at cancer center arranged following discharge. ? ?Physical deconditioning: ?--Continue PT/OT efforts while inpatient ?--Pending SNF placement ? ? ?DVT prophylaxis: SCDs Start: 07/18/21 1925 ? ?  Code Status: Full Code ?  Family Communication: Updated family present at bedside this morning ? ?Disposition Plan:  ?Level of care: Med-Surg ?Status is: Inpatient ?Remains inpatient appropriate because: IR drain placement planned for today, awaiting further recommendations regarding antibiotic treatment/duration per infectious disease, anticipate discharge to SNF 2-3 days ?  ? ?Consultants:  ?General surgery, signed off 5/6 ?Infectious disease ?Palliative care ?Interventional radiology ? ?Procedures:  ?IR cholecystostomy placement ?IR drain placement 5/10 ? ?Antimicrobials:  ?Vancomycin 5/3 - 5/3 ?Aztreonam 5/3 - 5/3 ?Ceftriaxone 5/4 - 5/6 ?Cefepime 5/3, 5/6>> ?Metronidazole 5/3>> ? ? ?Subjective: ?Patient seen examined bedside, resting currently.  Just returned from interventional radiology following drain placement.  Asking for something to eat.  Pain controlled.  Family present at bedside and updated.  No other specific questions or concerns at this time.  Denies headache, no chest pain, no palpitations, no shortness of breath, no nausea/vomiting/diarrhea, no fever/chills/night sweats, no paresthesias, no fatigue.  No acute concerns overnight per nursing staff. ? ?Objective: ?Vitals:  ? 07/25/21 0910 07/25/21 0915 07/25/21 0920 07/25/21 0930  ?BP: (!) 154/99 (!) 164/100 (!) 160/105 (!) 164/96  ?Pulse: 95 96 94 91  ?Resp: 12 (!) 22 (!) 22 15  ?Temp:      ?TempSrc:      ?SpO2: 95% 97% 97% 97%  ?Weight:      ?Height:      ? ? ?Intake/Output Summary (Last 24 hours) at 07/25/2021 1206 ?Last data filed at 07/25/2021 1117 ?Gross per 24 hour  ?Intake 400.12 ml  ?Output 480 ml  ?Net -79.88 ml  ? ?Filed Weights  ? 07/18/21 1511 07/21/21 0500 07/24/21 0601  ?Weight: 67.1 kg 67 kg 78.4 kg  ? ? ?Examination: ? ?Physical  Exam: ?GEN: NAD, alert and oriented x 3, chronically ill in appearance ?HEENT: NCAT, PERRL, EOMI, sclera clear, MMM ?PULM: CTAB w/o wheezes/crackles, normal respiratory effort, on room air ?CV: RRR w/o M/G/R ?GI: abd soft, NTND, NABS, no R/G/M, abdominal drains x2 noted ?MSK: no peripheral edema, muscle strength globally intact 5/5 bilateral upper/lower extremities ?NEURO: CN II-XII intact, no focal deficits, sensation to light touch intact ?PSYCH: Depressed mood, flat affect ?Integumentary: dry/intact, no rashes or wounds ? ? ? ?Data Reviewed: I have personally reviewed following labs and imaging studies ? ?CBC: ?Recent Labs  ?Lab 07/21/21 ?0233 07/22/21 ?7616 07/23/21 ?1252 07/24/21 ?0737 07/25/21 ?0215  ?WBC 11.7* 18.5* 24.7* 20.2* 21.5*  ?NEUTROABS 9.7* 13.9* 18.1* 14.5* 14.8*  ?HGB 10.2* 11.1* 11.9* 11.1* 11.0*  ?HCT 31.4* 33.6* 35.7* 33.0* 33.5*  ?MCV 92.4 92.1 93.2 91.9 93.1  ?PLT 188 245 316 267 286  ? ?Basic Metabolic Panel: ?Recent Labs  ?Lab 07/18/21 ?1948 07/19/21 ?1062 07/19/21 ?6948 07/20/21 ?0158 07/21/21 ?5462 07/22/21 ?7035 07/23/21 ?1252 07/24/21 ?0093 07/25/21 ?0215  ?NA  --  130*   < > 132* 131* 134* 134* 135 135  ?K  --  3.8   < > 3.9 3.4* 3.8 3.5 3.5 3.3*  ?CL  --  103  --  103 105 109 109 110 110  ?CO2  --  18*  --  18* 17* 17* 15* 18* 18*  ?GLUCOSE  --  178*  --  123* 140* 119* 196* 151* 136*  ?BUN  --  9  --  '13 12 11 12 9 9  '$ ?CREATININE  --  0.58  --  0.89 0.70 0.63 0.65 0.67 0.69  ?CALCIUM  --  8.3*  --  7.9* 7.8* 8.0* 7.7* 7.7* 7.7*  ?MG 1.4* 1.5*  --  2.1  --   --   --   --   --   ? < > =  values in this interval not displayed.  ? ?GFR: ?Estimated Creatinine Clearance: 57.5 mL/min (by C-G formula based on SCr of 0.69 mg/dL). ?Liver Function Tests: ?Recent Labs  ?Lab 07/20/21 ?0158 07/21/21 ?0233 07/22/21 ?1583 07/24/21 ?0940 07/25/21 ?0215  ?AST 17 12* '20 21 26  '$ ?ALT '15 12 14 13 14  '$ ?ALKPHOS 73 73 138* 183* 204*  ?BILITOT 0.7 0.7 0.7 0.9 0.9  ?PROT 4.9* 4.8* 4.8* 4.9* 5.0*  ?ALBUMIN 1.9*  1.8* 1.7* 1.7* 1.7*  ? ?No results for input(s): LIPASE, AMYLASE in the last 168 hours. ?No results for input(s): AMMONIA in the last 168 hours. ?Coagulation Profile: ?Recent Labs  ?Lab 07/18/21 ?1503 07/19/21 ?

## 2021-07-25 NOTE — Procedures (Signed)
Interventional Radiology Procedure Note ? ?Procedure: CT Guided Drainage of perihepatic fluid collection ? ?Complications: None ? ?Estimated Blood Loss: < 10 mL ? ?Findings: ?12 Fr drain placed in perihepatic fluid with return of thin, yellow fluid. Fluid sample sent for culture analysis. Drain attached to suction bulb drainage. ? ?Will follow. ? ?Eulas Post T. Kathlene Cote, M.D ?Pager:  [phone] ? ?  ?

## 2021-07-25 NOTE — TOC Progression Note (Signed)
Transition of Care (TOC) - Progression Note  ? ? ?Patient Details  ?Name: [name] ?MRN: [id number] ?Date of Birth: [birth year] ? ?Transition of Care (TOC) CM/SW Contact  ?Emeterio Reeve, LCSW ?Phone Number: ?07/25/2021, 3:33 PM ? ?Clinical Narrative:    ? ?CSW spoke with pt and husband at bedside and [name] on phone. [name] stated they are interested in SNF. [name] wants to tour the two facilities today. CSW encourged Mellia to view today because pt is nearing DC.  ? ?Expected Discharge Plan: [place] ?Barriers to Discharge: Continued Medical Work up, Ship broker ? ?Expected Discharge Plan and Services ?Expected Discharge Plan: [place] ?  ?  ?  ?Living arrangements for the past 2 months: [place] ?                ?  ?  ?  ?  ?  ?  ?  ?  ?  ?  ? ? ?Social Determinants of Health (SDOH) Interventions ?  ? ?Readmission Risk Interventions ?   ? View : No data to display.  ?  ?  ?  ? ?Emeterio Reeve, LCSW ?Clinical Social Worker ? ?

## 2021-07-25 NOTE — Plan of Care (Signed)
  Problem: Nutrition: Goal: Adequate nutrition will be maintained Outcome: Progressing   Problem: Pain Managment: Goal: General experience of comfort will improve Outcome: Progressing   Problem: Safety: Goal: Ability to remain free from injury will improve Outcome: Progressing   

## 2021-07-25 NOTE — Progress Notes (Signed)
ANTICOAGULATION CONSULT NOTE  ?Pharmacy Consult for Heparin ?Indication: pulmonary embolus and DVT ?Brief A/P: Heparin level within goal range Continue Heparin at current rate  ? ?Allergies  ?Allergen Reactions  ? Penicillins Hives and Other (See Comments)  ?  Tolerated Ceftriaxone 2023 ? ?Has patient had a PCN reaction causing immediate rash, facial/tongue/throat swelling, SOB or lightheadedness with hypotension: No ?Has patient had a PCN reaction causing severe rash involving mucus membranes or skin necrosis: No ?Has patient had a PCN reaction that required hospitalization No ?Has patient had a PCN reaction occurring within the last 10 years: No ?If all of the above answers are "NO", then may proceed with Cephalosporin use.  ? ? ?Patient Measurements: ?Height: '5\' 3"'$  (160 cm) ?Weight: 78.4 kg (172 lb 13.5 oz) ?IBW/kg (Calculated) : 52.4 ?Heparin Dosing Weight: 70 kg ? ?Vital Signs: ?Temp: 98.2 ?F (36.8 ?C) (05/09 2053) ?Temp Source: Oral (05/09 2053) ?BP: 154/102 (05/09 2053) ?Pulse Rate: 108 (05/09 2053) ? ?Labs: ?Recent Labs  ?  07/23/21 ?1252 07/24/21 ?0433 07/24/21 ?1306 07/25/21 ?0215  ?HGB 11.9* 11.1*  --  11.0*  ?HCT 35.7* 33.0*  --  33.5*  ?PLT 316 267  --  286  ?LABPROT  --   --  15.3*  --   ?INR  --   --  1.2  --   ?HEPARINUNFRC  --   --   --  0.47  ?CREATININE 0.65 0.67  --  0.69  ? ? ? ?Estimated Creatinine Clearance: 57.5 mL/min (by C-G formula based on SCr of 0.69 mg/dL). ? ?Assessment: ? 76 yr old female with new PE/DVT for heparin  ? ?Goal of Therapy:  ?Heparin level 0.3-0.7 units/ml ?Monitor platelets by anticoagulation protocol: Yes ?  ?Plan:  ?Continue Heparin at current rate  ? ?Phillis Knack, PharmD, BCPS  ?07/25/2021,3:07 AM ? ? ?

## 2021-07-25 NOTE — Progress Notes (Signed)
HEMATOLOGY-ONCOLOGY PROGRESS NOTE ? ?ASSESSMENT AND PLAN: ?Adenocarcinoma the pancreas body, stage IV (cT4,cN0,pM1) ?Ultrasound abdomen 08/18/2020-possible hypoechoic pancreas body mass, small hypoechoic liver areas ?MRI abdomen 08/27/2020-pancreas body mass, multiple hepatic lesions consistent with metastases, right abdominal omental nodularity, pancreas mass extends to the celiac bifurcation and abuts the splenic vein and splenoportal confluence ?CT chest 09/07/2020-scattered pulmonary nodules concerning for metastases, pancreas body mass, hepatic metastases ?Ultrasound-guided biopsy of a right liver lesion 09/08/2020-adenocarcinoma consistent with a pancreas primary ?CT abdomen/pelvis 09/24/2020-pancreas neck mass, omental and peritoneal nodularity-unchanged, multiple hypoenhancing liver masses-unchanged, numerous small pulmonary nodules ?Cycle 1 gemcitabine/Abraxane 10/04/2020 ?Cycle 2 gemcitabine/Abraxane 10/27/2020 ?Cycle 3 gemcitabine/Abraxane 11/11/2018 ?Cycle 4 gemcitabine/Abraxane 11/23/2020 ?Cycle 5 gemcitabine/Abraxane 12/08/2020 ?CT abdomen/pelvis 12/18/2020-stable pancreas mass, stable and improved liver lesions, resolution of omental soft tissue density, stable indeterminate lung nodules, no disease progression ?Cycle 6 gemcitabine/Abraxane 12/22/2020 ?Cycle 7 gemcitabine 01/05/2021, 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  ?Cycle 8 gemcitabine/Abraxane 01/19/2021 ?Cycle 9 gemcitabine/Abraxane 02/01/2021 ?Cycle 10 Gemcitabine 02/16/2021, Abraxane held due to increased neuropathy ?Cycle 11 Gemcitabine 03/05/2021, 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 ?Cycle 12 gemcitabine 03/14/2021, 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 ?Cycle 13 gemcitabine 04/02/2021, 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 ?Cycle 14 gemcitabine/Abraxane 04/16/2021, Abraxane resumed at a reduced dose ?Cycle 15 gemcitabine/Abraxane 04/30/2021 ?CT abdomen/pelvis 05/08/2021-stable pancreas mass, liver metastases are slightly smaller, stable tiny bilateral lower lung nodules ?Cycle  16 gemcitabine/Abraxane 05/14/2021, Abraxane escalated back to 100 mg per metered squared ?Cycle 17 gemcitabine/Abraxane 05/28/2021 ?Cycle 18 gemcitabine/Abraxane 06/11/2021 ?Cycle 19 gemcitabine/Abraxane 07/09/2021 ?CT Abdo/pelvis 07/18/2021-diffuse gallbladder wall thickening and edema, small volume ascites, stable pulmonary nodules, indeterminate 15 x 18 mm right liver lesion, ill-defined hypodensity in the body of the pancreas ?  ?2.  Pain secondary #1 ?3.  Diabetes ?4.  Hypertension ?5.  Osteoarthritis ?6.  Port-A-Cath placement 09/22/2020 ?7.  Admission with jaundice 09/24/2020.  MRI-new abrupt constriction of the common hepatic duct.  The infiltrative pancreatic mass extends medially in the porta hepatis to obstruct the common hepatic duct.  Multiple right hepatic lobe metastases mildly increased in size.  Stent placed into the common bile duct 09/28/2020. ?8.  COVID-19 06/20/2021, admission with COVID and bacterial pneumonia?  06/30/2021-completed course of antibiotics ?9.  Hospital admission 07/18/2021-bacteremia, UTI, acute cholecystitis ?CT abdomen/pelvis 07/24/2021-moderate right pleural effusion, new subcapsular fluid collections adjacent to the right liver, interval cholecystostomy tube, stable pancreas body mass, incidental pulmonary embolism and a left lower lobe pulmonary artery ?10.  Pulmonary embolism/bilateral DVTs ?CT abdomen/pelvis 07/24/2021-incidental left lower lobe pulmonary embolism ?CT chest 07/24/2021-segmental and subsegmental bilateral pulmonary emboli ?Dopplers 07/24/2021-DVTs in the right common femoral, left common femoral, left profunda veins ?Heparin 07/24/2021 ? ?Ms. Heyboer appears stable.  I discussed the recent CT findings including the DVTs/pulmonary emboli with Ms. Jenning and her daughter.  She has a high risk for venous thromboembolic disease with the pancreas cancer diagnosis, recent COVID-19 infection, and immobility.  I agree with the plan for heparin anticoagulation to be followed by  apixaban. ?There is no clear evidence for progression of the pancreas cancer.  The pleural and perihepatic effusions are likely inflammatory.  We will follow-up on culture and cytology results. ? ?The plan is to resume gemcitabine/Abraxane chemotherapy when she has recovered from the cholecystitis. ? ?Recommendations: ?1.  Continue heparin anticoagulation, convert to apixaban at discharge ?2.  Management of the cholecystitis/perihepatic/pleural fluid collections per the medical service and interventional radiology ?3.  Follow-up on the pleural fluid and abdominal fluid cytology ?4.  Outpatient follow-up will be scheduled at the Cancer center. ? ?  SUBJECTIVE: [name] continues to have pain in the right upper abdomen.  She underwent a right thoracentesis for 350 cc of fluid yesterday.  She was started on heparin anticoagulation after a chest CT confirmed pulmonary embolism and lower extremity Dopplers revealed bilateral DVTs.  She is scheduled for drainage of a perihepatic fluid collection today. ? ?Her daughter was at the bedside when I saw her this morning. ? ?Oncology History Overview Note  ?Cancer Staging ?Primary pancreatic cancer with metastasis to other site [facility]) ?Staging form: Exocrine Pancreas, AJCC 8th Edition ?- Clinical stage from 09/08/2020: Stage IV (cT4, cN0, pM1) - Signed by Truitt Merle, MD on 09/12/2020 ?Stage prefix: Initial diagnosis ?Total positive nodes: 0 ? ?  ?Primary pancreatic cancer with metastasis to other site [facility])  ?08/27/2020 Imaging  ? Abdominal MRI w wo contrast: ?IMPRESSION: ?1. Pancreatic body infiltrative mass, favoring adenocarcinoma. ?2. Multiple hepatic lesions, most consistent with metastasis. ?3. Right abdominal omental nodularity, highly suspicious for ?peritoneal metastasis. ?4. Recommend multidisciplinary oncology consultation for eventual ?sampling of either the liver or pancreatic lesions. ?5. Vascular involvement with tumor, as detailed above. ?  ?09/07/2020 Imaging  ? CT  chest  ?IMPRESSION: ?1. Scattered small pulmonary nodules, worrisome for metastatic ?disease. ?2. Pancreatic body mass and hepatic metastases, better seen and ?described on MR abdomen 08/27/2020. ?3. Aortic atherosclerosis (ICD10-I70.0). Coronary artery ?calcification. ?  ?09/08/2020 Cancer Staging  ? Staging form: Exocrine Pancreas, AJCC 8th Edition ?- Clinical stage from 09/08/2020: Stage IV (cT4, cN0, pM1) - Signed by Truitt Merle, MD on 09/12/2020 ?Stage prefix: Initial diagnosis ?Total positive nodes: 0 ?  ?09/08/2020 Pathology Results  ? A. LIVER, RIGHT, BIOPSY:  ?- Adenocarcinoma.  See comment.  ? ?COMMENT:  ? ?The morphology is consistent with a pancreatic primary.  ?  ?09/12/2020 Initial Diagnosis  ? Primary pancreatic cancer with metastasis to other site [facility]) ?  ?10/12/2020 -  Chemotherapy  ? Patient is on Treatment Plan : PANCREATIC Abraxane / Gemcitabine D1,8,15 q28d  ?   ? ?PHYSICAL EXAMINATION: ? ?Vitals:  ? 07/25/21 0920 07/25/21 0930  ?BP: (!) 160/105 (!) 164/96  ?Pulse: 94 91  ?Resp: (!) 22 15  ?Temp:    ?SpO2: 97% 97%  ? ?Filed Weights  ? 07/18/21 1511 07/21/21 0500 07/24/21 0601  ?Weight: 148 lb (67.1 kg) 147 lb 11.3 oz (67 kg) 172 lb 13.5 oz (78.4 kg)  ? ? ?Intake/Output from previous day: ?05/09 0701 - 05/10 0700 ?In: 400.1 [I.V.:100.1; IV Piggyback:300] ?Out: 380 [Drains:380] ? ?Physical Exam ?Vitals reviewed.  ?Constitutional:   ?   General: She is not in acute distress. ?HENT:  ?   Head: Normocephalic.  ?Eyes:  ?   General: No scleral icterus. ?   Conjunctiva/sclera: Conjunctivae normal.  ?Cardiovascular:  ?   Rate and Rhythm: Normal rate.  ?Pulmonary:  ?   Effort: Pulmonary effort is normal. No respiratory distress.  ?Abdominal:  ?   Palpations: Abdomen is soft.  ?   Comments: Percutaneous chole drain in place.   ?Skin: ?   General: Skin is warm and dry.  ?Neurological:  ?   Mental Status: She is alert and oriented to person, place, and time.  ? ? ?LABORATORY DATA:  ?I have reviewed the data as  listed ? ?  Latest Ref Rng & Units 07/25/2021  ?  2:15 AM 07/24/2021  ?  4:33 AM 07/23/2021  ? 12:52 PM  ?CMP  ?Glucose 70 - 99 mg/dL 136   151   196    ?  BUN 8 - 23 mg/dL '9   9   12    '$ ?Creatinine 0.44 - 1.00 mg/dL 0.69

## 2021-07-25 NOTE — Progress Notes (Signed)
ANTICOAGULATION CONSULT NOTE  ?Pharmacy Consult for Heparin ?Indication: pulmonary embolus and DVT ?Brief A/P: Heparin level within goal range Continue Heparin at current rate  ? ?Allergies  ?Allergen Reactions  ? Penicillins Hives and Other (See Comments)  ?  Tolerated Ceftriaxone 2023 ? ?Has patient had a PCN reaction causing immediate rash, facial/tongue/throat swelling, SOB or lightheadedness with hypotension: No ?Has patient had a PCN reaction causing severe rash involving mucus membranes or skin necrosis: No ?Has patient had a PCN reaction that required hospitalization No ?Has patient had a PCN reaction occurring within the last 10 years: No ?If all of the above answers are "NO", then may proceed with Cephalosporin use.  ? ? ?Patient Measurements: ?Height: '5\' 3"'$  (160 cm) ?Weight: 78.4 kg (172 lb 13.5 oz) ?IBW/kg (Calculated) : 52.4 ?Heparin Dosing Weight: 70 kg ? ?Vital Signs: ?Temp: 98.6 ?F (53 ?C) (05/10 2013) ?Temp Source: Oral (05/10 2013) ?BP: 142/89 (05/10 2013) ?Pulse Rate: 93 (05/10 2013) ? ?Labs: ?Recent Labs  ?  07/23/21 ?1252 07/24/21 ?0433 07/24/21 ?1306 07/25/21 ?0215 07/25/21 ?1809  ?HGB 11.9* 11.1*  --  11.0*  --   ?HCT 35.7* 33.0*  --  33.5*  --   ?PLT 316 267  --  286  --   ?LABPROT  --   --  15.3*  --   --   ?INR  --   --  1.2  --   --   ?HEPARINUNFRC  --   --   --  0.47 0.10*  ?CREATININE 0.65 0.67  --  0.69  --   ? ? ? ?Estimated Creatinine Clearance: 57.5 mL/min (by C-G formula based on SCr of 0.69 mg/dL). ? ?Assessment: ? 76 yr old female with new PE/DVT for heparin. ? ?s/p CT guided catheter drainage of perihepatic abscess 07/25/21 AM. ?>Per RN 5/10 the heparin drip was not held that she knows of for IR and was running at 1100 units/hr on return to [place] post procedure.  ?Heparin continued post IR - ok per Dr. Kathlene Cote.  No bleeding noted per RN.  ?Heparin continued at 1100 units/hr. ? ?PM Heparin level decreased to 0.1 on heparin 1100 units/hr.   ?RN reported that heparin drip was moved  from central line port to a peripheral IV site ~ 14:55 today.  Uncertain if heparin was off for a significant period of time.  However, heparin level has decreased and is currently sub-therapeutic.  No bleeding noted.  Will increase heparin rate to target goal HL 0.3-0.7 units/ml for new PE/DVT.  ? ? ?Goal of Therapy:  ?Heparin level 0.3-0.7 units/ml ?Monitor platelets by anticoagulation protocol: Yes ?  ?Plan:  ?Increase Heparin to 1250 units/hr  ?Recheck Heparin level 8 hours after rate increased. ?Daily HL, CBC while on heparin.  ? ?Nicole Cella, RPh ?Clinical Pharmacist ?Please refer to [facility] for [place] numbers ?07/25/2021,8:29 PM ? ? ?

## 2021-07-25 NOTE — Progress Notes (Signed)
ANTICOAGULATION CONSULT NOTE  ?Pharmacy Consult for Heparin ?Indication: pulmonary embolus and DVT ?Brief A/P: Heparin level within goal range Continue Heparin at current rate  ? ?Allergies  ?Allergen Reactions  ? Penicillins Hives and Other (See Comments)  ?  Tolerated Ceftriaxone 2023 ? ?Has patient had a PCN reaction causing immediate rash, facial/tongue/throat swelling, SOB or lightheadedness with hypotension: No ?Has patient had a PCN reaction causing severe rash involving mucus membranes or skin necrosis: No ?Has patient had a PCN reaction that required hospitalization No ?Has patient had a PCN reaction occurring within the last 10 years: No ?If all of the above answers are "NO", then may proceed with Cephalosporin use.  ? ? ?Patient Measurements: ?Height: '5\' 3"'$  (160 cm) ?Weight: 78.4 kg (172 lb 13.5 oz) ?IBW/kg (Calculated) : 52.4 ?Heparin Dosing Weight: 70 kg ? ?Vital Signs: ?Temp: 98.2 ?F (36.8 ?C) (05/10 0300) ?Temp Source: Oral (05/10 0300) ?BP: 164/96 (05/10 0930) ?Pulse Rate: 91 (05/10 0930) ? ?Labs: ?Recent Labs  ?  07/23/21 ?1252 07/24/21 ?0433 07/24/21 ?1306 07/25/21 ?0215  ?HGB 11.9* 11.1*  --  11.0*  ?HCT 35.7* 33.0*  --  33.5*  ?PLT 316 267  --  286  ?LABPROT  --   --  15.3*  --   ?INR  --   --  1.2  --   ?HEPARINUNFRC  --   --   --  0.47  ?CREATININE 0.65 0.67  --  0.69  ? ? ?Estimated Creatinine Clearance: 57.5 mL/min (by C-G formula based on SCr of 0.69 mg/dL). ? ?Assessment: ? 76 yr old female with new PE/DVT for heparin. ? ?Heparin continued post IR - ok per Dr. Kathlene Cote.  ?No bleeding noted per RN.  ? ?Goal of Therapy:  ?Heparin level 0.3-0.7 units/ml ?Monitor platelets by anticoagulation protocol: Yes ?  ?Plan:  ?Resume Heparin at 1100 units/hr  ?Recheck Heparin level this PM ? ?Sloan Leiter, PharmD, BCPS, BCCCP ?Clinical Pharmacist ?Please refer to [facility] for [place] numbers ?07/25/2021,1:36 PM ? ? ?

## 2021-07-25 NOTE — Progress Notes (Signed)
A consult was placed to IV Therapy to restart the PIV for the pt;  pt is not in her room at 0930;  RN to place new consult to IV Therapy when pt is back from procedure. ?

## 2021-07-25 NOTE — Progress Notes (Signed)
PT Cancellation Note ? ?Patient Details ?Name: [name] ?MRN: [id number] ?DOB: 1945/05/13 ? ? ?Cancelled Treatment:    Reason Eval/Treat Not Completed: Patient declined, no reason specified Pt declined working with therapy at this time due to just getting back from her procedure and wanting to eat, wanting her drain emptied and inquiring about an IV antibiotic. Will follow. ? ? ?[place] ?07/25/2021, 11:16 AM ?Marisa Severin, PT, DPT ?Acute Rehabilitation Services ?Secure chat preferred ?Office [phone] ? ? ? ?

## 2021-07-26 DIAGNOSIS — K810 Acute cholecystitis: Secondary | ICD-10-CM | POA: Diagnosis not present

## 2021-07-26 LAB — CBC WITH DIFFERENTIAL/PLATELET
Abs Immature Granulocytes: 0 10*3/uL (ref 0.00–0.07)
Basophils Absolute: 0 10*3/uL (ref 0.0–0.1)
Basophils Relative: 0 %
Eosinophils Absolute: 0.2 10*3/uL (ref 0.0–0.5)
Eosinophils Relative: 1 %
HCT: 32.6 % — ABNORMAL LOW (ref 36.0–46.0)
Hemoglobin: 10.6 g/dL — ABNORMAL LOW (ref 12.0–15.0)
Lymphocytes Relative: 4 %
Lymphs Abs: 0.7 10*3/uL (ref 0.7–4.0)
MCH: 30.1 pg (ref 26.0–34.0)
MCHC: 32.5 g/dL (ref 30.0–36.0)
MCV: 92.6 fL (ref 80.0–100.0)
Monocytes Absolute: 0.5 10*3/uL (ref 0.1–1.0)
Monocytes Relative: 3 %
Neutro Abs: 15.8 10*3/uL — ABNORMAL HIGH (ref 1.7–7.7)
Neutrophils Relative %: 92 %
Platelets: 232 10*3/uL (ref 150–400)
RBC: 3.52 MIL/uL — ABNORMAL LOW (ref 3.87–5.11)
RDW: 16.4 % — ABNORMAL HIGH (ref 11.5–15.5)
WBC: 17.2 10*3/uL — ABNORMAL HIGH (ref 4.0–10.5)
nRBC: 0 % (ref 0.0–0.2)
nRBC: 1 /100 WBC — ABNORMAL HIGH

## 2021-07-26 LAB — GLUCOSE, CAPILLARY
Glucose-Capillary: 137 mg/dL — ABNORMAL HIGH (ref 70–99)
Glucose-Capillary: 147 mg/dL — ABNORMAL HIGH (ref 70–99)
Glucose-Capillary: 156 mg/dL — ABNORMAL HIGH (ref 70–99)
Glucose-Capillary: 162 mg/dL — ABNORMAL HIGH (ref 70–99)
Glucose-Capillary: 171 mg/dL — ABNORMAL HIGH (ref 70–99)

## 2021-07-26 LAB — C-REACTIVE PROTEIN: CRP: 4.1 mg/dL — ABNORMAL HIGH (ref <1.0)

## 2021-07-26 LAB — COMPREHENSIVE METABOLIC PANEL
ALT: 12 U/L (ref 0–44)
AST: 28 U/L (ref 15–41)
Albumin: 1.6 g/dL — ABNORMAL LOW (ref 3.5–5.0)
Alkaline Phosphatase: 188 U/L — ABNORMAL HIGH (ref 38–126)
Anion gap: 4 — ABNORMAL LOW (ref 5–15)
BUN: 8 mg/dL (ref 8–23)
CO2: 22 mmol/L (ref 22–32)
Calcium: 7.4 mg/dL — ABNORMAL LOW (ref 8.9–10.3)
Chloride: 108 mmol/L (ref 98–111)
Creatinine, Ser: 0.61 mg/dL (ref 0.44–1.00)
GFR, Estimated: >60 mL/min (ref >60)
Glucose, Bld: 152 mg/dL — ABNORMAL HIGH (ref 70–99)
Potassium: 3.2 mmol/L — ABNORMAL LOW (ref 3.5–5.1)
Sodium: 134 mmol/L — ABNORMAL LOW (ref 135–145)
Total Bilirubin: 0.8 mg/dL (ref 0.3–1.2)
Total Protein: 4.7 g/dL — ABNORMAL LOW (ref 6.5–8.1)

## 2021-07-26 LAB — HEPARIN LEVEL (UNFRACTIONATED)
Heparin Unfractionated: 0.23 IU/mL — ABNORMAL LOW (ref 0.30–0.70)
Heparin Unfractionated: 0.27 IU/mL — ABNORMAL LOW (ref 0.30–0.70)

## 2021-07-26 LAB — PATHOLOGIST SMEAR REVIEW

## 2021-07-26 LAB — MAGNESIUM: Magnesium: 1.9 mg/dL (ref 1.7–2.4)

## 2021-07-26 LAB — PROCALCITONIN: Procalcitonin: 1.25 ng/mL

## 2021-07-26 LAB — SEDIMENTATION RATE: Sed Rate: 38 mm/hr — ABNORMAL HIGH (ref 0–22)

## 2021-07-26 MED ORDER — POTASSIUM CHLORIDE CRYS ER 20 MEQ PO TBCR
40.0000 meq | EXTENDED_RELEASE_TABLET | ORAL | Status: AC
  Administered 2021-07-26: 40 meq via ORAL
  Filled 2021-07-26: qty 2

## 2021-07-26 MED ORDER — ALUM & MAG HYDROXIDE-SIMETH 200-200-20 MG/5ML PO SUSP: 15.0000 mL | Freq: Four times a day (QID) | ORAL | Status: DC | PRN

## 2021-07-26 MED ORDER — PANTOPRAZOLE SODIUM 40 MG PO TBEC
40.0000 mg | DELAYED_RELEASE_TABLET | Freq: Every day | ORAL | Status: DC
  Administered 2021-07-26 – 2021-08-01 (×7): 40 mg via ORAL
  Filled 2021-07-26 (×7): qty 1

## 2021-07-26 NOTE — Progress Notes (Signed)
PT Cancellation Note ? ?Patient Details ?Name: [name] ?MRN: [id number] ?DOB: 03-03-1946 ? ? ?Cancelled Treatment:    Reason Eval/Treat Not Completed: Patient declining participation in Physical Therapy for the second day in a row, despite attempting to encourage/educate patient on importance of mobility for ~10 min. Patient reports, "I just need another day for myself... I know my body, and I cannot do this today." Will continue efforts. ? ?Mabeline Caras, PT, DPT ?Acute Rehabilitation Services  ?Pager [phone] ?Office [phone] ? ?[name] ?07/26/2021, 12:56 PM ?

## 2021-07-26 NOTE — Progress Notes (Signed)
ANTICOAGULATION CONSULT NOTE  ?Pharmacy Consult for Heparin ?Indication: pulmonary embolus and DVT ? ? ?Allergies  ?Allergen Reactions  ? Penicillins Hives and Other (See Comments)  ?  Tolerated Ceftriaxone 2023 ? ?Has patient had a PCN reaction causing immediate rash, facial/tongue/throat swelling, SOB or lightheadedness with hypotension: No ?Has patient had a PCN reaction causing severe rash involving mucus membranes or skin necrosis: No ?Has patient had a PCN reaction that required hospitalization No ?Has patient had a PCN reaction occurring within the last 10 years: No ?If all of the above answers are "NO", then may proceed with Cephalosporin use.  ? ? ?Patient Measurements: ?Height: '5\' 3"'$  (160 cm) ?Weight: 78.4 kg (172 lb 13.5 oz) ?IBW/kg (Calculated) : 52.4 ?Heparin Dosing Weight: 70 kg ? ?Vital Signs: ?Temp: 98.6 ?F (65 ?C) (05/10 2013) ?Temp Source: Oral (05/10 2013) ?BP: 142/89 (05/10 2013) ?Pulse Rate: 93 (05/10 2013) ? ?Labs: ?Recent Labs  ?  07/23/21 ?1252 07/24/21 ?0433 07/24/21 ?1306 07/25/21 ?0215 07/25/21 ?1809 07/26/21 ?0322  ?HGB 11.9* 11.1*  --  11.0*  --  10.6*  ?HCT 35.7* 33.0*  --  33.5*  --  32.6*  ?PLT 316 267  --  286  --  232  ?LABPROT  --   --  15.3*  --   --   --   ?INR  --   --  1.2  --   --   --   ?HEPARINUNFRC  --   --   --  0.47 0.10* 0.23*  ?CREATININE 0.65 0.67  --  0.69  --   --   ? ? ? ?Estimated Creatinine Clearance: 57.5 mL/min (by C-G formula based on SCr of 0.69 mg/dL). ? ?Assessment: ? 76 yr old female with new PE/DVT for heparin, now  s/p CT guided catheter drainage of perihepatic abscess 07/25/21 AM. ? ? ?Heparin level subtherapeutic: 0.23, no issues with infusion or s/sx of bleeding per RN. Lab collected ~ 7 hours post infusion restart and no bolus given. Heparin level will likely continue to increase some, but given new PE/DVT will increase infusion rate conservatively and check level this afternoon.   ? ?Goal of Therapy:  ?Heparin level 0.3-0.7 units/ml ?Monitor platelets  by anticoagulation protocol: Yes ?  ?Plan:  ?Increase Heparin to 1350 units/hr  ?Recheck Heparin level 8 hours after rate increased. ?Daily HL, CBC while on heparin.  ? ?Georga Bora, PharmD ?Clinical Pharmacist ?07/26/2021 4:06 AM ?Please check [facility] for all [place] numbers ? ? ? ?

## 2021-07-26 NOTE — Progress Notes (Signed)
Nutrition Follow-up ? ?DOCUMENTATION CODES:  ?Not applicable ? ?INTERVENTION:  ?Continue current diet as ordered, encourage PO intake ?Discontinue boost breeze, pt does not like. ?Discontinue magic cup, pt does not like ?Revisit tube placement if intake does not improve this weekend ? ?NUTRITION DIAGNOSIS:  ?Inadequate oral intake related to poor appetite as evidenced by meal completion < 25%, per patient/family report. ?- remains applicable ? ?GOAL:  ?Patient will meet greater than or equal to 90% of their needs ?- progressing, appetite improving, diet in place ? ?MONITOR:  ?PO intake, Supplement acceptance, Labs ? ?REASON FOR ASSESSMENT:  ?Consult ?Assessment of nutrition requirement/status ? ?ASSESSMENT:  ?Pt with hx of stage IV pancreatic cancer (active chemo), DM, HTN, HLD, hx EtOH abuse (none since 6/22), thyroid dx, and vitamin b12 deficiency presented to ED with AMS, fatigue, and poor appetite. Workup consistent for cholecystitis. ? ?5/9 - thoracentesis, 350 ml of clear yellow fluid removed ?5/10 - CT Guided drain placement (perihepatic fluid collection) ? ?Pt resting in bed at the time of assessment. Daughter at bedside. Talked with pt about intake since last assessment. Reports that she is eating more and is feeling better since the drain was placed yesterday.  ? ?Inquired about the supplements that were added earlier in the week. Pt reports she does not like either, particularly dislikes the magic cup. Will remove both. Had discussion with pt about her admitted decreased energy and increased weakness from her baseline. Discussed the important role nutrition plays in preserving functional status and overall recovery and talked to patient about how she was not meeting her energy needs. Talked with patient about the importance of getting her nutrition, especially since she hopes to continue chemotherapy once she leaves the hospital. Did bring up the idea of a feeding tube and discussed that a cortrak could  be placed as a short term solution while at the hospital to meet her nutrition needs while a g-tube would be a longer-term option. Pt states she is familiar with the g-tube as her mother had one. Pt states she is not interested in having "anything put down my nose." Validated feelings, but did remind her of the importance of increasing her oral intake. Pt agreeable and understands to think of food as part of her treatment plan while hospitalized. Will monitor intake and revisit conversation early next week. Pt is hoping to be dc to rehab by Monday.  ? ?Average Meal Intake: ?5/5: 0% intake x 1 recorded meals ? ?Nutritionally Relevant Medications: ?Scheduled Meds: ? Boost Breeze  1 Container Oral TID BM  ? insulin aspart  0-6 Units Subcutaneous Q6H  ? levothyroxine  125 mcg Oral QAC breakfast  ? metroNIDAZOLE  500 mg Oral Q12H  ? multivitamin with minerals  1 tablet Oral Daily  ? polyethylene glycol  17 g Oral Daily  ? potassium chloride  40 mEq Oral Q3H  ? senna-docusate  2 tablet Oral BID  ? ?Continuous Infusions: ? ceFEPime (MAXIPIME) IV 2 g (07/26/21 8341)  ? ?PRN Meds: prochlorperazine ? ?Labs Reviewed: ?Sodium 134 ?K 3.2 ? ?NUTRITION - FOCUSED PHYSICAL EXAM: ?Flowsheet Row Most Recent Value  ?Orbital Region Mild depletion  ?Upper Arm Region No depletion  ?Thoracic and Lumbar Region No depletion  ?Buccal Region No depletion  ?Temple Region Mild depletion  ?Clavicle Bone Region No depletion  ?Clavicle and Acromion Bone Region No depletion  ?Scapular Bone Region No depletion  ?Dorsal Hand No depletion  ?Patellar Region No depletion  ?Anterior Thigh Region No depletion  ?  Posterior Calf Region No depletion  ?Edema (RD Assessment) None  ?Hair Reviewed  ?Eyes Reviewed  ?Mouth Reviewed  ?Skin Reviewed  ?Nails Reviewed  ? ?Diet Order:   ?Diet Order   ? ?       ?  Diet regular Room service appropriate? Yes; Fluid consistency: Thin  Diet effective now       ?  ? ?  ?  ? ?  ? ? ?EDUCATION NEEDS:  ?Education needs have been  addressed ? ?Skin:  Skin Assessment: Reviewed RN Assessment ? ?Last BM:  5/10 ? ?Height:  ?Ht Readings from Last 1 Encounters:  ?07/18/21 '5\' 3"'$  (1.6 m)  ? ? ?Weight:  ?Wt Readings from Last 1 Encounters:  ?07/24/21 78.4 kg  ? ? ?Ideal Body Weight:  52.3 kg ? ?BMI:  Body mass index is 30.62 kg/m?. ? ?Estimated Nutritional Needs:  ?Kcal:  1800-2000 kcal/d ?Protein:  90-100 g/d ?Fluid:  1.8-2L/d ? ? ?Ranell Patrick, RD, LDN ?Clinical Dietitian ?RD pager # available in [place]  ?After hours/weekend pager # available in [place] ?

## 2021-07-26 NOTE — Progress Notes (Signed)
? ?[facility] Infectious Diseases Follow Up Note ? ?Patient Identification: ?Patient Name: [name] MRN: [id number] [place] Date: 07/18/2021  2:47 PM ?Age: 76 y.o.Today's Date: 07/26/2021 ? ?Reason for Visit: Bacteremia/abscess ? ?Principal Problem: ?  Acute cholecystitis ?Active Problems: ?  Acquired hypothyroidism ?  GAD (generalized anxiety disorder) ?  Diet-controlled diabetes mellitus ([place]) ?  Acute metabolic encephalopathy ?  Hyponatremia ?  Cholecystitis ?  Sepsis ([place]) ?  Goals of care, counseling/discussion ? ?Antibiotics:  ?Vancomycin 5/3 ?Cefepime 5/3, 5/6-c ?Aztreonam 5/3, Ceftriaxone 5/4-5/6 ?Metronidazole 5/3-c ?  ?Lines/Hardware: Rt chest port, GI stent, RT UQ drain ? ?Interval Events: Remains afebrile, leukocytosis is downtrending. S/p CT guided drainage of perihepatic fluid ? ? ?Assessment ?# Subcapsular fluid collection adjacent to the liver.  17.6 cm ?# Moderate-sized right pleural effusion with atelectasis in the right lower lobe ?- s/p rt sided thoracentesis 5/9. Cx no growth  ?- S/p CT guided drainage of perihepatic fluid 5/10. Cx pending ? ? 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  ?# Acute cholecystitis S/p cholecystostomy by IR 5/5. Cx with Morganella morganii/B ovatus and E coli  ?  ?# leukocytosis - downtrending  ?  ?# Pancreatic ca on chemotherapy ( last chemo gemcitabine/abraxane 4/24 and next due 5/9) ?  ?# DM ( a1c 6.3) ?  ?Recommendations ?Continue cefepime and metronidazole ?Follow-up CBC and pending cultures ?Monitor drain output  ?Drain management and repeat imaging per IR ?Considering PO bactrim/ciprofloxacin + PO metronidazole for discharge ?Following ? ?Rest of the management as per the primary team. ?Thank you for the consult. Please page with pertinent questions or concerns. ? ?______________________________________________________________________ ?Subjective ?patient seen and examined at the bedside.  Husband at  bedside ?Abdominal pain present but better ?Mild nausea but denies vomiting ? ?Vitals ?BP 134/80 (BP Location: Right Arm)   Pulse 86   Temp (!) 97.5 ?F (36.4 ?C) (Oral)   Resp 18   Ht '5\' 3"'$  (1.6 m)   Wt 78.4 kg   SpO2 97%   BMI 30.62 kg/m?  ? ?  ?Physical Exam ?Constitutional: Lying in the bed ?   Comments:  ? ?Cardiovascular:  ?   Rate and Rhythm: Normal rate and regular rhythm.  ?   Heart sounds:  ? ?Pulmonary:  ?   Effort: Pulmonary effort is normal on room air  ?   Comments:  ? ?Abdominal:  ?   Palpations: Abdomen is soft.  ?   Tenderness: mild abdominal tenderness, no RT and 2 drainage catheters in place ? ?Musculoskeletal:     ?   General: No swelling or tenderness.  ? ?Skin: ?   Comments:  ? ?Neurological:  ?   General: Grossly nonfocal, awake alert and oriented ? ?Psychiatric:     ?   Mood and Affect: Mood normal.  ? ?Pertinent Microbiology ?Results for orders placed or performed during the hospital encounter of 07/18/21  ?Resp Panel by RT-PCR (Flu A&B, Covid) Nasopharyngeal Swab     Status: Abnormal  ? Collection Time: 07/18/21  2:47 PM  ? Specimen: Nasopharyngeal Swab; Nasopharyngeal(NP) swabs in vial transport medium  ?Result Value Ref Range Status  ? SARS Coronavirus 2 by RT PCR POSITIVE (A) NEGATIVE Final  ?  Comment: (NOTE) ?SARS-CoV-2 target nucleic acids are DETECTED. ? ?The SARS-CoV-2 RNA is generally detectable in upper respiratory ?specimens during the acute phase of infection. Positive results are ?indicative of the presence of the identified virus, but do not rule ?out bacterial infection or co-infection with other  pathogens not ?detected by the test. Clinical correlation with patient history and ?other diagnostic information is necessary to determine patient ?infection status. The expected result is Negative. ? ?Fact Sheet for Patients: ?EntrepreneurPulse.com.au ? ?Fact Sheet for Healthcare Providers: ?IncredibleEmployment.be ? ?This test is not yet  approved or cleared by the [place] FDA and  ?has been authorized for detection and/or diagnosis of SARS-CoV-2 by ?FDA under an Emergency Use Authorization (EUA).  This EUA will ?remain in effect (meaning this test can be used) for the duration of  ?the COVID-19 declaration under Section 564(b)(1) of the A ct, 21 ?U.S.C. section 360bbb-3(b)(1), unless the authorization is ?terminated or revoked sooner. ? ?  ? Influenza A by PCR NEGATIVE NEGATIVE Final  ? Influenza B by PCR NEGATIVE NEGATIVE Final  ?  Comment: (NOTE) ?The Xpert Xpress SARS-CoV-2/FLU/RSV plus assay is intended as an aid ?in the diagnosis of influenza from Nasopharyngeal swab specimens and ?should not be used as a sole basis for treatment. Nasal washings and ?aspirates are unacceptable for Xpert Xpress SARS-CoV-2/FLU/RSV ?testing. ? ?Fact Sheet for Patients: ?EntrepreneurPulse.com.au ? ?Fact Sheet for Healthcare Providers: ?IncredibleEmployment.be ? ?This test is not yet approved or cleared by the [place] FDA and ?has been authorized for detection and/or diagnosis of SARS-CoV-2 by ?FDA under an Emergency Use Authorization (EUA). This EUA will remain ?in effect (meaning this test can be used) for the duration of the ?COVID-19 declaration under Section 564(b)(1) of the Act, 21 U.S.C. ?section 360bbb-3(b)(1), unless the authorization is terminated or ?revoked. ? ?Performed at [facility], [place] [address], [place], [place] ?[address] ?  ?Culture, blood (Routine x 2)     Status: Abnormal  ? Collection Time: 07/18/21  3:03 PM  ? Specimen: BLOOD LEFT HAND  ?Result Value Ref Range Status  ? Specimen Description BLOOD LEFT HAND  Final  ? Special Requests   Final  ?  BOTTLES DRAWN AEROBIC AND ANAEROBIC Blood Culture results may not be optimal due to an inadequate volume of blood received in culture bottles  ? Culture  Setup Time   Final  ?  GRAM NEGATIVE RODS ?ANAEROBIC BOTTLE ONLY ?CRITICAL VALUE NOTED.  VALUE  IS CONSISTENT WITH PREVIOUSLY REPORTED AND CALLED VALUE. ?IN BOTH AEROBIC AND ANAEROBIC BOTTLES ?  ? Culture (A)  Final  ?  MORGANELLA MORGANII ?CRITICAL RESULT CALLED TO, READ BACK BY AND VERIFIED WITH: PHARMD G.BARR AT 1221 ON 07/21/2021 BY T.SAAD. ?Performed at [facility], [employer] [address], [place], [place] [address] ?  ? Report Status 07/23/2021 FINAL  Final  ? Organism ID, Bacteria MORGANELLA MORGANII  Final  ?    Susceptibility  ? Morganella morganii - MIC*  ?  AMPICILLIN >=32 RESISTANT Resistant   ?  CEFAZOLIN >=64 RESISTANT Resistant   ?  CEFTAZIDIME <=1 SENSITIVE Sensitive   ?  CIPROFLOXACIN <=0.25 SENSITIVE Sensitive   ?  GENTAMICIN <=1 SENSITIVE Sensitive   ?  IMIPENEM 8 INTERMEDIATE Intermediate   ?  TRIMETH/SULFA <=20 SENSITIVE Sensitive   ?  AMPICILLIN/SULBACTAM >=32 RESISTANT Resistant   ?  PIP/TAZO <=4 SENSITIVE Sensitive   ?  * MORGANELLA MORGANII  ?Culture, blood (Routine x 2)     Status: Abnormal  ? Collection Time: 07/18/21  3:07 PM  ? Specimen: BLOOD RIGHT FOREARM  ?Result Value Ref Range Status  ? Specimen Description BLOOD RIGHT FOREARM  Final  ? Special Requests   Final  ?  BOTTLES DRAWN AEROBIC AND ANAEROBIC Blood Culture adequate volume  ?  Culture  Setup Time   Final  ?  GRAM NEGATIVE RODS ?ANAEROBIC BOTTLE ONLY ?IN BOTH AEROBIC AND ANAEROBIC BOTTLES ?CRITICAL RESULT CALLED TO, READ BACK BY AND VERIFIED WITH: ?PHARMD GREG ABBOTT 07/19/21'@6'$ :10 BY TW ?  ? Culture (A)  Final  ?  ESCHERICHIA COLI ?MORGANELLA MORGANII ?SUSCEPTIBILITIES PERFORMED ON PREVIOUS CULTURE WITHIN THE LAST 5 DAYS. ?Performed at [facility], [place] [address] [place], [place] [address] ?  ? Report Status 07/23/2021 FINAL  Final  ? Organism ID, Bacteria ESCHERICHIA COLI  Final  ?    Susceptibility  ? Escherichia coli - MIC*  ?  AMPICILLIN 8 SENSITIVE Sensitive   ?  CEFAZOLIN <=4 SENSITIVE Sensitive   ?  CEFEPIME <=0.12 SENSITIVE Sensitive   ?  CEFTAZIDIME <=1 SENSITIVE Sensitive   ?  CEFTRIAXONE <=0.25 SENSITIVE  Sensitive   ?  CIPROFLOXACIN <=0.25 SENSITIVE Sensitive   ?  GENTAMICIN <=1 SENSITIVE Sensitive   ?  IMIPENEM <=0.25 SENSITIVE Sensitive   ?  TRIMETH/SULFA <=20 SENSITIVE Sensitive   ?  AMPICILLIN/SULBACTAM 4 S

## 2021-07-26 NOTE — Progress Notes (Signed)
?PROGRESS NOTE ? ? ? ?[name]  KZS:[id number] DOB: 17-Mar-1946 DOA: 07/18/2021 ?PCP: [name], MD  ? ? ?Brief Narrative:  ? ?[name] is a 76 year old female with past medical history significant for pancreatic cancer on chemotherapy, type 2 diabetes mellitus, hypothyroidism, GERD who presented to [facility] ED on 5/3 via EMS with confusion and fever.  Patient also has been complaining of generalized abdominal pain with intermittent nausea.  ? ?In the ED, sodium 129, potassium 3.6, chloride 102, CO2 16, glucose 191, BUN 11, creatinine 0.77.  Lactic acid 1.6.  WBC 7.7, hemoglobin 10.9.  TSH 2.251.  CT abdomen/pelvis with diffuse gallbladder wall thickening and edema with apparent area of discontinuity posterior aspect of the distal gallbladder wall with concern for gallbladder rupture or perforation, small ascites, sigmoid diverticulosis, ill-defined known pancreatic mass encasing the portal vein and portal splenic confluence with collateralization, several small bilateral pulmonary nodules, hypodense lesion inferior right lobe.  General surgery was consulted.  Hospital service consulted for further evaluation and management of altered mental status and concern for acute cholecystitis. ? ?Assessment & Plan: ?  ?Acute metabolic encephalopathy ?Etiology likely secondary to severe sepsis with bacteremia/UTI and acute cholecystitis.  Now resolved and appears to be at baseline. ?--Supportive care and treatment as below ? ?Severe sepsis, POA ?Morganella morganii/E. coli bacteremia ?E. coli/Enterococcus faecium UTI ?Acute cholecystitis s/p cholecystostomy ?Patient presenting to ED with confusion and found to have concerns for acute cholecystitis on CT abdomen/pelvis.  Initially started on empiric antibiotics with vancomycin, metronidazole, cefepime/aztreonam.  Evaluated by general surgery with recommendations of IR placement of cholecystostomy.  Underwent IR cholecystostomy placement on 07/20/2021 with culture  positive for E. coli, Morganella morganii, Bacteroides ovatus. Repeat CT abdomen/pelvis notable for new subcapsular fluid collection near the liver spanning 17 cm. ?--WBC 7.7>>24.7>20.2>21.5>17.2 ?--Drain culture 5/10: No organisms on Gram stain, further culture pending ?--RUQ drain w/ 14m output last 24h ?--JP drain with 6584moutput last 24h ?--Cefepime 2 g IV every 12 hours ?--Metronidazole 500 mg PO q12h ?--CBC daily ?--Continue monitor drain output ?--Await further recommendations per ID ? ?Left lower lobe pulmonary embolism ?CTA chest 5/9 with bilateral segmental and subsegmental pulmonary emboli.  Vascular duplex ultrasound bilateral lower extremities 5/9 with findings consistent with acute DVT right common femoral vein, acute DVT left common femoral vein and left proximal profunda vein; common femoral vein obstruction does not appear to extend above inguinal ligament. ?--Continue heparin drip, anticipate transition to apixaban on discharge ? ?Right pleural effusion ?Underwent IR thoracentesis on 07/24/2021 with removal of 350 mL clear yellow fluid.  Positive lights criteria consistent with exudative pleural effusion.  Pleural fluid cytology negative for malignant cells. ?--Pleural fluid culture: No growth x 2 days ? ?Type 2 diabetes mellitus ?Hemoglobin A1c 6.3 on 07/10/2021, well controlled. ?--SSI for coverage ?--CBGs qAC/HS ? ?Pancreatic cancer ?Follows with medical oncology outpatient, Dr. ShBenay Spice? ?Hypothyroidism ?--Levothyroxine 100 mcg p.o. daily ? ?Generalized anxiety disorder ?-- Alprazolam 0.25 PO daily PRN ? ?Essential hypertension ?--Amlodipine 5 mg p.o. daily ? ?Hypomagnesemia ?Repleted. ?--Magnesium level in a.m. ? ?Hypokalemia ?Potassium 3.2 this morning, will replete. ?--Repeat BMP in a.m. to include magnesium ? ?Hx COVID-19 viral infection 06/20/2021 ?Oxygenating well on room air. ? ?Ethics: ?Given patient's multiple comorbidities, debility and clinical decline, palliative care was consulted  for assistance with goals of care and medical decision making.  Seen by palliative care on 5/9 and recommendations are to continue with full code/full scope of treatment.  Outpatient palliative care to  follow-up at cancer center arranged following discharge. ? ?Physical deconditioning: ?--Continue PT/OT efforts while inpatient ?--Pending SNF placement ? ? ?DVT prophylaxis: SCDs Start: 07/18/21 1925 ? ?  Code Status: Full Code ?Family Communication: Updated family present at bedside this morning ? ?Disposition Plan:  ?Level of care: Med-Surg ?Status is: Inpatient ?Remains inpatient appropriate because: awaiting further recommendations regarding antibiotic treatment/duration per infectious disease depended on culture results, anticipate discharge to SNF 2-3 days ?  ? ?Consultants:  ?General surgery, signed off 5/6 ?Infectious disease ?Palliative care ?Interventional radiology ? ?Procedures:  ?IR cholecystostomy placement ?IR drain placement 5/10 ? ?Antimicrobials:  ?Vancomycin 5/3 - 5/3 ?Aztreonam 5/3 - 5/3 ?Ceftriaxone 5/4 - 5/6 ?Cefepime 5/3, 5/6>> ?Metronidazole 5/3>> ? ? ?Subjective: ?Patient seen examined bedside, resting currently.  Family present.  Complaining of some mild abdominal discomfort at drain site. No other specific questions or concerns at this time.  Denies headache, no chest pain, no palpitations, no shortness of breath, no nausea/vomiting/diarrhea, no fever/chills/night sweats, no paresthesias, no fatigue.  No acute concerns overnight per nursing staff. ? ?Objective: ?Vitals:  ? 07/25/21 1605 07/25/21 2013 07/26/21 0429 07/26/21 0844  ?BP: 140/87 (!) 142/89 139/89 134/80  ?Pulse: 94 93 85 86  ?Resp: '16 16 18 18  '$ ?Temp: (!) 97.3 ?F (36.3 ?C) 98.6 ?F (37 ?C) 98.1 ?F (36.7 ?C) (!) 97.5 ?F (36.4 ?C)  ?TempSrc: Oral Oral  Oral  ?SpO2: 97% 96% 98% 97%  ?Weight:      ?Height:      ? ? ?Intake/Output Summary (Last 24 hours) at 07/26/2021 1217 ?Last data filed at 07/26/2021 0432 ?Gross per 24 hour  ?Intake  469.97 ml  ?Output 935 ml  ?Net -465.03 ml  ? ?Filed Weights  ? 07/18/21 1511 07/21/21 0500 07/24/21 0601  ?Weight: 67.1 kg 67 kg 78.4 kg  ? ? ?Examination: ? ?Physical Exam: ?GEN: NAD, alert and oriented x 3, chronically ill in appearance ?HEENT: NCAT, PERRL, EOMI, sclera clear, MMM ?PULM: CTAB w/o wheezes/crackles, normal respiratory effort, on room air ?CV: RRR w/o M/G/R ?GI: abd soft, NTND, NABS, no R/G/M, abdominal drains x 2 noted ?MSK: no peripheral edema, muscle strength globally intact 5/5 bilateral upper/lower extremities ?NEURO: CN II-XII intact, no focal deficits, sensation to light touch intact ?PSYCH: Depressed mood, flat affect ?Integumentary: dry/intact, no rashes or wounds ? ? ? ?Data Reviewed: I have personally reviewed following labs and imaging studies ? ?CBC: ?Recent Labs  ?Lab 07/22/21 ?5361 07/23/21 ?1252 07/24/21 ?4431 07/25/21 ?0215 07/26/21 ?5400  ?WBC 18.5* 24.7* 20.2* 21.5* 17.2*  ?NEUTROABS 13.9* 18.1* 14.5* 14.8* 15.8*  ?HGB 11.1* 11.9* 11.1* 11.0* 10.6*  ?HCT 33.6* 35.7* 33.0* 33.5* 32.6*  ?MCV 92.1 93.2 91.9 93.1 92.6  ?PLT 245 316 267 286 232  ? ?Basic Metabolic Panel: ?Recent Labs  ?Lab 07/20/21 ?0158 07/21/21 ?0233 07/22/21 ?8676 07/23/21 ?1252 07/24/21 ?1950 07/25/21 ?0215 07/26/21 ?9326  ?NA 132*   < > 134* 134* 135 135 134*  ?K 3.9   < > 3.8 3.5 3.5 3.3* 3.2*  ?CL 103   < > 109 109 110 110 108  ?CO2 18*   < > 17* 15* 18* 18* 22  ?GLUCOSE 123*   < > 119* 196* 151* 136* 152*  ?BUN 13   < > '11 12 9 9 8  '$ ?CREATININE 0.89   < > 0.63 0.65 0.67 0.69 0.61  ?CALCIUM 7.9*   < > 8.0* 7.7* 7.7* 7.7* 7.4*  ?MG 2.1  --   --   --   --   --  1.9  ? < > = values in this interval not displayed.  ? ?GFR: ?Estimated Creatinine Clearance: 57.5 mL/min (by C-G formula based on SCr of 0.61 mg/dL). ?Liver Function Tests: ?Recent Labs  ?Lab 07/21/21 ?0233 07/22/21 ?9090 07/24/21 ?3014 07/25/21 ?0215 07/26/21 ?9969  ?AST 12* '20 21 26 28  '$ ?ALT '12 14 13 14 12  '$ ?ALKPHOS 73 138* 183* 204* 188*  ?BILITOT 0.7  0.7 0.9 0.9 0.8  ?PROT 4.8* 4.8* 4.9* 5.0* 4.7*  ?ALBUMIN 1.8* 1.7* 1.7* 1.7* 1.6*  ? ?No results for input(s): LIPASE, AMYLASE in the last 168 hours. ?No results for input(s): AMMONIA in the last 168 h

## 2021-07-26 NOTE — Progress Notes (Signed)
OT Cancellation Note ? ?Patient Details ?Name: [name] ?MRN: [id number] ?DOB: 01-28-1946 ? ? ?Cancelled Treatment:    Reason Eval/Treat Not Completed: Patient declined, no reason specified. Provided maximum encouragement and educated pt in contraindications for remaining in bed.  ? ?Malka So ?07/26/2021, 12:56 PM ?Nestor Lewandowsky, OTR/L ?[facility] ?Pager: [phone] ?Office: [phone]  ?

## 2021-07-26 NOTE — Progress Notes (Addendum)
HEMATOLOGY-ONCOLOGY PROGRESS NOTE ? ?ASSESSMENT AND PLAN: ?Adenocarcinoma the pancreas body, stage IV (cT4,cN0,pM1) ?Ultrasound abdomen 08/18/2020-possible hypoechoic pancreas body mass, small hypoechoic liver areas ?MRI abdomen 08/27/2020-pancreas body mass, multiple hepatic lesions consistent with metastases, right abdominal omental nodularity, pancreas mass extends to the celiac bifurcation and abuts the splenic vein and splenoportal confluence ?CT chest 09/07/2020-scattered pulmonary nodules concerning for metastases, pancreas body mass, hepatic metastases ?Ultrasound-guided biopsy of a right liver lesion 09/08/2020-adenocarcinoma consistent with a pancreas primary ?CT abdomen/pelvis 09/24/2020-pancreas neck mass, omental and peritoneal nodularity-unchanged, multiple hypoenhancing liver masses-unchanged, numerous small pulmonary nodules ?Cycle 1 gemcitabine/Abraxane 10/04/2020 ?Cycle 2 gemcitabine/Abraxane 10/27/2020 ?Cycle 3 gemcitabine/Abraxane 11/11/2018 ?Cycle 4 gemcitabine/Abraxane 11/23/2020 ?Cycle 5 gemcitabine/Abraxane 12/08/2020 ?CT abdomen/pelvis 12/18/2020-stable pancreas mass, stable and improved liver lesions, resolution of omental soft tissue density, stable indeterminate lung nodules, no disease progression ?Cycle 6 gemcitabine/Abraxane 12/22/2020 ?Cycle 7 gemcitabine 01/05/2021, 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  ?Cycle 8 gemcitabine/Abraxane 01/19/2021 ?Cycle 9 gemcitabine/Abraxane 02/01/2021 ?Cycle 10 Gemcitabine 02/16/2021, Abraxane held due to increased neuropathy ?Cycle 11 Gemcitabine 03/05/2021, 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 ?Cycle 12 gemcitabine 03/14/2021, 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 ?Cycle 13 gemcitabine 04/02/2021, 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 ?Cycle 14 gemcitabine/Abraxane 04/16/2021, Abraxane resumed at a reduced dose ?Cycle 15 gemcitabine/Abraxane 04/30/2021 ?CT abdomen/pelvis 05/08/2021-stable pancreas mass, liver metastases are slightly smaller, stable tiny bilateral lower lung nodules ?Cycle  16 gemcitabine/Abraxane 05/14/2021, Abraxane escalated back to 100 mg per metered squared ?Cycle 17 gemcitabine/Abraxane 05/28/2021 ?Cycle 18 gemcitabine/Abraxane 06/11/2021 ?Cycle 19 gemcitabine/Abraxane 07/09/2021 ?CT Abdo/pelvis 07/18/2021-diffuse gallbladder wall thickening and edema, small volume ascites, stable pulmonary nodules, indeterminate 15 x 18 mm right liver lesion, ill-defined hypodensity in the body of the pancreas ?  ?2.  Pain secondary #1 ?3.  Diabetes ?4.  Hypertension ?5.  Osteoarthritis ?6.  Port-A-Cath placement 09/22/2020 ?7.  Admission with jaundice 09/24/2020.  MRI-new abrupt constriction of the common hepatic duct.  The infiltrative pancreatic mass extends medially in the porta hepatis to obstruct the common hepatic duct.  Multiple right hepatic lobe metastases mildly increased in size.  Stent placed into the common bile duct 09/28/2020. ?8.  COVID-19 06/20/2021, admission with COVID and bacterial pneumonia?  06/30/2021-completed course of antibiotics ?9.  Hospital admission 07/18/2021-bacteremia, UTI, acute cholecystitis ?CT abdomen/pelvis 07/24/2021-moderate right pleural effusion, new subcapsular fluid collections adjacent to the right liver, interval cholecystostomy tube, stable pancreas body mass, incidental pulmonary embolism and a left lower lobe pulmonary artery ?10.  Pulmonary embolism/bilateral DVTs ?CT abdomen/pelvis 07/24/2021-incidental left lower lobe pulmonary embolism ?CT chest 07/24/2021-segmental and subsegmental bilateral pulmonary emboli ?Dopplers 07/24/2021-DVTs in the right common femoral, left common femoral, left profunda veins ?Heparin 07/24/2021 ? ?[name] appears stable.  She remains on heparin for new diagnosis of DVTs and pulmonary emboli.  She is at high risk for venous thromboembolic disease due to her pancreatic cancer diagnosis, recent COVID-19 infection, and immobility.  Plan is to continue anticoagulation with heparin and transition to apixaban prior to discharge. ? ?CT scans  performed this admission show no clear-cut evidence of pancreatic cancer progression.  Cytology from the thoracentesis performed 07/24/2021 showed reactive mesothelial cells and no malignant cells identified.  Plan is to resume gemcitabine/Abraxane when she has recovered. ? ?ID is following her for her bacteremia.  Their plan is to continue on cefepime and metronidazole we will continue to follow cultures.  They will transition the patient to oral antibiotics prior to discharge. ? ?Recommendations: ?1.  Continue heparin anticoagulation, convert to apixaban at discharge ?2.  Management of the cholecystitis/perihepatic/pleural fluid collections  per the medical service and interventional radiology ?3.  Continue antibiotics per recommendation of ID. ?4.  Outpatient follow-up will be scheduled at the Cancer center, please call oncology as needed ? ?[name] was interviewed and examined.  She appears unchanged.  She continues to have pain in the right upper abdomen.  She continues antibiotics for treatment of cholecystitis and bacteremia.  The cytology from the right pleural fluid is negative for malignancy. ? ?She is scheduled for outpatient follow-up at the cancer center 08/09/2021.  The plan is to resume gemcitabine/Abraxane chemotherapy if her performance status has improved. ? ?I was present for greater than 50% of today's visit.  I performed medical decision making. ? ?Julieanne Manson, MD ? ?SUBJECTIVE: Continues to have right upper quadrant abdominal pain.  On heparin and no bleeding reported. ? ?Oncology History Overview Note  ?Cancer Staging ?Primary pancreatic cancer with metastasis to other site [facility]) ?Staging form: Exocrine Pancreas, AJCC 8th Edition ?- Clinical stage from 09/08/2020: Stage IV (cT4, cN0, pM1) - Signed by Truitt Merle, MD on 09/12/2020 ?Stage prefix: Initial diagnosis ?Total positive nodes: 0 ? ?  ?Primary pancreatic cancer with metastasis to other site [facility])  ?08/27/2020 Imaging  ? Abdominal MRI w wo  contrast: ?IMPRESSION: ?1. Pancreatic body infiltrative mass, favoring adenocarcinoma. ?2. Multiple hepatic lesions, most consistent with metastasis. ?3. Right abdominal omental nodularity, highly suspicious for ?peritoneal metastasis. ?4. Recommend multidisciplinary oncology consultation for eventual ?sampling of either the liver or pancreatic lesions. ?5. Vascular involvement with tumor, as detailed above. ?  ?09/07/2020 Imaging  ? CT chest  ?IMPRESSION: ?1. Scattered small pulmonary nodules, worrisome for metastatic ?disease. ?2. Pancreatic body mass and hepatic metastases, better seen and ?described on MR abdomen 08/27/2020. ?3. Aortic atherosclerosis (ICD10-I70.0). Coronary artery ?calcification. ?  ?09/08/2020 Cancer Staging  ? Staging form: Exocrine Pancreas, AJCC 8th Edition ?- Clinical stage from 09/08/2020: Stage IV (cT4, cN0, pM1) - Signed by Truitt Merle, MD on 09/12/2020 ?Stage prefix: Initial diagnosis ?Total positive nodes: 0 ?  ?09/08/2020 Pathology Results  ? A. LIVER, RIGHT, BIOPSY:  ?- Adenocarcinoma.  See comment.  ? ?COMMENT:  ? ?The morphology is consistent with a pancreatic primary.  ?  ?09/12/2020 Initial Diagnosis  ? Primary pancreatic cancer with metastasis to other site [facility]) ?  ?10/12/2020 -  Chemotherapy  ? Patient is on Treatment Plan : PANCREATIC Abraxane / Gemcitabine D1,8,15 q28d  ?   ? ?PHYSICAL EXAMINATION: ? ?Vitals:  ? 07/26/21 0429 07/26/21 0844  ?BP: 139/89 134/80  ?Pulse: 85 86  ?Resp: 18 18  ?Temp: 98.1 ?F (36.7 ?C) (!) 97.5 ?F (36.4 ?C)  ?SpO2: 98% 97%  ? ?Filed Weights  ? 07/18/21 1511 07/21/21 0500 07/24/21 0601  ?Weight: 67.1 kg 67 kg 78.4 kg  ? ? ?Intake/Output from previous day: ?05/10 0701 - 05/11 0700 ?In: 470 [I.V.:269.9; IV Piggyback:200.1] ?Out: 4193 [Urine:500; Drains:665] ? ?Physical Exam ?Vitals reviewed.  ?Constitutional:   ?   General: She is not in acute distress. ?HENT:  ?   Head: Normocephalic.  ?Eyes:  ?   General: No scleral icterus. ?   Conjunctiva/sclera:  Conjunctivae normal.  ?Cardiovascular:  ?   Rate and Rhythm: Normal rate.  ?Pulmonary:  ?   Effort: Pulmonary effort is normal. No respiratory distress.  ?Abdominal:  ?   Palpations: Abdomen is soft.  ?   Com

## 2021-07-26 NOTE — Progress Notes (Signed)
? ? ?Referring Physician(s): ?Dr. Karleen Hampshire ? ?Supervising Physician: Sandi Mariscal ? ?Patient Status:  [facility] - In-pt ? ?Chief Complaint: ?Subcapsular hepatic fluid collection ? ?Subjective: ?Just finished bathing.   ?Reports soreness related to drains. ? ?Allergies: ?Penicillins ? ?Medications: ?Prior to Admission medications   ?Medication Sig Start Date End Date Taking? Authorizing Provider  ?ACCU-CHEK AVIVA PLUS test strip As needed 05/09/21   [name], MD  ?Accu-Chek Softclix Lancets lancets As needed 04/23/21   [name], MD  ?albuterol (VENTOLIN HFA) 108 (90 Base) MCG/ACT inhaler Inhale 2 puffs into the lungs every 6 (six) hours as needed for wheezing or shortness of breath. ?Patient not taking: Reported on 07/10/2021 07/02/21   Flora Lipps, MD  ?Alcohol Swabs PADS USE AS DIRECTED 05/18/21   [name], MD  ?ALPRAZolam Duanne Moron) 0.5 MG tablet Take 1 tablet (0.5 mg total) by mouth daily as needed for anxiety. 11/23/20   Owens Shark, NP  ?amLODipine (NORVASC) 5 MG tablet Take 5 mg by mouth daily. ?Patient not taking: Reported on 07/10/2021 03/30/21   [provider]  ?ascorbic acid (VITAMIN C) 500 MG tablet Take 1 tablet (500 mg total) by mouth daily. ?Patient not taking: Reported on 07/10/2021 07/03/21 08/02/21  Flora Lipps, MD  ?aspirin EC 81 MG tablet Take 81 mg by mouth every morning.    [provider]  ?Blood Glucose Monitoring Suppl (ACCU-CHEK GUIDE) w/Device KIT As needed 05/11/21   [name], MD  ?cyanocobalamin (,VITAMIN B-12,) 1000 MCG/ML injection Inject 1 mL (1,000 mcg total) into the muscle every 30 (thirty) days. 01/11/21   [name], MD  ?docusate sodium (COLACE) 100 MG capsule Take 100 mg by mouth 2 (two) times daily as needed (constipation).    [provider]  ?guaiFENesin-dextromethorphan (ROBITUSSIN DM) 100-10 MG/5ML syrup Take 10 mLs by mouth every 4 (four) hours as needed for cough. ?Patient not taking: Reported on 07/10/2021 07/02/21   Flora Lipps, MD  ?levothyroxine (SYNTHROID) 125 MCG tablet TAKE 1 TABLET ON AN EMPTY STOMACH IN THE MORNING ?Patient taking differently: Take 125 mcg by mouth daily before breakfast. 06/04/21   [name], MD  ?lidocaine-prilocaine (EMLA) cream APPLY 1 APPLICATION TOPICALLY AS NEEDED. ?Patient taking differently: Apply 1 application. topically daily as needed (numbing). 07/04/21   Ladell Pier, MD  ?omeprazole (PRILOSEC) 20 MG capsule TAKE 1 CAPSULE BY MOUTH EVERY DAY ?Patient taking differently: Take 20 mg by mouth daily. 04/18/21   [name], MD  ?ondansetron (ZOFRAN) 8 MG tablet Take 1 tablet (8 mg total) by mouth 2 (two) times daily as needed (Nausea or vomiting). ?Patient not taking: Reported on 07/10/2021 09/12/20   Truitt Merle, MD  ?prochlorperazine (COMPAZINE) 10 MG tablet Take 1 tablet (10 mg total) by mouth every 6 (six) hours as needed (Nausea or vomiting). 09/12/20   Truitt Merle, MD  ?zinc sulfate 220 (50 Zn) MG capsule Take 1 capsule (220 mg total) by mouth daily. ?Patient not taking: Reported on 07/06/2021 07/03/21 08/02/21  Flora Lipps, MD  ? ? ? ?Vital Signs: ?BP 134/80 (BP Location: Right Arm)   Pulse 86   Temp (!) 97.5 ?F (36.4 ?C) (Oral)   Resp 18   Ht '5\' 3"'  (1.6 m)   Wt 172 lb 13.5 oz (78.4 kg)   SpO2 97%   BMI 30.62 kg/m?  ? ?Physical Exam ?NAD, alert ?Abdomen: ?Drain #1:  perc chole in RUQ.  Insertion site c/d/I. Output thin, bilious ?Drain #  2: subcapsular drain.  Insertion site c/d/I. Output thin, serous.  ? ?Imaging: ?DG Chest 1 View ? ?Result Date: 07/24/2021 ?CLINICAL DATA:  Status post right thoracentesis. EXAM: CHEST  1 VIEW COMPARISON:  Chest x-ray Jul 23, 2021. FINDINGS: Persistent small to moderate right pleural effusion. No visible pneumothorax on this semi-erect radiograph. Similar cardiomediastinal silhouette. Right IJ approach Port-A-Cath with the tip projecting at the superior right atrium, unchanged. IMPRESSION: No visible pneumothorax on this semi-erect radiograph. Small to  moderate right pleural effusion. Electronically Signed   By: Margaretha Sheffield M.D.   On: 07/24/2021 11:25  ? ?CT Angio Chest Pulmonary Embolism (PE) W or WO Contrast ? ?Result Date: 07/24/2021 ?CLINICAL DATA:  Pulmonary embolism on recent abdominal CT. EXAM: CT ANGIOGRAPHY CHEST WITH CONTRAST TECHNIQUE: Multidetector CT imaging of the chest was performed using the standard protocol during bolus administration of intravenous contrast. Multiplanar CT image reconstructions and MIPs were obtained to evaluate the vascular anatomy. RADIATION DOSE REDUCTION: This exam was performed according to the departmental dose-optimization program which includes automated exposure control, adjustment of the mA and/or kV according to patient size and/or use of iterative reconstruction technique. CONTRAST:  32m OMNIPAQUE IOHEXOL 350 MG/ML SOLN COMPARISON:  CT abdomen and pelvis 07/24/2021. CTA chest 07/18/2021. FINDINGS: Cardiovascular: Segmental and subsegmental pulmonary arterial emboli are present bilaterally, most notably in the left lower lobe (as seen on the recent abdominal CT) and in the right upper lobe, and with these being new from the 07/18/2021 chest CTA. There is thoracic aortic atherosclerosis without aneurysm. The heart is upper limits of normal in size. There is no pericardial effusion. Coronary atherosclerosis is noted. A right jugular Port-A-Cath terminates in the right atrium. Mediastinum/Nodes: No enlarged axillary, mediastinal, or hilar lymph nodes. Small sliding hiatal hernia. Lungs/Pleura: Moderate right and small left pleural effusions with associated compressive atelectasis in the right greater than left lower lobes. Grossly unchanged scattered bilateral lung nodules measuring up to 4-5 mm in size as detailed on the recent prior chest CTA. Upper Abdomen: Partially visualized subcapsular fluid collection along the lateral aspect of the right hepatic lobe, more fully evaluated on today's abdominal CT. Partially  visualized biliary stent. Musculoskeletal: No acute osseous abnormality or suspicious osseous lesion. Review of the MIP images confirms the above findings. IMPRESSION: 1. Bilateral segmental and subsegmental pulmonary emboli. 2. Moderate right and small left pleural effusions with atelectasis in the lower lobes. 3. Partially visualized subcapsular hepatic fluid collection, more fully evaluated on today's abdominal CT. 4. Aortic Atherosclerosis (ICD10-I70.0). Electronically Signed   By: ALogan BoresM.D.   On: 07/24/2021 10:55  ? ?CT ABDOMEN PELVIS W CONTRAST ? ?Result Date: 07/24/2021 ?CLINICAL DATA:  Nausea, vomiting EXAM: CT ABDOMEN AND PELVIS WITH CONTRAST TECHNIQUE: Multidetector CT imaging of the abdomen and pelvis was performed using the standard protocol following bolus administration of intravenous contrast. RADIATION DOSE REDUCTION: This exam was performed according to the departmental dose-optimization program which includes automated exposure control, adjustment of the mA and/or kV according to patient size and/or use of iterative reconstruction technique. CONTRAST:  1051mOMNIPAQUE IOHEXOL 300 MG/ML  SOLN COMPARISON:  07/18/2021 FINDINGS: Lower chest: Moderate right pleural effusion. Compressive atelectasis in the right lower lobe. This is new since prior study. Trace left pleural effusion. Concern for filling defect within the left lower lobe pulmonary artery and pulmonary embolus. Coronary artery and aortic calcifications. Hepatobiliary: Cholecystostomy tube within the gallbladder which is decompressed. New subcapsular fluid collections adjacent to the right lobe of the liver. The  largest is noted anteriorly and laterally measuring up to 17 cm. Posterior subcapsular fluid collection measures up to 6.2 cm. These appear to communicate inferiorly. Metallic biliary stent again noted, unchanged. Pneumobilia again noted. Pancreas: Large hypodense area again seen in the body of the pancreas with atrophy of the  pancreatic tail. Findings compatible with history of pancreatic cancer. Findings stable since recent study. Spleen: No focal abnormality.  Normal size. Adrenals/Urinary Tract: Stable right upper pole renal cys

## 2021-07-26 NOTE — Progress Notes (Signed)
ANTICOAGULATION CONSULT NOTE  ?Pharmacy Consult for Heparin ?Indication: pulmonary embolus and DVT ? ? ?Allergies  ?Allergen Reactions  ? Penicillins Hives and Other (See Comments)  ?  Tolerated Ceftriaxone 2023 ? ?Has patient had a PCN reaction causing immediate rash, facial/tongue/throat swelling, SOB or lightheadedness with hypotension: No ?Has patient had a PCN reaction causing severe rash involving mucus membranes or skin necrosis: No ?Has patient had a PCN reaction that required hospitalization No ?Has patient had a PCN reaction occurring within the last 10 years: No ?If all of the above answers are "NO", then may proceed with Cephalosporin use.  ? ? ?Patient Measurements: ?Height: '5\' 3"'$  (160 cm) ?Weight: 78.4 kg (172 lb 13.5 oz) ?IBW/kg (Calculated) : 52.4 ?Heparin Dosing Weight: 70 kg ? ?Vital Signs: ?Temp: 97.5 ?F (36.4 ?C) (05/11 8676) ?Temp Source: Oral (05/11 0844) ?BP: 134/80 (05/11 0844) ?Pulse Rate: 86 (05/11 0844) ? ?Labs: ?Recent Labs  ?  07/24/21 ?0433 07/24/21 ?0433 07/24/21 ?1306 07/25/21 ?0215 07/25/21 ?1809 07/26/21 ?0322 07/26/21 ?1424  ?HGB 11.1*  --   --  11.0*  --  10.6*  --   ?HCT 33.0*  --   --  33.5*  --  32.6*  --   ?PLT 267  --   --  286  --  232  --   ?LABPROT  --   --  15.3*  --   --   --   --   ?INR  --   --  1.2  --   --   --   --   ?HEPARINUNFRC  --    < >  --  0.47 0.10* 0.23* 0.27*  ?CREATININE 0.67  --   --  0.69  --  0.61  --   ? < > = values in this interval not displayed.  ? ? ? ?Estimated Creatinine Clearance: 57.5 mL/min (by C-G formula based on SCr of 0.61 mg/dL). ? ?Assessment: ?76 yr old female with new PE/DVT for heparin, now s/p CT guided catheter drainage of perihepatic abscess 07/25/21 AM. ? ?Heparin level subtherapeutic: 0.27, no issues with infusion or s/sx of bleeding per RN. Infusing through PIV and labs being drawn by IV team.  ? ?Goal of Therapy:  ?Heparin level 0.3-0.7 units/ml ?Monitor platelets by anticoagulation protocol: Yes ?  ?Plan:  ?Increase heparin  drip to 1500 units/hr  ?Recheck heparin level with am labs ?Daily heparin level, CBC while on heparin ?Monitor for s/sx of bleeding ? ?Thank you for involving pharmacy in this patient's care. ? ?Renold Genta, PharmD, BCPS ?Clinical Pharmacist ?Clinical phone for 07/26/2021 until 3p is [phone] ?07/26/2021 2:56 PM ? ?**Pharmacist phone directory can be found on [email or web address] listed under [place]** ? ? ? ? ?

## 2021-07-27 ENCOUNTER — Inpatient Hospital Stay (HOSPITAL_COMMUNITY): Payer: Medicare HMO

## 2021-07-27 DIAGNOSIS — K810 Acute cholecystitis: Secondary | ICD-10-CM | POA: Diagnosis not present

## 2021-07-27 LAB — CBC WITH DIFFERENTIAL/PLATELET
Abs Immature Granulocytes: 0.4 10*3/uL — ABNORMAL HIGH (ref 0.00–0.07)
Basophils Absolute: 0.2 10*3/uL — ABNORMAL HIGH (ref 0.0–0.1)
Basophils Relative: 1 %
Eosinophils Absolute: 0.4 10*3/uL (ref 0.0–0.5)
Eosinophils Relative: 2 %
HCT: 31.8 % — ABNORMAL LOW (ref 36.0–46.0)
Hemoglobin: 10.3 g/dL — ABNORMAL LOW (ref 12.0–15.0)
Lymphocytes Relative: 8 %
Lymphs Abs: 1.4 10*3/uL (ref 0.7–4.0)
MCH: 30.3 pg (ref 26.0–34.0)
MCHC: 32.4 g/dL (ref 30.0–36.0)
MCV: 93.5 fL (ref 80.0–100.0)
Metamyelocytes Relative: 1 %
Monocytes Absolute: 0.4 10*3/uL (ref 0.1–1.0)
Monocytes Relative: 2 %
Myelocytes: 1 %
Neutro Abs: 14.9 10*3/uL — ABNORMAL HIGH (ref 1.7–7.7)
Neutrophils Relative %: 85 %
Platelets: 214 10*3/uL (ref 150–400)
RBC: 3.4 MIL/uL — ABNORMAL LOW (ref 3.87–5.11)
RDW: 16.8 % — ABNORMAL HIGH (ref 11.5–15.5)
WBC: 17.5 10*3/uL — ABNORMAL HIGH (ref 4.0–10.5)
nRBC: 0 % (ref 0.0–0.2)
nRBC: 0 /100 WBC

## 2021-07-27 LAB — COMPREHENSIVE METABOLIC PANEL
ALT: 13 U/L (ref 0–44)
AST: 25 U/L (ref 15–41)
Albumin: 1.6 g/dL — ABNORMAL LOW (ref 3.5–5.0)
Alkaline Phosphatase: 171 U/L — ABNORMAL HIGH (ref 38–126)
Anion gap: 5 (ref 5–15)
BUN: 9 mg/dL (ref 8–23)
CO2: 22 mmol/L (ref 22–32)
Calcium: 7.3 mg/dL — ABNORMAL LOW (ref 8.9–10.3)
Chloride: 106 mmol/L (ref 98–111)
Creatinine, Ser: 0.6 mg/dL (ref 0.44–1.00)
GFR, Estimated: >60 mL/min (ref >60)
Glucose, Bld: 144 mg/dL — ABNORMAL HIGH (ref 70–99)
Potassium: 3.4 mmol/L — ABNORMAL LOW (ref 3.5–5.1)
Sodium: 133 mmol/L — ABNORMAL LOW (ref 135–145)
Total Bilirubin: 0.3 mg/dL (ref 0.3–1.2)
Total Protein: 4.7 g/dL — ABNORMAL LOW (ref 6.5–8.1)

## 2021-07-27 LAB — GLUCOSE, CAPILLARY
Glucose-Capillary: 141 mg/dL — ABNORMAL HIGH (ref 70–99)
Glucose-Capillary: 175 mg/dL — ABNORMAL HIGH (ref 70–99)
Glucose-Capillary: 165 mg/dL — ABNORMAL HIGH (ref 70–99)

## 2021-07-27 LAB — BODY FLUID CULTURE W GRAM STAIN: Culture: NO GROWTH

## 2021-07-27 LAB — MAGNESIUM: Magnesium: 1.9 mg/dL (ref 1.7–2.4)

## 2021-07-27 LAB — HEPARIN LEVEL (UNFRACTIONATED)
Heparin Unfractionated: 0.37 IU/mL (ref 0.30–0.70)
Heparin Unfractionated: 0.48 IU/mL (ref 0.30–0.70)

## 2021-07-27 NOTE — Progress Notes (Addendum)
? ?[facility] Infectious Diseases Follow Up Note ? ?Patient Identification: ?Patient Name: [name] MRN: [id number] [place] Date: 07/18/2021  2:47 PM ?Age: 76 y.o.Today's Date: 07/27/2021 ? ?Reason for Visit: Bacteremia/abscess ? ?Principal Problem: ?  Acute cholecystitis ?Active Problems: ?  Acquired hypothyroidism ?  GAD (generalized anxiety disorder) ?  Diet-controlled diabetes mellitus ([place]) ?  Acute metabolic encephalopathy ?  Hyponatremia ?  Cholecystitis ?  Sepsis ([place]) ?  Goals of care, counseling/discussion ? ?Antibiotics:  ?Vancomycin 5/3 ?Cefepime 5/3, 5/6-c ?Aztreonam 5/3, Ceftriaxone 5/4-5/6 ?Metronidazole 5/3-c ?  ?Lines/Hardware: Rt chest port, GI stent, RT UQ drain ? ?Interval Events: Remains afebrile, leukocytosis is stable ? ? ?Assessment ?# Subcapsular fluid collection adjacent to the liver.  17.6 cm ?# Moderate-sized right pleural effusion with atelectasis in the right lower lobe ?- s/p rt sided thoracentesis 5/9. Cx no growth  ?- S/p CT guided drainage of perihepatic fluid 5/10. Cx no growth ? ? 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  ?# Acute cholecystitis S/p cholecystostomy by IR 5/5. Cx with Morganella morganii/B ovatus and E coli  ?  ?# Leukocytosis - stable  ?  ?# Pancreatic ca on chemotherapy ( last chemo gemcitabine/abraxane 4/24) - Oncology following  ?  ?# DM ( a1c 6.3) ?  ?Recommendations ?Continue cefepime and metronidazole ?Follow-up CBC and pending cultures ?Monitor drain output  ?Drain management and repeat imaging per IR ?Discussed with her about IV cefepime through port + metronidazole for vs PO bactrim + metronidazole for discharge.  She prefers to use her port for IV abtx and says she was told there is no restriction for using her port. ?Will plan for IV cefepime and po metronidazole for 2-3 weeks when ready to be discharged.  ?Fu Dispo for SNF placement ?Fu in the ID clinic will be arranged  ? ?Rest of the  management as per the primary team. ?Thank you for the consult. Please page with pertinent questions or concerns. ? ?______________________________________________________________________ ?Subjective ?patient seen and examined at the bedside.  Husband at bedside ?Abdominal pain present but better ?Mild nausea but denies vomiting ? ?Vitals ?BP 137/84 (BP Location: Right Arm)   Pulse 81   Temp 97.6 ?F (36.4 ?C) (Oral)   Resp 18   Ht '5\' 3"'$  (1.6 m)   Wt 78.4 kg   SpO2 98%   BMI 30.62 kg/m?  ? ?  ?Physical Exam ?Constitutional: Lying in the bed ?   Comments:  ? ?Cardiovascular:  ?   Rate and Rhythm: Normal rate and regular rhythm.  ?   Heart sounds:  ? ?Pulmonary:  ?   Effort: Pulmonary effort is normal on room air  ?   Comments:  ? ?Abdominal:  ?   Palpations: Abdomen is soft.  ?   Tenderness: mild abdominal tenderness, no RT and 2 drainage catheters in place ? ?Musculoskeletal:     ?   General: No swelling or tenderness.  ? ?Skin: ?   Comments:  ? ?Neurological:  ?   General: Grossly nonfocal, awake alert and oriented ? ?Psychiatric:     ?   Mood and Affect: Mood normal.  ? ?Pertinent Microbiology ?Results for orders placed or performed during the hospital encounter of 07/18/21  ?Resp Panel by RT-PCR (Flu A&B, Covid) Nasopharyngeal Swab     Status: Abnormal  ? Collection Time: 07/18/21  2:47 PM  ? Specimen: Nasopharyngeal Swab; Nasopharyngeal(NP) swabs in vial transport medium  ?Result Value Ref Range Status  ? SARS Coronavirus 2 by  RT PCR POSITIVE (A) NEGATIVE Final  ?  Comment: (NOTE) ?SARS-CoV-2 target nucleic acids are DETECTED. ? ?The SARS-CoV-2 RNA is generally detectable in upper respiratory ?specimens during the acute phase of infection. Positive results are ?indicative of the presence of the identified virus, but do not rule ?out bacterial infection or co-infection with other pathogens not ?detected by the test. Clinical correlation with patient history and ?other diagnostic information is necessary to  determine patient ?infection status. The expected result is Negative. ? ?Fact Sheet for Patients: ?EntrepreneurPulse.com.au ? ?Fact Sheet for Healthcare Providers: ?IncredibleEmployment.be ? ?This test is not yet approved or cleared by the [place] FDA and  ?has been authorized for detection and/or diagnosis of SARS-CoV-2 by ?FDA under an Emergency Use Authorization (EUA).  This EUA will ?remain in effect (meaning this test can be used) for the duration of  ?the COVID-19 declaration under Section 564(b)(1) of the A ct, 21 ?U.S.C. section 360bbb-3(b)(1), unless the authorization is ?terminated or revoked sooner. ? ?  ? Influenza A by PCR NEGATIVE NEGATIVE Final  ? Influenza B by PCR NEGATIVE NEGATIVE Final  ?  Comment: (NOTE) ?The Xpert Xpress SARS-CoV-2/FLU/RSV plus assay is intended as an aid ?in the diagnosis of influenza from Nasopharyngeal swab specimens and ?should not be used as a sole basis for treatment. Nasal washings and ?aspirates are unacceptable for Xpert Xpress SARS-CoV-2/FLU/RSV ?testing. ? ?Fact Sheet for Patients: ?EntrepreneurPulse.com.au ? ?Fact Sheet for Healthcare Providers: ?IncredibleEmployment.be ? ?This test is not yet approved or cleared by the [place] FDA and ?has been authorized for detection and/or diagnosis of SARS-CoV-2 by ?FDA under an Emergency Use Authorization (EUA). This EUA will remain ?in effect (meaning this test can be used) for the duration of the ?COVID-19 declaration under Section 564(b)(1) of the Act, 21 U.S.C. ?section 360bbb-3(b)(1), unless the authorization is terminated or ?revoked. ? ?Performed at [facility], [place] [address], [place], [place] ?[address] ?  ?Culture, blood (Routine x 2)     Status: Abnormal  ? Collection Time: 07/18/21  3:03 PM  ? Specimen: BLOOD LEFT HAND  ?Result Value Ref Range Status  ? Specimen Description BLOOD LEFT HAND  Final  ? Special Requests   Final  ?   BOTTLES DRAWN AEROBIC AND ANAEROBIC Blood Culture results may not be optimal due to an inadequate volume of blood received in culture bottles  ? Culture  Setup Time   Final  ?  GRAM NEGATIVE RODS ?ANAEROBIC BOTTLE ONLY ?CRITICAL VALUE NOTED.  VALUE IS CONSISTENT WITH PREVIOUSLY REPORTED AND CALLED VALUE. ?IN BOTH AEROBIC AND ANAEROBIC BOTTLES ?  ? Culture (A)  Final  ?  MORGANELLA MORGANII ?CRITICAL RESULT CALLED TO, READ BACK BY AND VERIFIED WITH: PHARMD G.BARR AT 1221 ON 07/21/2021 BY T.SAAD. ?Performed at [facility], [place] [address] [place], [place] [address] ?  ? Report Status 07/23/2021 FINAL  Final  ? Organism ID, Bacteria MORGANELLA MORGANII  Final  ?    Susceptibility  ? Morganella morganii - MIC*  ?  AMPICILLIN >=32 RESISTANT Resistant   ?  CEFAZOLIN >=64 RESISTANT Resistant   ?  CEFTAZIDIME <=1 SENSITIVE Sensitive   ?  CIPROFLOXACIN <=0.25 SENSITIVE Sensitive   ?  GENTAMICIN <=1 SENSITIVE Sensitive   ?  IMIPENEM 8 INTERMEDIATE Intermediate   ?  TRIMETH/SULFA <=20 SENSITIVE Sensitive   ?  AMPICILLIN/SULBACTAM >=32 RESISTANT Resistant   ?  PIP/TAZO <=4 SENSITIVE Sensitive   ?  * MORGANELLA MORGANII  ?Culture, blood (Routine  x 2)     Status: Abnormal  ? Collection Time: 07/18/21  3:07 PM  ? Specimen: BLOOD RIGHT FOREARM  ?Result Value Ref Range Status  ? Specimen Description BLOOD RIGHT FOREARM  Final  ? Special Requests   Final  ?  BOTTLES DRAWN AEROBIC AND ANAEROBIC Blood Culture adequate volume  ? Culture  Setup Time   Final  ?  GRAM NEGATIVE RODS ?ANAEROBIC BOTTLE ONLY ?IN BOTH AEROBIC AND ANAEROBIC BOTTLES ?CRITICAL RESULT CALLED TO, READ BACK BY AND VERIFIED WITH: ?PHARMD GREG ABBOTT 07/19/21'@6'$ :10 BY TW ?  ? Culture (A)  Final  ?  ESCHERICHIA COLI ?MORGANELLA MORGANII ?SUSCEPTIBILITIES PERFORMED ON PREVIOUS CULTURE WITHIN THE LAST 5 DAYS. ?Performed at [facility], [place] [address], [place], [place] [address] ?  ? Report Status 07/23/2021 FINAL  Final  ? Organism ID, Bacteria ESCHERICHIA COLI   Final  ?    Susceptibility  ? Escherichia coli - MIC*  ?  AMPICILLIN 8 SENSITIVE Sensitive   ?  CEFAZOLIN <=4 SENSITIVE Sensitive   ?  CEFEPIME <=0.12 SENSITIVE Sensitive   ?  CEFTAZIDIME <=1 SENSITIVE Sensitiv

## 2021-07-27 NOTE — Progress Notes (Signed)
ANTICOAGULATION CONSULT NOTE  ?Pharmacy Consult for Heparin ?Indication: pulmonary embolus and DVT ? ? ?Allergies  ?Allergen Reactions  ? Penicillins Hives and Other (See Comments)  ?  Tolerated Ceftriaxone 2023 ? ?Has patient had a PCN reaction causing immediate rash, facial/tongue/throat swelling, SOB or lightheadedness with hypotension: No ?Has patient had a PCN reaction causing severe rash involving mucus membranes or skin necrosis: No ?Has patient had a PCN reaction that required hospitalization No ?Has patient had a PCN reaction occurring within the last 10 years: No ?If all of the above answers are "NO", then may proceed with Cephalosporin use.  ? ? ?Patient Measurements: ?Height: '5\' 3"'$  (160 cm) ?Weight: 78.4 kg (172 lb 13.5 oz) ?IBW/kg (Calculated) : 52.4 ?Heparin Dosing Weight: 70 kg ? ?Vital Signs: ?Temp: 98.6 ?F (37 ?C) (05/12 0272) ?Temp Source: Oral (05/12 5366) ?BP: 136/82 (05/12 0836) ?Pulse Rate: 88 (05/12 0836) ? ?Labs: ?Recent Labs  ?  07/25/21 ?0215 07/25/21 ?1809 07/26/21 ?0322 07/26/21 ?1424 07/27/21 ?0329 07/27/21 ?1418  ?HGB 11.0*  --  10.6*  --  10.3*  --   ?HCT 33.5*  --  32.6*  --  31.8*  --   ?PLT 286  --  232  --  214  --   ?HEPARINUNFRC 0.47   < > 0.23* 0.27* 0.37 0.48  ?CREATININE 0.69  --  0.61  --  0.60  --   ? < > = values in this interval not displayed.  ? ? ? ?Estimated Creatinine Clearance: 57.5 mL/min (by C-G formula based on SCr of 0.6 mg/dL). ? ?Assessment: ?76 yr old female with new PE/DVT for heparin, now s/p CT guided catheter drainage of perihepatic abscess 07/25/21 AM. ? ?Heparin level therapeutic at 0.48 on 1600 units/hr. Infusing through PIV and labs being drawn by IV team. No bleeding noted, CBC is stable. ? ?Goal of Therapy:  ?Heparin level 0.3-0.7 units/ml ?Monitor platelets by anticoagulation protocol: Yes ?  ?Plan:  ?Continue heparin drip at 1600 units/hr  ?Daily heparin level, CBC while on heparin ?Monitor for s/sx of bleeding ? ?Thank you for involving pharmacy in  this patient's care. ? ?Renold Genta, PharmD, BCPS ?Clinical Pharmacist ?Clinical phone for 07/27/2021 until 3p is [phone] ?07/27/2021 3:04 PM ? ?**Pharmacist phone directory can be found on [email or web address] listed under [place]** ? ? ? ? ?

## 2021-07-27 NOTE — Progress Notes (Signed)
ANTICOAGULATION CONSULT NOTE - Follow Up Consult ? ?Pharmacy Consult for heparin ?Indication:  PE/DVT ? ?Labs: ?Recent Labs  ?  07/24/21 ?1306 07/25/21 ?0215 07/25/21 ?1809 07/26/21 ?0322 07/26/21 ?1424 07/27/21 ?0329  ?HGB  --  11.0*  --  10.6*  --  10.3*  ?HCT  --  33.5*  --  32.6*  --  31.8*  ?PLT  --  286  --  232  --  214  ?LABPROT 15.3*  --   --   --   --   --   ?INR 1.2  --   --   --   --   --   ?HEPARINUNFRC  --  0.47   < > 0.23* 0.27* 0.37  ?CREATININE  --  0.69  --  0.61  --  0.60  ? < > = values in this interval not displayed.  ? ? ?Assessment: ?76yo female therapeutic on heparin after rate change but at low end of goal, would prefer higher level for acute PE/DVT; no infusion issues or signs of bleeding per RN. ? ?Goal of Therapy:  ?Heparin level 0.3-0.7 units/ml ?  ?Plan:  ?Will increase heparin infusion slightly to 1600 units/hr and check level in 8 hours.   ? ?Wynona Neat, PharmD, BCPS  ?07/27/2021,4:26 AM ? ? ?

## 2021-07-27 NOTE — TOC Progression Note (Addendum)
Transition of Care (TOC) - Progression Note  ? ? ?Patient Details  ?Name: [name] ?MRN: [id number] ?Date of Birth: 11-15-1945 ? ?Transition of Care (TOC) CM/SW Contact  ?Emeterio Reeve, LCSW ?Phone Number: ?07/27/2021, 1:40 PM ? ?Clinical Narrative:    ? ?CSW expanded Snf search per families request. Pt has no new bed offers. CSW called pts daughter [name], but was unable to leave voicemail due to being full. ? ?Pt is managed by regular [facility].   ? ?Expected Discharge Plan: [place] ?Barriers to Discharge: Continued Medical Work up, Ship broker ? ?Expected Discharge Plan and Services ?Expected Discharge Plan: [place] ?  ?  ?  ?Living arrangements for the past 2 months: [place] ?                ?  ?  ?  ?  ?  ?  ?  ?  ?  ?  ? ? ?Social Determinants of Health (SDOH) Interventions ?  ? ?Readmission Risk Interventions ?   ? View : No data to display.  ?  ?  ?  ? ?Emeterio Reeve, LCSW ?Clinical Social Worker ? ?

## 2021-07-27 NOTE — Progress Notes (Signed)
Physical Therapy Treatment ?Patient Details ?Name: [name] ?MRN: [id number] ?DOB: 1945-11-28 ?Today's Date: 07/27/2021 ? ? ?History of Present Illness 76 y.o. female s/p cholecystostomy by IR 5/5 with [name] bacteremia.  Has dx also UTI sepsis. PMH 06/30/21 COVID-19 infection diagnosed 06/20/21 with COVID PNA.  T2DM, HLD, HTN, hx of TIA, depression/anxiety, metastatic pancreatic cancer currently undergoing chemotherapy (last infusion 06/11/21) ? ?  ?PT Comments  ? ? Pt received supine and agreeable to session with great participation and slow but steady progress towards goals. Pt requiring up to mod a for bed mobility to elevate trunk from sidelying. Pt able to come to standing x3 throughout session with min assist to power up from bed and mod to power up from lower recliner. Once standing pt able to march in place despite c/o of her feet feeling very heavy, pt demonstrated ability to ambulate with min a for RW management and for stability. Pt with fair tolerance for LE therex. Pt verbalized understating of education re; continued mobility and mobility progression. Pt continues to benefit from skilled PT services to progress toward functional mobility goals.  ?  ?Recommendations for follow up therapy are one component of a multi-disciplinary discharge planning process, led by the attending physician.  Recommendations may be updated based on patient status, additional functional criteria and insurance authorization. ? ?Follow Up Recommendations ? Skilled nursing-short term rehab (<3 hours/day) ?  ?  ?Assistance Recommended at Discharge Frequent or constant Supervision/Assistance  ?Patient can return home with the following A little help with walking and/or transfers;A little help with bathing/dressing/bathroom;Assistance with cooking/housework;Direct supervision/assist for medications management;Direct supervision/assist for financial management;Assist for transportation;Help with stairs or ramp  for entrance ?  ?Equipment Recommendations ? None recommended by PT  ?  ?Recommendations for Other Services   ? ? ?  ?Precautions / Restrictions Precautions ?Precautions: Fall ?Precaution Comments: R flank Drain ?Restrictions ?Weight Bearing Restrictions: No ?Other Position/Activity Restrictions: pt has unpredictable tendency to lean back on the bed  ?  ? ?Mobility ? Bed Mobility ?Overal bed mobility: Needs Assistance ?Bed Mobility: Rolling, Sidelying to Sit ?Rolling: Min assist ?Sidelying to sit: Mod assist ?  ?  ?  ?General bed mobility comments: mod a to elevate trunk ?  ? ?Transfers ?Overall transfer level: Needs assistance ?Equipment used: Rolling walker (2 wheels) ?Transfers: Sit to/from Stand ?Sit to Stand: Mod assist, Min assist ?  ?  ?  ?  ?  ?General transfer comment: min a from EOB mod a towards end of session as pt fatigues ?  ? ?Ambulation/Gait ?Ambulation/Gait assistance: Min guard ?Gait Distance (Feet): 5 Feet ?Assistive device: Rolling walker (2 wheels) ?Gait Pattern/deviations: Step-through pattern, Decreased stride length, Shuffle, Trunk flexed, Knee flexed in stance - right, Knee flexed in stance - left ?Gait velocity: reduced ?  ?  ?General Gait Details: slow guarded gait, pt with c/o that LE feel heavy, shuffling feet ? ? ?Stairs ?  ?  ?  ?  ?  ? ? ?Wheelchair Mobility ?  ? ?Modified Rankin (Stroke Patients Only) ?  ? ? ?  ?Balance Overall balance assessment: Mild deficits observed, not formally tested ?  ?  ?  ?  ?  ?  ?  ?  ?  ?  ?  ?  ?  ?  ?  ?  ?  ?  ?  ? ?  ?Cognition Arousal/Alertness: Awake/alert ?Behavior During Therapy: Flat affect ?Overall Cognitive Status: Impaired/Different from baseline ?  ?  ?  ?  ?  ?  ?  ?  ?  ?  ?  ?  ?  ?  ?  ?  ?  ?  ?  ? ?  ?  Exercises General Exercises - Lower Extremity ?Ankle Circles/Pumps: AROM, Right, Left, 20 reps ?Long Arc Quad: Right, Left, 10 reps ?Hip ABduction/ADduction: AROM, Right, Left, 20 reps ?Hip Flexion/Marching: Right, Both, 20 reps, Seated,  Standing ? ?  ?General Comments   ?  ?  ? ?Pertinent Vitals/Pain Pain Assessment ?Pain Assessment: Faces ?Faces Pain Scale: Hurts little more ?Pain Location: abdomen ?Pain Descriptors / Indicators: Discomfort, Guarding, Sore ?Pain Intervention(s): Limited activity within patient's tolerance, Monitored during session, Repositioned  ? ? ?Home Living   ?  ?  ?  ?  ?  ?  ?  ?  ?  ?   ?  ?Prior Function    ?  ?  ?   ? ?PT Goals (current goals can now be found in the care plan section) Acute Rehab PT Goals ?Patient Stated Goal: to get stronger ?PT Goal Formulation: With patient/family ?Time For Goal Achievement: 08/06/21 ? ?  ?Frequency ? ? ? Min 2X/week ? ? ? ?  ?PT Plan Current plan remains appropriate  ? ? ?Co-evaluation   ?  ?  ?  ?  ? ?  ?AM-PAC PT "6 Clicks" Mobility   ?Outcome Measure ? Help needed turning from your back to your side while in a flat bed without using bedrails?: None ?Help needed moving from lying on your back to sitting on the side of a flat bed without using bedrails?: A Little ?Help needed moving to and from a bed to a chair (including a wheelchair)?: A Little ?Help needed standing up from a chair using your arms (e.g., wheelchair or bedside chair)?: A Little ?Help needed to walk in hospital room?: A Lot ?Help needed climbing 3-5 steps with a railing? : Total ?6 Click Score: 16 ? ?  ?End of Session   ?Activity Tolerance: Patient tolerated treatment well ?Patient left: in chair;with call bell/phone within reach ?Nurse Communication: Mobility status ?PT Visit Diagnosis: Unsteadiness on feet (R26.81);Muscle weakness (generalized) (M62.81);Difficulty in walking, not elsewhere classified (R26.2) ?  ? ? ?Time: 6270-3500 ?PT Time Calculation (min) (ACUTE ONLY): 30 min ? ?Charges:  $Gait Training: 8-22 mins ?$Therapeutic Activity: 8-22 mins          ?          ? ?[name]. PTA ?Acute Rehabilitation Services ?Office: [phone] ? ? ? ?[name] ?07/27/2021, 2:19 PM ? ?

## 2021-07-27 NOTE — Progress Notes (Signed)
?PROGRESS NOTE ? ? ? ?[name]  [name]:[id number] DOB: [birth year] DOA: 07/18/2021 ?PCP: [name], MD  ? ? ?Brief Narrative:  ? ?[name] is a 76 year old female with past medical history significant for pancreatic cancer on chemotherapy, type 2 diabetes mellitus, hypothyroidism, GERD who presented to [facility] ED on 5/3 via EMS with confusion and fever.  Patient also has been complaining of generalized abdominal pain with intermittent nausea.  ? ?In the ED, sodium 129, potassium 3.6, chloride 102, CO2 16, glucose 191, BUN 11, creatinine 0.77.  Lactic acid 1.6.  WBC 7.7, hemoglobin 10.9.  TSH 2.251.  CT abdomen/pelvis with diffuse gallbladder wall thickening and edema with apparent area of discontinuity posterior aspect of the distal gallbladder wall with concern for gallbladder rupture or perforation, small ascites, sigmoid diverticulosis, ill-defined known pancreatic mass encasing the portal vein and portal splenic confluence with collateralization, several small bilateral pulmonary nodules, hypodense lesion inferior right lobe.  General surgery was consulted.  Hospital service consulted for further evaluation and management of altered mental status and concern for acute cholecystitis. ? ?Assessment & Plan: ?  ?Acute metabolic encephalopathy ?Etiology likely secondary to severe sepsis with bacteremia/UTI and acute cholecystitis.  Now resolved and appears to be at baseline. ?--Supportive care and treatment as below ? ?Severe sepsis, POA ?Morganella morganii/E. coli bacteremia ?E. coli/Enterococcus faecium UTI ?Acute cholecystitis s/p cholecystostomy ?Patient presenting to ED with confusion and found to have concerns for acute cholecystitis on CT abdomen/pelvis.  Initially started on empiric antibiotics with vancomycin, metronidazole, cefepime/aztreonam.  Evaluated by general surgery with recommendations of IR placement of cholecystostomy.  Underwent IR cholecystostomy placement on 07/20/2021 with culture  positive for E. coli, Morganella morganii, Bacteroides ovatus. Repeat CT abdomen/pelvis notable for new subcapsular fluid collection near the liver spanning 17 cm. ?--WBC 7.7>>24.7>20.2>21.5>17.2 ?--Drain culture 5/10: No organisms on Gram stain, no growth <24h ?--RUQ drain w/ 78m output last 24h ?--JP drain with 1252moutput last 24h ?--Cefepime 2 g IV every 12 hours ?--Metronidazole 500 mg PO q12h ?--CBC daily ?--Continue monitor drain output ?--Await further recommendations per ID ? ?Left lower lobe pulmonary embolism ?CTA chest 5/9 with bilateral segmental and subsegmental pulmonary emboli.  Vascular duplex ultrasound bilateral lower extremities 5/9 with findings consistent with acute DVT right common femoral vein, acute DVT left common femoral vein and left proximal profunda vein; common femoral vein obstruction does not appear to extend above inguinal ligament. ?--Continue heparin drip, anticipate transition to apixaban on discharge ? ?Right pleural effusion ?Underwent IR thoracentesis on 07/24/2021 with removal of 350 mL clear yellow fluid.  Positive lights criteria consistent with exudative pleural effusion.  Pleural fluid cytology negative for malignant cells.  Repeat chest x-ray 5/12 shows improvement in the right-sided pleural effusion with decreased atelectasis. ?--Pleural fluid culture: No growth x 3 days ? ?Type 2 diabetes mellitus ?Hemoglobin A1c 6.3 on 07/10/2021, well controlled. ?--SSI for coverage ?--CBGs qAC/HS ? ?Pancreatic cancer ?Follows with medical oncology outpatient, Dr. ShBenay [name]? ?Hypothyroidism ?--Levothyroxine 100 mcg p.o. daily ? ?Generalized anxiety disorder ?-- Alprazolam 0.25 PO daily PRN ? ?Essential hypertension ?--Amlodipine 5 mg p.o. daily ? ?Hypomagnesemia ?Repleted. ?--Magnesium level in a.m. ? ?Hypokalemia ?Potassium 3.2 this morning, will replete. ?--Repeat BMP in a.m. to include magnesium ? ?Hx COVID-19 viral infection 06/20/2021 ?Oxygenating well on room  air. ? ?Ethics: ?Given patient's multiple comorbidities, debility and clinical decline, palliative care was consulted for assistance with goals of care and medical decision making.  Seen by palliative care on 5/9 and  recommendations are to continue with full code/full scope of treatment.  Outpatient palliative care to follow-up at cancer center arranged following discharge. ? ?Physical deconditioning: ?--Continue PT/OT efforts while inpatient ?--Pending SNF placement ? ? ?DVT prophylaxis: SCDs Start: 07/18/21 1925 ? ?  Code Status: Full Code ?Family Communication: No family present at bedside this morning, updated patient's spouse and daughter extensively yesterday afternoon. ? ?Disposition Plan:  ?Level of care: Med-Surg ?Status is: Inpatient ?Remains inpatient appropriate because: awaiting further recommendations regarding antibiotic treatment/duration per infectious disease depended on culture results, anticipate discharge to SNF 2-3 days ?  ? ?Consultants:  ?General surgery, signed off 5/6 ?Infectious disease ?Palliative care ?Interventional radiology ? ?Procedures:  ?IR cholecystostomy placement ?IR drain placement 5/10 ? ?Antimicrobials:  ?Vancomycin 5/3 - 5/3 ?Aztreonam 5/3 - 5/3 ?Ceftriaxone 5/4 - 5/6 ?Cefepime 5/3, 5/6>> ?Metronidazole 5/3>> ? ? ?Subjective: ?Patient seen examined bedside, resting currently.  No family present this morning.  Discussed needs to continue to mobilize with PT to ensure she does not have any further deconditioning.  Patient requesting repeat chest x-ray which showed continued improvement of right-sided pleural effusion with decreased atelectasis.  Continues to complain of discomfort at tube sites and will want to know when they will be coming out.  Discussed with patient that her cholecystostomy tube will likely will remain for some time and abdominal drain will remain until follows up with IR clinic outpatient in 10-14 days with decreased output.  No other questions or concerns  at this time.  Denies headache, no chest pain, no palpitations, no shortness of breath, no nausea/vomiting/diarrhea, no fever/chills/night sweats, no paresthesias, no fatigue.  No acute concerns overnight per nursing staff. ? ?Objective: ?Vitals:  ? 07/26/21 1735 07/26/21 2100 07/27/21 0523 07/27/21 0836  ?BP: (!) 146/86 136/80 137/84 136/82  ?Pulse: 87 84 81 88  ?Resp: '17 18 18 18  '$ ?Temp: 98.6 ?F (37 ?C) 98.1 ?F (36.7 ?C) 97.6 ?F (36.4 ?C) 98.6 ?F (37 ?C)  ?TempSrc: Oral Oral Oral Oral  ?SpO2: 97% 97% 98% 97%  ?Weight:      ?Height:      ? ? ?Intake/Output Summary (Last 24 hours) at 07/27/2021 1115 ?Last data filed at 07/27/2021 0542 ?Gross per 24 hour  ?Intake 720.11 ml  ?Output 960 ml  ?Net -239.89 ml  ? ?Filed Weights  ? 07/18/21 1511 07/21/21 0500 07/24/21 0601  ?Weight: 67.1 kg 67 kg 78.4 kg  ? ? ?Examination: ? ?Physical Exam: ?GEN: NAD, alert and oriented x 3, chronically ill in appearance ?HEENT: NCAT, PERRL, EOMI, sclera clear, MMM ?PULM: CTAB w/o wheezes/crackles, normal respiratory effort, on room air ?CV: RRR w/o M/G/R ?GI: abd soft, NTND, NABS, no R/G/M, abdominal drains x 2 noted ?MSK: no peripheral edema, muscle strength globally intact 5/5 bilateral upper/lower extremities ?NEURO: CN II-XII intact, no focal deficits, sensation to light touch intact ?PSYCH: Depressed mood, flat affect ?Integumentary: dry/intact, no rashes or wounds ? ? ? ?Data Reviewed: I have personally reviewed following labs and imaging studies ? ?CBC: ?Recent Labs  ?Lab 07/23/21 ?1252 07/24/21 ?7035 07/25/21 ?0215 07/26/21 ?0093 07/27/21 ?8182  ?WBC 24.7* 20.2* 21.5* 17.2* 17.5*  ?NEUTROABS 18.1* 14.5* 14.8* 15.8* 14.9*  ?HGB 11.9* 11.1* 11.0* 10.6* 10.3*  ?HCT 35.7* 33.0* 33.5* 32.6* 31.8*  ?MCV 93.2 91.9 93.1 92.6 93.5  ?PLT 316 267 286 232 214  ? ?Basic Metabolic Panel: ?Recent Labs  ?Lab 07/23/21 ?1252 07/24/21 ?9937 07/25/21 ?0215 07/26/21 ?1696 07/27/21 ?7893  ?NA 134* 135 135 134* 133*  ?K  3.5 3.5 3.3* 3.2* 3.4*  ?CL 109 110  110 108 106  ?CO2 15* 18* 18* 22 22  ?GLUCOSE 196* 151* 136* 152* 144*  ?BUN '12 9 9 8 9  '$ ?CREATININE 0.65 0.67 0.69 0.61 0.60  ?CALCIUM 7.7* 7.7* 7.7* 7.4* 7.3*  ?MG  --   --   --  1.9 1.9  ? ?GFR: ?Estimated Creatinin

## 2021-07-28 DIAGNOSIS — K810 Acute cholecystitis: Secondary | ICD-10-CM | POA: Diagnosis not present

## 2021-07-28 LAB — CBC WITH DIFFERENTIAL/PLATELET
Abs Immature Granulocytes: 0 10*3/uL (ref 0.00–0.07)
Basophils Absolute: 0 10*3/uL (ref 0.0–0.1)
Basophils Relative: 0 %
Eosinophils Absolute: 0.4 10*3/uL (ref 0.0–0.5)
Eosinophils Relative: 2 %
HCT: 32.6 % — ABNORMAL LOW (ref 36.0–46.0)
Hemoglobin: 10.3 g/dL — ABNORMAL LOW (ref 12.0–15.0)
Lymphocytes Relative: 3 %
Lymphs Abs: 0.6 10*3/uL — ABNORMAL LOW (ref 0.7–4.0)
MCH: 30.2 pg (ref 26.0–34.0)
MCHC: 31.6 g/dL (ref 30.0–36.0)
MCV: 95.6 fL (ref 80.0–100.0)
Monocytes Absolute: 0.2 10*3/uL (ref 0.1–1.0)
Monocytes Relative: 1 %
Neutro Abs: 19.5 10*3/uL — ABNORMAL HIGH (ref 1.7–7.7)
Neutrophils Relative %: 94 %
Platelets: 182 10*3/uL (ref 150–400)
RBC: 3.41 MIL/uL — ABNORMAL LOW (ref 3.87–5.11)
RDW: 17.5 % — ABNORMAL HIGH (ref 11.5–15.5)
WBC: 20.7 10*3/uL — ABNORMAL HIGH (ref 4.0–10.5)
nRBC: 0 % (ref 0.0–0.2)
nRBC: 0 /100 WBC

## 2021-07-28 LAB — BASIC METABOLIC PANEL
Anion gap: 5 (ref 5–15)
BUN: 10 mg/dL (ref 8–23)
CO2: 19 mmol/L — ABNORMAL LOW (ref 22–32)
Calcium: 7.4 mg/dL — ABNORMAL LOW (ref 8.9–10.3)
Chloride: 108 mmol/L (ref 98–111)
Creatinine, Ser: 0.54 mg/dL (ref 0.44–1.00)
GFR, Estimated: >60 mL/min (ref >60)
Glucose, Bld: 132 mg/dL — ABNORMAL HIGH (ref 70–99)
Potassium: 3.5 mmol/L (ref 3.5–5.1)
Sodium: 132 mmol/L — ABNORMAL LOW (ref 135–145)

## 2021-07-28 LAB — GLUCOSE, CAPILLARY
Glucose-Capillary: 139 mg/dL — ABNORMAL HIGH (ref 70–99)
Glucose-Capillary: 142 mg/dL — ABNORMAL HIGH (ref 70–99)
Glucose-Capillary: 155 mg/dL — ABNORMAL HIGH (ref 70–99)
Glucose-Capillary: 158 mg/dL — ABNORMAL HIGH (ref 70–99)

## 2021-07-28 LAB — HEPARIN LEVEL (UNFRACTIONATED): Heparin Unfractionated: 0.52 IU/mL (ref 0.30–0.70)

## 2021-07-28 LAB — MAGNESIUM: Magnesium: 1.7 mg/dL (ref 1.7–2.4)

## 2021-07-28 MED ORDER — MAGNESIUM SULFATE 2 GM/50ML IV SOLN
2.0000 g | Freq: Once | INTRAVENOUS | Status: AC
  Administered 2021-07-28: 2 g via INTRAVENOUS
  Filled 2021-07-28: qty 50

## 2021-07-28 MED ORDER — LOPERAMIDE HCL 2 MG PO CAPS
2.0000 mg | ORAL_CAPSULE | ORAL | Status: DC | PRN
  Filled 2021-07-28: qty 1

## 2021-07-28 NOTE — Progress Notes (Signed)
?PROGRESS NOTE ? ? ? ?[name]  QPY:[id number] DOB: 01/26/1946 DOA: 07/18/2021 ?PCP: [name], MD  ? ? ?Brief Narrative:  ? ?[name] is a 76 year old female with past medical history significant for pancreatic cancer on chemotherapy, type 2 diabetes mellitus, hypothyroidism, GERD who presented to [facility] ED on 5/3 via EMS with confusion and fever.  Patient also has been complaining of generalized abdominal pain with intermittent nausea.  ? ?In the ED, sodium 129, potassium 3.6, chloride 102, CO2 16, glucose 191, BUN 11, creatinine 0.77.  Lactic acid 1.6.  WBC 7.7, hemoglobin 10.9.  TSH 2.251.  CT abdomen/pelvis with diffuse gallbladder wall thickening and edema with apparent area of discontinuity posterior aspect of the distal gallbladder wall with concern for gallbladder rupture or perforation, small ascites, sigmoid diverticulosis, ill-defined known pancreatic mass encasing the portal vein and portal splenic confluence with collateralization, several small bilateral pulmonary nodules, hypodense lesion inferior right lobe.  General surgery was consulted.  Hospital service consulted for further evaluation and management of altered mental status and concern for acute cholecystitis. ? ?Assessment & Plan: ?  ?Acute metabolic encephalopathy ?Etiology likely secondary to severe sepsis with bacteremia/UTI and acute cholecystitis.  Now resolved and appears to be at baseline. ?--Supportive care and treatment as below ? ?Severe sepsis, POA ?Morganella morganii/E. coli bacteremia ?E. coli/Enterococcus faecium UTI ?Acute cholecystitis s/p cholecystostomy ?Patient presenting to ED with confusion and found to have concerns for acute cholecystitis on CT abdomen/pelvis.  Initially started on empiric antibiotics with vancomycin, metronidazole, cefepime/aztreonam.  Evaluated by general surgery with recommendations of IR placement of cholecystostomy.  Underwent IR cholecystostomy placement on 07/20/2021 with culture  positive for E. coli, Morganella morganii, Bacteroides ovatus. Repeat CT abdomen/pelvis notable for new subcapsular fluid collection near the liver spanning 17 cm. ?--Infectious disease following, appreciate assistance ?--WBC 7.7>>24.7>20.2>21.5>17.2>17.5>20.7 ?--Drain culture 5/10: No organisms on Gram stain, no growth x 2 days ?--RUQ drain w/ 97m output last 24h ?--JP drain with 934moutput last 24h ?--Cefepime 2 g IV every 12 hours ?--Metronidazole 500 mg PO q12h ?--CBC daily ?--Continue monitor drain output ?--ID recommending 2-3 weeks cefepime/metronidazole ? ?Left lower lobe pulmonary embolism ?CTA chest 5/9 with bilateral segmental and subsegmental pulmonary emboli.  Vascular duplex ultrasound bilateral lower extremities 5/9 with findings consistent with acute DVT right common femoral vein, acute DVT left common femoral vein and left proximal profunda vein; common femoral vein obstruction does not appear to extend above inguinal ligament. ?--Continue heparin drip, anticipate transition to apixaban on discharge ? ?Right pleural effusion ?Underwent IR thoracentesis on 07/24/2021 with removal of 350 mL clear yellow fluid.  Positive lights criteria consistent with exudative pleural effusion.  Pleural fluid cytology negative for malignant cells.  Repeat chest x-ray 5/12 shows improvement in the right-sided pleural effusion with decreased atelectasis. ?--Pleural fluid culture: No growth x 3 days ? ?Type 2 diabetes mellitus ?Hemoglobin A1c 6.3 on 07/10/2021, well controlled. ?--SSI for coverage ?--CBGs qAC/HS ? ?Pancreatic cancer ?Follows with medical oncology outpatient, Dr. ShBenay Spice? ?Hypothyroidism ?--Levothyroxine 100 mcg p.o. daily ? ?Generalized anxiety disorder ?--Alprazolam 0.25 PO daily PRN ? ?Essential hypertension ?--Amlodipine 5 mg p.o. daily ? ?Hypomagnesemia ?Repleted.  Magnesium 1.7 this morning, will continue to replete. ?--Magnesium level in a.m. ? ?Hypokalemia ?Potassium 3.2 this morning, will  replete. ?--Repeat BMP in a.m. to include magnesium ? ?Hx COVID-19 viral infection 06/20/2021 ?Oxygenating well on room air. ? ?Ethics: ?Given patient's multiple comorbidities, debility and clinical decline, palliative care was consulted for assistance with  goals of care and medical decision making.  Seen by palliative care on 5/9 and recommendations are to continue with full code/full scope of treatment.  Outpatient palliative care to follow-up at cancer center arranged following discharge. ? ?Physical deconditioning: ?--Continue PT/OT efforts while inpatient ?--Pending SNF placement ? ? ?DVT prophylaxis: SCDs Start: 07/18/21 1925 ? ?  Code Status: Full Code ?Family Communication: Updated patient's spouse present at bedside this morning ? ?Disposition Plan:  ?Level of care: Med-Surg ?Status is: Inpatient ?Remains inpatient appropriate because: awaiting SNF placement per TOC ?  ? ?Consultants:  ?General surgery, signed off 5/6 ?Infectious disease ?Palliative care ?Interventional radiology ? ?Procedures:  ?IR cholecystostomy placement ?IR drain placement 5/10 ? ?Antimicrobials:  ?Vancomycin 5/3 - 5/3 ?Aztreonam 5/3 - 5/3 ?Ceftriaxone 5/4 - 5/6 ?Cefepime 5/3, 5/6>> ?Metronidazole 5/3>> ? ? ?Subjective: ?Patient seen examined bedside, resting currently.  Spouse present.  Reporting diarrhea overnight.  No other specific complaints this morning.  Continues with good output from the JP and right upper quadrant drain.  Remains afebrile. No other questions or concerns at this time.  Denies headache, no chest pain, no palpitations, no shortness of breath, no nausea/vomiting/diarrhea, no fever/chills/night sweats, no paresthesias, no fatigue.  No acute concerns overnight per nursing staff. ? ?Objective: ?Vitals:  ? 07/27/21 2231 07/28/21 0444 07/28/21 0500 07/28/21 0857  ?BP: 126/73 119/68  118/72  ?Pulse: 98 89  86  ?Resp: '17 17  18  '$ ?Temp: 98.1 ?F (36.7 ?C) 99 ?F (37.2 ?C)  98.2 ?F (36.8 ?C)  ?TempSrc: Oral Oral  Oral  ?SpO2:  97% 98%  98%  ?Weight:   78.8 kg   ?Height:      ? ? ?Intake/Output Summary (Last 24 hours) at 07/28/2021 1147 ?Last data filed at 07/28/2021 0700 ?Gross per 24 hour  ?Intake 516.73 ml  ?Output 275 ml  ?Net 241.73 ml  ? ?Filed Weights  ? 07/21/21 0500 07/24/21 0601 07/28/21 0500  ?Weight: 67 kg 78.4 kg 78.8 kg  ? ? ?Examination: ? ?Physical Exam: ?GEN: NAD, alert and oriented x 3, chronically ill in appearance ?HEENT: NCAT, PERRL, EOMI, sclera clear, MMM ?PULM: CTAB w/o wheezes/crackles, normal respiratory effort, on room air ?CV: RRR w/o M/G/R ?GI: abd soft, NTND, NABS, no R/G/M, abdominal drains x 2 noted ?MSK: no peripheral edema, muscle strength globally intact 5/5 bilateral upper/lower extremities ?NEURO: CN II-XII intact, no focal deficits, sensation to light touch intact ?PSYCH: Depressed mood, flat affect ?Integumentary: dry/intact, no rashes or wounds ? ? ? ?Data Reviewed: I have personally reviewed following labs and imaging studies ? ?CBC: ?Recent Labs  ?Lab 07/24/21 ?0433 07/25/21 ?0215 07/26/21 ?6301 07/27/21 ?0329 07/28/21 ?0315  ?WBC 20.2* 21.5* 17.2* 17.5* 20.7*  ?NEUTROABS 14.5* 14.8* 15.8* 14.9* 19.5*  ?HGB 11.1* 11.0* 10.6* 10.3* 10.3*  ?HCT 33.0* 33.5* 32.6* 31.8* 32.6*  ?MCV 91.9 93.1 92.6 93.5 95.6  ?PLT 267 286 232 214 182  ? ?Basic Metabolic Panel: ?Recent Labs  ?Lab 07/24/21 ?0433 07/25/21 ?0215 07/26/21 ?6010 07/27/21 ?0329 07/28/21 ?0315  ?NA 135 135 134* 133* 132*  ?K 3.5 3.3* 3.2* 3.4* 3.5  ?CL 110 110 108 106 108  ?CO2 18* 18* 22 22 19*  ?GLUCOSE 151* 136* 152* 144* 132*  ?BUN '9 9 8 9 10  '$ ?CREATININE 0.67 0.69 0.61 0.60 0.54  ?CALCIUM 7.7* 7.7* 7.4* 7.3* 7.4*  ?MG  --   --  1.9 1.9 1.7  ? ?GFR: ?Estimated Creatinine Clearance: 57.6 mL/min (by C-G formula based on SCr of 0.54  mg/dL). ?Liver Function Tests: ?Recent Labs  ?Lab 07/22/21 ?8338 07/24/21 ?2505 07/25/21 ?0215 07/26/21 ?3976 07/27/21 ?7341  ?AST '20 21 26 28 25  '$ ?ALT '14 13 14 12 13  '$ ?ALKPHOS 138* 183* 204* 188* 171*  ?BILITOT 0.7  0.9 0.9 0.8 0.3  ?PROT 4.8* 4.9* 5.0* 4.7* 4.7*  ?ALBUMIN 1.7* 1.7* 1.7* 1.6* 1.6*  ? ?No results for input(s): LIPASE, AMYLASE in the last 168 hours. ?No results for input(s): AMMONIA in the last 168 hou

## 2021-07-28 NOTE — Progress Notes (Signed)
ANTICOAGULATION CONSULT NOTE  ?Pharmacy Consult for Heparin ?Indication: pulmonary embolus and DVT ? ? ?Allergies  ?Allergen Reactions  ? Penicillins Hives and Other (See Comments)  ?  Tolerated Ceftriaxone 2023 ? ?Has patient had a PCN reaction causing immediate rash, facial/tongue/throat swelling, SOB or lightheadedness with hypotension: No ?Has patient had a PCN reaction causing severe rash involving mucus membranes or skin necrosis: No ?Has patient had a PCN reaction that required hospitalization No ?Has patient had a PCN reaction occurring within the last 10 years: No ?If all of the above answers are "NO", then may proceed with Cephalosporin use.  ? ? ?Patient Measurements: ?Height: '5\' 3"'$  (160 cm) ?Weight: 78.8 kg (173 lb 11.6 oz) ?IBW/kg (Calculated) : 52.4 ?Heparin Dosing Weight: 70 kg ? ?Vital Signs: ?Temp: 99 ?F (37.2 ?C) (05/13 0444) ?Temp Source: Oral (05/13 0444) ?BP: 119/68 (05/13 0444) ?Pulse Rate: 89 (05/13 0444) ? ?Labs: ?Recent Labs  ?  07/26/21 ?0322 07/26/21 ?1424 07/27/21 ?0329 07/27/21 ?1418 07/28/21 ?0315  ?HGB 10.6*  --  10.3*  --  10.3*  ?HCT 32.6*  --  31.8*  --  32.6*  ?PLT 232  --  214  --  182  ?HEPARINUNFRC 0.23*   < > 0.37 0.48 0.52  ?CREATININE 0.61  --  0.60  --  0.54  ? < > = values in this interval not displayed.  ? ? ?Estimated Creatinine Clearance: 57.6 mL/min (by C-G formula based on SCr of 0.54 mg/dL). ? ?Assessment: ?76 yr old female with new PE/DVT for heparin, now s/p CT guided catheter drainage of perihepatic abscess 07/25/21 AM. ? ?Heparin level therapeutic at 0.52 on 1600 units/hr. Infusing through PIV and labs being drawn by IV team. No bleeding noted, CBC is stable. ? ?Goal of Therapy:  ?Heparin level 0.3-0.7 units/ml ?Monitor platelets by anticoagulation protocol: Yes ?  ?Plan:  ?Continue heparin drip at 1600 units/hr  ?Daily heparin level, CBC while on heparin ?Monitor for s/sx of bleeding ? ?Adria Dill, PharmD ?PGY-1 Acute Care Resident  ?07/28/2021 7:54 AM   ? ? ? ? ? ?

## 2021-07-28 NOTE — Progress Notes (Signed)
? ? ?Referring Physician(s): ?Akula ? ?Supervising Physician: Jacqulynn Cadet ? ?Patient Status:  [facility] - In-pt ? ?Chief Complaint: ? ?F/U drains ? ?Brief History: ? ?[name] is a 76 y.o. female with medical issues including of DM, TIA, HTN, alcohol abuse, pancreatic cancer currently (actively receiving chemotherapy), and recent COVID PNA.  ? ?She presented to the ED at [facility] on 07/19/21 with AMS, fatigue, cough and anorexia.  ? ?She was found to be septic with gallbladder thickening.  ? ?CT abd pelvis showed diffuse gallbladder wall thickening and edema with an apparent ?area of discontinuity in the posterior aspect of the distal gallbladder wall. The possibility of gallbladder rupture or perforation is not excluded.  ? ?HIDA scan from 5.5.23 showed nonvisualized gallbladder despite morphine augmentation, raising concern for an acute cholecystitis. No persistent accumulation of activity at the gallbladder fossa to suggests focal leak or biloma. The few non persistent foci of activity are seen at the inferior right hepatic margin and may represent artifact or contamination.  ? ?She underwent perc chole on 07/20/21 by Dr. Annamaria Boots. ? ?She developed worsening leukocytosis on 07/24/21 so another CT scan was obtained which showed New subcapsular fluid collection adjacent to the liver, the largest component measuring up to 17.6 cm. ? ?She underwent placement of a peri-hepatic drain by Dr. Kathlene Cote on 07/25/21. ? ?Subjective: ? ?Doing better. Sitting up in bed. Very pleasant.  ? ?Allergies: ?Penicillins ? ?Medications: ?Prior to Admission medications   ?Medication Sig Start Date End Date Taking? Authorizing Provider  ?ACCU-CHEK AVIVA PLUS test strip As needed 05/09/21   [name], MD  ?Accu-Chek Softclix Lancets lancets As needed 04/23/21   [name], MD  ?albuterol (VENTOLIN HFA) 108 (90 Base) MCG/ACT inhaler Inhale 2 puffs into the lungs every 6 (six) hours as needed for wheezing or shortness of breath. ?Patient  not taking: Reported on 07/10/2021 07/02/21   Flora Lipps, MD  ?Alcohol Swabs PADS USE AS DIRECTED 05/18/21   [name], MD  ?ALPRAZolam Duanne Moron) 0.5 MG tablet Take 1 tablet (0.5 mg total) by mouth daily as needed for anxiety. 11/23/20   Owens Shark, NP  ?amLODipine (NORVASC) 5 MG tablet Take 5 mg by mouth daily. ?Patient not taking: Reported on 07/10/2021 03/30/21   [provider]  ?ascorbic acid (VITAMIN C) 500 MG tablet Take 1 tablet (500 mg total) by mouth daily. ?Patient not taking: Reported on 07/10/2021 07/03/21 08/02/21  Flora Lipps, MD  ?aspirin EC 81 MG tablet Take 81 mg by mouth every morning.    [provider]  ?Blood Glucose Monitoring Suppl (ACCU-CHEK GUIDE) w/Device KIT As needed 05/11/21   [name], MD  ?cyanocobalamin (,VITAMIN B-12,) 1000 MCG/ML injection Inject 1 mL (1,000 mcg total) into the muscle every 30 (thirty) days. 01/11/21   [name], MD  ?docusate sodium (COLACE) 100 MG capsule Take 100 mg by mouth 2 (two) times daily as needed (constipation).    [provider]  ?guaiFENesin-dextromethorphan (ROBITUSSIN DM) 100-10 MG/5ML syrup Take 10 mLs by mouth every 4 (four) hours as needed for cough. ?Patient not taking: Reported on 07/10/2021 07/02/21   Flora Lipps, MD  ?levothyroxine (SYNTHROID) 125 MCG tablet TAKE 1 TABLET ON AN EMPTY STOMACH IN THE MORNING ?Patient taking differently: Take 125 mcg by mouth daily before breakfast. 06/04/21   [name], MD  ?lidocaine-prilocaine (EMLA) cream APPLY 1 APPLICATION TOPICALLY AS NEEDED. ?Patient taking differently: Apply 1 application. topically daily as needed (numbing). 07/04/21  Ladell Pier, MD  ?omeprazole (PRILOSEC) 20 MG capsule TAKE 1 CAPSULE BY MOUTH EVERY DAY ?Patient taking differently: Take 20 mg by mouth daily. 04/18/21   [name], MD  ?ondansetron (ZOFRAN) 8 MG tablet Take 1 tablet (8 mg total) by mouth 2 (two) times daily as needed (Nausea or vomiting). ?Patient not taking:  Reported on 07/10/2021 09/12/20   Truitt Merle, MD  ?prochlorperazine (COMPAZINE) 10 MG tablet Take 1 tablet (10 mg total) by mouth every 6 (six) hours as needed (Nausea or vomiting). 09/12/20   Truitt Merle, MD  ?zinc sulfate 220 (50 Zn) MG capsule Take 1 capsule (220 mg total) by mouth daily. ?Patient not taking: Reported on 07/06/2021 07/03/21 08/02/21  Flora Lipps, MD  ? ? ? ?Vital Signs: ?BP 118/72 (BP Location: Right Arm)   Pulse 86   Temp 98.2 ?F (36.8 ?C) (Oral)   Resp 18   Ht '5\' 3"'  (1.6 m)   Wt 173 lb 11.6 oz (78.8 kg)   SpO2 98%   BMI 30.77 kg/m?  ? ?Physical Exam ?Vitals reviewed.  ?Constitutional:   ?   Appearance: Normal appearance.  ?Cardiovascular:  ?   Rate and Rhythm: Normal rate.  ?Pulmonary:  ?   Effort: Pulmonary effort is normal. No respiratory distress.  ?Abdominal:  ?   Palpations: Abdomen is soft.  ?Neurological:  ?   General: No focal deficit present.  ?   Mental Status: She is alert and oriented to person, place, and time.  ?Psychiatric:     ?   Mood and Affect: Mood normal.     ?   Behavior: Behavior normal.     ?   Thought Content: Thought content normal.     ?   Judgment: Judgment normal.  ?Drain Location: Peri-hepatic ?Size: Fr size: 12 Fr ?Date of placement: 07/25/21  ?Currently to: Drain collection device: suction bulb ?Drain Location: Pharmacist, community ?Size: Fr size: 10 Fr ?Date of placement: 07/20/2021  ?Currently to: Drain collection device: gravity ?24 hour output:  ?Output by Drain (mL) 07/26/21 0701 - 07/26/21 1900 07/26/21 1901 - 07/27/21 0700 07/27/21 0701 - 07/27/21 1900 07/27/21 1901 - 07/28/21 0700 07/28/21 0701 - 07/28/21 1343  ?Closed System Drain RUQ 10.2 Fr.  85 80 100 80  ?Closed System Drain 1 Right RUQ Bulb (JP) 12 Fr. 75 50 70 50 50  ? ?Current examination: ?Flushes/aspirates easily.  ?Insertion site unremarkable. ?Suture and stat lock in place. ?Dressed appropriately.  ? ?Imaging: ?DG CHEST PORT 1 VIEW ? ?Result Date: 07/27/2021 ?CLINICAL DATA:  Shortness of breath, chest  congestion EXAM: PORTABLE CHEST 1 VIEW COMPARISON:  Chest radiograph 07/24/2021 FINDINGS: The right chest wall port is stable in position. There is a new pigtail catheter in the right upper quadrant. The cardiomediastinal silhouette is stable. The right pleural effusion has decreased in size, with improved aeration of the right lower lobe following thoracentesis. Residual patchy opacities in the perihilar region may reflect atelectasis. There is no appreciable pneumothorax. The left lung is clear. There is no significant left effusion. There is no left pneumothorax. The bones are stable. IMPRESSION: Decreased size of the right pleural effusion with improved aeration of the right base following thoracentesis. No appreciable pneumothorax. Electronically Signed   By: Valetta Mole M.D.   On: 07/27/2021 09:46  ? ?CT IMAGE GUIDED DRAINAGE BY PERCUTANEOUS CATHETER ? ?Result Date: 07/25/2021 ?CLINICAL DATA:  History of pancreatic carcinoma and development large right-sided perihepatic/subcapsular fluid collection. EXAM: CT GUIDED CATHETER DRAINAGE  OF PERIHEPATIC ABSCESS ANESTHESIA/SEDATION: Moderate (conscious) sedation was employed during this procedure. A total of Versed 1.0 mg and Fentanyl 75 mcg was administered intravenously by radiology nursing. Moderate Sedation Time: 18 minutes. The patient's level of consciousness and vital signs were monitored continuously by radiology nursing throughout the procedure under my direct supervision. PROCEDURE: The procedure, risks, benefits, and alternatives were explained to the patient. Questions regarding the procedure were encouraged and answered. The patient understands and consents to the procedure. A time out was performed prior to initiating the procedure. CT was performed through the upper abdomen in a supine position. The abdominal wall was prepped with chlorhexidine in a sterile fashion, and a sterile drape was applied covering the operative field. A sterile gown and  sterile gloves were used for the procedure. Local anesthesia was provided with 1% Lidocaine. An 18 gauge trocar needle was advanced into a right lateral perihepatic/subcapsular fluid collection. After confirming need

## 2021-07-29 DIAGNOSIS — K810 Acute cholecystitis: Secondary | ICD-10-CM | POA: Diagnosis not present

## 2021-07-29 LAB — GLUCOSE, CAPILLARY
Glucose-Capillary: 116 mg/dL — ABNORMAL HIGH (ref 70–99)
Glucose-Capillary: 125 mg/dL — ABNORMAL HIGH (ref 70–99)
Glucose-Capillary: 126 mg/dL — ABNORMAL HIGH (ref 70–99)
Glucose-Capillary: 137 mg/dL — ABNORMAL HIGH (ref 70–99)

## 2021-07-29 LAB — CBC WITH DIFFERENTIAL/PLATELET
Abs Immature Granulocytes: 0.2 10*3/uL — ABNORMAL HIGH (ref 0.00–0.07)
Basophils Absolute: 0 10*3/uL (ref 0.0–0.1)
Basophils Relative: 0 %
Eosinophils Absolute: 0 10*3/uL (ref 0.0–0.5)
Eosinophils Relative: 0 %
HCT: 31 % — ABNORMAL LOW (ref 36.0–46.0)
Hemoglobin: 10.2 g/dL — ABNORMAL LOW (ref 12.0–15.0)
Lymphocytes Relative: 10 %
Lymphs Abs: 2 10*3/uL (ref 0.7–4.0)
MCH: 30.9 pg (ref 26.0–34.0)
MCHC: 32.9 g/dL (ref 30.0–36.0)
MCV: 93.9 fL (ref 80.0–100.0)
Monocytes Absolute: 1 10*3/uL (ref 0.1–1.0)
Monocytes Relative: 5 %
Myelocytes: 1 %
Neutro Abs: 16.9 10*3/uL — ABNORMAL HIGH (ref 1.7–7.7)
Neutrophils Relative %: 84 %
Platelets: 173 10*3/uL (ref 150–400)
RBC: 3.3 MIL/uL — ABNORMAL LOW (ref 3.87–5.11)
RDW: 17.8 % — ABNORMAL HIGH (ref 11.5–15.5)
WBC: 20.1 10*3/uL — ABNORMAL HIGH (ref 4.0–10.5)
nRBC: 0 % (ref 0.0–0.2)
nRBC: 0 /100 WBC

## 2021-07-29 LAB — BASIC METABOLIC PANEL
Anion gap: 7 (ref 5–15)
BUN: 8 mg/dL (ref 8–23)
CO2: 20 mmol/L — ABNORMAL LOW (ref 22–32)
Calcium: 7.3 mg/dL — ABNORMAL LOW (ref 8.9–10.3)
Chloride: 105 mmol/L (ref 98–111)
Creatinine, Ser: 0.6 mg/dL (ref 0.44–1.00)
GFR, Estimated: >60 mL/min (ref >60)
Glucose, Bld: 121 mg/dL — ABNORMAL HIGH (ref 70–99)
Potassium: 3.4 mmol/L — ABNORMAL LOW (ref 3.5–5.1)
Sodium: 132 mmol/L — ABNORMAL LOW (ref 135–145)

## 2021-07-29 LAB — MAGNESIUM: Magnesium: 2.1 mg/dL (ref 1.7–2.4)

## 2021-07-29 LAB — HEPARIN LEVEL (UNFRACTIONATED): Heparin Unfractionated: 0.55 IU/mL (ref 0.30–0.70)

## 2021-07-29 MED ORDER — POTASSIUM CHLORIDE CRYS ER 20 MEQ PO TBCR
40.0000 meq | EXTENDED_RELEASE_TABLET | Freq: Once | ORAL | Status: AC
  Administered 2021-07-29: 40 meq via ORAL
  Filled 2021-07-29 (×2): qty 2

## 2021-07-29 NOTE — Progress Notes (Signed)
ANTICOAGULATION CONSULT NOTE  ?Pharmacy Consult for Heparin ?Indication: pulmonary embolus and DVT ? ? ?Allergies  ?Allergen Reactions  ? Penicillins Hives and Other (See Comments)  ?  Tolerated Ceftriaxone 2023 ? ?Has patient had a PCN reaction causing immediate rash, facial/tongue/throat swelling, SOB or lightheadedness with hypotension: No ?Has patient had a PCN reaction causing severe rash involving mucus membranes or skin necrosis: No ?Has patient had a PCN reaction that required hospitalization No ?Has patient had a PCN reaction occurring within the last 10 years: No ?If all of the above answers are "NO", then may proceed with Cephalosporin use.  ? ? ?Patient Measurements: ?Height: '5\' 3"'$  (160 cm) ?Weight: 78.8 kg (173 lb 11.6 oz) ?IBW/kg (Calculated) : 52.4 ?Heparin Dosing Weight: 70 kg ? ?Vital Signs: ?Temp: 97.7 ?F (36.5 ?C) (05/14 7017) ?Temp Source: Oral (05/14 7939) ?BP: 123/72 (05/14 0612) ?Pulse Rate: 80 (05/14 0612) ? ?Labs: ?Recent Labs  ?  07/27/21 ?0329 07/27/21 ?1418 07/28/21 ?0300 07/29/21 ?9233  ?HGB 10.3*  --  10.3* 10.2*  ?HCT 31.8*  --  32.6* 31.0*  ?PLT 214  --  182 173  ?HEPARINUNFRC 0.37 0.48 0.52 0.55  ?CREATININE 0.60  --  0.54 0.60  ? ? ?Estimated Creatinine Clearance: 57.6 mL/min (by C-G formula based on SCr of 0.6 mg/dL). ? ?Assessment: ?76 yr old female with new PE/DVT for heparin, now s/p CT guided catheter drainage of perihepatic abscess 07/25/21 AM. ? ?Heparin level therapeutic at 0.55 on 1600 units/hr. Infusing through PIV and labs being drawn by IV team. No bleeding noted, CBC is stable. ? ?Goal of Therapy:  ?Heparin level 0.3-0.7 units/ml ?Monitor platelets by anticoagulation protocol: Yes ?  ?Plan:  ?Continue heparin drip at 1600 units/hr  ?Daily heparin level, CBC while on heparin ?Monitor for s/sx of bleeding ?Planning to transition to eliquis on discharge  ? ?Adria Dill, PharmD ?PGY-1 Acute Care Resident  ?07/29/2021 8:30 AM  ? ? ? ? ? ?

## 2021-07-29 NOTE — Progress Notes (Signed)
?PROGRESS NOTE ? ? ? ?[name]  ION:[id number] DOB: [birth year] DOA: 07/18/2021 ?PCP: [name], MD  ? ? ?Brief Narrative:  ? ?[name] is a 76 year old female with past medical history significant for pancreatic cancer on chemotherapy, type 2 diabetes mellitus, hypothyroidism, GERD who presented to [facility] ED on 5/3 via EMS with confusion and fever.  Patient also has been complaining of generalized abdominal pain with intermittent nausea.  ? ?In the ED, sodium 129, potassium 3.6, chloride 102, CO2 16, glucose 191, BUN 11, creatinine 0.77.  Lactic acid 1.6.  WBC 7.7, hemoglobin 10.9.  TSH 2.251.  CT abdomen/pelvis with diffuse gallbladder wall thickening and edema with apparent area of discontinuity posterior aspect of the distal gallbladder wall with concern for gallbladder rupture or perforation, small ascites, sigmoid diverticulosis, ill-defined known pancreatic mass encasing the portal vein and portal splenic confluence with collateralization, several small bilateral pulmonary nodules, hypodense lesion inferior right lobe.  General surgery was consulted.  Hospital service consulted for further evaluation and management of altered mental status and concern for acute cholecystitis. ? ?Assessment & Plan: ?  ?Acute metabolic encephalopathy ?Etiology likely secondary to severe sepsis with bacteremia/UTI and acute cholecystitis.  Now resolved and appears to be at baseline. ?--Supportive care and treatment as below ? ?Severe sepsis, POA ?Morganella morganii/E. coli bacteremia ?E. coli/Enterococcus faecium UTI ?Acute cholecystitis s/p cholecystostomy ?Patient presenting to ED with confusion and found to have concerns for acute cholecystitis on CT abdomen/pelvis.  Initially started on empiric antibiotics with vancomycin, metronidazole, cefepime/aztreonam.  Evaluated by general surgery with recommendations of IR placement of cholecystostomy.  Underwent IR cholecystostomy placement on 07/20/2021 with culture  positive for E. coli, Morganella morganii, Bacteroides ovatus. Repeat CT abdomen/pelvis notable for new subcapsular fluid collection near the liver spanning 17 cm. ?--Infectious disease following, appreciate assistance ?--WBC 7.7>>24.7>20.2>21.5>17.2>17.5>20.7>20.4 ?--Drain culture 5/10: No organisms on Gram stain, no growth x 4 days ?--RUQ drain w/ 105 mL output last 24h ?--JP drain with 70 mL output last 24h ?--Cefepime 2 g IV every 12 hours ?--Metronidazole 500 mg PO q12h ?--CBC daily ?--Continue monitor drain output ?--ID recommending 2-3 weeks cefepime/metronidazole ? ?Left lower lobe pulmonary embolism ?CTA chest 5/9 with bilateral segmental and subsegmental pulmonary emboli.  Vascular duplex ultrasound bilateral lower extremities 5/9 with findings consistent with acute DVT right common femoral vein, acute DVT left common femoral vein and left proximal profunda vein; common femoral vein obstruction does not appear to extend above inguinal ligament. ?--Continue heparin drip, anticipate transition to apixaban on discharge ? ?Right pleural effusion ?Underwent IR thoracentesis on 07/24/2021 with removal of 350 mL clear yellow fluid.  Positive lights criteria consistent with exudative pleural effusion.  Pleural fluid cytology negative for malignant cells.  Repeat chest x-ray 5/12 shows improvement in the right-sided pleural effusion with decreased atelectasis. ?--Pleural fluid culture: No growth x 3 days ? ?Type 2 diabetes mellitus ?Hemoglobin A1c 6.3 on 07/10/2021, well controlled. ?--SSI for coverage ?--CBGs qAC/HS ? ?Pancreatic cancer ?Follows with medical oncology outpatient, Dr. Benay Spice. ? ?Hypothyroidism ?--Levothyroxine 100 mcg p.o. daily ? ?Generalized anxiety disorder ?--Alprazolam 0.25 PO daily PRN ? ?Essential hypertension ?--Amlodipine 5 mg p.o. daily ? ?Hypomagnesemia ?Repleted.  Magnesium 1.7 this morning, will continue to replete. ?--Magnesium level in a.m. ? ?Hypokalemia ?Potassium 3.2 this morning,  will replete. ?--Repeat BMP in a.m. to include magnesium ? ?Hx COVID-19 viral infection 06/20/2021 ?Oxygenating well on room air. ? ?Ethics: ?Given patient's multiple comorbidities, debility and clinical decline, palliative care was consulted for  assistance with goals of care and medical decision making.  Seen by palliative care on 5/9 and recommendations are to continue with full code/full scope of treatment.  Outpatient palliative care to follow-up at cancer center arranged following discharge. ? ?Physical deconditioning: ?--Continue PT/OT efforts while inpatient ?--Pending SNF placement ? ? ?DVT prophylaxis: SCDs Start: 07/18/21 1925 ? ?  Code Status: Full Code ?Family Communication: Updated patient's spouse present at bedside this morning ? ?Disposition Plan:  ?Level of care: Med-Surg ?Status is: Inpatient ?Remains inpatient appropriate because: awaiting SNF placement per TOC ?  ? ?Consultants:  ?General surgery, signed off 5/6 ?Infectious disease ?Palliative care ?Interventional radiology ? ?Procedures:  ?IR cholecystostomy placement ?IR drain placement 5/10 ? ?Antimicrobials:  ?Vancomycin 5/3 - 5/3 ?Aztreonam 5/3 - 5/3 ?Ceftriaxone 5/4 - 5/6 ?Cefepime 5/3, 5/6>> ?Metronidazole 5/3>> ? ? ?Subjective: ?Patient seen examined bedside, resting currently.  No family present this morning.  No specific complaints.  Continues with good output from JP and right upper quadrant drain.  Awaiting SNF placement.  Denies headache, no chest pain, no palpitations, no shortness of breath, no nausea/vomiting/diarrhea, no fever/chills/night sweats, no paresthesias, no fatigue.  No acute concerns overnight per nursing staff. ? ?Objective: ?Vitals:  ? 07/28/21 1643 07/28/21 2145 07/29/21 0612 07/29/21 0839  ?BP: 125/70 119/72 123/72 122/63  ?Pulse: 90 89 80 87  ?Resp: '18 18 18 19  '$ ?Temp: 97.6 ?F (36.4 ?C) 97.7 ?F (36.5 ?C) 97.7 ?F (36.5 ?C) 97.7 ?F (36.5 ?C)  ?TempSrc: Axillary Oral Oral Oral  ?SpO2: 98% 97% 97% 99%  ?Weight:       ?Height:      ? ? ?Intake/Output Summary (Last 24 hours) at 07/29/2021 1331 ?Last data filed at 07/29/2021 1040 ?Gross per 24 hour  ?Intake 360 ml  ?Output 105 ml  ?Net 255 ml  ? ?Filed Weights  ? 07/21/21 0500 07/24/21 0601 07/28/21 0500  ?Weight: 67 kg 78.4 kg 78.8 kg  ? ? ?Examination: ? ?Physical Exam: ?GEN: NAD, alert and oriented x 3, chronically ill in appearance ?HEENT: NCAT, PERRL, EOMI, sclera clear, MMM ?PULM: CTAB w/o wheezes/crackles, normal respiratory effort, on room air ?CV: RRR w/o M/G/R ?GI: abd soft, NTND, NABS, no R/G/M, abdominal drains x 2 noted ?MSK: no peripheral edema, muscle strength globally intact 5/5 bilateral upper/lower extremities ?NEURO: CN II-XII intact, no focal deficits, sensation to light touch intact ?PSYCH: Depressed mood, flat affect ?Integumentary: dry/intact, no rashes or wounds ? ? ? ?Data Reviewed: I have personally reviewed following labs and imaging studies ? ?CBC: ?Recent Labs  ?Lab 07/25/21 ?0215 07/26/21 ?0322 07/27/21 ?0329 07/28/21 ?5643 07/29/21 ?3295  ?WBC 21.5* 17.2* 17.5* 20.7* 20.1*  ?NEUTROABS 14.8* 15.8* 14.9* 19.5* 16.9*  ?HGB 11.0* 10.6* 10.3* 10.3* 10.2*  ?HCT 33.5* 32.6* 31.8* 32.6* 31.0*  ?MCV 93.1 92.6 93.5 95.6 93.9  ?PLT 286 232 214 182 173  ? ?Basic Metabolic Panel: ?Recent Labs  ?Lab 07/25/21 ?0215 07/26/21 ?0322 07/27/21 ?0329 07/28/21 ?1884 07/29/21 ?1660  ?NA 135 134* 133* 132* 132*  ?K 3.3* 3.2* 3.4* 3.5 3.4*  ?CL 110 108 106 108 105  ?CO2 18* 22 22 19* 20*  ?GLUCOSE 136* 152* 144* 132* 121*  ?BUN '9 8 9 10 8  '$ ?CREATININE 0.69 0.61 0.60 0.54 0.60  ?CALCIUM 7.7* 7.4* 7.3* 7.4* 7.3*  ?MG  --  1.9 1.9 1.7 2.1  ? ?GFR: ?Estimated Creatinine Clearance: 57.6 mL/min (by C-G formula based on SCr of 0.6 mg/dL). ?Liver Function Tests: ?Recent Labs  ?Lab 07/24/21 ?[phone]  07/25/21 ?0215 07/26/21 ?0722 07/27/21 ?5750  ?AST '21 26 28 25  '$ ?ALT '13 14 12 13  '$ ?ALKPHOS 183* 204* 188* 171*  ?BILITOT 0.9 0.9 0.8 0.3  ?PROT 4.9* 5.0* 4.7* 4.7*  ?ALBUMIN 1.7* 1.7* 1.6*  1.6*  ? ?No results for input(s): LIPASE, AMYLASE in the last 168 hours. ?No results for input(s): AMMONIA in the last 168 hours. ?Coagulation Profile: ?Recent Labs  ?Lab 07/24/21 ?1306  ?INR 1.2  ? ?Cardiac

## 2021-07-30 ENCOUNTER — Other Ambulatory Visit (HOSPITAL_COMMUNITY): Payer: Self-pay

## 2021-07-30 DIAGNOSIS — K810 Acute cholecystitis: Secondary | ICD-10-CM | POA: Diagnosis not present

## 2021-07-30 LAB — AEROBIC/ANAEROBIC CULTURE W GRAM STAIN (SURGICAL/DEEP WOUND)
Culture: NO GROWTH
Special Requests: NORMAL

## 2021-07-30 LAB — CBC WITH DIFFERENTIAL/PLATELET
Abs Immature Granulocytes: 0 10*3/uL (ref 0.00–0.07)
Basophils Absolute: 0 10*3/uL (ref 0.0–0.1)
Basophils Relative: 0 %
Eosinophils Absolute: 0.2 10*3/uL (ref 0.0–0.5)
Eosinophils Relative: 1 %
HCT: 31.5 % — ABNORMAL LOW (ref 36.0–46.0)
Hemoglobin: 10 g/dL — ABNORMAL LOW (ref 12.0–15.0)
Lymphocytes Relative: 4 %
Lymphs Abs: 0.6 10*3/uL — ABNORMAL LOW (ref 0.7–4.0)
MCH: 30.6 pg (ref 26.0–34.0)
MCHC: 31.7 g/dL (ref 30.0–36.0)
MCV: 96.3 fL (ref 80.0–100.0)
Monocytes Absolute: 0.6 10*3/uL (ref 0.1–1.0)
Monocytes Relative: 4 %
Neutro Abs: 14.3 10*3/uL — ABNORMAL HIGH (ref 1.7–7.7)
Neutrophils Relative %: 91 %
Platelets: 171 10*3/uL (ref 150–400)
RBC: 3.27 MIL/uL — ABNORMAL LOW (ref 3.87–5.11)
RDW: 18.4 % — ABNORMAL HIGH (ref 11.5–15.5)
WBC: 15.7 10*3/uL — ABNORMAL HIGH (ref 4.0–10.5)
nRBC: 0 % (ref 0.0–0.2)
nRBC: 0 /100 WBC

## 2021-07-30 LAB — HEPARIN LEVEL (UNFRACTIONATED): Heparin Unfractionated: 0.44 IU/mL (ref 0.30–0.70)

## 2021-07-30 LAB — BASIC METABOLIC PANEL
Anion gap: 8 (ref 5–15)
BUN: 8 mg/dL (ref 8–23)
CO2: 19 mmol/L — ABNORMAL LOW (ref 22–32)
Calcium: 7.5 mg/dL — ABNORMAL LOW (ref 8.9–10.3)
Chloride: 107 mmol/L (ref 98–111)
Creatinine, Ser: 0.58 mg/dL (ref 0.44–1.00)
GFR, Estimated: >60 mL/min (ref >60)
Glucose, Bld: 105 mg/dL — ABNORMAL HIGH (ref 70–99)
Potassium: 3.7 mmol/L (ref 3.5–5.1)
Sodium: 134 mmol/L — ABNORMAL LOW (ref 135–145)

## 2021-07-30 LAB — MAGNESIUM: Magnesium: 2.1 mg/dL (ref 1.7–2.4)

## 2021-07-30 LAB — GLUCOSE, CAPILLARY
Glucose-Capillary: 114 mg/dL — ABNORMAL HIGH (ref 70–99)
Glucose-Capillary: 116 mg/dL — ABNORMAL HIGH (ref 70–99)
Glucose-Capillary: 122 mg/dL — ABNORMAL HIGH (ref 70–99)
Glucose-Capillary: 136 mg/dL — ABNORMAL HIGH (ref 70–99)
Glucose-Capillary: 157 mg/dL — ABNORMAL HIGH (ref 70–99)

## 2021-07-30 MED ORDER — FAMOTIDINE 20 MG PO TABS
20.0000 mg | ORAL_TABLET | Freq: Every day | ORAL | Status: DC
  Administered 2021-07-30 – 2021-08-01 (×3): 20 mg via ORAL
  Filled 2021-07-30 (×3): qty 1

## 2021-07-30 NOTE — Progress Notes (Addendum)
? ?[facility] Infectious Diseases Follow Up Note ? ?Patient Identification: ?Patient Name: [name] MRN: [id number] [place] Date: 07/18/2021  2:47 PM ?Age: 76 y.o.Today's Date: 07/30/2021 ? ?Reason for Visit: Bacteremia/abscess ? ?Principal Problem: ?  Acute cholecystitis ?Active Problems: ?  Acquired hypothyroidism ?  GAD (generalized anxiety disorder) ?  Diet-controlled diabetes mellitus ([place]) ?  Acute metabolic encephalopathy ?  Hyponatremia ?  Cholecystitis ?  Sepsis ([place]) ?  Goals of care, counseling/discussion ? ?Antibiotics:  ?Vancomycin 5/3 ?Cefepime 5/3, 5/6-c ?Aztreonam 5/3, Ceftriaxone 5/4-5/6 ?Metronidazole 5/3-c ?  ?Lines/Hardware: Rt chest port, GI stent, RT UQ drain* 2  ? ?Interval Events: Remains afebrile, leukocytosis is downtrending. 210 mls out put from drain in the last 24 hrs ( 7 am to 7 am) ? ? ?Assessment ?# Subcapsular fluid collection adjacent to the liver.  17.6 cm ?# Moderate-sized right pleural effusion with atelectasis in the right lower lobe ?- s/p rt sided thoracentesis 5/9. Cx no growth  ?- S/p CT guided drainage of perihepatic fluid 5/10. Cx no growth ? ? 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  ?# Acute cholecystitis S/p cholecystostomy by IR 5/5. Cx with Morganella morganii/B ovatus and E coli  ?  ?# Leukocytosis - downtrending  ?  ?# Pancreatic ca on chemotherapy ( last chemo gemcitabine/abraxane 4/24) - Oncology following  ? ?# DVT of lower extremities and PE - ON AC ?  ?# DM ( a1c 6.3) ?  ?Recommendations ?Continue cefepime and metronidazole ?Plan for 2 weeks from 5/10 with a plan for fu imaging prior to end date ?Drain management and repeat imaging per IR ?Monitor CBC and BMP ?Will follow in the clinic. Otherwise, ID will sign off. Call with questions  ? ?Rest of the management as per the primary team. ?Thank you for the consult. Please page with pertinent questions or  concerns. ? ?______________________________________________________________________ ?Subjective ?patient seen and examined at the bedside.  Husband at bedside ?Abdominal pain is better ?Denies nausea vomiting, some loose stools (suggestive of C diff) ? ?Vitals ?BP 116/70 (BP Location: Right Arm)   Pulse 83   Temp 98.6 ?F (37 ?C) (Oral)   Resp 18   Ht '5\' 3"'$  (1.6 m)   Wt 77.2 kg   SpO2 99%   BMI 30.15 kg/m?  ? ?  ?Physical Exam ?Constitutional: Lying in the bed ?   Comments:  ? ?Cardiovascular:  ?   Rate and Rhythm: Normal rate and regular rhythm.  ?   Heart sounds:  ? ?Pulmonary:  ?   Effort: Pulmonary effort is normal on room air  ?   Comments:  ? ?Abdominal:  ?   Palpations: Abdomen is soft.  ?   Tenderness: mild abdominal tenderness, no RT and 2 drainage catheters in place ? ?Musculoskeletal:     ?   General: No swelling or tenderness.  ? ?Skin: ?   Comments:  ? ?Neurological:  ?   General: Grossly nonfocal, awake alert and oriented ? ?Psychiatric:     ?   Mood and Affect: Mood normal.  ? ?Pertinent Microbiology ?Results for orders placed or performed during the hospital encounter of 07/18/21  ?Resp Panel by RT-PCR (Flu A&B, Covid) Nasopharyngeal Swab     Status: Abnormal  ? Collection Time: 07/18/21  2:47 PM  ? Specimen: Nasopharyngeal Swab; Nasopharyngeal(NP) swabs in vial transport medium  ?Result Value Ref Range Status  ? SARS Coronavirus 2 by RT PCR POSITIVE (A) NEGATIVE Final  ?  Comment: (NOTE) ?SARS-CoV-2 target  nucleic acids are DETECTED. ? ?The SARS-CoV-2 RNA is generally detectable in upper respiratory ?specimens during the acute phase of infection. Positive results are ?indicative of the presence of the identified virus, but do not rule ?out bacterial infection or co-infection with other pathogens not ?detected by the test. Clinical correlation with patient history and ?other diagnostic information is necessary to determine patient ?infection status. The expected result is Negative. ? ?Fact  Sheet for Patients: ?EntrepreneurPulse.com.au ? ?Fact Sheet for Healthcare Providers: ?IncredibleEmployment.be ? ?This test is not yet approved or cleared by the [place] FDA and  ?has been authorized for detection and/or diagnosis of SARS-CoV-2 by ?FDA under an Emergency Use Authorization (EUA).  This EUA will ?remain in effect (meaning this test can be used) for the duration of  ?the COVID-19 declaration under Section 564(b)(1) of the A ct, 21 ?U.S.C. section 360bbb-3(b)(1), unless the authorization is ?terminated or revoked sooner. ? ?  ? Influenza A by PCR NEGATIVE NEGATIVE Final  ? Influenza B by PCR NEGATIVE NEGATIVE Final  ?  Comment: (NOTE) ?The Xpert Xpress SARS-CoV-2/FLU/RSV plus assay is intended as an aid ?in the diagnosis of influenza from Nasopharyngeal swab specimens and ?should not be used as a sole basis for treatment. Nasal washings and ?aspirates are unacceptable for Xpert Xpress SARS-CoV-2/FLU/RSV ?testing. ? ?Fact Sheet for Patients: ?EntrepreneurPulse.com.au ? ?Fact Sheet for Healthcare Providers: ?IncredibleEmployment.be ? ?This test is not yet approved or cleared by the [place] FDA and ?has been authorized for detection and/or diagnosis of SARS-CoV-2 by ?FDA under an Emergency Use Authorization (EUA). This EUA will remain ?in effect (meaning this test can be used) for the duration of the ?COVID-19 declaration under Section 564(b)(1) of the Act, 21 U.S.C. ?section 360bbb-3(b)(1), unless the authorization is terminated or ?revoked. ? ?Performed at [facility], [place] [address], [place], [place] ?[address] ?  ?Culture, blood (Routine x 2)     Status: Abnormal  ? Collection Time: 07/18/21  3:03 PM  ? Specimen: BLOOD LEFT HAND  ?Result Value Ref Range Status  ? Specimen Description BLOOD LEFT HAND  Final  ? Special Requests   Final  ?  BOTTLES DRAWN AEROBIC AND ANAEROBIC Blood Culture results may not be optimal  due to an inadequate volume of blood received in culture bottles  ? Culture  Setup Time   Final  ?  GRAM NEGATIVE RODS ?ANAEROBIC BOTTLE ONLY ?CRITICAL VALUE NOTED.  VALUE IS CONSISTENT WITH PREVIOUSLY REPORTED AND CALLED VALUE. ?IN BOTH AEROBIC AND ANAEROBIC BOTTLES ?  ? Culture (A)  Final  ?  MORGANELLA MORGANII ?CRITICAL RESULT CALLED TO, READ BACK BY AND VERIFIED WITH: PHARMD G.BARR AT 1221 ON 07/21/2021 BY T.SAAD. ?Performed at [facility], [name] [address] [place], [place] [address] ?  ? Report Status 07/23/2021 FINAL  Final  ? Organism ID, Bacteria MORGANELLA MORGANII  Final  ?    Susceptibility  ? Morganella morganii - MIC*  ?  AMPICILLIN >=32 RESISTANT Resistant   ?  CEFAZOLIN >=64 RESISTANT Resistant   ?  CEFTAZIDIME <=1 SENSITIVE Sensitive   ?  CIPROFLOXACIN <=0.25 SENSITIVE Sensitive   ?  GENTAMICIN <=1 SENSITIVE Sensitive   ?  IMIPENEM 8 INTERMEDIATE Intermediate   ?  TRIMETH/SULFA <=20 SENSITIVE Sensitive   ?  AMPICILLIN/SULBACTAM >=32 RESISTANT Resistant   ?  PIP/TAZO <=4 SENSITIVE Sensitive   ?  * MORGANELLA MORGANII  ?Culture, blood (Routine x 2)     Status: Abnormal  ? Collection Time: 07/18/21  3:07 PM  ? Specimen: BLOOD RIGHT FOREARM  ?Result Value Ref Range Status  ? Specimen Description BLOOD RIGHT FOREARM  Final  ? Special Requests   Final  ?  BOTTLES DRAWN AEROBIC AND ANAEROBIC Blood Culture adequate volume  ? Culture  Setup Time   Final  ?  GRAM NEGATIVE RODS ?ANAEROBIC BOTTLE ONLY ?IN BOTH AEROBIC AND ANAEROBIC BOTTLES ?CRITICAL RESULT CALLED TO, READ BACK BY AND VERIFIED WITH: ?PHARMD GREG ABBOTT 07/19/21'@6'$ :10 BY TW ?  ? Culture (A)  Final  ?  ESCHERICHIA COLI ?MORGANELLA MORGANII ?SUSCEPTIBILITIES PERFORMED ON PREVIOUS CULTURE WITHIN THE LAST 5 DAYS. ?Performed at [facility], [place] [address], [place], [place] [address] ?  ? Report Status 07/23/2021 FINAL  Final  ? Organism ID, Bacteria ESCHERICHIA COLI  Final  ?    Susceptibility  ? Escherichia coli - MIC*  ?  AMPICILLIN 8  SENSITIVE Sensitive   ?  CEFAZOLIN <=4 SENSITIVE Sensitive   ?  CEFEPIME <=0.12 SENSITIVE Sensitive   ?  CEFTAZIDIME <=1 SENSITIVE Sensitive   ?  CEFTRIAXONE <=0.25 SENSITIVE Sensitive   ?  CIPROFLOXACIN <=0.25 SENSITIVE Sensitive   ?  GENTAMICIN <=1 SENSITIVE S

## 2021-07-30 NOTE — Progress Notes (Addendum)
HEMATOLOGY-ONCOLOGY PROGRESS NOTE ? ?ASSESSMENT AND PLAN: ?Adenocarcinoma the pancreas body, stage IV (cT4,cN0,pM1) ?Ultrasound abdomen 08/18/2020-possible hypoechoic pancreas body mass, small hypoechoic liver areas ?MRI abdomen 08/27/2020-pancreas body mass, multiple hepatic lesions consistent with metastases, right abdominal omental nodularity, pancreas mass extends to the celiac bifurcation and abuts the splenic vein and splenoportal confluence ?CT chest 09/07/2020-scattered pulmonary nodules concerning for metastases, pancreas body mass, hepatic metastases ?Ultrasound-guided biopsy of a right liver lesion 09/08/2020-adenocarcinoma consistent with a pancreas primary ?CT abdomen/pelvis 09/24/2020-pancreas neck mass, omental and peritoneal nodularity-unchanged, multiple hypoenhancing liver masses-unchanged, numerous small pulmonary nodules ?Cycle 1 gemcitabine/Abraxane 10/04/2020 ?Cycle 2 gemcitabine/Abraxane 10/27/2020 ?Cycle 3 gemcitabine/Abraxane 11/11/2018 ?Cycle 4 gemcitabine/Abraxane 11/23/2020 ?Cycle 5 gemcitabine/Abraxane 12/08/2020 ?CT abdomen/pelvis 12/18/2020-stable pancreas mass, stable and improved liver lesions, resolution of omental soft tissue density, stable indeterminate lung nodules, no disease progression ?Cycle 6 gemcitabine/Abraxane 12/22/2020 ?Cycle 7 gemcitabine 01/05/2021, 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  ?Cycle 8 gemcitabine/Abraxane 01/19/2021 ?Cycle 9 gemcitabine/Abraxane 02/01/2021 ?Cycle 10 Gemcitabine 02/16/2021, Abraxane held due to increased neuropathy ?Cycle 11 Gemcitabine 03/05/2021, 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 ?Cycle 12 gemcitabine 03/14/2021, 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 ?Cycle 13 gemcitabine 04/02/2021, 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 ?Cycle 14 gemcitabine/Abraxane 04/16/2021, Abraxane resumed at a reduced dose ?Cycle 15 gemcitabine/Abraxane 04/30/2021 ?CT abdomen/pelvis 05/08/2021-stable pancreas mass, liver metastases are slightly smaller, stable tiny bilateral lower lung nodules ?Cycle  16 gemcitabine/Abraxane 05/14/2021, Abraxane escalated back to 100 mg per metered squared ?Cycle 17 gemcitabine/Abraxane 05/28/2021 ?Cycle 18 gemcitabine/Abraxane 06/11/2021 ?Cycle 19 gemcitabine/Abraxane 07/09/2021 ?CT Abdo/pelvis 07/18/2021-diffuse gallbladder wall thickening and edema, small volume ascites, stable pulmonary nodules, indeterminate 15 x 18 mm right liver lesion, ill-defined hypodensity in the body of the pancreas ?  ?2.  Pain secondary #1 ?3.  Diabetes ?4.  Hypertension ?5.  Osteoarthritis ?6.  Port-A-Cath placement 09/22/2020 ?7.  Admission with jaundice 09/24/2020.  MRI-new abrupt constriction of the common hepatic duct.  The infiltrative pancreatic mass extends medially in the porta hepatis to obstruct the common hepatic duct.  Multiple right hepatic lobe metastases mildly increased in size.  Stent placed into the common bile duct 09/28/2020. ?8.  COVID-19 06/20/2021, admission with COVID and bacterial pneumonia?  06/30/2021-completed course of antibiotics ?9.  Hospital admission 07/18/2021-bacteremia, UTI, acute cholecystitis ?CT abdomen/pelvis 07/24/2021-moderate right pleural effusion, new subcapsular fluid collections adjacent to the right liver, interval cholecystostomy tube, stable pancreas body mass, incidental pulmonary embolism and a left lower lobe pulmonary artery ?10.  Pulmonary embolism/bilateral DVTs ?CT abdomen/pelvis 07/24/2021-incidental left lower lobe pulmonary embolism ?CT chest 07/24/2021-segmental and subsegmental bilateral pulmonary emboli ?Dopplers 07/24/2021-DVTs in the right common femoral, left common femoral, left profunda veins ?Heparin 07/24/2021 ? ?[name] appears stable.  She remains on heparin for new diagnosis of DVTs and pulmonary emboli.  Plan is to continue anticoagulation with heparin and transition to apixaban prior to discharge. ? ?CT scans performed this admission show no clear-cut evidence of pancreatic cancer progression.  Cytology from the thoracentesis performed 07/24/2021  showed reactive mesothelial cells and no malignant cells identified.  Plan is to resume gemcitabine/Abraxane when she has recovered.  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  We will continue to monitor this. ? ?ID is following for her bacteremia.  They plan to continue cefepime and metronidazole for about 2 weeks from 5/10.  They plan for follow-up imaging prior to end date. ? ?Recommendations: ?1.  Continue heparin anticoagulation, convert to apixaban at discharge ?2.  Management of the cholecystitis/perihepatic/pleural fluid collections per the  medical service and interventional radiology ?3.  Continue antibiotics per recommendation of ID. ?4.  Outpatient follow-up will be scheduled at the Cancer center, please call oncology as needed ? ?[name], DNP, AGPCNP-BC, AOCNP ? ?[name] was interviewed and examined.  She appears stable.  She indicated she plans to continue treatment of the pancreas cancer.  She is scheduled for outpatient follow-up with the Cancer center next week.  The plan is to begin apixaban anticoagulation at discharge. ? ?I was present for greater than 50% today's visit.  I performed medical decision making. ? ? ?SUBJECTIVE: Working with PT at the time my visit.  Remains on heparin and no bleeding reported.   Reports neuropathy and swelling to her feet. Will be going to SNF upon discharge.  Will need continued IV antibiotics for 2 weeks from 5/10 per ID.   ? ? ?Oncology History Overview Note  ?Cancer Staging ?Primary pancreatic cancer with metastasis to other site [facility]) ?Staging form: Exocrine Pancreas, AJCC 8th Edition ?- Clinical stage from 09/08/2020: Stage IV (cT4, cN0, pM1) - Signed by Truitt Merle, MD on 09/12/2020 ?Stage prefix: Initial diagnosis ?Total positive nodes: 0 ? ?  ?Primary pancreatic cancer with metastasis to other site [facility])  ?08/27/2020 Imaging  ? Abdominal MRI w wo contrast: ?IMPRESSION: ?1. Pancreatic body infiltrative  mass, favoring adenocarcinoma. ?2. Multiple hepatic lesions, most consistent with metastasis. ?3. Right abdominal omental nodularity, highly suspicious for ?peritoneal metastasis. ?4. Recommend multidisciplinary oncology consultation for eventual ?sampling of either the liver or pancreatic lesions. ?5. Vascular involvement with tumor, as detailed above. ?  ?09/07/2020 Imaging  ? CT chest  ?IMPRESSION: ?1. Scattered small pulmonary nodules, worrisome for metastatic ?disease. ?2. Pancreatic body mass and hepatic metastases, better seen and ?described on MR abdomen 08/27/2020. ?3. Aortic atherosclerosis (ICD10-I70.0). Coronary artery ?calcification. ?  ?09/08/2020 Cancer Staging  ? Staging form: Exocrine Pancreas, AJCC 8th Edition ?- Clinical stage from 09/08/2020: Stage IV (cT4, cN0, pM1) - Signed by Truitt Merle, MD on 09/12/2020 ?Stage prefix: Initial diagnosis ?Total positive nodes: 0 ?  ?09/08/2020 Pathology Results  ? A. LIVER, RIGHT, BIOPSY:  ?- Adenocarcinoma.  See comment.  ? ?COMMENT:  ? ?The morphology is consistent with a pancreatic primary.  ?  ?09/12/2020 Initial Diagnosis  ? Primary pancreatic cancer with metastasis to other site [facility]) ?  ?10/12/2020 -  Chemotherapy  ? Patient is on Treatment Plan : PANCREATIC Abraxane / Gemcitabine D1,8,15 q28d  ?   ? ?PHYSICAL EXAMINATION: ? ?Vitals:  ? 07/30/21 0424 07/30/21 0826  ?BP: 130/68 116/70  ?Pulse: 81 83  ?Resp: 18 18  ?Temp: 98.8 ?F (37.1 ?C) 98.6 ?F (37 ?C)  ?SpO2: 98% 99%  ? ?Filed Weights  ? 07/24/21 0601 07/28/21 0500 07/30/21 0659  ?Weight: 78.4 kg 78.8 kg 77.2 kg  ? ? ?Intake/Output from previous day: ?05/14 0701 - 05/15 0700 ?In: 180 [P.O.:120; I.V.:10] ?Out: 210 [Drains:210] ? ?Physical Exam ?Vitals reviewed.  ?Constitutional:   ?   General: She is not in acute distress. ?HENT:  ?   Head: Normocephalic.  ?Eyes:  ?   General: No scleral icterus. ?   Conjunctiva/sclera: Conjunctivae normal.  ?Cardiovascular:  ?   Rate and Rhythm: Normal rate.  ?Pulmonary:  ?    Effort: Pulmonary effort is normal. No respiratory distress.  ?Abdominal:  ?   Palpations: Abdomen is soft.  ?   Comments: 2 drains noted  ?Skin: ?   General: Skin is warm and dry.  ?Neurological:  ?  Men

## 2021-07-30 NOTE — Discharge Instructions (Addendum)
Information on my medicine - ELIQUIS? (apixaban) ? ?This medication education was reviewed with me or my healthcare representative as part of my discharge preparation.   ?Why was Eliquis? prescribed for you? ?Eliquis? was prescribed to treat blood clots that may have been found in the veins of your legs (deep vein thrombosis) or in your lungs (pulmonary embolism) and to reduce the risk of them occurring again. ? ?What do You need to know about Eliquis? ? ?The starting dose is 10 mg (two 5 mg tablets) taken TWICE daily, then on  08/02/21   the dose is reduced to ONE 5 mg tablet taken TWICE daily.  Eliquis? may be taken with or without food.  ? ?Try to take the dose about the same time in the morning and in the evening. If you have difficulty swallowing the tablet whole please discuss with your pharmacist how to take the medication safely. ? ?Take Eliquis? exactly as prescribed and DO NOT stop taking Eliquis? without talking to the doctor who prescribed the medication.  Stopping may increase your risk of developing a new blood clot.  Refill your prescription before you run out. ? ?After discharge, you should have regular check-up appointments with your healthcare provider that is prescribing your Eliquis?. ?   ?What do you do if you miss a dose? ?If a dose of ELIQUIS? is not taken at the scheduled time, take it as soon as possible on the same day and twice-daily administration should be resumed. The dose should not be doubled to make up for a missed dose. ? ?Important Safety Information ?A possible side effect of Eliquis? is bleeding. You should call your healthcare provider right away if you experience any of the following: ?Bleeding from an injury or your nose that does not stop. ?Unusual colored urine (red or dark brown) or unusual colored stools (red or black). ?Unusual bruising for unknown reasons. ?A serious fall or if you hit your head (even if there is no bleeding). ? ?Some medicines may interact with Eliquis?  and might increase your risk of bleeding or clotting while on Eliquis?Marland Kitchen To help avoid this, consult your healthcare provider or pharmacist prior to using any new prescription or non-prescription medications, including herbals, vitamins, non-steroidal anti-inflammatory drugs (NSAIDs) and supplements. ? ?This website has more information on Eliquis? (apixaban): http://www.eliquis.com/eliquis/home ? ? ? ?======================================================================== ?Deep Vein Thrombosis ? ? ?  ?Deep vein thrombosis (DVT) is a condition in which a blood clot forms in a deep vein, such as a lower leg, thigh, or arm vein. A clot is blood that has thickened into a gel or solid. This condition is dangerous. It can lead to serious and even life-threatening complications if the clot travels to the lungs and causes a blockage (pulmonary embolism). It can also damage veins in the leg. This can result in leg pain, swelling, discoloration, and sores (post-thrombotic syndrome). ? ?What are the causes? ?This condition may be caused by: ?A slowdown of blood flow. ?Damage to a vein. ?A condition that causes blood to clot more easily, such as an inherited clotting disorder. ? ?What increases the risk? ?The following factors may make you more likely to develop this condition: ?Being overweight. ?Being older, especially over age 46. ?Sitting or lying down for more than four hours. ?Being in the hospital. ?Lack of physical activity (sedentary lifestyle). ?Pregnancy, being in childbirth, or having recently given birth. ?Taking medicines that contain estrogen, such as medicines to prevent pregnancy. ?Smoking. ?A history of any of the following: ?  Blood clots or a blood clotting disease. ?Peripheral vascular disease. ?Inflammatory bowel disease. ?Cancer. ?Heart disease. ?Genetic conditions that affect how your blood clots, such as Factor V Leiden mutation. ?Neurological diseases that affect your legs (leg paresis). ?A recent  injury, such as a car accident. ?Major or lengthy surgery. ?A central line placed inside a large vein. ? ?What are the signs or symptoms? ?Symptoms of this condition include: ?Swelling, pain, or tenderness in an arm or leg. ?Warmth, redness, or discoloration in an arm or leg. ?If the clot is in your leg, symptoms may be more noticeable or worse when you stand or walk. Some people may not develop any symptoms. ? ?How is this diagnosed? ?This condition is diagnosed with: ?A medical history and physical exam. ?Tests, such as: ?Blood tests. These are done to check how well your blood clots. ?Ultrasound. This is done to check for clots. ?Venogram. For this test, contrast dye is injected into a vein and X-rays are taken to check for any clots ? ?How is this treated? ?Treatment for this condition depends on: ?The cause of your DVT. ?Your risk for bleeding or developing more clots. ?Any other medical conditions that you have. ?Treatment may include: ?Taking a blood thinner (anticoagulant). This type of medicine prevents clots from forming. It may be taken by mouth, injected under the skin, or injected through an IV (catheter). ?Injecting clot-dissolving medicines into the affected vein (catheter-directed thrombolysis). ?Having surgery. Surgery may be done to: ?Remove the clot. ?Place a filter in a large vein to catch blood clots before they reach the lungs. ?Some treatments may be continued for up to six months. ? ?Follow these instructions at home: ?If you are taking blood thinners: ?Take the medicine exactly as told by your health care provider. Some blood thinners need to be taken at the same time every day. Do not skip a dose. ?Talk with your health care provider before you take any medicines that contain aspirin or NSAIDs. These medicines increase your risk for dangerous bleeding. ?Ask your health care provider about foods and drugs that could change the way the medicine works (may interact). Avoid those things if your  health care provider tells you to do so. ?Blood thinners can cause easy bruising and may make it difficult to stop bleeding. Because of this: ?Be very careful when using knives, scissors, or other sharp objects. ?Use an electric razor instead of a blade. ?Avoid activities that could cause injury or bruising, and follow instructions about how to prevent falls. ?Wear a medical alert bracelet or carry a card that lists what medicines you take. ? ?General instructions ?Take over-the-counter and prescription medicines only as told by your health care provider. ?Return to your normal activities as told by your health care provider. Ask your health care provider what activities are safe for you. ?Wear compression stockings if recommended by your health care provider. ?Keep all follow-up visits as told by your health care provider. This is important. ? ?How is this prevented? ?To lower your risk of developing this condition again: ?For 30 or more minutes every day, do an activity that: ?Involves moving your arms and legs. ?Increases your heart rate. ?When traveling for longer than four hours: ?Exercise your arms and legs every hour. ?Drink plenty of water. ?Avoid drinking alcohol. ?Avoid sitting or lying for a long time without moving your legs. ?If you have surgery or you are hospitalized, ask about ways to prevent blood clots. These may include taking frequent walks or  using anticoagulants. ?Stay at a healthy weight. ?If you are a woman who is older than age 52, avoid unnecessary use of medicines that contain estrogen, such as some birth control pills. ?Do not use any products that contain nicotine or tobacco, such as cigarettes and e-cigarettes. This is especially important if you take estrogen medicines. If you need help quitting, ask your health care provider. ? ?Contact a health care provider if: ?You miss a dose of your blood thinner. ?Your menstrual period is heavier than usual. ?You have unusual bruising. ? ?Get  help right away if: ?You have: ?New or increased pain, swelling, or redness in an arm or leg. ?Numbness or tingling in an arm or leg. ?Shortness of breath. ?Chest pain. ?A rapid or irregular heartbeat. ?A s

## 2021-07-30 NOTE — Progress Notes (Signed)
Physical Therapy Treatment ?Patient Details ?Name: [name] ?MRN: [id number] ?DOB: February 22, 1946 ?Today's Date: 07/30/2021 ? ? ?History of Present Illness 76 y.o. female s/p cholecystostomy by IR 5/5 with morganella morganii ecoli bacteremia.  Has dx also UTI sepsis. PMH 06/30/21 COVID-19 infection diagnosed 06/20/21 with COVID PNA.  T2DM, HLD, HTN, hx of TIA, depression/anxiety, metastatic pancreatic cancer currently undergoing chemotherapy (last infusion 06/11/21) ? ?  ?PT Comments  ? ? Pt received supine and agreeable to session with encouragement with focus on continued progress and tolerance for transfer and gait training. Pt requiring mod assist to come to sitting EOB to elevate trun secondary to decreased UE strength and abdominal pain. Pt able to come to standing with RW x3 throughout session, with min assist from EOB and mod assist from low recliner to power up. Pt continues to fatigue quickly and requires substantial seated recovery between tasks. Pt able to demonstrate ambulation from EOB to recliner with min assist with RW with good foot clearance, encouraged pt to continue for further distance but pt declining secondary to pain and fatigue. Pt educated on importance of continued mobility and activity recommendations with pt verbalizing understanding.   ?Recommendations for follow up therapy are one component of a multi-disciplinary discharge planning process, led by the attending physician.  Recommendations may be updated based on patient status, additional functional criteria and insurance authorization. ? ?Follow Up Recommendations ? Skilled nursing-short term rehab (<3 hours/day) ?  ?  ?Assistance Recommended at Discharge Frequent or constant Supervision/Assistance  ?Patient can return home with the following A little help with walking and/or transfers;A little help with bathing/dressing/bathroom;Assistance with cooking/housework;Direct supervision/assist for medications management;Direct  supervision/assist for financial management;Assist for transportation;Help with stairs or ramp for entrance ?  ?Equipment Recommendations ? None recommended by PT  ?  ?Recommendations for Other Services   ? ? ?  ?Precautions / Restrictions Precautions ?Precautions: Fall ?Precaution Comments: R flank Drain ?Restrictions ?Weight Bearing Restrictions: No  ?  ? ?Mobility ? Bed Mobility ?Overal bed mobility: Needs Assistance ?Bed Mobility: Rolling, Sidelying to Sit ?Rolling: Min assist ?Sidelying to sit: Mod assist ?  ?  ?  ?General bed mobility comments: mod a to elevate trunk ?  ? ?Transfers ?Overall transfer level: Needs assistance ?Equipment used: Rolling walker (2 wheels) ?Transfers: Sit to/from Stand ?Sit to Stand: Mod assist, Min assist ?  ?  ?  ?  ?  ?General transfer comment: min a from EOB mod a from low recliner at end of session as pt fatigues ?  ? ?Ambulation/Gait ?Ambulation/Gait assistance: Min guard ?Gait Distance (Feet): 5 Feet ?Assistive device: Rolling walker (2 wheels) ?Gait Pattern/deviations: Step-through pattern, Decreased stride length, Shuffle, Trunk flexed, Knee flexed in stance - right, Knee flexed in stance - left ?Gait velocity: reduced ?  ?  ?General Gait Details: slow guarded gait, pt with c/o that LE feel heavy, shuffling feet ? ? ?Stairs ?  ?  ?  ?  ?  ? ? ?Wheelchair Mobility ?  ? ?Modified Rankin (Stroke Patients Only) ?  ? ? ?  ?Balance Overall balance assessment: Mild deficits observed, not formally tested ?  ?  ?  ?  ?  ?  ?  ?  ?  ?  ?  ?  ?  ?  ?  ?  ?  ?  ?  ? ?  ?Cognition Arousal/Alertness: Awake/alert ?Behavior During Therapy: Flat affect ?Overall Cognitive Status: Impaired/Different from baseline ?  ?  ?  ?  ?  ?  ?  ?  ?  ?  ?  ?  ?  ?  ?  ?  ?  ?  ?  ? ?  ?  Exercises General Exercises - Lower Extremity ?Ankle Circles/Pumps: AROM, Right, Left, 20 reps ?Quad Sets: AROM, Right, Left, 5 reps ?Gluteal Sets: 5 reps ? ?  ?General Comments   ?  ?  ? ?Pertinent Vitals/Pain Pain  Assessment ?Pain Assessment: Faces ?Faces Pain Scale: Hurts little more ?Pain Location: abdomen ?Pain Descriptors / Indicators: Discomfort, Guarding, Sore ?Pain Intervention(s): Limited activity within patient's tolerance, Monitored during session, Repositioned  ? ? ?Home Living   ?  ?  ?  ?  ?  ?  ?  ?  ?  ?   ?  ?Prior Function    ?  ?  ?   ? ?PT Goals (current goals can now be found in the care plan section) Acute Rehab PT Goals ?Patient Stated Goal: to get stronger ?PT Goal Formulation: With patient/family ?Time For Goal Achievement: 08/06/21 ? ?  ?Frequency ? ? ? Min 2X/week ? ? ? ?  ?PT Plan Current plan remains appropriate  ? ? ?Co-evaluation   ?  ?  ?  ?  ? ?  ?AM-PAC PT "6 Clicks" Mobility   ?Outcome Measure ? Help needed turning from your back to your side while in a flat bed without using bedrails?: None ?Help needed moving from lying on your back to sitting on the side of a flat bed without using bedrails?: A Little ?Help needed moving to and from a bed to a chair (including a wheelchair)?: A Little ?Help needed standing up from a chair using your arms (e.g., wheelchair or bedside chair)?: A Little ?Help needed to walk in hospital room?: A Lot ?Help needed climbing 3-5 steps with a railing? : Total ?6 Click Score: 16 ? ?  ?End of Session Equipment Utilized During Treatment: Gait belt ?Activity Tolerance: Patient tolerated treatment well ?Patient left: in chair;with call bell/phone within reach;with chair alarm set ?Nurse Communication: Mobility status ?PT Visit Diagnosis: Unsteadiness on feet (R26.81);Muscle weakness (generalized) (M62.81);Difficulty in walking, not elsewhere classified (R26.2) ?  ? ? ?Time: 1250-1315 ?PT Time Calculation (min) (ACUTE ONLY): 25 min ? ?Charges:  $Gait Training: 8-22 mins ?$Therapeutic Activity: 8-22 mins          ?          ? ?[name]. PTA ?Acute Rehabilitation Services ?Office: [phone] ? ? ? ?[name] Kavita Bartl ?07/30/2021, 1:24 PM ? ?

## 2021-07-30 NOTE — TOC Progression Note (Signed)
Transition of Care (TOC) - Progression Note  ? ? ?Patient Details  ?Name: [name] ?MRN: [id number] ?Date of Birth: Jun 26, 1945 ? ?Transition of Care (TOC) CM/SW Contact  ?Emeterio Reeve, LCSW ?Phone Number: ?07/30/2021, 4:15 PM ? ?Clinical Narrative:    ? ?CSW spoke to pt and daughter [name] at bedside. Pt still has same two bed offers as last week. Pt and daughter are not happy with offers. Daughter requested CSW resend to [facility] center. CSW did and [facility] declined a second time.  ? ?CSW and NCM spoke to pt and daughter at bedside. Daughter chose [facility] since [facility] has no beds. CSW contacted [place] and they will start insurance auth. They are able to accept pt when auth is back.  ? ?Expected Discharge Plan: [place] ?Barriers to Discharge: Continued Medical Work up, Ship broker ? ?Expected Discharge Plan and Services ?Expected Discharge Plan: [place] ?  ?  ?  ?Living arrangements for the past 2 months: [place] ?                ?  ?  ?  ?  ?  ?  ?  ?  ?  ?  ? ? ?Social Determinants of Health (SDOH) Interventions ?  ? ?Readmission Risk Interventions ?   ? View : No data to display.  ?  ?  ?  ? ?Emeterio Reeve, LCSW ?Clinical Social Worker ? ?

## 2021-07-30 NOTE — Progress Notes (Signed)
ANTICOAGULATION CONSULT NOTE  ?Pharmacy Consult for Heparin ?Indication: pulmonary embolus and DVT ? ? ?Allergies  ?Allergen Reactions  ? Penicillins Hives and Other (See Comments)  ?  Tolerated Ceftriaxone 2023 ? ?Has patient had a PCN reaction causing immediate rash, facial/tongue/throat swelling, SOB or lightheadedness with hypotension: No ?Has patient had a PCN reaction causing severe rash involving mucus membranes or skin necrosis: No ?Has patient had a PCN reaction that required hospitalization No ?Has patient had a PCN reaction occurring within the last 10 years: No ?If all of the above answers are "NO", then may proceed with Cephalosporin use.  ? ? ?Patient Measurements: ?Height: '5\' 3"'$  (160 cm) ?Weight: 77.2 kg (170 lb 3.1 oz) ?IBW/kg (Calculated) : 52.4 ?Heparin Dosing Weight: 70 kg ? ?Vital Signs: ?Temp: 98.6 ?F (37 ?C) (05/15 6811) ?Temp Source: Oral (05/15 5726) ?BP: 116/70 (05/15 0826) ?Pulse Rate: 83 (05/15 0826) ? ?Labs: ?Recent Labs  ?  07/28/21 ?2035 07/29/21 ?5974 07/30/21 ?0324  ?HGB 10.3* 10.2* 10.0*  ?HCT 32.6* 31.0* 31.5*  ?PLT 182 173 171  ?HEPARINUNFRC 0.52 0.55 0.44  ?CREATININE 0.54 0.60 0.58  ? ?Estimated Creatinine Clearance: 57 mL/min (by C-G formula based on SCr of 0.58 mg/dL). ? 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 No anticoagulation prior to admission. Pharmacy consulted for heparin.   ? ?Heparin level therapeutic at 0.44 on 1600 units/hr. Infusing through PIV and labs being drawn by IV team. No bleeding noted, CBC is stable. ? ?Goal of Therapy:  ?Heparin level 0.3-0.7 units/ml ?Monitor platelets by anticoagulation protocol: Yes ?  ?Plan:  ?Continue heparin drip at 1600 units/hr  ?Daily heparin level, CBC while on heparin ?Monitor for s/sx of bleeding ?Planning to transition to eliquis on discharge  ? ?Benetta Spar, PharmD, BCPS, BCCP ?Clinical Pharmacist ? ?Please check AMION for all [place] phone numbers ?After 10:00 PM,  call [facility] [phone] ? ?

## 2021-07-30 NOTE — Progress Notes (Signed)
?PROGRESS NOTE ? ? ? ?[name]  RAQ:[id number] DOB: [birth year] DOA: 07/18/2021 ?PCP: [name], MD  ? ? ?Brief Narrative:  ? ?[name] is a 76 year old female with past medical history significant for pancreatic cancer on chemotherapy, type 2 diabetes mellitus, hypothyroidism, GERD who presented to [facility] ED on 5/3 via EMS with confusion and fever.  Patient also has been complaining of generalized abdominal pain with intermittent nausea.  ? ?In the ED, sodium 129, potassium 3.6, chloride 102, CO2 16, glucose 191, BUN 11, creatinine 0.77.  Lactic acid 1.6.  WBC 7.7, hemoglobin 10.9.  TSH 2.251.  CT abdomen/pelvis with diffuse gallbladder wall thickening and edema with apparent area of discontinuity posterior aspect of the distal gallbladder wall with concern for gallbladder rupture or perforation, small ascites, sigmoid diverticulosis, ill-defined known pancreatic mass encasing the portal vein and portal splenic confluence with collateralization, several small bilateral pulmonary nodules, hypodense lesion inferior right lobe.  General surgery was consulted.  Hospital service consulted for further evaluation and management of altered mental status and concern for acute cholecystitis. ? ?Assessment & Plan: ?  ?Acute metabolic encephalopathy ?Etiology likely secondary to severe sepsis with bacteremia/UTI and acute cholecystitis.  Now resolved and appears to be at baseline. ?--Supportive care and treatment as below ? ?Severe sepsis, POA ?Morganella morganii/E. coli bacteremia ?E. coli/Enterococcus faecium UTI ?Acute cholecystitis s/p cholecystostomy ?Patient presenting to ED with confusion and found to have concerns for acute cholecystitis on CT abdomen/pelvis.  Initially started on empiric antibiotics with vancomycin, metronidazole, cefepime/aztreonam.  Evaluated by general surgery with recommendations of IR placement of cholecystostomy.  Underwent IR cholecystostomy placement on 07/20/2021 with culture  positive for E. coli, Morganella morganii, Bacteroides ovatus. Repeat CT abdomen/pelvis notable for new subcapsular fluid collection near the liver spanning 17 cm. ?--Infectious disease following, appreciate assistance ?--WBC 7.7>>24.7>20.2>21.5>17.2>17.5>20.7>20.4>15.7 ?--Drain culture 5/10: No organisms on Gram stain, no growth x 5 days ?--RUQ drain w/ 90 mL output last 24h ?--JP drain with 120 mL output last 24h ?--Cefepime 2 g IV every 12 hours ?--Metronidazole 500 mg PO q12h ?--CBC daily ?--Continue monitor drain output ?--ID recommending 2-3 weeks cefepime/metronidazole ? ?Left lower lobe pulmonary embolism ?CTA chest 5/9 with bilateral segmental and subsegmental pulmonary emboli.  Vascular duplex ultrasound bilateral lower extremities 5/9 with findings consistent with acute DVT right common femoral vein, acute DVT left common femoral vein and left proximal profunda vein; common femoral vein obstruction does not appear to extend above inguinal ligament. ?--Continue heparin drip, anticipate transition to apixaban on discharge ? ?Right pleural effusion ?Underwent IR thoracentesis on 07/24/2021 with removal of 350 mL clear yellow fluid.  Positive lights criteria consistent with exudative pleural effusion.  Pleural fluid cytology negative for malignant cells.  Repeat chest x-ray 5/12 shows improvement in the right-sided pleural effusion with decreased atelectasis. ?--Pleural fluid culture: No growth x 3 days ? ?Type 2 diabetes mellitus ?Hemoglobin A1c 6.3 on 07/10/2021, well controlled. ?--SSI for coverage ?--CBGs qAC/HS ? ?Pancreatic cancer ?Follows with medical oncology outpatient, Dr. Benay Spice. ? ?Hypothyroidism ?--Levothyroxine 100 mcg p.o. daily ? ?Generalized anxiety disorder ?--Alprazolam 0.25 PO daily PRN ? ?Essential hypertension ?--Amlodipine 5 mg p.o. daily ? ?Hypomagnesemia ?Repleted.  Magnesium 2.1 this morning ? ?Hypokalemia ?Repleted.  Potassium 3.7 this morning, ? ? ?Hx COVID-19 viral infection  06/20/2021 ?Oxygenating well on room air. ? ?Ethics: ?Given patient's multiple comorbidities, debility and clinical decline, palliative care was consulted for assistance with goals of care and medical decision making.  Seen by palliative care  on 5/9 and recommendations are to continue with full code/full scope of treatment.  Outpatient palliative care to follow-up at cancer center arranged following discharge. ? ?Physical deconditioning: ?--Continue PT/OT efforts while inpatient ?--Pending SNF placement ? ? ?DVT prophylaxis: SCDs Start: 07/18/21 1925 ? ?  Code Status: Full Code ?Family Communication: Updated patient's spouse in the hallway this morning ? ?Disposition Plan:  ?Level of care: Med-Surg ?Status is: Inpatient ?Remains inpatient appropriate because: awaiting SNF placement per TOC ?  ? ?Consultants:  ?General surgery, signed off 5/6 ?Infectious disease ?Palliative care ?Interventional radiology ? ?Procedures:  ?IR cholecystostomy placement ?IR drain placement 5/10 ? ?Antimicrobials:  ?Vancomycin 5/3 - 5/3 ?Aztreonam 5/3 - 5/3 ?Ceftriaxone 5/4 - 5/6 ?Cefepime 5/3, 5/6>> ?Metronidazole 5/3>> ? ? ?Subjective: ?Patient seen examined bedside, resting currently.  Spouse present.  Seen by oncology, Dr. Benay Spice earlier this morning.  No specific complaints.  Continues with good output from JP and right upper quadrant drain.  Awaiting SNF placement.  Spouse and multiple family members continue to ask when the drains can be removed, discussed with them on multiple occasions that drains will remain in place for some time especially the cholecystostomy drain until output is minimal.  Denies headache, no chest pain, no palpitations, no shortness of breath, no nausea/vomiting/diarrhea, no fever/chills/night sweats, no paresthesias, no fatigue.  No acute concerns overnight per nursing staff. ? ?Objective: ?Vitals:  ? 07/29/21 2020 07/30/21 0424 07/30/21 5631 07/30/21 0826  ?BP: 114/64 130/68  116/70  ?Pulse: 93 81  83   ?Resp: '18 18  18  '$ ?Temp: 97.8 ?F (36.6 ?C) 98.8 ?F (37.1 ?C)  98.6 ?F (37 ?C)  ?TempSrc: Oral Oral  Oral  ?SpO2: 98% 98%  99%  ?Weight:   77.2 kg   ?Height:      ? ? ?Intake/Output Summary (Last 24 hours) at 07/30/2021 1001 ?Last data filed at 07/30/2021 0500 ?Gross per 24 hour  ?Intake 180 ml  ?Output 150 ml  ?Net 30 ml  ? ?Filed Weights  ? 07/24/21 0601 07/28/21 0500 07/30/21 0659  ?Weight: 78.4 kg 78.8 kg 77.2 kg  ? ? ?Examination: ? ?Physical Exam: ?GEN: NAD, alert and oriented x 3, chronically ill in appearance ?HEENT: NCAT, PERRL, EOMI, sclera clear, MMM ?PULM: CTAB w/o wheezes/crackles, normal respiratory effort, on room air ?CV: RRR w/o M/G/R ?GI: abd soft, NTND, NABS, no R/G/M, abdominal drains x 2 noted ?MSK: no peripheral edema, muscle strength globally intact 5/5 bilateral upper/lower extremities ?NEURO: CN II-XII intact, no focal deficits, sensation to light touch intact ?PSYCH: Depressed mood, flat affect ?Integumentary: dry/intact, no rashes or wounds ? ? ? ?Data Reviewed: I have personally reviewed following labs and imaging studies ? ?CBC: ?Recent Labs  ?Lab 07/26/21 ?0322 07/27/21 ?0329 07/28/21 ?4970 07/29/21 ?2637 07/30/21 ?0324  ?WBC 17.2* 17.5* 20.7* 20.1* 15.7*  ?NEUTROABS 15.8* 14.9* 19.5* 16.9* 14.3*  ?HGB 10.6* 10.3* 10.3* 10.2* 10.0*  ?HCT 32.6* 31.8* 32.6* 31.0* 31.5*  ?MCV 92.6 93.5 95.6 93.9 96.3  ?PLT 232 214 182 173 171  ? ?Basic Metabolic Panel: ?Recent Labs  ?Lab 07/26/21 ?0322 07/27/21 ?0329 07/28/21 ?8588 07/29/21 ?5027 07/30/21 ?0324  ?NA 134* 133* 132* 132* 134*  ?K 3.2* 3.4* 3.5 3.4* 3.7  ?CL 108 106 108 105 107  ?CO2 22 22 19* 20* 19*  ?GLUCOSE 152* 144* 132* 121* 105*  ?BUN '8 9 10 8 8  '$ ?CREATININE 0.61 0.60 0.54 0.60 0.58  ?CALCIUM 7.4* 7.3* 7.4* 7.3* 7.5*  ?MG 1.9 1.9 1.7 2.1 2.1  ? ?  GFR: ?Estimated Creatinine Clearance: 57 mL/min (by C-G formula based on SCr of 0.58 mg/dL). ?Liver Function Tests: ?Recent Labs  ?Lab 07/24/21 ?0433 07/25/21 ?0215 07/26/21 ?7893 07/27/21 ?0329   ?AST '21 26 28 25  '$ ?ALT '13 14 12 13  '$ ?ALKPHOS 183* 204* 188* 171*  ?BILITOT 0.9 0.9 0.8 0.3  ?PROT 4.9* 5.0* 4.7* 4.7*  ?ALBUMIN 1.7* 1.7* 1.6* 1.6*  ? ?No results for input(s): LIPASE, AMYLASE in the last 168

## 2021-07-30 NOTE — TOC Benefit Eligibility Note (Signed)
Patient Advocate Encounter ?  ?Insurance verification completed.   ?  ?The patient is currently admitted and upon discharge could be taking ELIQUIS 5 MG. ?  ?The current 30 day co-pay is, $45.  ? ?The patient is insured through [employer]. ? ? ?   ?

## 2021-07-30 NOTE — Progress Notes (Signed)
PHARMACY CONSULT NOTE FOR: ? ?OUTPATIENT  PARENTERAL ANTIBIOTIC THERAPY (OPAT) ? ?Indication: bacteremia and gallbladder abscess  ?Regimen: Cefepime IV 2g q12h via port; Flagyl 500 mg PO q12h ?End date: 08/08/2021 ? ?IV antibiotic discharge orders are pended. ?To discharging provider:  please sign these orders via discharge navigator,  ?Select New Orders & click on the button choice - Manage This Unsigned Work.  ?  ? ?Thank you for allowing pharmacy to be a part of this patient's care. ? ?Pauletta Browns ?07/30/2021, 10:13 AM ? ?

## 2021-07-31 ENCOUNTER — Ambulatory Visit: Payer: Medicare HMO | Admitting: Family Medicine

## 2021-07-31 DIAGNOSIS — K810 Acute cholecystitis: Secondary | ICD-10-CM | POA: Diagnosis not present

## 2021-07-31 LAB — CBC WITH DIFFERENTIAL/PLATELET
Abs Immature Granulocytes: 0.18 10*3/uL — ABNORMAL HIGH (ref 0.00–0.07)
Basophils Absolute: 0 10*3/uL (ref 0.0–0.1)
Basophils Relative: 0 %
Eosinophils Absolute: 0.1 10*3/uL (ref 0.0–0.5)
Eosinophils Relative: 1 %
HCT: 30.4 % — ABNORMAL LOW (ref 36.0–46.0)
Hemoglobin: 9.8 g/dL — ABNORMAL LOW (ref 12.0–15.0)
Immature Granulocytes: 1 %
Lymphocytes Relative: 15 %
Lymphs Abs: 1.9 10*3/uL (ref 0.7–4.0)
MCH: 31.2 pg (ref 26.0–34.0)
MCHC: 32.2 g/dL (ref 30.0–36.0)
MCV: 96.8 fL (ref 80.0–100.0)
Monocytes Absolute: 0.9 10*3/uL (ref 0.1–1.0)
Monocytes Relative: 7 %
Neutro Abs: 9.4 10*3/uL — ABNORMAL HIGH (ref 1.7–7.7)
Neutrophils Relative %: 76 %
Platelets: 152 10*3/uL (ref 150–400)
RBC: 3.14 MIL/uL — ABNORMAL LOW (ref 3.87–5.11)
RDW: 18.7 % — ABNORMAL HIGH (ref 11.5–15.5)
Smear Review: NORMAL
WBC: 12.5 10*3/uL — ABNORMAL HIGH (ref 4.0–10.5)
nRBC: 0 % (ref 0.0–0.2)

## 2021-07-31 LAB — BASIC METABOLIC PANEL
Anion gap: 5 (ref 5–15)
BUN: 8 mg/dL (ref 8–23)
CO2: 19 mmol/L — ABNORMAL LOW (ref 22–32)
Calcium: 7.4 mg/dL — ABNORMAL LOW (ref 8.9–10.3)
Chloride: 108 mmol/L (ref 98–111)
Creatinine, Ser: 0.6 mg/dL (ref 0.44–1.00)
GFR, Estimated: >60 mL/min (ref >60)
Glucose, Bld: 123 mg/dL — ABNORMAL HIGH (ref 70–99)
Potassium: 3.5 mmol/L (ref 3.5–5.1)
Sodium: 132 mmol/L — ABNORMAL LOW (ref 135–145)

## 2021-07-31 LAB — GLUCOSE, CAPILLARY
Glucose-Capillary: 121 mg/dL — ABNORMAL HIGH (ref 70–99)
Glucose-Capillary: 127 mg/dL — ABNORMAL HIGH (ref 70–99)
Glucose-Capillary: 160 mg/dL — ABNORMAL HIGH (ref 70–99)
Glucose-Capillary: 160 mg/dL — ABNORMAL HIGH (ref 70–99)

## 2021-07-31 LAB — MAGNESIUM: Magnesium: 1.9 mg/dL (ref 1.7–2.4)

## 2021-07-31 LAB — HEPARIN LEVEL (UNFRACTIONATED): Heparin Unfractionated: 0.68 IU/mL (ref 0.30–0.70)

## 2021-07-31 MED ORDER — APIXABAN 5 MG PO TABS
10.0000 mg | ORAL_TABLET | Freq: Two times a day (BID) | ORAL | Status: DC
  Administered 2021-07-31 – 2021-08-01 (×3): 10 mg via ORAL
  Filled 2021-07-31 (×3): qty 2

## 2021-07-31 MED ORDER — APIXABAN 5 MG PO TABS
5.0000 mg | ORAL_TABLET | Freq: Two times a day (BID) | ORAL | Status: DC
Start: 2021-08-02 — End: 2021-08-01

## 2021-07-31 NOTE — TOC Progression Note (Signed)
Transition of Care (TOC) - Progression Note  ? ? ?Patient Details  ?Name: [name] ?MRN: [id number] ?Date of Birth: February 12, 1946 ? ?Transition of Care (TOC) CM/SW Contact  ?Emeterio Reeve, LCSW ?Phone Number: ?07/31/2021, 3:03 PM ? ?Clinical Narrative:    ? ?Pts insurance Josem Kaufmann is still pending.  ? ?Expected Discharge Plan: [place] ?Barriers to Discharge: Continued Medical Work up, Ship broker ? ?Expected Discharge Plan and Services ?Expected Discharge Plan: [place] ?  ?  ?  ?Living arrangements for the past 2 months: [place] ?                ?  ?  ?  ?  ?  ?  ?  ?  ?  ?  ? ? ?Social Determinants of Health (SDOH) Interventions ?  ? ?Readmission Risk Interventions ?   ? View : No data to display.  ?  ?  ?  ? ?Emeterio Reeve, LCSW ?Clinical Social Worker ? ?

## 2021-07-31 NOTE — Progress Notes (Addendum)
ANTICOAGULATION CONSULT NOTE  ?Pharmacy Consult for Heparin >> apixaban  ?Indication: pulmonary embolus and DVT ? ? ?Allergies  ?Allergen Reactions  ? Penicillins Hives and Other (See Comments)  ?  Tolerated Ceftriaxone 2023 ? ?Has patient had a PCN reaction causing immediate rash, facial/tongue/throat swelling, SOB or lightheadedness with hypotension: No ?Has patient had a PCN reaction causing severe rash involving mucus membranes or skin necrosis: No ?Has patient had a PCN reaction that required hospitalization No ?Has patient had a PCN reaction occurring within the last 10 years: No ?If all of the above answers are "NO", then may proceed with Cephalosporin use.  ? ? ?Patient Measurements: ?Height: '5\' 3"'$  (160 cm) ?Weight: 77.2 kg (170 lb 3.1 oz) ?IBW/kg (Calculated) : 52.4 ?Heparin Dosing Weight: 70 kg ? ?Vital Signs: ?Temp: 98.5 ?F (36.9 ?C) (05/16 0421) ?Temp Source: Oral (05/16 0421) ?BP: 112/71 (05/16 0421) ?Pulse Rate: 81 (05/16 0421) ? ?Labs: ?Recent Labs  ?  07/29/21 ?1031 07/30/21 ?5945 07/31/21 ?8592  ?HGB 10.2* 10.0* 9.8*  ?HCT 31.0* 31.5* 30.4*  ?PLT 173 171 152  ?HEPARINUNFRC 0.55 0.44 0.68  ?CREATININE 0.60 0.58 0.60  ? ?Estimated Creatinine Clearance: 57 mL/min (by C-G formula based on SCr of 0.6 mg/dL). ? 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 No anticoagulation prior to admission. Pharmacy consulted for heparin.   ? ?Heparin level therapeutic and up to 0.68 on 1600 units/hr. Has been stable at this rate for 3 days.Heparin is infusing through PIV and labs being drawn by IV team via port. No bleeding noted, CBC is stable. ? ?Goal of Therapy:  ?Heparin level 0.3-0.7 units/ml ?Monitor platelets by anticoagulation protocol: Yes ?  ?Plan:  ?Continue heparin drip at 1600 units/hr  ?Daily heparin level, CBC while on heparin ?Monitor for s/sx of bleeding ?Planning to transition to eliquis on discharge  ? ?ADDENDUM 10:30- Consulted to switch to  apixaban. Will stop heparin now and start apixaban '10mg'$  BID x2 days then decrease to '5mg'$  BID.  ? ?Benetta Spar, PharmD, BCPS, BCCP ?Clinical Pharmacist ? ?Please check AMION for all [place] phone numbers ?After 10:00 PM, call [place] [phone] ? ?

## 2021-07-31 NOTE — Progress Notes (Signed)
Occupational Therapy Treatment ?Patient Details ?Name: [name] ?MRN: [id number] ?DOB: 1945/04/18 ?Today's Date: 07/31/2021 ? ? ?History of present illness 76 y.o. female s/p cholecystostomy by IR 5/5 with morganella morganii ecoli bacteremia.  Has dx also UTI sepsis. 5/9 R and L DVT PE+ 5/9 thorcentesis with JP drain PMH 06/30/21 COVID-19 infection diagnosed 06/20/21 with COVID PNA.  T2DM, HLD, HTN, hx of TIA, depression/anxiety, metastatic pancreatic cancer currently undergoing chemotherapy (last infusion 06/11/21) ?  ?OT comments ? Pt progressed from bed to chair this session with transfer >5 ft to see out the window. Pt finding joy in being in the warmth of the sun in the window. Pt express discomfort with prolonged sitting and recommendation for geo mat. Recommendation for HHOT at this time pending family (A). Pt able to complete basic transfers with RW showing increased basic transfer from evaluation.   ? ?Recommendations for follow up therapy are one component of a multi-disciplinary discharge planning process, led by the attending physician.  Recommendations may be updated based on patient status, additional functional criteria and insurance authorization. ?   ?Follow Up Recommendations ? Home health OT  ?  ?Assistance Recommended at Discharge Set up Supervision/Assistance  ?Patient can return home with the following ? A lot of help with walking and/or transfers;A lot of help with bathing/dressing/bathroom;Assist for transportation ?  ?Equipment Recommendations ? Wheelchair (measurements OT);Wheelchair cushion (measurements OT);Hospital bed;BSC/3in1  ?  ?Recommendations for Other Services Other (comment) ? ?  ?Precautions / Restrictions Precautions ?Precautions: Fall ?Precaution Comments: R flank Drain/ now Jp Drain  ? ? ?  ? ?Mobility Bed Mobility ?Overal bed mobility: Needs Assistance ?Bed Mobility: Rolling, Supine to Sit ?Rolling: Supervision ?  ?Supine to sit: Min assist ?  ?  ?General bed mobility  comments: mod cues for sequence with HOB 45 and able to progress to static eob sitting ?  ? ?Transfers ?Overall transfer level: Needs assistance ?Equipment used: Rolling walker (2 wheels) ?Transfers: Sit to/from Stand ?Sit to Stand: Min assist, Min guard ?  ?  ?  ?  ?  ?General transfer comment: bed elevated surface ?  ?  ?Balance Overall balance assessment: Mild deficits observed, not formally tested ?  ?  ?  ?  ?  ?  ?  ?  ?  ?  ?  ?  ?  ?  ?  ?  ?  ?  ?   ? ?ADL either performed or assessed with clinical judgement  ? ?ADL Overall ADL's : Needs assistance/impaired ?  ?  ?Grooming: Wash/dry hands;Wash/dry face;Set up;Sitting ?  ?  ?  ?  ?  ?  ?  ?Lower Body Dressing: Maximal assistance;Sit to/from stand ?Lower Body Dressing Details (indicate cue type and reason): don socks ?Toilet Transfer: Min guard;Rolling walker (2 wheels) ?  ?  ?  ?  ?  ?Functional mobility during ADLs: Min guard;Rolling walker (2 wheels) ?General ADL Comments: pt progressed from bed sit<>stand then sitting. pt progressed to look out the window and then to chair sitting. ?  ? ?Extremity/Trunk Assessment Upper Extremity Assessment ?Upper Extremity Assessment: Overall WFL for tasks assessed ?  ?  ?  ?  ?  ? ?Vision   ?  ?  ?Perception   ?  ?Praxis   ?  ? ?Cognition Arousal/Alertness: Awake/alert ?Behavior During Therapy: Flat affect ?Overall Cognitive Status: Within Functional Limits for tasks assessed ?  ?  ?  ?  ?  ?  ?  ?  ?  ?  ?  ?  ?  ?  ?  ?  ?  ?  ?  ?   ?  Exercises   ? ?  ?Shoulder Instructions   ? ? ?  ?General Comments recommendation for geo mat for sitting in chair.  ? ? ?Pertinent Vitals/ Pain       Pain Assessment ?Pain Assessment: Faces ?Faces Pain Scale: Hurts little more ?Pain Location: feet ?Pain Descriptors / Indicators: Discomfort, Numbness ?Pain Intervention(s): Monitored during session, Repositioned ? ?Home Living   ?  ?  ?  ?  ?  ?  ?  ?  ?  ?  ?  ?  ?  ?  ?  ?  ?  ?  ? ?  ?Prior Functioning/Environment    ?  ?  ?  ?    ? ?Frequency ? Min 2X/week  ? ? ? ? ?  ?Progress Toward Goals ? ?OT Goals(current goals can now be found in the care plan section) ? Progress towards OT goals: Progressing toward goals ? ?Acute Rehab OT Goals ?Patient Stated Goal: to be able to see out ?OT Goal Formulation: With patient/family ?Time For Goal Achievement: 08/06/21 ?Potential to Achieve Goals: Good ?ADL Goals ?Pt Will Transfer to Toilet: with min assist;stand pivot transfer;bedside commode ?Additional ADL Goal #1: Pt will complete bed mobility min (A) as precursor to adls. ?Additional ADL Goal #2: pt will demonstrate w/c transfer min (A) as precursor to adls  ?Plan Discharge plan remains appropriate   ? ?Co-evaluation ? ? ?   ?  ?  ?  ?  ? ?  ?AM-PAC OT "6 Clicks" Daily Activity     ?Outcome Measure ? ? Help from another person eating meals?: A Little ?Help from another person taking care of personal grooming?: A Little ?Help from another person toileting, which includes using toliet, bedpan, or urinal?: A Lot ?Help from another person bathing (including washing, rinsing, drying)?: A Lot ?Help from another person to put on and taking off regular upper body clothing?: A Little ?Help from another person to put on and taking off regular lower body clothing?: A Lot ?6 Click Score: 15 ? ?  ?End of Session Equipment Utilized During Treatment: Rolling walker (2 wheels) ? ?OT Visit Diagnosis: Unsteadiness on feet (R26.81);Muscle weakness (generalized) (M62.81) ?  ?Activity Tolerance Patient tolerated treatment well ?  ?Patient Left in chair;with call bell/phone within reach;with chair alarm set;with family/visitor present ?  ?Nurse Communication Mobility status;Precautions ?  ? ?   ? ?Time: 5366-4403 ?OT Time Calculation (min): 24 min ? ?Charges: OT General Charges ?$OT Visit: 1 Visit ?OT Treatments ?$Self Care/Home Management : 23-37 mins ? ? ?Brynn, OTR/L  ?Acute Rehabilitation Services ?Office: [phone] ?. ? ? ?Jeri Modena ?07/31/2021, 12:09 PM ?

## 2021-07-31 NOTE — Progress Notes (Signed)
? ? ? ?Supervising Physician: Juliet Rude ? ?Patient Status:  [facility] - In-pt ? ?Chief Complaint: ? ?Acute cholecystitis, intra-abdominal fluid collection, drain placed 07/25/21 ? ?Subjective: ? ?No abdominal pain, complaining of numbness in feet ? ?Allergies: ?Penicillins ? ?Medications: ?Prior to Admission medications   ?Medication Sig Start Date End Date Taking? Authorizing Provider  ?ACCU-CHEK AVIVA PLUS test strip As needed 05/09/21   [name], MD  ?Accu-Chek Softclix Lancets lancets As needed 04/23/21   [name], MD  ?albuterol (VENTOLIN HFA) 108 (90 Base) MCG/ACT inhaler Inhale 2 puffs into the lungs every 6 (six) hours as needed for wheezing or shortness of breath. ?Patient not taking: Reported on 07/10/2021 07/02/21   Flora Lipps, MD  ?Alcohol Swabs PADS USE AS DIRECTED 05/18/21   [name], MD  ?ALPRAZolam Duanne Moron) 0.5 MG tablet Take 1 tablet (0.5 mg total) by mouth daily as needed for anxiety. 11/23/20   Owens Shark, NP  ?amLODipine (NORVASC) 5 MG tablet Take 5 mg by mouth daily. ?Patient not taking: Reported on 07/10/2021 03/30/21   [provider]  ?ascorbic acid (VITAMIN C) 500 MG tablet Take 1 tablet (500 mg total) by mouth daily. ?Patient not taking: Reported on 07/10/2021 07/03/21 08/02/21  Flora Lipps, MD  ?aspirin EC 81 MG tablet Take 81 mg by mouth every morning.    [provider]  ?Blood Glucose Monitoring Suppl (ACCU-CHEK GUIDE) w/Device KIT As needed 05/11/21   [name], MD  ?cyanocobalamin (,VITAMIN B-12,) 1000 MCG/ML injection Inject 1 mL (1,000 mcg total) into the muscle every 30 (thirty) days. 01/11/21   [name], MD  ?docusate sodium (COLACE) 100 MG capsule Take 100 mg by mouth 2 (two) times daily as needed (constipation).    [provider]  ?guaiFENesin-dextromethorphan (ROBITUSSIN DM) 100-10 MG/5ML syrup Take 10 mLs by mouth every 4 (four) hours as needed for cough. ?Patient not taking: Reported on 07/10/2021 07/02/21   Flora Lipps, MD  ?levothyroxine (SYNTHROID) 125 MCG tablet TAKE 1 TABLET ON AN EMPTY STOMACH IN THE MORNING ?Patient taking differently: Take 125 mcg by mouth daily before breakfast. 06/04/21   [name], MD  ?lidocaine-prilocaine (EMLA) cream APPLY 1 APPLICATION TOPICALLY AS NEEDED. ?Patient taking differently: Apply 1 application. topically daily as needed (numbing). 07/04/21   Ladell Pier, MD  ?omeprazole (PRILOSEC) 20 MG capsule TAKE 1 CAPSULE BY MOUTH EVERY DAY ?Patient taking differently: Take 20 mg by mouth daily. 04/18/21   [name], MD  ?ondansetron (ZOFRAN) 8 MG tablet Take 1 tablet (8 mg total) by mouth 2 (two) times daily as needed (Nausea or vomiting). ?Patient not taking: Reported on 07/10/2021 09/12/20   Truitt Merle, MD  ?prochlorperazine (COMPAZINE) 10 MG tablet Take 1 tablet (10 mg total) by mouth every 6 (six) hours as needed (Nausea or vomiting). 09/12/20   Truitt Merle, MD  ?zinc sulfate 220 (50 Zn) MG capsule Take 1 capsule (220 mg total) by mouth daily. ?Patient not taking: Reported on 07/06/2021 07/03/21 08/02/21  Flora Lipps, MD  ? ? ? ?Vital Signs: ?BP 114/72 (BP Location: Right Arm)   Pulse 94   Temp 98.7 ?F (37.1 ?C) (Oral)   Resp 18   Ht '5\' 3"'  (1.6 m)   Wt 170 lb 3.1 oz (77.2 kg)   SpO2 100%   BMI 30.15 kg/m?  ? ?Physical Exam ?Vitals reviewed.  ?Constitutional:   ?   Appearance: She is ill-appearing.  ?HENT:  ?  Head: Normocephalic and atraumatic.  ?   Mouth/Throat:  ?   Pharynx: Oropharynx is clear.  ?Eyes:  ?   Extraocular Movements: Extraocular movements intact.  ?Cardiovascular:  ?   Rate and Rhythm: Normal rate.  ?   Pulses: Normal pulses.  ?Pulmonary:  ?   Effort: Pulmonary effort is normal.  ?Abdominal:  ?   General: There is no distension.  ?   Palpations: Abdomen is soft.  ?   Tenderness: There is no abdominal tenderness.  ?Skin: ?   General: Skin is dry.  ?Neurological:  ?   General: No focal deficit present.  ?   Mental Status: She is alert and oriented to person,  place, and time.  ?Psychiatric:     ?   Mood and Affect: Mood normal.     ?   Thought Content: Thought content normal.  ? ?24 hour output:  ?Output by Drain (mL) 07/29/21 0700 - 07/29/21 1459 07/29/21 1500 - 07/29/21 2259 07/29/21 2300 - 07/30/21 0659 07/30/21 0700 - 07/30/21 1459 07/30/21 1500 - 07/30/21 2259 07/30/21 2300 - 07/31/21 0659 07/31/21 0700 - 07/31/21 1253  ?Closed System Drain RUQ 10.2 Fr.   90   60 0  ?Closed System Drain 1 Right RUQ Bulb (JP) 12 Fr. 60  60  57 20 10  ? ? ?Current examination: ?Flushes/aspirates easily.  ?Insertion site unremarkable. ?Suture and stat lock in place. ?Dressed appropriately.  ?RUQ 10.2Fr OP with clear viscous OP ?RUQ 12 Fr with yellow with debris ? ?Imaging: ?No results found. ? ?Labs: ? ?CBC: ?Recent Labs  ?  07/28/21 ?7867 07/29/21 ?5449 07/30/21 ?2010 07/31/21 ?0712  ?WBC 20.7* 20.1* 15.7* 12.5*  ?HGB 10.3* 10.2* 10.0* 9.8*  ?HCT 32.6* 31.0* 31.5* 30.4*  ?PLT 182 173 171 152  ? ? ?COAGS: ?Recent Labs  ?  10/14/20 ?1457 06/30/21 ?1975 07/18/21 ?1503 07/19/21 ?8832 07/24/21 ?1306  ?INR 1.1 1.1 1.2 1.3* 1.2  ?APTT 45*  --  28  --   --   ? ? ?BMP: ?Recent Labs  ?  07/28/21 ?5498 07/29/21 ?2641 07/30/21 ?5830 07/31/21 ?9407  ?NA 132* 132* 134* 132*  ?K 3.5 3.4* 3.7 3.5  ?CL 108 105 107 108  ?CO2 19* 20* 19* 19*  ?GLUCOSE 132* 121* 105* 123*  ?BUN '10 8 8 8  ' ?CALCIUM 7.4* 7.3* 7.5* 7.4*  ?CREATININE 0.54 0.60 0.58 0.60  ?GFRNONAA >60 >60 >60 >60  ? ? ?LIVER FUNCTION TESTS: ?Recent Labs  ?  07/24/21 ?0433 07/25/21 ?0215 07/26/21 ?0322 07/27/21 ?0329  ?BILITOT 0.9 0.9 0.8 0.3  ?AST '21 26 28 25  ' ?ALT '13 14 12 13  ' ?ALKPHOS 183* 204* 188* 171*  ?PROT 4.9* 5.0* 4.7* 4.7*  ?ALBUMIN 1.7* 1.7* 1.6* 1.6*  ? ? ?Assessment and Plan: ? ?Perc chole drain and perihepatic drains: ?Continue TID flushes with 5 cc NS. ?Record output Q shift. ?Dressing changes QD or PRN if soiled.  ?Call IR APP or on call IR MD if difficulty flushing or sudden change in drain output.  ?Repeat imaging/possible  drain injection once output < 10 mL/QD (excluding flush material.) ? ?Discharge planning: ?Please contact IR APP or on call IR MD prior to patient d/c to ensure appropriate follow up plans are in place. Typically patient will follow up with IR clinic 10-14 days post d/c for repeat imaging/possible drain injection. IR scheduler will contact patient with date/time of appointment. Patient will need to flush drain QD with 5 cc NS, record output QD, dressing  changes every 2-3 days or earlier if soiled.  ? ?IR will continue to follow - please call with questions or concerns. ? ?Electronically Signed: ?Pasty Spillers, PA ?07/31/2021, 12:24 PM ? ? ?I spent a total of 25 Minutes at the the patient's bedside AND on the patient's hospital floor or unit, greater than 50% of which was counseling/coordinating care for drain management ? ? ? ? ? ?

## 2021-07-31 NOTE — Progress Notes (Signed)
?PROGRESS NOTE ? ? ? ?[name]  IRJ:[id number] DOB: [birth year] DOA: 07/18/2021 ?PCP: [name], MD  ? ? ?Brief Narrative:  ? ?[name] is a 76 year old female with past medical history significant for pancreatic cancer on chemotherapy, type 2 diabetes mellitus, hypothyroidism, GERD who presented to [facility] ED on 5/3 via EMS with confusion and fever.  Patient also has been complaining of generalized abdominal pain with intermittent nausea.  ? ?In the ED, sodium 129, potassium 3.6, chloride 102, CO2 16, glucose 191, BUN 11, creatinine 0.77.  Lactic acid 1.6.  WBC 7.7, hemoglobin 10.9.  TSH 2.251.  CT abdomen/pelvis with diffuse gallbladder wall thickening and edema with apparent area of discontinuity posterior aspect of the distal gallbladder wall with concern for gallbladder rupture or perforation, small ascites, sigmoid diverticulosis, ill-defined known pancreatic mass encasing the portal vein and portal splenic confluence with collateralization, several small bilateral pulmonary nodules, hypodense lesion inferior right lobe.  General surgery was consulted.  Hospital service consulted for further evaluation and management of altered mental status and concern for acute cholecystitis. ? ?Assessment & Plan: ?  ?Acute metabolic encephalopathy ?Etiology likely secondary to severe sepsis with bacteremia/UTI and acute cholecystitis.  Now resolved and appears to be at baseline. ?--Supportive care and treatment as below ? ?Severe sepsis, POA ?Morganella morganii/E. coli bacteremia ?E. coli/Enterococcus faecium UTI ?Acute cholecystitis s/p cholecystostomy ?Patient presenting to ED with confusion and found to have concerns for acute cholecystitis on CT abdomen/pelvis.  Initially started on empiric antibiotics with vancomycin, metronidazole, cefepime/aztreonam.  Evaluated by general surgery with recommendations of IR placement of cholecystostomy.  Underwent IR cholecystostomy placement on 07/20/2021 with culture  positive for E. coli, Morganella morganii, Bacteroides ovatus. Repeat CT abdomen/pelvis notable for new subcapsular fluid collection near the liver spanning 17 cm. ?--Infectious disease following, appreciate assistance ?--WBC 7.7>>24.7>20.2>21.5>17.2>17.5>20.7>20.4>15.7>12.5 ?--Drain culture 5/10: No organisms on Gram stain, no growth x 5 days ?--RUQ drain w/ 77 mL output last 24h ?--JP drain with 60 mL output last 24h ?--Cefepime 2 g IV every 12 hours ?--Metronidazole 500 mg PO q12h ?--CBC daily ?--Continue monitor drain output ?--ID recommending 2 weeks cefepime/metronidazole from 5/10 ? ?Left lower lobe pulmonary embolism ?CTA chest 5/9 with bilateral segmental and subsegmental pulmonary emboli.  Vascular duplex ultrasound bilateral lower extremities 5/9 with findings consistent with acute DVT right common femoral vein, acute DVT left common femoral vein and left proximal profunda vein; common femoral vein obstruction does not appear to extend above inguinal ligament. ?--Transition heparin drip to Eliquis today ? ?Right pleural effusion ?Underwent IR thoracentesis on 07/24/2021 with removal of 350 mL clear yellow fluid.  Positive lights criteria consistent with exudative pleural effusion.  Pleural fluid cytology negative for malignant cells.  Repeat chest x-ray 5/12 shows improvement in the right-sided pleural effusion with decreased atelectasis. ?--Pleural fluid culture: No growth x 3 days ? ?Type 2 diabetes mellitus ?Hemoglobin A1c 6.3 on 07/10/2021, well controlled. ?--SSI for coverage ?--CBGs qAC/HS ? ?Pancreatic cancer ?Follows with medical oncology outpatient, Dr. Benay Spice. ? ?Hypothyroidism ?--Levothyroxine 100 mcg p.o. daily ? ?Generalized anxiety disorder ?--Alprazolam 0.25 PO daily PRN ? ?Essential hypertension ?--Amlodipine 5 mg p.o. daily ? ?Hypomagnesemia ?Repleted.  Magnesium 1.9 this morning ? ?Hypokalemia ?Repleted.  Potassium 3.5 this morning, ? ? ?Hx COVID-19 viral infection 06/20/2021 ?Oxygenating  well on room air. ? ?Ethics: ?Given patient's multiple comorbidities, debility and clinical decline, palliative care was consulted for assistance with goals of care and medical decision making.  Seen by palliative care on  5/9 and recommendations are to continue with full code/full scope of treatment.  Outpatient palliative care to follow-up at cancer center arranged following discharge. ? ?Physical deconditioning: ?--Continue PT/OT efforts while inpatient ?-- Excepted at [facility], pending insurance authorization ? ? ?DVT prophylaxis: SCDs Start: 07/18/21 1925 ? ?  Code Status: Full Code ?Family Communication: Updated patient's spouse this morning ? ?Disposition Plan:  ?Level of care: Med-Surg ?Status is: Inpatient ?Remains inpatient appropriate because: Pending insurance authorization for [facility] ?  ? ?Consultants:  ?General surgery, signed off 5/6 ?Infectious disease - signed off 5/15 ?Palliative care ?Interventional radiology ? ?Procedures:  ?IR cholecystostomy placement ?IR drain placement 5/10 ? ?Antimicrobials:  ?Vancomycin 5/3 - 5/3 ?Aztreonam 5/3 - 5/3 ?Ceftriaxone 5/4 - 5/6 ?Cefepime 5/3, 5/6>> ?Metronidazole 5/3>> ? ? ?Subjective: ?Patient seen examined bedside, resting currently.  Spouse present.  Seen by oncology, Dr. Benay Spice earlier this morning.  No specific complaints.  Awaiting insurance authorization for SNF placement.  Continues with good output from JP and right upper quadrant drain.  Spouse and patient once again this morning continue to ask when the drains can be removed, discussed with them on multiple occasions that drains will remain in place for some time especially the cholecystostomy drain until output is minimal and repeat imaging outpatient completed.  Denies headache, no chest pain, no palpitations, no shortness of breath, no nausea/vomiting/diarrhea, no fever/chills/night sweats, no paresthesias, no fatigue.  No acute concerns overnight per nursing  staff. ? ?Objective: ?Vitals:  ? 07/30/21 1826 07/30/21 2040 07/31/21 0421 07/31/21 0829  ?BP: (!) 108/97 118/63 112/71 114/72  ?Pulse: 95 85 81 94  ?Resp: '17 18 18 18  '$ ?Temp: 98 ?F (36.7 ?C) 98.1 ?F (36.7 ?C) 98.5 ?F (36.9 ?C) 98.7 ?F (37.1 ?C)  ?TempSrc: Oral Oral Oral Oral  ?SpO2: 99% 99% 97% 100%  ?Weight:      ?Height:      ? ? ?Intake/Output Summary (Last 24 hours) at 07/31/2021 1027 ?Last data filed at 07/31/2021 0600 ?Gross per 24 hour  ?Intake 1650.99 ml  ?Output 1137 ml  ?Net 513.99 ml  ? ?Filed Weights  ? 07/24/21 0601 07/28/21 0500 07/30/21 0659  ?Weight: 78.4 kg 78.8 kg 77.2 kg  ? ? ?Examination: ? ?Physical Exam: ?GEN: NAD, alert and oriented x 3, chronically ill in appearance ?HEENT: NCAT, PERRL, EOMI, sclera clear, MMM ?PULM: CTAB w/o wheezes/crackles, normal respiratory effort, on room air ?CV: RRR w/o M/G/R ?GI: abd soft, NTND, NABS, no R/G/M, abdominal drains x 2 noted ?MSK: no peripheral edema, muscle strength globally intact 5/5 bilateral upper/lower extremities ?NEURO: CN II-XII intact, no focal deficits, sensation to light touch intact ?PSYCH: Depressed mood, flat affect ?Integumentary: dry/intact, no rashes or wounds ? ? ? ?Data Reviewed: I have personally reviewed following labs and imaging studies ? ?CBC: ?Recent Labs  ?Lab 07/27/21 ?0329 07/28/21 ?2694 07/29/21 ?8546 07/30/21 ?2703 07/31/21 ?5009  ?WBC 17.5* 20.7* 20.1* 15.7* 12.5*  ?NEUTROABS 14.9* 19.5* 16.9* 14.3* 9.4*  ?HGB 10.3* 10.3* 10.2* 10.0* 9.8*  ?HCT 31.8* 32.6* 31.0* 31.5* 30.4*  ?MCV 93.5 95.6 93.9 96.3 96.8  ?PLT 214 182 173 171 152  ? ?Basic Metabolic Panel: ?Recent Labs  ?Lab 07/27/21 ?0329 07/28/21 ?3818 07/29/21 ?2993 07/30/21 ?7169 07/31/21 ?6789  ?NA 133* 132* 132* 134* 132*  ?K 3.4* 3.5 3.4* 3.7 3.5  ?CL 106 108 105 107 108  ?CO2 22 19* 20* 19* 19*  ?GLUCOSE 144* 132* 121* 105* 123*  ?BUN '9 10 8 8 8  '$ ?  CREATININE 0.60 0.54 0.60 0.58 0.60  ?CALCIUM 7.3* 7.4* 7.3* 7.5* 7.4*  ?MG 1.9 1.7 2.1 2.1 1.9  ? ?GFR: ?Estimated  Creatinine Clearance: 57 mL/min (by C-G formula based on SCr of 0.6 mg/dL). ?Liver Function Tests: ?Recent Labs  ?Lab 07/25/21 ?0215 07/26/21 ?0322 07/27/21 ?0329  ?AST '26 28 25  '$ ?ALT '14 12 13  '$ ?ALKPHOS 204* 188* 171*  ?BILITOT 0

## 2021-08-01 ENCOUNTER — Other Ambulatory Visit: Payer: Self-pay | Admitting: Radiology

## 2021-08-01 ENCOUNTER — Encounter: Payer: Self-pay | Admitting: Oncology

## 2021-08-01 ENCOUNTER — Other Ambulatory Visit (HOSPITAL_COMMUNITY): Payer: Self-pay

## 2021-08-01 DIAGNOSIS — E8809 Other disorders of plasma-protein metabolism, not elsewhere classified: Secondary | ICD-10-CM | POA: Diagnosis not present

## 2021-08-01 DIAGNOSIS — Z434 Encounter for attention to other artificial openings of digestive tract: Secondary | ICD-10-CM | POA: Diagnosis not present

## 2021-08-01 DIAGNOSIS — E119 Type 2 diabetes mellitus without complications: Secondary | ICD-10-CM | POA: Diagnosis not present

## 2021-08-01 DIAGNOSIS — K819 Cholecystitis, unspecified: Secondary | ICD-10-CM | POA: Diagnosis not present

## 2021-08-01 DIAGNOSIS — K5730 Diverticulosis of large intestine without perforation or abscess without bleeding: Secondary | ICD-10-CM | POA: Diagnosis not present

## 2021-08-01 DIAGNOSIS — G250 Essential tremor: Secondary | ICD-10-CM | POA: Diagnosis not present

## 2021-08-01 DIAGNOSIS — G309 Alzheimer's disease, unspecified: Secondary | ICD-10-CM | POA: Diagnosis present

## 2021-08-01 DIAGNOSIS — Z66 Do not resuscitate: Secondary | ICD-10-CM | POA: Diagnosis not present

## 2021-08-01 DIAGNOSIS — Z7901 Long term (current) use of anticoagulants: Secondary | ICD-10-CM | POA: Diagnosis not present

## 2021-08-01 DIAGNOSIS — K820 Obstruction of gallbladder: Secondary | ICD-10-CM | POA: Diagnosis not present

## 2021-08-01 DIAGNOSIS — E118 Type 2 diabetes mellitus with unspecified complications: Secondary | ICD-10-CM | POA: Diagnosis not present

## 2021-08-01 DIAGNOSIS — E785 Hyperlipidemia, unspecified: Secondary | ICD-10-CM | POA: Diagnosis present

## 2021-08-01 DIAGNOSIS — F5102 Adjustment insomnia: Secondary | ICD-10-CM | POA: Diagnosis not present

## 2021-08-01 DIAGNOSIS — M1990 Unspecified osteoarthritis, unspecified site: Secondary | ICD-10-CM | POA: Diagnosis not present

## 2021-08-01 DIAGNOSIS — I8290 Acute embolism and thrombosis of unspecified vein: Secondary | ICD-10-CM | POA: Diagnosis not present

## 2021-08-01 DIAGNOSIS — Z515 Encounter for palliative care: Secondary | ICD-10-CM | POA: Diagnosis not present

## 2021-08-01 DIAGNOSIS — F32A Depression, unspecified: Secondary | ICD-10-CM | POA: Diagnosis not present

## 2021-08-01 DIAGNOSIS — K810 Acute cholecystitis: Secondary | ICD-10-CM | POA: Diagnosis not present

## 2021-08-01 DIAGNOSIS — K651 Peritoneal abscess: Secondary | ICD-10-CM | POA: Diagnosis not present

## 2021-08-01 DIAGNOSIS — I2699 Other pulmonary embolism without acute cor pulmonale: Secondary | ICD-10-CM | POA: Diagnosis not present

## 2021-08-01 DIAGNOSIS — A419 Sepsis, unspecified organism: Secondary | ICD-10-CM | POA: Diagnosis not present

## 2021-08-01 DIAGNOSIS — Z8616 Personal history of COVID-19: Secondary | ICD-10-CM | POA: Diagnosis not present

## 2021-08-01 DIAGNOSIS — C801 Malignant (primary) neoplasm, unspecified: Secondary | ICD-10-CM | POA: Diagnosis not present

## 2021-08-01 DIAGNOSIS — R1012 Left upper quadrant pain: Secondary | ICD-10-CM | POA: Diagnosis not present

## 2021-08-01 DIAGNOSIS — I2694 Multiple subsegmental pulmonary emboli without acute cor pulmonale: Secondary | ICD-10-CM | POA: Diagnosis not present

## 2021-08-01 DIAGNOSIS — I2693 Single subsegmental pulmonary embolism without acute cor pulmonale: Secondary | ICD-10-CM | POA: Diagnosis not present

## 2021-08-01 DIAGNOSIS — Z5181 Encounter for therapeutic drug level monitoring: Secondary | ICD-10-CM | POA: Diagnosis not present

## 2021-08-01 DIAGNOSIS — M6250 Muscle wasting and atrophy, not elsewhere classified, unspecified site: Secondary | ICD-10-CM | POA: Diagnosis not present

## 2021-08-01 DIAGNOSIS — Z1331 Encounter for screening for depression: Secondary | ICD-10-CM | POA: Diagnosis not present

## 2021-08-01 DIAGNOSIS — K650 Generalized (acute) peritonitis: Secondary | ICD-10-CM | POA: Diagnosis not present

## 2021-08-01 DIAGNOSIS — C259 Malignant neoplasm of pancreas, unspecified: Secondary | ICD-10-CM

## 2021-08-01 DIAGNOSIS — K8689 Other specified diseases of pancreas: Secondary | ICD-10-CM | POA: Diagnosis not present

## 2021-08-01 DIAGNOSIS — Z8507 Personal history of malignant neoplasm of pancreas: Secondary | ICD-10-CM | POA: Diagnosis not present

## 2021-08-01 DIAGNOSIS — Z7401 Bed confinement status: Secondary | ICD-10-CM | POA: Diagnosis not present

## 2021-08-01 DIAGNOSIS — R64 Cachexia: Secondary | ICD-10-CM | POA: Diagnosis present

## 2021-08-01 DIAGNOSIS — R6520 Severe sepsis without septic shock: Secondary | ICD-10-CM | POA: Diagnosis not present

## 2021-08-01 DIAGNOSIS — R1013 Epigastric pain: Secondary | ICD-10-CM | POA: Diagnosis not present

## 2021-08-01 DIAGNOSIS — K219 Gastro-esophageal reflux disease without esophagitis: Secondary | ICD-10-CM | POA: Diagnosis not present

## 2021-08-01 DIAGNOSIS — R5381 Other malaise: Secondary | ICD-10-CM | POA: Diagnosis not present

## 2021-08-01 DIAGNOSIS — J9811 Atelectasis: Secondary | ICD-10-CM | POA: Diagnosis not present

## 2021-08-01 DIAGNOSIS — N281 Cyst of kidney, acquired: Secondary | ICD-10-CM | POA: Diagnosis not present

## 2021-08-01 DIAGNOSIS — R279 Unspecified lack of coordination: Secondary | ICD-10-CM | POA: Diagnosis not present

## 2021-08-01 DIAGNOSIS — M2550 Pain in unspecified joint: Secondary | ICD-10-CM | POA: Diagnosis not present

## 2021-08-01 DIAGNOSIS — K8020 Calculus of gallbladder without cholecystitis without obstruction: Secondary | ICD-10-CM | POA: Diagnosis not present

## 2021-08-01 DIAGNOSIS — I82401 Acute embolism and thrombosis of unspecified deep veins of right lower extremity: Secondary | ICD-10-CM | POA: Diagnosis not present

## 2021-08-01 DIAGNOSIS — M159 Polyosteoarthritis, unspecified: Secondary | ICD-10-CM | POA: Diagnosis not present

## 2021-08-01 DIAGNOSIS — M6281 Muscle weakness (generalized): Secondary | ICD-10-CM | POA: Diagnosis not present

## 2021-08-01 DIAGNOSIS — E039 Hypothyroidism, unspecified: Secondary | ICD-10-CM | POA: Diagnosis not present

## 2021-08-01 DIAGNOSIS — R531 Weakness: Secondary | ICD-10-CM | POA: Diagnosis not present

## 2021-08-01 DIAGNOSIS — R630 Anorexia: Secondary | ICD-10-CM | POA: Diagnosis not present

## 2021-08-01 DIAGNOSIS — B370 Candidal stomatitis: Secondary | ICD-10-CM | POA: Diagnosis not present

## 2021-08-01 DIAGNOSIS — R1033 Periumbilical pain: Secondary | ICD-10-CM | POA: Diagnosis not present

## 2021-08-01 DIAGNOSIS — F39 Unspecified mood [affective] disorder: Secondary | ICD-10-CM | POA: Diagnosis not present

## 2021-08-01 DIAGNOSIS — K7689 Other specified diseases of liver: Secondary | ICD-10-CM | POA: Diagnosis not present

## 2021-08-01 DIAGNOSIS — R410 Disorientation, unspecified: Secondary | ICD-10-CM | POA: Diagnosis not present

## 2021-08-01 DIAGNOSIS — F0284 Dementia in other diseases classified elsewhere, unspecified severity, with anxiety: Secondary | ICD-10-CM | POA: Diagnosis present

## 2021-08-01 DIAGNOSIS — R2681 Unsteadiness on feet: Secondary | ICD-10-CM | POA: Diagnosis not present

## 2021-08-01 DIAGNOSIS — R1312 Dysphagia, oropharyngeal phase: Secondary | ICD-10-CM | POA: Diagnosis not present

## 2021-08-01 DIAGNOSIS — I10 Essential (primary) hypertension: Secondary | ICD-10-CM | POA: Diagnosis not present

## 2021-08-01 DIAGNOSIS — C251 Malignant neoplasm of body of pancreas: Secondary | ICD-10-CM | POA: Diagnosis not present

## 2021-08-01 DIAGNOSIS — K449 Diaphragmatic hernia without obstruction or gangrene: Secondary | ICD-10-CM | POA: Diagnosis not present

## 2021-08-01 DIAGNOSIS — D649 Anemia, unspecified: Secondary | ICD-10-CM | POA: Diagnosis not present

## 2021-08-01 DIAGNOSIS — C799 Secondary malignant neoplasm of unspecified site: Secondary | ICD-10-CM | POA: Diagnosis not present

## 2021-08-01 DIAGNOSIS — R110 Nausea: Secondary | ICD-10-CM | POA: Diagnosis not present

## 2021-08-01 DIAGNOSIS — R1084 Generalized abdominal pain: Secondary | ICD-10-CM | POA: Diagnosis not present

## 2021-08-01 DIAGNOSIS — E871 Hypo-osmolality and hyponatremia: Secondary | ICD-10-CM | POA: Diagnosis not present

## 2021-08-01 DIAGNOSIS — J90 Pleural effusion, not elsewhere classified: Secondary | ICD-10-CM | POA: Diagnosis not present

## 2021-08-01 DIAGNOSIS — Z7189 Other specified counseling: Secondary | ICD-10-CM | POA: Diagnosis not present

## 2021-08-01 DIAGNOSIS — C787 Secondary malignant neoplasm of liver and intrahepatic bile duct: Secondary | ICD-10-CM | POA: Diagnosis not present

## 2021-08-01 DIAGNOSIS — R109 Unspecified abdominal pain: Secondary | ICD-10-CM | POA: Diagnosis present

## 2021-08-01 DIAGNOSIS — E43 Unspecified severe protein-calorie malnutrition: Secondary | ICD-10-CM | POA: Diagnosis not present

## 2021-08-01 DIAGNOSIS — K5720 Diverticulitis of large intestine with perforation and abscess without bleeding: Secondary | ICD-10-CM | POA: Diagnosis present

## 2021-08-01 DIAGNOSIS — G253 Myoclonus: Secondary | ICD-10-CM | POA: Diagnosis not present

## 2021-08-01 DIAGNOSIS — N179 Acute kidney failure, unspecified: Secondary | ICD-10-CM | POA: Diagnosis not present

## 2021-08-01 DIAGNOSIS — R1011 Right upper quadrant pain: Secondary | ICD-10-CM | POA: Diagnosis not present

## 2021-08-01 DIAGNOSIS — F411 Generalized anxiety disorder: Secondary | ICD-10-CM | POA: Diagnosis not present

## 2021-08-01 DIAGNOSIS — R41841 Cognitive communication deficit: Secondary | ICD-10-CM | POA: Diagnosis not present

## 2021-08-01 DIAGNOSIS — E860 Dehydration: Secondary | ICD-10-CM | POA: Diagnosis not present

## 2021-08-01 DIAGNOSIS — Z452 Encounter for adjustment and management of vascular access device: Secondary | ICD-10-CM | POA: Diagnosis not present

## 2021-08-01 DIAGNOSIS — Z95828 Presence of other vascular implants and grafts: Secondary | ICD-10-CM | POA: Diagnosis not present

## 2021-08-01 DIAGNOSIS — E1140 Type 2 diabetes mellitus with diabetic neuropathy, unspecified: Secondary | ICD-10-CM | POA: Diagnosis not present

## 2021-08-01 DIAGNOSIS — G9341 Metabolic encephalopathy: Secondary | ICD-10-CM | POA: Diagnosis not present

## 2021-08-01 DIAGNOSIS — Z87440 Personal history of urinary (tract) infections: Secondary | ICD-10-CM | POA: Diagnosis not present

## 2021-08-01 LAB — CBC WITH DIFFERENTIAL/PLATELET
Abs Immature Granulocytes: 0.11 10*3/uL — ABNORMAL HIGH (ref 0.00–0.07)
Basophils Absolute: 0 10*3/uL (ref 0.0–0.1)
Basophils Relative: 0 %
Eosinophils Absolute: 0.1 10*3/uL (ref 0.0–0.5)
Eosinophils Relative: 1 %
HCT: 30.5 % — ABNORMAL LOW (ref 36.0–46.0)
Hemoglobin: 9.6 g/dL — ABNORMAL LOW (ref 12.0–15.0)
Immature Granulocytes: 1 %
Lymphocytes Relative: 17 %
Lymphs Abs: 1.6 10*3/uL (ref 0.7–4.0)
MCH: 30.5 pg (ref 26.0–34.0)
MCHC: 31.5 g/dL (ref 30.0–36.0)
MCV: 96.8 fL (ref 80.0–100.0)
Monocytes Absolute: 0.9 10*3/uL (ref 0.1–1.0)
Monocytes Relative: 9 %
Neutro Abs: 6.7 10*3/uL (ref 1.7–7.7)
Neutrophils Relative %: 72 %
Platelets: 153 10*3/uL (ref 150–400)
RBC: 3.15 MIL/uL — ABNORMAL LOW (ref 3.87–5.11)
RDW: 19.2 % — ABNORMAL HIGH (ref 11.5–15.5)
Smear Review: NORMAL
WBC: 9.4 10*3/uL (ref 4.0–10.5)
nRBC: 0 % (ref 0.0–0.2)

## 2021-08-01 LAB — BASIC METABOLIC PANEL
Anion gap: 3 — ABNORMAL LOW (ref 5–15)
BUN: 7 mg/dL — ABNORMAL LOW (ref 8–23)
CO2: 21 mmol/L — ABNORMAL LOW (ref 22–32)
Calcium: 7.4 mg/dL — ABNORMAL LOW (ref 8.9–10.3)
Chloride: 110 mmol/L (ref 98–111)
Creatinine, Ser: 0.61 mg/dL (ref 0.44–1.00)
GFR, Estimated: >60 mL/min (ref >60)
Glucose, Bld: 126 mg/dL — ABNORMAL HIGH (ref 70–99)
Potassium: 3.4 mmol/L — ABNORMAL LOW (ref 3.5–5.1)
Sodium: 134 mmol/L — ABNORMAL LOW (ref 135–145)

## 2021-08-01 LAB — GLUCOSE, CAPILLARY
Glucose-Capillary: 119 mg/dL — ABNORMAL HIGH (ref 70–99)
Glucose-Capillary: 126 mg/dL — ABNORMAL HIGH (ref 70–99)
Glucose-Capillary: 170 mg/dL — ABNORMAL HIGH (ref 70–99)

## 2021-08-01 LAB — MAGNESIUM: Magnesium: 1.9 mg/dL (ref 1.7–2.4)

## 2021-08-01 MED ORDER — TRAMADOL HCL 50 MG PO TABS: 50.0000 mg | ORAL_TABLET | Freq: Four times a day (QID) | ORAL | 0 refills | Status: DC | PRN

## 2021-08-01 MED ORDER — CEFEPIME IV (FOR PTA / DISCHARGE USE ONLY): 2.0000 g | Freq: Two times a day (BID) | INTRAVENOUS | 0 refills | Status: AC

## 2021-08-01 MED ORDER — APIXABAN 5 MG PO TABS
5.0000 mg | ORAL_TABLET | Freq: Two times a day (BID) | ORAL | 0 refills | Status: DC
  Filled 2021-08-01: qty 64, 30d supply, fill #0

## 2021-08-01 MED ORDER — METRONIDAZOLE 500 MG PO TABS
500.0000 mg | ORAL_TABLET | Freq: Two times a day (BID) | ORAL | 0 refills | Status: AC
Start: 2021-08-01 — End: 2021-08-08
  Filled 2021-08-01: qty 14, 7d supply, fill #0

## 2021-08-01 MED ORDER — POTASSIUM CHLORIDE CRYS ER 20 MEQ PO TBCR
40.0000 meq | EXTENDED_RELEASE_TABLET | Freq: Once | ORAL | Status: AC
  Administered 2021-08-01: 40 meq via ORAL
  Filled 2021-08-01: qty 2

## 2021-08-01 MED ORDER — POLYETHYLENE GLYCOL 3350 17 GM/SCOOP PO POWD
17.0000 g | Freq: Every day | ORAL | 0 refills | Status: AC
  Filled 2021-08-01: qty 238, 14d supply, fill #0

## 2021-08-01 MED ORDER — FAMOTIDINE 20 MG PO TABS
20.0000 mg | ORAL_TABLET | Freq: Every day | ORAL | 0 refills | Status: AC
  Filled 2021-08-01: qty 30, 30d supply, fill #0

## 2021-08-01 MED ORDER — HEPARIN SOD (PORK) LOCK FLUSH 100 UNIT/ML IV SOLN
500.0000 [IU] | INTRAVENOUS | Status: AC | PRN
  Administered 2021-08-01: 500 [IU]

## 2021-08-01 MED ORDER — ALPRAZOLAM 0.5 MG PO TABS: 0.5000 mg | ORAL_TABLET | Freq: Every day | ORAL | 0 refills | Status: DC | PRN

## 2021-08-01 MED ORDER — ADULT MULTIVITAMIN W/MINERALS CH: 1.0000 | ORAL_TABLET | Freq: Every day | ORAL | Status: DC

## 2021-08-01 MED ORDER — APIXABAN 5 MG PO TABS
10.0000 mg | ORAL_TABLET | Freq: Two times a day (BID) | ORAL | 0 refills | Status: DC
  Filled 2021-08-01: qty 2, 1d supply, fill #0

## 2021-08-01 NOTE — Progress Notes (Signed)
?  Patient Profile: ?76 y.o. female  with past medical history of DM, TIA, HTN, alcohol abuse, pancreatic cancer currently (actively receiving chemotherapy) Recent COVID PNA  admitted on 07/18/2021 with AMS, fatigue, cough and anorexia. ?  ?In the ED, CT could not exclude gallbladder rupture or perforation. HIDA scan with concern for acute cholecystitis and patient is s/p cholecystostomy 5/5. PMT has been consulted to assist with goals of care conversation.  ? ?Subjective:  ? ?Medical records reviewed including progress notes, labs, imaging, nursing notes, I/O, vital signs. Interval history since last visit with PMT on 5/9 has been noted with RUQ and JP drains still in place, WBC now improved to 9.4 and ID signing off this week. ? ?Patient has also transitioned to Eliquis from heparin ggt. Noted plans to discharge to [facility] after insurance authorization and then resume gemcitabine/Abraxane with Dr. Benay Spice. Patient appears stable without acute palliative needs. ? ? ?Assessment/Plan: ?-Goals remain unchanged, patient desires full code and full scope treatment ?-Patient will continue to benefit from ongoing palliative support after discharge, will follow-up with my colleague Alda Lea NP ?-PMT will sign off, as patient has been referred to outpatient palliative care at outpatient cancer center and goals of care remain clear  ?-Please do not hesitate to re-engage PMT if family desires [place] discussions or other urgent needs arise ? ? ? ?Total time: ?I spent 25 minutes in the care of the patient today in the above activities and documenting the encounter. ? ? ?Dorthy Cooler, PA-C ?Palliative Medicine Team ?Team phone # [phone] ? ?Thank you for allowing the Palliative Medicine Team to assist in the care of this patient. Please utilize secure chat with additional questions, if there is no response within 30 minutes please call the above phone number. ? ?Palliative Medicine Team providers  are available by phone from 7am to 7pm daily and can be reached through the team cell phone.  ?Should this patient require assistance outside of these hours, please call the patient's attending physician.  ?

## 2021-08-01 NOTE — Plan of Care (Signed)
  Problem: Education: Goal: Knowledge of General Education information will improve Description Including pain rating scale, medication(s)/side effects and non-pharmacologic comfort measures Outcome: Progressing   Problem: Health Behavior/Discharge Planning: Goal: Ability to manage health-related needs will improve Outcome: Progressing   

## 2021-08-01 NOTE — Discharge Summary (Signed)
?Physician Discharge Summary ?  ?Patient: [name] MRN: [id number] DOB: 1945-06-18  ?Admit date:     07/18/2021  ?Discharge date: 08/01/21  ?Discharge Physician: Hosie Poisson  ? ?PCP: [name], MD  ? ?Recommendations at discharge:  ?Please follow up with IR and ID as scheduled.  ?Please follow up with PCP in one week.  ?Please follow up with oncology as scheduled.  ? ? ?Discharge Diagnoses: ?Principal Problem: ?  Acute cholecystitis ?Active Problems: ?  Diet-controlled diabetes mellitus ([place]) ?  Acquired hypothyroidism ?  GAD (generalized anxiety disorder) ?  Acute metabolic encephalopathy ?  Hyponatremia ?  Cholecystitis ?  Sepsis ([place]) ?  Goals of care, counseling/discussion ? ? ? ?Hospital Course: ? ?[name] is a 76 year old female with past medical history significant for pancreatic cancer on chemotherapy, type 2 diabetes mellitus, hypothyroidism, GERD who presented to [facility] ED on 5/3 via EMS with confusion and fever.  Patient also has been complaining of generalized abdominal pain with intermittent nausea.  ?  ?In the ED, sodium 129, potassium 3.6, chloride 102, CO2 16, glucose 191, BUN 11, creatinine 0.77.  Lactic acid 1.6.  WBC 7.7, hemoglobin 10.9.  TSH 2.251.  CT abdomen/pelvis with diffuse gallbladder wall thickening and edema with apparent area of discontinuity posterior aspect of the distal gallbladder wall with concern for gallbladder rupture or perforation, small ascites, sigmoid diverticulosis, ill-defined known pancreatic mass encasing the portal vein and portal splenic confluence with collateralization, several small bilateral pulmonary nodules, hypodense lesion inferior right lobe.  General surgery was consulted.  Hospital service consulted for further evaluation and management of altered mental status and concern for acute cholecystitis. ?  ?Assessment and Plan: ?*  ?Acute metabolic encephalopathy ?Etiology likely secondary to severe sepsis with bacteremia/UTI and acute  cholecystitis.  Now resolved and appears to be at baseline. ?--Supportive care and treatment as below ?  ?Severe sepsis, POA ?Morganella morganii/E. coli bacteremia ?E. coli/Enterococcus faecium UTI ?Acute cholecystitis s/p cholecystostomy ?Patient presenting to ED with confusion and found to have concerns for acute cholecystitis on CT abdomen/pelvis.  Initially started on empiric antibiotics with vancomycin, metronidazole, cefepime/aztreonam.  Evaluated by general surgery with recommendations of IR placement of cholecystostomy.  Underwent IR cholecystostomy placement on 07/20/2021 with culture positive for E. coli, Morganella morganii, Bacteroides ovatus. Repeat CT abdomen/pelvis notable for new subcapsular fluid collection near the liver spanning 17 cm. ?--Infectious disease following, appreciate assistance ?--WBC 7.7>>24.7>20.2>21.5>17.2>17.5>20.7>20.4>15.7>12.5 to 9.4 today.  ?--Drain culture 5/10: No organisms on Gram stain, no growth x 5 days ?--RUQ drain and JP drain in place.  ?--Cefepime 2 g IV every 12 hours ?--Metronidazole 500 mg PO q12h ?--CBC daily ?--Continue monitor drain output ?--ID recommending 2 weeks cefepime/metronidazole from 5/10 last date on 5/24 ?IR recommends Continue TID flushes with 5 cc NS. ?Record output Q shift. ?Dressing changes QD or PRN if soiled.  ?Repeat imaging/possible drain injection once output < 10 mL/QD (excluding flush material.) ?  ?  ?Left lower lobe pulmonary embolism ?CTA chest 5/9 with bilateral segmental and subsegmental pulmonary emboli.  Vascular duplex ultrasound bilateral lower extremities 5/9 with findings consistent with acute DVT right common femoral vein, acute DVT left common femoral vein and left proximal profunda vein; common femoral vein obstruction does not appear to extend above inguinal ligament. ?--Transition heparin drip to Eliquis t ?  ?Right pleural effusion ?Underwent IR thoracentesis on 07/24/2021 with removal of 350 mL clear yellow fluid.  Positive  lights criteria consistent with exudative pleural effusion.  Pleural fluid cytology negative for malignant cells.  Repeat chest x-ray 5/12 shows improvement in the right-sided pleural effusion with decreased atelectasis. ?--Pleural fluid culture: No growth x 3 days ?  ?Type 2 diabetes mellitus ?Hemoglobin A1c 6.3 on 07/10/2021, well controlled. ? ?  ?Pancreatic cancer ?Follows with medical oncology outpatient, Dr. Benay Spice. ?  ?Hypothyroidism ?--Levothyroxine 100 mcg p.o. daily ?  ?Generalized anxiety disorder ?--Alprazolam daily PRN ?  ?Essential hypertension ?--Amlodipine 5 mg p.o. daily ?  ?Hypomagnesemia ?Repleted.   ?  ?Hypokalemia ?Repleted.   ?  ?  ?Hx COVID-19 viral infection 06/20/2021 ?Oxygenating well on room air. ?  ?Ethics: ?Given patient's multiple comorbidities, debility and clinical decline, palliative care was consulted for assistance with goals of care and medical decision making.  Seen by palliative care on 5/9 and recommendations are to continue with full code/full scope of treatment.  Outpatient palliative care to follow-up at cancer center arranged following discharge. ?  ?Physical deconditioning: ?--Continue PT/OT efforts while inpatient ?-- Accepted at [facility] available.  ?  ? ? ?  ? ? ?Consultants: oncology ?Palliative care ?IR ?ID ?Procedures performed: JP drain ?Ruq drain.   ?Disposition: Skilled nursing facility ?Diet recommendation:  ?Discharge Diet Orders (From admission, onward)  ? ?  Start     Ordered  ? 08/01/21 0000  Diet - low sodium heart healthy       ? 08/01/21 1255  ? ?  ?  ? ?  ? ?Regular diet ?DISCHARGE MEDICATION: ?Allergies as of 08/01/2021   ? ?   Reactions  ? Penicillins Hives, Other (See Comments)  ? Tolerated Ceftriaxone 2023 ?Has patient had a PCN reaction causing immediate rash, facial/tongue/throat swelling, SOB or lightheadedness with hypotension: No ?Has patient had a PCN reaction causing severe rash involving mucus membranes or skin necrosis: No ?Has patient  had a PCN reaction that required hospitalization No ?Has patient had a PCN reaction occurring within the last 10 years: No ?If all of the above answers are "NO", then may proceed with Cephalosporin use.  ? ?  ? ?  ?Medication List  ?  ? ?STOP taking these medications   ? ?albuterol 108 (90 Base) MCG/ACT inhaler ?Commonly known as: VENTOLIN HFA ?  ?ascorbic acid 500 MG tablet ?Commonly known as: VITAMIN C ?  ?aspirin EC 81 MG tablet ?  ?guaiFENesin-dextromethorphan 100-10 MG/5ML syrup ?Commonly known as: ROBITUSSIN DM ?  ?ondansetron 8 MG tablet ?Commonly known as: Zofran ?  ?zinc sulfate 220 (50 Zn) MG capsule ?  ? ?  ? ?TAKE these medications   ? ?Accu-Chek Aviva Plus test strip ?Generic drug: glucose blood ?As needed ?  ?Accu-Chek Guide w/Device Kit ?As needed ?  ?Accu-Chek Softclix Lancets lancets ?As needed ?  ?Alcohol Swabs Pads ?USE AS DIRECTED ?  ?ALPRAZolam 0.5 MG tablet ?Commonly known as: Duanne Moron ?Take 1 tablet (0.5 mg total) by mouth daily as needed for anxiety. ?  ?amLODipine 5 MG tablet ?Commonly known as: NORVASC ?Take 5 mg by mouth daily. ?  ?apixaban 5 MG Tabs tablet ?Commonly known as: ELIQUIS ?Take 2 tablets (10 mg total) by mouth 2 (two) times daily for 1 day. ?  ?apixaban 5 MG Tabs tablet ?Commonly known as: ELIQUIS ?Take 1 tablet (5 mg total) by mouth 2 (two) times daily. ?Start taking on: Aug 02, 2021 ?  ?ceFEPime  IVPB ?Commonly known as: MAXIPIME ?Inject 2 g into the vein every 12 (twelve) hours for 9 days. Indication:  bacteremia and gallbladder abscess ?First  Dose: Yes ?Last Day of Therapy:  08/08/2021 ?Labs - Once weekly:  CBC/D and BMP, ?Labs - Every other week:  ESR and CRP ?Method of administration: IV Push ?Method of administration may be changed at the discretion of home infusion pharmacist based upon assessment of the patient and/or caregiver's ability to self-administer the medication ordered. ?  ?cyanocobalamin 1000 MCG/ML injection ?Commonly known as: (VITAMIN B-12) ?Inject 1 mL  (1,000 mcg total) into the muscle every 30 (thirty) days. ?  ?docusate sodium 100 MG capsule ?Commonly known as: COLACE ?Take 100 mg by mouth 2 (two) times daily as needed (constipation). ?  ?famotidine 20 MG t

## 2021-08-01 NOTE — TOC Transition Note (Signed)
Transition of Care (TOC) - CM/SW Discharge Note ? ? ?Patient Details  ?Name: [name] ?MRN: [id number] ?Date of Birth: 10/28/1945 ? ?Transition of Care (TOC) CM/SW Contact:  ?Emeterio Reeve, LCSW ?Phone Number: ?08/01/2021, 11:39 AM ? ? ?Clinical Narrative:    ? ?Per MD patient ready for DC to [facility]. RN, patient, patient's family, and facility notified of DC. Discharge Summary and FL2 sent to facility. DC packet on chart. Insurance Josem Kaufmann has been received.  Ambulance transport requested for patient.  ?  ?RN to call report to [phone]. ? ?CSW will sign off for now as social work intervention is no longer needed. Please consult [place] again if new needs arise. ? ? ?Final next level of care: [place] ?Barriers to Discharge: Continued Medical Work up ? ? ?Patient Goals and CMS Choice ?Patient states their goals for this hospitalization and ongoing recovery are:: rehab ?CMS Medicare.gov Compare Post Acute Care list provided to:: Patient ?Choice offered to / list presented to : Patient ? ?Discharge Placement ?  ?           ?Patient chooses bed at: [facility] ?Patient to be transferred to facility by: ptar ?Name of family member notified: Daughter, [name] ?Patient and family notified of of transfer: 08/01/21 ? ?Discharge Plan and Services ?  ?  ?           ?  ?  ?  ?  ?  ?  ?  ?  ?  ?  ? ?Social Determinants of Health (SDOH) Interventions ?  ? ? ?Readmission Risk Interventions ?   ? View : No data to display.  ?  ?  ?  ? ? ?Emeterio Reeve, LCSW ?Clinical Social Worker ? ? ? ?

## 2021-08-01 NOTE — Progress Notes (Signed)
? ? ? ?Supervising Physician: Juliet Rude ? ?Patient Status:  [facility] - In-pt ? ?Chief Complaint: ? ?Acute cholecystitis perc chole drain 5/5 ?Intra-abdominal fluid collection, drain placed 07/25/21 ? ?Subjective: ?Feels okay. ?Some abd discomfort ? ?Allergies: ?Penicillins ? ?Medications: ? ?Current Facility-Administered Medications:  ?  acetaminophen (TYLENOL) tablet 650 mg, 650 mg, Oral, Q6H PRN **OR** acetaminophen (TYLENOL) suppository 650 mg, 650 mg, Rectal, Q6H PRN, Hosie Poisson, MD ?  ALPRAZolam Duanne Moron) tablet 0.25 mg, 0.25 mg, Oral, Daily PRN, Hosie Poisson, MD, 0.25 mg at 08/01/21 0154 ?  alum & mag hydroxide-simeth (MAALOX/MYLANTA) 200-200-20 MG/5ML suspension 15 mL, 15 mL, Oral, Q6H PRN, [place] Donnamarie Poag, DO ?  amLODipine (NORVASC) tablet 5 mg, 5 mg, Oral, Daily, Hosie Poisson, MD, 5 mg at 08/01/21 0950 ?  apixaban (ELIQUIS) tablet 10 mg, 10 mg, Oral, BID, Donnamae Jude, RPH, 10 mg at 08/01/21 0950 ?  [START ON 08/02/2021] apixaban (ELIQUIS) tablet 5 mg, 5 mg, Oral, BID, Donnamae Jude, RPH ?  ceFEPIme (MAXIPIME) 2 g in sodium chloride 0.9 % 100 mL IVPB, 2 g, Intravenous, Q12H, Akula, Vijaya, MD, Last Rate: 200 mL/hr at 08/01/21 0930, 2 g at 08/01/21 0930 ?  Chlorhexidine Gluconate Cloth 2 % PADS 6 each, 6 each, Topical, Daily, Hosie Poisson, MD, 6 each at 08/01/21 0950 ?  famotidine (PEPCID) tablet 20 mg, 20 mg, Oral, Daily, [place] Donnamarie Poag, DO, 20 mg at 08/01/21 1194 ?  hydrALAZINE (APRESOLINE) tablet 25 mg, 25 mg, Oral, Q8H PRN, Hosie Poisson, MD ?  HYDROmorphone (DILAUDID) injection 0.5 mg, 0.5 mg, Intravenous, Q2H PRN, Hosie Poisson, MD, 0.5 mg at 08/01/21 1019 ?  insulin aspart (novoLOG) injection 0-6 Units, 0-6 Units, Subcutaneous, Q6H, Hosie Poisson, MD, 1 Units at 07/31/21 1811 ?  iohexol (OMNIPAQUE) 300 MG/ML solution 100 mL, 100 mL, Per Tube, Once PRN, Hosie Poisson, MD ?  levothyroxine (SYNTHROID) tablet 125 mcg, 125 mcg, Oral, QAC breakfast, Hosie Poisson, MD, 125 mcg at 08/01/21 0543 ?   lidocaine-prilocaine (EMLA) cream, , Topical, PRN, Hosie Poisson, MD, Given at 07/23/21 1115 ?  loperamide (IMODIUM) capsule 2 mg, 2 mg, Oral, PRN, [place] Eric J, DO ?  metroNIDAZOLE (FLAGYL) tablet 500 mg, 500 mg, Oral, Q12H, Manandhar, Sabina, MD, 500 mg at 08/01/21 0423 ?  multivitamin with minerals tablet 1 tablet, 1 tablet, Oral, Daily, Hosie Poisson, MD ?  naloxone (NARCAN) injection 0.4 mg, 0.4 mg, Intravenous, PRN, Hosie Poisson, MD ?  pantoprazole (PROTONIX) EC tablet 40 mg, 40 mg, Oral, Daily, [place] Donnamarie Poag, DO, 40 mg at 08/01/21 0950 ?  polyethylene glycol (MIRALAX / GLYCOLAX) packet 17 g, 17 g, Oral, Daily, Hosie Poisson, MD, 17 g at 07/25/21 1038 ?  prochlorperazine (COMPAZINE) injection 5 mg, 5 mg, Intravenous, Q6H PRN, Hosie Poisson, MD, 5 mg at 07/31/21 0023 ?  prochlorperazine (COMPAZINE) tablet 5 mg, 5 mg, Oral, Q6H PRN, Hosie Poisson, MD ?  senna-docusate (Senokot-S) tablet 2 tablet, 2 tablet, Oral, BID, Hosie Poisson, MD, 2 tablet at 07/29/21 2021 ?  sodium chloride flush (NS) 0.9 % injection 10-40 mL, 10-40 mL, Intracatheter, Q12H, Hosie Poisson, MD, 10 mL at 08/01/21 0950 ?  sodium chloride flush (NS) 0.9 % injection 10-40 mL, 10-40 mL, Intracatheter, PRN, Hosie Poisson, MD, 10 mL at 07/29/21 0022 ?  sodium chloride flush (NS) 0.9 % injection 10-40 mL, 10-40 mL, Intracatheter, Q12H, Hosie Poisson, MD, 10 mL at 08/01/21 0950 ?  sodium chloride flush (NS) 0.9 % injection 10-40 mL, 10-40 mL, Intracatheter, PRN,  Hosie Poisson, MD, 10 mL at 07/29/21 0023 ?  sodium chloride flush (NS) 0.9 % injection 5 mL, 5 mL, Intracatheter, Q8H, Hosie Poisson, MD, 5 mL at 08/01/21 0547 ?  sodium chloride flush (NS) 0.9 % injection 5 mL, 5 mL, Intracatheter, Q8H, Aletta Edouard, MD, 5 mL at 08/01/21 0547 ?  traMADol (ULTRAM) tablet 50 mg, 50 mg, Oral, Q6H PRN, Hosie Poisson, MD, 50 mg at 07/25/21 1038 ? ? ? ?Vital Signs: ?BP 126/74 (BP Location: Right Arm)   Pulse 81   Temp (!) 97.5 ?F (36.4 ?C) (Oral)    Resp 16   Ht '5\' 3"'$  (1.6 m)   Wt 74.5 kg   SpO2 98%   BMI 29.09 kg/m?  ? ?Physical Exam ?Vitals reviewed.  ?Constitutional:   ?   Appearance: She is ill-appearing.  ?HENT:  ?   Head: Normocephalic and atraumatic.  ?   Mouth/Throat:  ?   Pharynx: Oropharynx is clear.  ?Eyes:  ?   Extraocular Movements: Extraocular movements intact.  ?Cardiovascular:  ?   Rate and Rhythm: Normal rate.  ?   Pulses: Normal pulses.  ?Pulmonary:  ?   Effort: Pulmonary effort is normal.  ?Abdominal:  ?   General: There is no distension.  ?   Palpations: Abdomen is soft.  ?   Tenderness: There is no abdominal tenderness.  ?Skin: ?   General: Skin is dry.  ?Neurological:  ?   General: No focal deficit present.  ?   Mental Status: She is alert and oriented to person, place, and time.  ?Psychiatric:     ?   Mood and Affect: Mood normal.     ?   Thought Content: Thought content normal.  ? ?24 hour output:  ?Output by Drain (mL) 07/30/21 0701 - 07/30/21 1900 07/30/21 1901 - 07/31/21 0700 07/31/21 0701 - 07/31/21 1900 07/31/21 1901 - 08/01/21 0700 08/01/21 0701 - 08/01/21 1152  ?Closed System Drain RUQ 10.2 Fr.  60 30 10 0  ?Closed System Drain 1 Right RUQ Bulb (JP) 12 Fr. 37 40 40 40 0  ? ? ?Current examination: ?Flushes/aspirates easily.  ?Insertion site unremarkable. ?Suture and stat lock in place. ?Dressed appropriately.  ?RUQ 10 Fr perc chole drain intact, site clean. OP  ?RUQ 12 Fr drain intact, site clean.  OP yellow with some debris ? ?Imaging: ?No results found. ? ?Labs: ? ?CBC: ?Recent Labs  ?  07/29/21 ?9509 07/30/21 ?3267 07/31/21 ?1245 08/01/21 ?0209  ?WBC 20.1* 15.7* 12.5* 9.4  ?HGB 10.2* 10.0* 9.8* 9.6*  ?HCT 31.0* 31.5* 30.4* 30.5*  ?PLT 173 171 152 153  ? ? ? ?COAGS: ?Recent Labs  ?  10/14/20 ?1457 06/30/21 ?8099 07/18/21 ?1503 07/19/21 ?8338 07/24/21 ?1306  ?INR 1.1 1.1 1.2 1.3* 1.2  ?APTT 45*  --  28  --   --   ? ? ? ?BMP: ?Recent Labs  ?  07/29/21 ?2505 07/30/21 ?3976 07/31/21 ?7341 08/01/21 ?0209  ?NA 132* 134* 132* 134*   ?K 3.4* 3.7 3.5 3.4*  ?CL 105 107 108 110  ?CO2 20* 19* 19* 21*  ?GLUCOSE 121* 105* 123* 126*  ?BUN '8 8 8 '$ 7*  ?CALCIUM 7.3* 7.5* 7.4* 7.4*  ?CREATININE 0.60 0.58 0.60 0.61  ?GFRNONAA >60 >60 >60 >60  ? ? ? ?LIVER FUNCTION TESTS: ?Recent Labs  ?  07/24/21 ?0433 07/25/21 ?0215 07/26/21 ?0322 07/27/21 ?0329  ?BILITOT 0.9 0.9 0.8 0.3  ?AST '21 26 28 25  '$ ?ALT '13 14 12 13  '$ ?  ALKPHOS 183* 204* 188* 171*  ?PROT 4.9* 5.0* 4.7* 4.7*  ?ALBUMIN 1.7* 1.7* 1.6* 1.6*  ? ? ? ?Assessment and Plan: ? ?S/p perc chole (07/20/21) and peri-hepatic drain  (07/25/21) placement ? ?Continue TID flushes with 5 cc NS. ?Record output Q shift. ?Dressing changes QD or PRN if soiled.  ?Call IR APP or on call IR MD if difficulty flushing or sudden change in drain output.  ?Repeat imaging/possible drain injection once output < 10 mL/QD (excluding flush material.) ? ?Discharge planning: ?Please contact IR APP or on call IR MD prior to patient d/c to ensure appropriate follow up plans are in place. Typically patient will follow up with IR clinic 10-14 days post d/c for repeat imaging/possible drain injection. IR scheduler will contact patient with date/time of appointment. Patient will need to flush drain QD with 5 cc NS, record output QD, dressing changes every 2-3 days or earlier if soiled.  ? ?IR will continue to follow - please call with questions or concerns. ? ?Electronically Signed: ?Ascencion Dike, PA-C ?08/01/2021, 11:52 AM ? ? ?I spent a total of 25 Minutes at the the patient's bedside AND on the patient's hospital floor or unit, greater than 50% of which was counseling/coordinating care for drain management ? ? ? ? ? ?

## 2021-08-01 NOTE — Progress Notes (Signed)
[facility] at bedside for transport. AVS added to DC packet. Report given to RN at [facility]. Pt transported off unit via gurney with belongings (including cell phone and charger).  ? ?Spoke to pt's daughter who plans to meet pt at [facility]. Pt remains a&o and stable at baseline. ?

## 2021-08-01 NOTE — Progress Notes (Addendum)
Attempted to call [facility]  [phone]) to give report at this time. Left my name and call back number as SNF RN was unavailable at time of call. ?Pick up time for transport via [facility] unknown. ?IV team consult to deaccess R chest port. ?

## 2021-08-02 ENCOUNTER — Other Ambulatory Visit (HOSPITAL_COMMUNITY): Payer: Self-pay

## 2021-08-02 DIAGNOSIS — F39 Unspecified mood [affective] disorder: Secondary | ICD-10-CM | POA: Diagnosis not present

## 2021-08-02 DIAGNOSIS — I2699 Other pulmonary embolism without acute cor pulmonale: Secondary | ICD-10-CM | POA: Diagnosis not present

## 2021-08-02 DIAGNOSIS — R2681 Unsteadiness on feet: Secondary | ICD-10-CM | POA: Diagnosis not present

## 2021-08-02 DIAGNOSIS — R6520 Severe sepsis without septic shock: Secondary | ICD-10-CM | POA: Diagnosis not present

## 2021-08-02 DIAGNOSIS — G9341 Metabolic encephalopathy: Secondary | ICD-10-CM | POA: Diagnosis not present

## 2021-08-02 DIAGNOSIS — K819 Cholecystitis, unspecified: Secondary | ICD-10-CM | POA: Diagnosis not present

## 2021-08-02 DIAGNOSIS — R531 Weakness: Secondary | ICD-10-CM | POA: Diagnosis not present

## 2021-08-02 DIAGNOSIS — M6250 Muscle wasting and atrophy, not elsewhere classified, unspecified site: Secondary | ICD-10-CM | POA: Diagnosis not present

## 2021-08-02 DIAGNOSIS — G250 Essential tremor: Secondary | ICD-10-CM | POA: Diagnosis not present

## 2021-08-02 DIAGNOSIS — C259 Malignant neoplasm of pancreas, unspecified: Secondary | ICD-10-CM | POA: Diagnosis not present

## 2021-08-02 DIAGNOSIS — M159 Polyosteoarthritis, unspecified: Secondary | ICD-10-CM | POA: Diagnosis not present

## 2021-08-02 DIAGNOSIS — A419 Sepsis, unspecified organism: Secondary | ICD-10-CM | POA: Diagnosis not present

## 2021-08-02 DIAGNOSIS — I82401 Acute embolism and thrombosis of unspecified deep veins of right lower extremity: Secondary | ICD-10-CM | POA: Diagnosis not present

## 2021-08-02 DIAGNOSIS — E039 Hypothyroidism, unspecified: Secondary | ICD-10-CM | POA: Diagnosis not present

## 2021-08-02 DIAGNOSIS — E8809 Other disorders of plasma-protein metabolism, not elsewhere classified: Secondary | ICD-10-CM | POA: Diagnosis not present

## 2021-08-02 DIAGNOSIS — F411 Generalized anxiety disorder: Secondary | ICD-10-CM | POA: Diagnosis not present

## 2021-08-03 ENCOUNTER — Other Ambulatory Visit: Payer: Self-pay | Admitting: Surgery

## 2021-08-03 DIAGNOSIS — K819 Cholecystitis, unspecified: Secondary | ICD-10-CM

## 2021-08-06 ENCOUNTER — Other Ambulatory Visit: Payer: Self-pay

## 2021-08-06 ENCOUNTER — Ambulatory Visit (INDEPENDENT_AMBULATORY_CARE_PROVIDER_SITE_OTHER): Payer: Medicare HMO | Admitting: Infectious Diseases

## 2021-08-06 ENCOUNTER — Encounter: Payer: Self-pay | Admitting: Infectious Diseases

## 2021-08-06 ENCOUNTER — Telehealth: Payer: Self-pay

## 2021-08-06 VITALS — BP 128/86 | HR 112 | Temp 98.0°F

## 2021-08-06 DIAGNOSIS — M159 Polyosteoarthritis, unspecified: Secondary | ICD-10-CM | POA: Diagnosis not present

## 2021-08-06 DIAGNOSIS — Z452 Encounter for adjustment and management of vascular access device: Secondary | ICD-10-CM | POA: Diagnosis not present

## 2021-08-06 DIAGNOSIS — Z5181 Encounter for therapeutic drug level monitoring: Secondary | ICD-10-CM | POA: Insufficient documentation

## 2021-08-06 DIAGNOSIS — F411 Generalized anxiety disorder: Secondary | ICD-10-CM | POA: Diagnosis not present

## 2021-08-06 DIAGNOSIS — R531 Weakness: Secondary | ICD-10-CM | POA: Diagnosis not present

## 2021-08-06 DIAGNOSIS — A419 Sepsis, unspecified organism: Secondary | ICD-10-CM | POA: Diagnosis not present

## 2021-08-06 DIAGNOSIS — R2681 Unsteadiness on feet: Secondary | ICD-10-CM | POA: Diagnosis not present

## 2021-08-06 DIAGNOSIS — G9341 Metabolic encephalopathy: Secondary | ICD-10-CM | POA: Diagnosis not present

## 2021-08-06 DIAGNOSIS — Z95828 Presence of other vascular implants and grafts: Secondary | ICD-10-CM | POA: Diagnosis not present

## 2021-08-06 DIAGNOSIS — C259 Malignant neoplasm of pancreas, unspecified: Secondary | ICD-10-CM | POA: Diagnosis not present

## 2021-08-06 DIAGNOSIS — M6250 Muscle wasting and atrophy, not elsewhere classified, unspecified site: Secondary | ICD-10-CM | POA: Diagnosis not present

## 2021-08-06 DIAGNOSIS — K650 Generalized (acute) peritonitis: Secondary | ICD-10-CM | POA: Diagnosis not present

## 2021-08-06 DIAGNOSIS — G250 Essential tremor: Secondary | ICD-10-CM | POA: Diagnosis not present

## 2021-08-06 NOTE — Progress Notes (Unsigned)
Labs drawn via PICC line per Dr. Manandhar. Line flushed with 10 mL normal saline and clamped. Patient tolerated procedure well.   Jhordan Kinter D Shaylon Aden, RN  

## 2021-08-06 NOTE — Progress Notes (Incomplete)
Patient Active Problem List   Diagnosis Date Noted   Sepsis ([place])    Goals of care, counseling/discussion    Cholecystitis 07/19/2021   Acute cholecystitis 94/09/6806   Acute metabolic encephalopathy 81/12/3157   Hyponatremia 07/18/2021   Elevated liver enzymes 06/30/2021   Pancreatic adenocarcinoma ([place]) 09/25/2020   Hyperbilirubinemia 09/24/2020   Primary pancreatic cancer with metastasis to other site ([place]) 09/12/2020   Vitamin B12 deficiency 10/20/2019   GAD (generalized anxiety disorder) 10/20/2019   Diet-controlled diabetes mellitus ([place]) 10/20/2019   Osteoarthritis, multiple sites 10/20/2019   Presbycusis of both ears 04/13/2019   Essential tremor 04/13/2019   Bilateral primary osteoarthritis of knee 09/23/2017   Acquired hypothyroidism 05/24/2012   Alcohol abuse, daily use 05/24/2012   Essential hypertension    Current Outpatient Medications on File Prior to Visit  Medication Sig Dispense Refill   ACCU-CHEK AVIVA PLUS test strip As needed 100 each 3   Accu-Chek Softclix Lancets lancets As needed 100 each 3   Alcohol Swabs PADS USE AS DIRECTED 100 each 4   ALPRAZolam (XANAX) 0.5 MG tablet Take 1 tablet (0.5 mg total) by mouth daily as needed for anxiety. 2 tablet 0   apixaban (ELIQUIS) 5 MG TABS tablet Take 2 tablets (10 mg total) by mouth 2 (two) times daily for 1 day. then Take 1 tablet (5 mg total) by mouth 2 (two) times daily. 64 tablet 0   Blood Glucose Monitoring Suppl (ACCU-CHEK GUIDE) w/Device KIT As needed 1 kit 0   ceFEPime (MAXIPIME) IVPB Inject 2 g into the vein every 12 (twelve) hours for 9 days. Indication:  bacteremia and gallbladder abscess First Dose: Yes Last Day of Therapy:  08/08/2021 Labs - Once weekly:  CBC/D and BMP, Labs - Every other week:  ESR and CRP Method of administration: IV Push Method of administration may be changed at the discretion of home infusion pharmacist based upon assessment of the patient and/or caregiver's ability to  self-administer the medication ordered. 18 Units 0   cyanocobalamin (,VITAMIN B-12,) 1000 MCG/ML injection Inject 1 mL (1,000 mcg total) into the muscle every 30 (thirty) days.     docusate sodium (COLACE) 100 MG capsule Take 100 mg by mouth 2 (two) times daily as needed (constipation).     famotidine (PEPCID) 20 MG tablet Take 1 tablet (20 mg total) by mouth daily. 30 tablet 0   levothyroxine (SYNTHROID) 125 MCG tablet TAKE 1 TABLET ON AN EMPTY STOMACH IN THE MORNING (Patient taking differently: Take 125 mcg by mouth daily before breakfast.) 90 tablet 1   lidocaine-prilocaine (EMLA) cream APPLY 1 APPLICATION TOPICALLY AS NEEDED. (Patient taking differently: Apply 1 application. topically daily as needed (numbing).) 30 g 2   metroNIDAZOLE (FLAGYL) 500 MG tablet Take 1 tablet (500 mg total) by mouth every 12 (twelve) hours for 7 days. 14 tablet 0   Multiple Vitamin (MULTIVITAMIN WITH MINERALS) TABS tablet Take 1 tablet by mouth daily.     omeprazole (PRILOSEC) 20 MG capsule TAKE 1 CAPSULE BY MOUTH EVERY DAY (Patient taking differently: Take 20 mg by mouth daily.) 90 capsule 1   polyethylene glycol powder (GLYCOLAX/MIRALAX) 17 GM/SCOOP powder Take 17 g by mouth daily. 238 g 0   prochlorperazine (COMPAZINE) 10 MG tablet Take 1 tablet (10 mg total) by mouth every 6 (six) hours as needed (Nausea or vomiting). 30 tablet 1   traMADol (ULTRAM) 50 MG tablet Take 1 tablet (50 mg total) by mouth every 6 (six) hours as  needed for moderate pain or severe pain. 12 tablet 0   apixaban (ELIQUIS) 5 MG TABS tablet Take 2 tablets (10 mg total) by mouth 2 (two) times daily for 1 day. 2 tablet 0   No current facility-administered medications on file prior to visit.   Subjective: After last seein 5/15, patient got discharged on 5/17 on IV cefepime and po metronidazole. However, patient also had a left arm PICC line placed as she tells me the staff at SNF were not comfortable using port for IV abtx. She is accompanied by  her daughter. I looked at her [facility] from SNF. She is getting IV cefepime through Left arm PICC + PO metronidazole. Denies nausea, vomiting, abdominal pain and diarrhea. No issues with port/PICC. She still has 2 drain in place ( 1 with 0 fluid in the bulb and another with minimal murky fluid). Daughter tells me she feels her mother weaker in the last 4-5 days which she does not think is her usual self. Discussed about going to the hospital to which patient said she would like to go back and rest.   Review of Systems: ROS all systems reviewed with pertinent positives and negatives as listed above  Past Medical History:  Diagnosis Date   Anxiety    Arthritis    Cancer ([place])    pancreatic   Depression    Diabetes mellitus without complication ([place])    Hyperlipemia    Hypertension    Thyroid disease    TIA (transient ischemic attack)    Vitamin B12 deficiency 10/20/2019   Past Surgical History:  Procedure Laterality Date   ABDOMINAL HYSTERECTOMY     BILIARY STENT PLACEMENT N/A 09/28/2020   Procedure: BILIARY STENT PLACEMENT;  Surgeon: Clarene Essex, MD;  Location: [facility];  Service: Endoscopy;  Laterality: N/A;   BREAST BIOPSY     ERCP N/A 09/28/2020   Procedure: ENDOSCOPIC RETROGRADE CHOLANGIOPANCREATOGRAPHY (ERCP);  Surgeon: Clarene Essex, MD;  Location: [facility];  Service: Endoscopy;  Laterality: N/A;   IR IMAGING GUIDED PORT INSERTION  09/22/2020   IR PERC CHOLECYSTOSTOMY  07/20/2021   IR THORACENTESIS ASP PLEURAL SPACE W/IMG GUIDE  07/24/2021   SPHINCTEROTOMY  09/28/2020   Procedure: SPHINCTEROTOMY;  Surgeon: Clarene Essex, MD;  Location: [facility];  Service: Endoscopy;;   TUBAL LIGATION      Social History   Tobacco Use   Smoking status: Former    Packs/day: 1.00    Years: 10.00    Pack years: 10.00    Types: Cigarettes    Quit date: 05/25/1987    Years since quitting: 34.2   Smokeless tobacco: Never  Vaping Use   Vaping Use: Never used  Substance Use Topics   Alcohol use:  Yes    Alcohol/week: 21.0 standard drinks    Types: 21 Glasses of wine per week    Comment: 3 glasses of wine daily, she stopped in 08/2020   Drug use: No    Family History  Problem Relation Age of Onset   Diabetes Mother    Alzheimer's disease Mother    Dementia Mother    COPD Father    Emphysema Father    Heart disease Sister    Alcohol abuse Brother    Heart disease Brother    Emphysema Brother    Heart attack Daughter    Cancer Other        breast cancer    Allergies  Allergen Reactions   Penicillins Hives and Other (See Comments)  Tolerated Ceftriaxone 2023  Has patient had a PCN reaction causing immediate rash, facial/tongue/throat swelling, SOB or lightheadedness with hypotension: No Has patient had a PCN reaction causing severe rash involving mucus membranes or skin necrosis: No Has patient had a PCN reaction that required hospitalization No Has patient had a PCN reaction occurring within the last 10 years: No If all of the above answers are "NO", then may proceed with Cephalosporin use.    Health Maintenance  Topic Date Due   COVID-19 Vaccine (3 - Moderna risk series) 07/18/2020   FOOT EXAM  07/26/2021   INFLUENZA VACCINE  10/16/2021   HEMOGLOBIN A1C  01/09/2022   MAMMOGRAM  05/24/2022   OPHTHALMOLOGY EXAM  06/17/2022   TETANUS/TDAP  12/21/2023   Pneumonia Vaccine 25+ Years old  Completed   DEXA SCAN  Completed   Hepatitis C Screening  Completed   HPV VACCINES  Aged Out   Zoster Vaccines- Shingrix  Discontinued    Objective: BP 128/86   Pulse (!) 112   Temp 98 F (36.7 C) (Temporal)    Physical Exam Constitutional:      Appearance: Elderly female sitting in the wheelchair, tremors noted ( she tells me she has h/o tremors) HENT:     Head: Normocephalic and atraumatic.      Mouth: Mucous membranes are moist.  Eyes:    Conjunctiva/sclera: Conjunctivae normal.     Pupils:   Cardiovascular:     Rate and Rhythm: Normal rate and regular rhythm.      Heart sounds:   Pulmonary:     Effort: Pulmonary effort is normal.     Breath sounds: Normal breath sounds.   Abdominal:     General: Non distended     Palpations: soft. 2 drains in the rt side of abdomen ( 75m fluid in 1 bulb and minimal murky fluid in the other bulb)  Skin:    General: Skin is warm and dry.     Comments:  Neurological:     General: grossly non focal     Mental Status: awake, alert and oriented to person, place, and time.   Psychiatric:        Mood and Affect: Mood normal.   Lab Results Lab Results  Component Value Date   WBC 9.4 08/01/2021   HGB 9.6 (L) 08/01/2021   HCT 30.5 (L) 08/01/2021   MCV 96.8 08/01/2021   PLT 153 08/01/2021    Lab Results  Component Value Date   CREATININE 0.61 08/01/2021   BUN 7 (L) 08/01/2021   NA 134 (L) 08/01/2021   K 3.4 (L) 08/01/2021   CL 110 08/01/2021   CO2 21 (L) 08/01/2021    Lab Results  Component Value Date   ALT 13 07/27/2021   AST 25 07/27/2021   ALKPHOS 171 (H) 07/27/2021   BILITOT 0.3 07/27/2021    Lab Results  Component Value Date   CHOL 143 01/11/2021   HDL 51.20 01/11/2021   LDLCALC 73 01/11/2021   TRIG 94.0 01/11/2021   CHOLHDL 3 01/11/2021   No results found for: LABRPR, RPRTITER No results found for: HIV1RNAQUANT, HIV1RNAVL, CD4TABS   Problem List Items Addressed This Visit       Other   Port-A-Cath in place - Primary   Medication monitoring encounter   Relevant Orders   CBC (Completed)   Basic metabolic panel (Completed)   C-reactive protein (Completed)   Sedimentation rate (Completed)   Perihepatic abscess ([place]   PICC (peripherally inserted central  catheter) in place   Assessment/Plan # Subcapsular fluid collection adjacent to the liver.  17.6 cm # Acute cholecystitis S/p cholecystostomy by IR 5/5. Cx with Morganella morganii/B ovatus and E coli  # Morganella morganii/E coli  bacteremia - S/p CT guided drainage of perihepatic fluid 5/10. Cx no growth - Request labs  from SNF - Labs today  - CBC and BMP weekly - Fu with IR as instructed . Repeat CT abd/pelvis on 5/31 - Continue IV cefepime and PO metronidazole until fu visit on 6/5 pending CT abd/pelvis   # Pancreatic ca on chemotherapy  - Oncology following    # DVT of lower extremities and PE  - ON AC  # PICC/Port-a-cath in place - no issues   I have personally spent 45 minutes involved in face-to-face and non-face-to-face activities for this patient on the day of the visit. Professional time spent includes the following activities: Preparing to see the patient (review of tests), Obtaining and/or reviewing separately obtained history (admission/discharge record), Performing a medically appropriate examination and/or evaluation , Ordering medications/tests/procedures, referring and communicating with other health care professionals, Documenting clinical information in the EMR, Independently interpreting results (not separately reported), Communicating results to the patient/family/caregiver, Counseling and educating the patient/family/caregiver and Care coordination (not separately reported).   Wilber Oliphant, [facility] 08/06/2021, 9:49 AM

## 2021-08-06 NOTE — Telephone Encounter (Signed)
Per MD called [facility] ([phone]) to  relay verbal orders for pt to continue IV antibiotics cefepime and metronidazole PO until 08/20/21. Also, obtain weekly labs CBC and BMP to determine duration of IV antibiotics.

## 2021-08-07 ENCOUNTER — Encounter: Payer: Self-pay | Admitting: Oncology

## 2021-08-07 ENCOUNTER — Inpatient Hospital Stay: Payer: Medicare HMO | Admitting: Infectious Diseases

## 2021-08-07 LAB — CBC
HCT: 36.7 % (ref 35.0–45.0)
Hemoglobin: 12 g/dL (ref 11.7–15.5)
MCH: 30.6 pg (ref 27.0–33.0)
MCHC: 32.7 g/dL (ref 32.0–36.0)
MCV: 93.6 fL (ref 80.0–100.0)
MPV: 10.2 fL (ref 7.5–12.5)
Platelets: 193 10*3/uL (ref 140–400)
RBC: 3.92 10*6/uL (ref 3.80–5.10)
RDW: 17.7 % — ABNORMAL HIGH (ref 11.0–15.0)
WBC: 9.3 10*3/uL (ref 3.8–10.8)

## 2021-08-07 LAB — BASIC METABOLIC PANEL
BUN/Creatinine Ratio: 16 (calc) (ref 6–22)
BUN: 8 mg/dL (ref 7–25)
CO2: 22 mmol/L (ref 20–32)
Calcium: 8 mg/dL — ABNORMAL LOW (ref 8.6–10.4)
Chloride: 104 mmol/L (ref 98–110)
Creat: 0.51 mg/dL — ABNORMAL LOW (ref 0.60–1.00)
Glucose, Bld: 153 mg/dL — ABNORMAL HIGH (ref 65–99)
Potassium: 3.8 mmol/L (ref 3.5–5.3)
Sodium: 133 mmol/L — ABNORMAL LOW (ref 135–146)

## 2021-08-07 LAB — C-REACTIVE PROTEIN: CRP: 28.8 mg/L — ABNORMAL HIGH (ref <8.0)

## 2021-08-07 LAB — SEDIMENTATION RATE: Sed Rate: 38 mm/h — ABNORMAL HIGH (ref 0–30)

## 2021-08-08 DIAGNOSIS — M6250 Muscle wasting and atrophy, not elsewhere classified, unspecified site: Secondary | ICD-10-CM | POA: Diagnosis not present

## 2021-08-08 DIAGNOSIS — M159 Polyosteoarthritis, unspecified: Secondary | ICD-10-CM | POA: Diagnosis not present

## 2021-08-08 DIAGNOSIS — C259 Malignant neoplasm of pancreas, unspecified: Secondary | ICD-10-CM | POA: Diagnosis not present

## 2021-08-08 DIAGNOSIS — G250 Essential tremor: Secondary | ICD-10-CM | POA: Diagnosis not present

## 2021-08-08 DIAGNOSIS — I82401 Acute embolism and thrombosis of unspecified deep veins of right lower extremity: Secondary | ICD-10-CM | POA: Diagnosis not present

## 2021-08-08 DIAGNOSIS — G9341 Metabolic encephalopathy: Secondary | ICD-10-CM | POA: Diagnosis not present

## 2021-08-08 DIAGNOSIS — R531 Weakness: Secondary | ICD-10-CM | POA: Diagnosis not present

## 2021-08-08 DIAGNOSIS — R2681 Unsteadiness on feet: Secondary | ICD-10-CM | POA: Diagnosis not present

## 2021-08-08 DIAGNOSIS — K819 Cholecystitis, unspecified: Secondary | ICD-10-CM | POA: Diagnosis not present

## 2021-08-08 DIAGNOSIS — A419 Sepsis, unspecified organism: Secondary | ICD-10-CM | POA: Diagnosis not present

## 2021-08-08 DIAGNOSIS — Z452 Encounter for adjustment and management of vascular access device: Secondary | ICD-10-CM | POA: Insufficient documentation

## 2021-08-08 DIAGNOSIS — I2699 Other pulmonary embolism without acute cor pulmonale: Secondary | ICD-10-CM | POA: Diagnosis not present

## 2021-08-08 DIAGNOSIS — F411 Generalized anxiety disorder: Secondary | ICD-10-CM | POA: Diagnosis not present

## 2021-08-09 ENCOUNTER — Other Ambulatory Visit: Payer: Medicare HMO

## 2021-08-09 ENCOUNTER — Inpatient Hospital Stay: Payer: Medicare HMO | Attending: Hematology

## 2021-08-09 ENCOUNTER — Ambulatory Visit: Payer: Medicare HMO | Admitting: Nurse Practitioner

## 2021-08-09 ENCOUNTER — Other Ambulatory Visit: Payer: Self-pay

## 2021-08-09 ENCOUNTER — Inpatient Hospital Stay: Payer: Medicare HMO | Admitting: Nurse Practitioner

## 2021-08-09 ENCOUNTER — Inpatient Hospital Stay: Payer: Medicare HMO

## 2021-08-09 ENCOUNTER — Inpatient Hospital Stay: Payer: Medicare HMO | Admitting: Oncology

## 2021-08-09 ENCOUNTER — Inpatient Hospital Stay (HOSPITAL_BASED_OUTPATIENT_CLINIC_OR_DEPARTMENT_OTHER): Payer: Medicare HMO | Admitting: Oncology

## 2021-08-09 ENCOUNTER — Ambulatory Visit: Payer: Medicare HMO

## 2021-08-09 VITALS — BP 118/75 | HR 100 | Temp 98.3°F | Resp 20 | Ht 63.0 in | Wt 155.0 lb

## 2021-08-09 DIAGNOSIS — I10 Essential (primary) hypertension: Secondary | ICD-10-CM | POA: Insufficient documentation

## 2021-08-09 DIAGNOSIS — M6250 Muscle wasting and atrophy, not elsewhere classified, unspecified site: Secondary | ICD-10-CM | POA: Diagnosis not present

## 2021-08-09 DIAGNOSIS — Z7901 Long term (current) use of anticoagulants: Secondary | ICD-10-CM | POA: Diagnosis not present

## 2021-08-09 DIAGNOSIS — R2681 Unsteadiness on feet: Secondary | ICD-10-CM | POA: Diagnosis not present

## 2021-08-09 DIAGNOSIS — Z87440 Personal history of urinary (tract) infections: Secondary | ICD-10-CM | POA: Diagnosis not present

## 2021-08-09 DIAGNOSIS — F411 Generalized anxiety disorder: Secondary | ICD-10-CM | POA: Diagnosis not present

## 2021-08-09 DIAGNOSIS — I2694 Multiple subsegmental pulmonary emboli without acute cor pulmonale: Secondary | ICD-10-CM | POA: Insufficient documentation

## 2021-08-09 DIAGNOSIS — C787 Secondary malignant neoplasm of liver and intrahepatic bile duct: Secondary | ICD-10-CM | POA: Diagnosis not present

## 2021-08-09 DIAGNOSIS — C259 Malignant neoplasm of pancreas, unspecified: Secondary | ICD-10-CM

## 2021-08-09 DIAGNOSIS — M159 Polyosteoarthritis, unspecified: Secondary | ICD-10-CM | POA: Diagnosis not present

## 2021-08-09 DIAGNOSIS — C251 Malignant neoplasm of body of pancreas: Secondary | ICD-10-CM | POA: Diagnosis not present

## 2021-08-09 DIAGNOSIS — E1140 Type 2 diabetes mellitus with diabetic neuropathy, unspecified: Secondary | ICD-10-CM | POA: Diagnosis not present

## 2021-08-09 DIAGNOSIS — M1990 Unspecified osteoarthritis, unspecified site: Secondary | ICD-10-CM | POA: Diagnosis not present

## 2021-08-09 DIAGNOSIS — G9341 Metabolic encephalopathy: Secondary | ICD-10-CM | POA: Diagnosis not present

## 2021-08-09 DIAGNOSIS — A419 Sepsis, unspecified organism: Secondary | ICD-10-CM | POA: Diagnosis not present

## 2021-08-09 DIAGNOSIS — G250 Essential tremor: Secondary | ICD-10-CM | POA: Diagnosis not present

## 2021-08-09 DIAGNOSIS — R531 Weakness: Secondary | ICD-10-CM | POA: Diagnosis not present

## 2021-08-09 LAB — CBC WITH DIFFERENTIAL (CANCER CENTER ONLY)
Abs Immature Granulocytes: 0.05 10*3/uL (ref 0.00–0.07)
Basophils Absolute: 0.1 10*3/uL (ref 0.0–0.1)
Basophils Relative: 1 %
Eosinophils Absolute: 0.2 10*3/uL (ref 0.0–0.5)
Eosinophils Relative: 2 %
HCT: 35.9 % — ABNORMAL LOW (ref 36.0–46.0)
Hemoglobin: 11.6 g/dL — ABNORMAL LOW (ref 12.0–15.0)
Immature Granulocytes: 1 %
Lymphocytes Relative: 20 %
Lymphs Abs: 1.9 10*3/uL (ref 0.7–4.0)
MCH: 30.9 pg (ref 26.0–34.0)
MCHC: 32.3 g/dL (ref 30.0–36.0)
MCV: 95.5 fL (ref 80.0–100.0)
Monocytes Absolute: 0.7 10*3/uL (ref 0.1–1.0)
Monocytes Relative: 7 %
Neutro Abs: 6.5 10*3/uL (ref 1.7–7.7)
Neutrophils Relative %: 69 %
Platelet Count: 159 10*3/uL (ref 150–400)
RBC: 3.76 MIL/uL — ABNORMAL LOW (ref 3.87–5.11)
RDW: 19.5 % — ABNORMAL HIGH (ref 11.5–15.5)
WBC Count: 9.2 10*3/uL (ref 4.0–10.5)
nRBC: 0 % (ref 0.0–0.2)

## 2021-08-09 LAB — CMP (CANCER CENTER ONLY)
ALT: 7 U/L (ref 0–44)
AST: 18 U/L (ref 15–41)
Albumin: 2.8 g/dL — ABNORMAL LOW (ref 3.5–5.0)
Alkaline Phosphatase: 177 U/L — ABNORMAL HIGH (ref 38–126)
Anion gap: 9 (ref 5–15)
BUN: 9 mg/dL (ref 8–23)
CO2: 22 mmol/L (ref 22–32)
Calcium: 8.5 mg/dL — ABNORMAL LOW (ref 8.9–10.3)
Chloride: 103 mmol/L (ref 98–111)
Creatinine: 0.58 mg/dL (ref 0.44–1.00)
GFR, Estimated: >60 mL/min (ref >60)
Glucose, Bld: 164 mg/dL — ABNORMAL HIGH (ref 70–99)
Potassium: 3.9 mmol/L (ref 3.5–5.1)
Sodium: 134 mmol/L — ABNORMAL LOW (ref 135–145)
Total Bilirubin: 0.5 mg/dL (ref 0.3–1.2)
Total Protein: 6.5 g/dL (ref 6.5–8.1)

## 2021-08-09 MED ORDER — SODIUM CHLORIDE 0.9% FLUSH
10.0000 mL | Freq: Once | INTRAVENOUS | Status: AC
  Administered 2021-08-09: 10 mL via INTRAVENOUS

## 2021-08-09 MED ORDER — HEPARIN SOD (PORK) LOCK FLUSH 100 UNIT/ML IV SOLN
500.0000 [IU] | Freq: Once | INTRAVENOUS | Status: AC
  Administered 2021-08-09: 500 [IU] via INTRAVENOUS

## 2021-08-09 MED ORDER — SODIUM CHLORIDE 0.9 % IV SOLN: INTRAVENOUS | Status: DC

## 2021-08-09 NOTE — Patient Instructions (Signed)
Rehydration, Adult Rehydration is the replacement of body fluids, salts, and minerals (electrolytes) that are lost during dehydration. Dehydration is when there is not enough water or other fluids in the body. This happens when you lose more fluids than you take in. Common causes of dehydration include: Not drinking enough fluids. This can occur when you are ill or doing activities that require a lot of energy, especially in hot weather. Conditions that cause loss of water or other fluids, such as diarrhea, vomiting, sweating, or urinating a lot. Other illnesses, such as fever or infection. Certain medicines, such as those that remove excess fluid from the body (diuretics). Symptoms of mild or moderate dehydration may include thirst, dry lips and mouth, and dizziness. Symptoms of severe dehydration may include increased heart rate, confusion, fainting, and not urinating. For severe dehydration, you may need to get fluids through an IV at the hospital. For mild or moderate dehydration, you can usually rehydrate at home by drinking certain fluids as told by your health care provider. What are the risks? Generally, rehydration is safe. However, taking in too much fluid (overhydration) can be a problem. This is rare. Overhydration can cause an electrolyte imbalance, kidney failure, or a decrease in salt (sodium) levels in the body. Supplies needed You will need an oral rehydration solution (ORS) if your health care provider tells you to use one. This is a drink to treat dehydration. It can be found in pharmacies and retail stores. How to rehydrate Fluids Follow instructions from your health care provider for rehydration. The kind of fluid and the amount you should drink depend on your condition. In general, you should choose drinks that you prefer. If told by your health care provider, drink an ORS. Make an ORS by following instructions on the package. Start by drinking small amounts, about  cup (120  mL) every 5-10 minutes. Slowly increase how much you drink until you have taken the amount recommended by your health care provider. Drink enough clear fluids to keep your urine pale yellow. If you were told to drink an ORS, finish it first, then start slowly drinking other clear fluids. Drink fluids such as: Water. This includes sparkling water and flavored water. Drinking only water can lead to having too little sodium in your body (hyponatremia). Follow the advice of your health care provider. Water from ice chips you suck on. Fruit juice with water you add to it (diluted). Sports drinks. Hot or cold herbal teas. Broth-based soups. Milk or milk products. Food Follow instructions from your health care provider about what to eat while you rehydrate. Your health care provider may recommend that you slowly begin eating regular foods in small amounts. Eat foods that contain a healthy balance of electrolytes, such as bananas, oranges, potatoes, tomatoes, and spinach. Avoid foods that are greasy or contain a lot of sugar. In some cases, you may get nutrition through a feeding tube that is passed through your nose and into your stomach (nasogastric tube, or NG tube). This may be done if you have uncontrolled vomiting or diarrhea. Beverages to avoid  Certain beverages may make dehydration worse. While you rehydrate, avoid drinking alcohol. How to tell if you are recovering from dehydration You may be recovering from dehydration if: You are urinating more often than before you started rehydrating. Your urine is pale yellow. Your energy level improves. You vomit less frequently. You have diarrhea less frequently. Your appetite improves or returns to normal. You feel less dizzy or less light-headed.   Your skin tone and color start to look more normal. Follow these instructions at home: Take over-the-counter and prescription medicines only as told by your health care provider. Do not take sodium  tablets. Doing this can lead to having too much sodium in your body (hypernatremia). Contact a health care provider if: You continue to have symptoms of mild or moderate dehydration, such as: Thirst. Dry lips. Slightly dry mouth. Dizziness. Dark urine or less urine than normal. Muscle cramps. You continue to vomit or have diarrhea. Get help right away if you: Have symptoms of dehydration that get worse. Have a fever. Have a severe headache. Have been vomiting and the following happens: Your vomiting gets worse or does not go away. Your vomit includes blood or green matter (bile). You cannot eat or drink without vomiting. Have problems with urination or bowel movements, such as: Diarrhea that gets worse or does not go away. Blood in your stool (feces). This may cause stool to look black and tarry. Not urinating, or urinating only a small amount of very dark urine, within 6-8 hours. Have trouble breathing. Have symptoms that get worse with treatment. These symptoms may represent a serious problem that is an emergency. Do not wait to see if the symptoms will go away. Get medical help right away. Call your local emergency services (911 in the [place] Do not drive yourself to the hospital. Summary Rehydration is the replacement of body fluids and minerals (electrolytes) that are lost during dehydration. Follow instructions from your health care provider for rehydration. The kind of fluid and amount you should drink depend on your condition. Slowly increase how much you drink until you have taken the amount recommended by your health care provider. Contact your health care provider if you continue to show signs of mild or moderate dehydration. This information is not intended to replace advice given to you by your health care provider. Make sure you discuss any questions you have with your health care provider. Document Revised: 05/05/2019 Document Reviewed: 03/15/2019 Elsevier Patient  Education  2023 [employer]  

## 2021-08-09 NOTE — Progress Notes (Signed)
Pt transferred back to infusion to start IVf's

## 2021-08-09 NOTE — Progress Notes (Signed)
[facility] OFFICE PROGRESS NOTE   Diagnosis: Pancreas cancer  INTERVAL HISTORY:   [name] was discharged from the hospital 08/01/2021 after hospital admission with cholecystitis.  A cholecystostomy tube and perihepatic drain remain in place.  She is on apixaban anticoagulation for treatment of DVT/pulmonary embolism.  She is now in a skilled nursing facility.  She reports participating in physical therapy.  She continues to feel too "weak "to resume chemotherapy.  She is here today with her daughter.  She does not have significant pain. She continues antibiotics via a left midline.  She continues to have bilateral foot numbness. Objective:  Vital signs in last 24 hours:  Blood pressure 118/75, pulse 100, temperature 98.3 F (36.8 C), temperature source Oral, resp. rate 20, height '5\' 3"'$  (1.6 m), weight 155 lb (70.3 kg), SpO2 98 %.    HEENT: No thrush or ulcers Resp: Decreased breath sounds at the right lower posterior chest, no respiratory distress Cardio: Regular rate and rhythm GI: No hepatosplenomegaly, nontender, right upper abdomen drains in place with gauze dressings Vascular: No leg edema    Portacath/PICC-without erythema  Lab Results:  Lab Results  Component Value Date   WBC 9.2 08/09/2021   HGB 11.6 (L) 08/09/2021   HCT 35.9 (L) 08/09/2021   MCV 95.5 08/09/2021   PLT 159 08/09/2021   NEUTROABS 6.5 08/09/2021    CMP  Lab Results  Component Value Date   NA 134 (L) 08/09/2021   K 3.9 08/09/2021   CL 103 08/09/2021   CO2 22 08/09/2021   GLUCOSE 164 (H) 08/09/2021   BUN 9 08/09/2021   CREATININE 0.58 08/09/2021   CALCIUM 8.5 (L) 08/09/2021   PROT 6.5 08/09/2021   ALBUMIN 2.8 (L) 08/09/2021   AST 18 08/09/2021   ALT 7 08/09/2021   ALKPHOS 177 (H) 08/09/2021   BILITOT 0.5 08/09/2021   GFRNONAA >60 08/09/2021   GFRAA 71 02/02/2019    Lab Results  Component Value Date   [id number] 5,545 (H) 07/06/2021    Medications: I have reviewed the  patient's current medications.   Assessment/Plan:  Adenocarcinoma the pancreas body, stage IV (cT4,cN0,pM1) Ultrasound abdomen 08/18/2020-possible hypoechoic pancreas body mass, small hypoechoic liver areas MRI abdomen 08/27/2020-pancreas body mass, multiple hepatic lesions consistent with metastases, right abdominal omental nodularity, pancreas mass extends to the celiac bifurcation and abuts the splenic vein and splenoportal confluence CT chest 09/07/2020-scattered pulmonary nodules concerning for metastases, pancreas body mass, hepatic metastases Ultrasound-guided biopsy of a right liver lesion 09/08/2020-adenocarcinoma consistent with a pancreas primary CT abdomen/pelvis 09/24/2020-pancreas neck mass, omental and peritoneal nodularity-unchanged, multiple hypoenhancing liver masses-unchanged, numerous small pulmonary nodules Cycle 1 gemcitabine/Abraxane 10/04/2020 Cycle 2 gemcitabine/Abraxane 10/27/2020 Cycle 3 gemcitabine/Abraxane 11/11/2018 Cycle 4 gemcitabine/Abraxane 11/23/2020 Cycle 5 gemcitabine/Abraxane 12/08/2020 CT abdomen/pelvis 12/18/2020-stable pancreas mass, stable and improved liver lesions, resolution of omental soft tissue density, stable indeterminate lung nodules, no disease progression Cycle 6 gemcitabine/Abraxane 12/22/2020 Cycle 7 gemcitabine 01/05/2021, 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  Cycle 8 gemcitabine/Abraxane 01/19/2021 Cycle 9 gemcitabine/Abraxane 02/01/2021 Cycle 10 Gemcitabine 02/16/2021, Abraxane held due to increased neuropathy Cycle 11 Gemcitabine 03/05/2021, 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 Cycle 12 gemcitabine 03/14/2021, 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 Cycle 13 gemcitabine 04/02/2021, 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 Cycle 14 gemcitabine/Abraxane 04/16/2021, Abraxane resumed at a reduced dose Cycle 15 gemcitabine/Abraxane 04/30/2021 CT abdomen/pelvis 05/08/2021-stable pancreas mass, liver metastases are slightly smaller, stable tiny bilateral lower lung nodules Cycle 16  gemcitabine/Abraxane 05/14/2021, Abraxane escalated back to 100 mg per metered squared Cycle  17 gemcitabine/Abraxane 05/28/2021 Cycle 18 gemcitabine/Abraxane 06/11/2021 Cycle 19 gemcitabine/Abraxane 07/09/2021 CT Abdo/pelvis 07/18/2021-diffuse gallbladder wall thickening and edema, small volume ascites, stable pulmonary nodules, indeterminate 15 x 18 mm right liver lesion, ill-defined hypodensity in the body of the pancreas 07/24/2021-right pleural fluid cytology-negative   2.  Pain secondary #1 3.  Diabetes 4.  Hypertension 5.  Osteoarthritis 6.  Port-A-Cath placement 09/22/2020 7.  Admission with jaundice 09/24/2020.  MRI-new abrupt constriction of the common hepatic duct.  The infiltrative pancreatic mass extends medially in the porta hepatis to obstruct the common hepatic duct.  Multiple right hepatic lobe metastases mildly increased in size.  Stent placed into the common bile duct 09/28/2020. 8.  COVID-19 06/20/2021, admission with COVID and bacterial pneumonia?  06/30/2021-completed course of antibiotics 9.  Hospital admission 07/18/2021-bacteremia, UTI, acute cholecystitis Cholecystostomy tube 07/20/2021 CT abdomen/pelvis 07/24/2021-moderate right pleural effusion, new subcapsular fluid collections adjacent to the right liver, interval cholecystostomy tube, stable pancreas body mass, incidental pulmonary embolism and a left lower lobe pulmonary artery Drain in perihepatic fluid collection 07/27/2021 10.  Pulmonary embolism/bilateral DVTs CT abdomen/pelvis 07/24/2021-incidental left lower lobe pulmonary embolism CT chest 07/24/2021-segmental and subsegmental bilateral pulmonary emboli Dopplers 07/24/2021-DVTs in the right common femoral, left common femoral, left profunda veins Heparin 07/24/2021, converted to apixaban on discharge 08/01/2021 11.  Bilateral foot numbness developing during hospital admission May 2023-Abraxane neuropathy?    Disposition: [name] continues to recover from the hospital admission  with cholecystitis/bacteremia.  She is continuing antibiotics for several more days.  She now resides in a skilled nursing facility for physical therapy and to complete the course of antibiotics.  She plans to return home.  She is scheduled for follow-up with interventional radiology next week.  Interventional radiology will decide when to remove the drains.  Chemotherapy will remain on hold.  She will return for an office visit 08/24/2021 with the plan to resume gemcitabine/Abraxane 08/27/2021.  Betsy Coder, MD  08/09/2021  11:19 AM

## 2021-08-09 NOTE — Progress Notes (Signed)
RN notified by Danae Orleans, LPN that patient would receive fluids while waiting for Dr visit.  No orders received. Patient transferred to Dr side.

## 2021-08-10 ENCOUNTER — Inpatient Hospital Stay: Payer: Medicare HMO

## 2021-08-10 ENCOUNTER — Ambulatory Visit: Payer: Medicare HMO

## 2021-08-10 ENCOUNTER — Ambulatory Visit: Payer: Medicare HMO | Admitting: Oncology

## 2021-08-10 ENCOUNTER — Other Ambulatory Visit: Payer: Medicare HMO

## 2021-08-11 LAB — CANCER ANTIGEN 19-9: CA 19-9: 16591 U/mL — ABNORMAL HIGH (ref 0–35)

## 2021-08-14 DIAGNOSIS — R6520 Severe sepsis without septic shock: Secondary | ICD-10-CM | POA: Diagnosis not present

## 2021-08-14 DIAGNOSIS — G250 Essential tremor: Secondary | ICD-10-CM | POA: Diagnosis not present

## 2021-08-14 DIAGNOSIS — F411 Generalized anxiety disorder: Secondary | ICD-10-CM | POA: Diagnosis not present

## 2021-08-14 DIAGNOSIS — G9341 Metabolic encephalopathy: Secondary | ICD-10-CM | POA: Diagnosis not present

## 2021-08-14 DIAGNOSIS — M6250 Muscle wasting and atrophy, not elsewhere classified, unspecified site: Secondary | ICD-10-CM | POA: Diagnosis not present

## 2021-08-14 DIAGNOSIS — A419 Sepsis, unspecified organism: Secondary | ICD-10-CM | POA: Diagnosis not present

## 2021-08-14 DIAGNOSIS — R2681 Unsteadiness on feet: Secondary | ICD-10-CM | POA: Diagnosis not present

## 2021-08-14 DIAGNOSIS — C259 Malignant neoplasm of pancreas, unspecified: Secondary | ICD-10-CM | POA: Diagnosis not present

## 2021-08-14 DIAGNOSIS — K819 Cholecystitis, unspecified: Secondary | ICD-10-CM | POA: Diagnosis not present

## 2021-08-14 DIAGNOSIS — R531 Weakness: Secondary | ICD-10-CM | POA: Diagnosis not present

## 2021-08-14 DIAGNOSIS — M159 Polyosteoarthritis, unspecified: Secondary | ICD-10-CM | POA: Diagnosis not present

## 2021-08-15 ENCOUNTER — Ambulatory Visit
Admission: RE | Admit: 2021-08-15 | Discharge: 2021-08-15 | Disposition: A | Discharge status: Home / Self Care | Payer: Medicare HMO | Source: Ambulatory Visit | Attending: Radiology | Admitting: Radiology

## 2021-08-15 ENCOUNTER — Ambulatory Visit
Admission: RE | Admit: 2021-08-15 | Discharge: 2021-08-15 | Disposition: A | Discharge status: Home / Self Care | Payer: Medicare HMO | Source: Ambulatory Visit | Attending: Surgery | Admitting: Surgery

## 2021-08-15 DIAGNOSIS — K819 Cholecystitis, unspecified: Secondary | ICD-10-CM

## 2021-08-15 DIAGNOSIS — K8689 Other specified diseases of pancreas: Secondary | ICD-10-CM | POA: Diagnosis not present

## 2021-08-15 DIAGNOSIS — K810 Acute cholecystitis: Secondary | ICD-10-CM | POA: Diagnosis not present

## 2021-08-15 DIAGNOSIS — K449 Diaphragmatic hernia without obstruction or gangrene: Secondary | ICD-10-CM | POA: Diagnosis not present

## 2021-08-15 DIAGNOSIS — Z434 Encounter for attention to other artificial openings of digestive tract: Secondary | ICD-10-CM | POA: Diagnosis not present

## 2021-08-15 DIAGNOSIS — K820 Obstruction of gallbladder: Secondary | ICD-10-CM | POA: Diagnosis not present

## 2021-08-15 DIAGNOSIS — K7689 Other specified diseases of liver: Secondary | ICD-10-CM | POA: Diagnosis not present

## 2021-08-15 HISTORY — PX: IR RADIOLOGIST EVAL & MGMT: IMG5224

## 2021-08-15 MED ORDER — IOPAMIDOL (ISOVUE-300) INJECTION 61%
100.0000 mL | Freq: Once | INTRAVENOUS | Status: AC | PRN
  Administered 2021-08-15: 100 mL via INTRAVENOUS

## 2021-08-15 NOTE — Progress Notes (Addendum)
Chief Complaint: The patient is seen in follow up today s/p admission for cholecystitis with placement cholecystotomy tube and perihepatic drain on  07/20/21 and 07/25/21, respectively  History of present illness: Ms Medema has been in treatment for pancreatic cancer and DVT.  She has paused chemo, is on [facility], and continues her antibiotic.  She was discharged on 5/17 after being treated for sepsis related to cholecystitis.  She received chole drain and perihepatic drain during the admission.  She is residing at [facility] where she and daughter report minimal drain care, involving minimal flushing and emptying reservoirs approximately 1x per week.  She endorses fatigue and intermittent generalized abdominal pain, nausea, and decreased appetite consistent with her normal.  She denies fever or chills.   Past Medical History:  Diagnosis Date   Anxiety    Arthritis    Cancer ([name])    pancreatic   Depression    Diabetes mellitus without complication ([name])    Hyperlipemia    Hypertension    Thyroid disease    TIA (transient ischemic attack)    Vitamin B12 deficiency 10/20/2019    Past Surgical History:  Procedure Laterality Date   ABDOMINAL HYSTERECTOMY     BILIARY STENT PLACEMENT N/A 09/28/2020   Procedure: BILIARY STENT PLACEMENT;  Surgeon: Vida Rigger, MD;  Location: [facility];  Service: Endoscopy;  Laterality: N/A;   BREAST BIOPSY     ERCP N/A 09/28/2020   Procedure: ENDOSCOPIC RETROGRADE CHOLANGIOPANCREATOGRAPHY (ERCP);  Surgeon: Vida Rigger, MD;  Location: [facility];  Service: Endoscopy;  Laterality: N/A;   IR IMAGING GUIDED PORT INSERTION  09/22/2020   IR PERC CHOLECYSTOSTOMY  07/20/2021   IR RADIOLOGIST EVAL & MGMT  08/15/2021   IR THORACENTESIS ASP PLEURAL SPACE W/IMG GUIDE  07/24/2021   SPHINCTEROTOMY  09/28/2020   Procedure: SPHINCTEROTOMY;  Surgeon: Vida Rigger, MD;  Location: [facility];  Service: Endoscopy;;   TUBAL LIGATION       Allergies: Penicillins  Medications: Prior to Admission medications   Medication Sig Start Date End Date Taking? Authorizing Provider  ACCU-CHEK AVIVA PLUS test strip As needed 05/09/21   Willow Ora, MD  Accu-Chek Softclix Lancets lancets As needed 04/23/21   Willow Ora, MD  Alcohol Swabs PADS USE AS DIRECTED 05/18/21   Willow Ora, MD  ALPRAZolam Prudy Feeler) 0.5 MG tablet Take 1 tablet (0.5 mg total) by mouth daily as needed for anxiety. Patient not taking: Reported on 08/09/2021 08/01/21   Kathlen Mody, MD  apixaban (ELIQUIS) 5 MG TABS tablet Take 2 tablets (10 mg total) by mouth 2 (two) times daily for 1 day. 08/01/21 08/02/21  Kathlen Mody, MD  apixaban (ELIQUIS) 5 MG TABS tablet Take 2 tablets (10 mg total) by mouth 2 (two) times daily for 1 day. then Take 1 tablet (5 mg total) by mouth 2 (two) times daily. 08/02/21   Kathlen Mody, MD  Blood Glucose Monitoring Suppl (ACCU-CHEK GUIDE) w/Device KIT As needed 05/11/21   Willow Ora, MD  calcium carbonate (TUMS EX) 750 MG chewable tablet Chew 1 tablet by mouth daily.    [provider]  cyanocobalamin (,VITAMIN B-12,) 1000 MCG/ML injection Inject 1 mL (1,000 mcg total) into the muscle every 30 (thirty) days. 01/11/21   Willow Ora, MD  docusate sodium (COLACE) 100 MG capsule Take 100 mg by mouth 2 (two) times daily as needed (constipation).    [provider]  famotidine (PEPCID) 20 MG tablet Take 1 tablet (20 mg total)  by mouth daily. 08/02/21   Kathlen Mody, MD  HYDROcodone-acetaminophen (NORCO/VICODIN) 5-325 MG tablet Take 0.5 tablets by mouth every 8 (eight) hours.    [provider]  levothyroxine (SYNTHROID) 125 MCG tablet TAKE 1 TABLET ON AN EMPTY STOMACH IN THE MORNING Patient taking differently: Take 125 mcg by mouth daily before breakfast. 06/04/21   Willow Ora, MD  lidocaine-prilocaine (EMLA) cream APPLY 1 APPLICATION TOPICALLY AS NEEDED. Patient taking differently: Apply 1 application.  topically daily as needed (numbing). 07/04/21   Ladene Artist, MD  LORazepam (ATIVAN) 0.5 MG tablet Take 0.5 mg by mouth as needed for anxiety.    [provider]  metroNIDAZOLE (FLAGYL) 500 MG tablet Take 500 mg by mouth 2 (two) times daily. Last dose will be 08/20/21    [provider]  Multiple Vitamin (MULTIVITAMIN WITH MINERALS) TABS tablet Take 1 tablet by mouth daily. 08/02/21   Kathlen Mody, MD  omeprazole (PRILOSEC) 20 MG capsule TAKE 1 CAPSULE BY MOUTH EVERY DAY Patient taking differently: Take 20 mg by mouth at bedtime. 04/18/21   Willow Ora, MD  polyethylene glycol powder (GLYCOLAX/MIRALAX) 17 GM/SCOOP powder Take 17 g by mouth daily. 08/02/21   Kathlen Mody, MD  prochlorperazine (COMPAZINE) 10 MG tablet Take 1 tablet (10 mg total) by mouth every 6 (six) hours as needed (Nausea or vomiting). 09/12/20   Malachy Mood, MD  traMADol (ULTRAM) 50 MG tablet Take 1 tablet (50 mg total) by mouth every 6 (six) hours as needed for moderate pain or severe pain. Patient not taking: Reported on 08/09/2021 08/01/21   Kathlen Mody, MD     Family History  Problem Relation Age of Onset   Diabetes Mother    Alzheimer's disease Mother    Dementia Mother    COPD Father    Emphysema Father    Heart disease Sister    Alcohol abuse Brother    Heart disease Brother    Emphysema Brother    Heart attack Daughter    Cancer Other        breast cancer    Social History   Socioeconomic History   Marital status: Married    Spouse name: Not on file   Number of children: Not on file   Years of education: Not on file   Highest education level: Not on file  Occupational History   Occupation: retired    Comment: Firefighter   Occupation: caregiver  Tobacco Use   Smoking status: Former    Packs/day: 1.00    Years: 10.00    Pack years: 10.00    Types: Cigarettes    Quit date: 05/25/1987    Years since quitting: 34.2   Smokeless tobacco: Never  Vaping Use   Vaping Use: Never used   Substance and Sexual Activity   Alcohol use: Yes    Alcohol/week: 21.0 standard drinks    Types: 21 Glasses of wine per week    Comment: 3 glasses of wine daily, she stopped in 08/2020   Drug use: No   Sexual activity: Not Currently    Birth control/protection: Post-menopausal  Other Topics Concern   Not on file  Social History Narrative   Not on file   Social Determinants of Health   Financial Resource Strain: Low Risk    Difficulty of Paying Living Expenses: Not hard at all  Food Insecurity: No Food Insecurity   Worried About Running Out of Food in the Last Year: Never true   Ran  Out of Food in the Last Year: Never true  Transportation Needs: No Transportation Needs   Lack of Transportation (Medical): No   Lack of Transportation (Non-Medical): No  Physical Activity: Insufficiently Active   Days of Exercise per Week: 2 days   Minutes of Exercise per Session: 50 min  Stress: No Stress Concern Present   Feeling of Stress : Only a little  Social Connections: Press photographer of Communication with Friends and Family: More than three times a week   Frequency of Social Gatherings with Friends and Family: More than three times a week   Attends Religious Services: 1 to 4 times per year   Active Member of [employer] or Organizations: Yes   Attends Banker Meetings: 1 to 4 times per year   Marital Status: Married    Vital Signs: BP 121/81   Pulse 98   Temp 98.5 F (36.9 C)   SpO2 98%   Physical Exam Constitutional:      General: She is not in acute distress.    Comments: Chronically ill appearing  HENT:     Head: Normocephalic and atraumatic.     Mouth/Throat:     Mouth: Mucous membranes are dry.     Pharynx: Oropharynx is clear.  Eyes:     Extraocular Movements: Extraocular movements intact.  Cardiovascular:     Rate and Rhythm: Normal rate.  Pulmonary:     Effort: Pulmonary effort is normal.  Abdominal:     General: Abdomen is flat. There  is no distension.     Tenderness: There is no abdominal tenderness.  Skin:    General: Skin is dry.  Neurological:     General: No focal deficit present.     Mental Status: She is alert.  Psychiatric:        Mood and Affect: Mood normal.        Thought Content: Thought content normal.    Imaging: IR Radiologist Eval & Mgmt  Result Date: 08/15/2021 Please refer to notes tab for details about interventional procedure. (Op Note)   Labs:  CBC: Recent Labs    07/31/21 0218 08/01/21 0209 08/06/21 1035 08/09/21 0936  WBC 12.5* 9.4 9.3 9.2  HGB 9.8* 9.6* 12.0 11.6*  HCT 30.4* 30.5* 36.7 35.9*  PLT 152 153 193 159    COAGS: Recent Labs    10/14/20 1457 06/30/21 0939 07/18/21 1503 07/19/21 0445 07/24/21 1306  INR 1.1 1.1 1.2 1.3* 1.2  APTT 45*  --  28  --   --     BMP: Recent Labs    07/30/21 0324 07/31/21 0218 08/01/21 0209 08/06/21 1035 08/09/21 0936  NA 134* 132* 134* 133* 134*  K 3.7 3.5 3.4* 3.8 3.9  CL 107 108 110 104 103  CO2 19* 19* 21* 22 22  GLUCOSE 105* 123* 126* 153* 164*  BUN 8 8 7* 8 9  CALCIUM 7.5* 7.4* 7.4* 8.0* 8.5*  CREATININE 0.58 0.60 0.61 0.51* 0.58  GFRNONAA >60 >60 >60  --  >60    LIVER FUNCTION TESTS: Recent Labs    07/25/21 0215 07/26/21 0322 07/27/21 0329 08/09/21 0936  BILITOT 0.9 0.8 0.3 0.5  AST 26 28 25 18   ALT 14 12 13 7   ALKPHOS 204* 188* 171* 177*  PROT 5.0* 4.7* 4.7* 6.5  ALBUMIN 1.7* 1.6* 1.6* 2.8*    Assessment:  Acute cholecystitis --s/p chole drain and drain into peri-hepatic fluid collection --with minimal to no OP at this  time --CT suggests improvement --perihepatic drain patent as it flushes easily --chole drain injection shows no filling beyond cystic duct  Will do capping trial and have patient return for imaging in 2 weeks.  Bags for reattachment supplied should symptoms return/worsen.  Recommend follow up with surgery, ID, and Oncology.  Questions of patient and daughter answered to satisfaction.   I made phone call to nurse at [facility] to communicate plan.   SignedSheliah Plane, PA 08/15/2021, 3:06 PM   Please refer to Dr. Milford Cage attestation of this note for management and plan.   ---------------------------------------  I reviewed the Advanced Practice Provider's note. I agree with the findings and plan as described.    Roanna Banning, MD [facility] Rad[facility]   Pager. [phone] Clinic. [phone]

## 2021-08-16 ENCOUNTER — Emergency Department (HOSPITAL_COMMUNITY): Payer: Medicare HMO

## 2021-08-16 ENCOUNTER — Emergency Department (HOSPITAL_COMMUNITY)
Admission: EM | Admit: 2021-08-16 | Discharge: 2021-08-17 | Disposition: A | Discharge status: Other Acute Inpatient Hospital | Payer: Medicare HMO | Attending: Emergency Medicine | Admitting: Emergency Medicine

## 2021-08-16 DIAGNOSIS — E039 Hypothyroidism, unspecified: Secondary | ICD-10-CM | POA: Diagnosis not present

## 2021-08-16 DIAGNOSIS — I10 Essential (primary) hypertension: Secondary | ICD-10-CM | POA: Diagnosis not present

## 2021-08-16 DIAGNOSIS — Z8507 Personal history of malignant neoplasm of pancreas: Secondary | ICD-10-CM | POA: Diagnosis not present

## 2021-08-16 DIAGNOSIS — E119 Type 2 diabetes mellitus without complications: Secondary | ICD-10-CM | POA: Diagnosis not present

## 2021-08-16 DIAGNOSIS — R1011 Right upper quadrant pain: Secondary | ICD-10-CM | POA: Insufficient documentation

## 2021-08-16 DIAGNOSIS — C801 Malignant (primary) neoplasm, unspecified: Secondary | ICD-10-CM | POA: Diagnosis not present

## 2021-08-16 DIAGNOSIS — K5730 Diverticulosis of large intestine without perforation or abscess without bleeding: Secondary | ICD-10-CM | POA: Diagnosis not present

## 2021-08-16 DIAGNOSIS — R1084 Generalized abdominal pain: Secondary | ICD-10-CM

## 2021-08-16 DIAGNOSIS — K8020 Calculus of gallbladder without cholecystitis without obstruction: Secondary | ICD-10-CM | POA: Diagnosis not present

## 2021-08-16 DIAGNOSIS — R1012 Left upper quadrant pain: Secondary | ICD-10-CM | POA: Insufficient documentation

## 2021-08-16 DIAGNOSIS — C799 Secondary malignant neoplasm of unspecified site: Secondary | ICD-10-CM | POA: Diagnosis not present

## 2021-08-16 DIAGNOSIS — N281 Cyst of kidney, acquired: Secondary | ICD-10-CM | POA: Diagnosis not present

## 2021-08-16 DIAGNOSIS — C259 Malignant neoplasm of pancreas, unspecified: Secondary | ICD-10-CM | POA: Diagnosis not present

## 2021-08-16 DIAGNOSIS — R1033 Periumbilical pain: Secondary | ICD-10-CM | POA: Diagnosis not present

## 2021-08-16 DIAGNOSIS — R109 Unspecified abdominal pain: Secondary | ICD-10-CM | POA: Diagnosis present

## 2021-08-16 DIAGNOSIS — R1013 Epigastric pain: Secondary | ICD-10-CM | POA: Diagnosis not present

## 2021-08-16 DIAGNOSIS — R110 Nausea: Secondary | ICD-10-CM | POA: Diagnosis not present

## 2021-08-16 DIAGNOSIS — C787 Secondary malignant neoplasm of liver and intrahepatic bile duct: Secondary | ICD-10-CM | POA: Diagnosis not present

## 2021-08-16 DIAGNOSIS — K819 Cholecystitis, unspecified: Secondary | ICD-10-CM | POA: Diagnosis not present

## 2021-08-16 LAB — COMPREHENSIVE METABOLIC PANEL
ALT: 11 U/L (ref 0–44)
AST: 31 U/L (ref 15–41)
Albumin: 1.7 g/dL — ABNORMAL LOW (ref 3.5–5.0)
Alkaline Phosphatase: 267 U/L — ABNORMAL HIGH (ref 38–126)
Anion gap: 6 (ref 5–15)
BUN: 16 mg/dL (ref 8–23)
CO2: 21 mmol/L — ABNORMAL LOW (ref 22–32)
Calcium: 8.3 mg/dL — ABNORMAL LOW (ref 8.9–10.3)
Chloride: 106 mmol/L (ref 98–111)
Creatinine, Ser: 0.69 mg/dL (ref 0.44–1.00)
GFR, Estimated: >60 mL/min (ref >60)
Glucose, Bld: 134 mg/dL — ABNORMAL HIGH (ref 70–99)
Potassium: 3.8 mmol/L (ref 3.5–5.1)
Sodium: 133 mmol/L — ABNORMAL LOW (ref 135–145)
Total Bilirubin: 0.5 mg/dL (ref 0.3–1.2)
Total Protein: 5.7 g/dL — ABNORMAL LOW (ref 6.5–8.1)

## 2021-08-16 LAB — CBC WITH DIFFERENTIAL/PLATELET
Abs Immature Granulocytes: 0.06 10*3/uL (ref 0.00–0.07)
Basophils Absolute: 0.1 10*3/uL (ref 0.0–0.1)
Basophils Relative: 1 %
Eosinophils Absolute: 0.3 10*3/uL (ref 0.0–0.5)
Eosinophils Relative: 3 %
HCT: 37.1 % (ref 36.0–46.0)
Hemoglobin: 11.7 g/dL — ABNORMAL LOW (ref 12.0–15.0)
Immature Granulocytes: 1 %
Lymphocytes Relative: 24 %
Lymphs Abs: 2.3 10*3/uL (ref 0.7–4.0)
MCH: 31.1 pg (ref 26.0–34.0)
MCHC: 31.5 g/dL (ref 30.0–36.0)
MCV: 98.7 fL (ref 80.0–100.0)
Monocytes Absolute: 0.8 10*3/uL (ref 0.1–1.0)
Monocytes Relative: 8 %
Neutro Abs: 6 10*3/uL (ref 1.7–7.7)
Neutrophils Relative %: 63 %
Platelets: 161 10*3/uL (ref 150–400)
RBC: 3.76 MIL/uL — ABNORMAL LOW (ref 3.87–5.11)
RDW: 18.8 % — ABNORMAL HIGH (ref 11.5–15.5)
WBC: 9.5 10*3/uL (ref 4.0–10.5)
nRBC: 0 % (ref 0.0–0.2)

## 2021-08-16 LAB — LIPASE, BLOOD: Lipase: 57 U/L — ABNORMAL HIGH (ref 11–51)

## 2021-08-16 MED ORDER — IOHEXOL 300 MG/ML  SOLN
80.0000 mL | Freq: Once | INTRAMUSCULAR | Status: AC | PRN
  Administered 2021-08-16: 80 mL via INTRAVENOUS

## 2021-08-16 MED ORDER — METOCLOPRAMIDE HCL 5 MG/ML IJ SOLN
10.0000 mg | Freq: Once | INTRAMUSCULAR | Status: AC
Start: 2021-08-16 — End: 2021-08-16
  Administered 2021-08-16: 10 mg via INTRAVENOUS
  Filled 2021-08-16: qty 2

## 2021-08-16 MED ORDER — MORPHINE SULFATE (PF) 4 MG/ML IV SOLN
4.0000 mg | Freq: Once | INTRAVENOUS | Status: AC
Start: 2021-08-16 — End: 2021-08-16
  Administered 2021-08-16: 4 mg via INTRAVENOUS
  Filled 2021-08-16: qty 1

## 2021-08-16 NOTE — ED Provider Notes (Signed)
[facility] EMERGENCY DEPARTMENT Provider Note   CSN: [id number] Arrival date & time: 08/16/21  2043     History  Chief Complaint  Patient presents with   Abdominal Pain    [name] is a 76 y.o. female.  Patient is a 76 year old female with a history of hypertension, hyperlipidemia, prior TIA, diabetes and pancreatic cancer status post recent cholecystectomy with drains still in place who reports the drains were capped yesterday and all day today she has had severe abdominal pain.  She reports prior to the drains being capped she was putting out very little output.  She had been taking hydrocodone every 6 hours at the rehab facility which somewhat controlled her pain but she would wake up in the middle of the night with breakthrough pain.  However today's pain has been excruciating and more severe.  She has been taking her nausea medication and reports she has not had any vomiting but has had very little to eat today.  No fever or urinary complaints.  She denies any fall or trauma to the abdomen.  She last had hydrocodone around 6 PM as well as her nausea medicine.  She had been getting chemotherapy but has not had it for a month since having the gallbladder surgery.  The history is provided by medical records, the patient and the nursing home.  Abdominal Pain     Home Medications Prior to Admission medications   Medication Sig Start Date End Date Taking? Authorizing Provider  ACCU-CHEK AVIVA PLUS test strip As needed 05/09/21   [name], MD  Accu-Chek Softclix Lancets lancets As needed 04/23/21   [name], MD  Alcohol Swabs PADS USE AS DIRECTED 05/18/21   [name], MD  ALPRAZolam Duanne Moron) 0.5 MG tablet Take 1 tablet (0.5 mg total) by mouth daily as needed for anxiety. Patient not taking: Reported on 08/09/2021 08/01/21   Hosie Poisson, MD  apixaban (ELIQUIS) 5 MG TABS tablet Take 2 tablets (10 mg total) by mouth 2 (two) times daily for 1 day.  08/01/21 08/02/21  Hosie Poisson, MD  apixaban (ELIQUIS) 5 MG TABS tablet Take 2 tablets (10 mg total) by mouth 2 (two) times daily for 1 day. then Take 1 tablet (5 mg total) by mouth 2 (two) times daily. 08/02/21   Hosie Poisson, MD  Blood Glucose Monitoring Suppl (ACCU-CHEK GUIDE) w/Device KIT As needed 05/11/21   [name], MD  calcium carbonate (TUMS EX) 750 MG chewable tablet Chew 1 tablet by mouth daily.    [provider]  cyanocobalamin (,VITAMIN B-12,) 1000 MCG/ML injection Inject 1 mL (1,000 mcg total) into the muscle every 30 (thirty) days. 01/11/21   [name], MD  docusate sodium (COLACE) 100 MG capsule Take 100 mg by mouth 2 (two) times daily as needed (constipation).    [provider]  famotidine (PEPCID) 20 MG tablet Take 1 tablet (20 mg total) by mouth daily. 08/02/21   Hosie Poisson, MD  HYDROcodone-acetaminophen (NORCO/VICODIN) 5-325 MG tablet Take 0.5 tablets by mouth every 8 (eight) hours.    [provider]  levothyroxine (SYNTHROID) 125 MCG tablet TAKE 1 TABLET ON AN EMPTY STOMACH IN THE MORNING Patient taking differently: Take 125 mcg by mouth daily before breakfast. 06/04/21   [name], MD  lidocaine-prilocaine (EMLA) cream APPLY 1 APPLICATION TOPICALLY AS NEEDED. Patient taking differently: Apply 1 application. topically daily as needed (numbing). 07/04/21   Ladell Pier, MD  LORazepam (  ATIVAN) 0.5 MG tablet Take 0.5 mg by mouth as needed for anxiety.    [provider]  metroNIDAZOLE (FLAGYL) 500 MG tablet Take 500 mg by mouth 2 (two) times daily. Last dose will be 08/20/21    [provider]  Multiple Vitamin (MULTIVITAMIN WITH MINERALS) TABS tablet Take 1 tablet by mouth daily. 08/02/21   Hosie Poisson, MD  omeprazole (PRILOSEC) 20 MG capsule TAKE 1 CAPSULE BY MOUTH EVERY DAY Patient taking differently: Take 20 mg by mouth at bedtime. 04/18/21   [name], MD  polyethylene glycol powder (GLYCOLAX/MIRALAX) 17  GM/SCOOP powder Take 17 g by mouth daily. 08/02/21   Hosie Poisson, MD  prochlorperazine (COMPAZINE) 10 MG tablet Take 1 tablet (10 mg total) by mouth every 6 (six) hours as needed (Nausea or vomiting). 09/12/20   Truitt Merle, MD  traMADol (ULTRAM) 50 MG tablet Take 1 tablet (50 mg total) by mouth every 6 (six) hours as needed for moderate pain or severe pain. Patient not taking: Reported on 08/09/2021 08/01/21   Hosie Poisson, MD      Allergies    Penicillins    Review of Systems   Review of Systems  Gastrointestinal:  Positive for abdominal pain.   Physical Exam Updated Vital Signs BP (!) 126/92 (BP Location: Right Arm)   Pulse (!) 104   Temp 98.2 F (36.8 C) (Oral)   Resp 12   SpO2 96%  Physical Exam Vitals and nursing note reviewed.  Constitutional:      General: She is not in acute distress.    Appearance: She is well-developed.     Comments: Appears uncomfortable  HENT:     Head: Normocephalic and atraumatic.  Eyes:     Conjunctiva/sclera: Conjunctivae normal.     Pupils: Pupils are equal, round, and reactive to light.  Cardiovascular:     Rate and Rhythm: Normal rate and regular rhythm.     Heart sounds: No murmur heard. Pulmonary:     Effort: Pulmonary effort is normal. No respiratory distress.     Breath sounds: Normal breath sounds. No wheezing or rales.  Abdominal:     General: There is no distension.     Palpations: Abdomen is soft.     Tenderness: There is abdominal tenderness in the right upper quadrant, epigastric area, periumbilical area and left upper quadrant. There is no guarding or rebound.     Comments: Bandage and drains are in place.  No leakage onto the bandage  Musculoskeletal:        General: No tenderness. Normal range of motion.     Cervical back: Normal range of motion and neck supple.  Skin:    General: Skin is warm and dry.     Findings: No erythema or rash.  Neurological:     Mental Status: She is alert and oriented to person, place, and  time.  Psychiatric:        Behavior: Behavior normal.    ED Results / Procedures / Treatments   Labs (all labs ordered are listed, but only abnormal results are displayed) Labs Reviewed  CBC WITH DIFFERENTIAL/PLATELET - Abnormal; Notable for the following components:      Result Value   RBC 3.76 (*)    Hemoglobin 11.7 (*)    RDW 18.8 (*)    All other components within normal limits  COMPREHENSIVE METABOLIC PANEL  LIPASE, BLOOD    EKG None  Radiology CT ABDOMEN PELVIS W CONTRAST  Result Date: 08/15/2021 CLINICAL  DATA:  Briefly, 76 year old female with a history of pancreatic tumor, perihepatic fluid collection and acute cholecystitis s/p perihepatic drain and cholecystostomy drain placement. EXAM: CT ABDOMEN AND PELVIS WITH CONTRAST TECHNIQUE: Multidetector CT imaging of the abdomen and pelvis was performed using the standard protocol following bolus administration of intravenous contrast. RADIATION DOSE REDUCTION: This exam was performed according to the departmental dose-optimization program which includes automated exposure control, adjustment of the mA and/or kV according to patient size and/or use of iterative reconstruction technique. CONTRAST:  151m ISOVUE-300 IOPAMIDOL (ISOVUE-300) INJECTION 61% COMPARISON:  IR fluoroscopy, earlier same day.  CT AP, 07/24/2021. FINDINGS: Lower chest: Persistent small volume bilateral pleural effusions, RIGHT-greater-than LEFT, and adjacent dependent consolidations Hepatobiliary: *Well-positioned percutaneous drainage catheter with near-complete resolution of previously large RIGHT perihepatic fluid collection. *Trace residual collection along the hepatic dome measures up to 5.0 x 1.0 cm, previously 17 cm. *Additional inferomedial perihepatic fluid collection measures 3.0 x 1.7 cm, previously 6 cm *Multifocal hypodense lesions within the LEFT and RIGHT hepatic lobes, largest at hepatic segment VI measuring 3.5 x 2.5 cm, suspicious for hepatic  metastases. *Stable positioning of cholecystostomy drain with contracted gallbladder. Metallic biliary stent. No intra or extrahepatic biliary ductal dilation. Pancreas: Normal contrast enhancement of the pancreatic head. Similar ill-defined hypodensity within the body of pancreas and pancreatic tail atrophy. Findings compatible with known pancreatic malignancy Spleen: New area of hypodensity within the inferior splenic pole, in an area measuring 3.0 x 2.0 cm. Adrenals/Urinary Tract: Adrenal glands are unremarkable. RIGHT exophytic and renal cortical renal cysts, unchanged. The LEFT kidney is normal. No renal calculi or hydronephrosis. Bladder is unremarkable. Stomach/Bowel: Small hiatus hernia. Nonobstructed bowel. Severe burden of sigmoid diverticulosis. No evidence of bowel wall thickening, distention, or inflammatory changes. Vascular/Lymphatic: Aortic atherosclerosis without aneurysmal dilation. No enlarged abdominal or pelvic lymph nodes. Reproductive: Status post hysterectomy. No adnexal mass. Other: Small volume of residual ascites within the dependent pelvis. Musculoskeletal: Mild body wall edema. Multilevel spinal degenerative change. No interval osseous abnormality. IMPRESSION: Since CT AP dated 07/24/2021; 1. Well-positioned percutaneous drainage catheter with near-complete resolution of previously large perihepatic free fluid collection. Small volume residual inferomedial hepatic fluid collection. 2. Stable positioning of cholecystostomy drain with contracted gallbladder. 3. Metallic biliary stent without intra- or extrahepatic biliary ductal dilation. 4. Pancreatic body mass consistent with known malignancy. Increased conspicuity of multifocal hepatic lesions, as above, is suspicious for metastases. 5. New hypodensity within the inferior splenic pole, suspicious for regional hypoperfusion and splenic infarct. 6. Bilateral small volume pleural effusions, greater on RIGHT. Additional incidental, chronic  and senescent findings as above. These results will be called to the ordering clinician or representative by the Radiologist Assistant, and communication documented in the PACS or [employer] Electronically Signed   By: JMichaelle BirksM.D.   On: 08/15/2021 17:41   DG Sinus/Fist Tube Chk-Non GI  Result Date: 08/15/2021 CLINICAL DATA:  Briefly, 76year old female with a history of pancreatic tumor, perihepatic fluid collection and acute cholecystitis s/p perihepatic drain and cholecystostomy drain placement. EXAM: CHOLECYSTOSTOMY DRAIN INJECTION COMPARISON:  CT AP, concurrent and 07/24/2021.  IR CT, 07/25/2021. CONTRAST:  40 mL Omnipaque 300-administered via the existing cholecystostomy drain. FLUOROSCOPY TIME:  Fluoroscopic dose; 11.3 mGy TECHNIQUE: The patient was positioned supine on the fluoroscopy table. A preprocedural spot fluoroscopic image was obtained of the RIGHT upper quadrant and the existing percutaneous cholecystostomy drainage catheter. Multiple spot fluoroscopic and radiographic images were obtained following the injection of a small amount of contrast via the existing  percutaneous cholecystostomy catheter. A contrast injection for the perihepatic drainage catheter was not performed. Procedure performed by Pasty Spillers, IR PA FINDINGS: Cholecystostomy drain injection without evidence of cystic duct patency. Metallic biliary stent. IMPRESSION: No fluoroscopic evidence of cystic duct patency. The cholecystostomy drain was therefore LEFT in place. Electronically Signed   By: Michaelle Birks M.D.   On: 08/15/2021 16:41   IR Radiologist Eval & Mgmt  Result Date: 08/15/2021 Please refer to notes tab for details about interventional procedure. (Op Note)   Procedures Procedures    Medications Ordered in ED Medications  morphine (PF) 4 MG/ML injection 4 mg (4 mg Intravenous Given 08/16/21 2221)  metoCLOPramide (REGLAN) injection 10 mg (10 mg Intravenous Given 08/16/21 2222)    ED Course/  Medical Decision Making/ A&P                           Medical Decision Making Amount and/or Complexity of Data Reviewed Independent Historian: EMS External Data Reviewed: notes.    Details: recent hospitalization Labs: ordered. Decision-making details documented in ED Course. Radiology: ordered.  Risk Prescription drug management. Parenteral controlled substances.   76 year old female with multiple medical problems and comorbidities and presenting today with a complaint that caries a high risk for morbidity and mortality.  Who is presenting today with worsening abdominal pain.  Only thing that has changed from yesterday to today is her drains were capped.  She had very minimal drainage out of them for days prior to being capped.  She does have upper abdominal pain and a history of pancreatic cancer with recent cholecystectomy at the beginning of March.  Patient is still on antibiotics which she will continue till next week.  She has been taking hydrocodone at the rehab facility which had been working moderately until today.  She denies any infectious symptoms she does not have any abdominal distention or drainage from around the ports.  Possibility for fluid reaccumulation causing pain versus other complications of her pancreatic cancer.  Low suspicion for an infectious etiology.  Labs are pending.  Patient given pain medication and CT pending.         Final Clinical Impression(s) / ED Diagnoses Final diagnoses:  None    Rx / DC Orders ED Discharge Orders     None         Blanchie Dessert, MD 08/16/21 2248

## 2021-08-16 NOTE — ED Triage Notes (Signed)
Per facility paperwork pt is currently on cefepime for bacteremia and gallbladder abscess

## 2021-08-16 NOTE — ED Triage Notes (Signed)
Pt arrives via EMS from [facility] with c/o abdominal pain, per EMS pt recently had "abdominal drains capped off" and has since then been having increasing pain. Pt has hx of pancreatic cancer and has not had treatment for over a month and normally gets it every 2 weeks

## 2021-08-16 NOTE — ED Notes (Signed)
Pt off unit to CT

## 2021-08-17 ENCOUNTER — Telehealth: Payer: Self-pay

## 2021-08-17 ENCOUNTER — Other Ambulatory Visit: Payer: Self-pay | Admitting: Surgery

## 2021-08-17 DIAGNOSIS — Z7189 Other specified counseling: Secondary | ICD-10-CM | POA: Diagnosis not present

## 2021-08-17 DIAGNOSIS — R410 Disorientation, unspecified: Secondary | ICD-10-CM | POA: Diagnosis not present

## 2021-08-17 DIAGNOSIS — E871 Hypo-osmolality and hyponatremia: Secondary | ICD-10-CM | POA: Diagnosis not present

## 2021-08-17 DIAGNOSIS — G9341 Metabolic encephalopathy: Secondary | ICD-10-CM | POA: Diagnosis not present

## 2021-08-17 DIAGNOSIS — B370 Candidal stomatitis: Secondary | ICD-10-CM | POA: Diagnosis not present

## 2021-08-17 DIAGNOSIS — J90 Pleural effusion, not elsewhere classified: Secondary | ICD-10-CM | POA: Diagnosis not present

## 2021-08-17 DIAGNOSIS — G309 Alzheimer's disease, unspecified: Secondary | ICD-10-CM | POA: Diagnosis present

## 2021-08-17 DIAGNOSIS — E860 Dehydration: Secondary | ICD-10-CM | POA: Diagnosis not present

## 2021-08-17 DIAGNOSIS — K650 Generalized (acute) peritonitis: Secondary | ICD-10-CM | POA: Diagnosis not present

## 2021-08-17 DIAGNOSIS — R4182 Altered mental status, unspecified: Secondary | ICD-10-CM | POA: Diagnosis not present

## 2021-08-17 DIAGNOSIS — R531 Weakness: Secondary | ICD-10-CM | POA: Diagnosis present

## 2021-08-17 DIAGNOSIS — N179 Acute kidney failure, unspecified: Secondary | ICD-10-CM | POA: Diagnosis present

## 2021-08-17 DIAGNOSIS — K810 Acute cholecystitis: Secondary | ICD-10-CM | POA: Diagnosis not present

## 2021-08-17 DIAGNOSIS — Z66 Do not resuscitate: Secondary | ICD-10-CM | POA: Diagnosis present

## 2021-08-17 DIAGNOSIS — C259 Malignant neoplasm of pancreas, unspecified: Secondary | ICD-10-CM | POA: Diagnosis present

## 2021-08-17 DIAGNOSIS — E8809 Other disorders of plasma-protein metabolism, not elsewhere classified: Secondary | ICD-10-CM | POA: Diagnosis not present

## 2021-08-17 DIAGNOSIS — E43 Unspecified severe protein-calorie malnutrition: Secondary | ICD-10-CM | POA: Diagnosis present

## 2021-08-17 DIAGNOSIS — Z978 Presence of other specified devices: Secondary | ICD-10-CM | POA: Diagnosis not present

## 2021-08-17 DIAGNOSIS — E119 Type 2 diabetes mellitus without complications: Secondary | ICD-10-CM | POA: Diagnosis present

## 2021-08-17 DIAGNOSIS — K769 Liver disease, unspecified: Secondary | ICD-10-CM | POA: Diagnosis not present

## 2021-08-17 DIAGNOSIS — Z7401 Bed confinement status: Secondary | ICD-10-CM | POA: Diagnosis not present

## 2021-08-17 DIAGNOSIS — F0284 Dementia in other diseases classified elsewhere, unspecified severity, with anxiety: Secondary | ICD-10-CM | POA: Diagnosis present

## 2021-08-17 DIAGNOSIS — M2550 Pain in unspecified joint: Secondary | ICD-10-CM | POA: Diagnosis not present

## 2021-08-17 DIAGNOSIS — Z452 Encounter for adjustment and management of vascular access device: Secondary | ICD-10-CM | POA: Diagnosis not present

## 2021-08-17 DIAGNOSIS — I2693 Single subsegmental pulmonary embolism without acute cor pulmonale: Secondary | ICD-10-CM | POA: Diagnosis present

## 2021-08-17 DIAGNOSIS — R41841 Cognitive communication deficit: Secondary | ICD-10-CM | POA: Diagnosis not present

## 2021-08-17 DIAGNOSIS — E039 Hypothyroidism, unspecified: Secondary | ICD-10-CM | POA: Diagnosis present

## 2021-08-17 DIAGNOSIS — E785 Hyperlipidemia, unspecified: Secondary | ICD-10-CM | POA: Diagnosis present

## 2021-08-17 DIAGNOSIS — K819 Cholecystitis, unspecified: Secondary | ICD-10-CM | POA: Diagnosis not present

## 2021-08-17 DIAGNOSIS — K5720 Diverticulitis of large intestine with perforation and abscess without bleeding: Secondary | ICD-10-CM | POA: Diagnosis present

## 2021-08-17 DIAGNOSIS — M6281 Muscle weakness (generalized): Secondary | ICD-10-CM | POA: Diagnosis not present

## 2021-08-17 DIAGNOSIS — M1990 Unspecified osteoarthritis, unspecified site: Secondary | ICD-10-CM | POA: Diagnosis present

## 2021-08-17 DIAGNOSIS — E118 Type 2 diabetes mellitus with unspecified complications: Secondary | ICD-10-CM | POA: Diagnosis not present

## 2021-08-17 DIAGNOSIS — I8290 Acute embolism and thrombosis of unspecified vein: Secondary | ICD-10-CM | POA: Diagnosis not present

## 2021-08-17 DIAGNOSIS — K219 Gastro-esophageal reflux disease without esophagitis: Secondary | ICD-10-CM | POA: Diagnosis not present

## 2021-08-17 DIAGNOSIS — R6520 Severe sepsis without septic shock: Secondary | ICD-10-CM | POA: Diagnosis not present

## 2021-08-17 DIAGNOSIS — Z1331 Encounter for screening for depression: Secondary | ICD-10-CM | POA: Diagnosis not present

## 2021-08-17 DIAGNOSIS — R5381 Other malaise: Secondary | ICD-10-CM | POA: Diagnosis not present

## 2021-08-17 DIAGNOSIS — C787 Secondary malignant neoplasm of liver and intrahepatic bile duct: Secondary | ICD-10-CM | POA: Diagnosis present

## 2021-08-17 DIAGNOSIS — Z515 Encounter for palliative care: Secondary | ICD-10-CM | POA: Diagnosis not present

## 2021-08-17 DIAGNOSIS — J9811 Atelectasis: Secondary | ICD-10-CM | POA: Diagnosis not present

## 2021-08-17 DIAGNOSIS — I10 Essential (primary) hypertension: Secondary | ICD-10-CM | POA: Diagnosis present

## 2021-08-17 DIAGNOSIS — K651 Peritoneal abscess: Secondary | ICD-10-CM | POA: Diagnosis present

## 2021-08-17 DIAGNOSIS — R110 Nausea: Secondary | ICD-10-CM | POA: Diagnosis not present

## 2021-08-17 DIAGNOSIS — N281 Cyst of kidney, acquired: Secondary | ICD-10-CM | POA: Diagnosis not present

## 2021-08-17 DIAGNOSIS — Z5181 Encounter for therapeutic drug level monitoring: Secondary | ICD-10-CM | POA: Diagnosis not present

## 2021-08-17 DIAGNOSIS — G253 Myoclonus: Secondary | ICD-10-CM | POA: Diagnosis not present

## 2021-08-17 DIAGNOSIS — R188 Other ascites: Secondary | ICD-10-CM | POA: Diagnosis not present

## 2021-08-17 DIAGNOSIS — R630 Anorexia: Secondary | ICD-10-CM | POA: Diagnosis not present

## 2021-08-17 DIAGNOSIS — R279 Unspecified lack of coordination: Secondary | ICD-10-CM | POA: Diagnosis not present

## 2021-08-17 DIAGNOSIS — R1312 Dysphagia, oropharyngeal phase: Secondary | ICD-10-CM | POA: Diagnosis not present

## 2021-08-17 DIAGNOSIS — Z8616 Personal history of COVID-19: Secondary | ICD-10-CM | POA: Diagnosis not present

## 2021-08-17 DIAGNOSIS — R64 Cachexia: Secondary | ICD-10-CM | POA: Diagnosis present

## 2021-08-17 MED ORDER — OXYCODONE-ACETAMINOPHEN 5-325 MG PO TABS
2.0000 | ORAL_TABLET | Freq: Once | ORAL | Status: AC
  Administered 2021-08-17: 2 via ORAL
  Filled 2021-08-17: qty 2

## 2021-08-17 MED ORDER — OXYCODONE-ACETAMINOPHEN 5-325 MG PO TABS: 1.0000 | ORAL_TABLET | Freq: Four times a day (QID) | ORAL | 0 refills | Status: AC | PRN

## 2021-08-17 NOTE — ED Notes (Signed)
Pt able to swallow pills with applesauce and was able to drink water by turning head to side. However, she wasn't able to sit up to swallow d/t pain.

## 2021-08-17 NOTE — ED Provider Notes (Signed)
Patient signed out pending CT scan.  Recent cholecystectomy and known pancreatic cancer.  She had her drains 2 days ago.  She has had increasing abdominal pain.  CT scan reviewed.  No significant change from CT 2 days ago.  I discussed the results with the patient and her daughter.  Daughter is questioning some findings on the CT 2 days ago.  She states that no one explained the findings to them 2 days ago.  Patient has known metastatic disease and a pancreatic mass.  There is also questionable splenic infarction although today's scan does not mention this and notes the spleen to be normal.  The patient does not specifically have pain in this area but more diffuse pain.  She is only on hydrocodone at home.  She is not currently undergoing chemotherapy and recently had surgery.  She may have some progressive cancer metastatic pain.  Recommend that they increase her pain regimen at the living facility.  Would have her follow-up with general surgery and her oncologist.  Physical Exam  BP 134/84   Pulse (!) 101   Temp 98.2 F (36.8 C) (Oral)   Resp 16   SpO2 96%   Physical Exam ABCs intact, no acute distress Chronically ill-appearing Procedures  Procedures  ED Course / MDM    Medical Decision Making Amount and/or Complexity of Data Reviewed Labs: ordered. Radiology: ordered.  Risk Prescription drug management.   Problem List Items Addressed This Visit   None Visit Diagnoses     Generalized abdominal pain    -  Primary   Metastatic malignant neoplasm, unspecified site [facility])                 Laycie Schriner, Barbette Hair, MD 08/17/21 [phone]

## 2021-08-17 NOTE — Discharge Instructions (Addendum)
You were seen today for ongoing abdominal pain.  Your CT scan is stable from 2 days ago.  Your pain may be related to progressive metastatic.  We will change her hydrocodone to oxycodone.  Do not take both.  Follow-up closely with your oncologist.

## 2021-08-17 NOTE — Telephone Encounter (Signed)
TC form Pt's daughter inquiring about CT scan results. Asked Pt's daughter who ordered the scan and Pt's daughter stated infectious disease MD ordered the scan. Informed Pt's daughter that would need to speak with the infectious disease doctor for clarity of scan results and I will inform Dr Benay Spice to take a look at them Pt also stated the Nurses at [facility] would like for Dr Benay Spice to handle Pt's pain medication. Informed Pt's daughter that Pt is in facility and medication is handled by facility and if the Nurses are requesting this they would need to call the office Informed Pt's daughter that Pt has an appointment on 08/24/21 and these requests can be handled at that time. Pt's daughter hung up before verbalization could be verified.

## 2021-08-17 NOTE — ED Notes (Signed)
Patient verbalizes understanding of d/c instructions. Opportunities for questions and answers were provided. Pt d/c from ED and transferred back to [facility] via [place].

## 2021-08-20 ENCOUNTER — Other Ambulatory Visit: Payer: Self-pay

## 2021-08-20 ENCOUNTER — Ambulatory Visit (INDEPENDENT_AMBULATORY_CARE_PROVIDER_SITE_OTHER): Payer: Medicare HMO | Admitting: Infectious Diseases

## 2021-08-20 ENCOUNTER — Inpatient Hospital Stay (HOSPITAL_COMMUNITY): Payer: Medicare HMO

## 2021-08-20 ENCOUNTER — Encounter: Payer: Self-pay | Admitting: Infectious Diseases

## 2021-08-20 ENCOUNTER — Telehealth: Payer: Self-pay

## 2021-08-20 ENCOUNTER — Inpatient Hospital Stay (HOSPITAL_COMMUNITY)
Admission: RE | Admit: 2021-08-20 | Discharge: 2021-08-31 | DRG: 640 | Disposition: A | Discharge status: Hospice | Payer: Medicare HMO | Source: Other Acute Inpatient Hospital | Attending: Internal Medicine | Admitting: Internal Medicine

## 2021-08-20 VITALS — BP 136/88 | HR 111 | Temp 97.4°F

## 2021-08-20 DIAGNOSIS — Z452 Encounter for adjustment and management of vascular access device: Secondary | ICD-10-CM | POA: Diagnosis not present

## 2021-08-20 DIAGNOSIS — E43 Unspecified severe protein-calorie malnutrition: Secondary | ICD-10-CM | POA: Diagnosis present

## 2021-08-20 DIAGNOSIS — M1990 Unspecified osteoarthritis, unspecified site: Secondary | ICD-10-CM | POA: Diagnosis present

## 2021-08-20 DIAGNOSIS — E118 Type 2 diabetes mellitus with unspecified complications: Secondary | ICD-10-CM | POA: Diagnosis not present

## 2021-08-20 DIAGNOSIS — I2693 Single subsegmental pulmonary embolism without acute cor pulmonale: Secondary | ICD-10-CM | POA: Diagnosis present

## 2021-08-20 DIAGNOSIS — J90 Pleural effusion, not elsewhere classified: Secondary | ICD-10-CM | POA: Diagnosis not present

## 2021-08-20 DIAGNOSIS — T361X5A Adverse effect of cephalosporins and other beta-lactam antibiotics, initial encounter: Secondary | ICD-10-CM | POA: Diagnosis not present

## 2021-08-20 DIAGNOSIS — Z79890 Hormone replacement therapy (postmenopausal): Secondary | ICD-10-CM

## 2021-08-20 DIAGNOSIS — K650 Generalized (acute) peritonitis: Secondary | ICD-10-CM

## 2021-08-20 DIAGNOSIS — E785 Hyperlipidemia, unspecified: Secondary | ICD-10-CM | POA: Diagnosis present

## 2021-08-20 DIAGNOSIS — C259 Malignant neoplasm of pancreas, unspecified: Secondary | ICD-10-CM | POA: Diagnosis not present

## 2021-08-20 DIAGNOSIS — R339 Retention of urine, unspecified: Secondary | ICD-10-CM | POA: Diagnosis not present

## 2021-08-20 DIAGNOSIS — R601 Generalized edema: Secondary | ICD-10-CM

## 2021-08-20 DIAGNOSIS — R531 Weakness: Secondary | ICD-10-CM

## 2021-08-20 DIAGNOSIS — G253 Myoclonus: Secondary | ICD-10-CM | POA: Diagnosis not present

## 2021-08-20 DIAGNOSIS — R5381 Other malaise: Secondary | ICD-10-CM | POA: Diagnosis not present

## 2021-08-20 DIAGNOSIS — E039 Hypothyroidism, unspecified: Secondary | ICD-10-CM | POA: Diagnosis present

## 2021-08-20 DIAGNOSIS — I8290 Acute embolism and thrombosis of unspecified vein: Secondary | ICD-10-CM | POA: Diagnosis not present

## 2021-08-20 DIAGNOSIS — Z66 Do not resuscitate: Secondary | ICD-10-CM | POA: Diagnosis present

## 2021-08-20 DIAGNOSIS — I10 Essential (primary) hypertension: Secondary | ICD-10-CM | POA: Diagnosis present

## 2021-08-20 DIAGNOSIS — Z7901 Long term (current) use of anticoagulants: Secondary | ICD-10-CM

## 2021-08-20 DIAGNOSIS — E860 Dehydration: Principal | ICD-10-CM | POA: Diagnosis present

## 2021-08-20 DIAGNOSIS — Z8616 Personal history of COVID-19: Secondary | ICD-10-CM | POA: Diagnosis not present

## 2021-08-20 DIAGNOSIS — N179 Acute kidney failure, unspecified: Secondary | ICD-10-CM | POA: Diagnosis present

## 2021-08-20 DIAGNOSIS — R627 Adult failure to thrive: Secondary | ICD-10-CM | POA: Diagnosis present

## 2021-08-20 DIAGNOSIS — Z5181 Encounter for therapeutic drug level monitoring: Secondary | ICD-10-CM | POA: Diagnosis not present

## 2021-08-20 DIAGNOSIS — Z515 Encounter for palliative care: Secondary | ICD-10-CM

## 2021-08-20 DIAGNOSIS — B370 Candidal stomatitis: Secondary | ICD-10-CM | POA: Diagnosis not present

## 2021-08-20 DIAGNOSIS — R110 Nausea: Secondary | ICD-10-CM | POA: Diagnosis not present

## 2021-08-20 DIAGNOSIS — E119 Type 2 diabetes mellitus without complications: Secondary | ICD-10-CM | POA: Diagnosis present

## 2021-08-20 DIAGNOSIS — F411 Generalized anxiety disorder: Secondary | ICD-10-CM | POA: Diagnosis present

## 2021-08-20 DIAGNOSIS — N281 Cyst of kidney, acquired: Secondary | ICD-10-CM | POA: Diagnosis not present

## 2021-08-20 DIAGNOSIS — K651 Peritoneal abscess: Secondary | ICD-10-CM | POA: Diagnosis present

## 2021-08-20 DIAGNOSIS — Z7189 Other specified counseling: Secondary | ICD-10-CM | POA: Diagnosis not present

## 2021-08-20 DIAGNOSIS — R64 Cachexia: Secondary | ICD-10-CM | POA: Diagnosis present

## 2021-08-20 DIAGNOSIS — Z7401 Bed confinement status: Secondary | ICD-10-CM

## 2021-08-20 DIAGNOSIS — C787 Secondary malignant neoplasm of liver and intrahepatic bile duct: Secondary | ICD-10-CM | POA: Diagnosis present

## 2021-08-20 DIAGNOSIS — K769 Liver disease, unspecified: Secondary | ICD-10-CM | POA: Diagnosis not present

## 2021-08-20 DIAGNOSIS — E8809 Other disorders of plasma-protein metabolism, not elsewhere classified: Secondary | ICD-10-CM | POA: Diagnosis not present

## 2021-08-20 DIAGNOSIS — E871 Hypo-osmolality and hyponatremia: Secondary | ICD-10-CM | POA: Diagnosis present

## 2021-08-20 DIAGNOSIS — F0284 Dementia in other diseases classified elsewhere, unspecified severity, with anxiety: Secondary | ICD-10-CM | POA: Diagnosis present

## 2021-08-20 DIAGNOSIS — R630 Anorexia: Secondary | ICD-10-CM | POA: Insufficient documentation

## 2021-08-20 DIAGNOSIS — G309 Alzheimer's disease, unspecified: Secondary | ICD-10-CM | POA: Diagnosis present

## 2021-08-20 DIAGNOSIS — L8990 Pressure ulcer of unspecified site, unspecified stage: Secondary | ICD-10-CM

## 2021-08-20 DIAGNOSIS — Z6828 Body mass index (BMI) 28.0-28.9, adult: Secondary | ICD-10-CM

## 2021-08-20 DIAGNOSIS — Z833 Family history of diabetes mellitus: Secondary | ICD-10-CM

## 2021-08-20 DIAGNOSIS — K5720 Diverticulitis of large intestine with perforation and abscess without bleeding: Secondary | ICD-10-CM | POA: Diagnosis present

## 2021-08-20 DIAGNOSIS — Z820 Family history of epilepsy and other diseases of the nervous system: Secondary | ICD-10-CM

## 2021-08-20 DIAGNOSIS — Z8249 Family history of ischemic heart disease and other diseases of the circulatory system: Secondary | ICD-10-CM

## 2021-08-20 DIAGNOSIS — J9811 Atelectasis: Secondary | ICD-10-CM | POA: Diagnosis not present

## 2021-08-20 DIAGNOSIS — K819 Cholecystitis, unspecified: Principal | ICD-10-CM | POA: Diagnosis present

## 2021-08-20 DIAGNOSIS — Z79899 Other long term (current) drug therapy: Secondary | ICD-10-CM

## 2021-08-20 DIAGNOSIS — Z825 Family history of asthma and other chronic lower respiratory diseases: Secondary | ICD-10-CM

## 2021-08-20 DIAGNOSIS — R338 Other retention of urine: Secondary | ICD-10-CM | POA: Clinically undetermined

## 2021-08-20 DIAGNOSIS — Z87891 Personal history of nicotine dependence: Secondary | ICD-10-CM

## 2021-08-20 DIAGNOSIS — Z880 Allergy status to penicillin: Secondary | ICD-10-CM

## 2021-08-20 DIAGNOSIS — G9341 Metabolic encephalopathy: Secondary | ICD-10-CM | POA: Diagnosis not present

## 2021-08-20 DIAGNOSIS — Z811 Family history of alcohol abuse and dependence: Secondary | ICD-10-CM

## 2021-08-20 MED ORDER — ADULT MULTIVITAMIN W/MINERALS CH
1.0000 | ORAL_TABLET | Freq: Every day | ORAL | Status: DC
  Administered 2021-08-21 – 2021-08-30 (×6): 1 via ORAL
  Filled 2021-08-20 (×9): qty 1

## 2021-08-20 MED ORDER — DOCUSATE SODIUM 100 MG PO CAPS: 100.0000 mg | ORAL_CAPSULE | Freq: Two times a day (BID) | ORAL | Status: DC | PRN

## 2021-08-20 MED ORDER — BISACODYL 10 MG RE SUPP
10.0000 mg | Freq: Once | RECTAL | Status: DC
Start: 2021-08-21 — End: 2021-08-31
  Filled 2021-08-20: qty 1

## 2021-08-20 MED ORDER — PANTOPRAZOLE SODIUM 40 MG PO TBEC
40.0000 mg | DELAYED_RELEASE_TABLET | Freq: Every day | ORAL | Status: DC
  Administered 2021-08-21 – 2021-08-22 (×2): 40 mg via ORAL
  Filled 2021-08-20 (×3): qty 1

## 2021-08-20 MED ORDER — ALPRAZOLAM 0.25 MG PO TABS
0.5000 mg | ORAL_TABLET | Freq: Every day | ORAL | Status: DC | PRN
  Administered 2021-08-21 – 2021-08-22 (×2): 0.5 mg via ORAL
  Administered 2021-08-24: 0.25 mg via ORAL
  Administered 2021-08-25 – 2021-08-31 (×4): 0.5 mg via ORAL
  Filled 2021-08-20 (×7): qty 2

## 2021-08-20 MED ORDER — METRONIDAZOLE 500 MG PO TABS
500.0000 mg | ORAL_TABLET | Freq: Two times a day (BID) | ORAL | Status: DC
  Administered 2021-08-21 – 2021-08-22 (×5): 500 mg via ORAL
  Filled 2021-08-20 (×6): qty 1

## 2021-08-20 MED ORDER — POLYETHYLENE GLYCOL 3350 17 G PO PACK
17.0000 g | PACK | Freq: Every day | ORAL | Status: DC
  Administered 2021-08-26 – 2021-08-31 (×4): 17 g via ORAL
  Filled 2021-08-20 (×8): qty 1

## 2021-08-20 MED ORDER — SODIUM CHLORIDE 0.9 % IV SOLN
2.0000 g | Freq: Two times a day (BID) | INTRAVENOUS | Status: DC
  Administered 2021-08-21 – 2021-08-23 (×6): 2 g via INTRAVENOUS
  Filled 2021-08-20 (×6): qty 12.5

## 2021-08-20 MED ORDER — SODIUM CHLORIDE 0.9 % IV SOLN: INTRAVENOUS | Status: DC

## 2021-08-20 MED ORDER — APIXABAN 5 MG PO TABS: 10.0000 mg | ORAL_TABLET | Freq: Two times a day (BID) | ORAL | Status: DC

## 2021-08-20 MED ORDER — APIXABAN 5 MG PO TABS
5.0000 mg | ORAL_TABLET | Freq: Two times a day (BID) | ORAL | Status: DC
  Administered 2021-08-21 – 2021-08-31 (×20): 5 mg via ORAL
  Filled 2021-08-20 (×21): qty 1

## 2021-08-20 MED ORDER — AMLODIPINE BESYLATE 5 MG PO TABS
5.0000 mg | ORAL_TABLET | Freq: Every day | ORAL | Status: DC
  Administered 2021-08-21 – 2021-08-31 (×10): 5 mg via ORAL
  Filled 2021-08-20 (×11): qty 1

## 2021-08-20 MED ORDER — OXYCODONE-ACETAMINOPHEN 5-325 MG PO TABS
1.0000 | ORAL_TABLET | Freq: Four times a day (QID) | ORAL | Status: DC | PRN
  Administered 2021-08-21 – 2021-08-22 (×5): 2 via ORAL
  Filled 2021-08-20 (×5): qty 2

## 2021-08-20 MED ORDER — ONDANSETRON HCL 4 MG/2ML IJ SOLN
4.0000 mg | Freq: Four times a day (QID) | INTRAMUSCULAR | Status: DC | PRN
  Administered 2021-08-21: 4 mg via INTRAVENOUS
  Filled 2021-08-20: qty 2

## 2021-08-20 MED ORDER — LEVOTHYROXINE SODIUM 25 MCG PO TABS
125.0000 ug | ORAL_TABLET | Freq: Every day | ORAL | Status: DC
  Administered 2021-08-22 – 2021-08-31 (×8): 125 ug via ORAL
  Filled 2021-08-20 (×9): qty 1

## 2021-08-20 MED ORDER — CALCIUM CARBONATE ANTACID 750 MG PO CHEW: 1.0000 | CHEWABLE_TABLET | Freq: Every day | ORAL | Status: DC

## 2021-08-20 MED ORDER — ONDANSETRON HCL 4 MG PO TABS: 4.0000 mg | ORAL_TABLET | Freq: Four times a day (QID) | ORAL | Status: DC | PRN

## 2021-08-20 NOTE — Assessment & Plan Note (Signed)
Patient noted to have bilateral segmental and subsegmental pulmonary emboli  Continue apixaban

## 2021-08-20 NOTE — H&P (Addendum)
History and Physical    Patient: [name] QVZ:[id number] DOB: 1945/08/21 DOA: 08/20/2021 DOS: the patient was seen and examined on 08/20/2021 PCP: [name], MD  Patient coming from: SNF  Chief Complaint: No chief complaint on file.  HPI: [name] is a 76 y.o. female with medical history significant for acute cholecystitis status post cholecystostomy, history of pancreatic cancer, hypothyroidism, hypertension, generalized anxiety disorder, history of venous thromboembolic disease, recent hospitalization for sepsis with Morganella morganii/E. coli bacteremia who was sent to the hospital as a direct admission for evaluation of poor oral intake and severe dehydration. Patient's daughter states that since her discharge her oral intake has been very poor but over the last several days she has not had anything to eat or drink due to persistent nausea and vomiting despite taking antiemetics as well as poor appetite.  She complains of feeling very weak and tired and has been unable to get out of bed for the last 2 days.  She feels her legs are very heavy and unable to bear her weight.  She denies having any falls. She complains of abdominal pain mostly in the left lumbar area which she rates a 5 x 10 in intensity at its worst.  But denies having any changes in her bowel habits.  She denies having any fever, no chills, no urinary symptoms, no dizziness, no lightheadedness, no chest pain, no shortness of breath, no headache, no blurred vision or focal deficit. Her daughter is concerned about her progressive decline and inability to participate in physical therapy due to weakness.  Review of Systems: As mentioned in the history of present illness. All other systems reviewed and are negative. Past Medical History:  Diagnosis Date   Anxiety    Arthritis    Cancer ([place])    pancreatic   Depression    Diabetes mellitus without complication ([place])    Hyperlipemia    Hypertension     Thyroid disease    TIA (transient ischemic attack)    Vitamin B12 deficiency 10/20/2019   Past Surgical History:  Procedure Laterality Date   ABDOMINAL HYSTERECTOMY     BILIARY STENT PLACEMENT N/A 09/28/2020   Procedure: BILIARY STENT PLACEMENT;  Surgeon: Clarene Essex, MD;  Location: [facility];  Service: Endoscopy;  Laterality: N/A;   BREAST BIOPSY     ERCP N/A 09/28/2020   Procedure: ENDOSCOPIC RETROGRADE CHOLANGIOPANCREATOGRAPHY (ERCP);  Surgeon: Clarene Essex, MD;  Location: [facility];  Service: Endoscopy;  Laterality: N/A;   IR IMAGING GUIDED PORT INSERTION  09/22/2020   IR PERC CHOLECYSTOSTOMY  07/20/2021   IR RADIOLOGIST EVAL & MGMT  08/15/2021   IR THORACENTESIS ASP PLEURAL SPACE W/IMG GUIDE  07/24/2021   SPHINCTEROTOMY  09/28/2020   Procedure: SPHINCTEROTOMY;  Surgeon: Clarene Essex, MD;  Location: [facility];  Service: Endoscopy;;   TUBAL LIGATION     Social History:  reports that she quit smoking about 34 years ago. Her smoking use included cigarettes. She has a 10.00 pack-year smoking history. She has never used smokeless tobacco. She reports that she does not currently use alcohol after a past usage of about 21.0 standard drinks per week. She reports that she does not use drugs.  Allergies  Allergen Reactions   Penicillins Hives and Other (See Comments)    Tolerated Ceftriaxone 2023  Has patient had a PCN reaction causing immediate rash, facial/tongue/throat swelling, SOB or lightheadedness with hypotension: No Has patient had a PCN reaction causing severe rash involving mucus membranes or  skin necrosis: No Has patient had a PCN reaction that required hospitalization No Has patient had a PCN reaction occurring within the last 10 years: No If all of the above answers are "NO", then may proceed with Cephalosporin use.    Family History  Problem Relation Age of Onset   Diabetes Mother    Alzheimer's disease Mother    Dementia Mother    COPD Father    Emphysema Father     Heart disease Sister    Alcohol abuse Brother    Heart disease Brother    Emphysema Brother    Heart attack Daughter    Cancer Other        breast cancer    Prior to Admission medications   Medication Sig Start Date End Date Taking? Authorizing Provider  ACCU-CHEK AVIVA PLUS test strip As needed 05/09/21   [name], MD  Accu-Chek Softclix Lancets lancets As needed 04/23/21   [name], MD  Alcohol Swabs PADS USE AS DIRECTED 05/18/21   [name], MD  ALPRAZolam Duanne Moron) 0.5 MG tablet Take 1 tablet (0.5 mg total) by mouth daily as needed for anxiety. 08/01/21   Hosie Poisson, MD  amLODipine (NORVASC) 5 MG tablet Take 5 mg by mouth daily.    [provider]  apixaban (ELIQUIS) 5 MG TABS tablet Take 2 tablets (10 mg total) by mouth 2 (two) times daily for 1 day. 08/01/21 08/20/21  Hosie Poisson, MD  apixaban (ELIQUIS) 5 MG TABS tablet Take 2 tablets (10 mg total) by mouth 2 (two) times daily for 1 day. then Take 1 tablet (5 mg total) by mouth 2 (two) times daily. 08/02/21   Hosie Poisson, MD  Blood Glucose Monitoring Suppl (ACCU-CHEK GUIDE) w/Device KIT As needed 05/11/21   [name], MD  calcium carbonate (TUMS EX) 750 MG chewable tablet Chew 1 tablet by mouth daily.    [provider]  cyanocobalamin (,VITAMIN B-12,) 1000 MCG/ML injection Inject 1 mL (1,000 mcg total) into the muscle every 30 (thirty) days. 01/11/21   [name], MD  docusate sodium (COLACE) 100 MG capsule Take 100 mg by mouth 2 (two) times daily as needed (constipation).    [provider]  famotidine (PEPCID) 20 MG tablet Take 1 tablet (20 mg total) by mouth daily. 08/02/21   Hosie Poisson, MD  levothyroxine (SYNTHROID) 125 MCG tablet TAKE 1 TABLET ON AN EMPTY STOMACH IN THE MORNING Patient taking differently: Take 125 mcg by mouth daily before breakfast. 06/04/21   [name], MD  lidocaine 4 % Place 1 patch onto the skin daily.    [provider]   lidocaine-prilocaine (EMLA) cream APPLY 1 APPLICATION TOPICALLY AS NEEDED. Patient taking differently: Apply 1 application. topically daily as needed (numbing). 07/04/21   Ladell Pier, MD  LORazepam (ATIVAN) 0.5 MG tablet Take 0.5 mg by mouth as needed for anxiety.    [provider]  metroNIDAZOLE (FLAGYL) 500 MG tablet Take 500 mg by mouth 2 (two) times daily. Last dose will be 08/20/21    [provider]  Multiple Vitamin (MULTIVITAMIN WITH MINERALS) TABS tablet Take 1 tablet by mouth daily. 08/02/21   Hosie Poisson, MD  omeprazole (PRILOSEC) 20 MG capsule TAKE 1 CAPSULE BY MOUTH EVERY DAY Patient taking differently: Take 20 mg by mouth at bedtime. 04/18/21   [name], MD  oxyCODONE (OXY IR/ROXICODONE) 5 MG immediate release tablet Take 5 mg by mouth 3 (three) times daily  as needed. 08/17/21   [provider]  oxyCODONE-acetaminophen (PERCOCET/ROXICET) 5-325 MG tablet Take 1-2 tablets by mouth every 6 (six) hours as needed for severe pain. 08/17/21   Horton, Barbette Hair, MD  polyethylene glycol powder (GLYCOLAX/MIRALAX) 17 GM/SCOOP powder Take 17 g by mouth daily. 08/02/21   Hosie Poisson, MD  prochlorperazine (COMPAZINE) 10 MG tablet Take 1 tablet (10 mg total) by mouth every 6 (six) hours as needed (Nausea or vomiting). 09/12/20   Truitt Merle, MD  traMADol (ULTRAM) 50 MG tablet Take 1 tablet (50 mg total) by mouth every 6 (six) hours as needed for moderate pain or severe pain. Patient not taking: Reported on 08/09/2021 08/01/21   Hosie Poisson, MD    Physical Exam: Vitals:   08/20/21 2200  BP: 132/90  Pulse: 87  Resp: 16  Temp: (!) 97.5 F (36.4 C)  TempSrc: Oral  SpO2: 100%  Weight: 72.8 kg  Height: '5\' 3"'  (1.6 m)   Physical Exam Vitals and nursing note reviewed.  Constitutional:      Comments: Chronically ill-appearing  HENT:     Head: Normocephalic and atraumatic.     Nose: Nose normal.     Mouth/Throat:     Mouth: Mucous membranes are dry.  Eyes:      Pupils: Pupils are equal, round, and reactive to light.  Cardiovascular:     Rate and Rhythm: Tachycardia present.  Pulmonary:     Effort: Pulmonary effort is normal.     Breath sounds: Normal breath sounds.  Abdominal:     General: Bowel sounds are normal.     Palpations: Abdomen is soft.     Comments: Tenderness left lower, cholecystostomy tube in place  Musculoskeletal:        General: Normal range of motion.     Cervical back: Normal range of motion and neck supple.  Skin:    General: Skin is warm and dry.  Neurological:     Mental Status: She is alert.     Motor: Weakness present.  Psychiatric:        Mood and Affect: Mood normal.        Behavior: Behavior normal.    Data Reviewed: Relevant notes from primary care and specialist visits, past discharge summaries as available in EHR, including Care Everywhere. Prior diagnostic testing as pertinent to current admission diagnoses Updated medications and problem lists for reconciliation ED course, including vitals, labs, imaging, treatment and response to treatment Triage notes, nursing and pharmacy notes and ED provider's notes Notable results as noted in HPI Labs reviewed.  Glucose 153, BUN 31, creatinine 1.05 compared to baseline of 0.69, sodium 133, potassium 4.0, chloride 104, bicarb 19, calcium 8.6, white count 12.5, hemoglobin 13.1, hematocrit 40, platelet count 272 CT scan of abdomen and pelvis (06/01) Essentially stable CT since 2 days prior. Small bilateral pleural effusions with compressive atelectasis.Numerous metastases within the liver, stable. Perihepatic fluid collections are stable. Drainage catheter within a right perihepatic fluid collection and in the gallbladder. Pancreatic body mass compatible with known pancreatic malignancy. Pancreatic tail atrophy. Large stool burden in the colon. Aortic atherosclerosis. Moderate free fluid in the pelvis.  There are no new results to review at this time.  Assessment  and Plan: Dehydration with hyponatremia Secondary to poor oral intake Patient noted to have dry mucous membranes and poor skin turgor Serum creatinine is elevated at 1.06 compared to her baseline of 0.69 and sodium levels are low at 133 Hydrate patient with normal saline Repeat  electrolytes in a.m.  Weakness Secondary to poor oral intake as well as multiple comorbid conditions. Patient was discharged to subacute rehab but has been unable to participate in physical therapy due to weakness Fall precautions  Diet-controlled diabetes mellitus ([place]) Start patient on a clear liquid diet and advance as tolerated  Essential hypertension Blood pressure is stable Continue amlodipine  Acquired hypothyroidism Stable Continue Synthroid  VTE (venous thromboembolism) Patient noted to have bilateral segmental and subsegmental pulmonary emboli  Continue apixaban  Cholecystitis Status post cholecystostomy tube insertion With E. 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  All questions and concerns have been addressed.  They verbalized understanding and agree with the plan.  Severity of Illness: The appropriate patient status for this patient is INPATIENT. Inpatient status is judged to be reasonable and necessary in order to provide the required intensity of service to ensure the patient's safety. The patient's presenting symptoms, physical exam findings, and initial radiographic and laboratory data in the context of their chronic comorbidities is felt to place them at  high risk for further clinical deterioration. Furthermore, it is not anticipated that the patient will be medically stable for discharge from the hospital within 2 midnights of admission.   * I certify that at the point of admission it is my clinical judgment that the patient will require inpatient hospital care spanning beyond 2 midnights from the point of admission due to high intensity of service, high risk for further deterioration and high frequency of surveillance required.*  Author: Collier Bullock, MD 08/20/2021 11:33 PM  For on call review [email or web address].

## 2021-08-20 NOTE — Assessment & Plan Note (Addendum)
6.3 a1c 06/2021 -Blood glucose level of 119 this morning on lab work.   -Bicarb drip has been discontinued -Saline lock IV fluids. -Supportive care.

## 2021-08-20 NOTE — Assessment & Plan Note (Addendum)
Secondary to poor oral intake as well as multiple comorbid conditions in the setting of metastatic cancer. Possibly related to capping trial? Follow symptoms after this. PT/OT assessed patient recommending SNF however it is noted that family and patient want patient to go home with home health. -Patient continues to be weak, oncology following and recommending home with hospice versus outpatient palliative care. -Patient will likely be discharged home with hospice as patient has decided this later on this afternoon.

## 2021-08-20 NOTE — Progress Notes (Addendum)
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 ([place]) 09/12/2020   Vitamin B12 deficiency 10/20/2019   GAD (generalized anxiety disorder) 10/20/2019   Diet-controlled diabetes mellitus ([place]) 10/20/2019   Osteoarthritis, multiple sites 10/20/2019   Presbycusis of both ears 04/13/2019   Essential tremor 04/13/2019   Bilateral primary osteoarthritis of knee 09/23/2017   Acquired hypothyroidism 05/24/2012   Alcohol abuse, daily use 05/24/2012   Essential hypertension    Current Outpatient Medications on File Prior to Visit  Medication Sig Dispense Refill   ACCU-CHEK AVIVA PLUS test strip As needed 100 each 3   Accu-Chek Softclix Lancets lancets As needed 100 each 3   Alcohol Swabs PADS USE AS DIRECTED 100 each 4   ALPRAZolam (XANAX) 0.5 MG tablet Take 1 tablet (0.5 mg total) by mouth daily as needed for anxiety. (Patient not taking: Reported on 08/09/2021) 2 tablet 0   apixaban (ELIQUIS) 5 MG TABS tablet Take 2 tablets (10 mg total) by mouth 2 (two) times daily for 1 day. 2 tablet 0   apixaban (ELIQUIS) 5 MG TABS tablet Take 2 tablets (10 mg total) by mouth 2 (two) times daily for 1 day. then Take 1 tablet (5 mg total) by mouth 2 (two) times daily. 64 tablet 0   Blood Glucose Monitoring Suppl (ACCU-CHEK GUIDE) w/Device KIT As needed 1 kit 0   calcium carbonate (TUMS EX) 750 MG chewable tablet Chew 1 tablet by mouth daily.     cyanocobalamin  (,VITAMIN B-12,) 1000 MCG/ML injection Inject 1 mL (1,000 mcg total) into the muscle every 30 (thirty) days.     docusate sodium (COLACE) 100 MG capsule Take 100 mg by mouth 2 (two) times daily as needed (constipation).     famotidine (PEPCID) 20 MG tablet Take 1 tablet (20 mg total) by mouth daily. 30 tablet 0   levothyroxine (SYNTHROID) 125 MCG tablet TAKE 1 TABLET ON AN EMPTY STOMACH IN THE MORNING (Patient taking differently: Take 125 mcg by mouth daily before breakfast.) 90 tablet 1   lidocaine-prilocaine (EMLA) cream APPLY 1 APPLICATION TOPICALLY AS NEEDED. (Patient taking differently: Apply 1 application. topically daily as needed (numbing).) 30 g 2   LORazepam (ATIVAN) 0.5 MG tablet Take 0.5 mg by mouth as needed for anxiety.     metroNIDAZOLE (FLAGYL) 500 MG tablet Take 500 mg by mouth 2 (two) times daily. Last dose will be 08/20/21     Multiple Vitamin (MULTIVITAMIN WITH MINERALS) TABS tablet Take 1 tablet by mouth daily.     omeprazole (PRILOSEC) 20 MG capsule TAKE 1 CAPSULE BY MOUTH EVERY DAY (Patient taking differently: Take 20 mg by mouth at bedtime.) 90 capsule 1   oxyCODONE-acetaminophen (PERCOCET/ROXICET) 5-325 MG tablet Take 1-2 tablets by mouth every 6 (six) hours as needed for severe pain. 15 tablet 0   polyethylene glycol powder (GLYCOLAX/MIRALAX) 17 GM/SCOOP powder Take 17 g by mouth daily. 238 g 0   prochlorperazine (COMPAZINE) 10 MG tablet Take 1 tablet (10 mg total)  by mouth every 6 (six) hours as needed (Nausea or vomiting). 30 tablet 1   traMADol (ULTRAM) 50 MG tablet Take 1 tablet (50 mg total) by mouth every 6 (six) hours as needed for moderate pain or severe pain. (Patient not taking: Reported on 08/09/2021) 12 tablet 0   No current facility-administered medications on file prior to visit.   Subjective: After last seen on 5/22, patient seen by IR 5/31. CT abd/pelvis 5/31 with near complete resolution of previous large perihepatic free fluid collection. Small volume  residual inferomedial hepatic fluid collection. Ne hypodensity within the inferior splenic pole suspicious for regional hypoperfusion and splenic infarct. Cholecystostomy drain without evidence of cystic duct patency. Plan for capping trial and fu imaging in 2 weeks per IR. Patient was seen in the ED 6/1 for worsening abdominal pain for which CT was done 6/1 with no new findings from CT abd/pelvis done on 5/31.   Patient is accompanied bu daughter who says that patient's appetite has significantly gone down in the  last few days. She looks dehydrated and overall very tired and lethargic.  She has been nauseous and is taking antiemetics, she vomited while on the way to the clinic today. Daughter strongly thinks this is not her usual self and thinks she needs to go to the hospital. Patient denies fevers, chills. Abdominal pain also getting better but her appetite is poor. I advised her to go to the ED for possibly need of IVF as she has not eaten much in the last few days. Patient refused to go to ED and daughter really does not want her to go back to facility given she has not eaten well. Daughter has also concerns about her care at the facility. They however agreed to be directly admitted. Daughter told me they have been also trying to reach Dr Carin Hock office given her persistent poor po appetite/progressive decline.   Review of Systems: ROS all systems reviewed with pertinent positives and negatives as listed above  Past Medical History:  Diagnosis Date   Anxiety    Arthritis    Cancer ([place])    pancreatic   Depression    Diabetes mellitus without complication ([place])    Hyperlipemia    Hypertension    Thyroid disease    TIA (transient ischemic attack)    Vitamin B12 deficiency 10/20/2019   Past Surgical History:  Procedure Laterality Date   ABDOMINAL HYSTERECTOMY     BILIARY STENT PLACEMENT N/A 09/28/2020   Procedure: BILIARY STENT PLACEMENT;  Surgeon: Clarene Essex, MD;  Location: [facility]  ENDOSCOPY;  Service: Endoscopy;  Laterality: N/A;   BREAST BIOPSY     ERCP N/A 09/28/2020   Procedure: ENDOSCOPIC RETROGRADE CHOLANGIOPANCREATOGRAPHY (ERCP);  Surgeon: Clarene Essex, MD;  Location: [facility];  Service: Endoscopy;  Laterality: N/A;   IR IMAGING GUIDED PORT INSERTION  09/22/2020   IR PERC CHOLECYSTOSTOMY  07/20/2021   IR RADIOLOGIST EVAL & MGMT  08/15/2021   IR THORACENTESIS ASP PLEURAL SPACE W/IMG GUIDE  07/24/2021   SPHINCTEROTOMY  09/28/2020   Procedure: SPHINCTEROTOMY;  Surgeon: Clarene Essex, MD;  Location: [facility] ENDOSCOPY;  Service: Endoscopy;;   TUBAL LIGATION      Social History   Tobacco Use   Smoking status: Former    Packs/day: 1.00    Years: 10.00    Pack years: 10.00    Types: Cigarettes    Quit date: 05/25/1987    Years since quitting: 34.2   Smokeless tobacco: Never  Vaping Use  Vaping Use: Never used  Substance Use Topics   Alcohol use: Yes    Alcohol/week: 21.0 standard drinks    Types: 21 Glasses of wine per week    Comment: 3 glasses of wine daily, she stopped in 08/2020   Drug use: No    Family History  Problem Relation Age of Onset   Diabetes Mother    Alzheimer's disease Mother    Dementia Mother    COPD Father    Emphysema Father    Heart disease Sister    Alcohol abuse Brother    Heart disease Brother    Emphysema Brother    Heart attack Daughter    Cancer Other        breast cancer    Allergies  Allergen Reactions   Penicillins Hives and Other (See Comments)    Tolerated Ceftriaxone 2023  Has patient had a PCN reaction causing immediate rash, facial/tongue/throat swelling, SOB or lightheadedness with hypotension: No Has patient had a PCN reaction causing severe rash involving mucus membranes or skin necrosis: No Has patient had a PCN reaction that required hospitalization No Has patient had a PCN reaction occurring within the last 10 years: No If all of the above answers are "NO", then may proceed with Cephalosporin use.    Health  Maintenance  Topic Date Due   COVID-19 Vaccine (3 - Moderna risk series) 07/18/2020   FOOT EXAM  07/26/2021   INFLUENZA VACCINE  10/16/2021   HEMOGLOBIN A1C  01/09/2022   MAMMOGRAM  05/24/2022   OPHTHALMOLOGY EXAM  06/17/2022   TETANUS/TDAP  12/21/2023   Pneumonia Vaccine 55+ Years old  Completed   DEXA SCAN  Completed   Hepatitis C Screening  Completed   HPV VACCINES  Aged Out   Zoster Vaccines- Shingrix  Discontinued    Objective: BP 136/88   Pulse (!) 111   Temp (!) 97.4 F (36.3 C) (Oral)   SpO2 96%   Physical Exam Constitutional:      Appearance: Elderly female sitting in the wheelchair, tremors, lethargic  HENT:     Head: Normocephalic and atraumatic.      Mouth: Mucous membranes are dry     Conjunctiva/sclera: Conjunctivae normal.     Pupils:   Cardiovascular:     Rate and Rhythm: Normal rate and regular rhythm.     Heart sounds:   Pulmonary:     Effort: Pulmonary effort is normal.     Breath sounds: Normal breath sounds.   Abdominal:     General: Non distended     Palpations: soft. 2 capped drains in the rt side of abdomen Skin:    General: Skin is warm and dry.     Comments:  Neurological:     General: grossly non focal     Mental Status: awake, alert and oriented to person, place, and time.   Psychiatric:        Mood and Affect: Mood normal.   Lab Results Lab Results  Component Value Date   WBC 9.5 08/16/2021   HGB 11.7 (L) 08/16/2021   HCT 37.1 08/16/2021   MCV 98.7 08/16/2021   PLT 161 08/16/2021    Lab Results  Component Value Date   CREATININE 0.69 08/16/2021   BUN 16 08/16/2021   NA 133 (L) 08/16/2021   K 3.8 08/16/2021   CL 106 08/16/2021   CO2 21 (L) 08/16/2021    Lab Results  Component Value Date   ALT 11 08/16/2021   AST  31 08/16/2021   ALKPHOS 267 (H) 08/16/2021   BILITOT 0.5 08/16/2021    Lab Results  Component Value Date   CHOL 143 01/11/2021   HDL 51.20 01/11/2021   LDLCALC 73 01/11/2021   TRIG 94.0  01/11/2021   CHOLHDL 3 01/11/2021   No results found for: LABRPR, RPRTITER No results found for: HIV1RNAQUANT, HIV1RNAVL, CD4TABS   Microbiology Results for orders placed or performed during the hospital encounter of 07/18/21  Resp Panel by RT-PCR (Flu A&B, Covid) Nasopharyngeal Swab     Status: Abnormal   Collection Time: 07/18/21  2:47 PM   Specimen: Nasopharyngeal Swab; Nasopharyngeal(NP) swabs in vial transport medium  Result Value Ref Range Status   SARS Coronavirus 2 by RT PCR POSITIVE (A) NEGATIVE Final    Comment: (NOTE) SARS-CoV-2 target nucleic acids are DETECTED.  The SARS-CoV-2 RNA is generally detectable in upper respiratory specimens during the acute phase of infection. Positive results are indicative of the presence of the identified virus, but do not rule out bacterial infection or co-infection with other pathogens not detected by the test. Clinical correlation with patient history and other diagnostic information is necessary to determine patient infection status. The expected result is Negative.  Fact Sheet for Patients: EntrepreneurPulse.com.au  Fact Sheet for Healthcare Providers: IncredibleEmployment.be  This test is not yet approved or cleared by the [place] FDA and  has been authorized for detection and/or diagnosis of SARS-CoV-2 by FDA under an Emergency Use Authorization (EUA).  This EUA will remain in effect (meaning this test can be used) for the duration of  the COVID-19 declaration under Section 564(b)(1) of the A ct, 21 U.S.C. section 360bbb-3(b)(1), unless the authorization is terminated or revoked sooner.     Influenza A by PCR NEGATIVE NEGATIVE Final   Influenza B by PCR NEGATIVE NEGATIVE Final    Comment: (NOTE) The Xpert Xpress SARS-CoV-2/FLU/RSV plus assay is intended as an aid in the diagnosis of influenza from Nasopharyngeal swab specimens and should not be used as a sole basis for treatment.  Nasal washings and aspirates are unacceptable for Xpert Xpress SARS-CoV-2/FLU/RSV testing.  Fact Sheet for Patients: EntrepreneurPulse.com.au  Fact Sheet for Healthcare Providers: IncredibleEmployment.be  This test is not yet approved or cleared by the [place] FDA and has been authorized for detection and/or diagnosis of SARS-CoV-2 by FDA under an Emergency Use Authorization (EUA). This EUA will remain in effect (meaning this test can be used) for the duration of the COVID-19 declaration under Section 564(b)(1) of the Act, 21 U.S.C. section 360bbb-3(b)(1), unless the authorization is terminated or revoked.  Performed at [facility], [place] [address] [place], [place] [address]   Culture, blood (Routine x 2)     Status: Abnormal   Collection Time: 07/18/21  3:03 PM   Specimen: BLOOD LEFT HAND  Result Value Ref Range Status   Specimen Description BLOOD LEFT HAND  Final   Special Requests   Final    BOTTLES DRAWN AEROBIC AND ANAEROBIC Blood Culture results may not be optimal due to an inadequate volume of blood received in culture bottles   Culture  Setup Time   Final    GRAM NEGATIVE RODS ANAEROBIC BOTTLE ONLY CRITICAL VALUE NOTED.  VALUE IS CONSISTENT WITH PREVIOUSLY REPORTED AND CALLED VALUE. IN BOTH AEROBIC AND ANAEROBIC BOTTLES    Culture (A)  Final    MORGANELLA MORGANII CRITICAL RESULT CALLED TO, READ BACK BY AND VERIFIED WITH: PHARMD G.BARR AT 1221 ON 07/21/2021 BY T.SAAD. Performed  at [facility], [place] [address] [place], [place] [address]    Report Status 07/23/2021 FINAL  Final   Organism ID, Bacteria MORGANELLA MORGANII  Final      Susceptibility   Morganella morganii - MIC*    AMPICILLIN >=32 RESISTANT Resistant     CEFAZOLIN >=64 RESISTANT Resistant     CEFTAZIDIME <=1 SENSITIVE Sensitive     CIPROFLOXACIN <=0.25 SENSITIVE Sensitive     GENTAMICIN <=1 SENSITIVE Sensitive     IMIPENEM 8 INTERMEDIATE  Intermediate     TRIMETH/SULFA <=20 SENSITIVE Sensitive     AMPICILLIN/SULBACTAM >=32 RESISTANT Resistant     PIP/TAZO <=4 SENSITIVE Sensitive     * MORGANELLA MORGANII  Culture, blood (Routine x 2)     Status: Abnormal   Collection Time: 07/18/21  3:07 PM   Specimen: BLOOD RIGHT FOREARM  Result Value Ref Range Status   Specimen Description BLOOD RIGHT FOREARM  Final   Special Requests   Final    BOTTLES DRAWN AEROBIC AND ANAEROBIC Blood Culture adequate volume   Culture  Setup Time   Final    GRAM NEGATIVE RODS ANAEROBIC BOTTLE ONLY IN BOTH AEROBIC AND ANAEROBIC BOTTLES CRITICAL RESULT CALLED TO, READ BACK BY AND VERIFIED WITH: PHARMD GREG ABBOTT 07/19/21'@6' :10 BY TW    Culture (A)  Final    ESCHERICHIA COLI MORGANELLA MORGANII SUSCEPTIBILITIES PERFORMED ON PREVIOUS CULTURE WITHIN THE LAST 5 DAYS. Performed at [facility], [place] [address] [place], [place] [address]    Report Status 07/23/2021 FINAL  Final   Organism ID, Bacteria ESCHERICHIA COLI  Final      Susceptibility   Escherichia coli - MIC*    AMPICILLIN 8 SENSITIVE Sensitive     CEFAZOLIN <=4 SENSITIVE Sensitive     CEFEPIME <=0.12 SENSITIVE Sensitive     CEFTAZIDIME <=1 SENSITIVE Sensitive     CEFTRIAXONE <=0.25 SENSITIVE Sensitive     CIPROFLOXACIN <=0.25 SENSITIVE Sensitive     GENTAMICIN <=1 SENSITIVE Sensitive     IMIPENEM <=0.25 SENSITIVE Sensitive     TRIMETH/SULFA <=20 SENSITIVE Sensitive     AMPICILLIN/SULBACTAM 4 SENSITIVE Sensitive     PIP/TAZO <=4 SENSITIVE Sensitive     * ESCHERICHIA COLI  Blood Culture ID Panel (Reflexed)     Status: Abnormal   Collection Time: 07/18/21  3:07 PM  Result Value Ref Range Status   Enterococcus faecalis NOT DETECTED NOT DETECTED Final   Enterococcus Faecium NOT DETECTED NOT DETECTED Final   Listeria monocytogenes NOT DETECTED NOT DETECTED Final   Staphylococcus species NOT DETECTED NOT DETECTED Final   Staphylococcus aureus (BCID) NOT DETECTED NOT DETECTED Final    Staphylococcus epidermidis NOT DETECTED NOT DETECTED Final   Staphylococcus lugdunensis NOT DETECTED NOT DETECTED Final   Streptococcus species NOT DETECTED NOT DETECTED Final   Streptococcus agalactiae NOT DETECTED NOT DETECTED Final   Streptococcus pneumoniae NOT DETECTED NOT DETECTED Final   Streptococcus pyogenes NOT DETECTED NOT DETECTED Final   A.calcoaceticus-baumannii NOT DETECTED NOT DETECTED Final   Bacteroides fragilis NOT DETECTED NOT DETECTED Final   Enterobacterales DETECTED (A) NOT DETECTED Final    Comment: Enterobacterales represent a large order of gram negative bacteria, not a single organism. CRITICAL RESULT CALLED TO, READ BACK BY AND VERIFIED WITH: PHARMD GREG ABBOTT 07/19/21'@6' :10 BY TW    Enterobacter cloacae complex NOT DETECTED NOT DETECTED Final   Escherichia coli DETECTED (A) NOT DETECTED Final    Comment: CRITICAL RESULT CALLED TO, READ BACK BY AND VERIFIED  WITH: PHARMD GREG ABBOTT 07/19/21'@6' :10 BY TW    Klebsiella aerogenes NOT DETECTED NOT DETECTED Final   Klebsiella oxytoca NOT DETECTED NOT DETECTED Final   Klebsiella pneumoniae NOT DETECTED NOT DETECTED Final   Proteus species NOT DETECTED NOT DETECTED Final   Salmonella species NOT DETECTED NOT DETECTED Final   Serratia marcescens NOT DETECTED NOT DETECTED Final   Haemophilus influenzae NOT DETECTED NOT DETECTED Final   Neisseria meningitidis NOT DETECTED NOT DETECTED Final   Pseudomonas aeruginosa NOT DETECTED NOT DETECTED Final   Stenotrophomonas maltophilia NOT DETECTED NOT DETECTED Final   Candida albicans NOT DETECTED NOT DETECTED Final   Candida auris NOT DETECTED NOT DETECTED Final   Candida glabrata NOT DETECTED NOT DETECTED Final   Candida krusei NOT DETECTED NOT DETECTED Final   Candida parapsilosis NOT DETECTED NOT DETECTED Final   Candida tropicalis NOT DETECTED NOT DETECTED Final   Cryptococcus neoformans/gattii NOT DETECTED NOT DETECTED Final   CTX-M ESBL NOT DETECTED NOT DETECTED Final    Carbapenem resistance IMP NOT DETECTED NOT DETECTED Final   Carbapenem resistance KPC NOT DETECTED NOT DETECTED Final   Carbapenem resistance NDM NOT DETECTED NOT DETECTED Final   Carbapenem resist OXA 48 LIKE NOT DETECTED NOT DETECTED Final   Carbapenem resistance VIM NOT DETECTED NOT DETECTED Final    Comment: Performed at [facility], [address], [place], [place] [address]  Urine Culture     Status: Abnormal   Collection Time: 07/18/21  3:26 PM   Specimen: In/Out Cath Urine  Result Value Ref Range Status   Specimen Description IN/OUT CATH URINE  Final   Special Requests   Final    NONE Performed at [facility], [place] [address], [place], [place] [address]    Culture (A)  Final    >=100,000 COLONIES/mL ESCHERICHIA COLI 20,000 COLONIES/mL ENTEROCOCCUS FAECIUM    Report Status 07/21/2021 FINAL  Final   Organism ID, Bacteria ESCHERICHIA COLI (A)  Final   Organism ID, Bacteria ENTEROCOCCUS FAECIUM (A)  Final      Susceptibility   Escherichia coli - MIC*    AMPICILLIN 8 SENSITIVE Sensitive     CEFAZOLIN <=4 SENSITIVE Sensitive     CEFEPIME <=0.12 SENSITIVE Sensitive     CEFTRIAXONE <=0.25 SENSITIVE Sensitive     CIPROFLOXACIN <=0.25 SENSITIVE Sensitive     GENTAMICIN <=1 SENSITIVE Sensitive     IMIPENEM <=0.25 SENSITIVE Sensitive     NITROFURANTOIN <=16 SENSITIVE Sensitive     TRIMETH/SULFA <=20 SENSITIVE Sensitive     AMPICILLIN/SULBACTAM <=2 SENSITIVE Sensitive     PIP/TAZO <=4 SENSITIVE Sensitive     * >=100,000 COLONIES/mL ESCHERICHIA COLI   Enterococcus faecium - MIC*    AMPICILLIN >=32 RESISTANT Resistant     LEVOFLOXACIN 2 SENSITIVE Sensitive     NITROFURANTOIN 64 INTERMEDIATE Intermediate     VANCOMYCIN <=0.5 SENSITIVE Sensitive     GENTAMICIN SYNERGY RESISTANT Resistant     * 20,000 COLONIES/mL ENTEROCOCCUS FAECIUM  Aerobic/Anaerobic Culture w Gram Stain (surgical/deep wound)     Status: None   Collection Time: 07/20/21 11:03 AM   Specimen:  Abscess  Result Value Ref Range Status   Specimen Description ABSCESS  Final   Special Requests GALLBLADDER  Final   Gram Stain   Final    ABUNDANT WBC PRESENT, PREDOMINANTLY PMN MODERATE GRAM NEGATIVE RODS FEW GRAM POSITIVE COCCI IN CHAINS RARE GRAM POSITIVE RODS    Culture   Final    ABUNDANT ESCHERICHIA COLI ABUNDANT  MORGANELLA MORGANII ABUNDANT BACTEROIDES OVATUS BETA LACTAMASE POSITIVE Performed at [facility], [place] [address] [place], [place] [address]    Report Status 07/23/2021 FINAL  Final   Organism ID, Bacteria ESCHERICHIA COLI  Final   Organism ID, Bacteria MORGANELLA MORGANII  Final      Susceptibility   Escherichia coli - MIC*    AMPICILLIN 8 SENSITIVE Sensitive     CEFAZOLIN <=4 SENSITIVE Sensitive     CEFEPIME <=0.12 SENSITIVE Sensitive     CEFTAZIDIME <=1 SENSITIVE Sensitive     CEFTRIAXONE <=0.25 SENSITIVE Sensitive     CIPROFLOXACIN <=0.25 SENSITIVE Sensitive     GENTAMICIN <=1 SENSITIVE Sensitive     IMIPENEM <=0.25 SENSITIVE Sensitive     TRIMETH/SULFA <=20 SENSITIVE Sensitive     AMPICILLIN/SULBACTAM <=2 SENSITIVE Sensitive     PIP/TAZO <=4 SENSITIVE Sensitive     * ABUNDANT ESCHERICHIA COLI   Morganella morganii - MIC*    AMPICILLIN >=32 RESISTANT Resistant     CEFAZOLIN >=64 RESISTANT Resistant     CEFTAZIDIME <=1 SENSITIVE Sensitive     CIPROFLOXACIN <=0.25 SENSITIVE Sensitive     GENTAMICIN <=1 SENSITIVE Sensitive     IMIPENEM 8 INTERMEDIATE Intermediate     TRIMETH/SULFA <=20 SENSITIVE Sensitive     AMPICILLIN/SULBACTAM >=32 RESISTANT Resistant     PIP/TAZO <=4 SENSITIVE Sensitive     * ABUNDANT MORGANELLA MORGANII  Body fluid culture w Gram Stain     Status: None   Collection Time: 07/24/21 11:23 AM   Specimen: Lung, Right; Pleural Fluid  Result Value Ref Range Status   Specimen Description PLEURAL FLUID  Final   Special Requests RIGHT LUNG  Final   Gram Stain   Final    RARE WBC PRESENT, PREDOMINANTLY PMN NO ORGANISMS SEEN     Culture   Final    NO GROWTH 3 DAYS Performed at [facility], [address] [place], [place] [address]    Report Status 07/27/2021 FINAL  Final  Aerobic/Anaerobic Culture w Gram Stain (surgical/deep wound)     Status: None   Collection Time: 07/25/21  9:18 AM   Specimen: Liver; Abscess  Result Value Ref Range Status   Specimen Description LIVER ABSCESS PERIHEPATIC FLUID COLLECTION  Final   Special Requests Normal  Final   Gram Stain   Final    WBC PRESENT,BOTH PMN AND MONONUCLEAR NO ORGANISMS SEEN CYTOSPIN SMEAR    Culture   Final    No growth aerobically or anaerobically. Performed at [facility], [place] [address], [place], [place] [address]    Report Status 07/30/2021 FINAL  Final   Imaging DG Chest 1 View  Result Date: 07/24/2021 CLINICAL DATA:  Status post right thoracentesis. EXAM: CHEST  1 VIEW COMPARISON:  Chest x-ray Jul 23, 2021. FINDINGS: Persistent small to moderate right pleural effusion. No visible pneumothorax on this semi-erect radiograph. Similar cardiomediastinal silhouette. Right IJ approach Port-A-Cath with the tip projecting at the superior right atrium, unchanged. IMPRESSION: No visible pneumothorax on this semi-erect radiograph. Small to moderate right pleural effusion. Electronically Signed   By: Margaretha Sheffield M.D.   On: 07/24/2021 11:25   CT Angio Chest Pulmonary Embolism (PE) W or WO Contrast  Result Date: 07/24/2021 CLINICAL DATA:  Pulmonary embolism on recent abdominal CT. EXAM: CT ANGIOGRAPHY CHEST WITH CONTRAST TECHNIQUE: Multidetector CT imaging of the chest was performed using the standard protocol during bolus administration of intravenous contrast. Multiplanar CT image reconstructions and MIPs were obtained  to evaluate the vascular anatomy. RADIATION DOSE REDUCTION: This exam was performed according to the departmental dose-optimization program which includes automated exposure control, adjustment of the mA and/or kV according to patient  size and/or use of iterative reconstruction technique. CONTRAST:  44m OMNIPAQUE IOHEXOL 350 MG/ML SOLN COMPARISON:  CT abdomen and pelvis 07/24/2021. CTA chest 07/18/2021. FINDINGS: Cardiovascular: Segmental and subsegmental pulmonary arterial emboli are present bilaterally, most notably in the left lower lobe (as seen on the recent abdominal CT) and in the right upper lobe, and with these being new from the 07/18/2021 chest CTA. There is thoracic aortic atherosclerosis without aneurysm. The heart is upper limits of normal in size. There is no pericardial effusion. Coronary atherosclerosis is noted. A right jugular Port-A-Cath terminates in the right atrium. Mediastinum/Nodes: No enlarged axillary, mediastinal, or hilar lymph nodes. Small sliding hiatal hernia. Lungs/Pleura: Moderate right and small left pleural effusions with associated compressive atelectasis in the right greater than left lower lobes. Grossly unchanged scattered bilateral lung nodules measuring up to 4-5 mm in size as detailed on the recent prior chest CTA. Upper Abdomen: Partially visualized subcapsular fluid collection along the lateral aspect of the right hepatic lobe, more fully evaluated on today's abdominal CT. Partially visualized biliary stent. Musculoskeletal: No acute osseous abnormality or suspicious osseous lesion. Review of the MIP images confirms the above findings. IMPRESSION: 1. Bilateral segmental and subsegmental pulmonary emboli. 2. Moderate right and small left pleural effusions with atelectasis in the lower lobes. 3. Partially visualized subcapsular hepatic fluid collection, more fully evaluated on today's abdominal CT. 4. Aortic Atherosclerosis (ICD10-I70.0). Electronically Signed   By: ALogan BoresM.D.   On: 07/24/2021 10:55   CT ABDOMEN PELVIS W CONTRAST  Result Date: 08/17/2021 CLINICAL DATA:  Abdominal pain, acute, nonlocalized EXAM: CT ABDOMEN AND PELVIS WITH CONTRAST TECHNIQUE: Multidetector CT imaging of the  abdomen and pelvis was performed using the standard protocol following bolus administration of intravenous contrast. RADIATION DOSE REDUCTION: This exam was performed according to the departmental dose-optimization program which includes automated exposure control, adjustment of the mA and/or kV according to patient size and/or use of iterative reconstruction technique. CONTRAST:  85mOMNIPAQUE IOHEXOL 300 MG/ML  SOLN COMPARISON:  08/15/2021 FINDINGS: Lower chest: Small bilateral pleural effusions. Compressive atelectasis in the lower lobes. Findings stable since prior study. Hepatobiliary: Right perihepatic drainage catheter remains in place with near complete resolution of perihepatic fluid collection. Inferior perihepatic fluid collection again noted, measuring 2.3 cm, stable. Numerous hypodensities throughout the liver are again seen and unchanged compatible with metastases. Cholecystostomy tube in place. Gallbladder decompressed. Gallstone noted within the gallbladder. Common bile duct stent noted, unchanged. Pancreas: Ill-defined hypodensity in the body of the pancreas compatible with known pancreatic malignancy. Pancreatic tail atrophy. Spleen: No focal abnormality.  Normal size. Adrenals/Urinary Tract: Right upper pole renal cyst. No hydronephrosis. Adrenal glands and urinary bladder unremarkable. Stomach/Bowel: Large stool burden in the colon. Sigmoid diverticulosis. Stomach and small bowel decompressed. Vascular/Lymphatic: Aortoiliac atherosclerosis. No adenopathy or aneurysm. Reproductive: Prior hysterectomy.  No adnexal masses. Other: Moderate free fluid in the pelvis, unchanged. Musculoskeletal: No acute bony abnormality. IMPRESSION: Essentially stable CT since 2 days prior. Small bilateral pleural effusions with compressive atelectasis. Numerous metastases within the liver, stable. Perihepatic fluid collections are stable. Drainage catheter within a right perihepatic fluid collection and in the  gallbladder. Pancreatic body mass compatible with known pancreatic malignancy. Pancreatic tail atrophy. Large stool burden in the colon. Aortic atherosclerosis. Moderate free fluid in the pelvis. Electronically Signed  By: Rolm Baptise M.D.   On: 08/17/2021 02:22   CT ABDOMEN PELVIS W CONTRAST  Result Date: 08/15/2021 CLINICAL DATA:  Briefly, 76 year old female with a history of pancreatic tumor, perihepatic fluid collection and acute cholecystitis s/p perihepatic drain and cholecystostomy drain placement. EXAM: CT ABDOMEN AND PELVIS WITH CONTRAST TECHNIQUE: Multidetector CT imaging of the abdomen and pelvis was performed using the standard protocol following bolus administration of intravenous contrast. RADIATION DOSE REDUCTION: This exam was performed according to the departmental dose-optimization program which includes automated exposure control, adjustment of the mA and/or kV according to patient size and/or use of iterative reconstruction technique. CONTRAST:  151m ISOVUE-300 IOPAMIDOL (ISOVUE-300) INJECTION 61% COMPARISON:  IR fluoroscopy, earlier same day.  CT AP, 07/24/2021. FINDINGS: Lower chest: Persistent small volume bilateral pleural effusions, RIGHT-greater-than LEFT, and adjacent dependent consolidations Hepatobiliary: *Well-positioned percutaneous drainage catheter with near-complete resolution of previously large RIGHT perihepatic fluid collection. *Trace residual collection along the hepatic dome measures up to 5.0 x 1.0 cm, previously 17 cm. *Additional inferomedial perihepatic fluid collection measures 3.0 x 1.7 cm, previously 6 cm *Multifocal hypodense lesions within the LEFT and RIGHT hepatic lobes, largest at hepatic segment VI measuring 3.5 x 2.5 cm, suspicious for hepatic metastases. *Stable positioning of cholecystostomy drain with contracted gallbladder. Metallic biliary stent. No intra or extrahepatic biliary ductal dilation. Pancreas: Normal contrast enhancement of the pancreatic  head. Similar ill-defined hypodensity within the body of pancreas and pancreatic tail atrophy. Findings compatible with known pancreatic malignancy Spleen: New area of hypodensity within the inferior splenic pole, in an area measuring 3.0 x 2.0 cm. Adrenals/Urinary Tract: Adrenal glands are unremarkable. RIGHT exophytic and renal cortical renal cysts, unchanged. The LEFT kidney is normal. No renal calculi or hydronephrosis. Bladder is unremarkable. Stomach/Bowel: Small hiatus hernia. Nonobstructed bowel. Severe burden of sigmoid diverticulosis. No evidence of bowel wall thickening, distention, or inflammatory changes. Vascular/Lymphatic: Aortic atherosclerosis without aneurysmal dilation. No enlarged abdominal or pelvic lymph nodes. Reproductive: Status post hysterectomy. No adnexal mass. Other: Small volume of residual ascites within the dependent pelvis. Musculoskeletal: Mild body wall edema. Multilevel spinal degenerative change. No interval osseous abnormality. IMPRESSION: Since CT AP dated 07/24/2021; 1. Well-positioned percutaneous drainage catheter with near-complete resolution of previously large perihepatic free fluid collection. Small volume residual inferomedial hepatic fluid collection. 2. Stable positioning of cholecystostomy drain with contracted gallbladder. 3. Metallic biliary stent without intra- or extrahepatic biliary ductal dilation. 4. Pancreatic body mass consistent with known malignancy. Increased conspicuity of multifocal hepatic lesions, as above, is suspicious for metastases. 5. New hypodensity within the inferior splenic pole, suspicious for regional hypoperfusion and splenic infarct. 6. Bilateral small volume pleural effusions, greater on RIGHT. Additional incidental, chronic and senescent findings as above. These results will be called to the ordering clinician or representative by the Radiologist Assistant, and communication documented in the PACS or [employer] Electronically  Signed   By: JMichaelle BirksM.D.   On: 08/15/2021 17:41   CT ABDOMEN PELVIS W CONTRAST  Result Date: 07/24/2021 CLINICAL DATA:  Nausea, vomiting EXAM: CT ABDOMEN AND PELVIS WITH CONTRAST TECHNIQUE: Multidetector CT imaging of the abdomen and pelvis was performed using the standard protocol following bolus administration of intravenous contrast. RADIATION DOSE REDUCTION: This exam was performed according to the departmental dose-optimization program which includes automated exposure control, adjustment of the mA and/or kV according to patient size and/or use of iterative reconstruction technique. CONTRAST:  1024mOMNIPAQUE IOHEXOL 300 MG/ML  SOLN COMPARISON:  07/18/2021 FINDINGS: Lower chest: Moderate right pleural  effusion. Compressive atelectasis in the right lower lobe. This is new since prior study. Trace left pleural effusion. Concern for filling defect within the left lower lobe pulmonary artery and pulmonary embolus. Coronary artery and aortic calcifications. Hepatobiliary: Cholecystostomy tube within the gallbladder which is decompressed. New subcapsular fluid collections adjacent to the right lobe of the liver. The largest is noted anteriorly and laterally measuring up to 17 cm. Posterior subcapsular fluid collection measures up to 6.2 cm. These appear to communicate inferiorly. Metallic biliary stent again noted, unchanged. Pneumobilia again noted. Pancreas: Large hypodense area again seen in the body of the pancreas with atrophy of the pancreatic tail. Findings compatible with history of pancreatic cancer. Findings stable since recent study. Spleen: No focal abnormality.  Normal size. Adrenals/Urinary Tract: Stable right upper pole renal cyst and midpole renal cyst on the right. No hydronephrosis. Adrenal glands and urinary bladder unremarkable. Stomach/Bowel: Sigmoid diverticulosis. No diverticulitis. Stomach and small bowel decompressed, grossly unremarkable. Vascular/Lymphatic: Diffuse aortic  atherosclerosis. No evidence of aneurysm or adenopathy. Reproductive: Prior hysterectomy.  No adnexal masses. Other: Moderate free fluid in the pelvis.  No free air. Musculoskeletal: No acute bony abnormality. IMPRESSION: Interval placement of cholecystostomy tube with decompression of the gallbladder since prior study. New subcapsular fluid collection adjacent to the liver, the largest component measuring up to 17.6 cm. Stable pancreatic body mass. Moderate free fluid in the pelvis. Moderate-sized right pleural effusion with compressive atelectasis in the right lower lobe. Trace left pleural effusion. Incidentally noted pulmonary embolus within the left lower lobe pulmonary artery. This could be fully evaluated with CTA chest. Coronary artery disease, aortic atherosclerosis. Electronically Signed   By: Rolm Baptise M.D.   On: 07/24/2021 03:17   DG Sinus/Fist Tube Chk-Non GI  Result Date: 08/15/2021 CLINICAL DATA:  Briefly, 76 year old female with a history of pancreatic tumor, perihepatic fluid collection and acute cholecystitis s/p perihepatic drain and cholecystostomy drain placement. EXAM: CHOLECYSTOSTOMY DRAIN INJECTION COMPARISON:  CT AP, concurrent and 07/24/2021.  IR CT, 07/25/2021. CONTRAST:  40 mL Omnipaque 300-administered via the existing cholecystostomy drain. FLUOROSCOPY TIME:  Fluoroscopic dose; 11.3 mGy TECHNIQUE: The patient was positioned supine on the fluoroscopy table. A preprocedural spot fluoroscopic image was obtained of the RIGHT upper quadrant and the existing percutaneous cholecystostomy drainage catheter. Multiple spot fluoroscopic and radiographic images were obtained following the injection of a small amount of contrast via the existing percutaneous cholecystostomy catheter. A contrast injection for the perihepatic drainage catheter was not performed. Procedure performed by Pasty Spillers, IR PA FINDINGS: Cholecystostomy drain injection without evidence of cystic duct patency.  Metallic biliary stent. IMPRESSION: No fluoroscopic evidence of cystic duct patency. The cholecystostomy drain was therefore LEFT in place. Electronically Signed   By: Michaelle Birks M.D.   On: 08/15/2021 16:41   DG CHEST PORT 1 VIEW  Result Date: 07/27/2021 CLINICAL DATA:  Shortness of breath, chest congestion EXAM: PORTABLE CHEST 1 VIEW COMPARISON:  Chest radiograph 07/24/2021 FINDINGS: The right chest wall port is stable in position. There is a new pigtail catheter in the right upper quadrant. The cardiomediastinal silhouette is stable. The right pleural effusion has decreased in size, with improved aeration of the right lower lobe following thoracentesis. Residual patchy opacities in the perihilar region may reflect atelectasis. There is no appreciable pneumothorax. The left lung is clear. There is no significant left effusion. There is no left pneumothorax. The bones are stable. IMPRESSION: Decreased size of the right pleural effusion with improved aeration of the right base following  thoracentesis. No appreciable pneumothorax. Electronically Signed   By: Valetta Mole M.D.   On: 07/27/2021 09:46   DG Chest Port 1 View  Result Date: 07/23/2021 CLINICAL DATA:  Leukocytosis EXAM: PORTABLE CHEST 1 VIEW COMPARISON:  07/18/2021 FINDINGS: Right Port-A-Cath remains in place, unchanged. Cardiomegaly. Bilateral effusions and lower lobe airspace opacities. No acute bony abnormality. Aortic atherosclerosis. IMPRESSION: Bilateral effusions with lower lobe atelectasis or pneumonia. Electronically Signed   By: Rolm Baptise M.D.   On: 07/23/2021 19:11   DG Abd 2 Views  Result Date: 07/22/2021 CLINICAL DATA:  Abdominal pain.  Pancreatic cancer. EXAM: ABDOMEN - 2 VIEW COMPARISON:  None Available. FINDINGS: A cover stent is identified in the mid abdomen, probably in the common bile duct. The recently placed cholecystostomy tube is projected in the right upper quadrant. Air-filled nondilated loops of bowel are identified.  Mild ileus not excluded. No evidence of obstruction. IMPRESSION: 1. A stent is identified in the mid abdomen, likely in the common bile duct. 2. The cholecystostomy tube is identified. 3. Air-filled mildly prominent but nondilated loops of large and small bowel could represent mild ileus. No evidence of obstruction. Electronically Signed   By: Dorise Bullion III M.D.   On: 07/22/2021 19:29   CT IMAGE GUIDED DRAINAGE BY PERCUTANEOUS CATHETER  Result Date: 07/25/2021 CLINICAL DATA:  History of pancreatic carcinoma and development large right-sided perihepatic/subcapsular fluid collection. EXAM: CT GUIDED CATHETER DRAINAGE OF PERIHEPATIC ABSCESS ANESTHESIA/SEDATION: Moderate (conscious) sedation was employed during this procedure. A total of Versed 1.0 mg and Fentanyl 75 mcg was administered intravenously by radiology nursing. Moderate Sedation Time: 18 minutes. The patient's level of consciousness and vital signs were monitored continuously by radiology nursing throughout the procedure under my direct supervision. PROCEDURE: The procedure, risks, benefits, and alternatives were explained to the patient. Questions regarding the procedure were encouraged and answered. The patient understands and consents to the procedure. A time out was performed prior to initiating the procedure. CT was performed through the upper abdomen in a supine position. The abdominal wall was prepped with chlorhexidine in a sterile fashion, and a sterile drape was applied covering the operative field. A sterile gown and sterile gloves were used for the procedure. Local anesthesia was provided with 1% Lidocaine. An 18 gauge trocar needle was advanced into a right lateral perihepatic/subcapsular fluid collection. After confirming needle tip position, a guidewire was advanced into the collection. The needle was removed and the tract dilated over the wire. A 12 French percutaneous drainage catheter was advanced over the wire. Catheter position  was confirmed by CT. A fluid sample was withdrawn and sent for culture analysis. The catheter was attached to suction bulb drainage and secured at the skin with a Prolene retention suture and adhesive StatLock device. COMPLICATIONS: None FINDINGS: Large right-sided perihepatic fluid collection again noted. Aspiration yielded clear yellow fluid. After drainage catheter placement there is abundant return of fluid. IMPRESSION: CT-guided percutaneous catheter drainage of large right lateral perihepatic/subcapsular fluid collection. A sample of clear yellow fluid was sent for culture analysis. A 12 French drainage catheter was placed and attached to suction bulb drainage. RADIATION DOSE REDUCTION: This exam was performed according to the departmental dose-optimization program which includes automated exposure control, adjustment of the mA and/or kV according to patient size and/or use of iterative reconstruction technique. Electronically Signed   By: Aletta Edouard M.D.   On: 07/25/2021 11:43   VAS [place] LOWER EXTREMITY VENOUS (DVT)  Result Date: 07/24/2021  Lower Venous DVT Study  Patient Name:  JASLEEN RIEPE  Date of Exam:   07/24/2021 Medical Rec #: [id number]          Accession #:    [id number] Date of Birth: Jul 12, 1945          Patient Gender: F Patient Age:   76 years Exam Location:  [facility] Procedure:      VAS [place] LOWER EXTREMITY VENOUS (DVT) Referring Phys: Hosie Poisson --------------------------------------------------------------------------------  Indications: Pulmonary embolism.  Risk Factors: Cancer pancreatic - on chemotherapy. Limitations: Compressions contraindicated in presence of proximal obstruction. Comparison Study: No prior venous studies. CTA chest 07-24-2021 positive for                   pulmonary embolism. Performing Technologist: Darlin Coco RDMS, RVT  Examination Guidelines: A complete evaluation includes B-mode imaging, spectral Doppler, color Doppler, and power Doppler as needed  of all accessible portions of each vessel. Bilateral testing is considered an integral part of a complete examination. Limited examinations for reoccurring indications may be performed as noted. The reflux portion of the exam is performed with the patient in reverse Trendelenburg.  +---------+---------------+---------+-----------+----------+--------------+ RIGHT    CompressibilityPhasicitySpontaneityPropertiesThrombus Aging +---------+---------------+---------+-----------+----------+--------------+ CFV      Partial        Yes      Yes                  Acute          +---------+---------------+---------+-----------+----------+--------------+ SFJ      Full                                                        +---------+---------------+---------+-----------+----------+--------------+ FV Prox                 Yes      Yes                                 +---------+---------------+---------+-----------+----------+--------------+ FV Mid                  Yes      Yes                                 +---------+---------------+---------+-----------+----------+--------------+ FV Distal               Yes      Yes                                 +---------+---------------+---------+-----------+----------+--------------+ PFV                     Yes      Yes                                 +---------+---------------+---------+-----------+----------+--------------+ POP      Full           Yes      Yes                                 +---------+---------------+---------+-----------+----------+--------------+  PTV                     Yes      Yes                                 +---------+---------------+---------+-----------+----------+--------------+ PERO                    Yes      Yes                                 +---------+---------------+---------+-----------+----------+--------------+    +---------+---------------+---------+-----------+----------+--------------+ LEFT     CompressibilityPhasicitySpontaneityPropertiesThrombus Aging +---------+---------------+---------+-----------+----------+--------------+ CFV      Partial        Yes      Yes                  Acute          +---------+---------------+---------+-----------+----------+--------------+ SFJ      Full                                                        +---------+---------------+---------+-----------+----------+--------------+ FV Prox  Full                                                        +---------+---------------+---------+-----------+----------+--------------+ FV Mid                  Yes      Yes                                 +---------+---------------+---------+-----------+----------+--------------+ FV Distal               Yes      Yes                                 +---------+---------------+---------+-----------+----------+--------------+ PFV      Partial        Yes      Yes                  Acute          +---------+---------------+---------+-----------+----------+--------------+ POP      Full           Yes      Yes                                 +---------+---------------+---------+-----------+----------+--------------+ PTV                     Yes      Yes                                 +---------+---------------+---------+-----------+----------+--------------+ PERO  Yes      Yes                                 +---------+---------------+---------+-----------+----------+--------------+     Summary: RIGHT: - Findings consistent with acute deep vein thrombosis involving the right common femoral vein. - No cystic structure found in the popliteal fossa.  - Common femoral vein obstruction doesn't appear to extend above inguinal ligament.  LEFT: - Findings consistent with acute deep vein thrombosis involving the left common femoral vein,  and left proximal profunda vein. - No cystic structure found in the popliteal fossa.  - The common femoral vein obstruction does not appear to extend above inguinal ligament.  *See table(s) above for measurements and observations. Electronically signed by Deitra Mayo MD on 07/24/2021 at 3:12:46 PM.    Final    IR Radiologist Eval & Mgmt  Result Date: 08/15/2021 Please refer to notes tab for details about interventional procedure. (Op Note)  IR THORACENTESIS ASP PLEURAL SPACE W/IMG GUIDE  Result Date: 07/24/2021 INDICATION: Patient with a history of metastatic pancreatic cancer presents today with a right pleural effusion. Interventional radiology asked to perform a diagnostic and therapeutic thoracentesis. EXAM: ULTRASOUND GUIDED THORACENTESIS MEDICATIONS: 1% lidocaine 10 mL COMPLICATIONS: None immediate. PROCEDURE: An ultrasound guided thoracentesis was thoroughly discussed with the patient and questions answered. The benefits, risks, alternatives and complications were also discussed. The patient understands and wishes to proceed with the procedure. Written consent was obtained. Ultrasound was performed to localize and mark an adequate pocket of fluid in the right chest. The area was then prepped and draped in the normal sterile fashion. 1% Lidocaine was used for local anesthesia. Under ultrasound guidance a 6 Fr Safe-T-Centesis catheter was introduced. Thoracentesis was performed. The catheter was removed and a dressing applied. FINDINGS: A total of approximately 350 mL of clear yellow fluid was removed. Samples were sent to the laboratory as requested by the clinical team. IMPRESSION: Successful ultrasound guided right thoracentesis yielding 350 mL of pleural fluid. Read by: Soyla Dryer, NP Electronically Signed   By: Corrie Mckusick D.O.   On: 07/24/2021 12:11    Problem List Items Addressed This Visit       Other   Medication monitoring encounter   Perihepatic abscess ([place])   Relevant  Orders   CBC (Completed)   Basic metabolic panel   C-reactive protein   Sedimentation rate   PICC (peripherally inserted central catheter) in place   Poor appetite - Primary    Assessment/Plan # Subcapsular fluid collection adjacent to the liver.  17.6 cm # Acute cholecystitis S/p cholecystostomy by IR 5/5. Cx with Morganella morganii/B ovatus and E coli  # Morganella morganii/E coli  bacteremia - S/p CT guided drainage of perihepatic fluid 5/10. Cx no growth - CT 6/1 Right perihepatic drainage catheter remains in place with near complete resolution of perihepatic fluid collection. Inferior perihepatic fluid collection again noted, measuring 2.3 cm, stable - Continue IV cefepime and po metronidazole for now while working on direct admission given poor po intake and nausea/vomiting. PO Bactrim vs PO Ciprofloxacin considered for potential po option but not a good option currently - Tentative end date is until next appt with Dr Juleen China 08/29/21 in case she does not find a bed by then. I am away from clinic that time.  - Will notify IP consult service for hospital admission  # Poor PO intake/Nausea/Vomiting - Concerns for medication related (  metronidazole ) vs malignancy related decline - Will plan to directly admit the patient for IVF, will consider stopping metronidazole    # Pancreatic ca on chemotherapy  - Oncology following    # DVT of lower extremities and PE  - ON AC  # PICC/Port-a-cath in place - no issues   Note : I spoke with Dr Fuller Plan [facility]) who has accepted the patient as a direct admit.   I have personally spent 42 minutes involved in face-to-face and non-face-to-face activities for this patient on the day of the visit inclduing counseling of the patient and coordination of care for arranging direct hospital admission.   Wilber Oliphant, [place] for [facility] 08/20/2021, 10:53 AM

## 2021-08-20 NOTE — Assessment & Plan Note (Addendum)
Status post cholecystostomy tube insertion In addition, had perihepatic drain placed Saw IR on 5/31 and plan for capping trial with return for imaging in 2 weeks Her symptoms seem to have worsened after this trial, unclear if this is related? CT 08/16/21 with stable CT, small bilateral effusions, numerous mets within liver, drainage catheter within right perihepatic fluid collection and in the gallbladder, pancreatic body mass c/w known pancreatic malignancy, large stool burden IR consulted to see about reattaching bags. With E. coli and Morganella morganii bacteremia Was on antibiotic therapy with cefepime and Flagyl. Patient with acute metabolic encephalopathy on 08/23/2021 and noted to be pocketing pills and also refusing some of her medications and as such oral Flagyl changed to IV Flagyl.  -Also due to concerns that cefepime may be contributing to metabolic encephalopathy cefepime changed to IV ciprofloxacin per ID.   -Patient transitioned to oral ciprofloxacin and oral Flagyl per ID. ID following and appreciate input and recommendations. -General surgery wrote a note that patient no longer a candidate for systemic chemotherapy due to poor status, due to patient's functional status, poor prognosis per general surgery not a candidate for surgical intervention for the gallbladder and will need to keep draining at this point indefinitely.  General surgery deferred to IR for further management. -Oral antibiotics discontinued per ID today as patient has received adequate antibiotic treatment of approximately 5 weeks per ID note.

## 2021-08-20 NOTE — Telephone Encounter (Signed)
Per Dr. West Bali, patient to be directly admitted to [facility] for intractable nausea/vomiting and inability to tolerate PO intake.   Admitting MD: [place]  Spoke with Margot at patient placement. Provided her with patient's daughter's phone number to call when bed becomes available [phone]).   Instructed patient's daughter that they will need to answer bed control's call and follow instructions for admission.   Beryle Flock, RN

## 2021-08-20 NOTE — Progress Notes (Signed)
Labs drawn via PICC line per Dr. West Bali. Line flushed with 10 mL normal saline and clamped. Patient tolerated well.   Beryle Flock, RN

## 2021-08-20 NOTE — Assessment & Plan Note (Addendum)
-  IV fluids held initially due to concern for mild swelling early on in the hospitalization however patient placed back on IV fluids due to poor oral intake, dehydration and worsening renal function..   -Encourage oral intake.   -Patient started to develop some volume overload with some lower extremity edema likely secondary to third spacing. -Saline lock IV fluids. -Follow.

## 2021-08-20 NOTE — Assessment & Plan Note (Addendum)
Stable. -Synthroid. Outpatient follow-up.

## 2021-08-20 NOTE — Assessment & Plan Note (Addendum)
Last chemotherapy was on 06/11/21 Follow-up with oncology as an outpatient upon discharge Patient seen and being followed by oncology, Dr. Benay Spice during this hospitalization. -Hospice recommended per oncology. -Oncology spoke to patient this morning and also spoke with patient's daughter and decision made to discharge home with hospice following.  -TOC has consulted hospice and hopefully patient may discharge home with hospice tomorrow.  -Oncology following.

## 2021-08-20 NOTE — Assessment & Plan Note (Addendum)
-  Controlled on current regimen of amlodipine.   -Patient with altered mental status drowsy with poor oral intake and pocketing medications. -Mental status improved.  -Amlodipine resumed.   -Follow.

## 2021-08-21 ENCOUNTER — Inpatient Hospital Stay (HOSPITAL_COMMUNITY): Payer: Medicare HMO

## 2021-08-21 ENCOUNTER — Encounter: Payer: Self-pay | Admitting: Oncology

## 2021-08-21 DIAGNOSIS — C259 Malignant neoplasm of pancreas, unspecified: Secondary | ICD-10-CM

## 2021-08-21 DIAGNOSIS — N179 Acute kidney failure, unspecified: Secondary | ICD-10-CM

## 2021-08-21 DIAGNOSIS — E860 Dehydration: Secondary | ICD-10-CM | POA: Diagnosis not present

## 2021-08-21 DIAGNOSIS — E871 Hypo-osmolality and hyponatremia: Secondary | ICD-10-CM | POA: Diagnosis not present

## 2021-08-21 DIAGNOSIS — Z515 Encounter for palliative care: Secondary | ICD-10-CM

## 2021-08-21 LAB — COMPREHENSIVE METABOLIC PANEL
ALT: 13 U/L (ref 0–44)
AST: 34 U/L (ref 15–41)
Albumin: <1.5 g/dL — ABNORMAL LOW (ref 3.5–5.0)
Alkaline Phosphatase: 340 U/L — ABNORMAL HIGH (ref 38–126)
Anion gap: 7 (ref 5–15)
BUN: 35 mg/dL — ABNORMAL HIGH (ref 8–23)
CO2: 19 mmol/L — ABNORMAL LOW (ref 22–32)
Calcium: 8.1 mg/dL — ABNORMAL LOW (ref 8.9–10.3)
Chloride: 108 mmol/L (ref 98–111)
Creatinine, Ser: 1.13 mg/dL — ABNORMAL HIGH (ref 0.44–1.00)
GFR, Estimated: 50 mL/min — ABNORMAL LOW (ref >60)
Glucose, Bld: 116 mg/dL — ABNORMAL HIGH (ref 70–99)
Potassium: 3.9 mmol/L (ref 3.5–5.1)
Sodium: 134 mmol/L — ABNORMAL LOW (ref 135–145)
Total Bilirubin: 0.6 mg/dL (ref 0.3–1.2)
Total Protein: 5.3 g/dL — ABNORMAL LOW (ref 6.5–8.1)

## 2021-08-21 LAB — BASIC METABOLIC PANEL
BUN/Creatinine Ratio: 30 (calc) — ABNORMAL HIGH (ref 6–22)
BUN: 31 mg/dL — ABNORMAL HIGH (ref 7–25)
CO2: 19 mmol/L — ABNORMAL LOW (ref 20–32)
Calcium: 8.6 mg/dL (ref 8.6–10.4)
Chloride: 104 mmol/L (ref 98–110)
Creat: 1.05 mg/dL — ABNORMAL HIGH (ref 0.60–1.00)
Glucose, Bld: 153 mg/dL — ABNORMAL HIGH (ref 65–99)
Potassium: 4 mmol/L (ref 3.5–5.3)
Sodium: 133 mmol/L — ABNORMAL LOW (ref 135–146)

## 2021-08-21 LAB — CBC
HCT: 40 % (ref 35.0–45.0)
HCT: 32.6 % — ABNORMAL LOW (ref 36.0–46.0)
Hemoglobin: 13.1 g/dL (ref 11.7–15.5)
Hemoglobin: 10.7 g/dL — ABNORMAL LOW (ref 12.0–15.0)
MCH: 31 pg (ref 27.0–33.0)
MCH: 31.8 pg (ref 26.0–34.0)
MCHC: 32.8 g/dL (ref 32.0–36.0)
MCHC: 32.8 g/dL (ref 30.0–36.0)
MCV: 94.6 fL (ref 80.0–100.0)
MCV: 96.7 fL (ref 80.0–100.0)
MPV: 9.3 fL (ref 7.5–12.5)
Platelets: 272 10*3/uL (ref 140–400)
Platelets: 160 10*3/uL (ref 150–400)
RBC: 4.23 10*6/uL (ref 3.80–5.10)
RBC: 3.37 MIL/uL — ABNORMAL LOW (ref 3.87–5.11)
RDW: 16.6 % — ABNORMAL HIGH (ref 11.0–15.0)
RDW: 18.5 % — ABNORMAL HIGH (ref 11.5–15.5)
WBC: 12.5 10*3/uL — ABNORMAL HIGH (ref 3.8–10.8)
WBC: 8.4 10*3/uL (ref 4.0–10.5)
nRBC: 0 % (ref 0.0–0.2)

## 2021-08-21 LAB — SEDIMENTATION RATE: Sed Rate: 6 mm/h (ref 0–30)

## 2021-08-21 LAB — C-REACTIVE PROTEIN: CRP: 21.9 mg/L — ABNORMAL HIGH (ref <8.0)

## 2021-08-21 MED ORDER — SODIUM CHLORIDE 0.9% FLUSH
10.0000 mL | INTRAVENOUS | Status: DC | PRN
  Administered 2021-08-21 – 2021-08-30 (×3): 10 mL

## 2021-08-21 MED ORDER — IOHEXOL 300 MG/ML  SOLN
100.0000 mL | Freq: Once | INTRAMUSCULAR | Status: AC | PRN
  Administered 2021-08-21: 100 mL via INTRAVENOUS

## 2021-08-21 MED ORDER — IOHEXOL 300 MG/ML  SOLN
100.0000 mL | Freq: Once | INTRAMUSCULAR | Status: DC | PRN
Start: 2021-08-21 — End: 2021-08-31

## 2021-08-21 NOTE — Plan of Care (Signed)
  Problem: Clinical Measurements: Goal: Will remain free from infection Outcome: Progressing   Problem: Clinical Measurements: Goal: Diagnostic test results will improve Outcome: Progressing   Problem: Health Behavior/Discharge Planning: Goal: Ability to manage health-related needs will improve Outcome: Progressing   Problem: Activity: Goal: Risk for activity intolerance will decrease Outcome: Progressing   Problem: Nutrition: Goal: Adequate nutrition will be maintained Outcome: Progressing   Problem: Coping: Goal: Level of anxiety will decrease Outcome: Progressing

## 2021-08-21 NOTE — Progress Notes (Signed)
MD notified that patients daughter would like to speak with him.

## 2021-08-21 NOTE — Progress Notes (Signed)
PROGRESS NOTE    [name]  QQI:[id number] DOB: 05-29-1945 DOA: 08/20/2021 PCP: [name], MD  No chief complaint on file.   Brief Narrative:  [name] is [name] 76 y.o. female with medical history significant for acute cholecystitis status post cholecystostomy, history of pancreatic cancer, hypothyroidism, hypertension, generalized anxiety disorder, history of venous thromboembolic disease, recent hospitalization for sepsis with Morganella morganii/E. coli bacteremia who was sent to the hospital as [name] direct admission for evaluation of poor oral intake and severe dehydration. Patient's daughter states that since her discharge her oral intake has been very poor but over the last several days she has not had anything to eat or drink due to persistent nausea and vomiting despite taking antiemetics as well as poor appetite.  She complains of feeling very weak and tired and has been unable to get out of bed for the last 2 days.  She feels her legs are very heavy and unable to bear her weight.  She denies having any falls. She complains of abdominal pain mostly in the left lumbar area which she rates Mykelle Cockerell 5 x 10 in intensity at its worst.  But denies having any changes in her bowel habits.  She denies having any fever, no chills, no urinary symptoms, no dizziness, no lightheadedness, no chest pain, no shortness of breath, no headache, no blurred vision or focal deficit. Her daughter is concerned about her progressive decline and inability to participate in physical therapy due to weakness.    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 CT 08/16/21 with stable CT, small bilateral effusions, numerous mets within liver, drainage catheter within right perihepatic fluid collection and in the gallbladder, pancreatic body mass c/w known pancreatic malignancy, large stool burden Will c/s IR and see about reattach bags With E. coli and Morganella morganii bacteremia Continue antibiotic therapy with cefepime and Flagyl Appreciate ID assistance  AKI (acute kidney injury) ([place]) Holding further IVF at this time given mild swelling noted after IVF Right hand with ring stuck on ring finger, discussed with RN to help with removal Renal US -> without hydro Will monitor for now Encourage PO  Weakness Secondary to poor oral intake as well as multiple comorbid conditions. Possibly related to capping trial? 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 Benay Spice of admission, he'll be by Thursday if she's still here  DVT prophylaxis: eliquis Code Status: dnr Family Communication: husband Disposition:   Status is: Inpatient Remains inpatient appropriate because:  continued nausea   Consultants:  ID IR  Procedures:  none  Antimicrobials:  Anti-infectives (From admission, onward)    Start     Dose/Rate Route Frequency Ordered Stop   08/21/21 0030  ceFEPIme (MAXIPIME) 2 g in sodium chloride 0.9 % 100 mL IVPB        2 g 200 mL/hr over 30 Minutes Intravenous Every 12 hours 08/20/21 2333     08/20/21 2330  metroNIDAZOLE (FLAGYL) tablet 500 mg        500 mg Oral 2 times daily 08/20/21 2304         Subjective: No new complaints Asking for tomato sandwich Asking for nausea medicine, pain medicine (husband)  Objective: Vitals:   08/20/21 2200 08/21/21 0554 08/21/21 0753 08/21/21 1703  BP: 132/90 138/84 129/87 132/81  Pulse: 87 90 85 91  Resp: '16 15 16 16  '$ Temp: (!) 97.5 F (36.4 C)  98.2 F (36.8 C) (!) 97.5 F (36.4 C)  TempSrc: Oral  Oral Oral  SpO2: 100% 100% 98% 98%  Weight: 72.8 kg     Height: '5\' 3"'$  (1.6 m)       Intake/Output Summary (Last 24 hours) at 08/21/2021 1718 Last data filed at 08/21/2021 1500 Gross per 24 hour  Intake 1530.67 ml  Output 0 ml  Net 1530.67 ml   Filed Weights   08/20/21 2200  Weight: 72.8 kg    Examination:  General exam: Appears calm and comfortable  Respiratory system: unlabored Cardiovascular system: RRR Gastrointestinal system: mild abdominal discomfort -2 drains present, capped Central nervous system: Alert and oriented. No focal neurological deficits. Extremities: mild LE edema   Data Reviewed: I have personally reviewed following labs and imaging studies  CBC: Recent Labs  Lab 08/16/21 2228 08/20/21 1122 08/21/21 0317  WBC 9.5 12.5* 8.4  NEUTROABS 6.0  --   --   HGB 11.7* 13.1 10.7*  HCT 37.1 40.0 32.6*  MCV 98.7 94.6 96.7  PLT 161 272 629    Basic Metabolic Panel: Recent Labs  Lab 08/16/21 2228 08/20/21 1122 08/21/21 0317  NA 133* 133* 134*  K 3.8 4.0 3.9  CL 106 104 108  CO2 21* 19* 19*  GLUCOSE 134* 153* 116*  BUN 16 31* 35*  CREATININE 0.69 1.05* 1.13*   CALCIUM 8.3* 8.6 8.1*    GFR: Estimated Creatinine Clearance: 39.3 mL/min (Jaquise Faux) (by C-G formula based on SCr of 1.13 mg/dL (H)).  Liver Function Tests: Recent Labs  Lab 08/16/21 2228 08/21/21 0317  AST 31 34  ALT 11 13  ALKPHOS 267* 340*  BILITOT 0.5 0.6  PROT 5.7* 5.3*  ALBUMIN 1.7* <1.5*    CBG: No results for input(s): GLUCAP in the last 168 hours.   No results found for this or any previous visit (from the past 240 hour(s)).       Radiology Studies: US RENAL  Result Date: 08/21/2021 CLINICAL DATA:  Acute kidney injury EXAM: RENAL / URINARY TRACT ULTRASOUND COMPLETE COMPARISON:  CT abdomen 08/16/2021 FINDINGS: Right Kidney: Renal measurements: 9.5 by 4.0 by 5.0 cm = volume: 102 mL. Mildly hyperechoic relative to the liver. No solid mass or hydronephrosis visualized. Amarri Michaelson right kidney upper pole cyst measures 2.0 by 3.0 by 2 2.6 cm and appears simple. Left Kidney: Renal measurements: 9.7 by 4.0 by 4.8 cm = volume: 99 mL. Mildly hyperechoic relative to the spleen. No  mass or hydronephrosis visualized. Bladder: Appears normal for degree of bladder distention. Jaylynn Mcaleer left ureteral jet is visualized. Ascites in the lower abdomen. IMPRESSION: 1. Bilateral renal parenchymal hyperechogenicity suggesting chronic medical renal disease. 2. Right kidney upper pole simple appearing cyst. 3. Adiya Selmer left ureteral jet is visualized in the urinary bladder. Right ureteral jet not seen, although this may simply be incidental. 4. Pelvic ascites. Electronically Signed   By: Van Clines M.D.   On: 08/21/2021 16:00   DG CHEST PORT 1 VIEW  Result Date: 08/20/2021 CLINICAL DATA:  Status post PICC line placement. EXAM: PORTABLE CHEST 1 VIEW COMPARISON:  07/27/2021. FINDINGS: The heart size and mediastinal contours are within normal limits. There is atherosclerotic calcification of the aorta. Patchy airspace disease is noted at the right lung base. There are questionable small bilateral pleural effusions. No  pneumothorax. Haydin Dunn right chest port is stable. No PICC line is identified. Aloysious Vangieson stable pigtail catheter is noted in the right upper quadrant. No acute osseous abnormality. IMPRESSION: 1. Small bilateral pleural effusions with atelectasis at the lung bases. 2. No PICC line is identified.  Clinical correlation is recommended. Electronically Signed   By: Brett Fairy M.D.   On: 08/20/2021 23:45        Scheduled Meds:  amLODipine  5 mg Oral Daily   apixaban  5 mg Oral BID   bisacodyl  10 mg Rectal Once   levothyroxine  125 mcg Oral QAC breakfast   metroNIDAZOLE  500 mg Oral BID   multivitamin with minerals  1 tablet Oral Daily   pantoprazole  40 mg Oral Daily   polyethylene glycol  17 g Oral Daily   Continuous Infusions:  ceFEPime (MAXIPIME) IV 2 g (08/21/21 0941)     LOS: 1 day    Time spent: over 30 min    Fayrene Helper, MD [facility]   To contact the attending provider between 7A-7P or the covering provider during after hours 7P-7A, please log into the web site [email or web address] and access using universal [place] password for that web site. If you do not have the password, please call the hospital operator.  08/21/2021, 5:18 PM

## 2021-08-21 NOTE — Assessment & Plan Note (Addendum)
-  IV fluids held due to mild swelling noted.   -Swelling likely secondary to severe hypoalbuminemia.   Right hand with ring stuck on ring finger, Dr. Florene Glen discussed with RN to help with removal. -Ring has been removed. Renal US done early on during the hospitalization-> without hydro -renal function was initially improving however trending back up likely secondary to poor oral intake.  -Urinalysis ordered however patient refused and not done. -Renal function trending up and trending back down. -Urine sodium noted at 14. -Bladder scan done with about 1200 cc of urine noted, I and O cath none on 08/26/2021 with 900 cc of urine noted.    -Renal ultrasound done negative for hydronephrosis.   -Patient starting to become volume overloaded and as such IV fluids have been saline locked. -Patient placed on IV albumin twice daily.   -Renal function seems to be trending back down.  -Foley Foley catheter placed secondary to urinary retention. -Avoid nephrotoxins.   -Follow.

## 2021-08-21 NOTE — Consult Note (Signed)
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 ([place])   Weakness   Cholecystitis   VTE (venous thromboembolism)   Assessment: 76 YF direct admitted due to N/V and decreased PO intake  # Dehydration # Weakness 2/2 decrease PO intake  # Hx of Subcapsular fluid collection adjacent to the liver(17.6 cm) with Cx NG # Hx of Acute cholecystitis SP cholecystostomy  with IR on 5/5 with Cx with Morganella morganii/B ovatus and E coli  # Hx of  Morganella morganii and E coli  bacteremia on 5/3  #Adenocarcinoma of pancrease, 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 SP CT guided drainage of perihepatic fluid 5/10. Cx no growth. She was discharged on vanc and cefepime with EOT 08/20/21 -CT on 6/2 showed near complete resolution of previous large perihepatic free fluid collection. Small volume residual inferomedial hepatic fluid collection. Plan was for capping trial and 2 week follow-up with IR> She presented ot ED on 6/1 with abdominal pain. CT showed rt perihepatic drainage catheter in place with near complete resolution of perihepatic fluid collection. Numerous liver mets.  Inferior perihepatic fluid collection again noted, measuring 2.3 cm,stable.  -Seen by ID on 6/5, and found to have PO intolerance. She was direct admitted for IVF(did not want to fo to ED). She noted abdominal pain is getting better. -Today, she asking to eat solid food. Don't know if she just had a viral illness.   Recommendations:  -Continue cefepime and metronidazole -Monitor for PO tolerance -When she is able to tolerate PO switch bactrim on discharge with ID follow-up on 6/14  Microbiology:   Antibiotics: Vancomycin 5/3 Cefepime 5/3, 5/6-p Aztreonam 5/3, Ceftriaxone 5/4-5/6 Metronidazole 5/3-p    Cultures:  Prior hospitalization: 5/3 blood Cx+ 2/2 Morgenella Morgani and 1/2 Ecoli 5/5 GB abscess Cx+ Ecoli, morganella morganii and bacteroides ovatus(beta lactamase positive) 5/10liver abscess perihepatic collection with NG  HPI: [name] is a 76 y.o. female with recent admission for acute cholecystitis status post cholecystectomy complicated by intra-abdominal abscess for which patient is on cefepime and metronidazole followed by ID, history of pancreatic cancer, hypothyroidism, hypertension, GAD, venous thromboembolic disease admitted due to poor oral intake and severe dehydration.  Seen by ID on 6/5 patient had p.o. intolerance, did not want to go to the ED so she was direct admitted for IV fluids.  She has been feeling weak and tired for the last 2 days.  She had increasing worsening abdominal pain which has gotten better.  She denies diarrhea.  She does reports that she had vomiting prior to admission.  Found to have dehydration with hyponatremia sodium with 133 and weakness due to decreased p.o. intake.  ID engaged for antibiotic recommendations.   Review of Systems: Review of Systems  All other systems reviewed and are negative.  Past Medical History:  Diagnosis Date   Anxiety    Arthritis    Cancer ([place])    pancreatic   Depression    Diabetes mellitus  without complication ([name])    Hyperlipemia    Hypertension    Thyroid disease    TIA (transient ischemic attack)    Vitamin B12 deficiency 10/20/2019    Social History   Tobacco Use   Smoking status: Former    Packs/day: 1.00    Years: 10.00    Pack years: 10.00    Types: Cigarettes    Quit date: 05/25/1987     Years since quitting: 34.2   Smokeless tobacco: Never  Vaping Use   Vaping Use: Never used  Substance Use Topics   Alcohol use: Not Currently    Alcohol/week: 21.0 standard drinks    Types: 21 Glasses of wine per week    Comment: 3 glasses of wine daily, she stopped in 08/2020   Drug use: No    Family History  Problem Relation Age of Onset   Diabetes Mother    Alzheimer's disease Mother    Dementia Mother    COPD Father    Emphysema Father    Heart disease Sister    Alcohol abuse Brother    Heart disease Brother    Emphysema Brother    Heart attack Daughter    Cancer Other        breast cancer   Scheduled Meds:  amLODipine  5 mg Oral Daily   apixaban  5 mg Oral BID   bisacodyl  10 mg Rectal Once   levothyroxine  125 mcg Oral QAC breakfast   metroNIDAZOLE  500 mg Oral BID   multivitamin with minerals  1 tablet Oral Daily   pantoprazole  40 mg Oral Daily   polyethylene glycol  17 g Oral Daily   Continuous Infusions:  ceFEPime (MAXIPIME) IV 2 g (08/21/21 0941)   PRN Meds:.ALPRAZolam, docusate sodium, ondansetron **OR** ondansetron (ZOFRAN) IV, oxyCODONE-acetaminophen, sodium chloride flush Allergies  Allergen Reactions   Penicillins Hives and Other (See Comments)    Tolerated Ceftriaxone 2023  Has patient had a PCN reaction causing immediate rash, facial/tongue/throat swelling, SOB or lightheadedness with hypotension: No Has patient had a PCN reaction causing severe rash involving mucus membranes or skin necrosis: No Has patient had a PCN reaction that required hospitalization No Has patient had a PCN reaction occurring within the last 10 years: No If all of the above answers are "NO", then may proceed with Cephalosporin use.    OBJECTIVE: Blood pressure 129/87, pulse 85, temperature 98.2 F (36.8 C), temperature source Oral, resp. rate 16, height '5\' 3"'$  (1.6 m), weight 72.8 kg, SpO2 98 %.  Physical Exam Constitutional:      Appearance: Normal appearance.  HENT:      Head: Normocephalic and atraumatic.     Right Ear: Tympanic membrane normal.     Left Ear: Tympanic membrane normal.     Nose: Nose normal.     Mouth/Throat:     Mouth: Mucous membranes are moist.  Eyes:     Extraocular Movements: Extraocular movements intact.     Conjunctiva/sclera: Conjunctivae normal.     Pupils: Pupils are equal, round, and reactive to light.  Cardiovascular:     Rate and Rhythm: Normal rate and regular rhythm.     Heart sounds: No murmur heard.   No friction rub. No gallop.  Pulmonary:     Effort: Pulmonary effort is normal.     Breath sounds: Normal breath sounds.  Abdominal:     General: Abdomen is flat.     Palpations: Abdomen is soft.  Musculoskeletal:  General: Normal range of motion.  Skin:    General: Skin is warm and dry.  Neurological:     General: No focal deficit present.     Mental Status: She is alert and oriented to person, place, and time.  Psychiatric:        Mood and Affect: Mood normal.    Lab Results Lab Results  Component Value Date   WBC 8.4 08/21/2021   HGB 10.7 (L) 08/21/2021   HCT 32.6 (L) 08/21/2021   MCV 96.7 08/21/2021   PLT 160 08/21/2021    Lab Results  Component Value Date   CREATININE 1.13 (H) 08/21/2021   BUN 35 (H) 08/21/2021   NA 134 (L) 08/21/2021   K 3.9 08/21/2021   CL 108 08/21/2021   CO2 19 (L) 08/21/2021    Lab Results  Component Value Date   ALT 13 08/21/2021   AST 34 08/21/2021   ALKPHOS 340 (H) 08/21/2021   BILITOT 0.6 08/21/2021       Laurice Record, [place] 08/21/2021, 2:40 PM dfdf

## 2021-08-21 NOTE — Consult Note (Signed)
Consultation Note Date: 08/21/2021   Patient Name: [name]  DOB: October 13, 1945  MRN: [id number]  Age / Sex: 76 y.o., female  PCP: [name], MD Referring Physician: Elodia Florence., *  Reason for Consultation: Establishing goals of care  HPI/Patient Profile: 76 y.o. female  with past medical history of acute cholecystitis status post cholecystostomy, history of metastatic pancreatic cancer, hypothyroidism, hypertension, generalized anxiety disorder, history of venous thromboembolic disease, and recent hospitalization for sepsis with Morganella morganii/E. coli bacteremia admitted on 08/20/2021 with poor PO intake/dehydration. Also having ongoing nausea and vomiting. She has been feeling weak and tired; unable to get out of bed for 2 days; unable to participate in rehab at facility. Found to have hyponatremia and started on IV fluids. PMT consulted to discuss [place].   Clinical Assessment and Goals of Care: I have reviewed medical records including EPIC notes, labs and imaging, received report from RN, assessed the patient and then met with patient along with two family members at bedside  to discuss diagnosis prognosis, [place], EOL wishes, disposition and options.  I introduced Palliative Medicine as specialized medical care for people living with serious illness. It focuses on providing relief from the symptoms and stress of a serious illness. The goal is to improve quality of life for both the patient and the family.  Patient is familiar with our team - another team member worked with them last admission during May. She tells me she is not interested in our services. I tried to explain that we could be available for extra support as she navigates the healthcare system along with assisting with pain and nausea management however she insists she is not interested at this time. I explained that we are often times associate with hospice and that  palliative is different from hospice - she explains she understands this well but remains uninterested in our services. Family at bedside agrees. I shared that I would sign off and cancel consult but if they became interested in our services to please let the primary physician know to reconsult [place].  Questions and concerns were addressed.   Primary Decision Maker PATIENT    SUMMARY OF RECOMMENDATIONS   Will sign off per patient request - patient and family introduced to role of palliative care  Code Status/Advance Care Planning: DNR per chart review      Primary Diagnoses: Present on Admission:  Dehydration with hyponatremia  Primary pancreatic cancer with metastasis to other site [facility])  Essential hypertension  Acquired hypothyroidism  Cholecystitis  VTE (venous thromboembolism)   I have reviewed the medical record, interviewed the patient and family, and examined the patient. The following aspects are pertinent.  Past Medical History:  Diagnosis Date   Anxiety    Arthritis    Cancer ([place])    pancreatic   Depression    Diabetes mellitus without complication ([place])    Hyperlipemia    Hypertension    Thyroid disease    TIA (transient ischemic attack)    Vitamin B12 deficiency 10/20/2019   Social History   Socioeconomic History   Marital status: Married    Spouse name: Not on file   Number of children: Not on file   Years of education: Not on file   Highest education level: Not on file  Occupational History   Occupation: retired    Comment: Geophysicist/field seismologist   Occupation: caregiver  Tobacco Use   Smoking status: Former    Packs/day: 1.00    Years:  10.00    Pack years: 10.00    Types: Cigarettes    Quit date: 05/25/1987    Years since quitting: 34.2   Smokeless tobacco: Never  Vaping Use   Vaping Use: Never used  Substance and Sexual Activity   Alcohol use: Not Currently    Alcohol/week: 21.0 standard drinks    Types: 21 Glasses of wine per week    Comment: 3  glasses of wine daily, she stopped in 08/2020   Drug use: No   Sexual activity: Not Currently    Birth control/protection: Post-menopausal  Other Topics Concern   Not on file  Social History Narrative   Not on file   Social Determinants of Health   Financial Resource Strain: Low Risk    Difficulty of Paying Living Expenses: Not hard at all  Food Insecurity: No Food Insecurity   Worried About Charity fundraiser in the Last Year: Never true   [place] in the Last Year: Never true  Transportation Needs: No Transportation Needs   Lack of Transportation (Medical): No   Lack of Transportation (Non-Medical): No  Physical Activity: Insufficiently Active   Days of Exercise per Week: 2 days   Minutes of Exercise per Session: 50 min  Stress: No Stress Concern Present   Feeling of Stress : Only a little  Social Connections: Engineer, building services of Communication with Friends and Family: More than three times a week   Frequency of Social Gatherings with Friends and Family: More than three times a week   Attends Religious Services: 1 to 4 times per year   Active Member of [employer] or Organizations: Yes   Attends Archivist Meetings: 1 to 4 times per year   Marital Status: Married   Family History  Problem Relation Age of Onset   Diabetes Mother    Alzheimer's disease Mother    Dementia Mother    COPD Father    Emphysema Father    Heart disease Sister    Alcohol abuse Brother    Heart disease Brother    Emphysema Brother    Heart attack Daughter    Cancer Other        breast cancer   Scheduled Meds:  amLODipine  5 mg Oral Daily   apixaban  5 mg Oral BID   bisacodyl  10 mg Rectal Once   levothyroxine  125 mcg Oral QAC breakfast   metroNIDAZOLE  500 mg Oral BID   multivitamin with minerals  1 tablet Oral Daily   pantoprazole  40 mg Oral Daily   polyethylene glycol  17 g Oral Daily   Continuous Infusions:  sodium chloride 100 mL/hr at 08/21/21 0700    ceFEPime (MAXIPIME) IV Stopped (08/21/21 0105)   PRN Meds:.ALPRAZolam, docusate sodium, ondansetron **OR** ondansetron (ZOFRAN) IV, oxyCODONE-acetaminophen, sodium chloride flush Allergies  Allergen Reactions   Penicillins Hives and Other (See Comments)    Tolerated Ceftriaxone 2023  Has patient had a PCN reaction causing immediate rash, facial/tongue/throat swelling, SOB or lightheadedness with hypotension: No Has patient had a PCN reaction causing severe rash involving mucus membranes or skin necrosis: No Has patient had a PCN reaction that required hospitalization No Has patient had a PCN reaction occurring within the last 10 years: No If all of the above answers are "NO", then may proceed with Cephalosporin use.   Review of Systems  Unable to perform ROS: Other  Patient declines speaking with me  Physical Exam Constitutional:  General: She is not in acute distress.    Appearance: She is ill-appearing.  Pulmonary:     Effort: Pulmonary effort is normal.  Neurological:     Mental Status: She is alert and oriented to person, place, and time.    Vital Signs: BP 129/87 (BP Location: Right Arm)   Pulse 85   Temp 98.2 F (36.8 C) (Oral)   Resp 16   Ht '5\' 3"'  (1.6 m)   Wt 72.8 kg   SpO2 98%   BMI 28.43 kg/m  Pain Scale: 0-10 POSS *See Group Information*: S-Acceptable,Sleep, easy to arouse Pain Score: Asleep   SpO2: SpO2: 98 % O2 Device:SpO2: 98 % O2 Flow Rate: .   IO: Intake/output summary:  Intake/Output Summary (Last 24 hours) at 08/21/2021 5053 Last data filed at 08/21/2021 0700 Gross per 24 hour  Intake 1016.37 ml  Output 0 ml  Net 1016.37 ml    LBM: Last BM Date : 08/20/21 Baseline Weight: Weight: 72.8 kg Most recent weight: Weight: 72.8 kg      *Please note that this is a verbal dictation therefore any spelling or grammatical errors are due to the "[place]" system interpretation.   Juel Burrow, DNP, AGNP-C Palliative Medicine  Team [phone] Pager: [phone]

## 2021-08-21 NOTE — Hospital Course (Signed)
[name] is [name] 76 y.o. female with medical history significant for acute cholecystitis status post cholecystostomy, history of pancreatic cancer, hypothyroidism, hypertension, generalized anxiety disorder, history of venous thromboembolic disease, recent hospitalization for sepsis with Morganella morganii/E. coli bacteremia who was sent to the hospital as [name] direct admission for evaluation of poor oral intake and severe dehydration. Patient's daughter states that since her discharge her oral intake has been very poor but over the last several days she has not had anything to eat or drink due to persistent nausea and vomiting despite taking antiemetics as well as poor appetite.  She complains of feeling very weak and tired and has been unable to get out of bed for the last 2 days.  She feels her legs are very heavy and unable to bear her weight.  She denies having any falls. She complains of abdominal pain mostly in the left lumbar area which she rates [name] 5 x 10 in intensity at its worst.  But denies having any changes in her bowel habits.  She denies having any fever, no chills, no urinary symptoms, no dizziness, no lightheadedness, no chest pain, no shortness of breath, no headache, no blurred vision or focal deficit. Her daughter is concerned about her progressive decline and inability to participate in physical therapy due to weakness.

## 2021-08-22 DIAGNOSIS — E860 Dehydration: Secondary | ICD-10-CM

## 2021-08-22 DIAGNOSIS — K819 Cholecystitis, unspecified: Secondary | ICD-10-CM

## 2021-08-22 DIAGNOSIS — E039 Hypothyroidism, unspecified: Secondary | ICD-10-CM | POA: Diagnosis not present

## 2021-08-22 DIAGNOSIS — C259 Malignant neoplasm of pancreas, unspecified: Secondary | ICD-10-CM

## 2021-08-22 DIAGNOSIS — E871 Hypo-osmolality and hyponatremia: Secondary | ICD-10-CM

## 2021-08-22 DIAGNOSIS — I8290 Acute embolism and thrombosis of unspecified vein: Secondary | ICD-10-CM

## 2021-08-22 DIAGNOSIS — N179 Acute kidney failure, unspecified: Secondary | ICD-10-CM

## 2021-08-22 DIAGNOSIS — E43 Unspecified severe protein-calorie malnutrition: Secondary | ICD-10-CM

## 2021-08-22 LAB — PHOSPHORUS: Phosphorus: 4.6 mg/dL (ref 2.5–4.6)

## 2021-08-22 LAB — CBC WITH DIFFERENTIAL/PLATELET
Abs Immature Granulocytes: 0.06 10*3/uL (ref 0.00–0.07)
Basophils Absolute: 0.1 10*3/uL (ref 0.0–0.1)
Basophils Relative: 1 %
Eosinophils Absolute: 0 10*3/uL (ref 0.0–0.5)
Eosinophils Relative: 1 %
HCT: 31.6 % — ABNORMAL LOW (ref 36.0–46.0)
Hemoglobin: 10.3 g/dL — ABNORMAL LOW (ref 12.0–15.0)
Immature Granulocytes: 1 %
Lymphocytes Relative: 25 %
Lymphs Abs: 1.8 10*3/uL (ref 0.7–4.0)
MCH: 31.7 pg (ref 26.0–34.0)
MCHC: 32.6 g/dL (ref 30.0–36.0)
MCV: 97.2 fL (ref 80.0–100.0)
Monocytes Absolute: 0.6 10*3/uL (ref 0.1–1.0)
Monocytes Relative: 9 %
Neutro Abs: 4.7 10*3/uL (ref 1.7–7.7)
Neutrophils Relative %: 63 %
Platelets: 146 10*3/uL — ABNORMAL LOW (ref 150–400)
RBC: 3.25 MIL/uL — ABNORMAL LOW (ref 3.87–5.11)
RDW: 18.6 % — ABNORMAL HIGH (ref 11.5–15.5)
WBC: 7.3 10*3/uL (ref 4.0–10.5)
nRBC: 0 % (ref 0.0–0.2)

## 2021-08-22 LAB — MAGNESIUM: Magnesium: 2.1 mg/dL (ref 1.7–2.4)

## 2021-08-22 LAB — COMPREHENSIVE METABOLIC PANEL
ALT: 14 U/L (ref 0–44)
AST: 36 U/L (ref 15–41)
Albumin: <1.5 g/dL — ABNORMAL LOW (ref 3.5–5.0)
Alkaline Phosphatase: 340 U/L — ABNORMAL HIGH (ref 38–126)
Anion gap: 7 (ref 5–15)
BUN: 38 mg/dL — ABNORMAL HIGH (ref 8–23)
CO2: 19 mmol/L — ABNORMAL LOW (ref 22–32)
Calcium: 8.2 mg/dL — ABNORMAL LOW (ref 8.9–10.3)
Chloride: 106 mmol/L (ref 98–111)
Creatinine, Ser: 1.32 mg/dL — ABNORMAL HIGH (ref 0.44–1.00)
GFR, Estimated: 41 mL/min — ABNORMAL LOW (ref >60)
Glucose, Bld: 91 mg/dL (ref 70–99)
Potassium: 3.9 mmol/L (ref 3.5–5.1)
Sodium: 132 mmol/L — ABNORMAL LOW (ref 135–145)
Total Bilirubin: 0.6 mg/dL (ref 0.3–1.2)
Total Protein: 5.2 g/dL — ABNORMAL LOW (ref 6.5–8.1)

## 2021-08-22 MED ORDER — SODIUM CHLORIDE 0.9% FLUSH: 5.0000 mL | Freq: Every day | INTRAVENOUS | Status: DC

## 2021-08-22 MED ORDER — MIRTAZAPINE 15 MG PO TBDP
7.5000 mg | ORAL_TABLET | Freq: Every day | ORAL | Status: DC
  Administered 2021-08-22: 7.5 mg via ORAL
  Filled 2021-08-22 (×2): qty 0.5

## 2021-08-22 MED ORDER — SODIUM CHLORIDE 0.9% FLUSH
5.0000 mL | Freq: Every day | INTRAVENOUS | Status: DC
  Administered 2021-08-23 – 2021-08-31 (×7): 5 mL

## 2021-08-22 MED ORDER — SODIUM CHLORIDE 0.9% FLUSH
5.0000 mL | Freq: Every day | INTRAVENOUS | Status: DC
  Administered 2021-08-23 – 2021-08-31 (×7): 5 mL

## 2021-08-22 MED ORDER — BISACODYL 10 MG RE SUPP
10.0000 mg | Freq: Every day | RECTAL | Status: AC
  Administered 2021-08-23: 10 mg via RECTAL
  Filled 2021-08-22: qty 1

## 2021-08-22 MED ORDER — ONDANSETRON HCL 4 MG/2ML IJ SOLN
4.0000 mg | Freq: Three times a day (TID) | INTRAMUSCULAR | Status: AC
  Administered 2021-08-22 – 2021-08-23 (×4): 4 mg via INTRAVENOUS
  Filled 2021-08-22 (×4): qty 2

## 2021-08-22 MED ORDER — DEXTROSE-NACL 5-0.9 % IV SOLN: INTRAVENOUS | Status: DC

## 2021-08-22 MED ORDER — ALBUMIN HUMAN 25 % IV SOLN
25.0000 g | Freq: Four times a day (QID) | INTRAVENOUS | Status: AC
  Administered 2021-08-22 – 2021-08-23 (×4): 25 g via INTRAVENOUS
  Filled 2021-08-22 (×4): qty 100

## 2021-08-22 NOTE — Assessment & Plan Note (Addendum)
-   Patient with severe protein calorie malnutrition with albumin level < 1.5. -Likely secondary to metastatic pancreatic cancer in the setting of poor oral intake secondary to nausea. -Placed on scheduled Zofran 3 times daily x 24 hours. -Status post IV albumin every 6 hours x24 hours. -Encourage oral intake. -Due to acute metabolic encephalopathy with drowsiness and lethargy with some myoclonic jerks Remeron discontinued.  -Patient seen by speech therapist and patient placed on a regular diet with thin liquids.  -Marinol ordered for appetite stimulant per palliative care however noted to be refusing early on in the hospitalization, however seems to be taking it. -Discontinue twice daily IV albumin. -Encourage oral intake.  -Supportive care.

## 2021-08-22 NOTE — Progress Notes (Signed)
Pt daughter called into IR regarding concerns about her mothers drains. Daughter phone number and name confirmed and message passed to radiology PA. Informed daughter that PA may not be able to return phone call today due to time constraints but PA will return call ASAP.

## 2021-08-22 NOTE — TOC Initial Note (Addendum)
Transition of Care [facility]) - Initial/Assessment Note    Patient Details  Name: [name] MRN: [id number] Date of Birth: Dec 27, 1945  [facility] CM/SW Contact:    Carles Collet, RN Phone Number: 08/22/2021, 10:45 AM  Clinical Narrative:               Spoke with patient and her daughter [name] at bedside.  [name] plans on moving in with her mom and stepfather at discharge to assist with her mother;s care. We identified DME need at home, hospital bed, hoyer, bedside table, and RW. These orders are placed, note written and MD asked to cosign. Requested through [employer]. (Informed that bed side table is only covered through hospice)   Patient would like [place] services through [place]. Referral accepted and requested MD to write for PT OT RN HHA.  Patient agreeable to working with PT for bed mobility and transfers. She understands they will recommend SNF, we discussed that plan will not change per her wishes.  Patient and daughter do NOT want any further palliative consults. However they are agreeable for information to be left with them so they can contact palliative when they are ready. Informed them contact information for [facility] will be added to DC paperwork for their convenience Anticipate patient will need nonemergency transport, verified demographics.   Needs HH PT OT RN HHA order DME requested 6/7 through [employer]   Expected Discharge Plan: [place] Barriers to Discharge: No Barriers Identified   Patient Goals and CMS Choice Patient states their goals for this hospitalization and ongoing recovery are:: to go home CMS Medicare.gov Compare Post Acute Care list provided to:: Patient Choice offered to / list presented to : Patient  Expected Discharge Plan and Services Expected Discharge Plan: [place]   Discharge Planning Services: CM Consult Post Acute Care Choice: Durable Medical Equipment, Home Health Living arrangements for the past 2  months: Single Family Home                 DME Arranged: Hospital bed, Walker rolling DME agency- [employer] Date DME Agency Contacted: 08/22/21 Time DME Agency Contacted: [place] Arranged: RN, PT, OT, Nurse's Aide [place] Agency: [place] Date [place]: 08/22/21 Time [place] Agency Contacted: 19 Representative spoke with at [place]: Tommi Rumps  Prior Living Arrangements/Services Living arrangements for the past 2 months: Single Family Home Lives with:: Spouse, Adult Children   Do you feel safe going back to the place where you live?: Yes               Activities of Daily Living      Permission Sought/Granted                  Emotional Assessment              Admission diagnosis:  Dehydration [E86.0] Dehydration with hyponatremia [E86.0, E87.1] Patient Active Problem List   Diagnosis Date Noted   AKI (acute kidney injury) ([place]) 08/21/2021   Poor appetite 08/20/2021   Dehydration with hyponatremia 08/20/2021   VTE (venous thromboembolism) 08/20/2021   PICC (peripherally inserted central catheter) in place 08/08/2021   Port-A-Cath in place 08/06/2021   Medication monitoring encounter 08/06/2021   Perihepatic abscess ([place]) 08/06/2021   Sepsis ([place])    Goals of care, counseling/discussion    Cholecystitis 07/19/2021   Acute cholecystitis 39/76/7341   Acute metabolic encephalopathy 93/79/0240   Hyponatremia 07/18/2021  Decubitus skin ulcer 07/01/2021   Elevated liver enzymes 06/30/2021   Weakness 06/30/2021   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   Hyperbilirubinemia 09/24/2020   Primary pancreatic cancer with metastasis to other site ([place]) 09/12/2020   Vitamin B12 deficiency 10/20/2019   GAD (generalized anxiety disorder) 10/20/2019   Diet-controlled diabetes mellitus ([place]) 10/20/2019   Osteoarthritis, multiple sites 10/20/2019   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 [place] [address]Phone: [phone]: [phone] [place] [address]Phone: [phone]: [phone] [facility] [address]Phone: [phone]: [phone]    Social Determinants of Health (SDOH) Interventions    Readmission Risk Interventions     View : No data to display.

## 2021-08-22 NOTE — Discharge Planning (Signed)
Oncology Discharge Planning Admission Note  [facility] [place] Address: [address], [place], [place] [address] Hours of Operation:  8am - 5pm, Monday - Friday  Clinic Contact Information:  [phone]  Oncology Care Team: Medical Oncologist:  Dr. Betsy Coder  Contacted  daughter, [name]  to inform her that the oncology provider Dr. Benay Spice is aware of this hospital admission dated 08/20/21 and will be rounding on her early tomorrow morning, and the cancer center will follow [name] inpatient care to assist with discharge planning as indicated by the oncologist.  We will reach out to you closer to discharge date to arrange your follow up care.  Disclaimer:  This [place] note does not imply a formal consult request has been made by the admitting attending for this admission or there will be an inpatient consult completed by oncology.  Please request oncology consults as per standard process as indicated.

## 2021-08-22 NOTE — Care Management (Signed)
    Durable Medical Equipment  (From admission, onward)           Start     Ordered   08/22/21 1056  For home use only DME Walker rolling  Once       Question Answer Comment  Walker: With 5 Inch Wheels   Patient needs a walker to treat with the following condition Weakness      08/22/21 1057   08/22/21 1056  For home use only DME Hospital bed  Once       Comments: Required therapeutic mattress Height 5'3" Weight 73KG  Question Answer Comment  Length of Need Lifetime   Patient has (list medical condition): debility, bed bound   The above medical condition requires: Patient requires the ability to reposition frequently   Head must be elevated greater than: 30 degrees   Bed type Semi-electric   Hoyer Lift Yes      08/22/21 1057

## 2021-08-22 NOTE — Progress Notes (Addendum)
PROGRESS NOTE    [name]  AYT:[id number] DOB: Jul 22, 1945 DOA: 08/20/2021 PCP: [name], MD    No chief complaint on file.   Brief Narrative:  [name] is a 76 y.o. female with medical history significant for acute cholecystitis status post cholecystostomy, history of pancreatic cancer, hypothyroidism, hypertension, generalized anxiety disorder, history of venous thromboembolic disease, recent hospitalization for sepsis with Morganella morganii/E. coli bacteremia who was sent to the hospital as a direct admission for evaluation of poor oral intake and severe dehydration. Patient's daughter states that since her discharge her oral intake has been very poor but over the last several days she has not had anything to eat or drink due to persistent nausea and vomiting despite taking antiemetics as well as poor appetite.  She complains of feeling very weak and tired and has been unable to get out of bed for the last 2 days.  She feels her legs are very heavy and unable to bear her weight.  She denies having any falls. She complains of abdominal pain mostly in the left lumbar area which she rates a 5 x 10 in intensity at its worst.  But denies having any changes in her bowel habits.  She denies having any fever, no chills, no urinary symptoms, no dizziness, no lightheadedness, no chest pain, no shortness of breath, no headache, no blurred vision or focal deficit. Her daughter is concerned about her progressive decline and inability to participate in physical therapy due to weakness.    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   -Patient placed back on gentle hydration for the next 1 to 2 days.   -Encourage oral intake.   -Follow.  Cholecystitis Status post cholecystostomy tube insertion In addition, had perihepatic drain placed Saw IR on 5/31 and plan for capping trial with return for imaging in 2 weeks Her symptoms seem to have worsened after this trial, unclear if this is related? CT 08/16/21 with stable CT, small bilateral effusions, numerous mets within liver, drainage catheter within right perihepatic fluid collection and in the gallbladder, pancreatic body mass c/w known pancreatic malignancy, large stool burden IR consulted to see about reattaching bags. With E. coli and Morganella morganii bacteremia Continue antibiotic therapy with cefepime and Flagyl ID following and appreciate input and recommendations.  AKI (acute kidney injury) ([place]) -IV fluids held due to mild swelling noted.   -Swelling likely secondary to severe hypoalbuminemia.   Right hand with ring stuck on ring finger, discussed with RN to help with removal Renal US -> without hydro -renal function improving however creatinine is trending slightly back up. -Gentle hydration with IV fluids. Encourage PO  Weakness Secondary to poor oral intake as well as multiple comorbid conditions. Possibly related to capping trial? Follow symptoms after this. PT/OT evals  Diet-controlled diabetes mellitus ([place]) 6.3 a1c 06/2021 -CBG 119 this morning.  Essential hypertension -Controlled on current regimen of amlodipine.   -Follow.  Acquired hypothyroidism Stable Continue Synthroid Outpatient follow-up.  Decubitus skin ulcer  Protein-calorie malnutrition, severe ([place]) - Patient with severe protein calorie malnutrition with albumin level < 1.5. -Likely secondary to metastatic pancreatic cancer in the setting of poor  oral intake secondary to nausea. -Placed on scheduled Zofran 3 times daily for the next 24 hours. -IV albumin every 6 hours x24 hours. -Encourage oral intake. -Remeron 7.5 mg nightly. -Supportive care.  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 Benay Spice of admission, he'll be by Thursday if she's still here         DVT prophylaxis: Eliquis Code Status: DNR Family Communication: Updated patient and daughter at bedside. Disposition: Likely home with home health therapies  Status is: Inpatient Remains inpatient appropriate because: Severity of illness   Consultants:  Interventional radiology Infectious disease: Dr. Candiss Norse 08/21/2021 Palliative care: Dr. Rowe Pavy 08/21/2021 Oncology informed via epic of admission.  Procedures:  CT abdomen and pelvis 08/21/2021 Chest x-ray 08/20/2021 Renal ultrasound 08/21/2021    Antimicrobials:  Oral Flagyl 08/20/2021>>>> IV cefepime 08/21/2021>>>>    Subjective: Patient laying in bed.  Denies any chest pain.  No shortness of breath.  Some abdominal discomfort.  Still with nausea and emesis.  States when she tries to eat anything she throws up.  Able to tolerate some ginger ale this morning.  Daughter at bedside.  Patient and daughter not interested in palliative care at this time.  Patient noted to be refusing suppositories and MiraLAX.  Objective: Vitals:   08/21/21 1954 08/22/21 0417 08/22/21 0833 08/22/21 1638  BP: 133/87 124/83 140/85 131/80  Pulse: 94 99 92 89  Resp: '16 16 15 20  '$ Temp: 97.6 F (36.4 C) 97.6 F (36.4 C) 98.6 F (37 C) 98.1 F (36.7 C)  TempSrc: Oral Oral Axillary Oral  SpO2: 99% 98% 99% 100%  Weight:      Height:        Intake/Output Summary (Last 24 hours) at 08/22/2021  1841 Last data filed at 08/22/2021 1450 Gross per 24 hour  Intake 240 ml  Output 50 ml  Net 190 ml   Filed Weights   08/20/21 2200  Weight: 72.8 kg    Examination:  General exam: Appears calm and comfortable.  Dry mucous membranes. Respiratory system: Clear to auscultation anterior lung fields.  No wheezes, no crackles, no rhonchi. Respiratory effort normal. Cardiovascular system: S1 & S2 heard, RRR. No JVD, murmurs, rubs, gallops or clicks. No pedal edema. Gastrointestinal system: Abdomen is nondistended, soft and nontender. No organomegaly or masses felt. Normal bowel sounds heard.  Cholecystostomy tube noted right upper quadrant.  Right hepatic drain also noted in place. Central nervous system: Alert and oriented. No focal neurological deficits. Extremities: Symmetric 5 x 5 power. Skin: No rashes, lesions or ulcers Psychiatry: Judgement and insight appear normal. Mood & affect appropriate.     Data Reviewed:   CBC: Recent Labs  Lab 08/16/21 2228 08/20/21 1122 08/21/21 0317 08/22/21 0425  WBC 9.5 12.5* 8.4 7.3  NEUTROABS 6.0  --   --  4.7  HGB 11.7* 13.1 10.7* 10.3*  HCT 37.1 40.0 32.6* 31.6*  MCV 98.7 94.6 96.7 97.2  PLT 161 272 160 146*    Basic Metabolic Panel: Recent Labs  Lab 08/16/21 2228 08/20/21 1122 08/21/21 0317 08/22/21 0425  NA 133* 133* 134* 132*  K 3.8 4.0 3.9 3.9  CL  106 104 108 106  CO2 21* 19* 19* 19*  GLUCOSE 134* 153* 116* 91  BUN 16 31* 35* 38*  CREATININE 0.69 1.05* 1.13* 1.32*  CALCIUM 8.3* 8.6 8.1* 8.2*  MG  --   --   --  2.1  PHOS  --   --   --  4.6    GFR: Estimated Creatinine Clearance: 33.6 mL/min (A) (by C-G formula based on SCr of 1.32 mg/dL (H)).  Liver Function Tests: Recent Labs  Lab 08/16/21 2228 08/21/21 0317 08/22/21 0425  AST 31 34 36  ALT '11 13 14  '$ ALKPHOS 267* 340* 340*  BILITOT 0.5 0.6 0.6  PROT 5.7* 5.3* 5.2*  ALBUMIN 1.7* <1.5* <1.5*    CBG: No results for input(s): GLUCAP in the last 168  hours.   No results found for this or any previous visit (from the past 240 hour(s)).       Radiology Studies: CT ABDOMEN PELVIS W CONTRAST  Result Date: 08/21/2021 CLINICAL DATA:  Cholecystitis status post cholecystostomy tube placement. History of pancreatic cancer. EXAM: CT ABDOMEN AND PELVIS WITH CONTRAST TECHNIQUE: Multidetector CT imaging of the abdomen and pelvis was performed using the standard protocol following bolus administration of intravenous contrast. RADIATION DOSE REDUCTION: This exam was performed according to the departmental dose-optimization program which includes automated exposure control, adjustment of the mA and/or kV according to patient size and/or use of iterative reconstruction technique. CONTRAST:  172m OMNIPAQUE IOHEXOL 300 MG/ML  SOLN COMPARISON:  CT abdomen pelvis dated August 16, 2021. FINDINGS: Lower chest: Unchanged moderate right and small left pleural effusions with adjacent atelectasis in both posterior lower lobes. Hepatobiliary: Multiple hypodense liver lesions are unchanged. Right perihepatic drainage catheter remains in place without significant residual perihepatic fluid collection. 2.2 cm posteroinferior perihepatic fluid collection is unchanged (series 3, image 20). New small amount of pneumobilia in the left liver. Multiple gallstones again noted with slightly increased distention of the gallbladder status post cholecystostomy tube placement. Unchanged common bile duct stent. Pancreas: Unchanged hypodense mass in the pancreatic neck with atrophy of the body and tail. Spleen: Unchanged wedge-shaped hypodensity in the inferior spleen, likely a small infarct. Normal in size. Adrenals/Urinary Tract: Adrenal glands are unremarkable. Unchanged small right renal simple cysts. No follow-up imaging is recommended. No renal calculi or hydronephrosis. The bladder is distended with excreted contrast. Stomach/Bowel: Unchanged small hiatal hernia. The stomach is otherwise  within normal limits. No bowel wall thickening, distention, or surrounding inflammatory changes. Scattered mild colonic diverticulosis. Similar moderate stool burden throughout the colon. Normal appendix. 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 Aortoiliac atherosclerotic vascular disease. No enlarged abdominal or pelvic lymph nodes. Reproductive: Status post hysterectomy. No adnexal masses. Other: Decreased small volume ascites in the pelvis. No pneumoperitoneum. Musculoskeletal: No acute or significant osseous findings. Unchanged anasarca. IMPRESSION: 1. Status post cholecystostomy tube placement with slightly increased distention of the gallbladder compared to prior study. Correlate with tube function. 2. Right perihepatic drainage catheter remains in place without significant residual perihepatic fluid collection around the drain. Unchanged 2.2 cm posteroinferior perihepatic fluid collection separate from the drain. 3. 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 4. Decreased small volume ascites in the pelvis. 5. Unchanged moderate right and small left pleural effusions. 6. 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 7. Aortic Atherosclerosis (ICD10-I70.0). Electronically  Signed   By: Titus Dubin M.D.   On: 08/21/2021 17:58   US RENAL  Result Date: 08/21/2021 CLINICAL DATA:  Acute kidney injury EXAM: RENAL / URINARY TRACT ULTRASOUND COMPLETE COMPARISON:  CT abdomen 08/16/2021 FINDINGS: Right Kidney: Renal measurements: 9.5 by 4.0 by 5.0 cm = volume: 102 mL. Mildly hyperechoic relative to the liver. No solid mass or hydronephrosis visualized. A right kidney upper pole cyst measures 2.0 by 3.0 by 2 2.6 cm and appears simple. Left Kidney: Renal  measurements: 9.7 by 4.0 by 4.8 cm = volume: 99 mL. Mildly hyperechoic relative to the spleen. No mass or hydronephrosis visualized. Bladder: Appears normal for degree of bladder distention. A left ureteral jet is visualized. Ascites in the lower abdomen. IMPRESSION: 1. Bilateral renal parenchymal hyperechogenicity suggesting chronic medical renal disease. 2. Right kidney upper pole simple appearing cyst. 3. A left ureteral jet is visualized in the urinary bladder. Right ureteral jet not seen, although this may simply be incidental. 4. Pelvic ascites. Electronically Signed   By: Van Clines M.D.   On: 08/21/2021 16:00   DG CHEST PORT 1 VIEW  Result Date: 08/20/2021 CLINICAL DATA:  Status post PICC line placement. EXAM: PORTABLE CHEST 1 VIEW COMPARISON:  07/27/2021. FINDINGS: The heart size and mediastinal contours are within normal limits. There is atherosclerotic calcification of the aorta. Patchy airspace disease is noted at the right lung base. There are questionable small bilateral pleural effusions. No pneumothorax. A right chest port is stable. No PICC line is identified. A stable pigtail catheter is noted in the right upper quadrant. No acute osseous abnormality. IMPRESSION: 1. Small bilateral pleural effusions with atelectasis at the lung bases. 2. No PICC line is identified.  Clinical correlation is recommended. Electronically Signed   By: Brett Fairy M.D.   On: 08/20/2021 23:45        Scheduled Meds:  amLODipine  5 mg Oral Daily   apixaban  5 mg Oral BID   bisacodyl  10 mg Rectal Once   bisacodyl  10 mg Rectal Daily   levothyroxine  125 mcg Oral QAC breakfast   metroNIDAZOLE  500 mg Oral BID   mirtazapine  7.5 mg Oral QHS   multivitamin with minerals  1 tablet Oral Daily   ondansetron (ZOFRAN) IV  4 mg Intravenous Q8H   pantoprazole  40 mg Oral Daily   polyethylene glycol  17 g Oral Daily   [START ON 08/23/2021] sodium chloride flush  5 mL Intracatheter Daily   [START ON  08/23/2021] sodium chloride flush  5 mL Intracatheter Daily   Continuous Infusions:  albumin human 25 g (08/22/21 1735)   ceFEPime (MAXIPIME) IV 2 g (08/22/21 1002)   dextrose 5 % and 0.9% NaCl 75 mL/hr at 08/22/21 1351     LOS: 2 days    Time spent: 45 minutes    Irine Seal, MD [facility]   To contact the attending provider between 7A-7P or the covering provider during after hours 7P-7A, please log into the web site [email or web address] and access using universal [place] password for that web site. If you do not have the password, please call the hospital operator.  08/22/2021, 6:41 PM

## 2021-08-22 NOTE — Progress Notes (Signed)
Referring Physician(s): Fayrene Helper   Supervising Physician: Corrie Mckusick  Patient Status:  [facility] - In-pt  Chief Complaint:  S/p perc chole on 5/5, s/p perihepatic drain placement on 5/10   Subjective:  Patient laying in bed, daughter at bedside.  Reports she is doing fine.    Allergies: Penicillins  Medications: Prior to Admission medications   Medication Sig Start Date End Date Taking? Authorizing Provider  ALPRAZolam (XANAX) 0.25 MG tablet Take 0.25 mg by mouth 2 (two) times daily as needed for anxiety. 08/02/21  Yes [provider]  amLODipine (NORVASC) 5 MG tablet Take 5 mg by mouth daily.   Yes [provider]  apixaban (ELIQUIS) 5 MG TABS tablet Take 5 mg by mouth 2 (two) times daily.   Yes [provider]  ceFEPIme (MAXIPIME) 2 g injection Inject 2 g into the muscle 2 (two) times daily. 08/20/21  Yes [provider]  cyanocobalamin (,VITAMIN B-12,) 1000 MCG/ML injection Inject 1 mL (1,000 mcg total) into the muscle every 30 (thirty) days. 01/11/21  Yes [name], MD  docusate sodium (COLACE) 100 MG capsule Take 100 mg by mouth 2 (two) times daily as needed (constipation).   Yes [provider]  famotidine (PEPCID) 20 MG tablet Take 1 tablet (20 mg total) by mouth daily. 08/02/21  Yes Hosie Poisson, MD  levothyroxine (SYNTHROID) 125 MCG tablet TAKE 1 TABLET ON AN EMPTY STOMACH IN THE MORNING Patient taking differently: Take 125 mcg by mouth daily before breakfast. 06/04/21  Yes [name], MD  lidocaine-prilocaine (EMLA) cream APPLY 1 APPLICATION TOPICALLY AS NEEDED. Patient taking differently: Apply 1 application. topically daily as needed (numbing). 07/04/21  Yes Ladell Pier, MD  omeprazole (PRILOSEC) 20 MG capsule TAKE 1 CAPSULE BY MOUTH EVERY DAY Patient taking differently: Take 20 mg by mouth at bedtime. 04/18/21  Yes [name], MD  oxyCODONE (OXY IR/ROXICODONE) 5 MG immediate release tablet Take 5 mg  by mouth 3 (three) times daily as needed for moderate pain or severe pain. 08/17/21  Yes [provider]  oxyCODONE-acetaminophen (PERCOCET/ROXICET) 5-325 MG tablet Take 1-2 tablets by mouth every 6 (six) hours as needed for severe pain. 08/17/21  Yes Horton, Barbette Hair, MD  polyethylene glycol powder (GLYCOLAX/MIRALAX) 17 GM/SCOOP powder Take 17 g by mouth daily. Patient taking differently: Take 17 g by mouth daily as needed for moderate constipation. 08/02/21  Yes Hosie Poisson, MD  prochlorperazine (COMPAZINE) 10 MG tablet Take 1 tablet (10 mg total) by mouth every 6 (six) hours as needed (Nausea or vomiting). 09/12/20  Yes Truitt Merle, MD  ACCU-CHEK AVIVA PLUS test strip As needed 05/09/21   [name], MD  Accu-Chek Softclix Lancets lancets As needed 04/23/21   [name], MD  Alcohol Swabs PADS USE AS DIRECTED 05/18/21   [name], MD  apixaban (ELIQUIS) 5 MG TABS tablet Take 2 tablets (10 mg total) by mouth 2 (two) times daily for 1 day. 08/01/21 08/20/21  Hosie Poisson, MD  apixaban (ELIQUIS) 5 MG TABS tablet Take 2 tablets (10 mg total) by mouth 2 (two) times daily for 1 day. then Take 1 tablet (5 mg total) by mouth 2 (two) times daily. 08/02/21   Hosie Poisson, MD  Blood Glucose Monitoring Suppl (ACCU-CHEK GUIDE) w/Device KIT As needed 05/11/21   [name], MD  calcium carbonate (TUMS EX) 750 MG chewable tablet Chew 1 tablet by mouth daily.    [provider]  LORazepam (ATIVAN) 0.5 MG tablet Take 0.5 mg by mouth as needed for anxiety.    [provider]  metroNIDAZOLE (FLAGYL) 500 MG tablet Take 500 mg by mouth 2 (two) times daily. Last dose will be 08/20/21    [provider]  Multiple Vitamin (MULTIVITAMIN WITH MINERALS) TABS tablet Take 1 tablet by mouth daily. 08/02/21   Hosie Poisson, MD  sodium chloride 0.9 % infusion Inject into the vein. 08/13/21   [provider]  traMADol (ULTRAM) 50 MG tablet Take 1 tablet (50 mg total) by mouth every  6 (six) hours as needed for moderate pain or severe pain. Patient not taking: Reported on 08/09/2021 08/01/21   Hosie Poisson, MD     Vital Signs: BP 140/85 (BP Location: Right Arm)   Pulse 92   Temp 98.6 F (37 C) (Axillary)   Resp 15   Ht _0  (1.6 m)   Wt 160 lb 7.9 oz (72.8 kg)   SpO2 99%   BMI 28.43 kg/m   Physical Exam Vitals reviewed.  Constitutional:      General: She is not in acute distress.    Appearance: Normal appearance.  HENT:     Head: Normocephalic.  Cardiovascular:     Pulses: Normal pulses.  Abdominal:     General: Abdomen is flat.     Palpations: Abdomen is soft.  Skin:    General: Skin is warm and dry.     Coloration: Skin is not jaundiced or pale.     Comments: Positive RUQ, more inferior drain, capped. Site unremarkable with no erythema, edema, tenderness, bleeding or drainage. Suture and stat lock in place. Old, dried green colored fluid noted on the dressing. Drain aspirates and flushes well.   Positive RUQ, more superior drain, capped. Site is unremarkable with no erythema, edema, tenderness, bleeding or drainage. Suture and stat lock in place. Dressing is clean, dry, and intact.  Drain flushes with some resistance, does not aspirate.    Neurological:     Mental Status: She is alert and oriented to person, place, and time.  Psychiatric:        Mood and Affect: Mood normal.        Behavior: Behavior normal.    Imaging: CT ABDOMEN PELVIS W CONTRAST  Result Date: 08/21/2021 CLINICAL DATA:  Cholecystitis status post cholecystostomy tube placement. History of pancreatic cancer. EXAM: CT ABDOMEN AND PELVIS WITH CONTRAST TECHNIQUE: Multidetector CT imaging of the abdomen and pelvis was performed using the standard protocol following bolus administration of intravenous contrast. RADIATION DOSE REDUCTION: This exam was performed according to the departmental dose-optimization program which includes automated exposure control, adjustment of the mA and/or kV  according to patient size and/or use of iterative reconstruction technique. CONTRAST:  145m OMNIPAQUE IOHEXOL 300 MG/ML  SOLN COMPARISON:  CT abdomen pelvis dated August 16, 2021. FINDINGS: Lower chest: Unchanged moderate right and small left pleural effusions with adjacent atelectasis in both posterior lower lobes. Hepatobiliary: Multiple hypodense liver lesions are unchanged. Right perihepatic drainage catheter remains in place without significant residual perihepatic fluid collection. 2.2 cm posteroinferior perihepatic fluid collection is unchanged (series 3, image 20). New small amount of pneumobilia in the left liver. Multiple gallstones again noted with slightly increased distention of the gallbladder status post cholecystostomy tube placement. Unchanged common bile duct stent. Pancreas: Unchanged hypodense mass in the pancreatic neck with atrophy of the body and tail. Spleen: Unchanged wedge-shaped hypodensity in the inferior spleen, likely a small infarct. Normal in size.  Adrenals/Urinary Tract: Adrenal glands are unremarkable. Unchanged small right renal simple cysts. No follow-up imaging is recommended. No renal calculi or hydronephrosis. The bladder is distended with excreted contrast. Stomach/Bowel: Unchanged small hiatal hernia. The stomach is otherwise within normal limits. No bowel wall thickening, distention, or surrounding inflammatory changes. Scattered mild colonic diverticulosis. Similar moderate stool burden throughout the colon. Normal appendix. 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 Aortoiliac atherosclerotic vascular disease. No enlarged abdominal or pelvic lymph nodes. Reproductive: Status post hysterectomy. No adnexal masses. Other: Decreased small volume ascites in the pelvis. No pneumoperitoneum. Musculoskeletal: No acute or significant osseous findings.  Unchanged anasarca. IMPRESSION: 1. Status post cholecystostomy tube placement with slightly increased distention of the gallbladder compared to prior study. Correlate with tube function. 2. Right perihepatic drainage catheter remains in place without significant residual perihepatic fluid collection around the drain. Unchanged 2.2 cm posteroinferior perihepatic fluid collection separate from the drain. 3. 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 4. Decreased small volume ascites in the pelvis. 5. Unchanged moderate right and small left pleural effusions. 6. 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 7. Aortic Atherosclerosis (ICD10-I70.0). Electronically Signed   By: Titus Dubin M.D.   On: 08/21/2021 17:58   US RENAL  Result Date: 08/21/2021 CLINICAL DATA:  Acute kidney injury EXAM: RENAL / URINARY TRACT ULTRASOUND COMPLETE COMPARISON:  CT abdomen 08/16/2021 FINDINGS: Right Kidney: Renal measurements: 9.5 by 4.0 by 5.0 cm = volume: 102 mL. Mildly hyperechoic relative to the liver. No solid mass or hydronephrosis visualized. A right kidney upper pole cyst measures 2.0 by 3.0 by 2 2.6 cm and appears simple. Left Kidney: Renal measurements: 9.7 by 4.0 by 4.8 cm = volume: 99 mL. Mildly hyperechoic relative to the spleen. No mass or hydronephrosis visualized. Bladder: Appears normal for degree of bladder distention. A left ureteral jet is visualized. Ascites in the lower abdomen. IMPRESSION: 1. Bilateral renal parenchymal hyperechogenicity suggesting chronic medical renal disease. 2. Right kidney upper pole simple appearing cyst. 3. A left ureteral jet is visualized in the urinary bladder. Right ureteral jet not seen, although this may simply be incidental. 4. Pelvic ascites. Electronically Signed   By: Van Clines M.D.   On: 08/21/2021 16:00   DG CHEST PORT 1 VIEW  Result  Date: 08/20/2021 CLINICAL DATA:  Status post PICC line placement. EXAM: PORTABLE CHEST 1 VIEW COMPARISON:  07/27/2021. FINDINGS: The heart size and mediastinal contours are within normal limits. There is atherosclerotic calcification of the aorta. Patchy airspace disease is noted at the right lung base. There are questionable small bilateral pleural effusions. No pneumothorax. A right chest port is stable. No PICC line is identified. A stable pigtail catheter is noted in the right upper quadrant. No acute osseous abnormality. IMPRESSION: 1. Small bilateral pleural effusions with atelectasis at the lung bases. 2. No PICC line is identified.  Clinical correlation is recommended. Electronically Signed   By: Brett Fairy M.D.   On: 08/20/2021 23:45    Labs:  CBC: Recent Labs    08/16/21 2228 08/20/21 1122 08/21/21 0317 08/22/21 0425  WBC 9.5 12.5* 8.4 7.3  HGB 11.7* 13.1 10.7* 10.3*  HCT 37.1 40.0 32.6* 31.6*  PLT 161 272 160 146*    COAGS: Recent Labs    10/14/20 1457 06/30/21 0939 07/18/21 1503 07/19/21 0445 07/24/21  1306  INR 1.1 1.1 1.2 1.3* 1.2  APTT 45*  --  28  --   --     BMP: Recent Labs    08/09/21 0936 08/16/21 2228 08/20/21 1122 08/21/21 0317 08/22/21 0425  NA 134* 133* 133* 134* 132*  K 3.9 3.8 4.0 3.9 3.9  CL 103 106 104 108 106  CO2 22 21* 19* 19* 19*  GLUCOSE 164* 134* 153* 116* 91  BUN 9 16 31* 35* 38*  CALCIUM 8.5* 8.3* 8.6 8.1* 8.2*  CREATININE 0.58 0.69 1.05* 1.13* 1.32*  GFRNONAA >60 >60  --  50* 41*    LIVER FUNCTION TESTS: Recent Labs    08/09/21 0936 08/16/21 2228 08/21/21 0317 08/22/21 0425  BILITOT 0.5 0.5 0.6 0.6  AST 18 31 34 36  ALT _0 ALKPHOS 177* 267* 340* 340*  PROT 6.5 5.7* 5.3* 5.2*  ALBUMIN 2.8* 1.7* <1.5* <1.5*    Assessment and Plan:  76 y.o. female with pancreatic CA and acute cholecystitis status post perc chole placement with IR on 07/20/21, perihepatic drain placement on 5/10, who underwent perc chole capping  trial from 5/31, hospitalized on 6/4 due to dehydration with hyponatremia and generalized weakness.   AST/ALT/ T bili all w/in norma limit x 13 days  VSS CBC stable, no leukocytosis   IR was contacted on 6/6 due to leakage from perc chole site, CT AP with was ordered for eval.  CT reviewed with Dr. Earleen Newport this morning, recommend attaching perc chole and perihepatic drains to gravity bag.   The perc chole drain and perihepatic were attached to gravity bag at bedside.  Drain care ordered placed: - qd flushing with 5 mL  - document OP q shift  - dressing change every 2 days or when soiled   Drain Location: RUQ- perc chole  Size: Fr size: 10 Fr Date of placement: 07/20/21   Currently to: Drain collection device: gravity  Drain Location: RUQ- perihepatic drain  Size: Fr size: 12 Fr Date of placement: 07/25/21   Currently to: Drain collection device: gravity 24 hour output:  Output by Drain (mL) 08/20/21 0701 - 08/20/21 1900 08/20/21 1901 - 08/21/21 0700 08/21/21 0701 - 08/21/21 1900 08/21/21 1901 - 08/22/21 0700 08/22/21 0701 - 08/22/21 1010  Closed System Drain RUQ 10.2 Fr.  0     Closed System Drain 1 Right RUQ Other (Comment) 12 Fr.  0       Interval imaging/drain manipulation:  5/5: perc chole by Dr. Annamaria Boots  5/9: CT AP with   Interval placement of cholecystostomy tube with decompression of the gallbladder since prior study. New subcapsular fluid collection adjacent to the liver, the largest component measuring up to 17.6 cm. 5/10:CT guided drain placement  Large right-sided perihepatic fluid collection by Dr. Kathlene Cote  5/31: cholagiogram No fluoroscopic evidence of cystic duct patency 6/7: CT AP with:  1. Status post cholecystostomy tube placement with slightly increased distention of the gallbladder compared to prior study. Correlate with tube function. 2. Right perihepatic drainage catheter remains in place without significant residual perihepatic fluid collection around the  drain. Unchanged 2.2 cm posteroinferior perihepatic fluid collection separate from the drain. 3. 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 4. Decreased small volume ascites in the pelvis. 5. Unchanged moderate right and small left pleural effusions. 6. 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 7. Aortic Atherosclerosis (ICD10-I70.0).  Current  examination: Perc chole flushes/aspirates easily.  Perihepatic drain flushes ok, does not aspirate.  Insertion site unremarkable. Suture and stat lock in place. Dressed appropriately.   Plan: Continue QD flushes with 5 cc NS. Record output Q shift. Dressing changes QD or PRN if soiled.  Call IR APP or on call IR MD if difficulty flushing or sudden change in drain output.  Repeat imaging/possible drain injection once output < 10 mL/QD (excluding flush material.)  Perihepatic drain may be removed if output remains less than 10 mL /day for couple days.   Discharge planning: Please contact IR APP or on call IR MD prior to patient d/c to ensure appropriate follow up plans are in place. Typically patient will follow up with IR clinic 10-14 days post d/c for repeat imaging/possible drain injection. IR scheduler will contact patient with date/time of appointment. Patient will need to flush drain QD with 5 cc NS, record output QD, dressing changes every 2-3 days or earlier if soiled.   IR will continue to follow - please call with questions or concerns.  Electronically Signed: Tera Mater, PA-C 08/22/2021, 10:00 AM   I spent a total of 25 Minutes at the the patient's bedside AND on the patient's hospital floor or unit, greater than 50% of which was counseling/coordinating care for perc chole and perihepatic drain.   This chart was dictated using voice recognition software.  Despite best efforts to proofread,  errors can  occur which can change the documentation meaning.

## 2021-08-23 ENCOUNTER — Inpatient Hospital Stay (HOSPITAL_COMMUNITY): Payer: Medicare HMO

## 2021-08-23 DIAGNOSIS — E039 Hypothyroidism, unspecified: Secondary | ICD-10-CM | POA: Diagnosis not present

## 2021-08-23 DIAGNOSIS — Z7189 Other specified counseling: Secondary | ICD-10-CM

## 2021-08-23 DIAGNOSIS — E860 Dehydration: Secondary | ICD-10-CM | POA: Diagnosis not present

## 2021-08-23 DIAGNOSIS — G9341 Metabolic encephalopathy: Secondary | ICD-10-CM

## 2021-08-23 DIAGNOSIS — K819 Cholecystitis, unspecified: Secondary | ICD-10-CM | POA: Diagnosis not present

## 2021-08-23 DIAGNOSIS — E871 Hypo-osmolality and hyponatremia: Secondary | ICD-10-CM | POA: Diagnosis not present

## 2021-08-23 DIAGNOSIS — N179 Acute kidney failure, unspecified: Secondary | ICD-10-CM | POA: Diagnosis not present

## 2021-08-23 LAB — CBC WITH DIFFERENTIAL/PLATELET
Abs Immature Granulocytes: 0.01 10*3/uL (ref 0.00–0.07)
Basophils Absolute: 0 10*3/uL (ref 0.0–0.1)
Basophils Relative: 1 %
Eosinophils Absolute: 0 10*3/uL (ref 0.0–0.5)
Eosinophils Relative: 1 %
HCT: 28.5 % — ABNORMAL LOW (ref 36.0–46.0)
Hemoglobin: 9.2 g/dL — ABNORMAL LOW (ref 12.0–15.0)
Immature Granulocytes: 0 %
Lymphocytes Relative: 19 %
Lymphs Abs: 0.9 10*3/uL (ref 0.7–4.0)
MCH: 31.7 pg (ref 26.0–34.0)
MCHC: 32.3 g/dL (ref 30.0–36.0)
MCV: 98.3 fL (ref 80.0–100.0)
Monocytes Absolute: 0.4 10*3/uL (ref 0.1–1.0)
Monocytes Relative: 9 %
Neutro Abs: 3.4 10*3/uL (ref 1.7–7.7)
Neutrophils Relative %: 70 %
Platelets: 103 10*3/uL — ABNORMAL LOW (ref 150–400)
RBC: 2.9 MIL/uL — ABNORMAL LOW (ref 3.87–5.11)
RDW: 18.4 % — ABNORMAL HIGH (ref 11.5–15.5)
WBC: 4.9 10*3/uL (ref 4.0–10.5)
nRBC: 0 % (ref 0.0–0.2)

## 2021-08-23 LAB — COMPREHENSIVE METABOLIC PANEL
ALT: 35 U/L (ref 0–44)
AST: 205 U/L — ABNORMAL HIGH (ref 15–41)
Albumin: 2.6 g/dL — ABNORMAL LOW (ref 3.5–5.0)
Alkaline Phosphatase: 637 U/L — ABNORMAL HIGH (ref 38–126)
Anion gap: 5 (ref 5–15)
BUN: 39 mg/dL — ABNORMAL HIGH (ref 8–23)
CO2: 18 mmol/L — ABNORMAL LOW (ref 22–32)
Calcium: 8.6 mg/dL — ABNORMAL LOW (ref 8.9–10.3)
Chloride: 111 mmol/L (ref 98–111)
Creatinine, Ser: 1.48 mg/dL — ABNORMAL HIGH (ref 0.44–1.00)
GFR, Estimated: 36 mL/min — ABNORMAL LOW (ref >60)
Glucose, Bld: 93 mg/dL (ref 70–99)
Potassium: 3.5 mmol/L (ref 3.5–5.1)
Sodium: 134 mmol/L — ABNORMAL LOW (ref 135–145)
Total Bilirubin: 1.6 mg/dL — ABNORMAL HIGH (ref 0.3–1.2)
Total Protein: 5.3 g/dL — ABNORMAL LOW (ref 6.5–8.1)

## 2021-08-23 LAB — AMMONIA: Ammonia: 28 umol/L (ref 9–35)

## 2021-08-23 MED ORDER — SORBITOL 70 % SOLN
30.0000 mL | Status: DC
Start: 2021-08-23 — End: 2021-08-23
  Filled 2021-08-23: qty 30

## 2021-08-23 MED ORDER — CIPROFLOXACIN IN D5W 400 MG/200ML IV SOLN
400.0000 mg | Freq: Two times a day (BID) | INTRAVENOUS | Status: DC
  Filled 2021-08-23 (×2): qty 200

## 2021-08-23 MED ORDER — THIAMINE HCL 100 MG/ML IJ SOLN
250.0000 mg | Freq: Every day | INTRAVENOUS | Status: AC
  Administered 2021-08-26 – 2021-08-31 (×6): 250 mg via INTRAVENOUS
  Filled 2021-08-23 (×7): qty 2.5

## 2021-08-23 MED ORDER — OXYCODONE-ACETAMINOPHEN 5-325 MG PO TABS: 1.0000 | ORAL_TABLET | Freq: Four times a day (QID) | ORAL | Status: DC | PRN

## 2021-08-23 MED ORDER — THIAMINE HCL 100 MG/ML IJ SOLN: 100.0000 mg | Freq: Every day | INTRAMUSCULAR | Status: DC

## 2021-08-23 MED ORDER — LORAZEPAM 2 MG/ML IJ SOLN: 1.0000 mg | Freq: Once | INTRAMUSCULAR | Status: DC

## 2021-08-23 MED ORDER — THIAMINE HCL 100 MG/ML IJ SOLN
500.0000 mg | Freq: Three times a day (TID) | INTRAVENOUS | Status: AC
  Administered 2021-08-24 – 2021-08-25 (×4): 500 mg via INTRAVENOUS
  Filled 2021-08-23 (×6): qty 5

## 2021-08-23 MED ORDER — SODIUM BICARBONATE 8.4 % IV SOLN
INTRAVENOUS | Status: AC
  Filled 2021-08-23 (×4): qty 1000

## 2021-08-23 MED ORDER — METRONIDAZOLE 500 MG/100ML IV SOLN
500.0000 mg | Freq: Two times a day (BID) | INTRAVENOUS | Status: DC
  Administered 2021-08-23 – 2021-08-27 (×7): 500 mg via INTRAVENOUS
  Filled 2021-08-23 (×7): qty 100

## 2021-08-23 NOTE — Progress Notes (Addendum)
HEMATOLOGY-ONCOLOGY PROGRESS NOTE  ASSESSMENT AND PLAN: Adenocarcinoma the pancreas body, stage IV (cT4,cN0,pM1) Ultrasound abdomen 08/18/2020-possible hypoechoic pancreas body mass, small hypoechoic liver areas MRI abdomen 08/27/2020-pancreas body mass, multiple hepatic lesions consistent with metastases, right abdominal omental nodularity, pancreas mass extends to the celiac bifurcation and abuts the splenic vein and splenoportal confluence CT chest 09/07/2020-scattered pulmonary nodules concerning for metastases, pancreas body mass, hepatic metastases Ultrasound-guided biopsy of a right liver lesion 09/08/2020-adenocarcinoma consistent with a pancreas primary CT abdomen/pelvis 09/24/2020-pancreas neck mass, omental and peritoneal nodularity-unchanged, multiple hypoenhancing liver masses-unchanged, numerous small pulmonary nodules Cycle 1 gemcitabine/Abraxane 10/04/2020 Cycle 2 gemcitabine/Abraxane 10/27/2020 Cycle 3 gemcitabine/Abraxane 11/11/2018 Cycle 4 gemcitabine/Abraxane 11/23/2020 Cycle 5 gemcitabine/Abraxane 12/08/2020 CT abdomen/pelvis 12/18/2020-stable pancreas mass, stable and improved liver lesions, resolution of omental soft tissue density, stable indeterminate lung nodules, no disease progression Cycle 6 gemcitabine/Abraxane 12/22/2020 Cycle 7 gemcitabine 01/05/2021, 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  Cycle 8 gemcitabine/Abraxane 01/19/2021 Cycle 9 gemcitabine/Abraxane 02/01/2021 Cycle 10 Gemcitabine 02/16/2021, Abraxane held due to increased neuropathy Cycle 11 Gemcitabine 03/05/2021, 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 Cycle 12 gemcitabine 03/14/2021, 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 Cycle 13 gemcitabine 04/02/2021, 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 Cycle 14 gemcitabine/Abraxane 04/16/2021, Abraxane resumed at a reduced dose Cycle 15 gemcitabine/Abraxane 04/30/2021 CT abdomen/pelvis 05/08/2021-stable pancreas mass, liver metastases are slightly smaller, stable tiny bilateral lower lung nodules Cycle  16 gemcitabine/Abraxane 05/14/2021, Abraxane escalated back to 100 mg per metered squared Cycle 17 gemcitabine/Abraxane 05/28/2021 Cycle 18 gemcitabine/Abraxane 06/11/2021 Cycle 19 gemcitabine/Abraxane 07/09/2021 CT Abdo/pelvis 07/18/2021-diffuse gallbladder wall thickening and edema, small volume ascites, stable pulmonary nodules, indeterminate 15 x 18 mm right liver lesion, ill-defined hypodensity in the body of the pancreas 07/24/2021-right pleural fluid cytology-negative   2.  Pain secondary #1 3.  Diabetes 4.  Hypertension 5.  Osteoarthritis 6.  Port-A-Cath placement 09/22/2020 7.  Admission with jaundice 09/24/2020.  MRI-new abrupt constriction of the common hepatic duct.  The infiltrative pancreatic mass extends medially in the porta hepatis to obstruct the common hepatic duct.  Multiple right hepatic lobe metastases mildly increased in size.  Stent placed into the common bile duct 09/28/2020. 8.  COVID-19 06/20/2021, admission with COVID and bacterial pneumonia?  06/30/2021-completed course of antibiotics 9.  Hospital admission 07/18/2021-bacteremia, UTI, acute cholecystitis Cholecystostomy tube 07/20/2021 CT abdomen/pelvis 07/24/2021-moderate right pleural effusion, new subcapsular fluid collections adjacent to the right liver, interval cholecystostomy tube, stable pancreas body mass, incidental pulmonary embolism and a left lower lobe pulmonary artery Drain in perihepatic fluid collection 07/27/2021 10.  Pulmonary embolism/bilateral DVTs CT abdomen/pelvis 07/24/2021-incidental left lower lobe pulmonary embolism CT chest 07/24/2021-segmental and subsegmental bilateral pulmonary emboli Dopplers 07/24/2021-DVTs in the right common femoral, left common femoral, left profunda veins Heparin 07/24/2021, converted to apixaban on discharge 08/01/2021 11.  Bilateral foot numbness developing during hospital admission May 2023-Abraxane neuropathy? [facility] admission 08/20/2021-dehydration, weakness  [name] has been  admitted to the hospital with dehydration and hyponatremia.  She was found to have AKI.  She has had poor p.o. intake recently likely causing the dehydration.  She is receiving IV fluids.  The patient has been receiving chemotherapy for pancreatic cancer.  She has not received chemotherapy since 07/09/2021 due to hospitalization and weakness.  CT abdomen/pelvis performed on admission reviewed which does not show definitive evidence of disease progression.  She is currently not a candidate for systemic chemotherapy due to her poor nutritional status and functional status.  She will need improvement in both of these before we can consider additional systemic treatment.  Recommend continuation of IV fluids and close monitoring of renal function.  She would benefit from a dietitian consult.  Recommendations: 1.  Continue IV fluids and close monitoring of renal function per primary team. 2.  Antibiotics per ID. 3.  Dietitian consult 4.  Recommend PT/OT evaluation and out of bed as much as possible. 5.  Hospice referral if her status does not improve over the next few days  [name]  [name] was interviewed and examined.  She is well-known to me with a history of metastatic pancreas cancer.  She is admitted to failure to thrive following a recent diagnosis of cholecystitis and bacteremia.  She was lethargic when I saw her this morning.  There is no clear evidence of progressive pancreas cancer, but I am concerned she could have progressive metastatic disease not appreciated on the CT.  The liver lesions do not appear significantly changed on the short interval follow-up CT, but have increased compared to the CT in early May.  We will need to consider a hospice referral if her performance status does not improve over the next few days.  It was present for greater than 50% of today's visit.  I performed medical decision making.  SUBJECTIVE: [name] is followed by our office for pancreatic  cancer.  She was last seen on 08/09/2021.  At that visit, she was overall weak and was unable to resume chemotherapy.  She was residing at a SNF for short-term rehab.  She was seen as an outpatient by ID on the day of admission and found to have significant anorexia and fatigue.  She was much weaker than she had been.  She was directly admitted to the hospital.  She was found to be dehydrated.   When I saw her this afternoon, her husband was at the bedside.  He states that she has been sleeping since he has been here today.  Difficult to awake.  I was asked not to awaken the patient this afternoon.  Oncology History Overview Note  Cancer Staging Primary pancreatic cancer with metastasis to other site [facility]) Staging form: Exocrine Pancreas, AJCC 8th Edition - Clinical stage from 09/08/2020: Stage IV (cT4, cN0, pM1) - Signed by Truitt Merle, MD on 09/12/2020 Stage prefix: Initial diagnosis Total positive nodes: 0    Primary pancreatic cancer with metastasis to other site ([place])  08/27/2020 Imaging   Abdominal MRI w wo contrast: IMPRESSION: 1. Pancreatic body infiltrative mass, favoring adenocarcinoma. 2. Multiple hepatic lesions, most consistent with metastasis. 3. Right abdominal omental nodularity, highly suspicious for peritoneal metastasis. 4. Recommend multidisciplinary oncology consultation for eventual sampling of either the liver or pancreatic lesions. 5. Vascular involvement with tumor, as detailed above.   09/07/2020 Imaging   CT chest  IMPRESSION: 1. Scattered small pulmonary nodules, worrisome for metastatic disease. 2. Pancreatic body mass and hepatic metastases, better seen and described on MR abdomen 08/27/2020. 3. Aortic atherosclerosis (ICD10-I70.0). Coronary artery calcification.   09/08/2020 Cancer Staging   Staging form: Exocrine Pancreas, AJCC 8th Edition - Clinical stage from 09/08/2020: Stage IV (cT4, cN0, pM1) - Signed by Truitt Merle, MD on 09/12/2020 Stage prefix:  Initial diagnosis Total positive nodes: 0   09/08/2020 Pathology Results   A. LIVER, RIGHT, BIOPSY:  - Adenocarcinoma.  See comment.   COMMENT:   The morphology is consistent with a pancreatic primary.    09/12/2020 Initial Diagnosis   Primary pancreatic cancer with metastasis to other site [facility])   10/12/2020 -  Chemotherapy  Patient is on Treatment Plan : PANCREATIC Abraxane / Gemcitabine D1,8,15 q28d       PHYSICAL EXAMINATION:  Vitals:   08/23/21 0635 08/23/21 0810  BP: (!) 150/83 (!) 148/81  Pulse: 98 (!) 102  Resp: 18 18  Temp: 98.4 F (36.9 C) 98.3 F (36.8 C)  SpO2: 96% 97%   Filed Weights   08/20/21 2200  Weight: 72.8 kg    Intake/Output from previous day: 06/07 0701 - 06/08 0700 In: 75 [I.V.:75] Out: 480 [Urine:400; Drains:80]  Physical Exam Vitals reviewed.  Constitutional:      Comments: Sleeping, no distress  Abdominal:     Comments: 2 drains in place   Exam: 6:30 AM-lethargic, arousable HEENT-no thrush Cardiac-regular rate and rhythm Lungs-clear anteriorly Abdomen-to right upper abdomen drains, one with a bloody drainage, no mass, mildly distended Vascular-trace pedal edema Neurologic-follow simple commands, bilateral hand tremor  LABORATORY DATA:  I have reviewed the data as listed    Latest Ref Rng & Units 08/23/2021    4:56 AM 08/22/2021    4:25 AM 08/21/2021    3:17 AM  CMP  Glucose 70 - 99 mg/dL 93  91  116   BUN 8 - 23 mg/dL 39  38  35   Creatinine 0.44 - 1.00 mg/dL 1.48  1.32  1.13   Sodium 135 - 145 mmol/L 134  132  134   Potassium 3.5 - 5.1 mmol/L 3.5  3.9  3.9   Chloride 98 - 111 mmol/L 111  106  108   CO2 22 - 32 mmol/L '18  19  19   '$ Calcium 8.9 - 10.3 mg/dL 8.6  8.2  8.1   Total Protein 6.5 - 8.1 g/dL 5.3  5.2  5.3   Total Bilirubin 0.3 - 1.2 mg/dL 1.6  0.6  0.6   Alkaline Phos 38 - 126 U/L 637  340  340   AST 15 - 41 U/L 205  36  34   ALT 0 - 44 U/L 35  14  13     Lab Results  Component Value Date   WBC 4.9 08/23/2021    HGB 9.2 (L) 08/23/2021   HCT 28.5 (L) 08/23/2021   MCV 98.3 08/23/2021   PLT 103 (L) 08/23/2021   NEUTROABS 3.4 08/23/2021    Lab Results  Component Value Date   CAN199 16,591 (H) 08/09/2021    CT HEAD WO CONTRAST (5MM)  Result Date: 08/23/2021 CLINICAL DATA:  Altered mental status EXAM: CT HEAD WITHOUT CONTRAST TECHNIQUE: Contiguous axial images were obtained from the base of the skull through the vertex without intravenous contrast. RADIATION DOSE REDUCTION: This exam was performed according to the departmental dose-optimization program which includes automated exposure control, adjustment of the mA and/or kV according to patient size and/or use of iterative reconstruction technique. COMPARISON:  06/01/2016 FINDINGS: Brain: No acute intracranial findings are seen in noncontrast CT brain. There are no signs of bleeding. Cortical sulci are prominent. There is decreased density in the periventricular white matter. Vascular: Unremarkable. Skull: Unremarkable. Sinuses/Orbits: Unremarkable. Other: No significant interval changes are noted. IMPRESSION: No acute intracranial findings are seen in noncontrast CT brain. Atrophy. Small-vessel disease. Electronically Signed   By: Elmer Picker M.D.   On: 08/23/2021 13:11   CT ABDOMEN PELVIS W CONTRAST  Result Date: 08/21/2021 CLINICAL DATA:  Cholecystitis status post cholecystostomy tube placement. History of pancreatic cancer. EXAM: CT ABDOMEN AND PELVIS WITH CONTRAST TECHNIQUE: Multidetector CT imaging of the abdomen and pelvis was performed  using the standard protocol following bolus administration of intravenous contrast. RADIATION DOSE REDUCTION: This exam was performed according to the departmental dose-optimization program which includes automated exposure control, adjustment of the mA and/or kV according to patient size and/or use of iterative reconstruction technique. CONTRAST:  138m OMNIPAQUE IOHEXOL 300 MG/ML  SOLN COMPARISON:  CT abdomen  pelvis dated August 16, 2021. FINDINGS: Lower chest: Unchanged moderate right and small left pleural effusions with adjacent atelectasis in both posterior lower lobes. Hepatobiliary: Multiple hypodense liver lesions are unchanged. Right perihepatic drainage catheter remains in place without significant residual perihepatic fluid collection. 2.2 cm posteroinferior perihepatic fluid collection is unchanged (series 3, image 20). New small amount of pneumobilia in the left liver. Multiple gallstones again noted with slightly increased distention of the gallbladder status post cholecystostomy tube placement. Unchanged common bile duct stent. Pancreas: Unchanged hypodense mass in the pancreatic neck with atrophy of the body and tail. Spleen: Unchanged wedge-shaped hypodensity in the inferior spleen, likely a small infarct. Normal in size. Adrenals/Urinary Tract: Adrenal glands are unremarkable. Unchanged small right renal simple cysts. No follow-up imaging is recommended. No renal calculi or hydronephrosis. The bladder is distended with excreted contrast. Stomach/Bowel: Unchanged small hiatal hernia. The stomach is otherwise within normal limits. No bowel wall thickening, distention, or surrounding inflammatory changes. Scattered mild colonic diverticulosis. Similar moderate stool burden throughout the colon. Normal appendix. 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 Aortoiliac atherosclerotic vascular disease. No enlarged abdominal or pelvic lymph nodes. Reproductive: Status post hysterectomy. No adnexal masses. Other: Decreased small volume ascites in the pelvis. No pneumoperitoneum. Musculoskeletal: No acute or significant osseous findings. Unchanged anasarca. IMPRESSION: 1. Status post cholecystostomy tube placement with slightly increased distention of the gallbladder compared to prior  study. Correlate with tube function. 2. Right perihepatic drainage catheter remains in place without significant residual perihepatic fluid collection around the drain. Unchanged 2.2 cm posteroinferior perihepatic fluid collection separate from the drain. 3. 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 4. Decreased small volume ascites in the pelvis. 5. Unchanged moderate right and small left pleural effusions. 6. 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 7. Aortic Atherosclerosis (ICD10-I70.0). Electronically Signed   By: WTitus DubinM.D.   On: 08/21/2021 17:58   [place]  Result Date: 08/21/2021 CLINICAL DATA:  Acute kidney injury EXAM: RENAL / URINARY TRACT ULTRASOUND COMPLETE COMPARISON:  CT abdomen 08/16/2021 FINDINGS: Right Kidney: Renal measurements: 9.5 by 4.0 by 5.0 cm = volume: 102 mL. Mildly hyperechoic relative to the liver. No solid mass or hydronephrosis visualized. A right kidney upper pole cyst measures 2.0 by 3.0 by 2 2.6 cm and appears simple. Left Kidney: Renal measurements: 9.7 by 4.0 by 4.8 cm = volume: 99 mL. Mildly hyperechoic relative to the spleen. No mass or hydronephrosis visualized. Bladder: Appears normal for degree of bladder distention. A left ureteral jet is visualized. Ascites in the lower abdomen. IMPRESSION: 1. Bilateral renal parenchymal hyperechogenicity suggesting chronic medical renal disease. 2. Right kidney upper pole simple appearing cyst. 3. A left ureteral jet is visualized in the urinary bladder. Right ureteral jet not seen, although this may simply be incidental. 4. Pelvic ascites. Electronically Signed   By: WVan ClinesM.D.   On: 08/21/2021 16:00   DG CHEST PORT 1 VIEW  Result Date: 08/20/2021 CLINICAL DATA:  Status  post PICC line placement. EXAM: PORTABLE CHEST 1 VIEW COMPARISON:  07/27/2021. FINDINGS: The heart size and  mediastinal contours are within normal limits. There is atherosclerotic calcification of the aorta. Patchy airspace disease is noted at the right lung base. There are questionable small bilateral pleural effusions. No pneumothorax. A right chest port is stable. No PICC line is identified. A stable pigtail catheter is noted in the right upper quadrant. No acute osseous abnormality. IMPRESSION: 1. Small bilateral pleural effusions with atelectasis at the lung bases. 2. No PICC line is identified.  Clinical correlation is recommended. Electronically Signed   By: Brett Fairy M.D.   On: 08/20/2021 23:45   CT ABDOMEN PELVIS W CONTRAST  Result Date: 08/17/2021 CLINICAL DATA:  Abdominal pain, acute, nonlocalized EXAM: CT ABDOMEN AND PELVIS WITH CONTRAST TECHNIQUE: Multidetector CT imaging of the abdomen and pelvis was performed using the standard protocol following bolus administration of intravenous contrast. RADIATION DOSE REDUCTION: This exam was performed according to the departmental dose-optimization program which includes automated exposure control, adjustment of the mA and/or kV according to patient size and/or use of iterative reconstruction technique. CONTRAST:  62m OMNIPAQUE IOHEXOL 300 MG/ML  SOLN COMPARISON:  08/15/2021 FINDINGS: Lower chest: Small bilateral pleural effusions. Compressive atelectasis in the lower lobes. Findings stable since prior study. Hepatobiliary: Right perihepatic drainage catheter remains in place with near complete resolution of perihepatic fluid collection. Inferior perihepatic fluid collection again noted, measuring 2.3 cm, stable. Numerous hypodensities throughout the liver are again seen and unchanged compatible with metastases. Cholecystostomy tube in place. Gallbladder decompressed. Gallstone noted within the gallbladder. Common bile duct stent noted, unchanged. Pancreas: Ill-defined hypodensity in the body of the pancreas compatible with known pancreatic malignancy.  Pancreatic tail atrophy. Spleen: No focal abnormality.  Normal size. Adrenals/Urinary Tract: Right upper pole renal cyst. No hydronephrosis. Adrenal glands and urinary bladder unremarkable. Stomach/Bowel: Large stool burden in the colon. Sigmoid diverticulosis. Stomach and small bowel decompressed. Vascular/Lymphatic: Aortoiliac atherosclerosis. No adenopathy or aneurysm. Reproductive: Prior hysterectomy.  No adnexal masses. Other: Moderate free fluid in the pelvis, unchanged. Musculoskeletal: No acute bony abnormality. IMPRESSION: Essentially stable CT since 2 days prior. Small bilateral pleural effusions with compressive atelectasis. Numerous metastases within the liver, stable. Perihepatic fluid collections are stable. Drainage catheter within a right perihepatic fluid collection and in the gallbladder. Pancreatic body mass compatible with known pancreatic malignancy. Pancreatic tail atrophy. Large stool burden in the colon. Aortic atherosclerosis. Moderate free fluid in the pelvis. Electronically Signed   By: KRolm BaptiseM.D.   On: 08/17/2021 02:22   CT ABDOMEN PELVIS W CONTRAST  Result Date: 08/15/2021 CLINICAL DATA:  Briefly, 76year old female with a history of pancreatic tumor, perihepatic fluid collection and acute cholecystitis s/p perihepatic drain and cholecystostomy drain placement. EXAM: CT ABDOMEN AND PELVIS WITH CONTRAST TECHNIQUE: Multidetector CT imaging of the abdomen and pelvis was performed using the standard protocol following bolus administration of intravenous contrast. RADIATION DOSE REDUCTION: This exam was performed according to the departmental dose-optimization program which includes automated exposure control, adjustment of the mA and/or kV according to patient size and/or use of iterative reconstruction technique. CONTRAST:  1076mISOVUE-300 IOPAMIDOL (ISOVUE-300) INJECTION 61% COMPARISON:  IR fluoroscopy, earlier same day.  CT AP, 07/24/2021. FINDINGS: Lower chest: Persistent  small volume bilateral pleural effusions, RIGHT-greater-than LEFT, and adjacent dependent consolidations Hepatobiliary: *Well-positioned percutaneous drainage catheter with near-complete resolution of previously large RIGHT perihepatic fluid collection. *Trace residual collection along the hepatic dome measures up to 5.0 x 1.0 cm, previously  17 cm. *Additional inferomedial perihepatic fluid collection measures 3.0 x 1.7 cm, previously 6 cm *Multifocal hypodense lesions within the LEFT and RIGHT hepatic lobes, largest at hepatic segment VI measuring 3.5 x 2.5 cm, suspicious for hepatic metastases. *Stable positioning of cholecystostomy drain with contracted gallbladder. Metallic biliary stent. No intra or extrahepatic biliary ductal dilation. Pancreas: Normal contrast enhancement of the pancreatic head. Similar ill-defined hypodensity within the body of pancreas and pancreatic tail atrophy. Findings compatible with known pancreatic malignancy Spleen: New area of hypodensity within the inferior splenic pole, in an area measuring 3.0 x 2.0 cm. Adrenals/Urinary Tract: Adrenal glands are unremarkable. RIGHT exophytic and renal cortical renal cysts, unchanged. The LEFT kidney is normal. No renal calculi or hydronephrosis. Bladder is unremarkable. Stomach/Bowel: Small hiatus hernia. Nonobstructed bowel. Severe burden of sigmoid diverticulosis. No evidence of bowel wall thickening, distention, or inflammatory changes. Vascular/Lymphatic: Aortic atherosclerosis without aneurysmal dilation. No enlarged abdominal or pelvic lymph nodes. Reproductive: Status post hysterectomy. No adnexal mass. Other: Small volume of residual ascites within the dependent pelvis. Musculoskeletal: Mild body wall edema. Multilevel spinal degenerative change. No interval osseous abnormality. IMPRESSION: Since CT AP dated 07/24/2021; 1. Well-positioned percutaneous drainage catheter with near-complete resolution of previously large perihepatic free  fluid collection. Small volume residual inferomedial hepatic fluid collection. 2. Stable positioning of cholecystostomy drain with contracted gallbladder. 3. Metallic biliary stent without intra- or extrahepatic biliary ductal dilation. 4. Pancreatic body mass consistent with known malignancy. Increased conspicuity of multifocal hepatic lesions, as above, is suspicious for metastases. 5. New hypodensity within the inferior splenic pole, suspicious for regional hypoperfusion and splenic infarct. 6. Bilateral small volume pleural effusions, greater on RIGHT. Additional incidental, chronic and senescent findings as above. These results will be called to the ordering clinician or representative by the Radiologist Assistant, and communication documented in the PACS or [employer]. Electronically Signed   By: Michaelle Birks M.D.   On: 08/15/2021 17:41   DG Sinus/Fist Tube Chk-Non GI  Result Date: 08/15/2021 CLINICAL DATA:  Briefly, 76 year old female with a history of pancreatic tumor, perihepatic fluid collection and acute cholecystitis s/p perihepatic drain and cholecystostomy drain placement. EXAM: CHOLECYSTOSTOMY DRAIN INJECTION COMPARISON:  CT AP, concurrent and 07/24/2021.  IR CT, 07/25/2021. CONTRAST:  40 mL Omnipaque 300-administered via the existing cholecystostomy drain. FLUOROSCOPY TIME:  Fluoroscopic dose; 11.3 mGy TECHNIQUE: The patient was positioned supine on the fluoroscopy table. A preprocedural spot fluoroscopic image was obtained of the RIGHT upper quadrant and the existing percutaneous cholecystostomy drainage catheter. Multiple spot fluoroscopic and radiographic images were obtained following the injection of a small amount of contrast via the existing percutaneous cholecystostomy catheter. A contrast injection for the perihepatic drainage catheter was not performed. Procedure performed by Pasty Spillers, IR PA FINDINGS: Cholecystostomy drain injection without evidence of cystic duct patency.  Metallic biliary stent. IMPRESSION: No fluoroscopic evidence of cystic duct patency. The cholecystostomy drain was therefore LEFT in place. Electronically Signed   By: Michaelle Birks M.D.   On: 08/15/2021 16:41   IR Radiologist Eval & Mgmt  Result Date: 08/15/2021 Please refer to notes tab for details about interventional procedure. (Op Note)  DG CHEST PORT 1 VIEW  Result Date: 07/27/2021 CLINICAL DATA:  Shortness of breath, chest congestion EXAM: PORTABLE CHEST 1 VIEW COMPARISON:  Chest radiograph 07/24/2021 FINDINGS: The right chest wall port is stable in position. There is a new pigtail catheter in the right upper quadrant. The cardiomediastinal silhouette is stable. The right pleural effusion has decreased in size, with  improved aeration of the right lower lobe following thoracentesis. Residual patchy opacities in the perihilar region may reflect atelectasis. There is no appreciable pneumothorax. The left lung is clear. There is no significant left effusion. There is no left pneumothorax. The bones are stable. IMPRESSION: Decreased size of the right pleural effusion with improved aeration of the right base following thoracentesis. No appreciable pneumothorax. Electronically Signed   By: Valetta Mole M.D.   On: 07/27/2021 09:46   CT IMAGE GUIDED DRAINAGE BY PERCUTANEOUS CATHETER  Result Date: 07/25/2021 CLINICAL DATA:  History of pancreatic carcinoma and development large right-sided perihepatic/subcapsular fluid collection. EXAM: CT GUIDED CATHETER DRAINAGE OF PERIHEPATIC ABSCESS ANESTHESIA/SEDATION: Moderate (conscious) sedation was employed during this procedure. A total of Versed 1.0 mg and Fentanyl 75 mcg was administered intravenously by radiology nursing. Moderate Sedation Time: 18 minutes. The patient's level of consciousness and vital signs were monitored continuously by radiology nursing throughout the procedure under my direct supervision. PROCEDURE: The procedure, risks, benefits, and  alternatives were explained to the patient. Questions regarding the procedure were encouraged and answered. The patient understands and consents to the procedure. A time out was performed prior to initiating the procedure. CT was performed through the upper abdomen in a supine position. The abdominal wall was prepped with chlorhexidine in a sterile fashion, and a sterile drape was applied covering the operative field. A sterile gown and sterile gloves were used for the procedure. Local anesthesia was provided with 1% Lidocaine. An 18 gauge trocar needle was advanced into a right lateral perihepatic/subcapsular fluid collection. After confirming needle tip position, a guidewire was advanced into the collection. The needle was removed and the tract dilated over the wire. A 12 French percutaneous drainage catheter was advanced over the wire. Catheter position was confirmed by CT. A fluid sample was withdrawn and sent for culture analysis. The catheter was attached to suction bulb drainage and secured at the skin with a Prolene retention suture and adhesive StatLock device. COMPLICATIONS: None FINDINGS: Large right-sided perihepatic fluid collection again noted. Aspiration yielded clear yellow fluid. After drainage catheter placement there is abundant return of fluid. IMPRESSION: CT-guided percutaneous catheter drainage of large right lateral perihepatic/subcapsular fluid collection. A sample of clear yellow fluid was sent for culture analysis. A 12 French drainage catheter was placed and attached to suction bulb drainage. RADIATION DOSE REDUCTION: This exam was performed according to the departmental dose-optimization program which includes automated exposure control, adjustment of the mA and/or kV according to patient size and/or use of iterative reconstruction technique. Electronically Signed   By: Aletta Edouard M.D.   On: 07/25/2021 11:43   VAS [place] LOWER EXTREMITY VENOUS (DVT)  Result Date: 07/24/2021  Lower  Venous DVT Study Patient Name:  DANAH REINECKE [facility]  Date of Exam:   07/24/2021 Medical Rec #: [id number]          Accession #:    [id number] Date of Birth: [birth year]          Patient Gender: F Patient Age:   31 years Exam Location:  [facility] Procedure:      VAS [place] LOWER EXTREMITY VENOUS (DVT) Referring Phys: Hosie Poisson --------------------------------------------------------------------------------  Indications: Pulmonary embolism.  Risk Factors: Cancer pancreatic - on chemotherapy. Limitations: Compressions contraindicated in presence of proximal obstruction. Comparison Study: No prior venous studies. CTA chest 07-24-2021 positive for                   pulmonary embolism. Performing Technologist: Darlin Coco RDMS, RVT  Examination Guidelines: A complete evaluation includes B-mode imaging, spectral Doppler, color Doppler, and power Doppler as needed of all accessible portions of each vessel. Bilateral testing is considered an integral part of a complete examination. Limited examinations for reoccurring indications may be performed as noted. The reflux portion of the exam is performed with the patient in reverse Trendelenburg.  +---------+---------------+---------+-----------+----------+--------------+ RIGHT    CompressibilityPhasicitySpontaneityPropertiesThrombus Aging +---------+---------------+---------+-----------+----------+--------------+ CFV      Partial        Yes      Yes                  Acute          +---------+---------------+---------+-----------+----------+--------------+ SFJ      Full                                                        +---------+---------------+---------+-----------+----------+--------------+ FV Prox                 Yes      Yes                                 +---------+---------------+---------+-----------+----------+--------------+ FV Mid                  Yes      Yes                                  +---------+---------------+---------+-----------+----------+--------------+ FV Distal               Yes      Yes                                 +---------+---------------+---------+-----------+----------+--------------+ PFV                     Yes      Yes                                 +---------+---------------+---------+-----------+----------+--------------+ POP      Full           Yes      Yes                                 +---------+---------------+---------+-----------+----------+--------------+ PTV                     Yes      Yes                                 +---------+---------------+---------+-----------+----------+--------------+ PERO                    Yes      Yes                                 +---------+---------------+---------+-----------+----------+--------------+   +---------+---------------+---------+-----------+----------+--------------+ LEFT  CompressibilityPhasicitySpontaneityPropertiesThrombus Aging +---------+---------------+---------+-----------+----------+--------------+ CFV      Partial        Yes      Yes                  Acute          +---------+---------------+---------+-----------+----------+--------------+ SFJ      Full                                                        +---------+---------------+---------+-----------+----------+--------------+ FV Prox  Full                                                        +---------+---------------+---------+-----------+----------+--------------+ FV Mid                  Yes      Yes                                 +---------+---------------+---------+-----------+----------+--------------+ FV Distal               Yes      Yes                                 +---------+---------------+---------+-----------+----------+--------------+ PFV      Partial        Yes      Yes                  Acute           +---------+---------------+---------+-----------+----------+--------------+ POP      Full           Yes      Yes                                 +---------+---------------+---------+-----------+----------+--------------+ PTV                     Yes      Yes                                 +---------+---------------+---------+-----------+----------+--------------+ PERO                    Yes      Yes                                 +---------+---------------+---------+-----------+----------+--------------+     Summary: RIGHT: - Findings consistent with acute deep vein thrombosis involving the right common femoral vein. - No cystic structure found in the popliteal fossa.  - Common femoral vein obstruction doesn't appear to extend above inguinal ligament.  LEFT: - Findings consistent with acute deep vein thrombosis involving the left common femoral vein, and left proximal profunda vein. - No cystic structure found in the popliteal fossa.  - The common femoral vein obstruction does not appear  to extend above inguinal ligament.  *See table(s) above for measurements and observations. Electronically signed by Deitra Mayo MD on 07/24/2021 at 3:12:46 PM.    Final      Future Appointments  Date Time Provider [place]  08/28/2021  1:20 PM [facility] CT 1 [facility]  08/28/2021  2:00 PM [facility] DG 1 ([place]) [facility]  08/28/2021  2:00 PM [facility] IR [facility]  08/29/2021  3:00 PM Mignon Pine, DO [facility]  10/17/2021  3:00 PM [name], MD [facility]  12/10/2021 11:45 AM [facility]      LOS: 3 days

## 2021-08-23 NOTE — Procedures (Addendum)
Patient Name: KYRIANNA BARLETTA  MRN: [id number]  Epilepsy Attending: Lora Havens  Referring Physician/Provider: Eugenie Filler, MD  Date: 08/23/2021 Duration: 28.24 mins  Patient history: 76yo F with ams and continuous jerking movements of both arms. EEG to evaluate for seizure.  Level of alertness: lethargic   AEDs during EEG study: None  Technical aspects: This EEG study was done with scalp electrodes positioned according to the 10-20 International system of electrode placement. Electrical activity was acquired at a sampling rate of '500Hz'$  and reviewed with a high frequency filter of '70Hz'$  and a low frequency filter of '1Hz'$ . EEG data were recorded continuously and digitally stored.   Description: No clear posterior dominant rhythm was seen. EEG showed continuous generalized polymorphic 3-'5Hz'$  theta-delta slowing. Generalized periodic discharges with triphasic morphology at 1-1.'5Hz'$  were also noted, more prominent when awake/stimulated. Hyperventilation and photic stimulation were not performed.      ABNORMALITY - Periodic discharges with triphasic morphology, generalized ( GPDs) - Continuous slow, generalized   IMPRESSION: This study showed generalized periodic discharges with triphasic morphology. This EEG pattern is on the ictal-interictal continuum. However the morphology, frequency and reactivity to stimulation is more commonly indicative of toxic-metabolic causes like hyperammonemia, cefepime toxicity. Additionally there is moderate to severe diffuse encephalopathy. No seizures were seen throughout the recording.   If patient remains altered, can consider prolonged EEG monitoring.   Dewana Ammirati Barbra Sarks

## 2021-08-23 NOTE — Plan of Care (Signed)
  Problem: Education: Goal: Knowledge of General Education information will improve Description: Including pain rating scale, medication(s)/side effects and non-pharmacologic comfort measures Outcome: Progressing   Problem: Activity: Goal: Risk for activity intolerance will decrease Outcome: Not Progressing   Problem: Nutrition: Goal: Adequate nutrition will be maintained Outcome: Not Progressing   Problem: Coping: Goal: Level of anxiety will decrease Outcome: Not Progressing   Problem: Pain Managment: Goal: General experience of comfort will improve Outcome: Progressing   Problem: Safety: Goal: Ability to remain free from injury will improve Outcome: Progressing   Problem: Skin Integrity: Goal: Risk for impaired skin integrity will decrease Outcome: Progressing

## 2021-08-23 NOTE — Progress Notes (Signed)
PROGRESS NOTE    [name]  XLK:[id number] DOB: 01/24/1946 DOA: 08/20/2021 PCP: [name], MD    No chief complaint on file.   Brief Narrative:  [name] is a 76 y.o. female with medical history significant for acute cholecystitis status post cholecystostomy, history of pancreatic cancer, hypothyroidism, hypertension, generalized anxiety disorder, history of venous thromboembolic disease, recent hospitalization for sepsis with Morganella morganii/E. coli bacteremia who was sent to the hospital as a direct admission for evaluation of poor oral intake and severe dehydration. Patient's daughter states that since her discharge her oral intake has been very poor but over the last several days she has not had anything to eat or drink due to persistent nausea and vomiting despite taking antiemetics as well as poor appetite.  She complains of feeling very weak and tired and has been unable to get out of bed for the last 2 days.  She feels her legs are very heavy and unable to bear her weight.  She denies having any falls. She complains of abdominal pain mostly in the left lumbar area which she rates a 5 x 10 in intensity at its worst.  But denies having any changes in her bowel habits.  She denies having any fever, no chills, no urinary symptoms, no dizziness, no lightheadedness, no chest pain, no shortness of breath, no headache, no blurred vision or focal deficit. Her daughter is concerned about her progressive decline and inability to participate in physical therapy due to weakness.    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   -Patient placed back on gentle hydration for the next 1 to 2 days.   -Encourage oral intake.   -Follow.  Cholecystitis Status post cholecystostomy tube insertion In addition, had perihepatic drain placed Saw IR on 5/31 and plan for capping trial with return for imaging in 2 weeks Her symptoms seem to have worsened after this trial, unclear if this is related? CT 08/16/21 with stable CT, small bilateral effusions, numerous mets within liver, drainage catheter within right perihepatic fluid collection and in the gallbladder, pancreatic body mass c/w known pancreatic malignancy, large stool burden IR consulted to see about reattaching bags. With E. coli and Morganella morganii bacteremia Continue antibiotic therapy with cefepime and Flagyl. Patient with acute metabolic encephalopathy today, pocketing pills and as such we will change oral Flagyl to IV Flagyl until more alert and improvement with oral intake. ID following and appreciate input and recommendations.  Acute metabolic encephalopathy - Patient drowsy, some confusion, alert to self and place only.  Per daughter does not recognize it today. -Patient also noted to have bilateral upper extremity myoclonic jerking motions. -Patient noted to have received Xanax and Remeron at the same time last night as well as oxycodone 1 to 2 hours prior to that. -Check a head CT, check a EEG to rule out seizures. -Check ammonia level. -Discontinue Remeron and decrease oxycodone to 1 tablet every 6 hours as needed. -Consult with neurology for further evaluation  and management.  AKI (acute kidney injury) ([place]) -IV fluids held due to mild swelling noted.   -Swelling likely secondary to severe hypoalbuminemia.   Right hand with ring stuck on ring finger, Dr. Florene Glen discussed with RN  to help with removal Renal US -> without hydro -renal function was initially improving however trending back up likely secondary to poor oral intake.  -Check a UA, urine sodium, urine creatinine.  -Patient with a slight metabolic acidosis and as such we will change IV fluids to bicarb drip at 100 cc an hour.  -Supportive care. -When mental status improves encourage p.o. intake.  Weakness Secondary to poor oral intake as well as multiple comorbid conditions. Possibly related to capping trial? Follow symptoms after this. PT/OT evals  Diet-controlled diabetes mellitus ([place]) 6.3 a1c 06/2021 -Blood glucose level of 93 this morning on lab work.   -Change IV fluids to sodium bicarb with D5 as patient lethargic, poor oral intake.  Essential hypertension -Controlled on current regimen of amlodipine.   -Patient with altered mental status drowsy with poor oral intake and pocketing medications. -Hold amlodipine today and if mental status improves will resume in the next 24 hours if not we will need to place on IV Lopressor for blood pressure control. -Follow.  Acquired hypothyroidism Stable Continue Synthroid Outpatient follow-up.  Decubitus skin ulcer     Protein-calorie malnutrition, severe ([place]) - Patient with severe protein calorie malnutrition with albumin level < 1.5. -Likely secondary to metastatic pancreatic cancer in the setting of poor oral intake secondary to nausea. -Placed on scheduled Zofran 3 times daily for the next 24 hours. -Status post IV albumin every 6 hours x24 hours. -Encourage oral intake. -Due to acute metabolic encephalopathy with drowsiness and lethargy with some myoclonic jerks we will discontinue Remeron. -Supportive care.  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 Benay Spice of admission, and per daughter patient seen by Dr. Benay Spice this morning.           DVT prophylaxis: Eliquis Code Status: DNR Family Communication: Updated daughter at bedside. Disposition: Likely home with home health therapies  Status is: Inpatient Remains inpatient appropriate because: Severity of illness   Consultants:  Interventional radiology Infectious disease: Dr. Candiss Norse 08/21/2021 Palliative care: Dr. Rowe Pavy 08/21/2021 Oncology informed via epic of admission. Neurology pending  Procedures:  CT abdomen and pelvis 08/21/2021 Chest x-ray 08/20/2021 Renal ultrasound 08/21/2021 CT head 08/23/2021    Antimicrobials:  Oral Flagyl 08/20/2021>>>> IV Flagyl 08/23/2021>>>> IV cefepime 08/21/2021>>>>    Subjective: Patient drowsy.  Lethargic.  Patient noted to have some bilateral upper myoclonic jerking motions.  Alert to self and place only.  Per daughter patient does not recognize her today.  Per RN patient pocketing pills and not initiating swallowing. Per RN daughter requesting palliative care to reassess.  .  Objective: Vitals:   08/22/21 1638 08/22/21 2015 08/23/21 0635 08/23/21 0810  BP: 131/80 (!) 145/76 (!) 150/83 (!) 148/81  Pulse: 89 85 98 (!) 102  Resp: '20 18 18 18  '$ Temp: 98.1 F (36.7 C) 98.3 F (36.8 C) 98.4 F (36.9 C) 98.3 F (36.8 C)  TempSrc: Oral Oral Oral Oral  SpO2: 100% 99% 96% 97%  Weight:      Height:        Intake/Output Summary (Last 24 hours) at  08/23/2021 1511 Last data filed at 08/23/2021 1100 Gross per 24 hour  Intake 90.96 ml  Output 430 ml  Net -339.04 ml   Filed Weights   08/20/21 2200  Weight: 72.8 kg    Examination:  General exam: Drowsy.  Lethargic.  Dry mucous membranes.  Bilateral upper extremities with myoclonic jerking motions and tremors. Respiratory system: CTA B anterior lung fields.  No wheezes, no crackles, no rhonchi.  Normal respiratory effort.    Cardiovascular system: Regular rate and rhythm no murmurs rubs or gallops.  No JVD.  No lower extremity edema. Gastrointestinal system: Abdomen is nondistended, soft and nontender. No organomegaly or masses felt. Normal bowel sounds heard.  Cholecystostomy tube noted right upper quadrant with drain in place.  Right hepatic drain also noted in place. Central nervous system: Patient drowsy.  Alert to self and place only.  Moving extremities spontaneously.  Bilateral upper extremities with myoclonic jerking motions/tremors.   Extremities: Bilateral upper extremities with myoclonic jerking motions/tremors. Skin: No rashes, lesions or ulcers Psychiatry: Judgement and insight appear poor. Mood & affect flat.    Data Reviewed:   CBC: Recent Labs  Lab 08/16/21 2228 08/20/21 1122 08/21/21 0317 08/22/21 0425 08/23/21 0456  WBC 9.5 12.5* 8.4 7.3 4.9  NEUTROABS 6.0  --   --  4.7 3.4  HGB 11.7* 13.1 10.7* 10.3* 9.2*  HCT 37.1 40.0 32.6* 31.6* 28.5*  MCV 98.7 94.6 96.7 97.2 98.3  PLT 161 272 160 146* 103*    Basic Metabolic Panel: Recent Labs  Lab 08/16/21 2228 08/20/21 1122 08/21/21 0317 08/22/21 0425 08/23/21 0456  NA 133* 133* 134* 132* 134*  K 3.8 4.0 3.9 3.9 3.5  CL 106 104 108 106 111  CO2 21* 19* 19* 19* 18*  GLUCOSE 134* 153* 116* 91 93  BUN 16 31* 35* 38* 39*  CREATININE 0.69 1.05* 1.13* 1.32* 1.48*  CALCIUM 8.3* 8.6 8.1* 8.2* 8.6*  MG  --   --   --  2.1  --   PHOS  --   --   --  4.6  --     GFR: Estimated Creatinine Clearance: 30 mL/min (A) (by C-G formula based on SCr of 1.48 mg/dL (H)).  Liver Function Tests: Recent Labs  Lab 08/16/21 2228 08/21/21 0317 08/22/21 0425 08/23/21 0456  AST 31 34 36 205*  ALT '11 13 14 '$ 35  ALKPHOS 267* 340* 340* 637*  BILITOT 0.5 0.6 0.6 1.6*  PROT 5.7* 5.3* 5.2* 5.3*  ALBUMIN 1.7* <1.5* <1.5* 2.6*    CBG: No results for input(s): "GLUCAP" in the last 168 hours.   No results found for this or any previous visit (from the  past 240 hour(s)).       Radiology Studies: CT HEAD WO CONTRAST (5MM)  Result Date: 08/23/2021 CLINICAL DATA:  Altered mental status EXAM: CT HEAD WITHOUT CONTRAST TECHNIQUE: Contiguous axial images were obtained from the base of the skull through the vertex without intravenous contrast. RADIATION DOSE REDUCTION: This exam was performed according to the departmental dose-optimization program which includes automated exposure control, adjustment of the mA and/or kV according to patient size and/or use of iterative reconstruction technique. COMPARISON:  06/01/2016 FINDINGS: Brain: No acute intracranial findings are seen in noncontrast CT brain. There are no signs of bleeding. Cortical sulci are prominent. There is decreased density in the periventricular white matter. Vascular: Unremarkable. Skull: Unremarkable. Sinuses/Orbits: Unremarkable. Other: No significant interval changes are noted. IMPRESSION: No acute intracranial findings are seen in noncontrast CT  brain. Atrophy. Small-vessel disease. Electronically Signed   By: Elmer Picker M.D.   On: 08/23/2021 13:11   CT ABDOMEN PELVIS W CONTRAST  Result Date: 08/21/2021 CLINICAL DATA:  Cholecystitis status post cholecystostomy tube placement. History of pancreatic cancer. EXAM: CT ABDOMEN AND PELVIS WITH CONTRAST TECHNIQUE: Multidetector CT imaging of the abdomen and pelvis was performed using the standard protocol following bolus administration of intravenous contrast. RADIATION DOSE REDUCTION: This exam was performed according to the departmental dose-optimization program which includes automated exposure control, adjustment of the mA and/or kV according to patient size and/or use of iterative reconstruction technique. CONTRAST:  158m OMNIPAQUE IOHEXOL 300 MG/ML  SOLN COMPARISON:  CT abdomen pelvis dated August 16, 2021. FINDINGS: Lower chest: Unchanged moderate right and small left pleural effusions with adjacent atelectasis in both posterior lower  lobes. Hepatobiliary: Multiple hypodense liver lesions are unchanged. Right perihepatic drainage catheter remains in place without significant residual perihepatic fluid collection. 2.2 cm posteroinferior perihepatic fluid collection is unchanged (series 3, image 20). New small amount of pneumobilia in the left liver. Multiple gallstones again noted with slightly increased distention of the gallbladder status post cholecystostomy tube placement. Unchanged common bile duct stent. Pancreas: Unchanged hypodense mass in the pancreatic neck with atrophy of the body and tail. Spleen: Unchanged wedge-shaped hypodensity in the inferior spleen, likely a small infarct. Normal in size. Adrenals/Urinary Tract: Adrenal glands are unremarkable. Unchanged small right renal simple cysts. No follow-up imaging is recommended. No renal calculi or hydronephrosis. The bladder is distended with excreted contrast. Stomach/Bowel: Unchanged small hiatal hernia. The stomach is otherwise within normal limits. No bowel wall thickening, distention, or surrounding inflammatory changes. Scattered mild colonic diverticulosis. Similar moderate stool burden throughout the colon. Normal appendix. 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 Aortoiliac atherosclerotic vascular disease. No enlarged abdominal or pelvic lymph nodes. Reproductive: Status post hysterectomy. No adnexal masses. Other: Decreased small volume ascites in the pelvis. No pneumoperitoneum. Musculoskeletal: No acute or significant osseous findings. Unchanged anasarca. IMPRESSION: 1. Status post cholecystostomy tube placement with slightly increased distention of the gallbladder compared to prior study. Correlate with tube function. 2. Right perihepatic drainage catheter remains in place without significant residual perihepatic fluid collection around the  drain. Unchanged 2.2 cm posteroinferior perihepatic fluid collection separate from the drain. 3. 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 4. Decreased small volume ascites in the pelvis. 5. Unchanged moderate right and small left pleural effusions. 6. 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 7. Aortic Atherosclerosis (ICD10-I70.0). Electronically Signed   By: WTitus DubinM.D.   On: 08/21/2021 17:58   [place]NAL  Result Date: 08/21/2021 CLINICAL DATA:  Acute kidney injury EXAM: RENAL / URINARY TRACT ULTRASOUND COMPLETE COMPARISON:  CT abdomen 08/16/2021 FINDINGS: Right Kidney: Renal measurements: 9.5 by 4.0 by 5.0 cm = volume: 102 mL. Mildly hyperechoic relative to the liver. No solid mass or hydronephrosis visualized. A right kidney upper pole cyst measures 2.0 by 3.0 by 2 2.6 cm and appears simple. Left Kidney: Renal measurements: 9.7 by 4.0 by 4.8 cm = volume: 99 mL. Mildly hyperechoic relative to the spleen. No mass or hydronephrosis visualized. Bladder: Appears normal for degree of bladder distention. A left ureteral jet is visualized. Ascites in the lower abdomen. IMPRESSION: 1. Bilateral renal parenchymal hyperechogenicity suggesting chronic medical renal disease. 2. Right  kidney upper pole simple appearing cyst. 3. A left ureteral jet is visualized in the urinary bladder. Right ureteral jet not seen, although this may simply be incidental. 4. Pelvic ascites. Electronically Signed   By: Van Clines M.D.   On: 08/21/2021 16:00        Scheduled Meds:  amLODipine  5 mg Oral Daily   apixaban  5 mg Oral BID   bisacodyl  10 mg Rectal Once   bisacodyl  10 mg Rectal Daily   levothyroxine  125 mcg Oral QAC breakfast   multivitamin with minerals  1 tablet Oral Daily   ondansetron (ZOFRAN) IV  4 mg Intravenous Q8H   pantoprazole  40 mg Oral Daily   polyethylene  glycol  17 g Oral Daily   sodium chloride flush  5 mL Intracatheter Daily   sodium chloride flush  5 mL Intracatheter Daily   Continuous Infusions:  ceFEPime (MAXIPIME) IV 2 g (08/23/21 1047)   metronidazole     sodium bicarbonate 150 mEq in dextrose 5 % 1,150 mL infusion 100 mL/hr at 08/23/21 1050     LOS: 3 days    Time spent: 45 minutes    Irine Seal, MD [facility]   To contact the attending provider between 7A-7P or the covering provider during after hours 7P-7A, please log into the web site [email or web address] and access using universal [place] password for that web site. If you do not have the password, please call the hospital operator.  08/23/2021, 3:11 PM

## 2021-08-23 NOTE — Progress Notes (Signed)
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 ([place])   Weakness   Decubitus skin ulcer   Acute metabolic encephalopathy   Cholecystitis   VTE (venous thromboembolism)   AKI (acute kidney injury) ([place])   Protein-calorie malnutrition, severe ([name])          Assessment: 76 YF direct admitted due to N/V and decreased PO intake   # Dehydration # Weakness 2/2 decrease PO intake  # Hx of Subcapsular fluid collection adjacent to the liver(17.6 cm) with Cx NG # Hx of Acute cholecystitis SP cholecystostomy  with IR on 5/5 with Cx with Morganella morganii/B ovatus and E coli  # Hx of  Morganella morganii and E coli  bacteremia on 5/3  #Adenocarcinoma of pancrease, 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 SP CT guided drainage of perihepatic fluid 5/10. Cx no growth. She was discharged on vanc and cefepime with EOT 08/20/21 -CT on 6/2 showed near complete resolution of previous large perihepatic free fluid collection. Small volume residual inferomedial hepatic fluid collection. Plan was for capping trial and 2 week follow-up with IR> She presented ot ED on 6/1 with abdominal pain. CT showed rt perihepatic drainage catheter in place with near complete resolution of perihepatic fluid collection. Numerous liver mets.  Inferior perihepatic fluid collection again noted, measuring 2.3 cm,stable.  -Seen by ID on 6/5, and found to have PO intolerance. She was direct admitted for IVF(did not want to fo to ED). She noted abdominal pain is getting  better. -6/6 she was asking to eat solid food. She ended up vomiting after eating part of a  sandwich.  -6/8 pt is much less alert today, able to answer questions.   Recommendations:  -Continue cefepime and metronidazole -Monitor for PO tolerance, management per primary. IR consulted per bag reattachment. There is dark fluid in bag this AM which daughter was concerned about. If pt's PO tolerance does not improve by tomorrow then consider repeat imaging -When she is able to tolerate PO switch bactrim on discharge with ID follow-up on 6/14  Microbiology:   Antibiotics: Vancomycin 5/3 Cefepime 5/3, 5/6-p Aztreonam 5/3, Ceftriaxone 5/4-5/6 Metronidazole 5/3-p     Cultures:   Prior hospitalization: 5/3 blood Cx+ 2/2 Morgenella Morgani and 1/2 Ecoli 5/5 GB abscess Cx+ Ecoli, morganella morganii and bacteroides ovatus(beta lactamase positive) 5/10liver abscess perihepatic collection with NG  SUBJECTIVE: Resting in bed. Less alert. Right hand is shaking  Review of Systems: Review of Systems  All other systems reviewed and are negative.    Scheduled Meds:  amLODipine  5 mg Oral Daily   apixaban  5 mg Oral BID   bisacodyl  10 mg Rectal Once   bisacodyl  10 mg Rectal Daily   levothyroxine  125 mcg Oral QAC breakfast   metroNIDAZOLE  500 mg Oral BID   multivitamin with minerals  1 tablet Oral Daily   ondansetron (ZOFRAN) IV  4 mg Intravenous Q8H   pantoprazole  40 mg Oral Daily   polyethylene glycol  17 g Oral Daily   sodium chloride flush  5 mL Intracatheter Daily   sodium chloride flush  5 mL Intracatheter Daily   Continuous Infusions:  ceFEPime (MAXIPIME) IV 2 g (08/23/21 1047)   sodium bicarbonate 150 mEq in dextrose 5 % 1,150 mL infusion 100 mL/hr at 08/23/21 1050   PRN Meds:.ALPRAZolam, docusate sodium, iohexol, ondansetron **OR** ondansetron (ZOFRAN) IV, oxyCODONE-acetaminophen, sodium chloride flush Allergies  Allergen Reactions   Penicillins Hives and Other (See  Comments)    Tolerated Ceftriaxone 2023  Has patient had a PCN reaction causing immediate rash, facial/tongue/throat swelling, SOB or lightheadedness with hypotension: No Has patient had a PCN reaction causing severe rash involving mucus membranes or skin necrosis: No Has patient had a PCN reaction that required hospitalization No Has patient had a PCN reaction occurring within the last 10 years: No If all of the above answers are "NO", then may proceed with Cephalosporin use.    OBJECTIVE: Vitals:   08/22/21 1638 08/22/21 2015 08/23/21 0635 08/23/21 0810  BP: 131/80 (!) 145/76 (!) 150/83 (!) 148/81  Pulse: 89 85 98 (!) 102  Resp: '20 18 18 18  '$ Temp: 98.1 F (36.7 C) 98.3 F (36.8 C) 98.4 F (36.9 C) 98.3 F (36.8 C)  TempSrc: Oral Oral Oral Oral  SpO2: 100% 99% 96% 97%  Weight:      Height:       Body mass index is 28.43 kg/m.  Physical Exam Constitutional:      Appearance: Normal appearance.  HENT:     Head: Normocephalic and atraumatic.     Right Ear: Tympanic membrane normal.     Left Ear: Tympanic membrane normal.     Nose: Nose normal.     Mouth/Throat:     Mouth: Mucous membranes are moist.  Eyes:     Extraocular Movements: Extraocular movements intact.     Conjunctiva/sclera: Conjunctivae normal.     Pupils: Pupils are equal, round, and reactive to light.  Cardiovascular:     Rate and Rhythm: Normal rate and regular rhythm.     Heart sounds: No murmur heard.    No friction rub. No gallop.  Pulmonary:     Effort: Pulmonary effort is normal.     Breath sounds: Normal breath sounds.  Abdominal:     General: Abdomen is flat.     Palpations: Abdomen is soft.     Comments: ABD drains in place  Musculoskeletal:        General: Normal range of motion.  Skin:    General: Skin is warm and dry.  Neurological:     General: No focal deficit present.     Mental Status: She is oriented to person, place, and time.  Psychiatric:        Mood and Affect: Mood normal.        Lab Results Lab Results  Component Value Date   WBC 4.9 08/23/2021   HGB 9.2 (L) 08/23/2021   HCT 28.5 (L) 08/23/2021   MCV 98.3 08/23/2021   PLT 103 (L) 08/23/2021    Lab Results  Component Value Date   CREATININE 1.48 (H) 08/23/2021   BUN 39 (H) 08/23/2021   NA 134 (L) 08/23/2021   K 3.5 08/23/2021   CL 111 08/23/2021   CO2 18 (L) 08/23/2021    Lab Results  Component Value Date   ALT 35 08/23/2021   AST 205 (H) 08/23/2021   ALKPHOS 637 (H) 08/23/2021   BILITOT 1.6 (H) 08/23/2021        Laurice Record, MD [facility] 08/23/2021, 2:28 PM

## 2021-08-23 NOTE — Progress Notes (Signed)
Patient is refusing to swallow medicines, keeping them in her mouth.  Patient unable to take a sip of water at this time due to weakness.  Dr. Grandville Silos made aware.

## 2021-08-23 NOTE — Progress Notes (Signed)
Patient's daughter [name] refused catheterization for urine collection.  Dr. Grandville Silos made aware.

## 2021-08-23 NOTE — Progress Notes (Signed)
SLP Cancellation Note  Patient Details Name: [name] MRN: [id number] DOB: [birth year]   Cancelled treatment:       Reason Eval/Treat Not Completed: Attempted to see pt for a swallow eval. Her husband says that she is definitely not swallowing today. Note CT Head performed today and change in mental status observed. Husband asks that we defer our evaluation for today. Will f/u as able.    Osie Bond., M.A. [place] Office [phone]  Secure chat preferred  08/23/2021, 1:42 PM

## 2021-08-23 NOTE — Progress Notes (Signed)
EEG complete - results pending 

## 2021-08-23 NOTE — Progress Notes (Signed)
Supervising Physician: Arne Cleveland  Patient Status:  [name] - In-pt  Chief Complaint:  Dehydration Acute cholecystitis SP cholecystostomy with IR on 5/5 SP CT guided drainage of perihepatic fluid 5/10  Subjective:  Sleeping, spouse and sister at bedside.  Spoke with daughter by phone.  Right hand trembling.  Allergies: Penicillins  Medications: Prior to Admission medications   Medication Sig Start Date End Date Taking? Authorizing Provider  ALPRAZolam (XANAX) 0.25 MG tablet Take 0.25 mg by mouth 2 (two) times daily as needed for anxiety. 08/02/21  Yes [provider]  amLODipine (NORVASC) 5 MG tablet Take 5 mg by mouth daily.   Yes [provider]  apixaban (ELIQUIS) 5 MG TABS tablet Take 5 mg by mouth 2 (two) times daily.   Yes [provider]  ceFEPIme (MAXIPIME) 2 g injection Inject 2 g into the muscle 2 (two) times daily. 08/20/21  Yes [provider]  cyanocobalamin (,VITAMIN B-12,) 1000 MCG/ML injection Inject 1 mL (1,000 mcg total) into the muscle every 30 (thirty) days. 01/11/21  Yes [name], MD  docusate sodium (COLACE) 100 MG capsule Take 100 mg by mouth 2 (two) times daily as needed (constipation).   Yes [provider]  famotidine (PEPCID) 20 MG tablet Take 1 tablet (20 mg total) by mouth daily. 08/02/21  Yes Hosie Poisson, MD  levothyroxine (SYNTHROID) 125 MCG tablet TAKE 1 TABLET ON AN EMPTY STOMACH IN THE MORNING Patient taking differently: Take 125 mcg by mouth daily before breakfast. 06/04/21  Yes [name], MD  lidocaine-prilocaine (EMLA) cream APPLY 1 APPLICATION TOPICALLY AS NEEDED. Patient taking differently: Apply 1 application. topically daily as needed (numbing). 07/04/21  Yes Ladell Pier, MD  omeprazole (PRILOSEC) 20 MG capsule TAKE 1 CAPSULE BY MOUTH EVERY DAY Patient taking differently: Take 20 mg by mouth at bedtime. 04/18/21  Yes [name], MD  oxyCODONE (OXY IR/ROXICODONE) 5 MG  immediate release tablet Take 5 mg by mouth 3 (three) times daily as needed for moderate pain or severe pain. 08/17/21  Yes [provider]  oxyCODONE-acetaminophen (PERCOCET/ROXICET) 5-325 MG tablet Take 1-2 tablets by mouth every 6 (six) hours as needed for severe pain. 08/17/21  Yes Horton, Barbette Hair, MD  polyethylene glycol powder (GLYCOLAX/MIRALAX) 17 GM/SCOOP powder Take 17 g by mouth daily. Patient taking differently: Take 17 g by mouth daily as needed for moderate constipation. 08/02/21  Yes Hosie Poisson, MD  prochlorperazine (COMPAZINE) 10 MG tablet Take 1 tablet (10 mg total) by mouth every 6 (six) hours as needed (Nausea or vomiting). 09/12/20  Yes Truitt Merle, MD  ACCU-CHEK AVIVA PLUS test strip As needed 05/09/21   [name], MD  Accu-Chek Softclix Lancets lancets As needed 04/23/21   [name], MD  Alcohol Swabs PADS USE AS DIRECTED 05/18/21   [name], MD  apixaban (ELIQUIS) 5 MG TABS tablet Take 2 tablets (10 mg total) by mouth 2 (two) times daily for 1 day. 08/01/21 08/20/21  Hosie Poisson, MD  apixaban (ELIQUIS) 5 MG TABS tablet Take 2 tablets (10 mg total) by mouth 2 (two) times daily for 1 day. then Take 1 tablet (5 mg total) by mouth 2 (two) times daily. 08/02/21   Hosie Poisson, MD  Blood Glucose Monitoring Suppl (ACCU-CHEK GUIDE) w/Device KIT As needed 05/11/21   [name], MD  calcium carbonate (TUMS EX) 750 MG chewable tablet Chew 1 tablet by mouth daily.    [provider]  LORazepam (ATIVAN) 0.5 MG tablet Take 0.5 mg by mouth as needed for anxiety.    [provider]  metroNIDAZOLE (FLAGYL) 500 MG tablet Take 500 mg by mouth 2 (two) times daily. Last dose will be 08/20/21    [provider]  Multiple Vitamin (MULTIVITAMIN WITH MINERALS) TABS tablet Take 1 tablet by mouth daily. 08/02/21   Hosie Poisson, MD  sodium chloride 0.9 % infusion Inject into the vein. 08/13/21   [provider]  traMADol (ULTRAM) 50 MG tablet Take 1  tablet (50 mg total) by mouth every 6 (six) hours as needed for moderate pain or severe pain. Patient not taking: Reported on 08/09/2021 08/01/21   Hosie Poisson, MD     Vital Signs: BP (!) 148/81 (BP Location: Right Arm)   Pulse (!) 102   Temp 98.3 F (36.8 C) (Oral)   Resp 18   Ht '5\' 3"'  (1.6 m)   Wt 160 lb 7.9 oz (72.8 kg)   SpO2 97%   BMI 28.43 kg/m   Physical Exam Vitals reviewed.  Constitutional:      General: She is sleeping.     Appearance: She is ill-appearing.  Cardiovascular:     Rate and Rhythm: Tachycardia present.  Pulmonary:     Effort: Pulmonary effort is normal.  Abdominal:     General: Abdomen is flat.     Palpations: Abdomen is soft.  Skin:    Coloration: Skin is pale.   Drain Location: RUQ Size: Fr size: 10 Fr, 12 Fr Date of placement: 5/5, 5/10  Currently to: Drain collection device: gravity 24 hour output:  Output by Drain (mL) 08/21/21 0700 - 08/21/21 1459 08/21/21 1500 - 08/21/21 2259 08/21/21 2300 - 08/22/21 0659 08/22/21 0700 - 08/22/21 1459 08/22/21 1500 - 08/22/21 2259 08/22/21 2300 - 08/23/21 0659 08/23/21 0700 - 08/23/21 1459 08/23/21 1500 - 08/23/21 1502  Closed System Drain RUQ 10.2 Fr.    50  30    Closed System Drain 1 Right RUQ Other (Comment) 12 Fr.    0  0      Current examination: Both drains flush easily.  Insertion sites unremarkable. Dressed appropriately.  No output of perihepatic drain. Perc chole has scant dark brown fluid in bag.   Imaging: CT HEAD WO CONTRAST (5MM)  Result Date: 08/23/2021 CLINICAL DATA:  Altered mental status EXAM: CT HEAD WITHOUT CONTRAST TECHNIQUE: Contiguous axial images were obtained from the base of the skull through the vertex without intravenous contrast. RADIATION DOSE REDUCTION: This exam was performed according to the departmental dose-optimization program which includes automated exposure control, adjustment of the mA and/or kV according to patient size and/or use of iterative reconstruction  technique. COMPARISON:  06/01/2016 FINDINGS: Brain: No acute intracranial findings are seen in noncontrast CT brain. There are no signs of bleeding. Cortical sulci are prominent. There is decreased density in the periventricular white matter. Vascular: Unremarkable. Skull: Unremarkable. Sinuses/Orbits: Unremarkable. Other: No significant interval changes are noted. IMPRESSION: No acute intracranial findings are seen in noncontrast CT brain. Atrophy. Small-vessel disease. Electronically Signed   By: Elmer Picker M.D.   On: 08/23/2021 13:11   CT ABDOMEN PELVIS W CONTRAST  Result Date: 08/21/2021 CLINICAL DATA:  Cholecystitis status post cholecystostomy tube placement. History of pancreatic cancer. EXAM: CT ABDOMEN AND PELVIS WITH CONTRAST TECHNIQUE: Multidetector CT imaging of the abdomen and pelvis was performed using the standard protocol following bolus administration of intravenous contrast. RADIATION DOSE REDUCTION: This exam was performed according to the departmental  dose-optimization program which includes automated exposure control, adjustment of the mA and/or kV according to patient size and/or use of iterative reconstruction technique. CONTRAST:  133m OMNIPAQUE IOHEXOL 300 MG/ML  SOLN COMPARISON:  CT abdomen pelvis dated August 16, 2021. FINDINGS: Lower chest: Unchanged moderate right and small left pleural effusions with adjacent atelectasis in both posterior lower lobes. Hepatobiliary: Multiple hypodense liver lesions are unchanged. Right perihepatic drainage catheter remains in place without significant residual perihepatic fluid collection. 2.2 cm posteroinferior perihepatic fluid collection is unchanged (series 3, image 20). New small amount of pneumobilia in the left liver. Multiple gallstones again noted with slightly increased distention of the gallbladder status post cholecystostomy tube placement. Unchanged common bile duct stent. Pancreas: Unchanged hypodense mass in the pancreatic neck  with atrophy of the body and tail. Spleen: Unchanged wedge-shaped hypodensity in the inferior spleen, likely a small infarct. Normal in size. Adrenals/Urinary Tract: Adrenal glands are unremarkable. Unchanged small right renal simple cysts. No follow-up imaging is recommended. No renal calculi or hydronephrosis. The bladder is distended with excreted contrast. Stomach/Bowel: Unchanged small hiatal hernia. The stomach is otherwise within normal limits. No bowel wall thickening, distention, or surrounding inflammatory changes. Scattered mild colonic diverticulosis. Similar moderate stool burden throughout the colon. Normal appendix. 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 Aortoiliac atherosclerotic vascular disease. No enlarged abdominal or pelvic lymph nodes. Reproductive: Status post hysterectomy. No adnexal masses. Other: Decreased small volume ascites in the pelvis. No pneumoperitoneum. Musculoskeletal: No acute or significant osseous findings. Unchanged anasarca. IMPRESSION: 1. Status post cholecystostomy tube placement with slightly increased distention of the gallbladder compared to prior study. Correlate with tube function. 2. Right perihepatic drainage catheter remains in place without significant residual perihepatic fluid collection around the drain. Unchanged 2.2 cm posteroinferior perihepatic fluid collection separate from the drain. 3. 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 4. Decreased small volume ascites in the pelvis. 5. Unchanged moderate right and small left pleural effusions. 6. 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 7. Aortic Atherosclerosis (ICD10-I70.0). Electronically Signed   By: WTitus DubinM.D.   On: 08/21/2021 17:58   [place] RENAL  Result Date: 08/21/2021 CLINICAL DATA:  Acute kidney injury EXAM: RENAL / URINARY TRACT ULTRASOUND COMPLETE COMPARISON:  CT abdomen 08/16/2021 FINDINGS: Right Kidney: Renal measurements: 9.5 by 4.0 by 5.0 cm = volume: 102 mL. Mildly hyperechoic relative to the liver. No solid mass or hydronephrosis visualized. A right kidney upper pole cyst measures 2.0 by 3.0 by 2 2.6 cm and appears simple. Left Kidney: Renal measurements: 9.7 by 4.0 by 4.8 cm = volume: 99 mL. Mildly hyperechoic relative to the spleen. No mass or hydronephrosis visualized. Bladder: Appears normal for degree of bladder distention. A left ureteral jet is visualized. Ascites in the lower abdomen. IMPRESSION: 1. Bilateral renal parenchymal hyperechogenicity suggesting chronic medical renal disease. 2. Right kidney upper pole simple appearing cyst. 3. A left ureteral jet is visualized in the urinary bladder. Right ureteral jet not seen, although this may simply be incidental. 4. Pelvic ascites. Electronically Signed   By: WVan ClinesM.D.   On: 08/21/2021 16:00   DG CHEST PORT 1 VIEW  Result Date: 08/20/2021 CLINICAL DATA:  Status post PICC line placement. EXAM: PORTABLE CHEST 1 VIEW COMPARISON:  07/27/2021. FINDINGS: The heart size and mediastinal contours are within  normal limits. There is atherosclerotic calcification of the aorta. Patchy airspace disease is noted at the right lung base. There are questionable small bilateral pleural effusions. No pneumothorax. A right chest port is stable. No PICC line is identified. A stable pigtail catheter is noted in the right upper quadrant. No acute osseous abnormality. IMPRESSION: 1. Small bilateral pleural effusions with atelectasis at the lung bases. 2. No PICC line is identified.  Clinical correlation is recommended. Electronically Signed   By: Brett Fairy M.D.   On: 08/20/2021 23:45    Labs:  CBC: Recent Labs    08/20/21 1122 08/21/21 0317 08/22/21 0425 08/23/21 0456  WBC  12.5* 8.4 7.3 4.9  HGB 13.1 10.7* 10.3* 9.2*  HCT 40.0 32.6* 31.6* 28.5*  PLT 272 160 146* 103*    COAGS: Recent Labs    10/14/20 1457 06/30/21 0939 07/18/21 1503 07/19/21 0445 07/24/21 1306  INR 1.1 1.1 1.2 1.3* 1.2  APTT 45*  --  28  --   --     BMP: Recent Labs    08/16/21 2228 08/20/21 1122 08/21/21 0317 08/22/21 0425 08/23/21 0456  NA 133* 133* 134* 132* 134*  K 3.8 4.0 3.9 3.9 3.5  CL 106 104 108 106 111  CO2 21* 19* 19* 19* 18*  GLUCOSE 134* 153* 116* 91 93  BUN 16 31* 35* 38* 39*  CALCIUM 8.3* 8.6 8.1* 8.2* 8.6*  CREATININE 0.69 1.05* 1.13* 1.32* 1.48*  GFRNONAA >60  --  50* 41* 36*    LIVER FUNCTION TESTS: Recent Labs    08/16/21 2228 08/21/21 0317 08/22/21 0425 08/23/21 0456  BILITOT 0.5 0.6 0.6 1.6*  AST 31 34 36 205*  ALT '11 13 14 ' 35  ALKPHOS 267* 340* 340* 637*  PROT 5.7* 5.3* 5.2* 5.3*  ALBUMIN 1.7* <1.5* <1.5* 2.6*    Assessment and Plan:  Plan: Continue TID flushes with 5 cc NS. Record output Q shift. Dressing changes QD or PRN if soiled.   Discharge planning: Please contact IR APP or on call IR MD prior to patient d/c to ensure appropriate follow up plans are in place. Patient will need to flush drain QD with 5 cc NS, record output QD, dressing changes every 2-3 days or earlier if soiled.   IR will continue to follow - please call with questions or concerns.  Electronically Signed: Pasty Spillers, PA 08/23/2021, 2:40 PM   I spent a total of 15 Minutes at the the patient's bedside AND on the patient's [name] floor or unit, greater than 50% of which was counseling/coordinating care for drain management

## 2021-08-23 NOTE — Progress Notes (Signed)
OT Cancellation Note  Patient Details Name: [name] MRN: [id number] DOB: 1945-07-22   Cancelled Treatment:    Reason Eval/Treat Not Completed: Fatigue/lethargy limiting ability to participate.  Per MD recommendation will hold OT eval at this time and will re-attempt tomorrow if medically stable.   Noam Karaffa OTR/L 08/23/2021, 12:04 PM

## 2021-08-23 NOTE — Assessment & Plan Note (Addendum)
-   Patient noted to be drowsy, confusion, noted to have some myoclonic jerking on 08/23/2021.   -Likely multifactorial secondary to medications including narcotics, benzos in the setting of AKI. -Patient noted to have received Xanax and Remeron at the same time the evening of 08/22/2021, as well as oxycodone 1 to 2 hours prior to that. -Head CT done unremarkable.   -EEG done was negative for seizures.  -Ammonia level within normal limits. -Discontinued Remeron and decreased oxycodone to 1 tablet every 6 hours as needed. -Vitamin B1 level pending. -Cefepime discontinued. -Patient started on high-dose IV thiamine per neurology recommendations. -Patient with clinical improvement, alert, following commands appropriately. -Patient weak with poor oral intake -Neurology consulted, was following but have signed off. -Supportive care.

## 2021-08-23 NOTE — Care Management Important Message (Signed)
Important Message  Patient Details  Name: [name] MRN: [id number] Date of Birth: [birth year]   Medicare Important Message Given:  Yes     Orbie Pyo 08/23/2021, 4:50 PM

## 2021-08-23 NOTE — Consult Note (Addendum)
Neurology Consultation  Reason for Consult: confusion and bilateral upper arm jerking Referring Physician: Dr. Grandville Silos  History is obtained from: medical chart, husband at bedside   HPI: [name] is a 76 y.o. female with past medical history of acute cholecystitis status post cholecystostomy, history of pancreatic cancer, hypothyroidism, DM, HLD, TIA, vit b12 deficiency, hypertension, generalized anxiety and depression disorder, history of venous thromboembolic disease, recent hospitalization for sepsis with Morganella morganii/E. coli bacteremia who was sent to the hospital as a direct admission for evaluation of poor oral intake and severe dehydration. Per H&P notes patient has a progressive decline in status since being discharged with decreased oral intake and persistent nausea and vomiting with c/o feeling very tired and weak. Per notes she is unable to bear weight on legs and they feel very heavy. Per chart review on 5/3 was noted to also have acute encephalopathy due to recent stressors of recent COVID infection, and acute cholecystitis.  This am patient was noted to be very drowsy, confused and unable to recognize family members as well as having bilateral upper extremity jerking movement. Of note patient received Xanax and Remeron at the same time last night as well as oxycodone 1 to 2 hours prior to that. Sister in law at bedside states prior to CT scan patient was having continuous jerking movements of both arms. Neurology called for assistance    ROS:  Unable to obtain due to altered mental status.   Past Medical History:  Diagnosis Date   Anxiety    Arthritis    Cancer ([place])    pancreatic   Depression    Diabetes mellitus without complication ([place])    Hyperlipemia    Hypertension    Thyroid disease    TIA (transient ischemic attack)    Vitamin B12 deficiency 10/20/2019    Family History  Problem Relation Age of Onset   Diabetes Mother    Alzheimer's disease  Mother    Dementia Mother    COPD Father    Emphysema Father    Heart disease Sister    Alcohol abuse Brother    Heart disease Brother    Emphysema Brother    Heart attack Daughter    Cancer Other        breast cancer     Social History:   reports that she quit smoking about 34 years ago. Her smoking use included cigarettes. She has a 10.00 pack-year smoking history. She has never used smokeless tobacco. She reports that she does not currently use alcohol after a past usage of about 21.0 standard drinks of alcohol per week. She reports that she does not use drugs.  Medications  Current Facility-Administered Medications:    ALPRAZolam (XANAX) tablet 0.5 mg, 0.5 mg, Oral, Daily PRN, Agbata, Tochukwu, MD, 0.5 mg at 08/22/21 2213   amLODipine (NORVASC) tablet 5 mg, 5 mg, Oral, Daily, Agbata, Tochukwu, MD, 5 mg at 08/22/21 0953   apixaban (ELIQUIS) tablet 5 mg, 5 mg, Oral, BID, Agbata, Tochukwu, MD, 5 mg at 08/22/21 2213   bisacodyl (DULCOLAX) suppository 10 mg, 10 mg, Rectal, Once, Agbata, Tochukwu, MD   bisacodyl (DULCOLAX) suppository 10 mg, 10 mg, Rectal, Daily, Eugenie Filler, MD, 10 mg at 08/23/21 1051   ceFEPIme (MAXIPIME) 2 g in sodium chloride 0.9 % 100 mL IVPB, 2 g, Intravenous, Q12H, Agbata, Tochukwu, MD, Last Rate: 200 mL/hr at 08/23/21 1047, 2 g at 08/23/21 1047   docusate sodium (COLACE) capsule 100 mg, 100 mg, Oral,  BID PRN, Agbata, Tochukwu, MD   iohexol (OMNIPAQUE) 300 MG/ML solution 100 mL, 100 mL, Intravenous, Once PRN, Boisseau, Hayley, PA   levothyroxine (SYNTHROID) tablet 125 mcg, 125 mcg, Oral, QAC breakfast, Agbata, Tochukwu, MD, 125 mcg at 08/23/21 4827   metroNIDAZOLE (FLAGYL) tablet 500 mg, 500 mg, Oral, BID, Agbata, Tochukwu, MD, 500 mg at 08/22/21 2214   multivitamin with minerals tablet 1 tablet, 1 tablet, Oral, Daily, Agbata, Tochukwu, MD, 1 tablet at 08/21/21 0939   ondansetron (ZOFRAN) tablet 4 mg, 4 mg, Oral, Q6H PRN **OR** ondansetron (ZOFRAN)  injection 4 mg, 4 mg, Intravenous, Q6H PRN, Agbata, Tochukwu, MD, 4 mg at 08/21/21 1748   ondansetron (ZOFRAN) injection 4 mg, 4 mg, Intravenous, Q8H, Eugenie Filler, MD, 4 mg at 08/23/21 0786   oxyCODONE-acetaminophen (PERCOCET/ROXICET) 5-325 MG per tablet 1 tablet, 1 tablet, Oral, Q6H PRN, Eugenie Filler, MD   pantoprazole (PROTONIX) EC tablet 40 mg, 40 mg, Oral, Daily, Agbata, Tochukwu, MD, 40 mg at 08/22/21 0953   polyethylene glycol (MIRALAX / GLYCOLAX) packet 17 g, 17 g, Oral, Daily, Agbata, Tochukwu, MD   sodium bicarbonate 150 mEq in dextrose 5 % 1,150 mL infusion, , Intravenous, Continuous, Eugenie Filler, MD, Last Rate: 100 mL/hr at 08/23/21 1050, New Bag at 08/23/21 1050   sodium chloride flush (NS) 0.9 % injection 10-40 mL, 10-40 mL, Intracatheter, PRN, Agbata, Tochukwu, MD, 10 mL at 08/21/21 0043   sodium chloride flush (NS) 0.9 % injection 5 mL, 5 mL, Intracatheter, Daily, Han, Aimee H, PA-C, 5 mL at 08/23/21 1052   sodium chloride flush (NS) 0.9 % injection 5 mL, 5 mL, Intracatheter, Daily, Han, Aimee H, PA-C, 5 mL at 08/23/21 1052   Exam: Current vital signs: BP (!) 148/81 (BP Location: Right Arm)   Pulse (!) 102   Temp 98.3 F (36.8 C) (Oral)   Resp 18   Ht '5\' 3"'  (1.6 m)   Wt 72.8 kg   SpO2 97%   BMI 28.43 kg/m  Vital signs in last 24 hours: Temp:  [98.1 F (36.7 C)-98.4 F (36.9 C)] 98.3 F (36.8 C) (06/08 0810) Pulse Rate:  [85-102] 102 (06/08 0810) Resp:  [18-20] 18 (06/08 0810) BP: (131-150)/(76-83) 148/81 (06/08 0810) SpO2:  [96 %-100 %] 97 % (06/08 0810)  GENERAL: critically ill, frail elderly woman sleeping in bed in NAD HEENT: - Normocephalic and atraumatic, dry mm LUNGS - Clear to auscultation bilaterally with no wheezes CV - S1S2 RRR, no m/r/g, equal pulses bilaterally. ABDOMEN - Soft, nontender, nondistended with normoactive BS Ext: warm, well perfused, intact peripheral pulses, bilateral no pitting edema to lower legs  NEURO:  Mental  Status: Patient is very lethargic,  and weak does respond to voice and soft touch, she will follow simple commands, will stick out tongue and wiggle toes.  I could only get her to answer Yes, otherwise she did not communicate with me to much Language: Unable to assess Cranial Nerves: PERRL1 mm/sluggish, she blinks to threat bilaterally. Hard to assess due to mental status, will track me occasionally, does not smile, but face appears symmetric, tongue is midline Motor: generalized weakness in all 4 extremities, with assistance bilateral arms can be lifted and held for a few seconds and both slowly drop to the bed. Bilateral grip strength is very weak and almost not present. On my passive movement of her bilateral arms she did have myoclonus very briefly. She is unable to lift her legs on own, but wiggles toes Tone: is  normal and bulk is normal Sensation- appears symmetric  Coordination: Unable to assess  Gait- deferred  Imaging I have reviewed the images obtained:  CT-head No acute intracranial findings Atrophy. Small-vessel disease.  LABS: NA 134--> 132-->134--> 133 on admission  Cr 1.48-->1.32-->1.13 AST 205 ALT 35 Alk phos 637 Tbili 1.6 CRP 21.9 on admission   Assessment:  [name] is a 76 y.o. female with past medical history of acute cholecystitis status post cholecystostomy, history of pancreatic cancer, hypothyroidism, DM, HLD, TIA, vit b12 deficiency, hypertension, generalized anxiety and depression disorder, history of venous thromboembolic disease, recent hospitalization for sepsis with Morganella morganii/E. coli bacteremia who was sent to the hospital as a direct admission for evaluation of poor oral intake and severe dehydration. his am patient was noted to be very drowsy, confused and unable to recognize family members as well as having bilateral upper extremity jerking movement. Sister in law at bedside states prior to CT scan patient was having continuous jerking  movements of both arms.  Myoclonus, although could multifactorial potentially could be related to cefepime neurotoxicity also in the setting of AKI, and medication overuse Encephalopathy  Hyponatremia   Recommendations: - Delirium precautions  - Agree with checking routine EEG (already ordered by primary team) - Ammonia level in process - Continue to correct metabolic abnormalities  - Would recommend changing cefepime to another agent due to potential cefepime toxicity  - hold off on giving sedating medications  -B1 level, replete following this - Neurology will continue to follow   Beulah Gandy DNP, ACNPC-AG  I have seen the patient reviewed the above note.  I suspect that there is a multifactorial component to her current encephalopathy given her multiple potential contributors including bacteremia, prolonged hospitalization, multiple deliriogenic medications including narcotics and benzodiazepines, AKI.  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  Narcotics are common cause, especially in setting of renal failure.  She also has been developing renal failure and is on a high dose of cefepime, which makes cefepime induced neurotoxicity more likely and this could certainly contribute to her current presentation.  She also has had poor intake for weeks, and thiamine deficiency can develop relatively quickly so I would favor aggressive repletion after checking level.  Neurology will follow  Roland Rack, MD [facility] [phone]  If 7pm- 7am, please page neurology on call as listed in [place].

## 2021-08-23 NOTE — Progress Notes (Signed)
PT Cancellation Note  Patient Details Name: [name] MRN: [id number] DOB: 03-30-1945   Cancelled Treatment:    Reason Eval/Treat Not Completed: Fatigue/lethargy limiting ability to participate. Pt very lethargic and not following many cues with MD. Family concerned about pt participating in therapy at this time due to her lethargy. MD confirmed PT can wait until tomorrow for eval.   Moishe Spice, PT, DPT Acute Rehabilitation Services  Office: [place] 08/23/2021, 12:06 PM

## 2021-08-23 NOTE — Progress Notes (Signed)
Palliative Medicine Inpatient Follow Up Note   HPI: 76 y.o. female  with past medical history of acute cholecystitis status post cholecystostomy, history of metastatic pancreatic cancer, hypothyroidism, hypertension, generalized anxiety disorder, history of venous thromboembolic disease, and recent hospitalization for sepsis with Morganella morganii/E. coli bacteremia admitted on 08/20/2021 with poor PO intake/dehydration. Also having ongoing nausea and vomiting. She has been feeling weak and tired; unable to get out of bed for 2 days; unable to participate in rehab at facility. Found to have hyponatremia and started on IV fluids. PMT consulted to discuss [place].   Today's Discussion 08/23/2021  *Please note that this is a verbal dictation therefore any spelling or grammatical errors are due to the "[employer]" system interpretation.  Chart reviewed inclusive of vital signs, progress notes, laboratory results, and diagnostic images.   Belenda is quite somnolent on exam and not able to participate in conversation this afternoon  I met with patient's daughter, [name] and we excused herself to the conference room to further discuss Shirleys clinical state. [name] expresses a decline in patient's neurological status compared to last night.  We reviewed that she received mirtazapine 7.5 mg which is likely what has led to her lethargic state.  Diagnostic work-up is being completed at this time.  Reviewed patient's cancer diagnosis and how it has been almost a full year since she received this. [name] shares that she is thought and continues to fight against her disease burden.  Discussed that it has been very hard for [name] to see her mother in the state she is in though her goal has always been to respect the wishes of Merly which have been to continue aggressive care.  Was able to review and tease out the differences between palliative and hospice care as below:  Palliative care is specialized  medical care for people living with a serious illness, such as cancer, heart failure, COPD, Alzheimer's dementia, etc. Patients in palliative care may receive medical care for their symptoms, or palliative care, along with treatment intended to cure their serious illness   Hospice care focuses on the care, comfort, and quality of life of a person with a serious illness who is approaching the end of life. At some point, it may not be possible to cure a serious illness, or a patient may choose not to undergo certain treatments. Hospice is designed for this situation.  Discussed that Cianna does have a progressive cancer which may not be controllable long-term via chemotherapy.  Reviewed that she is already experienced declines as noted by her significant failure to thrive.  Patient's daughter is most hopeful that an additional supplement can be added for appetite stimulation.  I shared that we could talk more about this tomorrow if Carrieann is more awake and oriented.  We did focus quite a bit on conversation in regards to where Khalil would likely like to spend the last phases of her life and this would be in her home.  We reviewed that additional insights would be appreciated from Dr. Benay Spice for further prognostication though it is very likely that surely his time course in life is now limited.  Regarding patient's cardiopulmonary resuscitation status her daughter is clear about the desire for a DO NOT RESUSCITATE.  She expresses that we will be cool to put her mother through anything additional.  Created space and opportunity for patient to explore thoughts feelings and fears regarding Ziyon's current medical situation.  Patient's daughter, Maylene Roes expresses she is having a  hard time coping with her mother's declining health state as this is the only living parents she has in addition to Lyons not having children it is truly the only kin that she presently has.  Offered time for her to reflect on her  feelings through empathetic listening.  Discussed that I will come by tomorrow morning to see if Angeliyah is more awake and able to participate in some of these more difficult conversations.  Questions and concerns addressed   Palliative Support Provided  Objective Assessment: Vital Signs Vitals:   08/23/21 0635 08/23/21 0810  BP: (!) 150/83 (!) 148/81  Pulse: 98 (!) 102  Resp: 18 18  Temp: 98.4 F (36.9 C) 98.3 F (36.8 C)  SpO2: 96% 97%    Intake/Output Summary (Last 24 hours) at 08/23/2021 1555 Last data filed at 08/23/2021 1100 Gross per 24 hour  Intake 90.96 ml  Output 430 ml  Net -339.04 ml   Last Weight  Most recent update: 08/20/2021 11:24 PM    Weight  72.8 kg (160 lb 7.9 oz)            Gen: Elderly Caucasian female HEENT: moist mucous membranes CV: Irregular rate and rhythm PULM: On room air breathing even/nonlabored ABD: soft/nontender EXT: Cachectic Neuro: Somnolent  SUMMARY OF RECOMMENDATIONS   DNAR/DNI  At this time we will plan to meet with [name] and [name] tomorrow morning to see if her mental state has improved  Appreciate Dr. Benay Spice helping with prognostication  Patient's daughter is interested in a supplement to help with appetite and-we did discuss Marinol which has less of a side effect profile than other agents  Open and honest conversations were had regarding the idea of hospice versus palliative support  Ongoing palliative care support  Time Spent: 95  Billing based on MDM: High  Problems Addressed: One acute or chronic illness or injury that poses a threat to life or bodily function  Amount and/or Complexity of Data: Category 3:Discussion of management or test interpretation with external physician/other qualified health care professional/appropriate source (not separately reported)  Risks: Decision not to resuscitate or to de-escalate care because of poor  prognosis ______________________________________________________________________________________ [place] Team Team Cell Phone: [phone] Please utilize secure chat with additional questions, if there is no response within 30 minutes please call the above phone number  Palliative Medicine Team providers are available by phone from 7am to 7pm daily and can be reached through the team cell phone.  Should this patient require assistance outside of these hours, please call the patient's attending physician.

## 2021-08-23 NOTE — Progress Notes (Signed)
[facility] for Infectious Disease  Date of Admission:  08/20/2021   Total days of inpatient antibiotics 2  Principal Problem:   Dehydration with hyponatremia Active Problems:   Essential hypertension   Acquired hypothyroidism   Diet-controlled diabetes mellitus ([place])   Primary pancreatic cancer with metastasis to other site ([place])   Weakness   Decubitus skin ulcer   Cholecystitis   VTE (venous thromboembolism)   AKI (acute kidney injury) ([place])   Protein-calorie malnutrition, severe ([name])          Assessment: 76 YF direct admitted due to N/V and decreased PO intake   # Dehydration # Weakness 2/2 decrease PO intake  # Hx of Subcapsular fluid collection adjacent to the liver(17.6 cm) with Cx NG # Hx of Acute cholecystitis SP cholecystostomy  with IR on 5/5 with Cx with Morganella morganii/B ovatus and E coli  # Hx of  Morganella morganii and E coli  bacteremia on 5/3  #Adenocarcinoma of pancrease, 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 SP CT guided drainage of perihepatic fluid 5/10. Cx no growth. She was discharged on vanc and cefepime with EOT 08/20/21 -CT on 6/2 showed near complete resolution of previous large perihepatic free fluid collection. Small volume residual inferomedial hepatic fluid collection. Plan was for capping trial and 2 week follow-up with IR> She presented ot ED on 6/1 with abdominal pain. CT showed rt perihepatic drainage catheter in place with near complete resolution of perihepatic fluid collection. Numerous liver mets.  Inferior perihepatic fluid collection again noted, measuring 2.3 cm,stable.  -Seen by ID on 6/5, and found to have PO intolerance. She was direct admitted for IVF(did not want to fo to ED). She noted abdominal pain is getting better. -6/6 she was asking to eat solid  food. She ended up vomiting after eating part of a  sandwich.  -Continue cefepime and metronidazole -Monitor for PO tolerance -When she is able to tolerate PO switch bactrim on discharge with ID follow-up on 6/14  Microbiology:   Antibiotics: Vancomycin 5/3 Cefepime 5/3, 5/6-p Aztreonam 5/3, Ceftriaxone 5/4-5/6 Metronidazole 5/3-p     Cultures:   Prior hospitalization: 5/3 blood Cx+ 2/2 Morgenella Morgani and 1/2 Ecoli 5/5 GB abscess Cx+ Ecoli, morganella morganii and bacteroides ovatus(beta lactamase positive) 5/10liver abscess perihepatic collection with NG  SUBJECTIVE: Resting in bed. Able to tolerate liquids.   Review of Systems: Review of Systems  All other systems reviewed and are negative.    Scheduled Meds:  amLODipine  5 mg Oral Daily   apixaban  5 mg Oral BID   bisacodyl  10 mg Rectal Once   bisacodyl  10 mg Rectal Daily   levothyroxine  125 mcg Oral QAC breakfast   metroNIDAZOLE  500 mg Oral BID   mirtazapine  7.5 mg Oral QHS   multivitamin with minerals  1 tablet Oral Daily   ondansetron (ZOFRAN) IV  4 mg Intravenous Q8H   pantoprazole  40 mg Oral Daily   polyethylene glycol  17 g Oral Daily   sodium chloride flush  5 mL Intracatheter Daily   sodium chloride flush  5 mL Intracatheter Daily   Continuous Infusions:  ceFEPime (MAXIPIME) IV 2 g (08/22/21 2222)   dextrose 5 % and 0.9% NaCl 75 mL/hr at 08/23/21 0325   PRN Meds:.ALPRAZolam, docusate sodium, iohexol,  ondansetron **OR** ondansetron (ZOFRAN) IV, oxyCODONE-acetaminophen, sodium chloride flush Allergies  Allergen Reactions   Penicillins Hives and Other (See Comments)    Tolerated Ceftriaxone 2023  Has patient had a PCN reaction causing immediate rash, facial/tongue/throat swelling, SOB or lightheadedness with hypotension: No Has patient had a PCN reaction causing severe rash involving mucus membranes or skin necrosis: No Has patient had a PCN reaction that required hospitalization No Has  patient had a PCN reaction occurring within the last 10 years: No If all of the above answers are "NO", then may proceed with Cephalosporin use.    OBJECTIVE: Vitals:   08/22/21 0417 08/22/21 0833 08/22/21 1638 08/22/21 2015  BP: 124/83 140/85 131/80 (!) 145/76  Pulse: 99 92 89 85  Resp: '16 15 20 18  '$ Temp: 97.6 F (36.4 C) 98.6 F (37 C) 98.1 F (36.7 C) 98.3 F (36.8 C)  TempSrc: Oral Axillary Oral Oral  SpO2: 98% 99% 100% 99%  Weight:      Height:       Body mass index is 28.43 kg/m.  Physical Exam Constitutional:      Appearance: Normal appearance.  HENT:     Head: Normocephalic and atraumatic.     Right Ear: Tympanic membrane normal.     Left Ear: Tympanic membrane normal.     Nose: Nose normal.     Mouth/Throat:     Mouth: Mucous membranes are moist.  Eyes:     Extraocular Movements: Extraocular movements intact.     Conjunctiva/sclera: Conjunctivae normal.     Pupils: Pupils are equal, round, and reactive to light.  Cardiovascular:     Rate and Rhythm: Normal rate and regular rhythm.     Heart sounds: No murmur heard.    No friction rub. No gallop.  Pulmonary:     Effort: Pulmonary effort is normal.     Breath sounds: Normal breath sounds.  Abdominal:     General: Abdomen is flat.     Palpations: Abdomen is soft.  Musculoskeletal:        General: Normal range of motion.  Skin:    General: Skin is warm and dry.  Neurological:     General: No focal deficit present.     Mental Status: She is alert and oriented to person, place, and time.  Psychiatric:        Mood and Affect: Mood normal.       Lab Results Lab Results  Component Value Date   WBC 7.3 08/22/2021   HGB 10.3 (L) 08/22/2021   HCT 31.6 (L) 08/22/2021   MCV 97.2 08/22/2021   PLT 146 (L) 08/22/2021    Lab Results  Component Value Date   CREATININE 1.32 (H) 08/22/2021   BUN 38 (H) 08/22/2021   NA 132 (L) 08/22/2021   K 3.9 08/22/2021   CL 106 08/22/2021   CO2 19 (L) 08/22/2021     Lab Results  Component Value Date   ALT 14 08/22/2021   AST 36 08/22/2021   ALKPHOS 340 (H) 08/22/2021   BILITOT 0.6 08/22/2021        Laurice Record, [facility] for Infectious Disease [facility] 08/23/2021, 5:27 AM

## 2021-08-24 ENCOUNTER — Inpatient Hospital Stay: Payer: Medicare HMO

## 2021-08-24 ENCOUNTER — Inpatient Hospital Stay: Payer: Medicare HMO | Admitting: Oncology

## 2021-08-24 DIAGNOSIS — N179 Acute kidney failure, unspecified: Secondary | ICD-10-CM | POA: Diagnosis not present

## 2021-08-24 DIAGNOSIS — B370 Candidal stomatitis: Secondary | ICD-10-CM | POA: Clinically undetermined

## 2021-08-24 DIAGNOSIS — E860 Dehydration: Secondary | ICD-10-CM | POA: Diagnosis not present

## 2021-08-24 DIAGNOSIS — E871 Hypo-osmolality and hyponatremia: Secondary | ICD-10-CM | POA: Diagnosis not present

## 2021-08-24 DIAGNOSIS — G9341 Metabolic encephalopathy: Secondary | ICD-10-CM | POA: Diagnosis not present

## 2021-08-24 DIAGNOSIS — E039 Hypothyroidism, unspecified: Secondary | ICD-10-CM | POA: Diagnosis not present

## 2021-08-24 DIAGNOSIS — K819 Cholecystitis, unspecified: Secondary | ICD-10-CM | POA: Diagnosis not present

## 2021-08-24 DIAGNOSIS — Z515 Encounter for palliative care: Secondary | ICD-10-CM

## 2021-08-24 LAB — RENAL FUNCTION PANEL
Albumin: 2.1 g/dL — ABNORMAL LOW (ref 3.5–5.0)
Anion gap: 5 (ref 5–15)
BUN: 40 mg/dL — ABNORMAL HIGH (ref 8–23)
CO2: 22 mmol/L (ref 22–32)
Calcium: 8.1 mg/dL — ABNORMAL LOW (ref 8.9–10.3)
Chloride: 107 mmol/L (ref 98–111)
Creatinine, Ser: 1.55 mg/dL — ABNORMAL HIGH (ref 0.44–1.00)
GFR, Estimated: 34 mL/min — ABNORMAL LOW (ref >60)
Glucose, Bld: 129 mg/dL — ABNORMAL HIGH (ref 70–99)
Phosphorus: 3.1 mg/dL (ref 2.5–4.6)
Potassium: 3.4 mmol/L — ABNORMAL LOW (ref 3.5–5.1)
Sodium: 134 mmol/L — ABNORMAL LOW (ref 135–145)

## 2021-08-24 LAB — CBC WITH DIFFERENTIAL/PLATELET
Abs Immature Granulocytes: 0.05 10*3/uL (ref 0.00–0.07)
Basophils Absolute: 0 10*3/uL (ref 0.0–0.1)
Basophils Relative: 0 %
Eosinophils Absolute: 0 10*3/uL (ref 0.0–0.5)
Eosinophils Relative: 0 %
HCT: 29.7 % — ABNORMAL LOW (ref 36.0–46.0)
Hemoglobin: 9.8 g/dL — ABNORMAL LOW (ref 12.0–15.0)
Immature Granulocytes: 1 %
Lymphocytes Relative: 12 %
Lymphs Abs: 0.9 10*3/uL (ref 0.7–4.0)
MCH: 31.8 pg (ref 26.0–34.0)
MCHC: 33 g/dL (ref 30.0–36.0)
MCV: 96.4 fL (ref 80.0–100.0)
Monocytes Absolute: 0.7 10*3/uL (ref 0.1–1.0)
Monocytes Relative: 8 %
Neutro Abs: 6.4 10*3/uL (ref 1.7–7.7)
Neutrophils Relative %: 79 %
Platelets: 105 10*3/uL — ABNORMAL LOW (ref 150–400)
RBC: 3.08 MIL/uL — ABNORMAL LOW (ref 3.87–5.11)
RDW: 18.6 % — ABNORMAL HIGH (ref 11.5–15.5)
WBC: 8 10*3/uL (ref 4.0–10.5)
nRBC: 0 % (ref 0.0–0.2)

## 2021-08-24 MED ORDER — POTASSIUM CHLORIDE 10 MEQ/100ML IV SOLN
10.0000 meq | Freq: Once | INTRAVENOUS | Status: AC
  Administered 2021-08-24: 10 meq via INTRAVENOUS
  Filled 2021-08-24: qty 100

## 2021-08-24 MED ORDER — DRONABINOL 2.5 MG PO CAPS
5.0000 mg | ORAL_CAPSULE | Freq: Two times a day (BID) | ORAL | Status: DC
  Administered 2021-08-25 – 2021-08-31 (×11): 5 mg via ORAL
  Filled 2021-08-24 (×11): qty 2

## 2021-08-24 MED ORDER — CIPROFLOXACIN IN D5W 400 MG/200ML IV SOLN
400.0000 mg | INTRAVENOUS | Status: DC
  Administered 2021-08-24 – 2021-08-27 (×4): 400 mg via INTRAVENOUS
  Filled 2021-08-24 (×4): qty 200

## 2021-08-24 MED ORDER — SODIUM CHLORIDE 0.9 % IV BOLUS
500.0000 mL | Freq: Once | INTRAVENOUS | Status: AC
Start: 2021-08-24 — End: 2021-08-24
  Administered 2021-08-24: 500 mL via INTRAVENOUS

## 2021-08-24 MED ORDER — NYSTATIN 100000 UNIT/ML MT SUSP
5.0000 mL | Freq: Four times a day (QID) | OROMUCOSAL | Status: DC
  Administered 2021-08-24 – 2021-08-30 (×20): 500000 [IU] via ORAL
  Filled 2021-08-24 (×23): qty 5

## 2021-08-24 MED ORDER — OXYCODONE HCL 5 MG PO TABS
2.5000 mg | ORAL_TABLET | Freq: Four times a day (QID) | ORAL | Status: DC | PRN
  Administered 2021-08-24 – 2021-08-31 (×13): 2.5 mg via ORAL
  Filled 2021-08-24 (×13): qty 1

## 2021-08-24 MED ORDER — SORBITOL 70 % SOLN
30.0000 mL | Status: AC
  Filled 2021-08-24: qty 30

## 2021-08-24 MED ORDER — POTASSIUM CHLORIDE 10 MEQ/100ML IV SOLN
10.0000 meq | INTRAVENOUS | Status: AC
  Administered 2021-08-24 (×2): 10 meq via INTRAVENOUS
  Filled 2021-08-24 (×2): qty 100

## 2021-08-24 MED ORDER — PANTOPRAZOLE SODIUM 40 MG IV SOLR
40.0000 mg | INTRAVENOUS | Status: DC
  Administered 2021-08-24 – 2021-08-27 (×4): 40 mg via INTRAVENOUS
  Filled 2021-08-24 (×4): qty 10

## 2021-08-24 MED ORDER — IBUPROFEN 400 MG PO TABS
400.0000 mg | ORAL_TABLET | Freq: Four times a day (QID) | ORAL | Status: DC | PRN
  Administered 2021-08-26: 400 mg via ORAL
  Filled 2021-08-24: qty 1

## 2021-08-24 NOTE — Progress Notes (Signed)
Subjective: Significant improvement since yesterday  Exam: Vitals:   08/24/21 0813 08/24/21 1624  BP: (!) 141/74 134/85  Pulse: 90 96  Resp: 17 17  Temp: 98 F (36.7 C) 98.3 F (36.8 C)  SpO2: 98% 98%   Gen: In bed, NAD Resp: non-labored breathing, no acute distress Abd: soft, nt  Neuro: MS: Awake, able to answer questions today, she is oriented to hospital, but not which one, not oriented to month or year CN: Visual fields are full, EOMI, face symmetric Motor: She has generalized weakness throughout Sensory: Intact to light touch  Pertinent results: EEG consistent with toxic/metabolic encephalopathy CT head-negative Ammonia 28 Creatinine 1.55 ESR 6  Impression: 76 year old female with what is likely multifactorial encephalopathy, but I did do think there is a significant possibility that cefepime was a primary contributing factor.  With her poor p.o. intake, I also started her on high-dose thiamine.  With her improvement, I think there is a decent chance she will continue to gradually improve, though her overall medical condition could contribute to ongoing delirium.  At this time, I think care would be supportive, though I would continue thiamine repletion as well.  Recommendations: 1) agree with changing from cefepime 2) continue thiamine repletion 3) continue delirium precautions such as keeping lights on during day, minimizing stimulation at night, etc. 4) neurology be available on an as-needed basis.  Roland Rack, MD [facility] [phone]  If 7pm- 7am, please page neurology on call as listed in [place].

## 2021-08-24 NOTE — Evaluation (Signed)
Clinical/Bedside Swallow Evaluation Patient Details  Name: [name] MRN: [id number] Date of Birth: 20-Sep-1945  Today's Date: 08/24/2021 Time: SLP Start Time (ACUTE ONLY): 39 SLP Stop Time (ACUTE ONLY): 1135 SLP Time Calculation (min) (ACUTE ONLY): 25 min  Past Medical History:  Past Medical History:  Diagnosis Date   Anxiety    Arthritis    Cancer ([place])    pancreatic   Depression    Diabetes mellitus without complication ([place])    Hyperlipemia    Hypertension    Thyroid disease    TIA (transient ischemic attack)    Vitamin B12 deficiency 10/20/2019   Past Surgical History:  Past Surgical History:  Procedure Laterality Date   ABDOMINAL HYSTERECTOMY     BILIARY STENT PLACEMENT N/A 09/28/2020   Procedure: BILIARY STENT PLACEMENT;  Surgeon: Clarene Essex, MD;  Location: [facility];  Service: Endoscopy;  Laterality: N/A;   BREAST BIOPSY     ERCP N/A 09/28/2020   Procedure: ENDOSCOPIC RETROGRADE CHOLANGIOPANCREATOGRAPHY (ERCP);  Surgeon: Clarene Essex, MD;  Location: [facility];  Service: Endoscopy;  Laterality: N/A;   IR IMAGING GUIDED PORT INSERTION  09/22/2020   IR PERC CHOLECYSTOSTOMY  07/20/2021   IR RADIOLOGIST EVAL & MGMT  08/15/2021   IR THORACENTESIS ASP PLEURAL SPACE W/IMG GUIDE  07/24/2021   SPHINCTEROTOMY  09/28/2020   Procedure: SPHINCTEROTOMY;  Surgeon: Clarene Essex, MD;  Location: [facility];  Service: Endoscopy;;   TUBAL LIGATION     HPI:  Pt is a 76 y.o. female who presented 08/20/21 for dehydration with hyponatremia, cholecystitis, AKI, and weakness with poor oral intake. Swallow eval was ordered 6/8 when pt was noted to be orally holding pills and too weak to take a sip of water per RN note. CT head 6/8 negative. EEG 6/8: generalized periodic discharges with triphasic morphology on the ictal-interictal continuum, commonly indicative of toxic-metabolic causes like hyperammonemia, cefepime toxicity, moderate to severe diffuse encephalopathy also noted. PMT discussions  ongoing regarding [place]. PMH: COVID-19, T2DM, HLD, HTN, hx of TIA, depression/anxiety, metastatic pancreatic cancer currently undergoing chemotherapy, s/p cholecystostomy by IR 5/5    Assessment / Plan / Recommendation  Clinical Impression  Pt was seen for bedside swallow evaluation with her daughter present for part of the evaluation. Pt reported that she has been able to swallow solids and liquids without difficulty with mastication, globus sensation, or with signs of aspiration. However, she stated that foods have not tasted good and that she has therefore been disinterested in most foods. Oral mechanism exam was [facility] and she presented with adequate, natural dentition. Pt was positioned suboptimally at 32 degrees due to complaint of significant pain when she was more upright. She demonstrated oral holding, and mildly prolonged mastication, but her oropharyngeal swallow was otherwise WFL. No s/sx of aspiration were demonstrated despite her positioning. Considering pt's report of numerous foods being unappealing, a regular texture diet with thin liquids will be initiated to maximize pt's options. SLP will follow pt to ensure tolerance. SLP Visit Diagnosis: Dysphagia, unspecified (R13.10)    Aspiration Risk  Mild aspiration risk    Diet Recommendation Regular;Thin liquid   Liquid Administration via: Straw;Cup Medication Administration: Whole meds with puree Supervision: Staff to assist with self feeding Compensations: Slow rate;Small sips/bites Postural Changes:  (seat as upright as pt can tolerate)    Other  Recommendations Oral Care Recommendations: Oral care BID    Recommendations for follow up therapy are one component of a multi-disciplinary discharge planning process, led by the attending physician.  Recommendations may be updated based on patient status, additional functional criteria and insurance authorization.  Follow up Recommendations No SLP follow up      Assistance Recommended at  Discharge    Functional Status Assessment    Frequency and Duration min 2x/week  1 week       Prognosis Prognosis for Safe Diet Advancement: Good      Swallow Study   General Date of Onset: 08/23/21 HPI: Pt is a 76 y.o. female who presented 08/20/21 for dehydration with hyponatremia, cholecystitis, AKI, and weakness with poor oral intake. Swallow eval was ordered 6/8 when pt was noted to be orally holding pills and too weak to take a sip of water per RN note. CT head 6/8 negative. EEG 6/8: generalized periodic discharges with triphasic morphology on the ictal-interictal continuum, commonly indicative of toxic-metabolic causes like hyperammonemia, cefepime toxicity, moderate to severe diffuse encephalopathy also noted. PMT discussions ongoing regarding [place]. PMH: COVID-19, T2DM, HLD, HTN, hx of TIA, depression/anxiety, metastatic pancreatic cancer currently undergoing chemotherapy, s/p cholecystostomy by IR 5/5 Type of Study: Bedside Swallow Evaluation Previous Swallow Assessment: none Diet Prior to this Study: Dysphagia 3 (soft);Thin liquids Temperature Spikes Noted: No Respiratory Status: Room air History of Recent Intubation: No Behavior/Cognition: Alert;Cooperative;Pleasant mood Oral Cavity Assessment: Within Functional Limits Oral Care Completed by SLP: No Oral Cavity - Dentition: Adequate natural dentition Vision: Functional for self-feeding Self-Feeding Abilities: Total assist Patient Positioning: Partially reclined Baseline Vocal Quality: Normal Volitional Swallow: Able to elicit    Oral/Motor/Sensory Function Overall Oral Motor/Sensory Function: Within functional limits   Ice Chips Ice chips: Within functional limits Presentation: Spoon   Thin Liquid Thin Liquid: Impaired Presentation: Straw Oral Phase Functional Implications: Oral holding    Nectar Thick Nectar Thick Liquid: Not tested   Honey Thick Honey Thick Liquid: Not tested   Puree Puree: Impaired Presentation:  Spoon Oral Phase Functional Implications: Oral holding   Solid     Solid: Impaired Oral Phase Impairments:  (prolonged mastication)     Avalon Coppinger I. Hardin Negus, [place], [place] Office number [phone] Pager [place] 08/24/2021,1:07 PM

## 2021-08-24 NOTE — Consult Note (Signed)
   [facility] CM Inpatient Consult   08/24/2021  [name] 1946/02/20 [id number]  [facility] [ACO] Patient: [name] Medicare  Primary Care Provider:  [name], MD is with [place] at  [facility] is an embedded provider with a Chronic Care Management team and program, and is listed for the transition of care follow up and appointments.  Patient was screened for readmission Embedded practice service needs for chronic care management.  Progress notes reviewed for ongoing consults for post hospital needs. 1415:  Patient was resting on rounds, spoke quietly with family member regarding reason for visit.  He states she doesn't want care management,  Explained reason for visit and PCP confirmed and given a follow up appointment reminder card with a 24 hour nurse advise line.   Plan: Continue to follow for progress and any post hospital care needs can be sent to the [place] Management  team, if appropriate.  Please contact for further questions,  Natividad Brood, RN BSN [facility] Liaison  [phone] business mobile phone Toll free office [phone]  Fax number: [phone] [email or web address]

## 2021-08-24 NOTE — Progress Notes (Signed)
Supervising Physician: Daryll Brod  Patient Status:  [facility] - In-pt  Chief Complaint:  Pandreatic cancer Cholecystitis--with perc chole drain and perihepatic drain  Subjective:  Fatigued today, in much pain, more alert today  Allergies: Penicillins  Medications: Prior to Admission medications   Medication Sig Start Date End Date Taking? Authorizing Provider  ALPRAZolam (XANAX) 0.25 MG tablet Take 0.25 mg by mouth 2 (two) times daily as needed for anxiety. 08/02/21  Yes [provider]  amLODipine (NORVASC) 5 MG tablet Take 5 mg by mouth daily.   Yes [provider]  apixaban (ELIQUIS) 5 MG TABS tablet Take 5 mg by mouth 2 (two) times daily.   Yes [provider]  ceFEPIme (MAXIPIME) 2 g injection Inject 2 g into the muscle 2 (two) times daily. 08/20/21  Yes [provider]  cyanocobalamin (,VITAMIN B-12,) 1000 MCG/ML injection Inject 1 mL (1,000 mcg total) into the muscle every 30 (thirty) days. 01/11/21  Yes [name], MD  docusate sodium (COLACE) 100 MG capsule Take 100 mg by mouth 2 (two) times daily as needed (constipation).   Yes [provider]  famotidine (PEPCID) 20 MG tablet Take 1 tablet (20 mg total) by mouth daily. 08/02/21  Yes Hosie Poisson, MD  levothyroxine (SYNTHROID) 125 MCG tablet TAKE 1 TABLET ON AN EMPTY STOMACH IN THE MORNING Patient taking differently: Take 125 mcg by mouth daily before breakfast. 06/04/21  Yes [name], MD  lidocaine-prilocaine (EMLA) cream APPLY 1 APPLICATION TOPICALLY AS NEEDED. Patient taking differently: Apply 1 application. topically daily as needed (numbing). 07/04/21  Yes Ladell Pier, MD  omeprazole (PRILOSEC) 20 MG capsule TAKE 1 CAPSULE BY MOUTH EVERY DAY Patient taking differently: Take 20 mg by mouth at bedtime. 04/18/21  Yes [name], MD  oxyCODONE (OXY IR/ROXICODONE) 5 MG immediate release tablet Take 5 mg by mouth 3 (three) times daily as needed for moderate pain  or severe pain. 08/17/21  Yes [provider]  oxyCODONE-acetaminophen (PERCOCET/ROXICET) 5-325 MG tablet Take 1-2 tablets by mouth every 6 (six) hours as needed for severe pain. 08/17/21  Yes Horton, Barbette Hair, MD  polyethylene glycol powder (GLYCOLAX/MIRALAX) 17 GM/SCOOP powder Take 17 g by mouth daily. Patient taking differently: Take 17 g by mouth daily as needed for moderate constipation. 08/02/21  Yes Hosie Poisson, MD  prochlorperazine (COMPAZINE) 10 MG tablet Take 1 tablet (10 mg total) by mouth every 6 (six) hours as needed (Nausea or vomiting). 09/12/20  Yes Truitt Merle, MD  ACCU-CHEK AVIVA PLUS test strip As needed 05/09/21   [name], MD  Accu-Chek Softclix Lancets lancets As needed 04/23/21   [name], MD  Alcohol Swabs PADS USE AS DIRECTED 05/18/21   [name], MD  apixaban (ELIQUIS) 5 MG TABS tablet Take 2 tablets (10 mg total) by mouth 2 (two) times daily for 1 day. 08/01/21 08/20/21  Hosie Poisson, MD  apixaban (ELIQUIS) 5 MG TABS tablet Take 2 tablets (10 mg total) by mouth 2 (two) times daily for 1 day. then Take 1 tablet (5 mg total) by mouth 2 (two) times daily. 08/02/21   Hosie Poisson, MD  Blood Glucose Monitoring Suppl (ACCU-CHEK GUIDE) w/Device KIT As needed 05/11/21   [name], MD  calcium carbonate (TUMS EX) 750 MG chewable tablet Chew 1 tablet by mouth daily.    [provider]  LORazepam (ATIVAN) 0.5 MG tablet Take 0.5 mg by mouth as needed for anxiety.  [provider]  metroNIDAZOLE (FLAGYL) 500 MG tablet Take 500 mg by mouth 2 (two) times daily. Last dose will be 08/20/21    [provider]  Multiple Vitamin (MULTIVITAMIN WITH MINERALS) TABS tablet Take 1 tablet by mouth daily. 08/02/21   Hosie Poisson, MD  sodium chloride 0.9 % infusion Inject into the vein. 08/13/21   [provider]  traMADol (ULTRAM) 50 MG tablet Take 1 tablet (50 mg total) by mouth every 6 (six) hours as needed for moderate pain or severe  pain. Patient not taking: Reported on 08/09/2021 08/01/21   Hosie Poisson, MD     Vital Signs: BP (!) 141/74 (BP Location: Right Arm)   Pulse 90   Temp 98 F (36.7 C) (Oral)   Resp 17   Ht _0  (1.6 m)   Wt 160 lb 7.9 oz (72.8 kg)   SpO2 98%   BMI 28.43 kg/m   Physical Exam Vitals reviewed.  Constitutional:      Appearance: She is ill-appearing.  HENT:     Head: Normocephalic and atraumatic.     Mouth/Throat:     Pharynx: Oropharynx is clear.  Eyes:     Extraocular Movements: Extraocular movements intact.  Cardiovascular:     Rate and Rhythm: Normal rate.  Pulmonary:     Effort: Pulmonary effort is normal.  Abdominal:     General: Abdomen is flat.     Palpations: Abdomen is soft.  Skin:    General: Skin is dry.     Coloration: Skin is pale.  Neurological:     Mental Status: She is alert.    Drain Location: epigastric, RUQ Size: 12 Fr, 10Fr Date of placement: 5/10, 5/5 Currently to: Drain collection device: gravity 24 hour output:  Output by Drain (mL) 08/22/21 0700 - 08/22/21 1459 08/22/21 1500 - 08/22/21 2259 08/22/21 2300 - 08/23/21 0659 08/23/21 0700 - 08/23/21 1459 08/23/21 1500 - 08/23/21 2259 08/23/21 2300 - 08/24/21 0659 08/24/21 0700 - 08/24/21 1459 08/24/21 1500 - 08/24/21 1539  Closed System Drain RUQ 10.2 Fr. 50  _1 Closed System Drain 1 Right RUQ Other (Comment) 12 Fr. 0  0   0      Current examination: Perihepatic drain still with 0 OP Chole drain: Flushes easily.  Still with same dark reddish-brown fluid in bag Insertion site unremarkable. Suture and stat lock in place. Dressed appropriately.      Imaging: EEG adult  Result Date: 08/23/2021 Lora Havens, MD     08/23/2021  8:07 PM Patient Name: NAYLANI BRADNER MRN: [id number] Epilepsy Attending: Lora Havens Referring Physician/Provider: Eugenie Filler, MD Date: 08/23/2021 Duration: 28.24 mins Patient history: 76yo F with ams and continuous jerking movements of both arms.  EEG to evaluate for seizure. Level of alertness: lethargic AEDs during EEG study: None Technical aspects: This EEG study was done with scalp electrodes positioned according to the 10-20 International system of electrode placement. Electrical activity was acquired at a sampling rate of _2  and reviewed with a high frequency filter of _3  and a low frequency filter of _4 . EEG data were recorded continuously and digitally stored. Description: No clear posterior dominant rhythm was seen. EEG showed continuous generalized polymorphic 3-_5  theta-delta slowing. Generalized periodic discharges with triphasic morphology at 1-1._6  were also noted, more prominent when awake/stimulated. Hyperventilation and photic stimulation were not performed.    ABNORMALITY - Periodic discharges with triphasic morphology, generalized ( GPDs) - Continuous slow, generalized  IMPRESSION: This study showed generalized periodic discharges with triphasic morphology. This EEG pattern is on the ictal-interictal continuum. However the morphology, frequency and reactivity to stimulation is more commonly indicative of toxic-metabolic causes like hyperammonemia, cefepime toxicity. Additionally there is moderate to severe diffuse encephalopathy. No seizures were seen throughout the recording.  If patient remains altered, can consider prolonged EEG monitoring.  Priyanka Barbra Sarks    CT HEAD WO CONTRAST (5MM)  Result Date: 08/23/2021 CLINICAL DATA:  Altered mental status EXAM: CT HEAD WITHOUT CONTRAST TECHNIQUE: Contiguous axial images were obtained from the base of the skull through the vertex without intravenous contrast. RADIATION DOSE REDUCTION: This exam was performed according to the departmental dose-optimization program which includes automated exposure control, adjustment of the mA and/or kV according to patient size and/or use of iterative reconstruction technique. COMPARISON:  06/01/2016 FINDINGS: Brain: No acute intracranial findings are  seen in noncontrast CT brain. There are no signs of bleeding. Cortical sulci are prominent. There is decreased density in the periventricular white matter. Vascular: Unremarkable. Skull: Unremarkable. Sinuses/Orbits: Unremarkable. Other: No significant interval changes are noted. IMPRESSION: No acute intracranial findings are seen in noncontrast CT brain. Atrophy. Small-vessel disease. Electronically Signed   By: Elmer Picker M.D.   On: 08/23/2021 13:11   CT ABDOMEN PELVIS W CONTRAST  Result Date: 08/21/2021 CLINICAL DATA:  Cholecystitis status post cholecystostomy tube placement. History of pancreatic cancer. EXAM: CT ABDOMEN AND PELVIS WITH CONTRAST TECHNIQUE: Multidetector CT imaging of the abdomen and pelvis was performed using the standard protocol following bolus administration of intravenous contrast. RADIATION DOSE REDUCTION: This exam was performed according to the departmental dose-optimization program which includes automated exposure control, adjustment of the mA and/or kV according to patient size and/or use of iterative reconstruction technique. CONTRAST:  198m OMNIPAQUE IOHEXOL 300 MG/ML  SOLN COMPARISON:  CT abdomen pelvis dated August 16, 2021. FINDINGS: Lower chest: Unchanged moderate right and small left pleural effusions with adjacent atelectasis in both posterior lower lobes. Hepatobiliary: Multiple hypodense liver lesions are unchanged. Right perihepatic drainage catheter remains in place without significant residual perihepatic fluid collection. 2.2 cm posteroinferior perihepatic fluid collection is unchanged (series 3, image 20). New small amount of pneumobilia in the left liver. Multiple gallstones again noted with slightly increased distention of the gallbladder status post cholecystostomy tube placement. Unchanged common bile duct stent. Pancreas: Unchanged hypodense mass in the pancreatic neck with atrophy of the body and tail. Spleen: Unchanged wedge-shaped hypodensity in the  inferior spleen, likely a small infarct. Normal in size. Adrenals/Urinary Tract: Adrenal glands are unremarkable. Unchanged small right renal simple cysts. No follow-up imaging is recommended. No renal calculi or hydronephrosis. The bladder is distended with excreted contrast. Stomach/Bowel: Unchanged small hiatal hernia. The stomach is otherwise within normal limits. No bowel wall thickening, distention, or surrounding inflammatory changes. Scattered mild colonic diverticulosis. Similar moderate stool burden throughout the colon. Normal appendix. 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 Aortoiliac atherosclerotic vascular disease. No enlarged abdominal or pelvic lymph nodes. Reproductive: Status post hysterectomy. No adnexal masses. Other: Decreased small volume ascites in the pelvis. No pneumoperitoneum. Musculoskeletal: No acute or significant osseous findings. Unchanged anasarca. IMPRESSION: 1. Status post cholecystostomy tube placement with slightly increased distention of the gallbladder compared to prior study. Correlate with tube function. 2. Right perihepatic drainage catheter remains in place without significant residual perihepatic fluid collection around the drain. Unchanged 2.2 cm posteroinferior perihepatic fluid  collection separate from the drain. 3. 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 4. Decreased small volume ascites in the pelvis. 5. Unchanged moderate right and small left pleural effusions. 6. 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 7. Aortic Atherosclerosis (ICD10-I70.0). Electronically Signed   By: Titus Dubin M.D.   On: 08/21/2021 17:58   US RENAL  Result Date: 08/21/2021 CLINICAL DATA:  Acute kidney injury EXAM: RENAL /  URINARY TRACT ULTRASOUND COMPLETE COMPARISON:  CT abdomen 08/16/2021 FINDINGS: Right Kidney: Renal measurements: 9.5 by 4.0 by 5.0 cm = volume: 102 mL. Mildly hyperechoic relative to the liver. No solid mass or hydronephrosis visualized. A right kidney upper pole cyst measures 2.0 by 3.0 by 2 2.6 cm and appears simple. Left Kidney: Renal measurements: 9.7 by 4.0 by 4.8 cm = volume: 99 mL. Mildly hyperechoic relative to the spleen. No mass or hydronephrosis visualized. Bladder: Appears normal for degree of bladder distention. A left ureteral jet is visualized. Ascites in the lower abdomen. IMPRESSION: 1. Bilateral renal parenchymal hyperechogenicity suggesting chronic medical renal disease. 2. Right kidney upper pole simple appearing cyst. 3. A left ureteral jet is visualized in the urinary bladder. Right ureteral jet not seen, although this may simply be incidental. 4. Pelvic ascites. Electronically Signed   By: Van Clines M.D.   On: 08/21/2021 16:00   DG CHEST PORT 1 VIEW  Result Date: 08/20/2021 CLINICAL DATA:  Status post PICC line placement. EXAM: PORTABLE CHEST 1 VIEW COMPARISON:  07/27/2021. FINDINGS: The heart size and mediastinal contours are within normal limits. There is atherosclerotic calcification of the aorta. Patchy airspace disease is noted at the right lung base. There are questionable small bilateral pleural effusions. No pneumothorax. A right chest port is stable. No PICC line is identified. A stable pigtail catheter is noted in the right upper quadrant. No acute osseous abnormality. IMPRESSION: 1. Small bilateral pleural effusions with atelectasis at the lung bases. 2. No PICC line is identified.  Clinical correlation is recommended. Electronically Signed   By: Brett Fairy M.D.   On: 08/20/2021 23:45    Labs:  CBC: Recent Labs    08/21/21 0317 08/22/21 0425 08/23/21 0456 08/24/21 0342  WBC 8.4 7.3 4.9 8.0  HGB 10.7* 10.3* 9.2* 9.8*  HCT 32.6* 31.6* 28.5* 29.7*  PLT 160  146* 103* 105*    COAGS: Recent Labs    10/14/20 1457 06/30/21 0939 07/18/21 1503 07/19/21 0445 07/24/21 1306  INR 1.1 1.1 1.2 1.3* 1.2  APTT 45*  --  28  --   --     BMP: Recent Labs    08/21/21 0317 08/22/21 0425 08/23/21 0456 08/24/21 0342  NA 134* 132* 134* 134*  K 3.9 3.9 3.5 3.4*  CL 108 106 111 107  CO2 19* 19* 18* 22  GLUCOSE 116* 91 93 129*  BUN 35* 38* 39* 40*  CALCIUM 8.1* 8.2* 8.6* 8.1*  CREATININE 1.13* 1.32* 1.48* 1.55*  GFRNONAA 50* 41* 36* 34*    LIVER FUNCTION TESTS: Recent Labs    08/16/21 2228 08/21/21 0317 08/22/21 0425 08/23/21 0456 08/24/21 0342  BILITOT 0.5 0.6 0.6 1.6*  --   AST 31 34 36 205*  --   ALT _0 35  --   ALKPHOS 267* 340* 340* 637*  --   PROT 5.7* 5.3* 5.2* 5.3*  --   ALBUMIN 1.7* <1.5* <1.5* 2.6* 2.1*  Assessment and Plan:  SP CT guided drainage of perihepatic fluid 5/10  --No OP x several days, CT shows resolution,  --drain removed at bedside today, not well tolerated, painful experience Acute cholecystitis SP cholecystostomy with IR on 5/5 -Continue TID flushes with 5 cc NS. -Record output Q shift. -Dressing changes QD or PRN if soiled.  -Call IR APP or on call IR MD if difficulty flushing or sudden change in drain output.  -Repeat imaging/possible drain injection once output < 10 mL/QD (excluding flush material.)    Discharge planning: Please contact IR APP or on call IR MD prior to patient d/c to ensure appropriate follow up plans are in place. Typically patient will follow up with IR clinic 10-14 days post d/c for repeat imaging/possible drain injection. IR scheduler will contact patient with date/time of appointment. Patient will need to flush drain QD with 5 cc NS, record output QD, dressing changes every 2-3 days or earlier if soiled.   IR will continue to follow - please call with questions or concerns.      Electronically Signed: Pasty Spillers, PA 08/24/2021, 3:34 PM   I spent a total of  25 Minutes at the the patient's bedside AND on the patient's hospital floor or unit, greater than 50% of which was counseling/coordinating care for drain management

## 2021-08-24 NOTE — Progress Notes (Signed)
PT Cancellation Note  Patient Details Name: [name] MRN: [id number] DOB: Jul 18, 1945   Cancelled Treatment:    Reason Eval/Treat Not Completed: Patient declined, no reason specified Pt and pt's sister in law reporting pt is very fatigued and has had many care providers come in this afternoon. Requesting to hold on PT today. Will follow up as schedule allows.   Lou Miner, DPT  Acute Rehabilitation Services  Office: [phone]    Rudean Hitt 08/24/2021, 1:26 PM

## 2021-08-24 NOTE — Progress Notes (Signed)
Patient became more awake, alert and expressive as this shift progressed. Patient was able to tell me that she was not having much pain and wanted to switch the towel behind her neck with a pillow. The patient told me that she had some chest pain that felt like indigestion. She also stated that she did not wish to take any medication tonight PO or IV. Patient was very lucid and said she wanted to clear her system of the previous nights medication. Daughter remained at bedside all shift. Daughter voiced concerns regarding the medication given to her mother from the night before. I told the daughter that I would let management know about these concerns in the morning. Patient resting comfortably. Will continue to monitor.

## 2021-08-24 NOTE — Progress Notes (Signed)
[facility] for Infectious Disease  Date of Admission:  08/20/2021   Total days of inpatient antibiotics 3  Principal Problem:   Dehydration with hyponatremia Active Problems:   Essential hypertension   Acquired hypothyroidism   Diet-controlled diabetes mellitus ([place])   Primary pancreatic cancer with metastasis to other site ([place])   Weakness   Decubitus skin ulcer   Acute metabolic encephalopathy   Cholecystitis   VTE (venous thromboembolism)   AKI (acute kidney injury) ([place])   Protein-calorie malnutrition, severe ([name])   Palliative care by specialist   Oral thrush          Assessment: 76 YF direct admitted due to N/V and decreased PO intake   # Dehydration # Weakness 2/2 decrease PO intake  # Hx of Subcapsular fluid collection adjacent to the liver(17.6 cm) with Cx NG # Hx of Acute cholecystitis SP cholecystostomy  with IR on 5/5 with Cx with Morganella morganii/B ovatus and E coli  # Hx of  Morganella morganii and E coli  bacteremia on 5/3  #Adenocarcinoma of pancrease, 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 SP CT guided drainage of perihepatic fluid 5/10. Cx no growth. She was discharged on vanc and cefepime with EOT 08/20/21 -CT on 6/2 showed near complete resolution of previous large perihepatic free fluid collection. Small volume residual inferomedial hepatic fluid collection. Plan was for capping trial and 2 week follow-up with IR> She presented ot ED on 6/1 with abdominal pain. CT showed rt perihepatic drainage catheter in place with near complete resolution of perihepatic fluid collection. Numerous liver mets.  Inferior perihepatic fluid collection again noted, measuring 2.3 cm,stable.  -Seen by ID on 6/5, and found to have PO intolerance. She was direct admitted for IVF(did not want to fo to  ED). She noted abdominal pain is getting better. -Pt had worsening mental status on 6/8, noted to received remaron/xanax. Neurology consulted and concerned with cefepime toxicity. Cefepime switched to ciprofloxacin. Pallative care on board, pending weekend progressio, may place hospice referral -IR consulted and perihepatic drain remained.  -Oncology following   Recommendations:  -D/C cefepime -Start cipro and continue metronidazole -Monitor for PO tolerance, management per primary. -If pt is able to tolerates PO switch to PO regimen. ID appt with Dr. Juleen China on 6/14 in place.   Microbiology:   Antibiotics: Vancomycin 5/3 Cefepime 5/3, 5/6-p Aztreonam 5/3, Ceftriaxone 5/4-5/6 Metronidazole 5/3-p     Cultures:   Prior hospitalization: 5/3 blood Cx+ 2/2 Morgenella Morgani and 1/2 Ecoli 5/5 GB abscess Cx+ Ecoli, morganella morganii and bacteroides ovatus(beta lactamase positive) 5/10liver abscess perihepatic collection with NG  SUBJECTIVE: Resting in bed. Less alert. Right hand is shaking  Review of Systems: Review of Systems  All other systems reviewed and are negative.    Scheduled Meds:  amLODipine  5 mg Oral Daily   apixaban  5 mg Oral BID   bisacodyl  10 mg Rectal Once   bisacodyl  10 mg Rectal Daily   dronabinol  5 mg Oral BID AC   levothyroxine  125 mcg Oral QAC breakfast   multivitamin with minerals  1 tablet Oral Daily   nystatin  5 mL Oral QID   pantoprazole (PROTONIX) IV  40 mg Intravenous Q24H   polyethylene glycol  17 g Oral Daily   sodium chloride flush  5 mL Intracatheter Daily   sodium chloride flush  5 mL Intracatheter Daily   sorbitol  30 mL Oral Q3H   [START ON 09/01/2021] thiamine injection  100 mg Intravenous Daily   Continuous Infusions:  ciprofloxacin 400 mg (08/24/21 1247)   metronidazole 500 mg (08/24/21 1554)   sodium bicarbonate 150 mEq in dextrose 5 % 1,150 mL infusion 100 mL/hr at 08/24/21 1333   thiamine injection 500 mg (08/24/21  2130)   Followed by   Derrill Memo ON 08/26/2021] thiamine injection     PRN Meds:.ALPRAZolam, docusate sodium, ibuprofen, iohexol, ondansetron **OR** ondansetron (ZOFRAN) IV, oxyCODONE, sodium chloride flush Allergies  Allergen Reactions   Penicillins Hives and Other (See Comments)    Tolerated Ceftriaxone 2023  Has patient had a PCN reaction causing immediate rash, facial/tongue/throat swelling, SOB or lightheadedness with hypotension: No Has patient had a PCN reaction causing severe rash involving mucus membranes or skin necrosis: No Has patient had a PCN reaction that required hospitalization No Has patient had a PCN reaction occurring within the last 10 years: No If all of the above answers are "NO", then may proceed with Cephalosporin use.    OBJECTIVE: Vitals:   08/24/21 0542 08/24/21 0813 08/24/21 1624 08/24/21 2130  BP: (!) 121/46 (!) 141/74 134/85 138/82  Pulse: 93 90 96 (!) 101  Resp: '18 17 17 16  '$ Temp: 98.1 F (36.7 C) 98 F (36.7 C) 98.3 F (36.8 C) 97.8 F (36.6 C)  TempSrc: Oral Oral Oral Oral  SpO2: 97% 98% 98% 98%  Weight:      Height:       Body mass index is 28.43 kg/m.  Physical Exam Constitutional:      Appearance: Normal appearance.  HENT:     Head: Normocephalic and atraumatic.     Right Ear: Tympanic membrane normal.     Left Ear: Tympanic membrane normal.     Nose: Nose normal.     Mouth/Throat:     Mouth: Mucous membranes are moist.  Eyes:     Extraocular Movements: Extraocular movements intact.     Conjunctiva/sclera: Conjunctivae normal.     Pupils: Pupils are equal, round, and reactive to light.  Cardiovascular:     Rate and Rhythm: Normal rate and regular rhythm.     Heart sounds: No murmur heard.    No friction rub. No gallop.  Pulmonary:     Effort: Pulmonary effort is normal.     Breath sounds: Normal breath sounds.  Abdominal:     General: Abdomen is flat.     Palpations: Abdomen is soft.     Comments: ABD drains in place   Musculoskeletal:        General: Normal range of motion.  Skin:    General: Skin is warm and dry.  Neurological:     General: No focal deficit present.     Mental Status: She is oriented to person, place, and time.  Psychiatric:        Mood and Affect: Mood normal.       Lab Results Lab Results  Component Value Date   WBC 8.0 08/24/2021   HGB 9.8 (L) 08/24/2021   HCT 29.7 (L) 08/24/2021   MCV 96.4 08/24/2021   PLT 105 (L) 08/24/2021    Lab Results  Component Value Date   CREATININE 1.55 (H) 08/24/2021   BUN 40 (H) 08/24/2021   NA 134 (L) 08/24/2021   K 3.4 (L) 08/24/2021   CL 107 08/24/2021  CO2 22 08/24/2021    Lab Results  Component Value Date   ALT 35 08/23/2021   AST 205 (H) 08/23/2021   ALKPHOS 637 (H) 08/23/2021   BILITOT 1.6 (H) 08/23/2021        Laurice Record, [facility] 08/24/2021, 9:50 PM

## 2021-08-24 NOTE — Progress Notes (Signed)
   Palliative Medicine Inpatient Follow Up Note  HPI: 76 y.o. female  with past medical history of acute cholecystitis status post cholecystostomy, history of metastatic pancreatic cancer, hypothyroidism, hypertension, generalized anxiety disorder, history of venous thromboembolic disease, and recent hospitalization for sepsis with Morganella morganii/E. coli bacteremia admitted on 08/20/2021 with poor PO intake/dehydration. Also having ongoing nausea and vomiting. She has been feeling weak and tired; unable to get out of bed for 2 days; unable to participate in rehab at facility. Found to have hyponatremia and started on IV fluids. PMT consulted to discuss [place].   Today's Discussion 08/24/2021  *Please note that this is a verbal dictation therefore any spelling or grammatical errors are due to the "[place] [employer]" system interpretation.  Chart reviewed inclusive of vital signs, progress notes, laboratory results, and diagnostic images.   I stopped by patients room this afternoon. Patient was awake and alert. She expressed not wishing to speak to the Palliative care team at this time. I shared that this is okay and it is her decision if she would like to speak with [place] or not. Reviewed that we would be available if needed moving forward.  I called patients daughter, [name] this afternoon. I was unable to get in touch with her though she did call back and leave a voicemail that presently the patient does not wish for [place] to engage. [name] shared she would reach out to the service if their thoughts change.  _____________________ Addendum:  I reached out to the oncology team sharing the patient is not interesting in our involvement. Plan to see how patient does over the weekend and if no improvement a hospice referral will be made.  Objective Assessment: Vital Signs Vitals:   08/24/21 0542 08/24/21 0813  BP: (!) 121/46 (!) 141/74  Pulse: 93 90  Resp: 18 17  Temp: 98.1 F (36.7 C) 98 F (36.7 C)   SpO2: 97% 98%    Intake/Output Summary (Last 24 hours) at 08/24/2021 1559 Last data filed at 08/24/2021 1400 Gross per 24 hour  Intake 2748.92 ml  Output 230 ml  Net 2518.92 ml    Last Weight  Most recent update: 08/20/2021 11:24 PM    Weight  72.8 kg (160 lb 7.9 oz)            Gen: Elderly Caucasian female HEENT: moist mucous membranes CV: Irregular rate and rhythm PULM: On room air breathing even/nonlabored ABD: soft/nontender EXT: Cachectic Neuro: Awake able to converse  SUMMARY OF RECOMMENDATIONS   DNAR/DNI  Patient mental state has improved to the point where she shares she does not wish for our involvement  We will continue to shadow the chart though we will not re-engage unless requested by patient or her family  Billing based on MDM: High ______________________________________________________________________________________ [place] Team Team Cell Phone: [phone] Please utilize secure chat with additional questions, if there is no response within 30 minutes please call the above phone number  Palliative Medicine Team providers are available by phone from 7am to 7pm daily and can be reached through the team cell phone.  Should this patient require assistance outside of these hours, please call the patient's attending physician.

## 2021-08-24 NOTE — Progress Notes (Signed)
PHARMACY NOTE:  ANTIMICROBIAL RENAL DOSAGE ADJUSTMENT  Current antimicrobial regimen includes a mismatch between antimicrobial dosage and estimated renal function.  As per policy approved by the Pharmacy & Therapeutics and Medical Executive Committees, the antimicrobial dosage will be adjusted accordingly.  Current antimicrobial dosage:  Ciprofloxacin 400 mg IV every 12 hours  Indication: Acute cholecystitis SP cholecystostomy  with IR on 5/5 with Cx with Morganella morganii/B ovatus and E coli   Renal Function:  Estimated Creatinine Clearance: 28.6 mL/min (A) (by C-G formula based on SCr of 1.55 mg/dL (H)). '[]'$      On intermittent HD, scheduled: '[]'$      On CRRT    Antimicrobial dosage has been changed to:  Ciprofloxacin 400 mg IV every 24 hours  Additional comments:  Thank you for allowing pharmacy to be a part of this patient's care.  Alycia Rossetti, PharmD, BCPS Infectious Diseases Clinical Pharmacist 08/24/2021 8:37 AM   **Pharmacist phone directory can now be found on [email or web address] ([email or web address]).  Listed under [place].

## 2021-08-24 NOTE — Progress Notes (Signed)
PROGRESS NOTE    [name]  HEN:[id number] DOB: 08-May-1945 DOA: 08/20/2021 PCP: [name], MD    No chief complaint on file.   Brief Narrative:  [name] is a 76 y.o. female with medical history significant for acute cholecystitis status post cholecystostomy, history of pancreatic cancer, hypothyroidism, hypertension, generalized anxiety disorder, history of venous thromboembolic disease, recent hospitalization for sepsis with Morganella morganii/E. coli bacteremia who was sent to the hospital as a direct admission for evaluation of poor oral intake and severe dehydration. Patient's daughter states that since her discharge her oral intake has been very poor but over the last several days she has not had anything to eat or drink due to persistent nausea and vomiting despite taking antiemetics as well as poor appetite.  She complains of feeling very weak and tired and has been unable to get out of bed for the last 2 days.  She feels her legs are very heavy and unable to bear her weight.  She denies having any falls. She complains of abdominal pain mostly in the left lumbar area which she rates a 5 x 10 in intensity at its worst.  But denies having any changes in her bowel habits.  She denies having any fever, no chills, no urinary symptoms, no dizziness, no lightheadedness, no chest pain, no shortness of breath, no headache, no blurred vision or focal deficit. Her daughter is concerned about her progressive decline and inability to participate in physical therapy due to weakness.    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   -Encourage oral intake.   -Follow.  Cholecystitis Status post cholecystostomy tube insertion In addition, had perihepatic drain placed Saw IR on 5/31 and plan for capping trial with return for imaging in 2 weeks Her symptoms seem to have worsened after this trial, unclear if this is related? CT 08/16/21 with stable CT, small bilateral effusions, numerous mets within liver, drainage catheter within right perihepatic fluid collection and in the gallbladder, pancreatic body mass c/w known pancreatic malignancy, large stool burden IR consulted to see about reattaching bags. With E. coli and Morganella morganii bacteremia Was on antibiotic therapy with cefepime and Flagyl. Patient with acute metabolic encephalopathy on 08/23/2021 and noted to be pocketing pills and also refusing some of her medications and as such oral Flagyl changed to IV Flagyl.  -Also due to concerns that cefepime may be contributing to metabolic encephalopathy cefepime changed to IV ciprofloxacin per ID.   ID following and appreciate input and recommendations.  Acute metabolic encephalopathy - Patient noted to be drowsy, confusion, noted to have some myoclonic jerking on 08/23/2021.   -Likely multifactorial secondary to medications including narcotics, benzos in the setting of AKI. -Patient noted to have received Xanax and Remeron at the same time the evening of 08/22/2021, as well as oxycodone  1 to 2 hours prior to that. -Head CT done unremarkable.   -EEG done was negative for seizures.  -Ammonia level within normal limits. -Discontinued Remeron and decreased oxycodone to 1 tablet every 6 hours as  needed. -Vitamin B1 level pending. -Cefepime discontinued. -Patient started on high-dose IV thiamine per neurology recommendations. -Neurology consulted and following.  AKI (acute kidney injury) ([place]) -IV fluids held due to mild swelling noted.   -Swelling likely secondary to severe hypoalbuminemia.   Right hand with ring stuck on ring finger, Dr. Florene Glen discussed with RN to help with removal Renal US -> without hydro -renal function was initially improving however trending back up likely secondary to poor oral intake.  -Urinalysis ordered however patient refused and not done. -Renal function trending up. -Continue IV fluids with bicarb drip. -Normal saline 500 cc bolus x1 -Avoid nephrotoxins. -Supportive care.  Weakness Secondary to poor oral intake as well as multiple comorbid conditions. Possibly related to capping trial? Follow symptoms after this. -Gentle hydration. PT/OT evals  Diet-controlled diabetes mellitus ([place]) 6.3 a1c 06/2021 -Blood glucose level of 126 this morning on lab work.   -Continue bicarb drip.   -Supportive care.    Essential hypertension -Controlled on current regimen of amlodipine.   -Patient with altered mental status drowsy with poor oral intake and pocketing medications. -Mental status improved today and amlodipine resumed.   -Follow.  Acquired hypothyroidism Stable Continue Synthroid Outpatient follow-up.  Decubitus skin ulcer     Oral thrush - Patient with some concerns for oral thrush. -Placed on nystatin swish and swallow.  Protein-calorie malnutrition, severe ([place]) - Patient with severe protein calorie malnutrition with albumin level < 1.5. -Likely secondary to metastatic pancreatic cancer in the setting of poor oral intake secondary to nausea. -Placed on scheduled Zofran 3 times daily x 24 hours. -Status post IV albumin every 6 hours x24 hours. -Encourage oral intake. -Due to acute metabolic encephalopathy with drowsiness and  lethargy with some myoclonic jerks Remeron discontinued.  -Patient seen by speech therapist and patient placed on a regular diet with thin liquids.  -Marinol ordered for appetite stimulant per palliative care however patient refusing.  -Encourage p.o. intake.   -Supportive care.  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 Benay Spice during this hospitalization.         DVT prophylaxis: Eliquis Code Status: DNR Family Communication: Updated daughter at bedside. Disposition: Likely home with home health therapies  Status is: Inpatient Remains inpatient appropriate because: Severity of illness   Consultants:  Interventional radiology Infectious disease: Dr. Candiss Norse 08/21/2021 Palliative care: Dr. Rowe Pavy 08/21/2021 Oncology informed via epic of admission. Neurology: Dr. Leonel Ramsay 08/23/2021  Procedures:  CT abdomen and pelvis 08/21/2021 Chest x-ray 08/20/2021 Renal ultrasound 08/21/2021 CT head 08/23/2021 EEG 08/23/2021 CT head 08/23/2021    Antimicrobials:  Oral Flagyl 08/20/2021>>>> IV Flagyl 08/23/2021>>>> IV cefepime 08/21/2021>>>> 08/23/2021 IV ciprofloxacin 08/23/2021>>>    Subjective: Patient more alert today, following commands, denies any chest pain.  No shortness of breath.  No abdominal pain.  Noted to be refusing most of her care per RN and refusing to take oral medications today.  No further myoclonic jerking noted.  Daughter, husband and sister-in-law at bedside.   .  Objective: Vitals:   08/23/21 2139 08/24/21 0177 08/24/21 9390  08/24/21 1624  BP: 131/90 (!) 121/46 (!) 141/74 134/85  Pulse: (!) 101 93 90 96  Resp: '16 18 17 17  '$ Temp: 97.7 F (36.5 C) 98.1 F (36.7 C) 98 F (36.7 C) 98.3 F  (36.8 C)  TempSrc: Oral Oral Oral Oral  SpO2: 99% 97% 98% 98%  Weight:      Height:        Intake/Output Summary (Last 24 hours) at 08/24/2021 1925 Last data filed at 08/24/2021 1748 Gross per 24 hour  Intake 2526.6 ml  Output 20 ml  Net 2506.6 ml   Filed Weights   08/20/21 2200  Weight: 72.8 kg    Examination:  General exam: Alert and oriented to self and place.  Dry mucous membranes. ?  Oral thrush. Respiratory system: Lungs clear to auscultation anterior lung fields.  No wheezes, no crackles, no rhonchi.  Normal respiratory effort. Cardiovascular system: RRR no murmurs rubs or gallops.  No JVD.  No lower extremity edema. Gastrointestinal system: Abdomen is nondistended, soft and nontender. No organomegaly or masses felt. Normal bowel sounds heard.  Cholecystostomy tube noted right upper quadrant with drain in place.  Right hepatic drain also noted in place. Central nervous system: Patient alert today to self and place.  Moving extremities spontaneously.   Extremities: No clubbing cyanosis or edema. Skin: No rashes, lesions or ulcers Psychiatry: Judgement and insight appear fair.  Mood and affect flat    Data Reviewed:   CBC: Recent Labs  Lab 08/20/21 1122 08/21/21 0317 08/22/21 0425 September 20, 2021 0456 08/24/21 0342  WBC 12.5* 8.4 7.3 4.9 8.0  NEUTROABS  --   --  4.7 3.4 6.4  HGB 13.1 10.7* 10.3* 9.2* 9.8*  HCT 40.0 32.6* 31.6* 28.5* 29.7*  MCV 94.6 96.7 97.2 98.3 96.4  PLT 272 160 146* 103* 105*    Basic Metabolic Panel: Recent Labs  Lab 08/20/21 1122 08/21/21 0317 08/22/21 0425 09-20-2021 0456 08/24/21 0342  NA 133* 134* 132* 134* 134*  K 4.0 3.9 3.9 3.5 3.4*  CL 104 108 106 111 107  CO2 19* 19* 19* 18* 22  GLUCOSE 153* 116* 91 93 129*  BUN 31* 35* 38* 39* 40*  CREATININE 1.05* 1.13* 1.32* 1.48* 1.55*  CALCIUM 8.6 8.1* 8.2* 8.6* 8.1*  MG  --   --  2.1  --   --   PHOS  --   --  4.6  --  3.1    GFR: Estimated Creatinine Clearance: 28.6 mL/min (A) (by C-G  formula based on SCr of 1.55 mg/dL (H)).  Liver Function Tests: Recent Labs  Lab 08/21/21 0317 08/22/21 0425 2021-09-20 0456 08/24/21 0342  AST 34 36 205*  --   ALT 13 14 35  --   ALKPHOS 340* 340* 637*  --   BILITOT 0.6 0.6 1.6*  --   PROT 5.3* 5.2* 5.3*  --   ALBUMIN <1.5* <1.5* 2.6* 2.1*    CBG: No results for input(s): "GLUCAP" in the last 168 hours.   No results found for this or any previous visit (from the past 240 hour(s)).       Radiology Studies: EEG adult  Result Date: 09-20-21 Lora Havens, MD     Sep 20, 2021  8:07 PM Patient Name: [name] MRN: [id number] Epilepsy Attending: Lora Havens Referring Physician/Provider: Eugenie Filler, MD Date: 09-20-21 Duration: 28.24 mins Patient history: 76yo F with ams and continuous jerking movements of both arms. EEG to evaluate for seizure. Level of alertness: lethargic  AEDs during EEG study: None Technical aspects: This EEG study was done with scalp electrodes positioned according to the 10-20 International system of electrode placement. Electrical activity was acquired at a sampling rate of '500Hz'$  and reviewed with a high frequency filter of '70Hz'$  and a low frequency filter of '1Hz'$ . EEG data were recorded continuously and digitally stored. Description: No clear posterior dominant rhythm was seen. EEG showed continuous generalized polymorphic 3-'5Hz'$  theta-delta slowing. Generalized periodic discharges with triphasic morphology at 1-1.'5Hz'$  were also noted, more prominent when awake/stimulated. Hyperventilation and photic stimulation were not performed.    ABNORMALITY - Periodic discharges with triphasic morphology, generalized ( GPDs) - Continuous slow, generalized  IMPRESSION: This study showed generalized periodic discharges with triphasic morphology. This EEG pattern is on the ictal-interictal continuum. However the morphology, frequency and reactivity to stimulation is more commonly indicative of toxic-metabolic causes  like hyperammonemia, cefepime toxicity. Additionally there is moderate to severe diffuse encephalopathy. No seizures were seen throughout the recording.  If patient remains altered, can consider prolonged EEG monitoring.  Priyanka Barbra Sarks    CT HEAD WO CONTRAST (5MM)  Result Date: 08/23/2021 CLINICAL DATA:  Altered mental status EXAM: CT HEAD WITHOUT CONTRAST TECHNIQUE: Contiguous axial images were obtained from the base of the skull through the vertex without intravenous contrast. RADIATION DOSE REDUCTION: This exam was performed according to the departmental dose-optimization program which includes automated exposure control, adjustment of the mA and/or kV according to patient size and/or use of iterative reconstruction technique. COMPARISON:  06/01/2016 FINDINGS: Brain: No acute intracranial findings are seen in noncontrast CT brain. There are no signs of bleeding. Cortical sulci are prominent. There is decreased density in the periventricular white matter. Vascular: Unremarkable. Skull: Unremarkable. Sinuses/Orbits: Unremarkable. Other: No significant interval changes are noted. IMPRESSION: No acute intracranial findings are seen in noncontrast CT brain. Atrophy. Small-vessel disease. Electronically Signed   By: Elmer Picker M.D.   On: 08/23/2021 13:11        Scheduled Meds:  amLODipine  5 mg Oral Daily   apixaban  5 mg Oral BID   bisacodyl  10 mg Rectal Once   bisacodyl  10 mg Rectal Daily   dronabinol  5 mg Oral BID AC   levothyroxine  125 mcg Oral QAC breakfast   multivitamin with minerals  1 tablet Oral Daily   nystatin  5 mL Oral QID   pantoprazole (PROTONIX) IV  40 mg Intravenous Q24H   polyethylene glycol  17 g Oral Daily   sodium chloride flush  5 mL Intracatheter Daily   sodium chloride flush  5 mL Intracatheter Daily   [START ON 09/01/2021] thiamine injection  100 mg Intravenous Daily   Continuous Infusions:  ciprofloxacin 400 mg (08/24/21 1247)   metronidazole 500 mg  (08/24/21 1554)   sodium bicarbonate 150 mEq in dextrose 5 % 1,150 mL infusion 100 mL/hr at 08/24/21 1333   thiamine injection 500 mg (08/24/21 1523)   Followed by   Derrill Memo ON 08/26/2021] thiamine injection       LOS: 4 days    Time spent: 45 minutes    Irine Seal, MD [facility]   To contact the attending provider between 7A-7P or the covering provider during after hours 7P-7A, please log into the web site [email or web address] and access using universal [place] password for that web site. If you do not have the password, please call the hospital operator.  08/24/2021, 7:25 PM

## 2021-08-24 NOTE — Progress Notes (Addendum)
Patient has refused all day today to move or turn.  Patient states that it hurts so much, skin with generalized sensitivity to touch.  Patient said she will try to have a bath and have bed changed tomorrow.

## 2021-08-24 NOTE — Progress Notes (Addendum)
HEMATOLOGY-ONCOLOGY PROGRESS NOTE  ASSESSMENT AND PLAN: Adenocarcinoma the pancreas body, stage IV (cT4,cN0,pM1) Ultrasound abdomen 08/18/2020-possible hypoechoic pancreas body mass, small hypoechoic liver areas MRI abdomen 08/27/2020-pancreas body mass, multiple hepatic lesions consistent with metastases, right abdominal omental nodularity, pancreas mass extends to the celiac bifurcation and abuts the splenic vein and splenoportal confluence CT chest 09/07/2020-scattered pulmonary nodules concerning for metastases, pancreas body mass, hepatic metastases Ultrasound-guided biopsy of a right liver lesion 09/08/2020-adenocarcinoma consistent with a pancreas primary CT abdomen/pelvis 09/24/2020-pancreas neck mass, omental and peritoneal nodularity-unchanged, multiple hypoenhancing liver masses-unchanged, numerous small pulmonary nodules Cycle 1 gemcitabine/Abraxane 10/04/2020 Cycle 2 gemcitabine/Abraxane 10/27/2020 Cycle 3 gemcitabine/Abraxane 11/11/2018 Cycle 4 gemcitabine/Abraxane 11/23/2020 Cycle 5 gemcitabine/Abraxane 12/08/2020 CT abdomen/pelvis 12/18/2020-stable pancreas mass, stable and improved liver lesions, resolution of omental soft tissue density, stable indeterminate lung nodules, no disease progression Cycle 6 gemcitabine/Abraxane 12/22/2020 Cycle 7 gemcitabine 01/05/2021, 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  Cycle 8 gemcitabine/Abraxane 01/19/2021 Cycle 9 gemcitabine/Abraxane 02/01/2021 Cycle 10 Gemcitabine 02/16/2021, Abraxane held due to increased neuropathy Cycle 11 Gemcitabine 03/05/2021, 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 Cycle 12 gemcitabine 03/14/2021, 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 Cycle 13 gemcitabine 04/02/2021, 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 Cycle 14 gemcitabine/Abraxane 04/16/2021, Abraxane resumed at a reduced dose Cycle 15 gemcitabine/Abraxane 04/30/2021 CT abdomen/pelvis 05/08/2021-stable pancreas mass, liver metastases are slightly smaller, stable tiny bilateral lower lung nodules Cycle  16 gemcitabine/Abraxane 05/14/2021, Abraxane escalated back to 100 mg per metered squared Cycle 17 gemcitabine/Abraxane 05/28/2021 Cycle 18 gemcitabine/Abraxane 06/11/2021 Cycle 19 gemcitabine/Abraxane 07/09/2021 CT Abdo/pelvis 07/18/2021-diffuse gallbladder wall thickening and edema, small volume ascites, stable pulmonary nodules, indeterminate 15 x 18 mm right liver lesion, ill-defined hypodensity in the body of the pancreas 07/24/2021-right pleural fluid cytology-negative   2.  Pain secondary #1 3.  Diabetes 4.  Hypertension 5.  Osteoarthritis 6.  Port-A-Cath placement 09/22/2020 7.  Admission with jaundice 09/24/2020.  MRI-new abrupt constriction of the common hepatic duct.  The infiltrative pancreatic mass extends medially in the porta hepatis to obstruct the common hepatic duct.  Multiple right hepatic lobe metastases mildly increased in size.  Stent placed into the common bile duct 09/28/2020. 8.  COVID-19 06/20/2021, admission with COVID and bacterial pneumonia?  06/30/2021-completed course of antibiotics 9.  Hospital admission 07/18/2021-bacteremia, UTI, acute cholecystitis Cholecystostomy tube 07/20/2021 CT abdomen/pelvis 07/24/2021-moderate right pleural effusion, new subcapsular fluid collections adjacent to the right liver, interval cholecystostomy tube, stable pancreas body mass, incidental pulmonary embolism and a left lower lobe pulmonary artery Drain in perihepatic fluid collection 07/27/2021 10.  Pulmonary embolism/bilateral DVTs CT abdomen/pelvis 07/24/2021-incidental left lower lobe pulmonary embolism CT chest 07/24/2021-segmental and subsegmental bilateral pulmonary emboli Dopplers 07/24/2021-DVTs in the right common femoral, left common femoral, left profunda veins Heparin 07/24/2021, converted to apixaban on discharge 08/01/2021 11.  Bilateral foot numbness developing during hospital admission May 2023-Abraxane neuropathy? [facility] admission 08/20/2021-dehydration, weakness  Ms. Speece is more  alert today.  She remains extremely fatigued and weak.  She is still not taking in much p.o.  She is not a candidate for systemic chemotherapy at this time due to her overall poor performance status.  This was discussed with her daughter this morning and again this afternoon with her husband and sister-in-law.  We had some initial discussions today regarding consideration of hospice if her performance status does not improve.  She did not want to fully engage in this conversation.  Review of her CT scan shows that she has stable disease compared to a CT performed about 1 week ago but evidence of  disease progression compared to about a month ago  Recommend continuation of IV fluids and close monitoring of renal function.  She has persistent tremors and neurology following.  This may be related to opioids and Remeron.  Recommendations: 1.  Continue IV fluids and close monitoring of renal function per primary team. 2.  Antibiotics per ID. 3.  Work-up of altered mental status and tremors per neurology. 4.  Recommend PT/OT evaluation and out of bed as much as possible. 5.  Hospice referral if her status does not improve over the next few days 6.  Please call oncology as needed over the weekend.  I will check on her 08/27/2021.  [name] I saw Ms. Arvin early this morning.  Her daughter was at the bedside.  She was lethargic and arousable.  She follows simple commands, but spoke only a few words.  She has developed altered mental status over the past several days.  She has a history of a tremor, but this has progressed.  It is possible the neurologic symptoms are related to delirium from critical illness, polypharmacy, or metastatic disease to the CNS. Her daughter understands Ms. Glassner will not be a candidate for further systemic therapy unless her performance status improves.  I will see her 08/27/2021.  I was present for greater than 50% of today's visit.  I performed medical decision  making.  SUBJECTIVE: Ms. Surratt is more alert today.  Her husband and sister-in-law are at the bedside.  She still has some tremors.  She is not currently having any pain.  Refused to work with therapy this afternoon.  Oncology History Overview Note  Cancer Staging Primary pancreatic cancer with metastasis to other site [facility]) Staging form: Exocrine Pancreas, AJCC 8th Edition - Clinical stage from 09/08/2020: Stage IV (cT4, cN0, pM1) - Signed by Truitt Merle, MD on 09/12/2020 Stage prefix: Initial diagnosis Total positive nodes: 0    Primary pancreatic cancer with metastasis to other site ([place])  08/27/2020 Imaging   Abdominal MRI w wo contrast: IMPRESSION: 1. Pancreatic body infiltrative mass, favoring adenocarcinoma. 2. Multiple hepatic lesions, most consistent with metastasis. 3. Right abdominal omental nodularity, highly suspicious for peritoneal metastasis. 4. Recommend multidisciplinary oncology consultation for eventual sampling of either the liver or pancreatic lesions. 5. Vascular involvement with tumor, as detailed above.   09/07/2020 Imaging   CT chest  IMPRESSION: 1. Scattered small pulmonary nodules, worrisome for metastatic disease. 2. Pancreatic body mass and hepatic metastases, better seen and described on MR abdomen 08/27/2020. 3. Aortic atherosclerosis (ICD10-I70.0). Coronary artery calcification.   09/08/2020 Cancer Staging   Staging form: Exocrine Pancreas, AJCC 8th Edition - Clinical stage from 09/08/2020: Stage IV (cT4, cN0, pM1) - Signed by Truitt Merle, MD on 09/12/2020 Stage prefix: Initial diagnosis Total positive nodes: 0   09/08/2020 Pathology Results   A. LIVER, RIGHT, BIOPSY:  - Adenocarcinoma.  See comment.   COMMENT:   The morphology is consistent with a pancreatic primary.    09/12/2020 Initial Diagnosis   Primary pancreatic cancer with metastasis to other site [facility])   10/12/2020 -  Chemotherapy   Patient is on Treatment Plan : PANCREATIC Abraxane  / Gemcitabine D1,8,15 q28d       PHYSICAL EXAMINATION:  Vitals:   08/23/21 2139 08/24/21 0542  BP: 131/90 (!) 121/46  Pulse: (!) 101 93  Resp: 16 18  Temp: 97.7 F (36.5 C) 98.1 F (36.7 C)  SpO2: 99% 97%   Filed Weights   08/20/21 2200  Weight: 72.8  kg    Intake/Output from previous day: 06/08 0701 - 06/09 0700 In: 2094.6 [I.V.:1594.6; IV Piggyback:500] Out: 210 [Urine:200; Drains:10]  Exam: More alert, able answer questions HEENT-no thrush Cardiac-regular rate and rhythm Lungs-clear anteriorly Abdomen-two right upper abdomen drains, drainage, no mass, mildly distended Vascular-trace pedal edema Neurologic-follow simple commands, bilateral hand tremor  LABORATORY DATA:  I have reviewed the data as listed    Latest Ref Rng & Units 08/24/2021    3:42 AM 08/23/2021    4:56 AM 08/22/2021    4:25 AM  CMP  Glucose 70 - 99 mg/dL 129  93  91   BUN 8 - 23 mg/dL 40  39  38   Creatinine 0.44 - 1.00 mg/dL 1.55  1.48  1.32   Sodium 135 - 145 mmol/L 134  134  132   Potassium 3.5 - 5.1 mmol/L 3.4  3.5  3.9   Chloride 98 - 111 mmol/L 107  111  106   CO2 22 - 32 mmol/L '22  18  19   '$ Calcium 8.9 - 10.3 mg/dL 8.1  8.6  8.2   Total Protein 6.5 - 8.1 g/dL  5.3  5.2   Total Bilirubin 0.3 - 1.2 mg/dL  1.6  0.6   Alkaline Phos 38 - 126 U/L  637  340   AST 15 - 41 U/L  205  36   ALT 0 - 44 U/L  35  14     Lab Results  Component Value Date   WBC 8.0 08/24/2021   HGB 9.8 (L) 08/24/2021   HCT 29.7 (L) 08/24/2021   MCV 96.4 08/24/2021   PLT 105 (L) 08/24/2021   NEUTROABS 6.4 08/24/2021    Lab Results  Component Value Date   CAN199 16,591 (H) 08/09/2021    EEG adult  Result Date: 08/23/2021 Lora Havens, MD     08/23/2021  8:07 PM Patient Name: [name] MRN: [id number] Epilepsy Attending: Lora Havens Referring Physician/Provider: Eugenie Filler, MD Date: 08/23/2021 Duration: 28.24 mins Patient history: 76yo F with ams and continuous jerking movements of both  arms. EEG to evaluate for seizure. Level of alertness: lethargic AEDs during EEG study: None Technical aspects: This EEG study was done with scalp electrodes positioned according to the 10-20 International system of electrode placement. Electrical activity was acquired at a sampling rate of '500Hz'$  and reviewed with a high frequency filter of '70Hz'$  and a low frequency filter of '1Hz'$ . EEG data were recorded continuously and digitally stored. Description: No clear posterior dominant rhythm was seen. EEG showed continuous generalized polymorphic 3-'5Hz'$  theta-delta slowing. Generalized periodic discharges with triphasic morphology at 1-1.'5Hz'$  were also noted, more prominent when awake/stimulated. Hyperventilation and photic stimulation were not performed.    ABNORMALITY - Periodic discharges with triphasic morphology, generalized ( GPDs) - Continuous slow, generalized  IMPRESSION: This study showed generalized periodic discharges with triphasic morphology. This EEG pattern is on the ictal-interictal continuum. However the morphology, frequency and reactivity to stimulation is more commonly indicative of toxic-metabolic causes like hyperammonemia, cefepime toxicity. Additionally there is moderate to severe diffuse encephalopathy. No seizures were seen throughout the recording.  If patient remains altered, can consider prolonged EEG monitoring.  Priyanka Barbra Sarks    CT HEAD WO CONTRAST (5MM)  Result Date: 08/23/2021 CLINICAL DATA:  Altered mental status EXAM: CT HEAD WITHOUT CONTRAST TECHNIQUE: Contiguous axial images were obtained from the base of the skull through the vertex without intravenous contrast. RADIATION DOSE REDUCTION: This exam  was performed according to the departmental dose-optimization program which includes automated exposure control, adjustment of the mA and/or kV according to patient size and/or use of iterative reconstruction technique. COMPARISON:  06/01/2016 FINDINGS: Brain: No acute intracranial  findings are seen in noncontrast CT brain. There are no signs of bleeding. Cortical sulci are prominent. There is decreased density in the periventricular white matter. Vascular: Unremarkable. Skull: Unremarkable. Sinuses/Orbits: Unremarkable. Other: No significant interval changes are noted. IMPRESSION: No acute intracranial findings are seen in noncontrast CT brain. Atrophy. Small-vessel disease. Electronically Signed   By: Elmer Picker M.D.   On: 08/23/2021 13:11   CT ABDOMEN PELVIS W CONTRAST  Result Date: 08/21/2021 CLINICAL DATA:  Cholecystitis status post cholecystostomy tube placement. History of pancreatic cancer. EXAM: CT ABDOMEN AND PELVIS WITH CONTRAST TECHNIQUE: Multidetector CT imaging of the abdomen and pelvis was performed using the standard protocol following bolus administration of intravenous contrast. RADIATION DOSE REDUCTION: This exam was performed according to the departmental dose-optimization program which includes automated exposure control, adjustment of the mA and/or kV according to patient size and/or use of iterative reconstruction technique. CONTRAST:  167m OMNIPAQUE IOHEXOL 300 MG/ML  SOLN COMPARISON:  CT abdomen pelvis dated August 16, 2021. FINDINGS: Lower chest: Unchanged moderate right and small left pleural effusions with adjacent atelectasis in both posterior lower lobes. Hepatobiliary: Multiple hypodense liver lesions are unchanged. Right perihepatic drainage catheter remains in place without significant residual perihepatic fluid collection. 2.2 cm posteroinferior perihepatic fluid collection is unchanged (series 3, image 20). New small amount of pneumobilia in the left liver. Multiple gallstones again noted with slightly increased distention of the gallbladder status post cholecystostomy tube placement. Unchanged common bile duct stent. Pancreas: Unchanged hypodense mass in the pancreatic neck with atrophy of the body and tail. Spleen: Unchanged wedge-shaped  hypodensity in the inferior spleen, likely a small infarct. Normal in size. Adrenals/Urinary Tract: Adrenal glands are unremarkable. Unchanged small right renal simple cysts. No follow-up imaging is recommended. No renal calculi or hydronephrosis. The bladder is distended with excreted contrast. Stomach/Bowel: Unchanged small hiatal hernia. The stomach is otherwise within normal limits. No bowel wall thickening, distention, or surrounding inflammatory changes. Scattered mild colonic diverticulosis. Similar moderate stool burden throughout the colon. Normal appendix. 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 Aortoiliac atherosclerotic vascular disease. No enlarged abdominal or pelvic lymph nodes. Reproductive: Status post hysterectomy. No adnexal masses. Other: Decreased small volume ascites in the pelvis. No pneumoperitoneum. Musculoskeletal: No acute or significant osseous findings. Unchanged anasarca. IMPRESSION: 1. Status post cholecystostomy tube placement with slightly increased distention of the gallbladder compared to prior study. Correlate with tube function. 2. Right perihepatic drainage catheter remains in place without significant residual perihepatic fluid collection around the drain. Unchanged 2.2 cm posteroinferior perihepatic fluid collection separate from the drain. 3. 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 4. Decreased small volume ascites in the pelvis. 5. Unchanged moderate right and small left pleural effusions. 6. 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 7. Aortic Atherosclerosis (ICD10-I70.0). Electronically Signed   By: WTitus DubinM.D.   On: 08/21/2021 17:58   [place]  Result Date: 08/21/2021 CLINICAL DATA:  Acute kidney  injury EXAM: RENAL / URINARY TRACT ULTRASOUND COMPLETE COMPARISON:  CT abdomen 08/16/2021 FINDINGS: Right Kidney: Renal measurements: 9.5 by 4.0 by 5.0 cm = volume: 102  mL. Mildly hyperechoic relative to the liver. No solid mass or hydronephrosis visualized. A right kidney upper pole cyst measures 2.0 by 3.0 by 2 2.6 cm and appears simple. Left Kidney: Renal measurements: 9.7 by 4.0 by 4.8 cm = volume: 99 mL. Mildly hyperechoic relative to the spleen. No mass or hydronephrosis visualized. Bladder: Appears normal for degree of bladder distention. A left ureteral jet is visualized. Ascites in the lower abdomen. IMPRESSION: 1. Bilateral renal parenchymal hyperechogenicity suggesting chronic medical renal disease. 2. Right kidney upper pole simple appearing cyst. 3. A left ureteral jet is visualized in the urinary bladder. Right ureteral jet not seen, although this may simply be incidental. 4. Pelvic ascites. Electronically Signed   By: Van Clines M.D.   On: 08/21/2021 16:00   DG CHEST PORT 1 VIEW  Result Date: 08/20/2021 CLINICAL DATA:  Status post PICC line placement. EXAM: PORTABLE CHEST 1 VIEW COMPARISON:  07/27/2021. FINDINGS: The heart size and mediastinal contours are within normal limits. There is atherosclerotic calcification of the aorta. Patchy airspace disease is noted at the right lung base. There are questionable small bilateral pleural effusions. No pneumothorax. A right chest port is stable. No PICC line is identified. A stable pigtail catheter is noted in the right upper quadrant. No acute osseous abnormality. IMPRESSION: 1. Small bilateral pleural effusions with atelectasis at the lung bases. 2. No PICC line is identified.  Clinical correlation is recommended. Electronically Signed   By: Brett Fairy M.D.   On: 08/20/2021 23:45   CT ABDOMEN PELVIS W CONTRAST  Result Date: 08/17/2021 CLINICAL DATA:  Abdominal pain, acute, nonlocalized EXAM: CT ABDOMEN AND PELVIS WITH CONTRAST TECHNIQUE:  Multidetector CT imaging of the abdomen and pelvis was performed using the standard protocol following bolus administration of intravenous contrast. RADIATION DOSE REDUCTION: This exam was performed according to the departmental dose-optimization program which includes automated exposure control, adjustment of the mA and/or kV according to patient size and/or use of iterative reconstruction technique. CONTRAST:  72m OMNIPAQUE IOHEXOL 300 MG/ML  SOLN COMPARISON:  08/15/2021 FINDINGS: Lower chest: Small bilateral pleural effusions. Compressive atelectasis in the lower lobes. Findings stable since prior study. Hepatobiliary: Right perihepatic drainage catheter remains in place with near complete resolution of perihepatic fluid collection. Inferior perihepatic fluid collection again noted, measuring 2.3 cm, stable. Numerous hypodensities throughout the liver are again seen and unchanged compatible with metastases. Cholecystostomy tube in place. Gallbladder decompressed. Gallstone noted within the gallbladder. Common bile duct stent noted, unchanged. Pancreas: Ill-defined hypodensity in the body of the pancreas compatible with known pancreatic malignancy. Pancreatic tail atrophy. Spleen: No focal abnormality.  Normal size. Adrenals/Urinary Tract: Right upper pole renal cyst. No hydronephrosis. Adrenal glands and urinary bladder unremarkable. Stomach/Bowel: Large stool burden in the colon. Sigmoid diverticulosis. Stomach and small bowel decompressed. Vascular/Lymphatic: Aortoiliac atherosclerosis. No adenopathy or aneurysm. Reproductive: Prior hysterectomy.  No adnexal masses. Other: Moderate free fluid in the pelvis, unchanged. Musculoskeletal: No acute bony abnormality. IMPRESSION: Essentially stable CT since 2 days prior. Small bilateral pleural effusions with compressive atelectasis. Numerous metastases within the liver, stable. Perihepatic fluid collections are stable. Drainage catheter within a right perihepatic  fluid collection and in the gallbladder. Pancreatic body mass compatible with known pancreatic malignancy. Pancreatic tail atrophy. Large stool burden in the colon. Aortic atherosclerosis. Moderate free fluid in the pelvis. Electronically Signed   By: KRolm BaptiseM.D.   On: 08/17/2021 02:22   CT ABDOMEN PELVIS W CONTRAST  Result Date: 08/15/2021 CLINICAL DATA:  Briefly, 76year old  female with a history of pancreatic tumor, perihepatic fluid collection and acute cholecystitis s/p perihepatic drain and cholecystostomy drain placement. EXAM: CT ABDOMEN AND PELVIS WITH CONTRAST TECHNIQUE: Multidetector CT imaging of the abdomen and pelvis was performed using the standard protocol following bolus administration of intravenous contrast. RADIATION DOSE REDUCTION: This exam was performed according to the departmental dose-optimization program which includes automated exposure control, adjustment of the mA and/or kV according to patient size and/or use of iterative reconstruction technique. CONTRAST:  114m ISOVUE-300 IOPAMIDOL (ISOVUE-300) INJECTION 61% COMPARISON:  IR fluoroscopy, earlier same day.  CT AP, 07/24/2021. FINDINGS: Lower chest: Persistent small volume bilateral pleural effusions, RIGHT-greater-than LEFT, and adjacent dependent consolidations Hepatobiliary: *Well-positioned percutaneous drainage catheter with near-complete resolution of previously large RIGHT perihepatic fluid collection. *Trace residual collection along the hepatic dome measures up to 5.0 x 1.0 cm, previously 17 cm. *Additional inferomedial perihepatic fluid collection measures 3.0 x 1.7 cm, previously 6 cm *Multifocal hypodense lesions within the LEFT and RIGHT hepatic lobes, largest at hepatic segment VI measuring 3.5 x 2.5 cm, suspicious for hepatic metastases. *Stable positioning of cholecystostomy drain with contracted gallbladder. Metallic biliary stent. No intra or extrahepatic biliary ductal dilation. Pancreas: Normal contrast  enhancement of the pancreatic head. Similar ill-defined hypodensity within the body of pancreas and pancreatic tail atrophy. Findings compatible with known pancreatic malignancy Spleen: New area of hypodensity within the inferior splenic pole, in an area measuring 3.0 x 2.0 cm. Adrenals/Urinary Tract: Adrenal glands are unremarkable. RIGHT exophytic and renal cortical renal cysts, unchanged. The LEFT kidney is normal. No renal calculi or hydronephrosis. Bladder is unremarkable. Stomach/Bowel: Small hiatus hernia. Nonobstructed bowel. Severe burden of sigmoid diverticulosis. No evidence of bowel wall thickening, distention, or inflammatory changes. Vascular/Lymphatic: Aortic atherosclerosis without aneurysmal dilation. No enlarged abdominal or pelvic lymph nodes. Reproductive: Status post hysterectomy. No adnexal mass. Other: Small volume of residual ascites within the dependent pelvis. Musculoskeletal: Mild body wall edema. Multilevel spinal degenerative change. No interval osseous abnormality. IMPRESSION: Since CT AP dated 07/24/2021; 1. Well-positioned percutaneous drainage catheter with near-complete resolution of previously large perihepatic free fluid collection. Small volume residual inferomedial hepatic fluid collection. 2. Stable positioning of cholecystostomy drain with contracted gallbladder. 3. Metallic biliary stent without intra- or extrahepatic biliary ductal dilation. 4. Pancreatic body mass consistent with known malignancy. Increased conspicuity of multifocal hepatic lesions, as above, is suspicious for metastases. 5. New hypodensity within the inferior splenic pole, suspicious for regional hypoperfusion and splenic infarct. 6. Bilateral small volume pleural effusions, greater on RIGHT. Additional incidental, chronic and senescent findings as above. These results will be called to the ordering clinician or representative by the Radiologist Assistant, and communication documented in the PACS or [employer] Electronically Signed   By: JMichaelle BirksM.D.   On: 08/15/2021 17:41   DG Sinus/Fist Tube Chk-Non GI  Result Date: 08/15/2021 CLINICAL DATA:  Briefly, 76year old female with a history of pancreatic tumor, perihepatic fluid collection and acute cholecystitis s/p perihepatic drain and cholecystostomy drain placement. EXAM: CHOLECYSTOSTOMY DRAIN INJECTION COMPARISON:  CT AP, concurrent and 07/24/2021.  IR CT, 07/25/2021. CONTRAST:  40 mL Omnipaque 300-administered via the existing cholecystostomy drain. FLUOROSCOPY TIME:  Fluoroscopic dose; 11.3 mGy TECHNIQUE: The patient was positioned supine on the fluoroscopy table. A preprocedural spot fluoroscopic image was obtained of the RIGHT upper quadrant and the existing percutaneous cholecystostomy drainage catheter. Multiple spot fluoroscopic and radiographic images were obtained following the injection of a small amount of contrast via the existing percutaneous cholecystostomy catheter. A  contrast injection for the perihepatic drainage catheter was not performed. Procedure performed by Pasty Spillers, IR PA FINDINGS: Cholecystostomy drain injection without evidence of cystic duct patency. Metallic biliary stent. IMPRESSION: No fluoroscopic evidence of cystic duct patency. The cholecystostomy drain was therefore LEFT in place. Electronically Signed   By: Michaelle Birks M.D.   On: 08/15/2021 16:41   IR Radiologist Eval & Mgmt  Result Date: 08/15/2021 Please refer to notes tab for details about interventional procedure. (Op Note)  DG CHEST PORT 1 VIEW  Result Date: 07/27/2021 CLINICAL DATA:  Shortness of breath, chest congestion EXAM: PORTABLE CHEST 1 VIEW COMPARISON:  Chest radiograph 07/24/2021 FINDINGS: The right chest wall port is stable in position. There is a new pigtail catheter in the right upper quadrant. The cardiomediastinal silhouette is stable. The right pleural effusion has decreased in size, with improved aeration of the right lower lobe  following thoracentesis. Residual patchy opacities in the perihilar region may reflect atelectasis. There is no appreciable pneumothorax. The left lung is clear. There is no significant left effusion. There is no left pneumothorax. The bones are stable. IMPRESSION: Decreased size of the right pleural effusion with improved aeration of the right base following thoracentesis. No appreciable pneumothorax. Electronically Signed   By: Valetta Mole M.D.   On: 07/27/2021 09:46   CT IMAGE GUIDED DRAINAGE BY PERCUTANEOUS CATHETER  Result Date: 07/25/2021 CLINICAL DATA:  History of pancreatic carcinoma and development large right-sided perihepatic/subcapsular fluid collection. EXAM: CT GUIDED CATHETER DRAINAGE OF PERIHEPATIC ABSCESS ANESTHESIA/SEDATION: Moderate (conscious) sedation was employed during this procedure. A total of Versed 1.0 mg and Fentanyl 75 mcg was administered intravenously by radiology nursing. Moderate Sedation Time: 18 minutes. The patient's level of consciousness and vital signs were monitored continuously by radiology nursing throughout the procedure under my direct supervision. PROCEDURE: The procedure, risks, benefits, and alternatives were explained to the patient. Questions regarding the procedure were encouraged and answered. The patient understands and consents to the procedure. A time out was performed prior to initiating the procedure. CT was performed through the upper abdomen in a supine position. The abdominal wall was prepped with chlorhexidine in a sterile fashion, and a sterile drape was applied covering the operative field. A sterile gown and sterile gloves were used for the procedure. Local anesthesia was provided with 1% Lidocaine. An 18 gauge trocar needle was advanced into a right lateral perihepatic/subcapsular fluid collection. After confirming needle tip position, a guidewire was advanced into the collection. The needle was removed and the tract dilated over the wire. A 12  French percutaneous drainage catheter was advanced over the wire. Catheter position was confirmed by CT. A fluid sample was withdrawn and sent for culture analysis. The catheter was attached to suction bulb drainage and secured at the skin with a Prolene retention suture and adhesive StatLock device. COMPLICATIONS: None FINDINGS: Large right-sided perihepatic fluid collection again noted. Aspiration yielded clear yellow fluid. After drainage catheter placement there is abundant return of fluid. IMPRESSION: CT-guided percutaneous catheter drainage of large right lateral perihepatic/subcapsular fluid collection. A sample of clear yellow fluid was sent for culture analysis. A 12 French drainage catheter was placed and attached to suction bulb drainage. RADIATION DOSE REDUCTION: This exam was performed according to the departmental dose-optimization program which includes automated exposure control, adjustment of the mA and/or kV according to patient size and/or use of iterative reconstruction technique. Electronically Signed   By: Aletta Edouard M.D.   On: 07/25/2021 11:43     Future  Appointments  Date Time Provider [place]  08/28/2021  1:20 PM [facility] CT 1 [facility]  08/28/2021  2:00 PM [facility] DG 1 ([place]) [facility]  08/28/2021  2:00 PM [facility] IR [facility]  08/29/2021  3:00 PM Mignon Pine, DO [facility]  10/17/2021  3:00 PM [name], MD [facility]  12/10/2021 11:45 AM [facility] HEALTH COACH [facility]      LOS: 4 days

## 2021-08-24 NOTE — Progress Notes (Signed)
Attempted two times to change and/or turn patient but patient refused on both times.  Patient refused to move due to pain.  Husband and daughter in the room.

## 2021-08-24 NOTE — Assessment & Plan Note (Signed)
-   Patient with some concerns for oral thrush. -Placed on nystatin swish and swallow.

## 2021-08-25 DIAGNOSIS — E039 Hypothyroidism, unspecified: Secondary | ICD-10-CM | POA: Diagnosis not present

## 2021-08-25 DIAGNOSIS — E860 Dehydration: Secondary | ICD-10-CM | POA: Diagnosis not present

## 2021-08-25 DIAGNOSIS — N179 Acute kidney failure, unspecified: Secondary | ICD-10-CM | POA: Diagnosis not present

## 2021-08-25 DIAGNOSIS — K819 Cholecystitis, unspecified: Secondary | ICD-10-CM | POA: Diagnosis not present

## 2021-08-25 LAB — CBC
HCT: 31.9 % — ABNORMAL LOW (ref 36.0–46.0)
Hemoglobin: 10.4 g/dL — ABNORMAL LOW (ref 12.0–15.0)
MCH: 31.6 pg (ref 26.0–34.0)
MCHC: 32.6 g/dL (ref 30.0–36.0)
MCV: 97 fL (ref 80.0–100.0)
Platelets: 98 10*3/uL — ABNORMAL LOW (ref 150–400)
RBC: 3.29 MIL/uL — ABNORMAL LOW (ref 3.87–5.11)
RDW: 19 % — ABNORMAL HIGH (ref 11.5–15.5)
WBC: 9.3 10*3/uL (ref 4.0–10.5)
nRBC: 0.2 % (ref 0.0–0.2)

## 2021-08-25 LAB — URINALYSIS, ROUTINE W REFLEX MICROSCOPIC
Bilirubin Urine: NEGATIVE
Glucose, UA: NEGATIVE mg/dL
Ketones, ur: 5 mg/dL — AB
Nitrite: NEGATIVE
Protein, ur: >=300 mg/dL — AB
Specific Gravity, Urine: 1.043 — ABNORMAL HIGH (ref 1.005–1.030)
WBC, UA: >50 WBC/hpf — ABNORMAL HIGH (ref 0–5)
pH: 5 (ref 5.0–8.0)

## 2021-08-25 LAB — COMPREHENSIVE METABOLIC PANEL
ALT: 23 U/L (ref 0–44)
AST: 46 U/L — ABNORMAL HIGH (ref 15–41)
Albumin: 1.9 g/dL — ABNORMAL LOW (ref 3.5–5.0)
Alkaline Phosphatase: 593 U/L — ABNORMAL HIGH (ref 38–126)
Anion gap: 4 — ABNORMAL LOW (ref 5–15)
BUN: 40 mg/dL — ABNORMAL HIGH (ref 8–23)
CO2: 23 mmol/L (ref 22–32)
Calcium: 7.9 mg/dL — ABNORMAL LOW (ref 8.9–10.3)
Chloride: 105 mmol/L (ref 98–111)
Creatinine, Ser: 1.65 mg/dL — ABNORMAL HIGH (ref 0.44–1.00)
GFR, Estimated: 32 mL/min — ABNORMAL LOW (ref >60)
Glucose, Bld: 154 mg/dL — ABNORMAL HIGH (ref 70–99)
Potassium: 3.9 mmol/L (ref 3.5–5.1)
Sodium: 132 mmol/L — ABNORMAL LOW (ref 135–145)
Total Bilirubin: 0.7 mg/dL (ref 0.3–1.2)
Total Protein: 5.2 g/dL — ABNORMAL LOW (ref 6.5–8.1)

## 2021-08-25 LAB — SODIUM, URINE, RANDOM: Sodium, Ur: 14 mmol/L

## 2021-08-25 LAB — PHOSPHORUS: Phosphorus: 3.1 mg/dL (ref 2.5–4.6)

## 2021-08-25 LAB — MAGNESIUM: Magnesium: 2 mg/dL (ref 1.7–2.4)

## 2021-08-25 LAB — CREATININE, URINE, RANDOM: Creatinine, Urine: 140.56 mg/dL

## 2021-08-25 MED ORDER — SODIUM CHLORIDE 0.9 % IV BOLUS
1000.0000 mL | Freq: Once | INTRAVENOUS | Status: AC
  Administered 2021-08-25: 1000 mL via INTRAVENOUS

## 2021-08-25 MED ORDER — PROCHLORPERAZINE EDISYLATE 10 MG/2ML IJ SOLN: 5.0000 mg | Freq: Four times a day (QID) | INTRAMUSCULAR | Status: DC | PRN

## 2021-08-25 MED ORDER — SODIUM CHLORIDE 0.9 % IV SOLN: INTRAVENOUS | Status: DC

## 2021-08-25 MED ORDER — SORBITOL 70 % SOLN
30.0000 mL | Freq: Once | Status: AC
Start: 2021-08-25 — End: 2021-08-25
  Administered 2021-08-25: 30 mL via ORAL
  Filled 2021-08-25: qty 30

## 2021-08-25 NOTE — Progress Notes (Signed)
Speech Language Pathology Treatment: Dysphagia  Patient Details Name: [name] MRN: [id number] DOB: 1945/06/08 Today's Date: 08/25/2021 Time: 5009-3818 SLP Time Calculation (min) (ACUTE ONLY): 13 min  Assessment / Plan / Recommendation Clinical Impression  Followed up for diet tolerance. Pt unfortunately continues to have poor appetite as expected. SLP observed pt during RN administering of meds whole in puree. Pt also assessed with thin liquids. No overt s/sx of aspiration. No further swallowing needs identified. Continue preference regular thin liquid diet with aspiration precautions.    HPI HPI: Pt is a 76 y.o. female who presented 08/20/21 for dehydration with hyponatremia, cholecystitis, AKI, and weakness with poor oral intake. Swallow eval was ordered 6/8 when pt was noted to be orally holding pills and too weak to take a sip of water per RN note. CT head 6/8 negative. EEG 6/8: generalized periodic discharges with triphasic morphology on the ictal-interictal continuum, commonly indicative of toxic-metabolic causes like hyperammonemia, cefepime toxicity, moderate to severe diffuse encephalopathy also noted. PMT discussions ongoing regarding [place]. PMH: COVID-19, T2DM, HLD, HTN, hx of TIA, depression/anxiety, metastatic pancreatic cancer currently undergoing chemotherapy, s/p cholecystostomy by IR 5/5      SLP Plan  Discharge SLP treatment due to (comment) (no further needs identified)      Recommendations for follow up therapy are one component of a multi-disciplinary discharge planning process, led by the attending physician.  Recommendations may be updated based on patient status, additional functional criteria and insurance authorization.    Recommendations  Diet recommendations: Regular;Thin liquid Medication Administration: Whole meds with puree Compensations: Slow rate;Small sips/bites                Oral Care Recommendations: Oral care BID Follow Up Recommendations:  No SLP follow up SLP Visit Diagnosis: Dysphagia, unspecified (R13.10) Plan: Discharge SLP treatment due to (comment) (no further needs identified)           Hayden Rasmussen. MA, CCC-SLP Acute Rehabilitation Services    08/25/2021, 4:10 PM

## 2021-08-25 NOTE — Progress Notes (Signed)
PROGRESS NOTE    [name]  TJQ:[id number] DOB: 1945/04/18 DOA: 08/20/2021 PCP: [name], MD    No chief complaint on file.   Brief Narrative:  [name] is a 76 y.o. female with medical history significant for acute cholecystitis status post cholecystostomy, history of pancreatic cancer, hypothyroidism, hypertension, generalized anxiety disorder, history of venous thromboembolic disease, recent hospitalization for sepsis with Morganella morganii/E. coli bacteremia who was sent to the hospital as a direct admission for evaluation of poor oral intake and severe dehydration. Patient's daughter states that since her discharge her oral intake has been very poor but over the last several days she has not had anything to eat or drink due to persistent nausea and vomiting despite taking antiemetics as well as poor appetite.  She complains of feeling very weak and tired and has been unable to get out of bed for the last 2 days.  She feels her legs are very heavy and unable to bear her weight.  She denies having any falls. She complains of abdominal pain mostly in the left lumbar area which she rates a 5 x 10 in intensity at its worst.  But denies having any changes in her bowel habits.  She denies having any fever, no chills, no urinary symptoms, no dizziness, no lightheadedness, no chest pain, no shortness of breath, no headache, no blurred vision or focal deficit. Her daughter is concerned about her progressive decline and inability to participate in physical therapy due to weakness.    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   -Encourage oral intake.   -Follow.  Cholecystitis Status post cholecystostomy tube insertion In addition, had perihepatic drain placed Saw IR on 5/31 and plan for capping trial with return for imaging in 2 weeks Her symptoms seem to have worsened after this trial, unclear if this is related? CT 08/16/21 with stable CT, small bilateral effusions, numerous mets within liver, drainage catheter within right perihepatic fluid collection and in the gallbladder, pancreatic body mass c/w known pancreatic malignancy, large stool burden IR consulted to see about reattaching bags. With E. coli and Morganella morganii bacteremia Was on antibiotic therapy with cefepime and Flagyl. Patient with acute metabolic encephalopathy on 08/23/2021 and noted to be pocketing pills and also refusing some of her medications and as such oral Flagyl changed to IV Flagyl.  -Also due to concerns that cefepime may be contributing to metabolic encephalopathy cefepime changed to IV ciprofloxacin per ID.   ID following and appreciate input and recommendations.  Acute metabolic encephalopathy - Patient noted to be drowsy, confusion, noted to have some myoclonic jerking on 08/23/2021.   -Likely multifactorial secondary to medications including narcotics, benzos in the setting of AKI. -Patient noted to have received Xanax and Remeron at the same time the evening of 08/22/2021, as well as  oxycodone 1 to 2 hours prior to that. -Head CT done unremarkable.   -EEG done was negative for seizures.  -Ammonia level within normal limits. -Discontinued Remeron and decreased oxycodone to 1 tablet every 6 hours  as needed. -Vitamin B1 level pending. -Cefepime discontinued. -Patient started on high-dose IV thiamine per neurology recommendations. -Patient with clinical improvement, alert, following commands appropriately. -Neurology consulted and following.  AKI (acute kidney injury) ([place]) -IV fluids held due to mild swelling noted.   -Swelling likely secondary to severe hypoalbuminemia.   Right hand with ring stuck on ring finger, Dr. Florene Glen discussed with RN to help with removal Renal US -> without hydro -renal function was initially improving however trending back up likely secondary to poor oral intake.  -Urinalysis ordered however patient refused and not done. -Renal function trending up. -Urine sodium noted at 14. -Continue IV fluids. -Follow. -Avoid nephrotoxins. -Supportive care.  Weakness Secondary to poor oral intake as well as multiple comorbid conditions. Possibly related to capping trial? Follow symptoms after this. -Gentle hydration. PT/OT evals  Diet-controlled diabetes mellitus ([place]) 6.3 a1c 06/2021 -Blood glucose level of 154 this morning on lab work.   -Continue bicarb drip.   -Supportive care.    Essential hypertension -Controlled on current regimen of amlodipine.   -Patient with altered mental status drowsy with poor oral intake and pocketing medications. -Mental status improved today and amlodipine resumed.   -Follow.  Acquired hypothyroidism Stable. -Synthroid. Outpatient follow-up.  Decubitus skin ulcer     Oral thrush - Patient with some concerns for oral thrush. -Continue nystatin swish and swallow.  Protein-calorie malnutrition, severe ([place]) - Patient with severe protein calorie malnutrition with albumin level < 1.5. -Likely secondary to metastatic pancreatic cancer in the setting of poor oral intake secondary to nausea. -Placed on scheduled Zofran 3 times daily x 24 hours. -Status post IV albumin every 6 hours x24 hours. -Encourage oral  intake. -Due to acute metabolic encephalopathy with drowsiness and lethargy with some myoclonic jerks Remeron discontinued.  -Patient seen by speech therapist and patient placed on a regular diet with thin liquids.  -Marinol ordered for appetite stimulant per palliative care however patient refusing.  -Encourage p.o. intake.   -Supportive care.  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 Benay Spice during this hospitalization.         DVT prophylaxis: Eliquis Code Status: DNR Family Communication: Updated husband at bedside.  Updated daughter over the telephone. Disposition: Likely home with home health therapies  Status is: Inpatient Remains inpatient appropriate because: Severity of illness   Consultants:  Interventional radiology Infectious disease: Dr. Candiss Norse 08/21/2021 Palliative care: Dr. Rowe Pavy 08/21/2021 Oncology informed via epic of admission. Neurology: Dr. Leonel Ramsay 08/23/2021  Procedures:  CT abdomen and pelvis 08/21/2021 Chest x-ray 08/20/2021 Renal ultrasound 08/21/2021 CT head 08/23/2021 EEG 08/23/2021 CT head 08/23/2021    Antimicrobials:  Oral Flagyl 08/20/2021>>>> IV Flagyl 08/23/2021>>>> IV cefepime 08/21/2021>>>> 08/23/2021 IV ciprofloxacin 08/23/2021>>>    Subjective: Patient alert.  Denies any chest pain.  No shortness of breath.  No abdominal pain.  Willing to take her medications today.  Did not have breakfast this morning however states will try lunch.  Husband at bedside.  No bowel movement.   .  Objective: Vitals:   08/24/21 1624 08/24/21  2130 08/25/21 0801 08/25/21 1303  BP: 134/85 138/82 (!) 150/94 130/83  Pulse: 96 (!) 101 95 99  Resp: '17 16 16 16  '$ Temp: 98.3 F (36.8 C)  97.8 F (36.6 C) 98.2 F (36.8 C) 98.4 F (36.9 C)  TempSrc: Oral Oral Oral Oral  SpO2: 98% 98% 97% 97%  Weight:      Height:        Intake/Output Summary (Last 24 hours) at 08/25/2021 1912 Last data filed at 08/25/2021 0350 Gross per 24 hour  Intake 971.67 ml  Output 100 ml  Net 871.67 ml   Filed Weights   08/20/21 2200  Weight: 72.8 kg    Examination:  General exam: Alert.  Dry mucous membranes.  Oral thrush.  Respiratory system: CTA B anterior lung fields.  No wheezes, no crackles, no rhonchi.  Normal respiratory effort.  Speaking in full sentences. Cardiovascular system: Regular rate rhythm no murmurs rubs or gallops.  No JVD.  No lower extremity edema.  Gastrointestinal system: Abdomen is nondistended, soft and nontender. No organomegaly or masses felt. Normal bowel sounds heard.  Cholecystostomy tube noted right upper quadrant with drain in place.  Right hepatic drain also noted in place. Central nervous system: Patient alert today to self and place.  Moving extremities spontaneously.   Extremities: No clubbing cyanosis or edema. Skin: No rashes, lesions or ulcers Psychiatry: Judgement and insight appear fair.  Mood and affect flat    Data Reviewed:   CBC: Recent Labs  Lab 08/21/21 0317 08/22/21 0425 09-03-21 0456 08/24/21 0342 08/25/21 0159  WBC 8.4 7.3 4.9 8.0 9.3  NEUTROABS  --  4.7 3.4 6.4  --   HGB 10.7* 10.3* 9.2* 9.8* 10.4*  HCT 32.6* 31.6* 28.5* 29.7* 31.9*  MCV 96.7 97.2 98.3 96.4 97.0  PLT 160 146* 103* 105* 98*    Basic Metabolic Panel: Recent Labs  Lab 08/21/21 0317 08/22/21 0425 Sep 03, 2021 0456 08/24/21 0342 08/25/21 0159  NA 134* 132* 134* 134* 132*  K 3.9 3.9 3.5 3.4* 3.9  CL 108 106 111 107 105  CO2 19* 19* 18* 22 23  GLUCOSE 116* 91 93 129* 154*  BUN 35* 38* 39* 40* 40*  CREATININE 1.13* 1.32* 1.48* 1.55* 1.65*  CALCIUM 8.1* 8.2* 8.6* 8.1* 7.9*  MG  --  2.1  --   --  2.0  PHOS  --  4.6  --  3.1 3.1    GFR: Estimated  Creatinine Clearance: 26.9 mL/min (A) (by C-G formula based on SCr of 1.65 mg/dL (H)).  Liver Function Tests: Recent Labs  Lab 08/21/21 0317 08/22/21 0425 09-03-2021 0456 08/24/21 0342 08/25/21 0159  AST 34 36 205*  --  46*  ALT 13 14 35  --  23  ALKPHOS 340* 340* 637*  --  593*  BILITOT 0.6 0.6 1.6*  --  0.7  PROT 5.3* 5.2* 5.3*  --  5.2*  ALBUMIN <1.5* <1.5* 2.6* 2.1* 1.9*    CBG: No results for input(s): "GLUCAP" in the last 168 hours.   No results found for this or any previous visit (from the past 240 hour(s)).       Radiology Studies: EEG adult  Result Date: 09-03-21 Lora Havens, MD     September 03, 2021  8:07 PM Patient Name: MIKEL PYON MRN: [id number] Epilepsy Attending: Lora Havens Referring Physician/Provider: Eugenie Filler, MD Date: 2021/09/03 Duration: 28.24 mins Patient history: 76yo F with ams and continuous jerking movements of both arms. EEG to evaluate  for seizure. Level of alertness: lethargic AEDs during EEG study: None Technical aspects: This EEG study was done with scalp electrodes positioned according to the 10-20 International system of electrode placement. Electrical activity was acquired at a sampling rate of '500Hz'$  and reviewed with a high frequency filter of '70Hz'$  and a low frequency filter of '1Hz'$ . EEG data were recorded continuously and digitally stored. Description: No clear posterior dominant rhythm was seen. EEG showed continuous generalized polymorphic 3-'5Hz'$  theta-delta slowing. Generalized periodic discharges with triphasic morphology at 1-1.'5Hz'$  were also noted, more prominent when awake/stimulated. Hyperventilation and photic stimulation were not performed.    ABNORMALITY - Periodic discharges with triphasic morphology, generalized ( GPDs) - Continuous slow, generalized  IMPRESSION: This study showed generalized periodic discharges with triphasic morphology. This EEG pattern is on the ictal-interictal continuum. However the morphology,  frequency and reactivity to stimulation is more commonly indicative of toxic-metabolic causes like hyperammonemia, cefepime toxicity. Additionally there is moderate to severe diffuse encephalopathy. No seizures were seen throughout the recording.  If patient remains altered, can consider prolonged EEG monitoring.  Priyanka Barbra Sarks         Scheduled Meds:  amLODipine  5 mg Oral Daily   apixaban  5 mg Oral BID   bisacodyl  10 mg Rectal Once   dronabinol  5 mg Oral BID AC   levothyroxine  125 mcg Oral QAC breakfast   multivitamin with minerals  1 tablet Oral Daily   nystatin  5 mL Oral QID   pantoprazole (PROTONIX) IV  40 mg Intravenous Q24H   polyethylene glycol  17 g Oral Daily   sodium chloride flush  5 mL Intracatheter Daily   sodium chloride flush  5 mL Intracatheter Daily   [START ON 09/01/2021] thiamine injection  100 mg Intravenous Daily   Continuous Infusions:  ciprofloxacin 400 mg (08/25/21 1003)   metronidazole 500 mg (08/25/21 1653)   thiamine injection 500 mg (08/25/21 1430)   Followed by   Derrill Memo ON 08/26/2021] thiamine injection       LOS: 5 days    Time spent: 40 minutes    Irine Seal, MD [facility]   To contact the attending provider between 7A-7P or the covering provider during after hours 7P-7A, please log into the web site [email or web address] and access using universal [place] password for that web site. If you do not have the password, please call the hospital operator.  08/25/2021, 7:12 PM

## 2021-08-25 NOTE — Progress Notes (Signed)
Pt reports that zofran gives her diarrhea and would like an order for a different nausea medication if possible. Provider paged to see if new order will be appropriate.

## 2021-08-25 NOTE — Progress Notes (Signed)
Pt request xanax for anxiety at this time. Informed pt that for the last few days her daily as needed dose has been given at night and that if I give her dose now this would count as her daily dose and that night shift would not be able to give her an additional dose. Pt agreed and her sister was present at bedside.

## 2021-08-25 NOTE — Progress Notes (Signed)
Per pt and her family today pt has had 3/4 of a vanilla milkshake, a tomato sandwich and some watermelon.

## 2021-08-25 NOTE — Plan of Care (Signed)
Floor coverage note:  Called by nurse for "Pt reports that zofran gives her diarrhea and would like an order for a different nausea medication if possible. Provider paged to see if new order will be appropriate".  A/P; Intolerance to Zofran: --DC Zofran --Compazine '5mg'$  IV q4h/prn

## 2021-08-26 ENCOUNTER — Inpatient Hospital Stay (HOSPITAL_COMMUNITY): Payer: Medicare HMO

## 2021-08-26 ENCOUNTER — Other Ambulatory Visit: Payer: Self-pay | Admitting: Oncology

## 2021-08-26 DIAGNOSIS — K819 Cholecystitis, unspecified: Secondary | ICD-10-CM | POA: Diagnosis not present

## 2021-08-26 DIAGNOSIS — E039 Hypothyroidism, unspecified: Secondary | ICD-10-CM | POA: Diagnosis not present

## 2021-08-26 DIAGNOSIS — E860 Dehydration: Secondary | ICD-10-CM | POA: Diagnosis not present

## 2021-08-26 DIAGNOSIS — N179 Acute kidney failure, unspecified: Secondary | ICD-10-CM | POA: Diagnosis not present

## 2021-08-26 LAB — CBC
HCT: 32.7 % — ABNORMAL LOW (ref 36.0–46.0)
Hemoglobin: 10.5 g/dL — ABNORMAL LOW (ref 12.0–15.0)
MCH: 31.3 pg (ref 26.0–34.0)
MCHC: 32.1 g/dL (ref 30.0–36.0)
MCV: 97.6 fL (ref 80.0–100.0)
Platelets: 109 10*3/uL — ABNORMAL LOW (ref 150–400)
RBC: 3.35 MIL/uL — ABNORMAL LOW (ref 3.87–5.11)
RDW: 19 % — ABNORMAL HIGH (ref 11.5–15.5)
WBC: 7.9 10*3/uL (ref 4.0–10.5)
nRBC: 0 % (ref 0.0–0.2)

## 2021-08-26 LAB — BASIC METABOLIC PANEL
Anion gap: 8 (ref 5–15)
BUN: 42 mg/dL — ABNORMAL HIGH (ref 8–23)
CO2: 22 mmol/L (ref 22–32)
Calcium: 7.8 mg/dL — ABNORMAL LOW (ref 8.9–10.3)
Chloride: 105 mmol/L (ref 98–111)
Creatinine, Ser: 1.72 mg/dL — ABNORMAL HIGH (ref 0.44–1.00)
GFR, Estimated: 30 mL/min — ABNORMAL LOW (ref >60)
Glucose, Bld: 170 mg/dL — ABNORMAL HIGH (ref 70–99)
Potassium: 3.9 mmol/L (ref 3.5–5.1)
Sodium: 135 mmol/L (ref 135–145)

## 2021-08-26 MED ORDER — TAMSULOSIN HCL 0.4 MG PO CAPS
0.4000 mg | ORAL_CAPSULE | Freq: Every day | ORAL | Status: DC
  Administered 2021-08-26 – 2021-08-27 (×2): 0.4 mg via ORAL
  Filled 2021-08-26 (×2): qty 1

## 2021-08-26 MED ORDER — ALBUMIN HUMAN 25 % IV SOLN
25.0000 g | Freq: Two times a day (BID) | INTRAVENOUS | Status: DC
  Administered 2021-08-26 – 2021-08-30 (×8): 25 g via INTRAVENOUS
  Filled 2021-08-26 (×8): qty 100

## 2021-08-26 NOTE — Evaluation (Signed)
Physical Therapy Evaluation Patient Details Name: [name] MRN: [id number] DOB: 23-Oct-1945 Today's Date: 08/26/2021  History of Present Illness  Pt is a 76 y.o. female who presented 08/20/21 for dehydration with hyponatremia, cholecystitis, AKI, and weakness with poor oral intake. PMH includes: 06/30/21 COVID-19 infection diagnosed 06/20/21 with COVID PNA, T2DM, HLD, HTN, hx of TIA, depression/anxiety, metastatic pancreatic cancer currently undergoing chemotherapy, s/p cholecystostomy by IR 5/5   Clinical Impression  Pt in bed upon arrival of PT, agreeable to evaluation at this time. Prior to admission the pt reports she was independent with mobility, and states she is independent with ADLs other than some dressing (buttons) which her spouse assists with. The pt now presents with limitations in functional mobility, strength, ROM, UE coordination, and activity tolerance due to above dx, and will continue to benefit from skilled PT to address these deficits. The pt was agreeable to session, but remains very pain limited. The pt was agreeable to attempt transition to sitting EOB, but was unable to tolerate more than x2 movements of LE towards the EOB, and then requested to be returned to supine. Will continue to benefit from skilled PT to progress OOB mobility and activity tolerance as pt's goal is to return home with family assist. If going home at this time would need hospital bed, lift, and wheelchair.         Recommendations for follow up therapy are one component of a multi-disciplinary discharge planning process, led by the attending physician.  Recommendations may be updated based on patient status, additional functional criteria and insurance authorization.  Follow Up Recommendations Skilled nursing-short term rehab (<3 hours/day)    Assistance Recommended at Discharge Frequent or constant Supervision/Assistance  Patient can return home with the following  Two people to help with walking  and/or transfers;Two people to help with bathing/dressing/bathroom;Assistance with feeding;Assistance with cooking/housework;Direct supervision/assist for medications management;Direct supervision/assist for financial management;Assist for transportation;Help with stairs or ramp for entrance    Equipment Recommendations Wheelchair (measurements PT);Wheelchair cushion (measurements PT);Hospital bed (hoyer lift)  Recommendations for Other Services       Functional Status Assessment Patient has had a recent decline in their functional status and demonstrates the ability to make significant improvements in function in a reasonable and predictable amount of time.     Precautions / Restrictions Precautions Precautions: Fall Precaution Comments: R flank drain Restrictions Weight Bearing Restrictions: No Other Position/Activity Restrictions: pt reports significant cramps in LLE causing pain      Mobility  Bed Mobility Overal bed mobility: Needs Assistance             General bed mobility comments: attempted to initiate movement towards EOB with LLE supported by pillow, after x2 movement attempts, pt reports cramping pain in LLE and L side, unable to reach EOB at this time. pt unable to tolerate rolling or repositioning in bed without significant pain         Pertinent Vitals/Pain Pain Assessment Pain Assessment: Faces Faces Pain Scale: Hurts whole lot Pain Location: L calf cramping with any touch Pain Descriptors / Indicators: Cramping Pain Intervention(s): Limited activity within patient's tolerance, Monitored during session, Repositioned    Home Living Family/patient expects to be discharged to:: Private residence Living Arrangements: Spouse/significant other Available Help at Discharge: Family;Available 24 hours/day Type of Home: House Home Access: Stairs to enter Entrance Stairs-Rails: Right Entrance Stairs-Number of Steps: 2-3   Home Layout: One level Home Equipment:  Conservation officer, nature (2 wheels);Cane - single point;Grab bars - tub/shower;BSC/3in1;Shower  seat;Wheelchair - manual      Prior Function Prior Level of Function : Needs assist         Mobility (physical): Gait   Mobility Comments: pt reports not using DME, previous charting reports pt using RW. pt states no other falls ADLs Comments: pt states she is able to sponge bathe independently, dresses independently other than buttons.husband completes IADLs, pt manages meds and finances     Hand Dominance   Dominant Hand: Right    Extremity/Trunk Assessment   Upper Extremity Assessment Upper Extremity Assessment: Generalized weakness    Lower Extremity Assessment Lower Extremity Assessment: Generalized weakness;LLE deficits/detail LLE Deficits / Details: limited by cramping with any ROM or touch to LLE. pt able to demo slight movement at toes LLE: Unable to fully assess due to pain    Cervical / Trunk Assessment Cervical / Trunk Assessment: Kyphotic  Communication   Communication: No difficulties  Cognition Arousal/Alertness: Awake/alert Behavior During Therapy: Flat affect Overall Cognitive Status: Within Functional Limits for tasks assessed                                 General Comments: pt requiring max cues for mobility and importance        General Comments General comments (skin integrity, edema, etc.): pt very pain-limited at this time        Assessment/Plan    PT Assessment Patient needs continued PT services  PT Problem List Decreased strength;Decreased range of motion;Decreased activity tolerance;Decreased balance;Decreased mobility;Decreased coordination;Decreased cognition;Decreased knowledge of use of DME;Decreased safety awareness       PT Treatment Interventions DME instruction;Gait training;Functional mobility training;Stair training;Therapeutic activities;Therapeutic exercise;Balance training;Neuromuscular re-education;Patient/family education     PT Goals (Current goals can be found in the Care Plan section)  Acute Rehab PT Goals Patient Stated Goal: to walk PT Goal Formulation: With patient/family Time For Goal Achievement: 09/09/21 Potential to Achieve Goals: Poor    Frequency Min 2X/week        AM-PAC PT "6 Clicks" Mobility  Outcome Measure Help needed turning from your back to your side while in a flat bed without using bedrails?: Total Help needed moving from lying on your back to sitting on the side of a flat bed without using bedrails?: Total Help needed moving to and from a bed to a chair (including a wheelchair)?: Total Help needed standing up from a chair using your arms (e.g., wheelchair or bedside chair)?: Total Help needed to walk in hospital room?: Total Help needed climbing 3-5 steps with a railing? : Total 6 Click Score: 6    End of Session   Activity Tolerance: Patient limited by pain Patient left: in bed;with call bell/phone within reach;with bed alarm set Nurse Communication: Mobility status PT Visit Diagnosis: Unsteadiness on feet (R26.81);Muscle weakness (generalized) (M62.81);Difficulty in walking, not elsewhere classified (R26.2)    Time: 9518-8416 PT Time Calculation (min) (ACUTE ONLY): 49 min   Charges:   PT Evaluation $PT Eval Low Complexity: 1 Low PT Treatments $Therapeutic Exercise: 8-22 mins $Therapeutic Activity: 8-22 mins        West Carbo, PT, DPT   Acute Rehabilitation Department  Sandra Cockayne 08/26/2021, 6:54 PM

## 2021-08-26 NOTE — Progress Notes (Addendum)
PROGRESS NOTE    [name]  NLZ:[id number] DOB: [birth year] DOA: 08/20/2021 PCP: [name], MD    No chief complaint on file.   Brief Narrative:  [name] is a 76 y.o. female with medical history significant for acute cholecystitis status post cholecystostomy, history of pancreatic cancer, hypothyroidism, hypertension, generalized anxiety disorder, history of venous thromboembolic disease, recent hospitalization for sepsis with Morganella morganii/E. coli bacteremia who was sent to the hospital as a direct admission for evaluation of poor oral intake and severe dehydration. Patient's daughter states that since her discharge her oral intake has been very poor but over the last several days she has not had anything to eat or drink due to persistent nausea and vomiting despite taking antiemetics as well as poor appetite.  She complains of feeling very weak and tired and has been unable to get out of bed for the last 2 days.  She feels her legs are very heavy and unable to bear her weight.  She denies having any falls. She complains of abdominal pain mostly in the left lumbar area which she rates a 5 x 10 in intensity at its worst.  But denies having any changes in her bowel habits.  She denies having any fever, no chills, no urinary symptoms, no dizziness, no lightheadedness, no chest pain, no shortness of breath, no headache, no blurred vision or focal deficit. Her daughter is concerned about her progressive decline and inability to participate in physical therapy due to weakness.    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   -Encourage oral intake.   -Follow.  Cholecystitis Status post cholecystostomy tube insertion In addition, had perihepatic drain placed Saw IR on 5/31 and plan for capping trial with return for imaging in 2 weeks Her symptoms seem to have worsened after this trial, unclear if this is related? CT 08/16/21 with stable CT, small bilateral effusions, numerous mets within liver, drainage catheter within right perihepatic fluid collection and in the gallbladder, pancreatic body mass c/w known pancreatic malignancy, large stool burden IR consulted to see about reattaching bags. With E. coli and Morganella morganii bacteremia Was on antibiotic therapy with cefepime and Flagyl. Patient with acute metabolic encephalopathy on 08/23/2021 and noted to be pocketing pills and also refusing some of her medications and as such oral Flagyl changed to IV Flagyl.  -Also due to concerns that cefepime may be contributing to metabolic encephalopathy cefepime changed to IV ciprofloxacin per ID.   ID following and appreciate input and recommendations.  Acute metabolic encephalopathy - Patient noted to be drowsy, confusion, noted to have some myoclonic jerking on 08/23/2021.   -Likely multifactorial secondary to medications including narcotics, benzos in the setting of AKI. -Patient noted to have received Xanax and Remeron at the same time the evening of  08/22/2021, as well as oxycodone 1 to 2 hours prior to that. -Head CT done unremarkable.   -EEG done was negative for seizures.  -Ammonia level within normal limits. -Discontinued Remeron and decreased  oxycodone to 1 tablet every 6 hours as needed. -Vitamin B1 level pending. -Cefepime discontinued. -Patient started on high-dose IV thiamine per neurology recommendations. -Patient with clinical improvement, alert, following commands appropriately. -Neurology consulted and following.  AKI (acute kidney injury) ([place]) -IV fluids held due to mild swelling noted.   -Swelling likely secondary to severe hypoalbuminemia.   Right hand with ring stuck on ring finger, Dr. Florene Glen discussed with RN to help with removal. -Ring has been removed. Renal US done early on during the hospitalization-> without hydro -renal function was initially improving however trending back up likely secondary to poor oral intake.  -Urinalysis ordered however patient refused and not done. -Renal function trending up. -Urine sodium noted at 14. -Check a bladder scan. -Check a renal ultrasound -I and O cath x1. -Continue IV fluids. -We will give some doses of IV albumin. -Follow. -Avoid nephrotoxins. -Supportive care.  Weakness Secondary to poor oral intake as well as multiple comorbid conditions. Possibly related to capping trial? Follow symptoms after this. -Gentle hydration. PT/OT evals  Diet-controlled diabetes mellitus ([place]) 6.3 a1c 06/2021 -Blood glucose level of 119 this morning on lab work.   -Bicarb drip has been discontinued -IV fluids. -Supportive care.    Essential hypertension -Controlled on current regimen of amlodipine.   -Patient with altered mental status drowsy with poor oral intake and pocketing medications. -Mental status improved.  -Amlodipine resumed.   -Follow.  Acquired hypothyroidism Stable. -Synthroid. Outpatient follow-up.  Decubitus skin ulcer     Oral thrush - Patient with some concerns for oral thrush. -Continue nystatin swish and swallow.  Protein-calorie malnutrition, severe ([place]) - Patient with severe protein calorie malnutrition with albumin level <  1.5. -Likely secondary to metastatic pancreatic cancer in the setting of poor oral intake secondary to nausea. -Placed on scheduled Zofran 3 times daily x 24 hours. -Status post IV albumin every 6 hours x24 hours. -Encourage oral intake. -Due to acute metabolic encephalopathy with drowsiness and lethargy with some myoclonic jerks Remeron discontinued.  -Patient seen by speech therapist and patient placed on a regular diet with thin liquids.  -Marinol ordered for appetite stimulant per palliative care however noted to be refusing however seem to have taken it today.  -Encourage p.o. intake.   -Supportive care.  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 Benay Spice during this hospitalization.         DVT prophylaxis: Eliquis Code Status: DNR Family Communication: Updated husband at bedside.  Updated daughter on telephone. Disposition: Likely home with home health therapies  Status is: Inpatient Remains inpatient appropriate because: Severity of illness   Consultants:  Interventional radiology Infectious disease: Dr. Candiss Norse 08/21/2021 Palliative care: Dr. Rowe Pavy 08/21/2021 Oncology informed via epic of admission. Neurology: Dr. Leonel Ramsay 08/23/2021  Procedures:  CT abdomen and pelvis 08/21/2021 Chest x-ray 08/20/2021 Renal ultrasound 08/21/2021 CT head 08/23/2021 EEG 08/23/2021 CT head 08/23/2021    Antimicrobials:  Oral Flagyl 08/20/2021>>>> IV Flagyl 08/23/2021>>>> IV cefepime 08/21/2021>>>> 08/23/2021 IV ciprofloxacin 08/23/2021>>>    Subjective: Patient alert, sitting up in bed.  Stated he ate some  food today.  Denies any chest pain.  No shortness of breath.  No abdominal pain.  Husband at bedside.  Niece  and best friend at bedside.  .  Objective: Vitals:   08/25/21 1303 08/25/21 2023 08/26/21 0617 08/26/21 0926  BP: 130/83 137/75 (!) 144/92 (!) 139/93  Pulse: 99 100 (!) 110 (!) 106  Resp: '16 17 17 16  '$ Temp: 98.4 F (36.9 C) 98.1 F (36.7 C) 98 F (36.7 C) (!) 97.3 F (36.3 C)  TempSrc: Oral Oral Oral Oral  SpO2: 97% 98% 97% 97%  Weight:      Height:        Intake/Output Summary (Last 24 hours) at 08/26/2021 1746 Last data filed at 08/26/2021 1427 Gross per 24 hour  Intake 1250.51 ml  Output 900 ml  Net 350.51 ml   Filed Weights   08/20/21 2200  Weight: 72.8 kg    Examination:  General exam: Alert.  Dry mucous membranes.  Oral thrush.  Respiratory system: Clear to auscultation bilaterally.  No wheezes, no crackles, no rhonchi.  Normal respiratory effort.  Speaking in full sentences.   Cardiovascular system: RRR no murmurs rubs or gallops.  No JVD.  No lower extremity edema.  Gastrointestinal system: Abdomen is nondistended, soft and nontender. No organomegaly or masses felt. Normal bowel sounds heard.  Cholecystostomy tube noted right upper quadrant with drain in place.  Right hepatic drain also noted in place. Central nervous system: Patient alert today to self and place.  Moving extremities spontaneously.   Extremities: No clubbing cyanosis or edema. Skin: No rashes, lesions or ulcers Psychiatry: Judgement and insight appear fair.  Mood and affect flat    Data Reviewed:   CBC: Recent Labs  Lab 08/22/21 0425 08/23/21 0456 08/24/21 0342 08/25/21 0159 08/26/21 0049  WBC 7.3 4.9 8.0 9.3 7.9  NEUTROABS 4.7 3.4 6.4  --   --   HGB 10.3* 9.2* 9.8* 10.4* 10.5*  HCT 31.6* 28.5* 29.7* 31.9* 32.7*  MCV 97.2 98.3 96.4 97.0 97.6  PLT 146* 103* 105* 98* 109*    Basic Metabolic Panel: Recent Labs  Lab 08/22/21 0425 08/23/21 0456 08/24/21 0342 08/25/21 0159 08/26/21 0049  NA 132* 134* 134* 132* 135  K 3.9 3.5 3.4* 3.9 3.9  CL 106 111 107 105 105  CO2 19* 18*  '22 23 22  '$ GLUCOSE 91 93 129* 154* 170*  BUN 38* 39* 40* 40* 42*  CREATININE 1.32* 1.48* 1.55* 1.65* 1.72*  CALCIUM 8.2* 8.6* 8.1* 7.9* 7.8*  MG 2.1  --   --  2.0  --   PHOS 4.6  --  3.1 3.1  --     GFR: Estimated Creatinine Clearance: 25.8 mL/min (A) (by C-G formula based on SCr of 1.72 mg/dL (H)).  Liver Function Tests: Recent Labs  Lab 08/21/21 0317 08/22/21 0425 08/23/21 0456 08/24/21 0342 08/25/21 0159  AST 34 36 205*  --  46*  ALT 13 14 35  --  23  ALKPHOS 340* 340* 637*  --  593*  BILITOT 0.6 0.6 1.6*  --  0.7  PROT 5.3* 5.2* 5.3*  --  5.2*  ALBUMIN <1.5* <1.5* 2.6* 2.1* 1.9*    CBG: No results for input(s): "GLUCAP" in the last 168 hours.   Recent Results (from the past 240 hour(s))  Urine Culture     Status: Abnormal (Preliminary result)   Collection Time: 08/23/21  8:22 AM   Specimen: Urine, Catheterized  Result Value Ref Range Status   Specimen  Description URINE, CATHETERIZED  Final   Special Requests NONE  Final   Culture (A)  Final    >=100,000 COLONIES/mL ENTEROCOCCUS FAECIUM CULTURE REINCUBATED FOR BETTER GROWTH Performed at [facility], [address], [place], [place] [address]    Report Status PENDING  Incomplete         Radiology Studies: US RENAL  Result Date: 08/26/2021 CLINICAL DATA:  Renal dysfunction EXAM: RENAL / URINARY TRACT ULTRASOUND COMPLETE COMPARISON:  08/21/2021 FINDINGS: Right Kidney: Renal measurements: 10.6 x 5.1 x 5.9 cm = volume: 168 mL. There is no hydronephrosis. There is increased cortical echogenicity. There is 3.5 cm cyst in the upper pole. There is 1.6 cm cyst in the midportion. Left Kidney: Renal measurements: 9.4 x 4.7 x 5.4 cm = volume: 124 mL. There is no hydronephrosis. There is increased cortical echogenicity. Bladder: Appears normal for degree of bladder distention. Other: Small ascites is seen. Minimal amount of perinephric fluid collection may be part of ascites. Spleen appears to be enlarged in size.  There is possible right pleural effusion. IMPRESSION: There is no hydronephrosis. There is increased cortical echogenicity in both kidneys suggesting medical renal disease. Right renal cysts. Ascites.  Right pleural effusion. Electronically Signed   By: Elmer Picker M.D.   On: 08/26/2021 12:01        Scheduled Meds:  amLODipine  5 mg Oral Daily   apixaban  5 mg Oral BID   bisacodyl  10 mg Rectal Once   dronabinol  5 mg Oral BID AC   levothyroxine  125 mcg Oral QAC breakfast   multivitamin with minerals  1 tablet Oral Daily   nystatin  5 mL Oral QID   pantoprazole (PROTONIX) IV  40 mg Intravenous Q24H   polyethylene glycol  17 g Oral Daily   sodium chloride flush  5 mL Intracatheter Daily   sodium chloride flush  5 mL Intracatheter Daily   [START ON 09/01/2021] thiamine injection  100 mg Intravenous Daily   Continuous Infusions:  sodium chloride 125 mL/hr at 08/26/21 1031   albumin human     ciprofloxacin 400 mg (08/26/21 1031)   metronidazole 500 mg (08/26/21 1738)   thiamine injection 250 mg (08/26/21 1232)     LOS: 6 days    Time spent: 40 minutes    Irine Seal, MD [facility]   To contact the attending provider between 7A-7P or the covering provider during after hours 7P-7A, please log into the web site [email or web address] and access using universal [place] password for that web site. If you do not have the password, please call the hospital operator.  08/26/2021, 5:46 PM

## 2021-08-26 NOTE — Progress Notes (Signed)
Bladder scan 1250, Straight  cath and removed 965m of amber urine.

## 2021-08-27 ENCOUNTER — Inpatient Hospital Stay: Payer: Medicare HMO

## 2021-08-27 DIAGNOSIS — E039 Hypothyroidism, unspecified: Secondary | ICD-10-CM | POA: Diagnosis not present

## 2021-08-27 DIAGNOSIS — K819 Cholecystitis, unspecified: Secondary | ICD-10-CM | POA: Diagnosis not present

## 2021-08-27 DIAGNOSIS — E860 Dehydration: Secondary | ICD-10-CM | POA: Diagnosis not present

## 2021-08-27 DIAGNOSIS — N179 Acute kidney failure, unspecified: Secondary | ICD-10-CM | POA: Diagnosis not present

## 2021-08-27 DIAGNOSIS — R338 Other retention of urine: Secondary | ICD-10-CM | POA: Clinically undetermined

## 2021-08-27 DIAGNOSIS — E871 Hypo-osmolality and hyponatremia: Secondary | ICD-10-CM | POA: Diagnosis not present

## 2021-08-27 LAB — CBC WITH DIFFERENTIAL/PLATELET
Abs Immature Granulocytes: 0.04 10*3/uL (ref 0.00–0.07)
Basophils Absolute: 0 10*3/uL (ref 0.0–0.1)
Basophils Relative: 0 %
Eosinophils Absolute: 0 10*3/uL (ref 0.0–0.5)
Eosinophils Relative: 0 %
HCT: 29.3 % — ABNORMAL LOW (ref 36.0–46.0)
Hemoglobin: 9.6 g/dL — ABNORMAL LOW (ref 12.0–15.0)
Immature Granulocytes: 1 %
Lymphocytes Relative: 17 %
Lymphs Abs: 1.2 10*3/uL (ref 0.7–4.0)
MCH: 31.7 pg (ref 26.0–34.0)
MCHC: 32.8 g/dL (ref 30.0–36.0)
MCV: 96.7 fL (ref 80.0–100.0)
Monocytes Absolute: 0.6 10*3/uL (ref 0.1–1.0)
Monocytes Relative: 8 %
Neutro Abs: 5.2 10*3/uL (ref 1.7–7.7)
Neutrophils Relative %: 74 %
Platelets: 98 10*3/uL — ABNORMAL LOW (ref 150–400)
RBC: 3.03 MIL/uL — ABNORMAL LOW (ref 3.87–5.11)
RDW: 18.9 % — ABNORMAL HIGH (ref 11.5–15.5)
WBC: 7 10*3/uL (ref 4.0–10.5)
nRBC: 0 % (ref 0.0–0.2)

## 2021-08-27 LAB — RENAL FUNCTION PANEL
Albumin: 2 g/dL — ABNORMAL LOW (ref 3.5–5.0)
Anion gap: 7 (ref 5–15)
BUN: 40 mg/dL — ABNORMAL HIGH (ref 8–23)
CO2: 21 mmol/L — ABNORMAL LOW (ref 22–32)
Calcium: 7.7 mg/dL — ABNORMAL LOW (ref 8.9–10.3)
Chloride: 107 mmol/L (ref 98–111)
Creatinine, Ser: 1.71 mg/dL — ABNORMAL HIGH (ref 0.44–1.00)
GFR, Estimated: 30 mL/min — ABNORMAL LOW (ref >60)
Glucose, Bld: 151 mg/dL — ABNORMAL HIGH (ref 70–99)
Phosphorus: 3.6 mg/dL (ref 2.5–4.6)
Potassium: 4.3 mmol/L (ref 3.5–5.1)
Sodium: 135 mmol/L (ref 135–145)

## 2021-08-27 LAB — MAGNESIUM: Magnesium: 2 mg/dL (ref 1.7–2.4)

## 2021-08-27 MED ORDER — CIPROFLOXACIN HCL 500 MG PO TABS
500.0000 mg | ORAL_TABLET | Freq: Every day | ORAL | Status: DC
Start: 2021-08-28 — End: 2021-08-30
  Administered 2021-08-28 – 2021-08-29 (×2): 500 mg via ORAL
  Filled 2021-08-27 (×2): qty 1

## 2021-08-27 MED ORDER — PANTOPRAZOLE SODIUM 40 MG PO TBEC
40.0000 mg | DELAYED_RELEASE_TABLET | Freq: Every day | ORAL | Status: DC
  Administered 2021-08-28 – 2021-08-31 (×4): 40 mg via ORAL
  Filled 2021-08-27 (×4): qty 1

## 2021-08-27 MED ORDER — METRONIDAZOLE 500 MG PO TABS
500.0000 mg | ORAL_TABLET | Freq: Two times a day (BID) | ORAL | Status: AC
Start: 2021-08-27 — End: 2021-08-30
  Administered 2021-08-27 – 2021-08-30 (×7): 500 mg via ORAL
  Filled 2021-08-27 (×7): qty 1

## 2021-08-27 MED ORDER — SODIUM CHLORIDE 0.9 % IV SOLN: INTRAVENOUS | Status: DC

## 2021-08-27 NOTE — Progress Notes (Signed)
PROGRESS NOTE    [name]  XAJ:[id number] DOB: September 17, 1945 DOA: 08/20/2021 PCP: [name], MD    No chief complaint on file.   Brief Narrative:  [name] is a 76 y.o. female with medical history significant for acute cholecystitis status post cholecystostomy, history of pancreatic cancer, hypothyroidism, hypertension, generalized anxiety disorder, history of venous thromboembolic disease, recent hospitalization for sepsis with Morganella morganii/E. coli bacteremia who was sent to the hospital as a direct admission for evaluation of poor oral intake and severe dehydration. Patient's daughter states that since her discharge her oral intake has been very poor but over the last several days she has not had anything to eat or drink due to persistent nausea and vomiting despite taking antiemetics as well as poor appetite.  She complains of feeling very weak and tired and has been unable to get out of bed for the last 2 days.  She feels her legs are very heavy and unable to bear her weight.  She denies having any falls. She complains of abdominal pain mostly in the left lumbar area which she rates a 5 x 10 in intensity at its worst.  But denies having any changes in her bowel habits.  She denies having any fever, no chills, no urinary symptoms, no dizziness, no lightheadedness, no chest pain, no shortness of breath, no headache, no blurred vision or focal deficit. Her daughter is concerned about her progressive decline and inability to participate in physical therapy due to weakness.    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   -Encourage oral intake.   -Patient started to develop some volume overload with some lower extremity edema likely secondary to third spacing. -Saline lock IV fluids. -Follow.  Cholecystitis Status post cholecystostomy tube insertion In addition, had perihepatic drain placed Saw IR on 5/31 and plan for capping trial with return for imaging in 2 weeks Her symptoms seem to have worsened after this trial, unclear if this is related? CT 08/16/21 with stable CT, small bilateral effusions, numerous mets within liver, drainage catheter within right perihepatic fluid collection and in the gallbladder, pancreatic body mass c/w known pancreatic malignancy, large stool burden IR consulted to see about reattaching bags. With E. coli and Morganella morganii bacteremia Was on antibiotic therapy with cefepime and Flagyl. Patient with acute metabolic encephalopathy on 08/23/2021 and noted to be pocketing pills and also refusing some of her medications and as such oral Flagyl changed to IV Flagyl.  -Also due to concerns that cefepime may be contributing to metabolic encephalopathy cefepime changed to IV ciprofloxacin per ID.   ID following and appreciate input and recommendations.  Acute metabolic encephalopathy - Patient noted to be drowsy, confusion, noted to have some myoclonic jerking on 08/23/2021.   -Likely multifactorial  secondary to medications including narcotics, benzos in the setting of AKI. -Patient noted to have received Xanax and Remeron at the same time the evening of 08/22/2021, as well as oxycodone 1 to 2  hours prior to that. -Head CT done unremarkable.   -EEG done was negative for seizures.  -Ammonia level within normal limits. -Discontinued Remeron and decreased oxycodone to 1 tablet every 6 hours as needed. -Vitamin B1 level pending. -Cefepime discontinued. -Patient started on high-dose IV thiamine per neurology recommendations. -Patient with clinical improvement, alert, following commands appropriately. -Neurology consulted, was following but have signed off. -Supportive care.  AKI (acute kidney injury) ([place]) -IV fluids held due to mild swelling noted.   -Swelling likely secondary to severe hypoalbuminemia.   Right hand with ring stuck on ring finger, Dr. Florene Glen discussed with RN to help with removal. -Ring has been removed. Renal US done early on during the hospitalization-> without hydro -renal function was initially improving however trending back up likely secondary to poor oral intake.  -Urinalysis ordered however patient refused and not done. -Renal function trending up. -Urine sodium noted at 14. -Bladder scan done with about 1200 cc of urine noted, INO cath none on 08/26/2021 with 900 cc of urine noted.   -Every 6 bladder scan, I and o every 6 hours as needed.  -Renal ultrasound done negative for hydronephrosis.   -Patient starting to become volume overloaded and as such we will saline lock IV fluids.   -Patient placed on IV albumin twice daily.   -Renal function seems to be plateauing.   -Avoid nephrotoxins.   -Follow.  Weakness Secondary to poor oral intake as well as multiple comorbid conditions. Possibly related to capping trial? Follow symptoms after this. -Gentle hydration. PT/OT evals  Diet-controlled diabetes mellitus ([place]) 6.3 a1c 06/2021 -Blood glucose level of 151 this morning on lab work.   -Bicarb drip has been discontinued -Saline lock IV fluids. -Supportive care.    Essential hypertension -Controlled on current regimen of amlodipine.   -Patient  with altered mental status drowsy with poor oral intake and pocketing medications early on in the hospitalization which has since improved.. -Mental status improved.  -Amlodipine resumed.   -Follow.  Acquired hypothyroidism Stable. -Synthroid. Outpatient follow-up.  Decubitus skin ulcer     Acute urinary retention - Bladder scan done 08/26/2021 with 1200 cc of urine noted per RN. -I and O cath done on 08/26/2021 with 900 cc of urine output. -Serial bladder scans, I and O cath every 6 hours as needed.  Oral thrush - Patient with some concerns for oral thrush. -Continue nystatin swish and swallow.  Protein-calorie malnutrition, severe ([place]) - Patient with severe protein calorie malnutrition with albumin level < 1.5. -Likely secondary to metastatic pancreatic cancer in the setting of poor oral intake secondary to nausea. -Placed on scheduled Zofran 3 times daily x 24 hours. -Status post IV albumin every 6 hours x24 hours. -Encourage oral intake. -Due to acute metabolic encephalopathy with drowsiness and lethargy with some myoclonic jerks Remeron discontinued.  -Patient seen by speech therapist and patient placed on a regular diet with thin liquids.  -Marinol ordered for appetite stimulant per palliative care however noted to be refusing early on in the hospitalization, however seems to be taking it. -IV albumin twice daily.  -Encourage oral intake.  -Supportive care.  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 Benay Spice during this hospitalization. -Hospice recommended per oncology if patient does not start to improve soon.         DVT prophylaxis: Eliquis Code Status: DNR Family  Communication: Updated husband at bedside.  Updated daughter on telephone. Disposition: Likely home with home health therapies  Status is: Inpatient Remains inpatient appropriate because: Severity of illness   Consultants:  Interventional radiology Infectious disease: Dr. Candiss Norse 08/21/2021 Palliative care: Dr. Rowe Pavy 08/21/2021 Oncology informed via epic of admission. Neurology: Dr. Leonel Ramsay 08/23/2021  Procedures:  CT abdomen and pelvis 08/21/2021 Chest x-ray 08/20/2021 Renal ultrasound 08/21/2021, 08/26/2021 CT head 08/23/2021 EEG 08/23/2021 CT head 08/23/2021    Antimicrobials:  Oral Flagyl 08/20/2021>>>> IV Flagyl 08/23/2021>>>> IV cefepime 08/21/2021>>>> 08/23/2021 IV ciprofloxacin 08/23/2021>>>    Subjective: Alert.  Family at bedside.  Per family was just starting to eat a sandwich.  No chest pain.  Denies any shortness of breath.   .  Objective: Vitals:   08/26/21 0926 08/26/21 2019 08/27/21 0531 08/27/21 0806  BP: (!) 139/93 131/83 129/83 133/90  Pulse: (!) 106 98 95 (!) 106  Resp: '16 17 17 17  '$ Temp: (!) 97.3 F (36.3 C) 98.1 F (36.7 C) 97.7 F (36.5 C) 97.9 F (36.6 C)  TempSrc: Oral Oral Oral   SpO2: 97% 98% 95% 95%  Weight:      Height:        Intake/Output Summary (Last 24 hours) at 08/27/2021 1820 Last data filed at 08/27/2021 1537 Gross per 24 hour  Intake 1541.34 ml  Output 900 ml  Net 641.34 ml   Filed Weights   08/20/21 2200  Weight: 72.8 kg    Examination:  General exam: Alert.  Dry mucous membranes.  Oral thrush noted. Respiratory system: Bibasilar crackles.  No wheezing.  No rhonchi.  Fair air movement.  Speaking in full sentences. Cardiovascular system: Regular rate rhythm no murmurs rubs or gallops.  No JVD.  1-2+ bilateral lower extremity edema.  Gastrointestinal system: Abdomen is nondistended, soft and nontender. No organomegaly or masses felt. Normal bowel sounds heard.  Cholecystostomy tube noted right upper quadrant with drain in place.  Right  hepatic drain also noted in place. Central nervous system: Patient alert today to self and place.  Moving extremities spontaneously.   Extremities: 1-2+ bilateral lower extremity edema.  Skin: No rashes, lesions or ulcers Psychiatry: Judgement and insight appear fair.  Mood and affect flat    Data Reviewed:   CBC: Recent Labs  Lab 08/22/21 0425 08/23/21 0456 08/24/21 0342 08/25/21 0159 08/26/21 0049 08/27/21 0450  WBC 7.3 4.9 8.0 9.3 7.9 7.0  NEUTROABS 4.7 3.4 6.4  --   --  5.2  HGB 10.3* 9.2* 9.8* 10.4* 10.5* 9.6*  HCT 31.6* 28.5* 29.7* 31.9* 32.7* 29.3*  MCV 97.2 98.3 96.4 97.0 97.6 96.7  PLT 146* 103* 105* 98* 109* 98*    Basic Metabolic Panel: Recent Labs  Lab 08/22/21 0425 08/23/21 0456 08/24/21 0342 08/25/21 0159 08/26/21 0049 08/27/21 0450  NA 132* 134* 134* 132* 135 135  K 3.9 3.5 3.4* 3.9 3.9 4.3  CL 106 111 107 105 105 107  CO2 19* 18* '22 23 22 '$ 21*  GLUCOSE 91 93 129* 154* 170* 151*  BUN 38* 39* 40* 40* 42* 40*  CREATININE 1.32* 1.48* 1.55* 1.65* 1.72* 1.71*  CALCIUM 8.2* 8.6* 8.1* 7.9* 7.8* 7.7*  MG 2.1  --   --  2.0  --  2.0  PHOS 4.6  --  3.1 3.1  --  3.6    GFR: Estimated Creatinine Clearance: 25.9 mL/min (A) (by C-G formula based on SCr of 1.71 mg/dL (H)).  Liver Function Tests: Recent Labs  Lab 08/21/21 0317 08/22/21 0425 08/23/21 0456 08/24/21 0342 08/25/21 0159 08/27/21 0450  AST 34 36 205*  --  46*  --   ALT 13 14 35  --  23  --   ALKPHOS 340* 340* 637*  --  593*  --   BILITOT 0.6 0.6 1.6*  --  0.7  --   PROT 5.3* 5.2* 5.3*  --  5.2*  --   ALBUMIN <1.5* <1.5* 2.6* 2.1* 1.9* 2.0*    CBG: No results for input(s): "GLUCAP" in the last 168 hours.   Recent Results (from the past 240 hour(s))  Urine Culture     Status: Abnormal (Preliminary result)   Collection Time: 08/23/21  8:22 AM   Specimen: Urine, Catheterized  Result Value Ref Range Status   Specimen Description URINE, CATHETERIZED  Final   Special Requests NONE  Final    Culture (A)  Final    >=100,000 COLONIES/mL ENTEROCOCCUS FAECIUM SUSCEPTIBILITIES TO FOLLOW Performed at [facility], [address] [place], [place] [address]    Report Status PENDING  Incomplete         Radiology Studies: US RENAL  Result Date: 08/26/2021 CLINICAL DATA:  Renal dysfunction EXAM: RENAL / URINARY TRACT ULTRASOUND COMPLETE COMPARISON:  08/21/2021 FINDINGS: Right Kidney: Renal measurements: 10.6 x 5.1 x 5.9 cm = volume: 168 mL. There is no hydronephrosis. There is increased cortical echogenicity. There is 3.5 cm cyst in the upper pole. There is 1.6 cm cyst in the midportion. Left Kidney: Renal measurements: 9.4 x 4.7 x 5.4 cm = volume: 124 mL. There is no hydronephrosis. There is increased cortical echogenicity. Bladder: Appears normal for degree of bladder distention. Other: Small ascites is seen. Minimal amount of perinephric fluid collection may be part of ascites. Spleen appears to be enlarged in size. There is possible right pleural effusion. IMPRESSION: There is no hydronephrosis. There is increased cortical echogenicity in both kidneys suggesting medical renal disease. Right renal cysts. Ascites.  Right pleural effusion. Electronically Signed   By: Elmer Picker M.D.   On: 08/26/2021 12:01        Scheduled Meds:  amLODipine  5 mg Oral Daily   apixaban  5 mg Oral BID   bisacodyl  10 mg Rectal Once   [START ON 08/28/2021] ciprofloxacin  500 mg Oral Q breakfast   dronabinol  5 mg Oral BID AC   levothyroxine  125 mcg Oral QAC breakfast   metroNIDAZOLE  500 mg Oral Q12H   multivitamin with minerals  1 tablet Oral Daily   nystatin  5 mL Oral QID   [START ON 08/28/2021] pantoprazole  40 mg Oral Daily   polyethylene glycol  17 g Oral Daily   sodium chloride flush  5 mL Intracatheter Daily   sodium chloride flush  5 mL Intracatheter Daily   [START ON 09/01/2021] thiamine injection  100 mg Intravenous Daily   Continuous Infusions:  albumin human 25 g  (08/27/21 1151)   thiamine injection 250 mg (08/27/21 1256)     LOS: 7 days    Time spent: 40 minutes    Irine Seal, MD [facility]   To contact the attending provider between 7A-7P or the covering provider during after hours  7P-7A, please log into the web site [email or web address] and access using universal [place] password for that web site. If you do not have the password, please call the hospital operator.  08/27/2021, 6:20 PM

## 2021-08-27 NOTE — Progress Notes (Addendum)
Supervising Physician: Ruthann Cancer  Patient Status:  [name] - In-pt  Chief Complaint:  Pancreatic cancer Cholecystitis--with perc chole drain on 5/5 and perihepatic drain on 5/10 Perihepatic drain removed on 6/9.    Subjective:  Patient asleep during exam today. Patient's family member was at bedside and stated that pt was in a lot of pain this AM, but had managed to fall asleep shortly before exam.   Allergies: Penicillins  Medications: Prior to Admission medications   Medication Sig Start Date End Date Taking? Authorizing Provider  ALPRAZolam (XANAX) 0.25 MG tablet Take 0.25 mg by mouth 2 (two) times daily as needed for anxiety. 08/02/21  Yes [provider]  amLODipine (NORVASC) 5 MG tablet Take 5 mg by mouth daily.   Yes [provider]  apixaban (ELIQUIS) 5 MG TABS tablet Take 5 mg by mouth 2 (two) times daily.   Yes [provider]  ceFEPIme (MAXIPIME) 2 g injection Inject 2 g into the muscle 2 (two) times daily. 08/20/21  Yes [provider]  cyanocobalamin (,VITAMIN B-12,) 1000 MCG/ML injection Inject 1 mL (1,000 mcg total) into the muscle every 30 (thirty) days. 01/11/21  Yes [name], MD  docusate sodium (COLACE) 100 MG capsule Take 100 mg by mouth 2 (two) times daily as needed (constipation).   Yes [provider]  famotidine (PEPCID) 20 MG tablet Take 1 tablet (20 mg total) by mouth daily. 08/02/21  Yes Hosie Poisson, MD  levothyroxine (SYNTHROID) 125 MCG tablet TAKE 1 TABLET ON AN EMPTY STOMACH IN THE MORNING Patient taking differently: Take 125 mcg by mouth daily before breakfast. 06/04/21  Yes [name], MD  lidocaine-prilocaine (EMLA) cream APPLY 1 APPLICATION TOPICALLY AS NEEDED. Patient taking differently: Apply 1 application. topically daily as needed (numbing). 07/04/21  Yes Ladell Pier, MD  omeprazole (PRILOSEC) 20 MG capsule TAKE 1 CAPSULE BY MOUTH EVERY DAY Patient taking differently: Take 20 mg by  mouth at bedtime. 04/18/21  Yes [name], MD  oxyCODONE (OXY IR/ROXICODONE) 5 MG immediate release tablet Take 5 mg by mouth 3 (three) times daily as needed for moderate pain or severe pain. 08/17/21  Yes [provider]  oxyCODONE-acetaminophen (PERCOCET/ROXICET) 5-325 MG tablet Take 1-2 tablets by mouth every 6 (six) hours as needed for severe pain. 08/17/21  Yes Horton, Barbette Hair, MD  polyethylene glycol powder (GLYCOLAX/MIRALAX) 17 GM/SCOOP powder Take 17 g by mouth daily. Patient taking differently: Take 17 g by mouth daily as needed for moderate constipation. 08/02/21  Yes Hosie Poisson, MD  prochlorperazine (COMPAZINE) 10 MG tablet Take 1 tablet (10 mg total) by mouth every 6 (six) hours as needed (Nausea or vomiting). 09/12/20  Yes Truitt Merle, MD  ACCU-CHEK AVIVA PLUS test strip As needed 05/09/21   [name], MD  Accu-Chek Softclix Lancets lancets As needed 04/23/21   [name], MD  Alcohol Swabs PADS USE AS DIRECTED 05/18/21   [name], MD  apixaban (ELIQUIS) 5 MG TABS tablet Take 2 tablets (10 mg total) by mouth 2 (two) times daily for 1 day. 08/01/21 08/20/21  Hosie Poisson, MD  apixaban (ELIQUIS) 5 MG TABS tablet Take 2 tablets (10 mg total) by mouth 2 (two) times daily for 1 day. then Take 1 tablet (5 mg total) by mouth 2 (two) times daily. 08/02/21   Hosie Poisson, MD  Blood Glucose Monitoring Suppl (ACCU-CHEK GUIDE) w/Device KIT As needed 05/11/21   [name], MD  calcium  carbonate (TUMS EX) 750 MG chewable tablet Chew 1 tablet by mouth daily.    [provider]  LORazepam (ATIVAN) 0.5 MG tablet Take 0.5 mg by mouth as needed for anxiety.    [provider]  metroNIDAZOLE (FLAGYL) 500 MG tablet Take 500 mg by mouth 2 (two) times daily. Last dose will be 08/20/21    [provider]  Multiple Vitamin (MULTIVITAMIN WITH MINERALS) TABS tablet Take 1 tablet by mouth daily. 08/02/21   Hosie Poisson, MD  sodium chloride 0.9 % infusion Inject  into the vein. 08/13/21   [provider]  traMADol (ULTRAM) 50 MG tablet Take 1 tablet (50 mg total) by mouth every 6 (six) hours as needed for moderate pain or severe pain. Patient not taking: Reported on 08/09/2021 08/01/21   Hosie Poisson, MD     Vital Signs: BP 133/90 (BP Location: Right Arm)   Pulse (!) 106   Temp 97.9 F (36.6 C)   Resp 17   Ht '5\' 3"'  (1.6 m)   Wt 72.8 kg   SpO2 95%   BMI 28.43 kg/m   Physical Exam Vitals reviewed.  Constitutional:      Appearance: She is ill-appearing.     Comments: Pt asleep during exam  HENT:     Head: Normocephalic and atraumatic.     Mouth/Throat:     Pharynx: Oropharynx is clear.  Eyes:     Extraocular Movements: Extraocular movements intact.  Cardiovascular:     Rate and Rhythm: Normal rate.  Pulmonary:     Effort: Pulmonary effort is normal.  Abdominal:     General: Abdomen is flat.     Palpations: Abdomen is soft.  Skin:    General: Skin is dry.     Coloration: Skin is pale.    Drain Location: RUQ Size: 10Fr Date of placement: 5/5 Currently to: Drain collection device: gravity 24 hour output:  Output by Drain (mL) 08/25/21 0701 - 08/25/21 1900 08/25/21 1901 - 08/26/21 0700 08/26/21 0701 - 08/26/21 1900 08/26/21 1901 - 08/27/21 0700 08/27/21 0701 - 08/27/21 1257  Closed System Drain RUQ 10.2 Fr.         Current examination: Chole drain: Flushes easily. Dark reddish-brown fluid in bag Insertion site clean, dry, and intact with no sign of infection Suture and stat lock in place; stat lock reinforced with tape. Dressed appropriately.      Imaging: US RENAL  Result Date: 08/26/2021 CLINICAL DATA:  Renal dysfunction EXAM: RENAL / URINARY TRACT ULTRASOUND COMPLETE COMPARISON:  08/21/2021 FINDINGS: Right Kidney: Renal measurements: 10.6 x 5.1 x 5.9 cm = volume: 168 mL. There is no hydronephrosis. There is increased cortical echogenicity. There is 3.5 cm cyst in the upper pole. There is 1.6 cm cyst in the  midportion. Left Kidney: Renal measurements: 9.4 x 4.7 x 5.4 cm = volume: 124 mL. There is no hydronephrosis. There is increased cortical echogenicity. Bladder: Appears normal for degree of bladder distention. Other: Small ascites is seen. Minimal amount of perinephric fluid collection may be part of ascites. Spleen appears to be enlarged in size. There is possible right pleural effusion. IMPRESSION: There is no hydronephrosis. There is increased cortical echogenicity in both kidneys suggesting medical renal disease. Right renal cysts. Ascites.  Right pleural effusion. Electronically Signed   By: Elmer Picker M.D.   On: 08/26/2021 12:01   EEG adult  Result Date: 08/23/2021 Lora Havens, MD     08/23/2021  8:07 PM Patient Name: Milas Gain  Sheard MRN: [id number] Epilepsy Attending: Lora Havens Referring Physician/Provider: Eugenie Filler, MD Date: 08/23/2021 Duration: 28.24 mins Patient history: 76yo F with ams and continuous jerking movements of both arms. EEG to evaluate for seizure. Level of alertness: lethargic AEDs during EEG study: None Technical aspects: This EEG study was done with scalp electrodes positioned according to the 10-20 International system of electrode placement. Electrical activity was acquired at a sampling rate of '500Hz'  and reviewed with a high frequency filter of '70Hz'  and a low frequency filter of '1Hz' . EEG data were recorded continuously and digitally stored. Description: No clear posterior dominant rhythm was seen. EEG showed continuous generalized polymorphic 3-'5Hz'  theta-delta slowing. Generalized periodic discharges with triphasic morphology at 1-1.'5Hz'  were also noted, more prominent when awake/stimulated. Hyperventilation and photic stimulation were not performed.    ABNORMALITY - Periodic discharges with triphasic morphology, generalized ( GPDs) - Continuous slow, generalized  IMPRESSION: This study showed generalized periodic discharges with triphasic morphology. This  EEG pattern is on the ictal-interictal continuum. However the morphology, frequency and reactivity to stimulation is more commonly indicative of toxic-metabolic causes like hyperammonemia, cefepime toxicity. Additionally there is moderate to severe diffuse encephalopathy. No seizures were seen throughout the recording.  If patient remains altered, can consider prolonged EEG monitoring.  Priyanka Barbra Sarks    CT HEAD WO CONTRAST (5MM)  Result Date: 08/23/2021 CLINICAL DATA:  Altered mental status EXAM: CT HEAD WITHOUT CONTRAST TECHNIQUE: Contiguous axial images were obtained from the base of the skull through the vertex without intravenous contrast. RADIATION DOSE REDUCTION: This exam was performed according to the departmental dose-optimization program which includes automated exposure control, adjustment of the mA and/or kV according to patient size and/or use of iterative reconstruction technique. COMPARISON:  06/01/2016 FINDINGS: Brain: No acute intracranial findings are seen in noncontrast CT brain. There are no signs of bleeding. Cortical sulci are prominent. There is decreased density in the periventricular white matter. Vascular: Unremarkable. Skull: Unremarkable. Sinuses/Orbits: Unremarkable. Other: No significant interval changes are noted. IMPRESSION: No acute intracranial findings are seen in noncontrast CT brain. Atrophy. Small-vessel disease. Electronically Signed   By: Elmer Picker M.D.   On: 08/23/2021 13:11    Labs:  CBC: Recent Labs    08/24/21 0342 08/25/21 0159 08/26/21 0049 08/27/21 0450  WBC 8.0 9.3 7.9 7.0  HGB 9.8* 10.4* 10.5* 9.6*  HCT 29.7* 31.9* 32.7* 29.3*  PLT 105* 98* 109* 98*     COAGS: Recent Labs    10/14/20 1457 06/30/21 0939 07/18/21 1503 07/19/21 0445 07/24/21 1306  INR 1.1 1.1 1.2 1.3* 1.2  APTT 45*  --  28  --   --      BMP: Recent Labs    08/24/21 0342 08/25/21 0159 08/26/21 0049 08/27/21 0450  NA 134* 132* 135 135  K 3.4* 3.9 3.9  4.3  CL 107 105 105 107  CO2 '22 23 22 ' 21*  GLUCOSE 129* 154* 170* 151*  BUN 40* 40* 42* 40*  CALCIUM 8.1* 7.9* 7.8* 7.7*  CREATININE 1.55* 1.65* 1.72* 1.71*  GFRNONAA 34* 32* 30* 30*     LIVER FUNCTION TESTS: Recent Labs    08/21/21 0317 08/22/21 0425 08/23/21 0456 08/24/21 0342 08/25/21 0159 08/27/21 0450  BILITOT 0.6 0.6 1.6*  --  0.7  --   AST 34 36 205*  --  46*  --   ALT 13 14 35  --  23  --   ALKPHOS 340* 340* 637*  --  593*  --  PROT 5.3* 5.2* 5.3*  --  5.2*  --   ALBUMIN <1.5* <1.5* 2.6* 2.1* 1.9* 2.0*     Assessment and Plan:   Acute cholecystitis SP cholecystostomy with IR on 5/5 Cholangiogram on 5/31 showed no cystic duct. Perc chole to remain in place.   On exam today, no dressing was placed over the drain. Asked RN to place dressing to keep the site cover.  Also, drain holding device was not secured. Paper tape placed to secure drain holding device.  T bili on 6/10 normal, no CMP obtained since 6/10.  No output documented in chart. - RN asked to document output q shift.   PLAN  -Continue TID flushes with 5 cc NS. -Record output Q shift. -Dressing changes QD or PRN if soiled.  -Call IR APP or on call IR MD if difficulty flushing or sudden change in drain output.     Discharge planning: Please contact IR APP or on call IR MD prior to patient d/c to ensure appropriate follow up plans are in place. Typically patient will follow up with IR clinic 10-14 days post d/c for repeat imaging/possible drain injection. IR scheduler will contact patient with date/time of appointment. Patient will need to flush drain QD with 5 cc NS, record output QD, dressing changes every 2-3 days or earlier if soiled.   IR will continue to follow - please call with questions or concerns.      Electronically Signed: Lura Em, PA 08/27/2021, 12:57 PM   I spent a total of 15 Minutes at the the patient's bedside AND on the patient's hospital floor or unit, greater than  50% of which was counseling/coordinating care for drain management

## 2021-08-27 NOTE — Progress Notes (Addendum)
HEMATOLOGY-ONCOLOGY PROGRESS NOTE  ASSESSMENT AND PLAN: Adenocarcinoma the pancreas body, stage IV (cT4,cN0,pM1) Ultrasound abdomen 08/18/2020-possible hypoechoic pancreas body mass, small hypoechoic liver areas MRI abdomen 08/27/2020-pancreas body mass, multiple hepatic lesions consistent with metastases, right abdominal omental nodularity, pancreas mass extends to the celiac bifurcation and abuts the splenic vein and splenoportal confluence CT chest 09/07/2020-scattered pulmonary nodules concerning for metastases, pancreas body mass, hepatic metastases Ultrasound-guided biopsy of a right liver lesion 09/08/2020-adenocarcinoma consistent with a pancreas primary CT abdomen/pelvis 09/24/2020-pancreas neck mass, omental and peritoneal nodularity-unchanged, multiple hypoenhancing liver masses-unchanged, numerous small pulmonary nodules Cycle 1 gemcitabine/Abraxane 10/04/2020 Cycle 2 gemcitabine/Abraxane 10/27/2020 Cycle 3 gemcitabine/Abraxane 11/11/2018 Cycle 4 gemcitabine/Abraxane 11/23/2020 Cycle 5 gemcitabine/Abraxane 12/08/2020 CT abdomen/pelvis 12/18/2020-stable pancreas mass, stable and improved liver lesions, resolution of omental soft tissue density, stable indeterminate lung nodules, no disease progression Cycle 6 gemcitabine/Abraxane 12/22/2020 Cycle 7 gemcitabine 01/05/2021, 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  Cycle 8 gemcitabine/Abraxane 01/19/2021 Cycle 9 gemcitabine/Abraxane 02/01/2021 Cycle 10 Gemcitabine 02/16/2021, Abraxane held due to increased neuropathy Cycle 11 Gemcitabine 03/05/2021, 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 Cycle 12 gemcitabine 03/14/2021, 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 Cycle 13 gemcitabine 04/02/2021, 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 Cycle 14 gemcitabine/Abraxane 04/16/2021, Abraxane resumed at a reduced dose Cycle 15 gemcitabine/Abraxane 04/30/2021 CT abdomen/pelvis 05/08/2021-stable pancreas mass, liver metastases are slightly smaller, stable tiny bilateral lower lung nodules Cycle  16 gemcitabine/Abraxane 05/14/2021, Abraxane escalated back to 100 mg per metered squared Cycle 17 gemcitabine/Abraxane 05/28/2021 Cycle 18 gemcitabine/Abraxane 06/11/2021 Cycle 19 gemcitabine/Abraxane 07/09/2021 CT Abdo/pelvis 07/18/2021-diffuse gallbladder wall thickening and edema, small volume ascites, stable pulmonary nodules, indeterminate 15 x 18 mm right liver lesion, ill-defined hypodensity in the body of the pancreas 07/24/2021-right pleural fluid cytology-negative   2.  Pain secondary #1 3.  Diabetes 4.  Hypertension 5.  Osteoarthritis 6.  Port-A-Cath placement 09/22/2020 7.  Admission with jaundice 09/24/2020.  MRI-new abrupt constriction of the common hepatic duct.  The infiltrative pancreatic mass extends medially in the porta hepatis to obstruct the common hepatic duct.  Multiple right hepatic lobe metastases mildly increased in size.  Stent placed into the common bile duct 09/28/2020. 8.  COVID-19 06/20/2021, admission with COVID and bacterial pneumonia?  06/30/2021-completed course of antibiotics 9.  Hospital admission 07/18/2021-bacteremia, UTI, acute cholecystitis Cholecystostomy tube 07/20/2021 CT abdomen/pelvis 07/24/2021-moderate right pleural effusion, new subcapsular fluid collections adjacent to the right liver, interval cholecystostomy tube, stable pancreas body mass, incidental pulmonary embolism and a left lower lobe pulmonary artery Drain in perihepatic fluid collection 07/27/2021 10.  Pulmonary embolism/bilateral DVTs CT abdomen/pelvis 07/24/2021-incidental left lower lobe pulmonary embolism CT chest 07/24/2021-segmental and subsegmental bilateral pulmonary emboli Dopplers 07/24/2021-DVTs in the right common femoral, left common femoral, left profunda veins Heparin 07/24/2021, converted to apixaban on discharge 08/01/2021 11.  Bilateral foot numbness developing during hospital admission May 2023-Abraxane neuropathy? [facility] admission 08/20/2021-dehydration, weakness  [name] was more  alert this morning and able to converse but this afternoon is not waking up very well.  She remains very fatigued and weak.  We have discussed this with the patient and her daughter.  She is not a candidate for systemic chemotherapy at this point.  We have again discussed consideration of hospice but the patient and daughter have indicated that they had a bad experience in the past with hospice and did not wish to consider this.  She continues on IV fluids and is having increased edema.  Received albumin earlier today.  Recommend cutting back on IV fluids if possible.  If she is  able to be more awake, recommend working with therapy to increase ambulation.  Will likely need placement in SNF upon hospital discharge.  Recommendations: 1.  Recommend cutting back on IV fluids due to edema. 2.  Antibiotics per ID. 3.  Increase ambulation if tolerated. 4.  Recommend referral to hospice if she does not start to improve soon and patient and family agreeable. 5.  If she improves, will need placement upon hospital discharge.  Likely SNF.  [name]  [name] was interviewed and examined.  Her daughter was at the bedside when I saw her this morning.  She is more alert.  She remains weak.  She is not a candidate for further systemic therapy.  I discussed home hospice care with [name] and her daughter.  She does not wish to enroll in hospice at present.  I will continue these discussions over the next few days.  She will need skilled nursing facility placement if she does not enroll in hospice care.  I suspect the global decline in her performance status is in large part related to metastatic pancreas cancer.  She and her daughter are concerned about anasarca.  This is secondary to hypoalbuminemia.  We can decrease the IV fluids and see if she can maintain hydration/nutrition by mouth.    I was present for greater than 50% of today's visit.  I performed medical decision making.  SUBJECTIVE: [name] was more alert this morning but when seen this afternoon, she is sleeping quietly.  Her husband and sister-in-law are at the bedside.  Required I&O cath this morning.  Still not having any significant urine output.  She has had some increased pain this morning and received pain medication.  She has significant edema to her lower extremities and upper arms.  Oncology History Overview Note  Cancer Staging Primary pancreatic cancer with metastasis to other site [facility]) Staging form: Exocrine Pancreas, AJCC 8th Edition - Clinical stage from 09/08/2020: Stage IV (cT4, cN0, pM1) - Signed by Truitt Merle, MD on 09/12/2020 Stage prefix: Initial diagnosis Total positive nodes: 0    Primary pancreatic cancer with metastasis to other site ([place])  08/27/2020 Imaging   Abdominal MRI w wo contrast: IMPRESSION: 1. Pancreatic body infiltrative mass, favoring adenocarcinoma. 2. Multiple hepatic lesions, most consistent with metastasis. 3. Right abdominal omental nodularity, highly suspicious for peritoneal metastasis. 4. Recommend multidisciplinary oncology consultation for eventual sampling of either the liver or pancreatic lesions. 5. Vascular involvement with tumor, as detailed above.   09/07/2020 Imaging   CT chest  IMPRESSION: 1. Scattered small pulmonary nodules, worrisome for metastatic disease. 2. Pancreatic body mass and hepatic metastases, better seen and described on MR abdomen 08/27/2020. 3. Aortic atherosclerosis (ICD10-I70.0). Coronary artery calcification.   09/08/2020 Cancer Staging   Staging form: Exocrine Pancreas, AJCC 8th Edition - Clinical stage from 09/08/2020: Stage IV (cT4, cN0, pM1) - Signed by Truitt Merle, MD on 09/12/2020 Stage prefix: Initial diagnosis Total positive nodes: 0   09/08/2020 Pathology Results   A. LIVER, RIGHT, BIOPSY:  - Adenocarcinoma.  See comment.   COMMENT:   The morphology is consistent with a pancreatic primary.    09/12/2020 Initial Diagnosis    Primary pancreatic cancer with metastasis to other site [facility])   10/12/2020 -  Chemotherapy   Patient is on Treatment Plan : PANCREATIC Abraxane / Gemcitabine D1,8,15 q28d       PHYSICAL EXAMINATION:  Vitals:   08/27/21 0531 08/27/21 0806  BP: 129/83 133/90  Pulse: 95 Marland Kitchen)  106  Resp: 17 17  Temp: 97.7 F (36.5 C) 97.9 F (36.6 C)  SpO2: 95% 95%   Filed Weights   08/20/21 2200  Weight: 72.8 kg    Intake/Output from previous day: 06/11 0701 - 06/12 0700 In: 1721.3 [I.V.:852.1; IV Piggyback:869.2] Out: 1900 [Urine:1900]  Exam: Sleeping, I did not awaken HEENT-no thrush Cardiac-regular rate and rhythm Lungs-clear anteriorly Abdomen-two right upper abdomen drains, drainage, no mass, mildly distended Vascular-pitting edema to the bilateral lower extremities and upper extremities  LABORATORY DATA:  I have reviewed the data as listed    Latest Ref Rng & Units 08/27/2021    4:50 AM 08/26/2021   12:49 AM 08/25/2021    1:59 AM  CMP  Glucose 70 - 99 mg/dL 151  170  154   BUN 8 - 23 mg/dL 40  42  40   Creatinine 0.44 - 1.00 mg/dL 1.71  1.72  1.65   Sodium 135 - 145 mmol/L 135  135  132   Potassium 3.5 - 5.1 mmol/L 4.3  3.9  3.9   Chloride 98 - 111 mmol/L 107  105  105   CO2 22 - 32 mmol/L '21  22  23   '$ Calcium 8.9 - 10.3 mg/dL 7.7  7.8  7.9   Total Protein 6.5 - 8.1 g/dL   5.2   Total Bilirubin 0.3 - 1.2 mg/dL   0.7   Alkaline Phos 38 - 126 U/L   593   AST 15 - 41 U/L   46   ALT 0 - 44 U/L   23     Lab Results  Component Value Date   WBC 7.0 08/27/2021   HGB 9.6 (L) 08/27/2021   HCT 29.3 (L) 08/27/2021   MCV 96.7 08/27/2021   PLT 98 (L) 08/27/2021   NEUTROABS 5.2 08/27/2021    Lab Results  Component Value Date   CAN199 16,591 (H) 08/09/2021    US RENAL  Result Date: 08/26/2021 CLINICAL DATA:  Renal dysfunction EXAM: RENAL / URINARY TRACT ULTRASOUND COMPLETE COMPARISON:  08/21/2021 FINDINGS: Right Kidney: Renal measurements: 10.6 x 5.1 x 5.9 cm = volume: 168  mL. There is no hydronephrosis. There is increased cortical echogenicity. There is 3.5 cm cyst in the upper pole. There is 1.6 cm cyst in the midportion. Left Kidney: Renal measurements: 9.4 x 4.7 x 5.4 cm = volume: 124 mL. There is no hydronephrosis. There is increased cortical echogenicity. Bladder: Appears normal for degree of bladder distention. Other: Small ascites is seen. Minimal amount of perinephric fluid collection may be part of ascites. Spleen appears to be enlarged in size. There is possible right pleural effusion. IMPRESSION: There is no hydronephrosis. There is increased cortical echogenicity in both kidneys suggesting medical renal disease. Right renal cysts. Ascites.  Right pleural effusion. Electronically Signed   By: Elmer Picker M.D.   On: 08/26/2021 12:01   EEG adult  Result Date: 08/23/2021 Lora Havens, MD     08/23/2021  8:07 PM Patient Name: ROLLANDE THURSBY MRN: [id number] Epilepsy Attending: Lora Havens Referring Physician/Provider: Eugenie Filler, MD Date: 08/23/2021 Duration: 28.24 mins Patient history: 76yo F with ams and continuous jerking movements of both arms. EEG to evaluate for seizure. Level of alertness: lethargic AEDs during EEG study: None Technical aspects: This EEG study was done with scalp electrodes positioned according to the 10-20 International system of electrode placement. Electrical activity was acquired at a sampling rate of '500Hz'$  and reviewed with a high  frequency filter of '70Hz'$  and a low frequency filter of '1Hz'$ . EEG data were recorded continuously and digitally stored. Description: No clear posterior dominant rhythm was seen. EEG showed continuous generalized polymorphic 3-'5Hz'$  theta-delta slowing. Generalized periodic discharges with triphasic morphology at 1-1.'5Hz'$  were also noted, more prominent when awake/stimulated. Hyperventilation and photic stimulation were not performed.    ABNORMALITY - Periodic discharges with triphasic morphology,  generalized ( GPDs) - Continuous slow, generalized  IMPRESSION: This study showed generalized periodic discharges with triphasic morphology. This EEG pattern is on the ictal-interictal continuum. However the morphology, frequency and reactivity to stimulation is more commonly indicative of toxic-metabolic causes like hyperammonemia, cefepime toxicity. Additionally there is moderate to severe diffuse encephalopathy. No seizures were seen throughout the recording.  If patient remains altered, can consider prolonged EEG monitoring.  Priyanka Barbra Sarks    CT HEAD WO CONTRAST (5MM)  Result Date: 08/23/2021 CLINICAL DATA:  Altered mental status EXAM: CT HEAD WITHOUT CONTRAST TECHNIQUE: Contiguous axial images were obtained from the base of the skull through the vertex without intravenous contrast. RADIATION DOSE REDUCTION: This exam was performed according to the departmental dose-optimization program which includes automated exposure control, adjustment of the mA and/or kV according to patient size and/or use of iterative reconstruction technique. COMPARISON:  06/01/2016 FINDINGS: Brain: No acute intracranial findings are seen in noncontrast CT brain. There are no signs of bleeding. Cortical sulci are prominent. There is decreased density in the periventricular white matter. Vascular: Unremarkable. Skull: Unremarkable. Sinuses/Orbits: Unremarkable. Other: No significant interval changes are noted. IMPRESSION: No acute intracranial findings are seen in noncontrast CT brain. Atrophy. Small-vessel disease. Electronically Signed   By: Elmer Picker M.D.   On: 08/23/2021 13:11   CT ABDOMEN PELVIS W CONTRAST  Result Date: 08/21/2021 CLINICAL DATA:  Cholecystitis status post cholecystostomy tube placement. History of pancreatic cancer. EXAM: CT ABDOMEN AND PELVIS WITH CONTRAST TECHNIQUE: Multidetector CT imaging of the abdomen and pelvis was performed using the standard protocol following bolus administration of  intravenous contrast. RADIATION DOSE REDUCTION: This exam was performed according to the departmental dose-optimization program which includes automated exposure control, adjustment of the mA and/or kV according to patient size and/or use of iterative reconstruction technique. CONTRAST:  110m OMNIPAQUE IOHEXOL 300 MG/ML  SOLN COMPARISON:  CT abdomen pelvis dated August 16, 2021. FINDINGS: Lower chest: Unchanged moderate right and small left pleural effusions with adjacent atelectasis in both posterior lower lobes. Hepatobiliary: Multiple hypodense liver lesions are unchanged. Right perihepatic drainage catheter remains in place without significant residual perihepatic fluid collection. 2.2 cm posteroinferior perihepatic fluid collection is unchanged (series 3, image 20). New small amount of pneumobilia in the left liver. Multiple gallstones again noted with slightly increased distention of the gallbladder status post cholecystostomy tube placement. Unchanged common bile duct stent. Pancreas: Unchanged hypodense mass in the pancreatic neck with atrophy of the body and tail. Spleen: Unchanged wedge-shaped hypodensity in the inferior spleen, likely a small infarct. Normal in size. Adrenals/Urinary Tract: Adrenal glands are unremarkable. Unchanged small right renal simple cysts. No follow-up imaging is recommended. No renal calculi or hydronephrosis. The bladder is distended with excreted contrast. Stomach/Bowel: Unchanged small hiatal hernia. The stomach is otherwise within normal limits. No bowel wall thickening, distention, or surrounding inflammatory changes. Scattered mild colonic diverticulosis. Similar moderate stool burden throughout the colon. Normal appendix. 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 Aortoiliac atherosclerotic vascular disease. No enlarged abdominal  or pelvic lymph  nodes. Reproductive: Status post hysterectomy. No adnexal masses. Other: Decreased small volume ascites in the pelvis. No pneumoperitoneum. Musculoskeletal: No acute or significant osseous findings. Unchanged anasarca. IMPRESSION: 1. Status post cholecystostomy tube placement with slightly increased distention of the gallbladder compared to prior study. Correlate with tube function. 2. Right perihepatic drainage catheter remains in place without significant residual perihepatic fluid collection around the drain. Unchanged 2.2 cm posteroinferior perihepatic fluid collection separate from the drain. 3. 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 4. Decreased small volume ascites in the pelvis. 5. Unchanged moderate right and small left pleural effusions. 6. 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 7. Aortic Atherosclerosis (ICD10-I70.0). Electronically Signed   By: Titus Dubin M.D.   On: 08/21/2021 17:58   US RENAL  Result Date: 08/21/2021 CLINICAL DATA:  Acute kidney injury EXAM: RENAL / URINARY TRACT ULTRASOUND COMPLETE COMPARISON:  CT abdomen 08/16/2021 FINDINGS: Right Kidney: Renal measurements: 9.5 by 4.0 by 5.0 cm = volume: 102 mL. Mildly hyperechoic relative to the liver. No solid mass or hydronephrosis visualized. A right kidney upper pole cyst measures 2.0 by 3.0 by 2 2.6 cm and appears simple. Left Kidney: Renal measurements: 9.7 by 4.0 by 4.8 cm = volume: 99 mL. Mildly hyperechoic relative to the spleen. No mass or hydronephrosis visualized. Bladder: Appears normal for degree of bladder distention. A left ureteral jet is visualized. Ascites in the lower abdomen. IMPRESSION: 1. Bilateral renal parenchymal hyperechogenicity suggesting chronic medical renal disease. 2. Right kidney upper pole simple appearing cyst. 3. A left ureteral jet is visualized in the urinary bladder.  Right ureteral jet not seen, although this may simply be incidental. 4. Pelvic ascites. Electronically Signed   By: Van Clines M.D.   On: 08/21/2021 16:00   DG CHEST PORT 1 VIEW  Result Date: 08/20/2021 CLINICAL DATA:  Status post PICC line placement. EXAM: PORTABLE CHEST 1 VIEW COMPARISON:  07/27/2021. FINDINGS: The heart size and mediastinal contours are within normal limits. There is atherosclerotic calcification of the aorta. Patchy airspace disease is noted at the right lung base. There are questionable small bilateral pleural effusions. No pneumothorax. A right chest port is stable. No PICC line is identified. A stable pigtail catheter is noted in the right upper quadrant. No acute osseous abnormality. IMPRESSION: 1. Small bilateral pleural effusions with atelectasis at the lung bases. 2. No PICC line is identified.  Clinical correlation is recommended. Electronically Signed   By: Brett Fairy M.D.   On: 08/20/2021 23:45   CT ABDOMEN PELVIS W CONTRAST  Result Date: 08/17/2021 CLINICAL DATA:  Abdominal pain, acute, nonlocalized EXAM: CT ABDOMEN AND PELVIS WITH CONTRAST TECHNIQUE: Multidetector CT imaging of the abdomen and pelvis was performed using the standard protocol following bolus administration of intravenous contrast. RADIATION DOSE REDUCTION: This exam was performed according to the departmental dose-optimization program which includes automated exposure control, adjustment of the mA and/or kV according to patient size and/or use of iterative reconstruction technique. CONTRAST:  40m OMNIPAQUE IOHEXOL 300 MG/ML  SOLN COMPARISON:  08/15/2021 FINDINGS: Lower chest: Small bilateral pleural effusions. Compressive atelectasis in the lower lobes. Findings stable since prior study. Hepatobiliary: Right perihepatic drainage catheter remains in place with near complete resolution of perihepatic fluid collection. Inferior perihepatic fluid collection again noted, measuring 2.3 cm, stable. Numerous  hypodensities throughout the liver are again seen and unchanged compatible with metastases. Cholecystostomy tube in  place. Gallbladder decompressed. Gallstone noted within the gallbladder. Common bile duct stent noted, unchanged. Pancreas: Ill-defined hypodensity in the body of the pancreas compatible with known pancreatic malignancy. Pancreatic tail atrophy. Spleen: No focal abnormality.  Normal size. Adrenals/Urinary Tract: Right upper pole renal cyst. No hydronephrosis. Adrenal glands and urinary bladder unremarkable. Stomach/Bowel: Large stool burden in the colon. Sigmoid diverticulosis. Stomach and small bowel decompressed. Vascular/Lymphatic: Aortoiliac atherosclerosis. No adenopathy or aneurysm. Reproductive: Prior hysterectomy.  No adnexal masses. Other: Moderate free fluid in the pelvis, unchanged. Musculoskeletal: No acute bony abnormality. IMPRESSION: Essentially stable CT since 2 days prior. Small bilateral pleural effusions with compressive atelectasis. Numerous metastases within the liver, stable. Perihepatic fluid collections are stable. Drainage catheter within a right perihepatic fluid collection and in the gallbladder. Pancreatic body mass compatible with known pancreatic malignancy. Pancreatic tail atrophy. Large stool burden in the colon. Aortic atherosclerosis. Moderate free fluid in the pelvis. Electronically Signed   By: Rolm Baptise M.D.   On: 08/17/2021 02:22   CT ABDOMEN PELVIS W CONTRAST  Result Date: 08/15/2021 CLINICAL DATA:  Briefly, 76 year old female with a history of pancreatic tumor, perihepatic fluid collection and acute cholecystitis s/p perihepatic drain and cholecystostomy drain placement. EXAM: CT ABDOMEN AND PELVIS WITH CONTRAST TECHNIQUE: Multidetector CT imaging of the abdomen and pelvis was performed using the standard protocol following bolus administration of intravenous contrast. RADIATION DOSE REDUCTION: This exam was performed according to the departmental  dose-optimization program which includes automated exposure control, adjustment of the mA and/or kV according to patient size and/or use of iterative reconstruction technique. CONTRAST:  145m ISOVUE-300 IOPAMIDOL (ISOVUE-300) INJECTION 61% COMPARISON:  IR fluoroscopy, earlier same day.  CT AP, 07/24/2021. FINDINGS: Lower chest: Persistent small volume bilateral pleural effusions, RIGHT-greater-than LEFT, and adjacent dependent consolidations Hepatobiliary: *Well-positioned percutaneous drainage catheter with near-complete resolution of previously large RIGHT perihepatic fluid collection. *Trace residual collection along the hepatic dome measures up to 5.0 x 1.0 cm, previously 17 cm. *Additional inferomedial perihepatic fluid collection measures 3.0 x 1.7 cm, previously 6 cm *Multifocal hypodense lesions within the LEFT and RIGHT hepatic lobes, largest at hepatic segment VI measuring 3.5 x 2.5 cm, suspicious for hepatic metastases. *Stable positioning of cholecystostomy drain with contracted gallbladder. Metallic biliary stent. No intra or extrahepatic biliary ductal dilation. Pancreas: Normal contrast enhancement of the pancreatic head. Similar ill-defined hypodensity within the body of pancreas and pancreatic tail atrophy. Findings compatible with known pancreatic malignancy Spleen: New area of hypodensity within the inferior splenic pole, in an area measuring 3.0 x 2.0 cm. Adrenals/Urinary Tract: Adrenal glands are unremarkable. RIGHT exophytic and renal cortical renal cysts, unchanged. The LEFT kidney is normal. No renal calculi or hydronephrosis. Bladder is unremarkable. Stomach/Bowel: Small hiatus hernia. Nonobstructed bowel. Severe burden of sigmoid diverticulosis. No evidence of bowel wall thickening, distention, or inflammatory changes. Vascular/Lymphatic: Aortic atherosclerosis without aneurysmal dilation. No enlarged abdominal or pelvic lymph nodes. Reproductive: Status post hysterectomy. No adnexal mass.  Other: Small volume of residual ascites within the dependent pelvis. Musculoskeletal: Mild body wall edema. Multilevel spinal degenerative change. No interval osseous abnormality. IMPRESSION: Since CT AP dated 07/24/2021; 1. Well-positioned percutaneous drainage catheter with near-complete resolution of previously large perihepatic free fluid collection. Small volume residual inferomedial hepatic fluid collection. 2. Stable positioning of cholecystostomy drain with contracted gallbladder. 3. Metallic biliary stent without intra- or extrahepatic biliary ductal dilation. 4. Pancreatic body mass consistent with known malignancy. Increased conspicuity of multifocal hepatic lesions, as above, is suspicious for metastases. 5. New hypodensity within the  inferior splenic pole, suspicious for regional hypoperfusion and splenic infarct. 6. Bilateral small volume pleural effusions, greater on RIGHT. Additional incidental, chronic and senescent findings as above. These results will be called to the ordering clinician or representative by the Radiologist Assistant, and communication documented in the PACS or [employer]. Electronically Signed   By: Michaelle Birks M.D.   On: 08/15/2021 17:41   DG Sinus/Fist Tube Chk-Non GI  Result Date: 08/15/2021 CLINICAL DATA:  Briefly, 76 year old female with a history of pancreatic tumor, perihepatic fluid collection and acute cholecystitis s/p perihepatic drain and cholecystostomy drain placement. EXAM: CHOLECYSTOSTOMY DRAIN INJECTION COMPARISON:  CT AP, concurrent and 07/24/2021.  IR CT, 07/25/2021. CONTRAST:  40 mL Omnipaque 300-administered via the existing cholecystostomy drain. FLUOROSCOPY TIME:  Fluoroscopic dose; 11.3 mGy TECHNIQUE: The patient was positioned supine on the fluoroscopy table. A preprocedural spot fluoroscopic image was obtained of the RIGHT upper quadrant and the existing percutaneous cholecystostomy drainage catheter. Multiple spot fluoroscopic and radiographic  images were obtained following the injection of a small amount of contrast via the existing percutaneous cholecystostomy catheter. A contrast injection for the perihepatic drainage catheter was not performed. Procedure performed by Pasty Spillers, IR PA FINDINGS: Cholecystostomy drain injection without evidence of cystic duct patency. Metallic biliary stent. IMPRESSION: No fluoroscopic evidence of cystic duct patency. The cholecystostomy drain was therefore LEFT in place. Electronically Signed   By: Michaelle Birks M.D.   On: 08/15/2021 16:41   IR Radiologist Eval & Mgmt  Result Date: 08/15/2021 Please refer to notes tab for details about interventional procedure. (Op Note)    Future Appointments  Date Time Provider [place]  09/05/2021 10:00 AM Rosiland Oz, MD [facility]  10/17/2021  3:00 PM [name], MD [facility]  12/10/2021 11:45 AM [facility] HEALTH COACH [facility]      LOS: 7 days

## 2021-08-27 NOTE — Progress Notes (Signed)
OT Cancellation Note  Patient Details Name: [name] MRN: [id number] DOB: 1945-12-29   Cancelled Treatment:    Reason Eval/Treat Not Completed: Other (comment) Attempted x2 to see pt this PM with nursing changing catheter one one attempt and the next attempt, pt "finally" sleeping and family declined to wake her up. Asked family that was present if pt/family desired therapy services though they would not answer, deferred this question to daughter whenever she is present. Will not check back again for OT eval today. Will follow up tomorrow.  Layla Maw 08/27/2021, 1:26 PM

## 2021-08-27 NOTE — Assessment & Plan Note (Addendum)
-   Bladder scan done 08/26/2021 with 1200 cc of urine noted per RN. -I and O cath done on 08/26/2021 with 900 cc of urine output. -Serial bladder scans, I and O cath every 6 hours as needed was ordered.. -Foley catheter placed due to urinary retention.

## 2021-08-27 NOTE — Progress Notes (Signed)
   Palliative Medicine Inpatient Follow Up Note   The Palliative care team continues to shadow [name]'s chart.   We will not re-engage unless explicitly asked to do so.   No Charge ______________________________________________________________________________________ [place] Team Team Cell Phone: [phone] Please utilize secure chat with additional questions, if there is no response within 30 minutes please call the above phone number  Palliative Medicine Team providers are available by phone from 7am to 7pm daily and can be reached through the team cell phone.  Should this patient require assistance outside of these hours, please call the patient's attending physician.

## 2021-08-27 NOTE — TOC Progression Note (Signed)
Transition of Care [facility]) - Progression Note    Patient Details  Name: [name] MRN: [id number] Date of Birth: 1945-09-14  [facility] CM/SW Contact  Jacalyn Lefevre Edson Snowball, RN Phone Number: 08/27/2021, 10:37 AM  Clinical Narrative:     Discussed disposition with daughter. PT recommending SNF. Daughter wants to take her mother home at discharge.   NCM confirmed with Jermaine with [employer] he has orders and clinicals for hospital bed, hoyer, bedside table and rolling walker. Once anticipated discharge date is known [employer] will call [employer] and schedule delivery.   Home health PT,OT,RN and aide have been arranged with [facility]   Expected Discharge Plan: [place] Barriers to Discharge: No Barriers Identified  Expected Discharge Plan and Services Expected Discharge Plan: [place]   Discharge Planning Services: CM Consult Post Acute Care Choice: Durable Medical Equipment, Home Health Living arrangements for the past 2 months: Single Family Home                 DME Arranged: Hospital bed, Walker rolling DME Agency: [employer] Date DME Agency Contacted: 08/22/21 Time DME Agency Contacted: [id number] Representative spoke with at DME Agency: Alecia Lemming HH Arranged: RN, PT, OT, Nurse's Aide [place] Agency: [place] Date [place]: 08/22/21 Time [place]: [id number] Representative spoke with at [place]: [place] ([place]) Interventions    Readmission Risk Interventions     No data to display

## 2021-08-27 NOTE — Progress Notes (Signed)
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 ([place])   Weakness   Decubitus skin ulcer   Acute metabolic encephalopathy   Cholecystitis   VTE (venous thromboembolism)   AKI (acute kidney injury) ([place])   Protein-calorie malnutrition, severe ([name])   Palliative care by specialist   Oral thrush          Assessment: [name] direct admitted due to N/V and decreased PO intake   # Dehydration # Weakness 2/2 decrease PO intake  # Hx of Subcapsular fluid collection adjacent to the liver(17.6 cm) with Cx NG #Small perihepatic fluid collection(2.2cm)-separate from drain # Hx of Acute cholecystitis SP cholecystostomy  with IR on 5/5 with Cx with Morganella morganii/B ovatus and E coli  # Hx of  Morganella morganii and E coli  bacteremia on 5/3  #Adenocarcinoma of pancrease, 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 SP CT guided drainage of perihepatic fluid 5/10. Cx no growth. She was discharged on vanc and cefepime with EOT 08/20/21 -CT on 6/2 showed near complete resolution of previous large perihepatic free fluid collection. Small volume residual inferomedial hepatic fluid collection, measuring 2.3 cm,stable. . Plan was for capping trial and 2 week follow-up with IR.  -She presented ot ED on 6/1 with abdominal pain. CT(6/6) showed rt perihepatic drainage catheter in place with near complete resolution of perihepatic fluid collection. Slightly increased distension of gallbladder. Numerous liver mets. Unchanged 2.2 cm posteroinferior  perihepatic fluid collection separate from the drain.  -Seen by ID on 6/5, and found to have PO intolerance. She was direct admitted for IVF(did not want to fo to ED). She noted abdominal pain is getting better. -Pt had worsening mental status on 6/8, noted to received remaron/xanax. Neurology consulted and concerned with cefepime toxicity. Cefepime switched to ciprofloxacin. Pallative care on board, pending weekend progressio, may place hospice referral -IR consulted and perihepatic drain removed on 6/9, perc choli drain remains -Oncology following   Recommendations:  -Transition ciprofloxacin and  metronidazole to PO as pt is tolerating solids. Antibiotic duration, pending clinical and radiographic progression.  -Gallbladder distension noted with perc tube in place since 5/10. Engage General surgery for possible surgical intervention.  -Engage IR to see if able to aspirate 2.2 cm fluid collection as it is unchanged.  -Monitor for PO tolerance, management per primary. -Will reschedule ID appt with Manandhar on 6/21  #Urine Cx+ E faecium -Represent asymptomatic bacteruria- pt has no urinary complaints.   Microbiology:   Antibiotics: Vancomycin 5/3 Cefepime 5/3, 5/6-p Aztreonam 5/3, Ceftriaxone 5/4-5/6 Metronidazole 5/3-p     Cultures:   Prior hospitalization: 5/3 blood Cx+ 2/2 Morgenella Morgani and 1/2 Ecoli 5/5 GB abscess Cx+ Ecoli, morganella morganii and bacteroides ovatus(beta lactamase positive) 5/10liver abscess perihepatic collection with NG  SUBJECTIVE: Resting in bed, tolerating pO per daughter at bedside  Review of Systems: Review of Systems  All other systems reviewed and are negative.    Scheduled Meds:  amLODipine  5 mg Oral Daily   apixaban  5 mg Oral BID   bisacodyl  10 mg Rectal Once   [START ON 08/28/2021] ciprofloxacin  500 mg Oral Q breakfast   dronabinol  5 mg Oral BID AC   levothyroxine  125 mcg Oral QAC breakfast   metroNIDAZOLE  500 mg Oral Q12H    multivitamin with minerals  1 tablet Oral Daily   nystatin  5 mL Oral QID   pantoprazole (PROTONIX) IV  40 mg Intravenous Q24H   polyethylene glycol  17 g Oral Daily   sodium chloride flush  5 mL Intracatheter Daily   sodium chloride flush  5 mL Intracatheter Daily   tamsulosin  0.4 mg Oral QPC supper   [START ON 09/01/2021] thiamine injection  100 mg Intravenous Daily   Continuous Infusions:  sodium chloride 125 mL/hr at 08/27/21 0623   albumin human 25 g (08/27/21 1151)   thiamine injection 250 mg (08/27/21 1256)   PRN Meds:.ALPRAZolam, docusate sodium, ibuprofen, iohexol, oxyCODONE, prochlorperazine, sodium chloride flush Allergies  Allergen Reactions   Penicillins Hives and Other (See Comments)    Tolerated Ceftriaxone 2023  Has patient had a PCN reaction causing immediate rash, facial/tongue/throat swelling, SOB or lightheadedness with hypotension: No Has patient had a PCN reaction causing severe rash involving mucus membranes or skin necrosis: No Has patient had a PCN reaction that required hospitalization No Has patient had a PCN reaction occurring within the last 10 years: No If all of the above answers are "NO", then may proceed with Cephalosporin use.    OBJECTIVE: Vitals:   08/26/21 0926 08/26/21 2019 08/27/21 0531 08/27/21 0806  BP: (!) 139/93 131/83 129/83 133/90  Pulse: (!) 106 98 95 (!) 106  Resp: '16 17 17 17  '$ Temp: (!) 97.3 F (36.3 C) 98.1 F (36.7 C) 97.7 F (36.5 C) 97.9 F (36.6 C)  TempSrc: Oral Oral Oral   SpO2: 97% 98% 95% 95%  Weight:      Height:       Body mass index is 28.43 kg/m.  Physical Exam Constitutional:      Appearance: Normal appearance.  HENT:     Head: Normocephalic and atraumatic.     Right Ear: Tympanic membrane normal.     Left Ear: Tympanic membrane normal.     Nose: Nose normal.     Mouth/Throat:     Mouth: Mucous membranes are moist.  Eyes:     Extraocular Movements: Extraocular movements intact.      Conjunctiva/sclera: Conjunctivae normal.     Pupils: Pupils are equal, round, and reactive to light.  Cardiovascular:     Rate and Rhythm: Normal rate and regular rhythm.     Heart sounds: No murmur heard.    No friction rub. No gallop.  Pulmonary:     Effort: Pulmonary effort is normal.     Breath sounds: Normal breath sounds.  Abdominal:     General: Abdomen is flat.     Palpations: Abdomen is soft.     Comments: ABD drains in place  Musculoskeletal:        General: Normal range of motion.  Skin:    General: Skin is warm and dry.  Neurological:     General: No focal deficit present.     Mental Status: She is oriented to person, place, and time.  Psychiatric:        Mood and Affect: Mood normal.       Lab Results Lab Results  Component Value Date   WBC 7.0 08/27/2021   HGB 9.6 (L) 08/27/2021  HCT 29.3 (L) 08/27/2021   MCV 96.7 08/27/2021   PLT 98 (L) 08/27/2021    Lab Results  Component Value Date   CREATININE 1.71 (H) 08/27/2021   BUN 40 (H) 08/27/2021   NA 135 08/27/2021   K 4.3 08/27/2021   CL 107 08/27/2021   CO2 21 (L) 08/27/2021    Lab Results  Component Value Date   ALT 23 08/25/2021   AST 46 (H) 08/25/2021   ALKPHOS 593 (H) 08/25/2021   BILITOT 0.7 08/25/2021        Laurice Record, [place] for Infectious Disease [facility] 08/27/2021, 1:03 PM

## 2021-08-27 NOTE — Plan of Care (Signed)
  Problem: Nutrition: Goal: Adequate nutrition will be maintained Outcome: Progressing   Problem: Pain Managment: Goal: General experience of comfort will improve Outcome: Progressing   Problem: Safety: Goal: Ability to remain free from injury will improve Outcome: Progressing   

## 2021-08-27 NOTE — Progress Notes (Signed)
OT Cancellation Note  Patient Details Name: [name] MRN: [id number] DOB: 02/11/1946   Cancelled Treatment:    Reason Eval/Treat Not Completed: Pain limiting ability to participate Coordinating with RN for pain premedication for OT eval. Pt/family aware that OT will check back   Layla Maw 08/27/2021, 11:03 AM

## 2021-08-28 ENCOUNTER — Other Ambulatory Visit: Payer: Medicare HMO

## 2021-08-28 DIAGNOSIS — N179 Acute kidney failure, unspecified: Secondary | ICD-10-CM | POA: Diagnosis not present

## 2021-08-28 DIAGNOSIS — K819 Cholecystitis, unspecified: Secondary | ICD-10-CM | POA: Diagnosis not present

## 2021-08-28 DIAGNOSIS — E871 Hypo-osmolality and hyponatremia: Secondary | ICD-10-CM | POA: Diagnosis not present

## 2021-08-28 DIAGNOSIS — E039 Hypothyroidism, unspecified: Secondary | ICD-10-CM | POA: Diagnosis not present

## 2021-08-28 DIAGNOSIS — E860 Dehydration: Secondary | ICD-10-CM | POA: Diagnosis not present

## 2021-08-28 DIAGNOSIS — R601 Generalized edema: Secondary | ICD-10-CM

## 2021-08-28 LAB — CBC WITH DIFFERENTIAL/PLATELET
Abs Immature Granulocytes: 0.07 10*3/uL (ref 0.00–0.07)
Basophils Absolute: 0 10*3/uL (ref 0.0–0.1)
Basophils Relative: 0 %
Eosinophils Absolute: 0 10*3/uL (ref 0.0–0.5)
Eosinophils Relative: 0 %
HCT: 27.1 % — ABNORMAL LOW (ref 36.0–46.0)
Hemoglobin: 8.8 g/dL — ABNORMAL LOW (ref 12.0–15.0)
Immature Granulocytes: 1 %
Lymphocytes Relative: 10 %
Lymphs Abs: 0.8 10*3/uL (ref 0.7–4.0)
MCH: 31.5 pg (ref 26.0–34.0)
MCHC: 32.5 g/dL (ref 30.0–36.0)
MCV: 97.1 fL (ref 80.0–100.0)
Monocytes Absolute: 0.7 10*3/uL (ref 0.1–1.0)
Monocytes Relative: 8 %
Neutro Abs: 6.5 10*3/uL (ref 1.7–7.7)
Neutrophils Relative %: 81 %
Platelets: 98 10*3/uL — ABNORMAL LOW (ref 150–400)
RBC: 2.79 MIL/uL — ABNORMAL LOW (ref 3.87–5.11)
RDW: 19.1 % — ABNORMAL HIGH (ref 11.5–15.5)
WBC: 8.1 10*3/uL (ref 4.0–10.5)
nRBC: 0 % (ref 0.0–0.2)

## 2021-08-28 LAB — URINE CULTURE

## 2021-08-28 LAB — RENAL FUNCTION PANEL
Albumin: 2.5 g/dL — ABNORMAL LOW (ref 3.5–5.0)
Anion gap: 8 (ref 5–15)
BUN: 41 mg/dL — ABNORMAL HIGH (ref 8–23)
CO2: 20 mmol/L — ABNORMAL LOW (ref 22–32)
Calcium: 8.1 mg/dL — ABNORMAL LOW (ref 8.9–10.3)
Chloride: 106 mmol/L (ref 98–111)
Creatinine, Ser: 1.66 mg/dL — ABNORMAL HIGH (ref 0.44–1.00)
GFR, Estimated: 31 mL/min — ABNORMAL LOW (ref >60)
Glucose, Bld: 121 mg/dL — ABNORMAL HIGH (ref 70–99)
Phosphorus: 4 mg/dL (ref 2.5–4.6)
Potassium: 4.4 mmol/L (ref 3.5–5.1)
Sodium: 134 mmol/L — ABNORMAL LOW (ref 135–145)

## 2021-08-28 NOTE — Progress Notes (Signed)
Supervising Physician: Corrie Mckusick  Patient Status:  [facility] - In-pt  Chief Complaint:  S/p percutaneous cholecystotomy tube for cholecystitis  Subjective:  Weak, tired Laying in bed, husband at bedside No increased pain expressed Expects to go home on hospice, possibly Friday.  Oncology has terminated chemo for pancreatic cancer with metastatic disease to the liver, and has been deemed not a surgical candidate.  Allergies: Penicillins  Medications: Prior to Admission medications   Medication Sig Start Date End Date Taking? Authorizing Provider  ALPRAZolam (XANAX) 0.25 MG tablet Take 0.25 mg by mouth 2 (two) times daily as needed for anxiety. 08/02/21  Yes [provider]  amLODipine (NORVASC) 5 MG tablet Take 5 mg by mouth daily.   Yes [provider]  apixaban (ELIQUIS) 5 MG TABS tablet Take 5 mg by mouth 2 (two) times daily.   Yes [provider]  ceFEPIme (MAXIPIME) 2 g injection Inject 2 g into the muscle 2 (two) times daily. 08/20/21  Yes [provider]  cyanocobalamin (,VITAMIN B-12,) 1000 MCG/ML injection Inject 1 mL (1,000 mcg total) into the muscle every 30 (thirty) days. 01/11/21  Yes [name], MD  docusate sodium (COLACE) 100 MG capsule Take 100 mg by mouth 2 (two) times daily as needed (constipation).   Yes [provider]  famotidine (PEPCID) 20 MG tablet Take 1 tablet (20 mg total) by mouth daily. 08/02/21  Yes Hosie Poisson, MD  levothyroxine (SYNTHROID) 125 MCG tablet TAKE 1 TABLET ON AN EMPTY STOMACH IN THE MORNING Patient taking differently: Take 125 mcg by mouth daily before breakfast. 06/04/21  Yes [name], MD  lidocaine-prilocaine (EMLA) cream APPLY 1 APPLICATION TOPICALLY AS NEEDED. Patient taking differently: Apply 1 application. topically daily as needed (numbing). 07/04/21  Yes Ladell Pier, MD  omeprazole (PRILOSEC) 20 MG capsule TAKE 1 CAPSULE BY MOUTH EVERY DAY Patient taking differently: Take  20 mg by mouth at bedtime. 04/18/21  Yes [name], MD  oxyCODONE (OXY IR/ROXICODONE) 5 MG immediate release tablet Take 5 mg by mouth 3 (three) times daily as needed for moderate pain or severe pain. 08/17/21  Yes [provider]  oxyCODONE-acetaminophen (PERCOCET/ROXICET) 5-325 MG tablet Take 1-2 tablets by mouth every 6 (six) hours as needed for severe pain. 08/17/21  Yes Horton, Barbette Hair, MD  polyethylene glycol powder (GLYCOLAX/MIRALAX) 17 GM/SCOOP powder Take 17 g by mouth daily. Patient taking differently: Take 17 g by mouth daily as needed for moderate constipation. 08/02/21  Yes Hosie Poisson, MD  prochlorperazine (COMPAZINE) 10 MG tablet Take 1 tablet (10 mg total) by mouth every 6 (six) hours as needed (Nausea or vomiting). 09/12/20  Yes Truitt Merle, MD  ACCU-CHEK AVIVA PLUS test strip As needed 05/09/21   [name], MD  Accu-Chek Softclix Lancets lancets As needed 04/23/21   [name], MD  Alcohol Swabs PADS USE AS DIRECTED 05/18/21   [name], MD  apixaban (ELIQUIS) 5 MG TABS tablet Take 2 tablets (10 mg total) by mouth 2 (two) times daily for 1 day. 08/01/21 08/20/21  Hosie Poisson, MD  apixaban (ELIQUIS) 5 MG TABS tablet Take 2 tablets (10 mg total) by mouth 2 (two) times daily for 1 day. then Take 1 tablet (5 mg total) by mouth 2 (two) times daily. 08/02/21   Hosie Poisson, MD  Blood Glucose Monitoring Suppl (ACCU-CHEK GUIDE) w/Device KIT As needed 05/11/21   [name], MD  calcium carbonate (TUMS EX) 750 MG  chewable tablet Chew 1 tablet by mouth daily.    [provider]  LORazepam (ATIVAN) 0.5 MG tablet Take 0.5 mg by mouth as needed for anxiety.    [provider]  metroNIDAZOLE (FLAGYL) 500 MG tablet Take 500 mg by mouth 2 (two) times daily. Last dose will be 08/20/21    [provider]  Multiple Vitamin (MULTIVITAMIN WITH MINERALS) TABS tablet Take 1 tablet by mouth daily. 08/02/21   Hosie Poisson, MD  sodium chloride 0.9 % infusion  Inject into the vein. 08/13/21   [provider]  traMADol (ULTRAM) 50 MG tablet Take 1 tablet (50 mg total) by mouth every 6 (six) hours as needed for moderate pain or severe pain. Patient not taking: Reported on 08/09/2021 08/01/21   Hosie Poisson, MD     Vital Signs: BP (!) 128/100 (BP Location: Right Arm)   Pulse 93   Temp 98.1 F (36.7 C) (Oral)   Resp 16   Ht '5\' 3"'  (1.6 m)   Wt 160 lb 7.9 oz (72.8 kg)   SpO2 97%   BMI 28.43 kg/m   Physical Exam Vitals reviewed.  Constitutional:      General: She is awake.     Appearance: She is ill-appearing.  HENT:     Mouth/Throat:     Pharynx: Oropharynx is clear.  Eyes:     Extraocular Movements: Extraocular movements intact.  Cardiovascular:     Rate and Rhythm: Normal rate.  Pulmonary:     Effort: Pulmonary effort is normal.  Abdominal:     Palpations: Abdomen is soft.  Skin:    General: Skin is dry.     Coloration: Skin is pale.  Neurological:     General: No focal deficit present.  Psychiatric:        Mood and Affect: Mood normal.        Behavior: Behavior is cooperative.        Thought Content: Thought content normal.   Drain Location: RUQ Size: Fr size: 10 Fr Date of placement: 07/25/21  Currently to: Drain collection device: gravity 24 hour output:  Output by Drain (mL) 08/26/21 0700 - 08/26/21 1459 08/26/21 1500 - 08/26/21 2259 08/26/21 2300 - 08/27/21 0659 08/27/21 0700 - 08/27/21 1459 08/27/21 1500 - 08/27/21 2259 08/27/21 2300 - 08/28/21 0659 08/28/21 0700 - 08/28/21 1434  Closed System Drain RUQ 10.2 Fr.     100 70     Current examination: Flushes/aspirates easily.  Insertion site unremarkable. Suture and stat lock in place. Dressed appropriately.    Imaging: US RENAL  Result Date: 08/26/2021 CLINICAL DATA:  Renal dysfunction EXAM: RENAL / URINARY TRACT ULTRASOUND COMPLETE COMPARISON:  08/21/2021 FINDINGS: Right Kidney: Renal measurements: 10.6 x 5.1 x 5.9 cm = volume: 168 mL. There is no  hydronephrosis. There is increased cortical echogenicity. There is 3.5 cm cyst in the upper pole. There is 1.6 cm cyst in the midportion. Left Kidney: Renal measurements: 9.4 x 4.7 x 5.4 cm = volume: 124 mL. There is no hydronephrosis. There is increased cortical echogenicity. Bladder: Appears normal for degree of bladder distention. Other: Small ascites is seen. Minimal amount of perinephric fluid collection may be part of ascites. Spleen appears to be enlarged in size. There is possible right pleural effusion. IMPRESSION: There is no hydronephrosis. There is increased cortical echogenicity in both kidneys suggesting medical renal disease. Right renal cysts. Ascites.  Right pleural effusion. Electronically Signed   By: Elmer Picker M.D.   On: 08/26/2021  12:01    Labs:  CBC: Recent Labs    08/25/21 0159 08/26/21 0049 08/27/21 0450 08/28/21 0053  WBC 9.3 7.9 7.0 8.1  HGB 10.4* 10.5* 9.6* 8.8*  HCT 31.9* 32.7* 29.3* 27.1*  PLT 98* 109* 98* 98*    COAGS: Recent Labs    10/14/20 1457 06/30/21 0939 07/18/21 1503 07/19/21 0445 07/24/21 1306  INR 1.1 1.1 1.2 1.3* 1.2  APTT 45*  --  28  --   --     BMP: Recent Labs    08/25/21 0159 08/26/21 0049 08/27/21 0450 08/28/21 0053  NA 132* 135 135 134*  K 3.9 3.9 4.3 4.4  CL 105 105 107 106  CO2 23 22 21* 20*  GLUCOSE 154* 170* 151* 121*  BUN 40* 42* 40* 41*  CALCIUM 7.9* 7.8* 7.7* 8.1*  CREATININE 1.65* 1.72* 1.71* 1.66*  GFRNONAA 32* 30* 30* 31*    LIVER FUNCTION TESTS: Recent Labs    08/21/21 0317 08/22/21 0425 08/23/21 0456 08/24/21 0342 08/25/21 0159 08/27/21 0450 08/28/21 0053  BILITOT 0.6 0.6 1.6*  --  0.7  --   --   AST 34 36 205*  --  46*  --   --   ALT 13 14 35  --  23  --   --   ALKPHOS 340* 340* 637*  --  593*  --   --   PROT 5.3* 5.2* 5.3*  --  5.2*  --   --   ALBUMIN <1.5* <1.5* 2.6* 2.1* 1.9* 2.0* 2.5*    Assessment and Plan:  Cholecystitis --with percutaneous cholecystostomy, will likely stay  in place indefinitely as there is obstruction demonstrated at the cystic duct and she is not a surgical candidate. --unchanged 2.2cm perihepatic fluid collection of 6/5 CT re-evaluated today.  Clinically, doesn't seem to be affecting patient based on exam.  Will hold off on any intervention.  Should there be further concern, recommend reimaging  Continue TID flushes with 5 cc NS. Record output Q shift. Dressing changes QD or PRN if soiled.  Call IR APP or on call IR MD if difficulty flushing or sudden change in drain output.    Discharge planning: Please contact IR APP or on call IR MD prior to patient d/c to ensure appropriate follow up plans are in place. IR scheduler will contact patient with date/time of appointment. Patient will need to flush drain QD with 5 cc NS, record output QD, dressing changes every 2-3 days or earlier if soiled.   IR will continue to follow - please call with questions or concerns.  Electronically Signed: Pasty Spillers, PA 08/28/2021, 2:24 PM   I spent a total of 25 Minutes at the the patient's bedside AND on the patient's hospital floor or unit, greater than 50% of which was counseling/coordinating care for cholecystostomy management.

## 2021-08-28 NOTE — Progress Notes (Signed)
Patient known to general surgery service from Jul 20, 2021.  She has known metastatic pancreatic cancer with liver mets followed by Dr. Benay Spice.  She had cholecystitis in May and had a perc chole tube placed.  She has been readmitted secondary to weakness and poor PO tolerance.  She has been seen by ONC here and she is no longer a candidate for systemic chemotherapy due to poor status.  Recommendation is for referral to hospice pending improvement.  Her perc chole tube remains in place.  Her drain was evaluated on 5/31 with NO cystic duct patency c/w persistent need for perc chole drain to be in place.  Given her functional status and poor prognosis of her malignancy, she is not a candidate for surgical intervention for her gallbladder.  She will need to keep her drain at this point indefinitely, unless at some point her cystic duct becomes patent and the tube can be have a capping trial.  Will defer to further IR evaluation for this. No surgical intervention or role in this case.  [name] 9:06 AM 08/28/2021

## 2021-08-28 NOTE — Progress Notes (Addendum)
HEMATOLOGY-ONCOLOGY PROGRESS NOTE  ASSESSMENT AND PLAN: Adenocarcinoma the pancreas body, stage IV (cT4,cN0,pM1) Ultrasound abdomen 08/18/2020-possible hypoechoic pancreas body mass, small hypoechoic liver areas MRI abdomen 08/27/2020-pancreas body mass, multiple hepatic lesions consistent with metastases, right abdominal omental nodularity, pancreas mass extends to the celiac bifurcation and abuts the splenic vein and splenoportal confluence CT chest 09/07/2020-scattered pulmonary nodules concerning for metastases, pancreas body mass, hepatic metastases Ultrasound-guided biopsy of a right liver lesion 09/08/2020-adenocarcinoma consistent with a pancreas primary CT abdomen/pelvis 09/24/2020-pancreas neck mass, omental and peritoneal nodularity-unchanged, multiple hypoenhancing liver masses-unchanged, numerous small pulmonary nodules Cycle 1 gemcitabine/Abraxane 10/04/2020 Cycle 2 gemcitabine/Abraxane 10/27/2020 Cycle 3 gemcitabine/Abraxane 11/11/2018 Cycle 4 gemcitabine/Abraxane 11/23/2020 Cycle 5 gemcitabine/Abraxane 12/08/2020 CT abdomen/pelvis 12/18/2020-stable pancreas mass, stable and improved liver lesions, resolution of omental soft tissue density, stable indeterminate lung nodules, no disease progression Cycle 6 gemcitabine/Abraxane 12/22/2020 Cycle 7 gemcitabine 01/05/2021, 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  Cycle 8 gemcitabine/Abraxane 01/19/2021 Cycle 9 gemcitabine/Abraxane 02/01/2021 Cycle 10 Gemcitabine 02/16/2021, Abraxane held due to increased neuropathy Cycle 11 Gemcitabine 03/05/2021, 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 Cycle 12 gemcitabine 03/14/2021, 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 Cycle 13 gemcitabine 04/02/2021, 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 Cycle 14 gemcitabine/Abraxane 04/16/2021, Abraxane resumed at a reduced dose Cycle 15 gemcitabine/Abraxane 04/30/2021 CT abdomen/pelvis 05/08/2021-stable pancreas mass, liver metastases are slightly smaller, stable tiny bilateral lower lung nodules Cycle  16 gemcitabine/Abraxane 05/14/2021, Abraxane escalated back to 100 mg per metered squared Cycle 17 gemcitabine/Abraxane 05/28/2021 Cycle 18 gemcitabine/Abraxane 06/11/2021 Cycle 19 gemcitabine/Abraxane 07/09/2021 CT Abdo/pelvis 07/18/2021-diffuse gallbladder wall thickening and edema, small volume ascites, stable pulmonary nodules, indeterminate 15 x 18 mm right liver lesion, ill-defined hypodensity in the body of the pancreas 07/24/2021-right pleural fluid cytology-negative   2.  Pain secondary #1 3.  Diabetes 4.  Hypertension 5.  Osteoarthritis 6.  Port-A-Cath placement 09/22/2020 7.  Admission with jaundice 09/24/2020.  MRI-new abrupt constriction of the common hepatic duct.  The infiltrative pancreatic mass extends medially in the porta hepatis to obstruct the common hepatic duct.  Multiple right hepatic lobe metastases mildly increased in size.  Stent placed into the common bile duct 09/28/2020. 8.  COVID-19 06/20/2021, admission with COVID and bacterial pneumonia?  06/30/2021-completed course of antibiotics 9.  Hospital admission 07/18/2021-bacteremia, UTI, acute cholecystitis Cholecystostomy tube 07/20/2021 CT abdomen/pelvis 07/24/2021-moderate right pleural effusion, new subcapsular fluid collections adjacent to the right liver, interval cholecystostomy tube, stable pancreas body mass, incidental pulmonary embolism and a left lower lobe pulmonary artery Drain in perihepatic fluid collection 07/27/2021 10.  Pulmonary embolism/bilateral DVTs CT abdomen/pelvis 07/24/2021-incidental left lower lobe pulmonary embolism CT chest 07/24/2021-segmental and subsegmental bilateral pulmonary emboli Dopplers 07/24/2021-DVTs in the right common femoral, left common femoral, left profunda veins Heparin 07/24/2021, converted to apixaban on discharge 08/01/2021 11.  Bilateral foot numbness developing during hospital admission May 2023-Abraxane neuropathy? [name] admission 08/20/2021-dehydration, weakness  [name]  continues to be very fatigued and weak.  She continues to have poor p.o. intake.  We have again reviewed that she is not a candidate for systemic chemotherapy at this point.  Dr. Benay Spice reviewed enrolling with hospice and the patient this morning agreed to this but wanted to speak further with her family before making a final decision.  This afternoon, she did not want to engage in hospice discussion with me.  However, she did inquire about a "temporary" feeding tube.  We discussed that feeding tubes are generally a minor surgical procedure where the tube was inserted into the abdomen.  This would not be considered  a temporary measure.  She is aware of a feeding tube because her mother had 1 according to her her mother did well with feeding tube.  She would like to talk further about this with her primary oncologist.  We had some additional discussion today regarding discharge planning including SNF versus home with hospice.  The patient is not yet ready to make any decisions.  Recommend increasing ambulation as able.  Recommendations: 1.  Continue ongoing discussions regarding discharge planning and SNF versus hospice. 2.  Antibiotics per ID. 3.  Increase ambulation if tolerated.  [name]  [name] was interviewed and examined.  She was alert when I saw her this morning.  The anasarca appears partially improved.  She continues to have a poor performance status.  I recommend home hospice care.  I discussed this with [name] this morning.  She says she will discuss this with her family.  Her daughter was not present when I saw [name] this morning.  She will need skilled nursing facility placement if she does not enroll in hospice.  I was present for greater than 50% of today's visit.  I performed medical decision making.  SUBJECTIVE: [name] is laying in bed resting this afternoon.  Her husband is at the bedside.  She is able to converse with me today.  Overall, she  continues to be quite weak.  Still not eating very well.  She has asked today about a "temporary" feeding tube.  Oncology History Overview Note  Cancer Staging Primary pancreatic cancer with metastasis to other site [facility]) Staging form: Exocrine Pancreas, AJCC 8th Edition - Clinical stage from 09/08/2020: Stage IV (cT4, cN0, pM1) - Signed by Truitt Merle, MD on 09/12/2020 Stage prefix: Initial diagnosis Total positive nodes: 0    Primary pancreatic cancer with metastasis to other site ([place])  08/27/2020 Imaging   Abdominal MRI w wo contrast: IMPRESSION: 1. Pancreatic body infiltrative mass, favoring adenocarcinoma. 2. Multiple hepatic lesions, most consistent with metastasis. 3. Right abdominal omental nodularity, highly suspicious for peritoneal metastasis. 4. Recommend multidisciplinary oncology consultation for eventual sampling of either the liver or pancreatic lesions. 5. Vascular involvement with tumor, as detailed above.   09/07/2020 Imaging   CT chest  IMPRESSION: 1. Scattered small pulmonary nodules, worrisome for metastatic disease. 2. Pancreatic body mass and hepatic metastases, better seen and described on MR abdomen 08/27/2020. 3. Aortic atherosclerosis (ICD10-I70.0). Coronary artery calcification.   09/08/2020 Cancer Staging   Staging form: Exocrine Pancreas, AJCC 8th Edition - Clinical stage from 09/08/2020: Stage IV (cT4, cN0, pM1) - Signed by Truitt Merle, MD on 09/12/2020 Stage prefix: Initial diagnosis Total positive nodes: 0   09/08/2020 Pathology Results   A. LIVER, RIGHT, BIOPSY:  - Adenocarcinoma.  See comment.   COMMENT:   The morphology is consistent with a pancreatic primary.    09/12/2020 Initial Diagnosis   Primary pancreatic cancer with metastasis to other site [facility])   10/12/2020 -  Chemotherapy   Patient is on Treatment Plan : PANCREATIC Abraxane / Gemcitabine D1,8,15 q28d       PHYSICAL EXAMINATION:  Vitals:   08/27/21 2131 08/28/21 0436  BP:  (!) 145/100 (!) 149/83  Pulse: (!) 105 92  Resp: 18 20  Temp: 98.2 F (36.8 C) 97.9 F (36.6 C)  SpO2: 97% 94%   Filed Weights   08/20/21 2200  Weight: 72.8 kg    Intake/Output from previous day: 06/12 0701 - 06/13 0700 In: 120 [P.O.:120] Out: 770 [Urine:600; Drains:170]  Exam: Awake, conversant HEENT-no thrush Cardiac-regular rate and rhythm Lungs-clear anteriorly Abdomen-two right upper abdomen drains, drainage, no mass, mildly distended Vascular-pitting edema to the bilateral lower extremities and upper extremities, improved  LABORATORY DATA:  I have reviewed the data as listed    Latest Ref Rng & Units 08/28/2021   12:53 AM 08/27/2021    4:50 AM 08/26/2021   12:49 AM  CMP  Glucose 70 - 99 mg/dL 121  151  170   BUN 8 - 23 mg/dL 41  40  42   Creatinine 0.44 - 1.00 mg/dL 1.66  1.71  1.72   Sodium 135 - 145 mmol/L 134  135  135   Potassium 3.5 - 5.1 mmol/L 4.4  4.3  3.9   Chloride 98 - 111 mmol/L 106  107  105   CO2 22 - 32 mmol/L '20  21  22   '$ Calcium 8.9 - 10.3 mg/dL 8.1  7.7  7.8     Lab Results  Component Value Date   WBC 8.1 08/28/2021   HGB 8.8 (L) 08/28/2021   HCT 27.1 (L) 08/28/2021   MCV 97.1 08/28/2021   PLT 98 (L) 08/28/2021   NEUTROABS 6.5 08/28/2021    Lab Results  Component Value Date   CAN199 16,591 (H) 08/09/2021    US RENAL  Result Date: 08/26/2021 CLINICAL DATA:  Renal dysfunction EXAM: RENAL / URINARY TRACT ULTRASOUND COMPLETE COMPARISON:  08/21/2021 FINDINGS: Right Kidney: Renal measurements: 10.6 x 5.1 x 5.9 cm = volume: 168 mL. There is no hydronephrosis. There is increased cortical echogenicity. There is 3.5 cm cyst in the upper pole. There is 1.6 cm cyst in the midportion. Left Kidney: Renal measurements: 9.4 x 4.7 x 5.4 cm = volume: 124 mL. There is no hydronephrosis. There is increased cortical echogenicity. Bladder: Appears normal for degree of bladder distention. Other: Small ascites is seen. Minimal amount of perinephric fluid  collection may be part of ascites. Spleen appears to be enlarged in size. There is possible right pleural effusion. IMPRESSION: There is no hydronephrosis. There is increased cortical echogenicity in both kidneys suggesting medical renal disease. Right renal cysts. Ascites.  Right pleural effusion. Electronically Signed   By: Elmer Picker M.D.   On: 08/26/2021 12:01   EEG adult  Result Date: 08/23/2021 Lora Havens, MD     08/23/2021  8:07 PM Patient Name: ADRAIN BUTRICK MRN: [id number] Epilepsy Attending: Lora Havens Referring Physician/Provider: Eugenie Filler, MD Date: 08/23/2021 Duration: 28.24 mins Patient history: 76yo F with ams and continuous jerking movements of both arms. EEG to evaluate for seizure. Level of alertness: lethargic AEDs during EEG study: None Technical aspects: This EEG study was done with scalp electrodes positioned according to the 10-20 International system of electrode placement. Electrical activity was acquired at a sampling rate of '500Hz'$  and reviewed with a high frequency filter of '70Hz'$  and a low frequency filter of '1Hz'$ . EEG data were recorded continuously and digitally stored. Description: No clear posterior dominant rhythm was seen. EEG showed continuous generalized polymorphic 3-'5Hz'$  theta-delta slowing. Generalized periodic discharges with triphasic morphology at 1-1.'5Hz'$  were also noted, more prominent when awake/stimulated. Hyperventilation and photic stimulation were not performed.    ABNORMALITY - Periodic discharges with triphasic morphology, generalized ( GPDs) - Continuous slow, generalized  IMPRESSION: This study showed generalized periodic discharges with triphasic morphology. This EEG pattern is on the ictal-interictal continuum. However the morphology, frequency and reactivity to stimulation is more commonly indicative of toxic-metabolic causes  like hyperammonemia, cefepime toxicity. Additionally there is moderate to severe diffuse encephalopathy. No  seizures were seen throughout the recording.  If patient remains altered, can consider prolonged EEG monitoring.  Priyanka Barbra Sarks    CT HEAD WO CONTRAST (5MM)  Result Date: 08/23/2021 CLINICAL DATA:  Altered mental status EXAM: CT HEAD WITHOUT CONTRAST TECHNIQUE: Contiguous axial images were obtained from the base of the skull through the vertex without intravenous contrast. RADIATION DOSE REDUCTION: This exam was performed according to the departmental dose-optimization program which includes automated exposure control, adjustment of the mA and/or kV according to patient size and/or use of iterative reconstruction technique. COMPARISON:  06/01/2016 FINDINGS: Brain: No acute intracranial findings are seen in noncontrast CT brain. There are no signs of bleeding. Cortical sulci are prominent. There is decreased density in the periventricular white matter. Vascular: Unremarkable. Skull: Unremarkable. Sinuses/Orbits: Unremarkable. Other: No significant interval changes are noted. IMPRESSION: No acute intracranial findings are seen in noncontrast CT brain. Atrophy. Small-vessel disease. Electronically Signed   By: Elmer Picker M.D.   On: 08/23/2021 13:11   CT ABDOMEN PELVIS W CONTRAST  Result Date: 08/21/2021 CLINICAL DATA:  Cholecystitis status post cholecystostomy tube placement. History of pancreatic cancer. EXAM: CT ABDOMEN AND PELVIS WITH CONTRAST TECHNIQUE: Multidetector CT imaging of the abdomen and pelvis was performed using the standard protocol following bolus administration of intravenous contrast. RADIATION DOSE REDUCTION: This exam was performed according to the departmental dose-optimization program which includes automated exposure control, adjustment of the mA and/or kV according to patient size and/or use of iterative reconstruction technique. CONTRAST:  111m OMNIPAQUE IOHEXOL 300 MG/ML  SOLN COMPARISON:  CT abdomen pelvis dated August 16, 2021. FINDINGS: Lower chest: Unchanged moderate right  and small left pleural effusions with adjacent atelectasis in both posterior lower lobes. Hepatobiliary: Multiple hypodense liver lesions are unchanged. Right perihepatic drainage catheter remains in place without significant residual perihepatic fluid collection. 2.2 cm posteroinferior perihepatic fluid collection is unchanged (series 3, image 20). New small amount of pneumobilia in the left liver. Multiple gallstones again noted with slightly increased distention of the gallbladder status post cholecystostomy tube placement. Unchanged common bile duct stent. Pancreas: Unchanged hypodense mass in the pancreatic neck with atrophy of the body and tail. Spleen: Unchanged wedge-shaped hypodensity in the inferior spleen, likely a small infarct. Normal in size. Adrenals/Urinary Tract: Adrenal glands are unremarkable. Unchanged small right renal simple cysts. No follow-up imaging is recommended. No renal calculi or hydronephrosis. The bladder is distended with excreted contrast. Stomach/Bowel: Unchanged small hiatal hernia. The stomach is otherwise within normal limits. No bowel wall thickening, distention, or surrounding inflammatory changes. Scattered mild colonic diverticulosis. Similar moderate stool burden throughout the colon. Normal appendix. 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 Aortoiliac atherosclerotic vascular disease. No enlarged abdominal or pelvic lymph nodes. Reproductive: Status post hysterectomy. No adnexal masses. Other: Decreased small volume ascites in the pelvis. No pneumoperitoneum. Musculoskeletal: No acute or significant osseous findings. Unchanged anasarca. IMPRESSION: 1. Status post cholecystostomy tube placement with slightly increased distention of the gallbladder compared to prior study. Correlate with tube function. 2. Right perihepatic drainage catheter  remains in place without significant residual perihepatic fluid collection around the drain. Unchanged 2.2 cm posteroinferior perihepatic fluid collection separate from the drain. 3. 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 4. Decreased small volume ascites in the pelvis. 5. Unchanged moderate right and small left pleural effusions. 6. 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 7. Aortic Atherosclerosis (ICD10-I70.0). Electronically Signed   By: Titus Dubin M.D.   On: 08/21/2021 17:58   US RENAL  Result Date: 08/21/2021 CLINICAL DATA:  Acute kidney injury EXAM: RENAL / URINARY TRACT ULTRASOUND COMPLETE COMPARISON:  CT abdomen 08/16/2021 FINDINGS: Right Kidney: Renal measurements: 9.5 by 4.0 by 5.0 cm = volume: 102 mL. Mildly hyperechoic relative to the liver. No solid mass or hydronephrosis visualized. A right kidney upper pole cyst measures 2.0 by 3.0 by 2 2.6 cm and appears simple. Left Kidney: Renal measurements: 9.7 by 4.0 by 4.8 cm = volume: 99 mL. Mildly hyperechoic relative to the spleen. No mass or hydronephrosis visualized. Bladder: Appears normal for degree of bladder distention. A left ureteral jet is visualized. Ascites in the lower abdomen. IMPRESSION: 1. Bilateral renal parenchymal hyperechogenicity suggesting chronic medical renal disease. 2. Right kidney upper pole simple appearing cyst. 3. A left ureteral jet is visualized in the urinary bladder. Right ureteral jet not seen, although this may simply be incidental. 4. Pelvic ascites. Electronically Signed   By: Van Clines M.D.   On: 08/21/2021 16:00   DG CHEST PORT 1 VIEW  Result Date: 08/20/2021 CLINICAL DATA:  Status post PICC line placement. EXAM: PORTABLE CHEST 1 VIEW COMPARISON:  07/27/2021. FINDINGS: The heart size and mediastinal contours are within normal limits. There is atherosclerotic  calcification of the aorta. Patchy airspace disease is noted at the right lung base. There are questionable small bilateral pleural effusions. No pneumothorax. A right chest port is stable. No PICC line is identified. A stable pigtail catheter is noted in the right upper quadrant. No acute osseous abnormality. IMPRESSION: 1. Small bilateral pleural effusions with atelectasis at the lung bases. 2. No PICC line is identified.  Clinical correlation is recommended. Electronically Signed   By: Brett Fairy M.D.   On: 08/20/2021 23:45   CT ABDOMEN PELVIS W CONTRAST  Result Date: 08/17/2021 CLINICAL DATA:  Abdominal pain, acute, nonlocalized EXAM: CT ABDOMEN AND PELVIS WITH CONTRAST TECHNIQUE: Multidetector CT imaging of the abdomen and pelvis was performed using the standard protocol following bolus administration of intravenous contrast. RADIATION DOSE REDUCTION: This exam was performed according to the departmental dose-optimization program which includes automated exposure control, adjustment of the mA and/or kV according to patient size and/or use of iterative reconstruction technique. CONTRAST:  60m OMNIPAQUE IOHEXOL 300 MG/ML  SOLN COMPARISON:  08/15/2021 FINDINGS: Lower chest: Small bilateral pleural effusions. Compressive atelectasis in the lower lobes. Findings stable since prior study. Hepatobiliary: Right perihepatic drainage catheter remains in place with near complete resolution of perihepatic fluid collection. Inferior perihepatic fluid collection again noted, measuring 2.3 cm, stable. Numerous hypodensities throughout the liver are again seen and unchanged compatible with metastases. Cholecystostomy tube in place. Gallbladder decompressed. Gallstone noted within the gallbladder. Common bile duct stent noted, unchanged. Pancreas: Ill-defined hypodensity in the body of the pancreas compatible with known pancreatic malignancy. Pancreatic tail atrophy. Spleen: No focal abnormality.  Normal size.  Adrenals/Urinary Tract: Right upper pole renal cyst. No hydronephrosis. Adrenal glands and urinary bladder unremarkable. Stomach/Bowel: Large stool burden in the colon. Sigmoid diverticulosis. Stomach and small bowel decompressed. Vascular/Lymphatic: Aortoiliac atherosclerosis. No adenopathy or aneurysm. Reproductive: Prior hysterectomy.  No adnexal masses. Other: Moderate free fluid in the pelvis, unchanged. Musculoskeletal: No acute bony abnormality. IMPRESSION: Essentially stable CT since 2 days prior. Small bilateral pleural effusions with compressive  atelectasis. Numerous metastases within the liver, stable. Perihepatic fluid collections are stable. Drainage catheter within a right perihepatic fluid collection and in the gallbladder. Pancreatic body mass compatible with known pancreatic malignancy. Pancreatic tail atrophy. Large stool burden in the colon. Aortic atherosclerosis. Moderate free fluid in the pelvis. Electronically Signed   By: Rolm Baptise M.D.   On: 08/17/2021 02:22   CT ABDOMEN PELVIS W CONTRAST  Result Date: 08/15/2021 CLINICAL DATA:  Briefly, 76 year old female with a history of pancreatic tumor, perihepatic fluid collection and acute cholecystitis s/p perihepatic drain and cholecystostomy drain placement. EXAM: CT ABDOMEN AND PELVIS WITH CONTRAST TECHNIQUE: Multidetector CT imaging of the abdomen and pelvis was performed using the standard protocol following bolus administration of intravenous contrast. RADIATION DOSE REDUCTION: This exam was performed according to the departmental dose-optimization program which includes automated exposure control, adjustment of the mA and/or kV according to patient size and/or use of iterative reconstruction technique. CONTRAST:  182m ISOVUE-300 IOPAMIDOL (ISOVUE-300) INJECTION 61% COMPARISON:  IR fluoroscopy, earlier same day.  CT AP, 07/24/2021. FINDINGS: Lower chest: Persistent small volume bilateral pleural effusions, RIGHT-greater-than LEFT, and  adjacent dependent consolidations Hepatobiliary: *Well-positioned percutaneous drainage catheter with near-complete resolution of previously large RIGHT perihepatic fluid collection. *Trace residual collection along the hepatic dome measures up to 5.0 x 1.0 cm, previously 17 cm. *Additional inferomedial perihepatic fluid collection measures 3.0 x 1.7 cm, previously 6 cm *Multifocal hypodense lesions within the LEFT and RIGHT hepatic lobes, largest at hepatic segment VI measuring 3.5 x 2.5 cm, suspicious for hepatic metastases. *Stable positioning of cholecystostomy drain with contracted gallbladder. Metallic biliary stent. No intra or extrahepatic biliary ductal dilation. Pancreas: Normal contrast enhancement of the pancreatic head. Similar ill-defined hypodensity within the body of pancreas and pancreatic tail atrophy. Findings compatible with known pancreatic malignancy Spleen: New area of hypodensity within the inferior splenic pole, in an area measuring 3.0 x 2.0 cm. Adrenals/Urinary Tract: Adrenal glands are unremarkable. RIGHT exophytic and renal cortical renal cysts, unchanged. The LEFT kidney is normal. No renal calculi or hydronephrosis. Bladder is unremarkable. Stomach/Bowel: Small hiatus hernia. Nonobstructed bowel. Severe burden of sigmoid diverticulosis. No evidence of bowel wall thickening, distention, or inflammatory changes. Vascular/Lymphatic: Aortic atherosclerosis without aneurysmal dilation. No enlarged abdominal or pelvic lymph nodes. Reproductive: Status post hysterectomy. No adnexal mass. Other: Small volume of residual ascites within the dependent pelvis. Musculoskeletal: Mild body wall edema. Multilevel spinal degenerative change. No interval osseous abnormality. IMPRESSION: Since CT AP dated 07/24/2021; 1. Well-positioned percutaneous drainage catheter with near-complete resolution of previously large perihepatic free fluid collection. Small volume residual inferomedial hepatic fluid  collection. 2. Stable positioning of cholecystostomy drain with contracted gallbladder. 3. Metallic biliary stent without intra- or extrahepatic biliary ductal dilation. 4. Pancreatic body mass consistent with known malignancy. Increased conspicuity of multifocal hepatic lesions, as above, is suspicious for metastases. 5. New hypodensity within the inferior splenic pole, suspicious for regional hypoperfusion and splenic infarct. 6. Bilateral small volume pleural effusions, greater on RIGHT. Additional incidental, chronic and senescent findings as above. These results will be called to the ordering clinician or representative by the Radiologist Assistant, and communication documented in the PACS or [employer] Electronically Signed   By: JMichaelle BirksM.D.   On: 08/15/2021 17:41   DG Sinus/Fist Tube Chk-Non GI  Result Date: 08/15/2021 CLINICAL DATA:  Briefly, 76year old female with a history of pancreatic tumor, perihepatic fluid collection and acute cholecystitis s/p perihepatic drain and cholecystostomy drain placement. EXAM: CHOLECYSTOSTOMY DRAIN INJECTION COMPARISON:  CT  AP, concurrent and 07/24/2021.  IR CT, 07/25/2021. CONTRAST:  40 mL Omnipaque 300-administered via the existing cholecystostomy drain. FLUOROSCOPY TIME:  Fluoroscopic dose; 11.3 mGy TECHNIQUE: The patient was positioned supine on the fluoroscopy table. A preprocedural spot fluoroscopic image was obtained of the RIGHT upper quadrant and the existing percutaneous cholecystostomy drainage catheter. Multiple spot fluoroscopic and radiographic images were obtained following the injection of a small amount of contrast via the existing percutaneous cholecystostomy catheter. A contrast injection for the perihepatic drainage catheter was not performed. Procedure performed by Pasty Spillers, IR PA FINDINGS: Cholecystostomy drain injection without evidence of cystic duct patency. Metallic biliary stent. IMPRESSION: No fluoroscopic evidence of  cystic duct patency. The cholecystostomy drain was therefore LEFT in place. Electronically Signed   By: Michaelle Birks M.D.   On: 08/15/2021 16:41   IR Radiologist Eval & Mgmt  Result Date: 08/15/2021 Please refer to notes tab for details about interventional procedure. (Op Note)    Future Appointments  Date Time Provider [place]  09/05/2021 10:00 AM Rosiland Oz, MD [facility]  10/17/2021  3:00 PM [name], MD [facility]  12/10/2021 11:45 AM [facility] HEALTH COACH [facility]      LOS: 8 days

## 2021-08-28 NOTE — Progress Notes (Signed)
OT Cancellation Note  Patient Details Name: [name] MRN: [id number] DOB: 1945/06/29   Cancelled Treatment:    Reason Eval/Treat Not Completed: Patient declined, no reason specified. OT ordered 6/7 with multiple attempts to evaluate. Pt continues to decline. Pt educated this session on the purpose of OT with order from MD staff. Pt politely declines and reports 1pm is her best time of the day for potential participation.   Jeri Modena 08/28/2021, 4:06 PM

## 2021-08-28 NOTE — Progress Notes (Signed)
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 ([place])   Weakness   Decubitus skin ulcer   Acute metabolic encephalopathy   Cholecystitis   VTE (venous thromboembolism)   AKI (acute kidney injury) ([place])   Protein-calorie malnutrition, severe ([place])   Palliative care by specialist   Oral thrush   Acute urinary retention          Assessment: 76 YF direct admitted due to N/V and decreased PO intake   # Dehydration # Weakness 2/2 decrease PO intake  # Hx of Subcapsular fluid collection adjacent to the liver(17.6 cm) with Cx NG #Small perihepatic fluid collection(2.2cm)-separate from drain # Hx of Acute cholecystitis SP cholecystostomy  with IR on 5/5 with Cx with Morganella morganii/B ovatus and E coli  # Hx of  Morganella morganii and E coli  bacteremia on 5/3  #Adenocarcinoma of pancrease, 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 SP CT guided drainage of perihepatic fluid 5/10. Cx no growth. She was discharged on vanc and cefepime with EOT 08/20/21 -CT on 6/2 showed near complete resolution of previous large perihepatic free fluid collection. Small volume residual inferomedial hepatic fluid collection, measuring 2.3 cm,stable. . Plan was for capping trial and 2 week follow-up with IR.  -She presented ot ED on 6/1 with abdominal pain. CT(6/6) showed rt perihepatic drainage catheter in place with near complete resolution of perihepatic fluid collection. Slightly increased distension of gallbladder. Numerous liver mets. Unchanged 2.2  cm posteroinferior perihepatic fluid collection separate from the drain.  -Seen by ID on 6/5, and found to have PO intolerance. She was direct admitted for IVF(did not want to fo to ED). She noted abdominal pain is getting better. -Pt had worsening mental status on 6/8, noted to received remaron/xanax. Neurology consulted and concerned with cefepime toxicity. Cefepime switched to ciprofloxacin. Pallative care on board, pending weekend progressio, may place hospice referral -IR consulted and perihepatic drain removed on 6/9, perc choli drain remains. -Oncology following   Recommendations:  -Transitioned ciprofloxacin and  metronidazole to PO as pt is tolerating solids. Antibiotic duration, pending clinical and radiographic progression. She has recieved about 5 weeks of antibiotics from hepatic drain placement(removed) -Engage IR to see if able to aspirate 2.2 cm fluid collection as it is unchanged.  -Seen by general surgery(6/13) per distended gallbladder, no plans for OR. Perc choli drain needs to stay in place indefinitely unless cystic duct becomes patent.  -ID appt with Manandhar on 6/21 -Follow goals of care  #Urine Cx+ E faecium -Represents asymptomatic bacteruria- pt has no urinary complaints. Repeat urine Cx on 6/11 show multiple species.    Microbiology:   Antibiotics: Vancomycin 5/3 Cefepime 5/3, 5/6-p Aztreonam 5/3, Ceftriaxone 5/4-5/6 Metronidazole 5/3-p     Cultures:   Prior hospitalization: 5/3 blood Cx+ 2/2 Morgenella Morgani and 1/2 Ecoli 5/5 GB abscess Cx+ Ecoli, morganella morganii and bacteroides ovatus(beta lactamase positive) 5/10liver abscess perihepatic collection with NG  SUBJECTIVE: Resting in bed, She reports she was able to tolerate metronidazole pO last night. Interval: afebrile overnight.   Review  of Systems: Review of Systems  All other systems reviewed and are negative.    Scheduled Meds:  amLODipine  5 mg Oral Daily   apixaban  5 mg Oral BID    bisacodyl  10 mg Rectal Once   ciprofloxacin  500 mg Oral Q breakfast   dronabinol  5 mg Oral BID AC   levothyroxine  125 mcg Oral QAC breakfast   metroNIDAZOLE  500 mg Oral Q12H   multivitamin with minerals  1 tablet Oral Daily   nystatin  5 mL Oral QID   pantoprazole  40 mg Oral Daily   polyethylene glycol  17 g Oral Daily   sodium chloride flush  5 mL Intracatheter Daily   sodium chloride flush  5 mL Intracatheter Daily   [START ON 09/01/2021] thiamine injection  100 mg Intravenous Daily   Continuous Infusions:  albumin human 25 g (08/28/21 1002)   thiamine injection 250 mg (08/27/21 1256)   PRN Meds:.ALPRAZolam, docusate sodium, ibuprofen, iohexol, oxyCODONE, prochlorperazine, sodium chloride flush Allergies  Allergen Reactions   Penicillins Hives and Other (See Comments)    Tolerated Ceftriaxone 2023  Has patient had a PCN reaction causing immediate rash, facial/tongue/throat swelling, SOB or lightheadedness with hypotension: No Has patient had a PCN reaction causing severe rash involving mucus membranes or skin necrosis: No Has patient had a PCN reaction that required hospitalization No Has patient had a PCN reaction occurring within the last 10 years: No If all of the above answers are "NO", then may proceed with Cephalosporin use.    OBJECTIVE: Vitals:   08/27/21 0806 08/27/21 2131 08/28/21 0436 08/28/21 0848  BP: 133/90 (!) 145/100 (!) 149/83 (!) 128/100  Pulse: (!) 106 (!) 105 92 93  Resp: '17 18 20 16  '$ Temp: 97.9 F (36.6 C) 98.2 F (36.8 C) 97.9 F (36.6 C) 98.1 F (36.7 C)  TempSrc:  Oral Oral Oral  SpO2: 95% 97% 94% 97%  Weight:      Height:       Body mass index is 28.43 kg/m.  Physical Exam Constitutional:      Appearance: Normal appearance.  HENT:     Head: Normocephalic and atraumatic.     Right Ear: Tympanic membrane normal.     Left Ear: Tympanic membrane normal.     Nose: Nose normal.     Mouth/Throat:     Mouth: Mucous membranes are  moist.  Eyes:     Extraocular Movements: Extraocular movements intact.     Conjunctiva/sclera: Conjunctivae normal.     Pupils: Pupils are equal, round, and reactive to light.  Cardiovascular:     Rate and Rhythm: Normal rate and regular rhythm.     Heart sounds: No murmur heard.    No friction rub. No gallop.  Pulmonary:     Effort: Pulmonary effort is normal.     Breath sounds: Normal breath sounds.  Abdominal:     General: Abdomen is flat.     Palpations: Abdomen is soft.     Comments: ABD drains in place  Musculoskeletal:        General: Normal range of motion.  Skin:    General: Skin is warm and dry.  Neurological:     General: No focal deficit present.     Mental Status: She is oriented to person, place, and time.  Psychiatric:        Mood and Affect: Mood normal.       Lab Results Lab Results  Component  Value Date   WBC 8.1 08/28/2021   HGB 8.8 (L) 08/28/2021   HCT 27.1 (L) 08/28/2021   MCV 97.1 08/28/2021   PLT 98 (L) 08/28/2021    Lab Results  Component Value Date   CREATININE 1.66 (H) 08/28/2021   BUN 41 (H) 08/28/2021   NA 134 (L) 08/28/2021   K 4.4 08/28/2021   CL 106 08/28/2021   CO2 20 (L) 08/28/2021    Lab Results  Component Value Date   ALT 23 08/25/2021   AST 46 (H) 08/25/2021   ALKPHOS 593 (H) 08/25/2021   BILITOT 0.7 08/25/2021        Laurice Record, [facility] 08/28/2021, 11:25 AM

## 2021-08-28 NOTE — Assessment & Plan Note (Addendum)
-   Secondary to malnutrition/hypoalbuminemia. -IV fluids-saline lock. -DC IV albumin -Anasarca improved.

## 2021-08-28 NOTE — Progress Notes (Signed)
PROGRESS NOTE    [name]  XFG:[id number] DOB: 07-08-1945 DOA: 08/20/2021 PCP: [name], MD    No chief complaint on file.   Brief Narrative:  [name] is a 76 y.o. female with medical history significant for acute cholecystitis status post cholecystostomy, history of pancreatic cancer, hypothyroidism, hypertension, generalized anxiety disorder, history of venous thromboembolic disease, recent hospitalization for sepsis with Morganella morganii/E. coli bacteremia who was sent to the hospital as a direct admission for evaluation of poor oral intake and severe dehydration. Patient's daughter states that since her discharge her oral intake has been very poor but over the last several days she has not had anything to eat or drink due to persistent nausea and vomiting despite taking antiemetics as well as poor appetite.  She complains of feeling very weak and tired and has been unable to get out of bed for the last 2 days.  She feels her legs are very heavy and unable to bear her weight.  She denies having any falls. She complains of abdominal pain mostly in the left lumbar area which she rates a 5 x 10 in intensity at its worst.  But denies having any changes in her bowel habits.  She denies having any fever, no chills, no urinary symptoms, no dizziness, no lightheadedness, no chest pain, no shortness of breath, no headache, no blurred vision or focal deficit. Her daughter is concerned about her progressive decline and inability to participate in physical therapy due to weakness.    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)   VTE (venous  thromboembolism)   Protein-calorie malnutrition, severe ([place])   Palliative care by specialist   Oral thrush   Acute urinary retention   Anasarca    Assessment and Plan: * Dehydration with hyponatremia -IV fluids held initially due to concern for mild swelling early on in the hospitalization however patient placed back on IV fluids due to poor oral intake, dehydration and worsening renal function..   -Encourage oral intake.   -Patient started to develop some volume overload with some lower extremity edema likely secondary to third spacing. -Saline lock IV fluids. -Follow.  Cholecystitis Status post cholecystostomy tube insertion In addition, had perihepatic drain placed Saw IR on 5/31 and plan for capping trial with return for imaging in 2 weeks Her symptoms seem to have worsened after this trial, unclear if this is related? CT 08/16/21 with stable CT, small bilateral effusions, numerous mets within liver, drainage catheter within right perihepatic fluid collection and in the gallbladder, pancreatic body mass c/w known pancreatic malignancy, large stool burden IR consulted to see about reattaching bags. With E. coli and Morganella morganii bacteremia Was on antibiotic therapy with cefepime and Flagyl. Patient with acute metabolic encephalopathy on 08/23/2021 and noted to be pocketing pills and also refusing some of her medications and as such oral Flagyl changed to IV Flagyl.  -Also due to concerns that cefepime may be contributing to metabolic encephalopathy cefepime changed to IV ciprofloxacin per ID.   -Patient transition to oral ciprofloxacin and oral Flagyl per ID. ID following and appreciate input and recommendations. -General surgery waiting on note that patient no longer a  candidate for systemic chemotherapy due to poor status, due to patient's functional status, poor prognosis per general surgery not a candidate for surgical intervention for the gallbladder and will need to keep  draining at this point indefinitely.  General surgery deferred to IR for further management.  Acute metabolic encephalopathy - Patient noted to be drowsy, confusion, noted to have some myoclonic jerking on 08/23/2021.   -Likely multifactorial secondary to medications including narcotics, benzos in the setting of AKI. -Patient noted to have received Xanax and Remeron at the same time the evening of 08/22/2021, as well as oxycodone 1 to 2 hours prior to that. -Head CT done unremarkable.   -EEG done was negative for seizures.  -Ammonia level within normal limits. -Discontinued Remeron and decreased oxycodone to 1 tablet every 6 hours as needed. -Vitamin B1 level pending. -Cefepime discontinued. -Patient started on high-dose IV thiamine per neurology recommendations. -Patient with clinical improvement, alert, following commands appropriately. -Neurology consulted, was following but have signed off. -Supportive care.  AKI (acute kidney injury) ([place]) -IV fluids held due to mild swelling noted.   -Swelling likely secondary to severe hypoalbuminemia.   Right hand with ring stuck on ring finger, Dr. Florene Glen discussed with RN to help with removal. -Ring has been removed. Renal US done early on during the hospitalization-> without hydro -renal function was initially improving however trending back up likely secondary to poor oral intake.  -Urinalysis ordered however patient refused and not done. -Renal function trending up and trending back down. -Urine sodium noted at 14. -Bladder scan done with about 1200 cc of urine noted, I and O cath none on 08/26/2021 with 900 cc of urine noted.   -Every 6 bladder scan, I and o every 6 hours as needed.  -Renal ultrasound done negative for hydronephrosis.   -Patient starting to become volume overloaded and as such we will saline lock IV fluids.   -Patient placed on IV albumin twice daily.   -Renal function seems to be trending back down.   -Avoid nephrotoxins.    -Follow.  Weakness Secondary to poor oral intake as well as multiple comorbid conditions. Possibly related to capping trial? Follow symptoms after this. -Gentle hydration. PT/OT evals  Diet-controlled diabetes mellitus ([place]) 6.3 a1c 06/2021 -Blood glucose level of 119 this morning on lab work.   -Bicarb drip has been discontinued -Saline lock IV fluids. -Supportive care.    Essential hypertension -Controlled on current regimen of amlodipine.   -Patient with altered mental status drowsy with poor oral intake and pocketing medications early on in the hospitalization which has since improved.. -Mental status improved.  -Amlodipine resumed.   -Follow.  Acquired hypothyroidism Stable. -Synthroid. Outpatient follow-up.  Decubitus skin ulcer     Anasarca - Secondary to malnutrition/hypoalbuminemia. -IV fluids-saline lock. -On IV albumin. -May consider a dose of IV Lasix however will monitor for now.  Acute urinary retention - Bladder scan done 08/26/2021 with 1200 cc of urine noted per RN. -I and O cath done on 08/26/2021 with 900 cc of urine output. -Serial bladder scans, I and O cath every 6 hours as needed. -Foley catheter placed due to urinary retention.  Oral thrush - Patient with some concerns for oral thrush. -Continue nystatin swish and swallow.  Protein-calorie malnutrition, severe ([place]) - Patient with severe protein calorie malnutrition with albumin level < 1.5. -Likely secondary to metastatic pancreatic cancer in the setting of poor oral intake secondary to nausea. -Placed on scheduled Zofran 3 times daily x 24 hours. -Status  post IV albumin every 6 hours x24 hours. -Encourage oral intake. -Due to acute metabolic encephalopathy with drowsiness and lethargy with some myoclonic jerks Remeron discontinued.  -Patient seen by speech therapist and patient placed on a regular diet with thin liquids.  -Marinol ordered for appetite stimulant per palliative care  however noted to be refusing early on in the hospitalization, however seems to be taking it. -IV albumin twice daily.  -Encourage oral intake.  -Supportive care.  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 Benay Spice during this hospitalization. -Hospice recommended per oncology if patient does not start to improve soon.         DVT prophylaxis: Eliquis Code Status: DNR Family Communication: Updated husband at bedside.  Disposition: Likely home with home health therapies  Status is: Inpatient Remains inpatient appropriate because: Severity of illness   Consultants:  Interventional radiology Infectious disease: Dr. Candiss Norse 08/21/2021 Palliative care: Dr. Rowe Pavy 08/21/2021 Oncology informed via epic of admission. Neurology: Dr. Leonel Ramsay 08/23/2021  Procedures:  CT abdomen and pelvis 08/21/2021 Chest x-ray 08/20/2021 Renal ultrasound 08/21/2021, 08/26/2021 CT head 08/23/2021 EEG 08/23/2021 CT head 08/23/2021    Antimicrobials:  Oral Flagyl 08/20/2021>>>> IV Flagyl 08/23/2021>>>> IV cefepime 08/21/2021>>>> 08/23/2021 IV ciprofloxacin 08/23/2021>>> 08/27/2021 Oral ciprofloxacin 08/28/2021>>>>    Subjective: Alert.  Sitting up in bed.  Husband at bedside.  No chest pain.  No shortness of breath.  No abdominal pain.  Foley catheter in place as patient noted to have urinary retention overnight.    .  Objective: Vitals:   08/27/21 0806 08/27/21 2131 08/28/21 0436 08/28/21 0848  BP: 133/90 (!) 145/100 (!) 149/83 (!) 128/100  Pulse: (!) 106 (!) 105 92 93  Resp: '17 18 20 16  '$ Temp: 97.9 F (36.6 C) 98.2 F (36.8 C) 97.9 F (36.6 C) 98.1 F (36.7 C)  TempSrc:  Oral Oral Oral  SpO2: 95% 97% 94% 97%  Weight:       Height:        Intake/Output Summary (Last 24 hours) at 08/28/2021 1805 Last data filed at 08/28/2021 0600 Gross per 24 hour  Intake --  Output 170 ml  Net -170 ml   Filed Weights   08/20/21 2200  Weight: 72.8 kg    Examination:  General exam: Alert.  Dry mucous membranes.  Oral thrush noted. Respiratory system: Bibasilar crackles.  No wheezing.  No rhonchi.  Fair air movement.  Speaking in full sentences.  Cardiovascular system: Regular rate rhythm no murmurs rubs or gallops.  No JVD.  1-2+ bilateral lower extremity edema.  Gastrointestinal system: Abdomen is nondistended, soft and nontender. No organomegaly or masses felt. Normal bowel sounds heard.  Cholecystostomy tube noted right upper quadrant with drain in place.  Central nervous system: Patient alert today to self and place.  Moving extremities spontaneously.   Extremities: 1-2+ bilateral lower extremity edema.  Skin: No rashes, lesions or ulcers Psychiatry: Judgement and insight appear fair.  Mood and affect flat    Data Reviewed:   CBC: Recent Labs  Lab 08/22/21 0425 08/23/21 0456 08/24/21 0342 08/25/21 0159 08/26/21 0049 08/27/21 0450 08/28/21 0053  WBC 7.3 4.9 8.0 9.3 7.9 7.0 8.1  NEUTROABS 4.7 3.4 6.4  --   --  5.2 6.5  HGB 10.3* 9.2* 9.8* 10.4* 10.5*  9.6* 8.8*  HCT 31.6* 28.5* 29.7* 31.9* 32.7* 29.3* 27.1*  MCV 97.2 98.3 96.4 97.0 97.6 96.7 97.1  PLT 146* 103* 105* 98* 109* 98* 98*    Basic Metabolic Panel: Recent Labs  Lab 08/22/21 0425 08/23/21 0456 08/24/21 0342 08/25/21 0159 08/26/21 0049 08/27/21 0450 08/28/21 0053  NA 132*   < > 134* 132* 135 135 134*  K 3.9   < > 3.4* 3.9 3.9 4.3 4.4  CL 106   < > 107 105 105 107 106  CO2 19*   < > '22 23 22 '$ 21* 20*  GLUCOSE 91   < > 129* 154* 170* 151* 121*  BUN 38*   < > 40* 40* 42* 40* 41*  CREATININE 1.32*   < > 1.55* 1.65* 1.72* 1.71* 1.66*  CALCIUM 8.2*   < > 8.1* 7.9* 7.8* 7.7* 8.1*  MG 2.1  --   --  2.0  --  2.0  --   PHOS 4.6  --  3.1 3.1   --  3.6 4.0   < > = values in this interval not displayed.    GFR: Estimated Creatinine Clearance: 26.7 mL/min (A) (by C-G formula based on SCr of 1.66 mg/dL (H)).  Liver Function Tests: Recent Labs  Lab 08/22/21 0425 08/23/21 0456 08/24/21 0342 08/25/21 0159 08/27/21 0450 08/28/21 0053  AST 36 205*  --  46*  --   --   ALT 14 35  --  23  --   --   ALKPHOS 340* 637*  --  593*  --   --   BILITOT 0.6 1.6*  --  0.7  --   --   PROT 5.2* 5.3*  --  5.2*  --   --   ALBUMIN <1.5* 2.6* 2.1* 1.9* 2.0* 2.5*    CBG: No results for input(s): "GLUCAP" in the last 168 hours.   Recent Results (from the past 240 hour(s))  Urine Culture     Status: Abnormal (Preliminary result)   Collection Time: 08/23/21  8:22 AM   Specimen: Urine, Catheterized  Result Value Ref Range Status   Specimen Description URINE, CATHETERIZED  Final   Special Requests NONE  Final   Culture (A)  Final    >=100,000 COLONIES/mL ENTEROCOCCUS FAECIUM 20,000 COLONIES/mL GRAM NEGATIVE RODS IDENTIFICATION AND SUSCEPTIBILITIES TO FOLLOW Performed at [facility], [address], [place], [place] [address]    Report Status PENDING  Incomplete   Organism ID, Bacteria ENTEROCOCCUS FAECIUM (A)  Final      Susceptibility   Enterococcus faecium - MIC*    AMPICILLIN >=32 RESISTANT Resistant     LEVOFLOXACIN 2 SENSITIVE Sensitive     NITROFURANTOIN 128 RESISTANT Resistant     VANCOMYCIN <=0.5 SENSITIVE Sensitive     GENTAMICIN SYNERGY SENSITIVE Sensitive     * >=100,000 COLONIES/mL ENTEROCOCCUS FAECIUM  Urine Culture     Status: Abnormal   Collection Time: 08/26/21  8:07 AM   Specimen: Urine, Clean Catch  Result Value Ref Range Status   Specimen Description URINE, CLEAN CATCH  Final   Special Requests   Final    NONE Performed at [facility], [place] [address], [place], [place] [address]    Culture MULTIPLE SPECIES PRESENT, SUGGEST RECOLLECTION (A)  Final   Report Status 08/28/2021 FINAL  Final          Radiology Studies: No results found.      Scheduled Meds:  amLODipine  5 mg Oral  Daily   apixaban  5 mg Oral BID   bisacodyl  10 mg Rectal Once   ciprofloxacin  500 mg Oral Q breakfast   dronabinol  5 mg Oral BID AC   levothyroxine  125 mcg Oral QAC breakfast   metroNIDAZOLE  500 mg Oral Q12H   multivitamin with minerals  1 tablet Oral Daily   nystatin  5 mL Oral QID   pantoprazole  40 mg Oral Daily   polyethylene glycol  17 g Oral Daily   sodium chloride flush  5 mL Intracatheter Daily   sodium chloride flush  5 mL Intracatheter Daily   [START ON 09/01/2021] thiamine injection  100 mg Intravenous Daily   Continuous Infusions:  albumin human 25 g (08/28/21 1002)   thiamine injection 250 mg (08/28/21 1427)     LOS: 8 days    Time spent: 35 minutes    Irine Seal, MD [facility]   To contact the attending provider between 7A-7P or the covering provider during after hours 7P-7A, please log into the web site [email or web address] and access using universal [place] password for that web site. If you do not have the password, please call the hospital operator.  08/28/2021, 6:05 PM

## 2021-08-28 NOTE — TOC Progression Note (Signed)
Transition of Care [facility]) - Progression Note    Patient Details  Name: [name] MRN: [id number] Date of Birth: 11-Nov-1945  [facility] of [facility] CM/SW Contact  Dejae Bernet, Edson Snowball, RN Phone Number: 08/28/2021, 10:17 AM  Clinical Narrative:     Clarisse Gouge with [employer] called and [employer] cannot provide hospital bed and walker.   NCM called Lacresia with [facility] can provide DME. Patient's daughter's contact information provided.   In progression this morning anticipated discharge date possible FRiday.   Expected Discharge Plan: [place] Barriers to Discharge: No Barriers Identified  Expected Discharge Plan and Services Expected Discharge Plan: [place]   Discharge Planning Services: CM Consult Post Acute Care Choice: Durable Medical Equipment, Home Health Living arrangements for the past 2 months: Single Family Home                 DME Arranged: Hospital bed, Walker rolling DME Agency: [employer] Date DME Agency Contacted: 08/22/21 Time DME Agency Contacted: [id number] Representative spoke with at DME Agency: Alecia Lemming HH Arranged: RN, PT, OT, Nurse's Aide [place] Agency: [place] Date [place]: 08/22/21 Time [place]: 1941 Representative spoke with at [place]: [place] ([place]) Interventions    Readmission Risk Interventions     No data to display

## 2021-08-29 ENCOUNTER — Ambulatory Visit: Payer: Medicare HMO | Admitting: Internal Medicine

## 2021-08-29 DIAGNOSIS — N179 Acute kidney failure, unspecified: Secondary | ICD-10-CM | POA: Diagnosis not present

## 2021-08-29 DIAGNOSIS — E039 Hypothyroidism, unspecified: Secondary | ICD-10-CM | POA: Diagnosis not present

## 2021-08-29 DIAGNOSIS — K819 Cholecystitis, unspecified: Secondary | ICD-10-CM | POA: Diagnosis not present

## 2021-08-29 DIAGNOSIS — E871 Hypo-osmolality and hyponatremia: Secondary | ICD-10-CM | POA: Diagnosis not present

## 2021-08-29 DIAGNOSIS — E860 Dehydration: Secondary | ICD-10-CM | POA: Diagnosis not present

## 2021-08-29 LAB — CBC WITH DIFFERENTIAL/PLATELET
Abs Immature Granulocytes: 0.09 10*3/uL — ABNORMAL HIGH (ref 0.00–0.07)
Basophils Absolute: 0 10*3/uL (ref 0.0–0.1)
Basophils Relative: 0 %
Eosinophils Absolute: 0 10*3/uL (ref 0.0–0.5)
Eosinophils Relative: 0 %
HCT: 27.8 % — ABNORMAL LOW (ref 36.0–46.0)
Hemoglobin: 9.1 g/dL — ABNORMAL LOW (ref 12.0–15.0)
Immature Granulocytes: 1 %
Lymphocytes Relative: 15 %
Lymphs Abs: 1.3 10*3/uL (ref 0.7–4.0)
MCH: 32.2 pg (ref 26.0–34.0)
MCHC: 32.7 g/dL (ref 30.0–36.0)
MCV: 98.2 fL (ref 80.0–100.0)
Monocytes Absolute: 0.8 10*3/uL (ref 0.1–1.0)
Monocytes Relative: 9 %
Neutro Abs: 6.3 10*3/uL (ref 1.7–7.7)
Neutrophils Relative %: 75 %
Platelets: 117 10*3/uL — ABNORMAL LOW (ref 150–400)
RBC: 2.83 MIL/uL — ABNORMAL LOW (ref 3.87–5.11)
RDW: 19.6 % — ABNORMAL HIGH (ref 11.5–15.5)
WBC: 8.5 10*3/uL (ref 4.0–10.5)
nRBC: 0 % (ref 0.0–0.2)

## 2021-08-29 LAB — BASIC METABOLIC PANEL
Anion gap: 7 (ref 5–15)
BUN: 42 mg/dL — ABNORMAL HIGH (ref 8–23)
CO2: 21 mmol/L — ABNORMAL LOW (ref 22–32)
Calcium: 8.1 mg/dL — ABNORMAL LOW (ref 8.9–10.3)
Chloride: 107 mmol/L (ref 98–111)
Creatinine, Ser: 1.54 mg/dL — ABNORMAL HIGH (ref 0.44–1.00)
GFR, Estimated: 34 mL/min — ABNORMAL LOW (ref >60)
Glucose, Bld: 114 mg/dL — ABNORMAL HIGH (ref 70–99)
Potassium: 4.2 mmol/L (ref 3.5–5.1)
Sodium: 135 mmol/L (ref 135–145)

## 2021-08-29 LAB — URINE CULTURE: Culture: >=100000 — AB

## 2021-08-29 NOTE — Progress Notes (Signed)
PT Cancellation Note  Patient Details Name: [name] MRN: [id number] DOB: 1945/06/25   Cancelled Treatment:    Reason Eval/Treat Not Completed: Other (comment) Pt refusing due to malaise.  Wyona Almas, PT, DPT Acute Rehabilitation Services Office [phone]    Deno Etienne 08/29/2021, 5:03 PM

## 2021-08-29 NOTE — Progress Notes (Signed)
OT Cancellation Note  Patient Details Name: [name] MRN: [id number] DOB: 20-Apr-1945   Cancelled Treatment:     Pt with a strong R lateral lean and position in the bed. Pt reports discomfort to the R side of the neck. Spouse states I tried to fix her and she wouldn't let me.  Pt did allow OT to help place towel roll/ adjust pillow and trunk toward midline.Pt began to calling out in pain resisting so positioning terminated prior to midline comfort. Pt states "better that's enough" Pt reports buttock area uncomfortable but declines skin check or rolling. Recommend for air mattress expressed to MD attending. Ot following but without any progress with evaluation pt at this time after > 4 attempts  Jeri Modena 08/29/2021, 2:12 PM

## 2021-08-29 NOTE — Progress Notes (Signed)
HEMATOLOGY-ONCOLOGY PROGRESS NOTE  ASSESSMENT AND PLAN: Adenocarcinoma the pancreas body, stage IV (cT4,cN0,pM1) Ultrasound abdomen 08/18/2020-possible hypoechoic pancreas body mass, small hypoechoic liver areas MRI abdomen 08/27/2020-pancreas body mass, multiple hepatic lesions consistent with metastases, right abdominal omental nodularity, pancreas mass extends to the celiac bifurcation and abuts the splenic vein and splenoportal confluence CT chest 09/07/2020-scattered pulmonary nodules concerning for metastases, pancreas body mass, hepatic metastases Ultrasound-guided biopsy of a right liver lesion 09/08/2020-adenocarcinoma consistent with a pancreas primary CT abdomen/pelvis 09/24/2020-pancreas neck mass, omental and peritoneal nodularity-unchanged, multiple hypoenhancing liver masses-unchanged, numerous small pulmonary nodules Cycle 1 gemcitabine/Abraxane 10/04/2020 Cycle 2 gemcitabine/Abraxane 10/27/2020 Cycle 3 gemcitabine/Abraxane 11/11/2018 Cycle 4 gemcitabine/Abraxane 11/23/2020 Cycle 5 gemcitabine/Abraxane 12/08/2020 CT abdomen/pelvis 12/18/2020-stable pancreas mass, stable and improved liver lesions, resolution of omental soft tissue density, stable indeterminate lung nodules, no disease progression Cycle 6 gemcitabine/Abraxane 12/22/2020 Cycle 7 gemcitabine 01/05/2021, 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  Cycle 8 gemcitabine/Abraxane 01/19/2021 Cycle 9 gemcitabine/Abraxane 02/01/2021 Cycle 10 Gemcitabine 02/16/2021, Abraxane held due to increased neuropathy Cycle 11 Gemcitabine 03/05/2021, 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 Cycle 12 gemcitabine 03/14/2021, 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 Cycle 13 gemcitabine 04/02/2021, 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 Cycle 14 gemcitabine/Abraxane 04/16/2021, Abraxane resumed at a reduced dose Cycle 15 gemcitabine/Abraxane 04/30/2021 CT abdomen/pelvis 05/08/2021-stable pancreas mass, liver metastases are slightly smaller, stable tiny bilateral lower lung nodules Cycle  16 gemcitabine/Abraxane 05/14/2021, Abraxane escalated back to 100 mg per metered squared Cycle 17 gemcitabine/Abraxane 05/28/2021 Cycle 18 gemcitabine/Abraxane 06/11/2021 Cycle 19 gemcitabine/Abraxane 07/09/2021 CT Abdo/pelvis 07/18/2021-diffuse gallbladder wall thickening and edema, small volume ascites, stable pulmonary nodules, indeterminate 15 x 18 mm right liver lesion, ill-defined hypodensity in the body of the pancreas 07/24/2021-right pleural fluid cytology-negative   2.  Pain secondary #1 3.  Diabetes 4.  Hypertension 5.  Osteoarthritis 6.  Port-A-Cath placement 09/22/2020 7.  Admission with jaundice 09/24/2020.  MRI-new abrupt constriction of the common hepatic duct.  The infiltrative pancreatic mass extends medially in the porta hepatis to obstruct the common hepatic duct.  Multiple right hepatic lobe metastases mildly increased in size.  Stent placed into the common bile duct 09/28/2020. 8.  COVID-19 06/20/2021, admission with COVID and bacterial pneumonia?  06/30/2021-completed course of antibiotics 9.  Hospital admission 07/18/2021-bacteremia, UTI, acute cholecystitis Cholecystostomy tube 07/20/2021 CT abdomen/pelvis 07/24/2021-moderate right pleural effusion, new subcapsular fluid collections adjacent to the right liver, interval cholecystostomy tube, stable pancreas body mass, incidental pulmonary embolism and a left lower lobe pulmonary artery Drain in perihepatic fluid collection 07/27/2021 10.  Pulmonary embolism/bilateral DVTs CT abdomen/pelvis 07/24/2021-incidental left lower lobe pulmonary embolism CT chest 07/24/2021-segmental and subsegmental bilateral pulmonary emboli Dopplers 07/24/2021-DVTs in the right common femoral, left common femoral, left profunda veins Heparin 07/24/2021, converted to apixaban on discharge 08/01/2021 11.  Bilateral foot numbness developing during hospital admission May 2023-Abraxane neuropathy? [facility] admission 08/20/2021-dehydration, weakness  [name]  continues to be very weak.  She remains in bed.  She moves her legs, but is unable to lift her legs.  I discussed the poor prognosis with [name].  We discussed skilled nursing facility placement versus home with family and hospice.  I recommend home hospice care.  [name] says she will agree to a hospice referral, but wishes to discuss this again with her daughter prior to hospice enrollment.   Recommendations: 1.  Discuss home hospice care with the patient's family today 2.  Antibiotics per ID. 3.  Out of bed to chair  Betsy Coder   SUBJECTIVE: [name] was alert  when I saw her at approximately 6:15 this morning.  She remains very weak and nonambulatory. Oncology History Overview Note  Cancer Staging Primary pancreatic cancer with metastasis to other site [facility]) Staging form: Exocrine Pancreas, AJCC 8th Edition - Clinical stage from 09/08/2020: Stage IV (cT4, cN0, pM1) - Signed by Truitt Merle, MD on 09/12/2020 Stage prefix: Initial diagnosis Total positive nodes: 0    Primary pancreatic cancer with metastasis to other site ([place])  08/27/2020 Imaging   Abdominal MRI w wo contrast: IMPRESSION: 1. Pancreatic body infiltrative mass, favoring adenocarcinoma. 2. Multiple hepatic lesions, most consistent with metastasis. 3. Right abdominal omental nodularity, highly suspicious for peritoneal metastasis. 4. Recommend multidisciplinary oncology consultation for eventual sampling of either the liver or pancreatic lesions. 5. Vascular involvement with tumor, as detailed above.   09/07/2020 Imaging   CT chest  IMPRESSION: 1. Scattered small pulmonary nodules, worrisome for metastatic disease. 2. Pancreatic body mass and hepatic metastases, better seen and described on MR abdomen 08/27/2020. 3. Aortic atherosclerosis (ICD10-I70.0). Coronary artery calcification.   09/08/2020 Cancer Staging   Staging form: Exocrine Pancreas, AJCC 8th Edition - Clinical stage from 09/08/2020: Stage  IV (cT4, cN0, pM1) - Signed by Truitt Merle, MD on 09/12/2020 Stage prefix: Initial diagnosis Total positive nodes: 0   09/08/2020 Pathology Results   A. LIVER, RIGHT, BIOPSY:  - Adenocarcinoma.  See comment.   COMMENT:   The morphology is consistent with a pancreatic primary.    09/12/2020 Initial Diagnosis   Primary pancreatic cancer with metastasis to other site [facility])   10/12/2020 -  Chemotherapy   Patient is on Treatment Plan : PANCREATIC Abraxane / Gemcitabine D1,8,15 q28d       PHYSICAL EXAMINATION:  Vitals:   08/29/21 0431 08/29/21 0729  BP: 125/82 (!) 151/100  Pulse: 100 96  Resp: 18 16  Temp: 98.3 F (36.8 C) 98.5 F (36.9 C)  SpO2: 94% 95%   Filed Weights   08/20/21 2200  Weight: 160 lb 7.9 oz (72.8 kg)    Intake/Output from previous day: 06/13 0701 - 06/14 0700 In: 5  Out: 550 [Urine:500; Drains:50]  Exam: Awake, conversant HEENT-no thrush  Abdomen-two right upper abdomen drains, drainage, no mass, mildly distended Vascular-pitting edema to the bilateral lower extremities and upper extremities, improved  LABORATORY DATA:  I have reviewed the data as listed    Latest Ref Rng & Units 08/28/2021   12:53 AM 08/27/2021    4:50 AM 08/26/2021   12:49 AM  CMP  Glucose 70 - 99 mg/dL 121  151  170   BUN 8 - 23 mg/dL 41  40  42   Creatinine 0.44 - 1.00 mg/dL 1.66  1.71  1.72   Sodium 135 - 145 mmol/L 134  135  135   Potassium 3.5 - 5.1 mmol/L 4.4  4.3  3.9   Chloride 98 - 111 mmol/L 106  107  105   CO2 22 - 32 mmol/L '20  21  22   '$ Calcium 8.9 - 10.3 mg/dL 8.1  7.7  7.8     Lab Results  Component Value Date   WBC 8.1 08/28/2021   HGB 8.8 (L) 08/28/2021   HCT 27.1 (L) 08/28/2021   MCV 97.1 08/28/2021   PLT 98 (L) 08/28/2021   NEUTROABS 6.5 08/28/2021    Lab Results  Component Value Date   CAN199 16,591 (H) 08/09/2021    US RENAL  Result Date: 08/26/2021 CLINICAL DATA:  Renal dysfunction EXAM: RENAL / URINARY TRACT  ULTRASOUND COMPLETE COMPARISON:   08/21/2021 FINDINGS: Right Kidney: Renal measurements: 10.6 x 5.1 x 5.9 cm = volume: 168 mL. There is no hydronephrosis. There is increased cortical echogenicity. There is 3.5 cm cyst in the upper pole. There is 1.6 cm cyst in the midportion. Left Kidney: Renal measurements: 9.4 x 4.7 x 5.4 cm = volume: 124 mL. There is no hydronephrosis. There is increased cortical echogenicity. Bladder: Appears normal for degree of bladder distention. Other: Small ascites is seen. Minimal amount of perinephric fluid collection may be part of ascites. Spleen appears to be enlarged in size. There is possible right pleural effusion. IMPRESSION: There is no hydronephrosis. There is increased cortical echogenicity in both kidneys suggesting medical renal disease. Right renal cysts. Ascites.  Right pleural effusion. Electronically Signed   By: Elmer Picker M.D.   On: 08/26/2021 12:01   EEG adult  Result Date: 08/23/2021 Lora Havens, MD     08/23/2021  8:07 PM Patient Name: DELROSE ROHWER MRN: [id number] Epilepsy Attending: Lora Havens Referring Physician/Provider: Eugenie Filler, MD Date: 08/23/2021 Duration: 28.24 mins Patient history: 76yo F with ams and continuous jerking movements of both arms. EEG to evaluate for seizure. Level of alertness: lethargic AEDs during EEG study: None Technical aspects: This EEG study was done with scalp electrodes positioned according to the 10-20 International system of electrode placement. Electrical activity was acquired at a sampling rate of '500Hz'$  and reviewed with a high frequency filter of '70Hz'$  and a low frequency filter of '1Hz'$ . EEG data were recorded continuously and digitally stored. Description: No clear posterior dominant rhythm was seen. EEG showed continuous generalized polymorphic 3-'5Hz'$  theta-delta slowing. Generalized periodic discharges with triphasic morphology at 1-1.'5Hz'$  were also noted, more prominent when awake/stimulated. Hyperventilation and photic stimulation  were not performed.    ABNORMALITY - Periodic discharges with triphasic morphology, generalized ( GPDs) - Continuous slow, generalized  IMPRESSION: This study showed generalized periodic discharges with triphasic morphology. This EEG pattern is on the ictal-interictal continuum. However the morphology, frequency and reactivity to stimulation is more commonly indicative of toxic-metabolic causes like hyperammonemia, cefepime toxicity. Additionally there is moderate to severe diffuse encephalopathy. No seizures were seen throughout the recording.  If patient remains altered, can consider prolonged EEG monitoring.  Priyanka Barbra Sarks    CT HEAD WO CONTRAST (5MM)  Result Date: 08/23/2021 CLINICAL DATA:  Altered mental status EXAM: CT HEAD WITHOUT CONTRAST TECHNIQUE: Contiguous axial images were obtained from the base of the skull through the vertex without intravenous contrast. RADIATION DOSE REDUCTION: This exam was performed according to the departmental dose-optimization program which includes automated exposure control, adjustment of the mA and/or kV according to patient size and/or use of iterative reconstruction technique. COMPARISON:  06/01/2016 FINDINGS: Brain: No acute intracranial findings are seen in noncontrast CT brain. There are no signs of bleeding. Cortical sulci are prominent. There is decreased density in the periventricular white matter. Vascular: Unremarkable. Skull: Unremarkable. Sinuses/Orbits: Unremarkable. Other: No significant interval changes are noted. IMPRESSION: No acute intracranial findings are seen in noncontrast CT brain. Atrophy. Small-vessel disease. Electronically Signed   By: Elmer Picker M.D.   On: 08/23/2021 13:11   CT ABDOMEN PELVIS W CONTRAST  Result Date: 08/21/2021 CLINICAL DATA:  Cholecystitis status post cholecystostomy tube placement. History of pancreatic cancer. EXAM: CT ABDOMEN AND PELVIS WITH CONTRAST TECHNIQUE: Multidetector CT imaging of the abdomen and  pelvis was performed using the standard protocol following bolus administration of intravenous contrast. RADIATION DOSE REDUCTION: This  exam was performed according to the departmental dose-optimization program which includes automated exposure control, adjustment of the mA and/or kV according to patient size and/or use of iterative reconstruction technique. CONTRAST:  161m OMNIPAQUE IOHEXOL 300 MG/ML  SOLN COMPARISON:  CT abdomen pelvis dated August 16, 2021. FINDINGS: Lower chest: Unchanged moderate right and small left pleural effusions with adjacent atelectasis in both posterior lower lobes. Hepatobiliary: Multiple hypodense liver lesions are unchanged. Right perihepatic drainage catheter remains in place without significant residual perihepatic fluid collection. 2.2 cm posteroinferior perihepatic fluid collection is unchanged (series 3, image 20). New small amount of pneumobilia in the left liver. Multiple gallstones again noted with slightly increased distention of the gallbladder status post cholecystostomy tube placement. Unchanged common bile duct stent. Pancreas: Unchanged hypodense mass in the pancreatic neck with atrophy of the body and tail. Spleen: Unchanged wedge-shaped hypodensity in the inferior spleen, likely a small infarct. Normal in size. Adrenals/Urinary Tract: Adrenal glands are unremarkable. Unchanged small right renal simple cysts. No follow-up imaging is recommended. No renal calculi or hydronephrosis. The bladder is distended with excreted contrast. Stomach/Bowel: Unchanged small hiatal hernia. The stomach is otherwise within normal limits. No bowel wall thickening, distention, or surrounding inflammatory changes. Scattered mild colonic diverticulosis. Similar moderate stool burden throughout the colon. Normal appendix. 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  Aortoiliac atherosclerotic vascular disease. No enlarged abdominal or pelvic lymph nodes. Reproductive: Status post hysterectomy. No adnexal masses. Other: Decreased small volume ascites in the pelvis. No pneumoperitoneum. Musculoskeletal: No acute or significant osseous findings. Unchanged anasarca. IMPRESSION: 1. Status post cholecystostomy tube placement with slightly increased distention of the gallbladder compared to prior study. Correlate with tube function. 2. Right perihepatic drainage catheter remains in place without significant residual perihepatic fluid collection around the drain. Unchanged 2.2 cm posteroinferior perihepatic fluid collection separate from the drain. 3. 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 4. Decreased small volume ascites in the pelvis. 5. Unchanged moderate right and small left pleural effusions. 6. 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 7. Aortic Atherosclerosis (ICD10-I70.0). Electronically Signed   By: WTitus DubinM.D.   On: 08/21/2021 17:58   [place]  Result Date: 08/21/2021 CLINICAL DATA:  Acute kidney injury EXAM: RENAL / URINARY TRACT ULTRASOUND COMPLETE COMPARISON:  CT abdomen 08/16/2021 FINDINGS: Right Kidney: Renal measurements: 9.5 by 4.0 by 5.0 cm = volume: 102 mL. Mildly hyperechoic relative to the liver. No solid mass or hydronephrosis visualized. A right kidney upper pole cyst measures 2.0 by 3.0 by 2 2.6 cm and appears simple. Left Kidney: Renal measurements: 9.7 by 4.0 by 4.8 cm = volume: 99 mL. Mildly hyperechoic relative to the spleen. No mass or hydronephrosis visualized. Bladder: Appears normal for degree of bladder distention. A left ureteral jet is visualized. Ascites in the lower abdomen. IMPRESSION: 1. Bilateral renal parenchymal hyperechogenicity suggesting chronic medical renal disease. 2. Right kidney upper pole  simple appearing cyst. 3. A left ureteral jet is visualized in the urinary bladder. Right ureteral jet not seen, although this may simply be incidental. 4. Pelvic ascites. Electronically Signed   By: WVan ClinesM.D.   On: 08/21/2021 16:00   DG CHEST PORT 1 VIEW  Result Date: 08/20/2021 CLINICAL DATA:  Status post PICC line placement. EXAM: PORTABLE CHEST 1 VIEW COMPARISON:  07/27/2021. FINDINGS: The  heart size and mediastinal contours are within normal limits. There is atherosclerotic calcification of the aorta. Patchy airspace disease is noted at the right lung base. There are questionable small bilateral pleural effusions. No pneumothorax. A right chest port is stable. No PICC line is identified. A stable pigtail catheter is noted in the right upper quadrant. No acute osseous abnormality. IMPRESSION: 1. Small bilateral pleural effusions with atelectasis at the lung bases. 2. No PICC line is identified.  Clinical correlation is recommended. Electronically Signed   By: Brett Fairy M.D.   On: 08/20/2021 23:45   CT ABDOMEN PELVIS W CONTRAST  Result Date: 08/17/2021 CLINICAL DATA:  Abdominal pain, acute, nonlocalized EXAM: CT ABDOMEN AND PELVIS WITH CONTRAST TECHNIQUE: Multidetector CT imaging of the abdomen and pelvis was performed using the standard protocol following bolus administration of intravenous contrast. RADIATION DOSE REDUCTION: This exam was performed according to the departmental dose-optimization program which includes automated exposure control, adjustment of the mA and/or kV according to patient size and/or use of iterative reconstruction technique. CONTRAST:  60m OMNIPAQUE IOHEXOL 300 MG/ML  SOLN COMPARISON:  08/15/2021 FINDINGS: Lower chest: Small bilateral pleural effusions. Compressive atelectasis in the lower lobes. Findings stable since prior study. Hepatobiliary: Right perihepatic drainage catheter remains in place with near complete resolution of perihepatic fluid collection.  Inferior perihepatic fluid collection again noted, measuring 2.3 cm, stable. Numerous hypodensities throughout the liver are again seen and unchanged compatible with metastases. Cholecystostomy tube in place. Gallbladder decompressed. Gallstone noted within the gallbladder. Common bile duct stent noted, unchanged. Pancreas: Ill-defined hypodensity in the body of the pancreas compatible with known pancreatic malignancy. Pancreatic tail atrophy. Spleen: No focal abnormality.  Normal size. Adrenals/Urinary Tract: Right upper pole renal cyst. No hydronephrosis. Adrenal glands and urinary bladder unremarkable. Stomach/Bowel: Large stool burden in the colon. Sigmoid diverticulosis. Stomach and small bowel decompressed. Vascular/Lymphatic: Aortoiliac atherosclerosis. No adenopathy or aneurysm. Reproductive: Prior hysterectomy.  No adnexal masses. Other: Moderate free fluid in the pelvis, unchanged. Musculoskeletal: No acute bony abnormality. IMPRESSION: Essentially stable CT since 2 days prior. Small bilateral pleural effusions with compressive atelectasis. Numerous metastases within the liver, stable. Perihepatic fluid collections are stable. Drainage catheter within a right perihepatic fluid collection and in the gallbladder. Pancreatic body mass compatible with known pancreatic malignancy. Pancreatic tail atrophy. Large stool burden in the colon. Aortic atherosclerosis. Moderate free fluid in the pelvis. Electronically Signed   By: KRolm BaptiseM.D.   On: 08/17/2021 02:22   CT ABDOMEN PELVIS W CONTRAST  Result Date: 08/15/2021 CLINICAL DATA:  Briefly, 76year old female with a history of pancreatic tumor, perihepatic fluid collection and acute cholecystitis s/p perihepatic drain and cholecystostomy drain placement. EXAM: CT ABDOMEN AND PELVIS WITH CONTRAST TECHNIQUE: Multidetector CT imaging of the abdomen and pelvis was performed using the standard protocol following bolus administration of intravenous contrast.  RADIATION DOSE REDUCTION: This exam was performed according to the departmental dose-optimization program which includes automated exposure control, adjustment of the mA and/or kV according to patient size and/or use of iterative reconstruction technique. CONTRAST:  1035mISOVUE-300 IOPAMIDOL (ISOVUE-300) INJECTION 61% COMPARISON:  IR fluoroscopy, earlier same day.  CT AP, 07/24/2021. FINDINGS: Lower chest: Persistent small volume bilateral pleural effusions, RIGHT-greater-than LEFT, and adjacent dependent consolidations Hepatobiliary: *Well-positioned percutaneous drainage catheter with near-complete resolution of previously large RIGHT perihepatic fluid collection. *Trace residual collection along the hepatic dome measures up to 5.0 x 1.0 cm, previously 17 cm. *Additional inferomedial perihepatic fluid collection measures 3.0 x 1.7 cm, previously 6 cm *  Multifocal hypodense lesions within the LEFT and RIGHT hepatic lobes, largest at hepatic segment VI measuring 3.5 x 2.5 cm, suspicious for hepatic metastases. *Stable positioning of cholecystostomy drain with contracted gallbladder. Metallic biliary stent. No intra or extrahepatic biliary ductal dilation. Pancreas: Normal contrast enhancement of the pancreatic head. Similar ill-defined hypodensity within the body of pancreas and pancreatic tail atrophy. Findings compatible with known pancreatic malignancy Spleen: New area of hypodensity within the inferior splenic pole, in an area measuring 3.0 x 2.0 cm. Adrenals/Urinary Tract: Adrenal glands are unremarkable. RIGHT exophytic and renal cortical renal cysts, unchanged. The LEFT kidney is normal. No renal calculi or hydronephrosis. Bladder is unremarkable. Stomach/Bowel: Small hiatus hernia. Nonobstructed bowel. Severe burden of sigmoid diverticulosis. No evidence of bowel wall thickening, distention, or inflammatory changes. Vascular/Lymphatic: Aortic atherosclerosis without aneurysmal dilation. No enlarged abdominal  or pelvic lymph nodes. Reproductive: Status post hysterectomy. No adnexal mass. Other: Small volume of residual ascites within the dependent pelvis. Musculoskeletal: Mild body wall edema. Multilevel spinal degenerative change. No interval osseous abnormality. IMPRESSION: Since CT AP dated 07/24/2021; 1. Well-positioned percutaneous drainage catheter with near-complete resolution of previously large perihepatic free fluid collection. Small volume residual inferomedial hepatic fluid collection. 2. Stable positioning of cholecystostomy drain with contracted gallbladder. 3. Metallic biliary stent without intra- or extrahepatic biliary ductal dilation. 4. Pancreatic body mass consistent with known malignancy. Increased conspicuity of multifocal hepatic lesions, as above, is suspicious for metastases. 5. New hypodensity within the inferior splenic pole, suspicious for regional hypoperfusion and splenic infarct. 6. Bilateral small volume pleural effusions, greater on RIGHT. Additional incidental, chronic and senescent findings as above. These results will be called to the ordering clinician or representative by the Radiologist Assistant, and communication documented in the PACS or [employer]. Electronically Signed   By: Michaelle Birks M.D.   On: 08/15/2021 17:41   DG Sinus/Fist Tube Chk-Non GI  Result Date: 08/15/2021 CLINICAL DATA:  Briefly, 76 year old female with a history of pancreatic tumor, perihepatic fluid collection and acute cholecystitis s/p perihepatic drain and cholecystostomy drain placement. EXAM: CHOLECYSTOSTOMY DRAIN INJECTION COMPARISON:  CT AP, concurrent and 07/24/2021.  IR CT, 07/25/2021. CONTRAST:  40 mL Omnipaque 300-administered via the existing cholecystostomy drain. FLUOROSCOPY TIME:  Fluoroscopic dose; 11.3 mGy TECHNIQUE: The patient was positioned supine on the fluoroscopy table. A preprocedural spot fluoroscopic image was obtained of the RIGHT upper quadrant and the existing  percutaneous cholecystostomy drainage catheter. Multiple spot fluoroscopic and radiographic images were obtained following the injection of a small amount of contrast via the existing percutaneous cholecystostomy catheter. A contrast injection for the perihepatic drainage catheter was not performed. Procedure performed by Pasty Spillers, IR PA FINDINGS: Cholecystostomy drain injection without evidence of cystic duct patency. Metallic biliary stent. IMPRESSION: No fluoroscopic evidence of cystic duct patency. The cholecystostomy drain was therefore LEFT in place. Electronically Signed   By: Michaelle Birks M.D.   On: 08/15/2021 16:41   IR Radiologist Eval & Mgmt  Result Date: 08/15/2021 Please refer to notes tab for details about interventional procedure. (Op Note)    Future Appointments  Date Time Provider [place]  09/05/2021 10:00 AM Rosiland Oz, MD [facility]  10/17/2021  3:00 PM [name], MD [facility]  12/10/2021 11:45 AM [facility] HEALTH COACH [facility]      LOS: 9 days

## 2021-08-29 NOTE — Progress Notes (Signed)
PROGRESS NOTE    [name]  NAT:[id number] DOB: 1945/07/16 DOA: 08/20/2021 PCP: [name], MD    No chief complaint on file.   Brief Narrative:  [name] is a 76 y.o. female with medical history significant for acute cholecystitis status post cholecystostomy, history of pancreatic cancer, hypothyroidism, hypertension, generalized anxiety disorder, history of venous thromboembolic disease, recent hospitalization for sepsis with Morganella morganii/E. coli bacteremia who was sent to the hospital as a direct admission for evaluation of poor oral intake and severe dehydration. Patient's daughter states that since her discharge her oral intake has been very poor but over the last several days she has not had anything to eat or drink due to persistent nausea and vomiting despite taking antiemetics as well as poor appetite.  She complains of feeling very weak and tired and has been unable to get out of bed for the last 2 days.  She feels her legs are very heavy and unable to bear her weight.  She denies having any falls. She complains of abdominal pain mostly in the left lumbar area which she rates a 5 x 10 in intensity at its worst.  But denies having any changes in her bowel habits.  She denies having any fever, no chills, no urinary symptoms, no dizziness, no lightheadedness, no chest pain, no shortness of breath, no headache, no blurred vision or focal deficit. Her daughter is concerned about her progressive decline and inability to participate in physical therapy due to weakness.    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   -Encourage oral intake.   -Patient started to develop some volume overload with some lower extremity edema likely secondary to third spacing. -Saline lock IV fluids. -Follow.  Cholecystitis Status post cholecystostomy tube insertion In addition, had perihepatic drain placed Saw IR on 5/31 and plan for capping trial with return for imaging in 2 weeks Her symptoms seem to have worsened after this trial, unclear if this is related? CT 08/16/21 with stable CT, small bilateral effusions, numerous mets within liver, drainage catheter within right perihepatic fluid collection and in the gallbladder, pancreatic body mass c/w known pancreatic malignancy, large stool burden IR consulted to see about reattaching bags. With E. coli and Morganella morganii bacteremia Was on antibiotic therapy with cefepime and Flagyl. Patient with acute metabolic encephalopathy on 08/23/2021 and noted to be pocketing pills and also refusing some of her medications and as such oral Flagyl changed to IV Flagyl.  -Also due to concerns that cefepime may be contributing to metabolic encephalopathy cefepime changed to IV ciprofloxacin per ID.   -Patient transitioned to oral ciprofloxacin and oral Flagyl per ID. ID following and appreciate input and recommendations. -General surgery wrote a note that patient no longer a  candidate for systemic chemotherapy due to poor status, due to patient's functional status, poor prognosis per general surgery not a candidate for surgical intervention for the gallbladder and will need to keep  draining at this point indefinitely.  General surgery deferred to IR for further management.  Acute metabolic encephalopathy - Patient noted to be drowsy, confusion, noted to have some myoclonic jerking on 08/23/2021.   -Likely multifactorial secondary to medications including narcotics, benzos in the setting of AKI. -Patient noted to have received Xanax and Remeron at the same time the evening of 08/22/2021, as well as oxycodone 1 to 2 hours prior to that. -Head CT done unremarkable.   -EEG done was negative for seizures.  -Ammonia level within normal limits. -Discontinued Remeron and decreased oxycodone to 1 tablet every 6 hours as needed. -Vitamin B1 level pending. -Cefepime discontinued. -Patient started on high-dose IV thiamine per neurology recommendations. -Patient with clinical improvement, alert, following commands appropriately. -Neurology consulted, was following but have signed off. -Supportive care.  AKI (acute kidney injury) ([place]) -IV fluids held due to mild swelling noted.   -Swelling likely secondary to severe hypoalbuminemia.   Right hand with ring stuck on ring finger, Dr. Florene Glen discussed with RN to help with removal. -Ring has been removed. Renal US done early on during the hospitalization-> without hydro -renal function was initially improving however trending back up likely secondary to poor oral intake.  -Urinalysis ordered however patient refused and not done. -Renal function trending up and trending back down. -Urine sodium noted at 14. -Bladder scan done with about 1200 cc of urine noted, I and O cath none on 08/26/2021 with 900 cc of urine noted.   -Every 6 bladder scan, I and o every 6 hours as needed.  -Renal ultrasound done negative for hydronephrosis.   -Patient starting to become volume overloaded and as such IV fluids have been saline locked. -Patient placed on IV albumin twice daily.   -Renal function seems to be trending back down.   -Avoid nephrotoxins.    -Follow.  Weakness Secondary to poor oral intake as well as multiple comorbid conditions. Possibly related to capping trial? Follow symptoms after this. PT/OT assessed patient recommending SNF however it is noted that family and patient want patient to go home with home health.  Diet-controlled diabetes mellitus ([place]) 6.3 a1c 06/2021 -Blood glucose level of 114 this morning on lab work.   -Bicarb drip has been discontinued -Saline lock IV fluids. -Supportive care.    Essential hypertension -Controlled on current regimen of amlodipine.   -Patient with altered mental status drowsy with poor oral intake and pocketing medications early on in the hospitalization which has since improved.. -Mental status improved.  -Amlodipine resumed.   -Follow.  Acquired hypothyroidism Stable. -Synthroid. Outpatient follow-up.  Decubitus skin ulcer     Anasarca - Secondary to malnutrition/hypoalbuminemia. -IV fluids-saline lock. -On IV albumin. -Anasarca improved.  Acute urinary retention - Bladder scan done 08/26/2021 with 1200 cc of urine noted per RN. -I and O cath done on 08/26/2021 with 900 cc of urine output. -Serial bladder scans, I and O cath every 6 hours as needed. -Foley catheter placed due to urinary retention. -We will need outpatient follow-up with urology for voiding trial and further evaluation.  Oral thrush - Patient with some concerns for oral thrush. -Continue nystatin swish and swallow.  Protein-calorie malnutrition, severe ([place]) - Patient with severe protein calorie malnutrition with albumin level < 1.5. -Likely secondary to metastatic pancreatic cancer in the setting  of poor oral intake secondary to nausea. -Placed on scheduled Zofran 3 times daily x 24 hours. -Status post IV albumin every 6 hours x24 hours. -Encourage oral intake. -Due to acute metabolic encephalopathy with drowsiness and lethargy with some myoclonic jerks Remeron discontinued.  -Patient seen  by speech therapist and patient placed on a regular diet with thin liquids.  -Marinol ordered for appetite stimulant per palliative care however noted to be refusing early on in the hospitalization, however seems to be taking it. -Continue IV albumin twice daily.  -Encourage oral intake.  -Supportive care.  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 Benay Spice during this hospitalization. -Hospice recommended per oncology if patient does not start to improve soon.         DVT prophylaxis: Eliquis Code Status: DNR Family Communication: Updated husband at bedside.  Disposition: Likely home with home health therapies  Status is: Inpatient Remains inpatient appropriate because: Severity of illness   Consultants:  Interventional radiology Infectious disease: Dr. Candiss Norse 08/21/2021 Palliative care: Dr. Rowe Pavy 08/21/2021 Oncology informed via epic of admission. Neurology: Dr. Leonel Ramsay 08/23/2021  Procedures:  CT abdomen and pelvis 08/21/2021 Chest x-ray 08/20/2021 Renal ultrasound 08/21/2021, 08/26/2021 CT head 08/23/2021 EEG 08/23/2021 CT head 08/23/2021    Antimicrobials:  Oral Flagyl 08/20/2021>>>> IV Flagyl 08/23/2021>>>> IV cefepime 08/21/2021>>>> 08/23/2021 IV ciprofloxacin 08/23/2021>>> 08/27/2021 Oral ciprofloxacin 08/28/2021>>>>    Subjective: Alert.  Laying in bed.  Denies any shortness of breath.  No chest pain.  Feels swelling has improved.  Tolerating diet and still with poor oral intake and eating about 25% of meals  .  Objective: Vitals:   08/28/21 1634 08/28/21 2010 08/29/21 0431 08/29/21 0729  BP: 131/88 125/87 125/82 (!) 151/100  Pulse: 99 99 100 96  Resp: '13 20 18 16  '$ Temp: 98.3 F (36.8 C) 98  F (36.7 C) 98.3 F (36.8 C) 98.5 F (36.9 C)  TempSrc: Oral Oral Oral Oral  SpO2: 96% 96% 94% 95%  Weight:      Height:        Intake/Output Summary (Last 24 hours) at 08/29/2021 1620 Last data filed at 08/29/2021 0600 Gross per 24 hour  Intake 5 ml  Output 550 ml  Net -545 ml   Filed Weights   08/20/21 2200  Weight: 72.8 kg    Examination:  General exam: Alert.  Dry mucous membranes.  Oral thrush.   Respiratory system: Lungs clear to auscultation bilaterally.  No wheezes, no crackles, no rhonchi.  Fair air movement.  Speaking in full sentences.   Cardiovascular system: RRR no murmurs rubs or gallops.  No JVD.  1-2+ bilateral lower extremity edema.  Gastrointestinal system: Abdomen is nondistended, soft and nontender. No organomegaly or masses felt. Normal bowel sounds heard.  Cholecystostomy tube noted right upper quadrant with drain in place.  Central nervous system: Patient alert today to self and place.  Moving extremities spontaneously.   Extremities: 1-2+ bilateral lower extremity edema.  Skin: No rashes, lesions or ulcers Psychiatry: Judgement and insight appear fair.  Mood and affect flat    Data Reviewed:   CBC: Recent Labs  Lab 08/23/21 0456 08/24/21 0342 08/25/21 0159 08/26/21 0049 08/27/21 0450 08/28/21 0053 08/29/21 1252  WBC 4.9 8.0 9.3 7.9 7.0 8.1 8.5  NEUTROABS 3.4  6.4  --   --  5.2 6.5 6.3  HGB 9.2* 9.8* 10.4* 10.5* 9.6* 8.8* 9.1*  HCT 28.5* 29.7* 31.9* 32.7* 29.3* 27.1* 27.8*  MCV 98.3 96.4 97.0 97.6 96.7 97.1 98.2  PLT 103* 105* 98* 109* 98* 98* 117*    Basic Metabolic Panel: Recent Labs  Lab 08/24/21 0342 08/25/21 0159 08/26/21 0049 08/27/21 0450 08/28/21 0053 08/29/21 1252  NA 134* 132* 135 135 134* 135  K 3.4* 3.9 3.9 4.3 4.4 4.2  CL 107 105 105 107 106 107  CO2 '22 23 22 '$ 21* 20* 21*  GLUCOSE 129* 154* 170* 151* 121* 114*  BUN 40* 40* 42* 40* 41* 42*  CREATININE 1.55* 1.65* 1.72* 1.71* 1.66* 1.54*  CALCIUM 8.1* 7.9* 7.8* 7.7*  8.1* 8.1*  MG  --  2.0  --  2.0  --   --   PHOS 3.1 3.1  --  3.6 4.0  --     GFR: Estimated Creatinine Clearance: 28.8 mL/min (A) (by C-G formula based on SCr of 1.54 mg/dL (H)).  Liver Function Tests: Recent Labs  Lab 08/23/21 0456 08/24/21 0342 08/25/21 0159 08/27/21 0450 08/28/21 0053  AST 205*  --  46*  --   --   ALT 35  --  23  --   --   ALKPHOS 637*  --  593*  --   --   BILITOT 1.6*  --  0.7  --   --   PROT 5.3*  --  5.2*  --   --   ALBUMIN 2.6* 2.1* 1.9* 2.0* 2.5*    CBG: No results for input(s): "GLUCAP" in the last 168 hours.   Recent Results (from the past 240 hour(s))  Urine Culture     Status: Abnormal   Collection Time: 08/23/21  8:22 AM   Specimen: Urine, Catheterized  Result Value Ref Range Status   Specimen Description URINE, CATHETERIZED  Final   Special Requests NONE  Final   Culture (A)  Final    >=100,000 COLONIES/mL ENTEROCOCCUS FAECIUM 20,000 COLONIES/mL ACHROMOBACTER XYLOSOXIDANS MULTI-DRUG RESISTANT ORGANISM NOTIFIED DR. [facility] VIA EPIC CHAT REGARDING RESITANT SUSCEPTIBILITY PATTERN Performed at [facility], [place] [address], [place], [place] [address]    Report Status 08/29/2021 FINAL  Final   Organism ID, Bacteria ENTEROCOCCUS FAECIUM (A)  Final   Organism ID, Bacteria ACHROMOBACTER XYLOSOXIDANS (A)  Final      Susceptibility   Enterococcus faecium - MIC*    AMPICILLIN >=32 RESISTANT Resistant     LEVOFLOXACIN 2 SENSITIVE Sensitive     NITROFURANTOIN 128 RESISTANT Resistant     VANCOMYCIN <=0.5 SENSITIVE Sensitive     GENTAMICIN SYNERGY SENSITIVE Sensitive     * >=100,000 COLONIES/mL ENTEROCOCCUS FAECIUM   Achromobacter xylosoxidans - MIC*    CEFAZOLIN >=64 RESISTANT Resistant     GENTAMICIN >=16 RESISTANT Resistant     CIPROFLOXACIN >=4 RESISTANT Resistant     IMIPENEM 8 INTERMEDIATE Intermediate     TRIMETH/SULFA <=20 SENSITIVE Sensitive     * 20,000 COLONIES/mL ACHROMOBACTER XYLOSOXIDANS  Urine Culture     Status: Abnormal    Collection Time: 08/26/21  8:07 AM   Specimen: Urine, Clean Catch  Result Value Ref Range Status   Specimen Description URINE, CLEAN CATCH  Final   Special Requests   Final    NONE Performed at [facility], [place] [address] [place], [place] [address]    Culture MULTIPLE SPECIES PRESENT, SUGGEST RECOLLECTION (A)  Final  Report Status 08/28/2021 FINAL  Final         Radiology Studies: No results found.      Scheduled Meds:  amLODipine  5 mg Oral Daily   apixaban  5 mg Oral BID   bisacodyl  10 mg Rectal Once   ciprofloxacin  500 mg Oral Q breakfast   dronabinol  5 mg Oral BID AC   levothyroxine  125 mcg Oral QAC breakfast   metroNIDAZOLE  500 mg Oral Q12H   multivitamin with minerals  1 tablet Oral Daily   nystatin  5 mL Oral QID   pantoprazole  40 mg Oral Daily   polyethylene glycol  17 g Oral Daily   sodium chloride flush  5 mL Intracatheter Daily   sodium chloride flush  5 mL Intracatheter Daily   [START ON 09/01/2021] thiamine injection  100 mg Intravenous Daily   Continuous Infusions:  albumin human 25 g (08/29/21 1052)   thiamine injection 250 mg (08/29/21 1436)     LOS: 9 days    Time spent: 35 minutes    Irine Seal, MD [facility]   To contact the attending provider between 7A-7P or the covering provider during after hours 7P-7A, please log into the web site [email or web address] and access using universal [place] password for that web site. If you do not have the password, please call the hospital operator.  08/29/2021, 4:20 PM

## 2021-08-30 DIAGNOSIS — N179 Acute kidney failure, unspecified: Secondary | ICD-10-CM | POA: Diagnosis not present

## 2021-08-30 DIAGNOSIS — E860 Dehydration: Secondary | ICD-10-CM | POA: Diagnosis not present

## 2021-08-30 DIAGNOSIS — E039 Hypothyroidism, unspecified: Secondary | ICD-10-CM | POA: Diagnosis not present

## 2021-08-30 DIAGNOSIS — E871 Hypo-osmolality and hyponatremia: Secondary | ICD-10-CM | POA: Diagnosis not present

## 2021-08-30 DIAGNOSIS — K819 Cholecystitis, unspecified: Secondary | ICD-10-CM | POA: Diagnosis not present

## 2021-08-30 LAB — RENAL FUNCTION PANEL
Albumin: 2.8 g/dL — ABNORMAL LOW (ref 3.5–5.0)
Anion gap: 7 (ref 5–15)
BUN: 44 mg/dL — ABNORMAL HIGH (ref 8–23)
CO2: 21 mmol/L — ABNORMAL LOW (ref 22–32)
Calcium: 8.1 mg/dL — ABNORMAL LOW (ref 8.9–10.3)
Chloride: 109 mmol/L (ref 98–111)
Creatinine, Ser: 1.54 mg/dL — ABNORMAL HIGH (ref 0.44–1.00)
GFR, Estimated: 34 mL/min — ABNORMAL LOW (ref >60)
Glucose, Bld: 119 mg/dL — ABNORMAL HIGH (ref 70–99)
Phosphorus: 3.9 mg/dL (ref 2.5–4.6)
Potassium: 4.4 mmol/L (ref 3.5–5.1)
Sodium: 137 mmol/L (ref 135–145)

## 2021-08-30 LAB — CBC
HCT: 23.2 % — ABNORMAL LOW (ref 36.0–46.0)
Hemoglobin: 7.6 g/dL — ABNORMAL LOW (ref 12.0–15.0)
MCH: 31.7 pg (ref 26.0–34.0)
MCHC: 32.8 g/dL (ref 30.0–36.0)
MCV: 96.7 fL (ref 80.0–100.0)
Platelets: 110 10*3/uL — ABNORMAL LOW (ref 150–400)
RBC: 2.4 MIL/uL — ABNORMAL LOW (ref 3.87–5.11)
RDW: 19.6 % — ABNORMAL HIGH (ref 11.5–15.5)
WBC: 7.2 10*3/uL (ref 4.0–10.5)
nRBC: 0 % (ref 0.0–0.2)

## 2021-08-30 NOTE — TOC CM/SW Note (Signed)
Patient cannot change body position in bed without assistance .

## 2021-08-30 NOTE — Progress Notes (Signed)
Hydrologist [facility]) Hospital Liaison: RN note    Notified by Transition of Care Manger of patient/family request for [facility] services at home after discharge. Chart and patient information under review by [facility] physician. Hospice eligibility pending currently.    Writer spoke with  [name] to initiate education related to hospice philosophy, services and team approach to care. [name] verbalized understanding of information given. Per discussion, plan is for discharge to home by  [facility] tomorrow.  Please send signed and completed DNR form home with patient/family. Patient will need prescriptions for discharge comfort medications.     DME needs have been discussed. Patient/family requests the following DME for delivery to the home: Hospital bed, with overlay which has already been ordered and will be delivered today by [employer] per TOC . Home address has been verified and is correct in the chart. [name] is the family member to contact to arrange time of delivery.     [facility] aware of the above. Please notify [facility] when patient is ready to leave the unit at discharge. (Call [phone] or [phone] after 5pm.) [facility] information and contact numbers given to [name].      Please call with any hospice related questions.     Thank you for this referral.     Clementeen Hoof, DNP, [facility] (listed on AMION under Hospice and [place] [facility] [place]  [phone]

## 2021-08-30 NOTE — Progress Notes (Signed)
OT Cancellation Note  Patient Details Name: [name] MRN: [id number] DOB: 05-12-1945   Cancelled Treatment:    Reason Eval/Treat Not Completed: Fatigue/lethargy limiting ability to participate;Patient's level of consciousness: Sleeping deeply and did not awaken to knock and name call. Allowing pt to rest.   Julien Girt 08/30/2021, 12:20 PM

## 2021-08-30 NOTE — Progress Notes (Signed)
PT Cancellation Note  Patient Details Name: [name] MRN: [id number] DOB: 01-26-1946   Cancelled Treatment:    Reason Eval/Treat Not Completed: Fatigue/lethargy limiting ability to participate;Pain limiting ability to participate.  Follow up to consider if dc is appropriate tomorrow.   Ramond Dial 08/30/2021, 3:51 PM  Mee Hives, PT PhD Acute Rehab Dept. Number: [place] and [place]

## 2021-08-30 NOTE — Progress Notes (Signed)
Hydrologist [facility])  Hospital Liaison: RN note       Notified by [facility] manager of patient/family request for [facility] Palliative services at home after discharge. Patient will go home with [place] home health.             [place] Palliative team will follow up with patient after discharge.       Please call with any hospice or palliative related questions.       Thank you for this referral.       Clementeen Hoof, DNP, RN [place] (listed on AMION under Hospice and [place] [facility] [place]  [phone]

## 2021-08-30 NOTE — Progress Notes (Signed)
  Percutaneous chole drain placed 07/20/21  Drain Location: RUQ Size: Fr size: 10 Fr Date of placement: 07/20/21  Currently to: Drain collection device: gravity 24 hour output:  Output by Drain (mL) 08/28/21 0701 - 08/28/21 1900 08/28/21 1901 - 08/29/21 0700 08/29/21 0701 - 08/29/21 1900 08/29/21 1901 - 08/30/21 0700 08/30/21 0701 - 08/30/21 1011  Closed System Drain RUQ 10.2 Fr.  50  5    Current examination: Chole drain Flushes/aspirates easily.  Insertion site unremarkable. Suture and stat lock in place. Dressed appropriately.  OP bilious   Discharge planning: Please contact IR APP or on call IR MD prior to patient d/c to ensure appropriate follow up plans are in place. Typically patient will follow up with IR clinic 8 weeks post d/c for repeat imaging/possible drain injection. IR scheduler will contact patient with date/time of appointment. Patient will need to flush drain QD with 5 cc NS, record output QD, dressing changes every 2-3 days or earlier if soiled.  Please be sure to provide Rx for sterile saline flushes for pt upon DC   IR will continue to follow - please call with questions or concerns.

## 2021-08-30 NOTE — TOC Progression Note (Addendum)
Transition of Care [facility]) - Progression Note    Patient Details  Name: MYRTH DAHAN MRN: [id number] Date of Birth: 02-12-1946  Transition of Care [facility]) CM/SW Contact  Jacalyn Lefevre Edson [place], RN Phone Number: 08/30/2021, 10:24 AM  Clinical Narrative:     Plan for discharge to home tomorrow.   MD asking if home health can do voiding trail . NCM spoke to [place] with [place] , Home health is unable to do voiding trial  at home.   NCM received message [name] wants to talk to [facility].   NCM went to room. Patient and husband present at bedside.   Discussed above. Patient consented for NCM to call [name] . NCM called and left voicemail await call back.   Talked with [employer] for air overlay mattress, [employer] said insurance requires patient to have Stage 3 pressure ulcer on her trunk or sacrum or a MD note stating patient cannot change body position in bed without assistance . Secure chatted bedside nurse and MD   1220 Meli returned call. Discussed home health ,  palliative care and hospice. Meli in agreement for home health and palliative care. Alvis Lemmings and [facility] will give [name] a call. Also discussed DME ordered hospital bed and air overlay mattress. [employer] will call [name] today to arrange delivery today. Patient will need ambulance transport home tomorrow. Confirmed address on face sheet    1530 [name] at bedside, she has not heard from [place] or [facility]. NCM called Tommi Rumps with [facility] , he will call [name] now.  NCM spoke to [facility] with Lonia Chimera, Melissa will call [name] this afternoon. [name] would like to speak with [employer]. NCM called Letta Kocher , [employer] will reach back out to [name] , they have delivery scheduled for tomorrow.  Laurie called back , patient and daughter have agreed to hospice. NCM cancelled Alvis Lemmings , and updated Melissa with [facility] and [employer] will deliver DME today   Expected Discharge Plan: [place] Barriers to Discharge: No Barriers  Identified  Expected Discharge Plan and Services Expected Discharge Plan: [place]   Discharge Planning Services: CM Consult Post Acute Care Choice: Durable Medical Equipment, Home Health Living arrangements for the past 2 months: Single Family Home                 DME Arranged: Hospital bed, Walker rolling DME Agency: [employer] Date DME Agency Contacted: 08/22/21 Time DME Agency Contacted: [id number] Representative spoke with at DME Agency: Alecia Lemming HH Arranged: RN, PT, OT, Nurse's Aide [place] Agency: [place] Date [place]: 08/22/21 Time [place]: [phone] Representative spoke with at [place]: [place] ([place]) Interventions    Readmission Risk Interventions     No data to display

## 2021-08-30 NOTE — Progress Notes (Signed)
HEMATOLOGY-ONCOLOGY PROGRESS NOTE  ASSESSMENT AND PLAN: Adenocarcinoma the pancreas body, stage IV (cT4,cN0,pM1) Ultrasound abdomen 08/18/2020-possible hypoechoic pancreas body mass, small hypoechoic liver areas MRI abdomen 08/27/2020-pancreas body mass, multiple hepatic lesions consistent with metastases, right abdominal omental nodularity, pancreas mass extends to the celiac bifurcation and abuts the splenic vein and splenoportal confluence CT chest 09/07/2020-scattered pulmonary nodules concerning for metastases, pancreas body mass, hepatic metastases Ultrasound-guided biopsy of a right liver lesion 09/08/2020-adenocarcinoma consistent with a pancreas primary CT abdomen/pelvis 09/24/2020-pancreas neck mass, omental and peritoneal nodularity-unchanged, multiple hypoenhancing liver masses-unchanged, numerous small pulmonary nodules Cycle 1 gemcitabine/Abraxane 10/04/2020 Cycle 2 gemcitabine/Abraxane 10/27/2020 Cycle 3 gemcitabine/Abraxane 11/11/2018 Cycle 4 gemcitabine/Abraxane 11/23/2020 Cycle 5 gemcitabine/Abraxane 12/08/2020 CT abdomen/pelvis 12/18/2020-stable pancreas mass, stable and improved liver lesions, resolution of omental soft tissue density, stable indeterminate lung nodules, no disease progression Cycle 6 gemcitabine/Abraxane 12/22/2020 Cycle 7 gemcitabine 01/05/2021, 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  Cycle 8 gemcitabine/Abraxane 01/19/2021 Cycle 9 gemcitabine/Abraxane 02/01/2021 Cycle 10 Gemcitabine 02/16/2021, Abraxane held due to increased neuropathy Cycle 11 Gemcitabine 03/05/2021, 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 Cycle 12 gemcitabine 03/14/2021, 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 Cycle 13 gemcitabine 04/02/2021, 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 Cycle 14 gemcitabine/Abraxane 04/16/2021, Abraxane resumed at a reduced dose Cycle 15 gemcitabine/Abraxane 04/30/2021 CT abdomen/pelvis 05/08/2021-stable pancreas mass, liver metastases are slightly smaller, stable tiny bilateral lower lung nodules Cycle  16 gemcitabine/Abraxane 05/14/2021, Abraxane escalated back to 100 mg per metered squared Cycle 17 gemcitabine/Abraxane 05/28/2021 Cycle 18 gemcitabine/Abraxane 06/11/2021 Cycle 19 gemcitabine/Abraxane 07/09/2021 CT Abdo/pelvis 07/18/2021-diffuse gallbladder wall thickening and edema, small volume ascites, stable pulmonary nodules, indeterminate 15 x 18 mm right liver lesion, ill-defined hypodensity in the body of the pancreas 07/24/2021-right pleural fluid cytology-negative   2.  Pain secondary #1 3.  Diabetes 4.  Hypertension 5.  Osteoarthritis 6.  Port-A-Cath placement 09/22/2020 7.  Admission with jaundice 09/24/2020.  MRI-new abrupt constriction of the common hepatic duct.  The infiltrative pancreatic mass extends medially in the porta hepatis to obstruct the common hepatic duct.  Multiple right hepatic lobe metastases mildly increased in size.  Stent placed into the common bile duct 09/28/2020. 8.  COVID-19 06/20/2021, admission with COVID and bacterial pneumonia?  06/30/2021-completed course of antibiotics 9.  Hospital admission 07/18/2021-bacteremia, UTI, acute cholecystitis Cholecystostomy tube 07/20/2021 CT abdomen/pelvis 07/24/2021-moderate right pleural effusion, new subcapsular fluid collections adjacent to the right liver, interval cholecystostomy tube, stable pancreas body mass, incidental pulmonary embolism and a left lower lobe pulmonary artery Drain in perihepatic fluid collection 07/27/2021 10.  Pulmonary embolism/bilateral DVTs CT abdomen/pelvis 07/24/2021-incidental left lower lobe pulmonary embolism CT chest 07/24/2021-segmental and subsegmental bilateral pulmonary emboli Dopplers 07/24/2021-DVTs in the right common femoral, left common femoral, left profunda veins Heparin 07/24/2021, converted to apixaban on discharge 08/01/2021 11.  Bilateral foot numbness developing during hospital admission May 2023-Abraxane neuropathy? [facility] admission 08/20/2021-dehydration, weakness  Ms. Winslett  continues to be very weak.  She is bedbound.  She is not participating in physical therapy.  I discussed the poor prognosis with Ms. Tapscott this morning.  She told me she was agreeable to home hospice care.  I spoke to her daughter this afternoon.  She reports Ms. Hersh has decided against hospice and would like to go home with home health "and palliative care.  Her daughter is agreeable to hospice care if that Ms. Zawistowski agrees.  She will have further discussions with her mother this afternoon.   Recommendations: 1.  Home with hospice care if/when Ms. Johann agrees 2.  I will plan  to follow her with the home hospice team  Betsy Coder   SUBJECTIVE: [name] remains quite weak.  She declined physical therapy yesterday.   Oncology History Overview Note  Cancer Staging Primary pancreatic cancer with metastasis to other site [facility]) Staging form: Exocrine Pancreas, AJCC 8th Edition - Clinical stage from 09/08/2020: Stage IV (cT4, cN0, pM1) - Signed by Truitt Merle, MD on 09/12/2020 Stage prefix: Initial diagnosis Total positive nodes: 0    Primary pancreatic cancer with metastasis to other site ([place])  08/27/2020 Imaging   Abdominal MRI w wo contrast: IMPRESSION: 1. Pancreatic body infiltrative mass, favoring adenocarcinoma. 2. Multiple hepatic lesions, most consistent with metastasis. 3. Right abdominal omental nodularity, highly suspicious for peritoneal metastasis. 4. Recommend multidisciplinary oncology consultation for eventual sampling of either the liver or pancreatic lesions. 5. Vascular involvement with tumor, as detailed above.   09/07/2020 Imaging   CT chest  IMPRESSION: 1. Scattered small pulmonary nodules, worrisome for metastatic disease. 2. Pancreatic body mass and hepatic metastases, better seen and described on MR abdomen 08/27/2020. 3. Aortic atherosclerosis (ICD10-I70.0). Coronary artery calcification.   09/08/2020 Cancer Staging   Staging form: Exocrine  Pancreas, AJCC 8th Edition - Clinical stage from 09/08/2020: Stage IV (cT4, cN0, pM1) - Signed by Truitt Merle, MD on 09/12/2020 Stage prefix: Initial diagnosis Total positive nodes: 0   09/08/2020 Pathology Results   A. LIVER, RIGHT, BIOPSY:  - Adenocarcinoma.  See comment.   COMMENT:   The morphology is consistent with a pancreatic primary.    09/12/2020 Initial Diagnosis   Primary pancreatic cancer with metastasis to other site [facility])   10/12/2020 -  Chemotherapy   Patient is on Treatment Plan : PANCREATIC Abraxane / Gemcitabine D1,8,15 q28d       PHYSICAL EXAMINATION:  Vitals:   08/30/21 0528 08/30/21 0808  BP: 126/78 (!) 109/55  Pulse: (!) 104 100  Resp: 20 16  Temp: 97.8 F (36.6 C) (!) 97.5 F (36.4 C)  SpO2: 92% 96%   Filed Weights   08/20/21 2200  Weight: 160 lb 7.9 oz (72.8 kg)    Intake/Output from previous day: 06/14 0701 - 06/15 0700 In: 672.5 [P.O.:50; IV Piggyback:622.5] Out: 20 [Urine:800; Drains:5]  Exam: Awake, conversant HEENT-no thrush  Abdomen-two right upper abdomen drains, no mass, mildly distended Vascular-pitting edema of the left greater than right arm, and legs-improved  LABORATORY DATA:  I have reviewed the data as listed    Latest Ref Rng & Units 08/30/2021    1:00 AM 08/29/2021   12:52 PM 08/28/2021   12:53 AM  CMP  Glucose 70 - 99 mg/dL 119  114  121   BUN 8 - 23 mg/dL 44  42  41   Creatinine 0.44 - 1.00 mg/dL 1.54  1.54  1.66   Sodium 135 - 145 mmol/L 137  135  134   Potassium 3.5 - 5.1 mmol/L 4.4  4.2  4.4   Chloride 98 - 111 mmol/L 109  107  106   CO2 22 - 32 mmol/L '21  21  20   '$ Calcium 8.9 - 10.3 mg/dL 8.1  8.1  8.1     Lab Results  Component Value Date   WBC 7.2 08/30/2021   HGB 7.6 (L) 08/30/2021   HCT 23.2 (L) 08/30/2021   MCV 96.7 08/30/2021   PLT 110 (L) 08/30/2021   NEUTROABS 6.3 08/29/2021    Lab Results  Component Value Date   CAN199 16,591 (H) 08/09/2021    US RENAL  Result Date: 08/26/2021 CLINICAL  DATA:  Renal dysfunction EXAM: RENAL / URINARY TRACT ULTRASOUND COMPLETE COMPARISON:  08/21/2021 FINDINGS: Right Kidney: Renal measurements: 10.6 x 5.1 x 5.9 cm = volume: 168 mL. There is no hydronephrosis. There is increased cortical echogenicity. There is 3.5 cm cyst in the upper pole. There is 1.6 cm cyst in the midportion. Left Kidney: Renal measurements: 9.4 x 4.7 x 5.4 cm = volume: 124 mL. There is no hydronephrosis. There is increased cortical echogenicity. Bladder: Appears normal for degree of bladder distention. Other: Small ascites is seen. Minimal amount of perinephric fluid collection may be part of ascites. Spleen appears to be enlarged in size. There is possible right pleural effusion. IMPRESSION: There is no hydronephrosis. There is increased cortical echogenicity in both kidneys suggesting medical renal disease. Right renal cysts. Ascites.  Right pleural effusion. Electronically Signed   By: Elmer Picker M.D.   On: 08/26/2021 12:01   EEG adult  Result Date: 08/23/2021 Lora Havens, MD     08/23/2021  8:07 PM Patient Name: KYLYN MCDADE MRN: [id number] Epilepsy Attending: Lora Havens Referring Physician/Provider: Eugenie Filler, MD Date: 08/23/2021 Duration: 28.24 mins Patient history: 76yo F with ams and continuous jerking movements of both arms. EEG to evaluate for seizure. Level of alertness: lethargic AEDs during EEG study: None Technical aspects: This EEG study was done with scalp electrodes positioned according to the 10-20 International system of electrode placement. Electrical activity was acquired at a sampling rate of '500Hz'$  and reviewed with a high frequency filter of '70Hz'$  and a low frequency filter of '1Hz'$ . EEG data were recorded continuously and digitally stored. Description: No clear posterior dominant rhythm was seen. EEG showed continuous generalized polymorphic 3-'5Hz'$  theta-delta slowing. Generalized periodic discharges with triphasic morphology at 1-1.'5Hz'$  were  also noted, more prominent when awake/stimulated. Hyperventilation and photic stimulation were not performed.    ABNORMALITY - Periodic discharges with triphasic morphology, generalized ( GPDs) - Continuous slow, generalized  IMPRESSION: This study showed generalized periodic discharges with triphasic morphology. This EEG pattern is on the ictal-interictal continuum. However the morphology, frequency and reactivity to stimulation is more commonly indicative of toxic-metabolic causes like hyperammonemia, cefepime toxicity. Additionally there is moderate to severe diffuse encephalopathy. No seizures were seen throughout the recording.  If patient remains altered, can consider prolonged EEG monitoring.  Priyanka Barbra Sarks    CT HEAD WO CONTRAST (5MM)  Result Date: 08/23/2021 CLINICAL DATA:  Altered mental status EXAM: CT HEAD WITHOUT CONTRAST TECHNIQUE: Contiguous axial images were obtained from the base of the skull through the vertex without intravenous contrast. RADIATION DOSE REDUCTION: This exam was performed according to the departmental dose-optimization program which includes automated exposure control, adjustment of the mA and/or kV according to patient size and/or use of iterative reconstruction technique. COMPARISON:  06/01/2016 FINDINGS: Brain: No acute intracranial findings are seen in noncontrast CT brain. There are no signs of bleeding. Cortical sulci are prominent. There is decreased density in the periventricular white matter. Vascular: Unremarkable. Skull: Unremarkable. Sinuses/Orbits: Unremarkable. Other: No significant interval changes are noted. IMPRESSION: No acute intracranial findings are seen in noncontrast CT brain. Atrophy. Small-vessel disease. Electronically Signed   By: Elmer Picker M.D.   On: 08/23/2021 13:11   CT ABDOMEN PELVIS W CONTRAST  Result Date: 08/21/2021 CLINICAL DATA:  Cholecystitis status post cholecystostomy tube placement. History of pancreatic cancer. EXAM: CT  ABDOMEN AND PELVIS WITH CONTRAST TECHNIQUE: Multidetector CT imaging of the abdomen and pelvis was performed using  the standard protocol following bolus administration of intravenous contrast. RADIATION DOSE REDUCTION: This exam was performed according to the departmental dose-optimization program which includes automated exposure control, adjustment of the mA and/or kV according to patient size and/or use of iterative reconstruction technique. CONTRAST:  121m OMNIPAQUE IOHEXOL 300 MG/ML  SOLN COMPARISON:  CT abdomen pelvis dated August 16, 2021. FINDINGS: Lower chest: Unchanged moderate right and small left pleural effusions with adjacent atelectasis in both posterior lower lobes. Hepatobiliary: Multiple hypodense liver lesions are unchanged. Right perihepatic drainage catheter remains in place without significant residual perihepatic fluid collection. 2.2 cm posteroinferior perihepatic fluid collection is unchanged (series 3, image 20). New small amount of pneumobilia in the left liver. Multiple gallstones again noted with slightly increased distention of the gallbladder status post cholecystostomy tube placement. Unchanged common bile duct stent. Pancreas: Unchanged hypodense mass in the pancreatic neck with atrophy of the body and tail. Spleen: Unchanged wedge-shaped hypodensity in the inferior spleen, likely a small infarct. Normal in size. Adrenals/Urinary Tract: Adrenal glands are unremarkable. Unchanged small right renal simple cysts. No follow-up imaging is recommended. No renal calculi or hydronephrosis. The bladder is distended with excreted contrast. Stomach/Bowel: Unchanged small hiatal hernia. The stomach is otherwise within normal limits. No bowel wall thickening, distention, or surrounding inflammatory changes. Scattered mild colonic diverticulosis. Similar moderate stool burden throughout the colon. Normal appendix. 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 Aortoiliac atherosclerotic vascular disease. No enlarged abdominal or pelvic lymph nodes. Reproductive: Status post hysterectomy. No adnexal masses. Other: Decreased small volume ascites in the pelvis. No pneumoperitoneum. Musculoskeletal: No acute or significant osseous findings. Unchanged anasarca. IMPRESSION: 1. Status post cholecystostomy tube placement with slightly increased distention of the gallbladder compared to prior study. Correlate with tube function. 2. Right perihepatic drainage catheter remains in place without significant residual perihepatic fluid collection around the drain. Unchanged 2.2 cm posteroinferior perihepatic fluid collection separate from the drain. 3. 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 4. Decreased small volume ascites in the pelvis. 5. Unchanged moderate right and small left pleural effusions. 6. 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 7. Aortic Atherosclerosis (ICD10-I70.0). Electronically Signed   By: WTitus DubinM.D.   On: 08/21/2021 17:58   [place]  Result Date: 08/21/2021 CLINICAL DATA:  Acute kidney injury EXAM: RENAL / URINARY TRACT ULTRASOUND COMPLETE COMPARISON:  CT abdomen 08/16/2021 FINDINGS: Right Kidney: Renal measurements: 9.5 by 4.0 by 5.0 cm = volume: 102 mL. Mildly hyperechoic relative to the liver. No solid mass or hydronephrosis visualized. A right kidney upper pole cyst measures 2.0 by 3.0 by 2 2.6 cm and appears simple. Left Kidney: Renal measurements: 9.7 by 4.0 by 4.8 cm = volume: 99 mL. Mildly hyperechoic relative to the spleen. No mass or hydronephrosis visualized. Bladder: Appears normal for degree of bladder distention. A left ureteral jet is visualized. Ascites in the lower abdomen. IMPRESSION: 1. Bilateral renal parenchymal  hyperechogenicity suggesting chronic medical renal disease. 2. Right kidney upper pole simple appearing cyst. 3. A left ureteral jet is visualized in the urinary bladder. Right ureteral jet not seen, although this may simply be incidental. 4. Pelvic ascites. Electronically Signed   By: WVan ClinesM.D.   On: 08/21/2021 16:00   DG CHEST PORT 1 VIEW  Result Date: 08/20/2021 CLINICAL DATA:  Status post  PICC line placement. EXAM: PORTABLE CHEST 1 VIEW COMPARISON:  07/27/2021. FINDINGS: The heart size and mediastinal contours are within normal limits. There is atherosclerotic calcification of the aorta. Patchy airspace disease is noted at the right lung base. There are questionable small bilateral pleural effusions. No pneumothorax. A right chest port is stable. No PICC line is identified. A stable pigtail catheter is noted in the right upper quadrant. No acute osseous abnormality. IMPRESSION: 1. Small bilateral pleural effusions with atelectasis at the lung bases. 2. No PICC line is identified.  Clinical correlation is recommended. Electronically Signed   By: Brett Fairy M.D.   On: 08/20/2021 23:45   CT ABDOMEN PELVIS W CONTRAST  Result Date: 08/17/2021 CLINICAL DATA:  Abdominal pain, acute, nonlocalized EXAM: CT ABDOMEN AND PELVIS WITH CONTRAST TECHNIQUE: Multidetector CT imaging of the abdomen and pelvis was performed using the standard protocol following bolus administration of intravenous contrast. RADIATION DOSE REDUCTION: This exam was performed according to the departmental dose-optimization program which includes automated exposure control, adjustment of the mA and/or kV according to patient size and/or use of iterative reconstruction technique. CONTRAST:  51m OMNIPAQUE IOHEXOL 300 MG/ML  SOLN COMPARISON:  08/15/2021 FINDINGS: Lower chest: Small bilateral pleural effusions. Compressive atelectasis in the lower lobes. Findings stable since prior study. Hepatobiliary: Right perihepatic drainage  catheter remains in place with near complete resolution of perihepatic fluid collection. Inferior perihepatic fluid collection again noted, measuring 2.3 cm, stable. Numerous hypodensities throughout the liver are again seen and unchanged compatible with metastases. Cholecystostomy tube in place. Gallbladder decompressed. Gallstone noted within the gallbladder. Common bile duct stent noted, unchanged. Pancreas: Ill-defined hypodensity in the body of the pancreas compatible with known pancreatic malignancy. Pancreatic tail atrophy. Spleen: No focal abnormality.  Normal size. Adrenals/Urinary Tract: Right upper pole renal cyst. No hydronephrosis. Adrenal glands and urinary bladder unremarkable. Stomach/Bowel: Large stool burden in the colon. Sigmoid diverticulosis. Stomach and small bowel decompressed. Vascular/Lymphatic: Aortoiliac atherosclerosis. No adenopathy or aneurysm. Reproductive: Prior hysterectomy.  No adnexal masses. Other: Moderate free fluid in the pelvis, unchanged. Musculoskeletal: No acute bony abnormality. IMPRESSION: Essentially stable CT since 2 days prior. Small bilateral pleural effusions with compressive atelectasis. Numerous metastases within the liver, stable. Perihepatic fluid collections are stable. Drainage catheter within a right perihepatic fluid collection and in the gallbladder. Pancreatic body mass compatible with known pancreatic malignancy. Pancreatic tail atrophy. Large stool burden in the colon. Aortic atherosclerosis. Moderate free fluid in the pelvis. Electronically Signed   By: KRolm BaptiseM.D.   On: 08/17/2021 02:22   CT ABDOMEN PELVIS W CONTRAST  Result Date: 08/15/2021 CLINICAL DATA:  Briefly, 76year old female with a history of pancreatic tumor, perihepatic fluid collection and acute cholecystitis s/p perihepatic drain and cholecystostomy drain placement. EXAM: CT ABDOMEN AND PELVIS WITH CONTRAST TECHNIQUE: Multidetector CT imaging of the abdomen and pelvis was  performed using the standard protocol following bolus administration of intravenous contrast. RADIATION DOSE REDUCTION: This exam was performed according to the departmental dose-optimization program which includes automated exposure control, adjustment of the mA and/or kV according to patient size and/or use of iterative reconstruction technique. CONTRAST:  1030mISOVUE-300 IOPAMIDOL (ISOVUE-300) INJECTION 61% COMPARISON:  IR fluoroscopy, earlier same day.  CT AP, 07/24/2021. FINDINGS: Lower chest: Persistent small volume bilateral pleural effusions, RIGHT-greater-than LEFT, and adjacent dependent consolidations Hepatobiliary: *Well-positioned percutaneous drainage catheter with near-complete resolution of previously large RIGHT perihepatic fluid collection. *Trace residual collection along the hepatic dome measures up to 5.0 x 1.0 cm, previously 17 cm. *  Additional inferomedial perihepatic fluid collection measures 3.0 x 1.7 cm, previously 6 cm *Multifocal hypodense lesions within the LEFT and RIGHT hepatic lobes, largest at hepatic segment VI measuring 3.5 x 2.5 cm, suspicious for hepatic metastases. *Stable positioning of cholecystostomy drain with contracted gallbladder. Metallic biliary stent. No intra or extrahepatic biliary ductal dilation. Pancreas: Normal contrast enhancement of the pancreatic head. Similar ill-defined hypodensity within the body of pancreas and pancreatic tail atrophy. Findings compatible with known pancreatic malignancy Spleen: New area of hypodensity within the inferior splenic pole, in an area measuring 3.0 x 2.0 cm. Adrenals/Urinary Tract: Adrenal glands are unremarkable. RIGHT exophytic and renal cortical renal cysts, unchanged. The LEFT kidney is normal. No renal calculi or hydronephrosis. Bladder is unremarkable. Stomach/Bowel: Small hiatus hernia. Nonobstructed bowel. Severe burden of sigmoid diverticulosis. No evidence of bowel wall thickening, distention, or inflammatory changes.  Vascular/Lymphatic: Aortic atherosclerosis without aneurysmal dilation. No enlarged abdominal or pelvic lymph nodes. Reproductive: Status post hysterectomy. No adnexal mass. Other: Small volume of residual ascites within the dependent pelvis. Musculoskeletal: Mild body wall edema. Multilevel spinal degenerative change. No interval osseous abnormality. IMPRESSION: Since CT AP dated 07/24/2021; 1. Well-positioned percutaneous drainage catheter with near-complete resolution of previously large perihepatic free fluid collection. Small volume residual inferomedial hepatic fluid collection. 2. Stable positioning of cholecystostomy drain with contracted gallbladder. 3. Metallic biliary stent without intra- or extrahepatic biliary ductal dilation. 4. Pancreatic body mass consistent with known malignancy. Increased conspicuity of multifocal hepatic lesions, as above, is suspicious for metastases. 5. New hypodensity within the inferior splenic pole, suspicious for regional hypoperfusion and splenic infarct. 6. Bilateral small volume pleural effusions, greater on RIGHT. Additional incidental, chronic and senescent findings as above. These results will be called to the ordering clinician or representative by the Radiologist Assistant, and communication documented in the PACS or [employer]. Electronically Signed   By: Michaelle Birks M.D.   On: 08/15/2021 17:41   DG Sinus/Fist Tube Chk-Non GI  Result Date: 08/15/2021 CLINICAL DATA:  Briefly, 76 year old female with a history of pancreatic tumor, perihepatic fluid collection and acute cholecystitis s/p perihepatic drain and cholecystostomy drain placement. EXAM: CHOLECYSTOSTOMY DRAIN INJECTION COMPARISON:  CT AP, concurrent and 07/24/2021.  IR CT, 07/25/2021. CONTRAST:  40 mL Omnipaque 300-administered via the existing cholecystostomy drain. FLUOROSCOPY TIME:  Fluoroscopic dose; 11.3 mGy TECHNIQUE: The patient was positioned supine on the fluoroscopy table. A preprocedural  spot fluoroscopic image was obtained of the RIGHT upper quadrant and the existing percutaneous cholecystostomy drainage catheter. Multiple spot fluoroscopic and radiographic images were obtained following the injection of a small amount of contrast via the existing percutaneous cholecystostomy catheter. A contrast injection for the perihepatic drainage catheter was not performed. Procedure performed by Pasty Spillers, IR PA FINDINGS: Cholecystostomy drain injection without evidence of cystic duct patency. Metallic biliary stent. IMPRESSION: No fluoroscopic evidence of cystic duct patency. The cholecystostomy drain was therefore LEFT in place. Electronically Signed   By: Michaelle Birks M.D.   On: 08/15/2021 16:41   IR Radiologist Eval & Mgmt  Result Date: 08/15/2021 Please refer to notes tab for details about interventional procedure. (Op Note)    Future Appointments  Date Time Provider Wanda  09/05/2021 10:00 AM Rosiland Oz, MD [facility]  10/17/2021  3:00 PM [name], MD [facility]  12/10/2021 11:45 AM [facility] HEALTH COACH [facility]      LOS: 10 days

## 2021-08-30 NOTE — Progress Notes (Addendum)
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 ([place])   Weakness   Decubitus skin ulcer   Acute metabolic encephalopathy   Cholecystitis   VTE (venous thromboembolism)   AKI (acute kidney injury) ([place])   Protein-calorie malnutrition, severe ([name])   Palliative care by specialist   Oral thrush   Acute urinary retention   Anasarca          Assessment: 76 YF direct admitted due to N/V and decreased PO intake   # Dehydration # Weakness 2/2 decrease PO intake  # Hx of Subcapsular fluid collection adjacent to the liver(17.6 cm) with Cx NG #Small perihepatic fluid collection(2.2cm)-separate from drain # Hx of Acute cholecystitis SP cholecystostomy  with IR on 5/5 with Cx with Morganella morganii/B ovatus and E coli  # Hx of  Morganella morganii and E coli  bacteremia on 5/3  #Adenocarcinoma of pancrease, 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 SP CT guided drainage of perihepatic fluid 5/10. Cx no growth. She was discharged on vanc and cefepime with EOT 08/20/21 -CT on 6/2 showed near complete resolution of previous large perihepatic free fluid collection. Small volume residual inferomedial hepatic fluid collection, measuring 2.3 cm,stable. . Plan was for capping trial and 2 week follow-up with IR.  -She presented ot ED on 6/1 with abdominal pain. CT(6/6) showed rt perihepatic drainage catheter in place with near complete resolution of perihepatic fluid collection. Slightly increased distension of gallbladder. Numerous liver mets.  Unchanged 2.2 cm posteroinferior perihepatic fluid collection separate from the drain.  -Seen by ID on 6/5, and found to have PO intolerance. She was direct admitted for IVF(did not want to fo to ED). She noted abdominal pain is getting better. -Pt had worsening mental status on 6/8, noted to received remaron/xanax. Neurology consulted and concerned with cefepime toxicity. Cefepime switched to ciprofloxacin. Pallative care on board, pending weekend progressio, may place hospice referral -IR consulted and perihepatic drain removed on 6/9, perc choli drain remains. -Oncology following and plan on discussing home hospice with family -Seen by general surgery(6/13) per distended gallbladder, no plans for OR. Perc choli drain needs to stay in place indefinitely unless cystic duct becomes patent.  -IR does not plan on aspirating 2.2 cm collection as it is unchanged, if further concerns recommend re-imaging.   Recommendations:  -D/C antibiotics. She has recieved about 5 weeks of antibiotics from hepatic drain placement on 5/10 (removed) and 2.2 cm fluid collection is  unchanged. Pt without fever and leukocytosis, agree that fluid collection does not appear to be clinically concerning. -ID appt with Manandhar on 6/21 -Follow goals of care  #Urine Cx+ E faecium -Represents asymptomatic bacteruria- pt has no urinary complaints. Repeat urine Cx on 6/11 show multiple species.    Microbiology:   Antibiotics: Vancomycin 5/3 Cefepime 5/3, 5/6-p Aztreonam 5/3, Ceftriaxone 5/4-5/6 Metronidazole 5/3-p     Cultures:   Prior hospitalization: 5/3 blood Cx+ 2/2 Morgenella Morgani and 1/2 Ecoli 5/5 GB abscess Cx+ Ecoli, morganella morganii and bacteroides ovatus(beta lactamase positive) 5/10liver abscess perihepatic collection with NG  SUBJECTIVE:  Resting in bed,No new complaints.  Feels weak.   Review of Systems: Review of Systems  All other systems reviewed and are negative.    Scheduled Meds:   amLODipine  5 mg Oral Daily   apixaban  5 mg Oral BID   bisacodyl  10 mg Rectal Once   dronabinol  5 mg Oral BID AC   levothyroxine  125 mcg Oral QAC breakfast   metroNIDAZOLE  500 mg Oral Q12H   multivitamin with minerals  1 tablet Oral Daily   nystatin  5 mL Oral QID   pantoprazole  40 mg Oral Daily   polyethylene glycol  17 g Oral Daily   sodium chloride flush  5 mL Intracatheter Daily   sodium chloride flush  5 mL Intracatheter Daily   [START ON 09/01/2021] thiamine injection  100 mg Intravenous Daily   Continuous Infusions:  albumin human 25 g (08/30/21 1042)   thiamine injection 250 mg (08/30/21 1222)   PRN Meds:.ALPRAZolam, docusate sodium, ibuprofen, iohexol, oxyCODONE, prochlorperazine, sodium chloride flush Allergies  Allergen Reactions   Penicillins Hives and Other (See Comments)    Tolerated Ceftriaxone 2023  Has patient had a PCN reaction causing immediate rash, facial/tongue/throat swelling, SOB or lightheadedness with hypotension: No Has patient had a PCN reaction causing severe rash involving mucus membranes or skin necrosis: No Has patient had a PCN reaction that required hospitalization No Has patient had a PCN reaction occurring within the last 10 years: No If all of the above answers are "NO", then may proceed with Cephalosporin use.    OBJECTIVE: Vitals:   08/29/21 1838 08/29/21 2103 08/30/21 0528 08/30/21 0808  BP: (!) 145/93 128/74 126/78 (!) 109/55  Pulse: (!) 101 95 (!) 104 100  Resp: '16 20 20 16  '$ Temp: 98.2 F (36.8 C) 98.2 F (36.8 C) 97.8 F (36.6 C) (!) 97.5 F (36.4 C)  TempSrc: Oral Oral Oral Oral  SpO2: 94% 95% 92% 96%  Weight:      Height:       Body mass index is 28.43 kg/m.  Physical Exam Constitutional:      Appearance: Normal appearance.  HENT:     Head: Normocephalic and atraumatic.     Right Ear: Tympanic membrane normal.     Left Ear: Tympanic membrane normal.     Nose: Nose normal.     Mouth/Throat:     Mouth: Mucous  membranes are moist.  Eyes:     Extraocular Movements: Extraocular movements intact.     Conjunctiva/sclera: Conjunctivae normal.     Pupils: Pupils are equal, round, and reactive to light.  Cardiovascular:     Rate and Rhythm: Normal rate and regular rhythm.     Heart sounds: No murmur heard.    No friction rub. No gallop.  Pulmonary:     Effort: Pulmonary effort is normal.     Breath sounds: Normal breath sounds.  Abdominal:     General: Abdomen is flat.     Palpations: Abdomen is soft.     Comments: ABD drains in place  Musculoskeletal:        General: Normal range of motion.  Skin:    General: Skin is warm and dry.  Neurological:     General: No focal deficit present.     Mental Status: She is oriented to person, place, and time.  Psychiatric:        Mood and Affect: Mood normal.       Lab Results Lab Results  Component Value Date   WBC 7.2 08/30/2021   HGB 7.6 (L) 08/30/2021   HCT 23.2 (L) 08/30/2021   MCV 96.7 08/30/2021   PLT 110 (L) 08/30/2021    Lab Results  Component Value Date   CREATININE 1.54 (H) 08/30/2021   BUN 44 (H) 08/30/2021   NA 137 08/30/2021   K 4.4 08/30/2021   CL 109 08/30/2021   CO2 21 (L) 08/30/2021    Lab Results  Component Value Date   ALT 23 08/25/2021   AST 46 (H) 08/25/2021   ALKPHOS 593 (H) 08/25/2021   BILITOT 0.7 08/25/2021        Laurice Record, [place] [facility] 08/30/2021, 2:29 PM

## 2021-08-30 NOTE — Progress Notes (Signed)
PROGRESS NOTE    [name]  IPJ:[id number] DOB: 1945/05/04 DOA: 08/20/2021 PCP: [name], MD    No chief complaint on file.   Brief Narrative:  [name] is a 76 y.o. female with medical history significant for acute cholecystitis status post cholecystostomy, history of pancreatic cancer, hypothyroidism, hypertension, generalized anxiety disorder, history of venous thromboembolic disease, recent hospitalization for sepsis with Morganella morganii/E. coli bacteremia who was sent to the hospital as a direct admission for evaluation of poor oral intake and severe dehydration. Patient's daughter states that since her discharge her oral intake has been very poor but over the last several days she has not had anything to eat or drink due to persistent nausea and vomiting despite taking antiemetics as well as poor appetite.  She complains of feeling very weak and tired and has been unable to get out of bed for the last 2 days.  She feels her legs are very heavy and unable to bear her weight.  She denies having any falls. She complains of abdominal pain mostly in the left lumbar area which she rates a 5 x 10 in intensity at its worst.  But denies having any changes in her bowel habits.  She denies having any fever, no chills, no urinary symptoms, no dizziness, no lightheadedness, no chest pain, no shortness of breath, no headache, no blurred vision or focal deficit. Her daughter is concerned about her progressive decline and inability to participate in physical therapy due to weakness.    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   -Encourage oral intake.   -Patient started to develop some volume overload with some lower extremity edema likely secondary to third spacing. -Saline lock IV fluids. -Follow.  Cholecystitis Status post cholecystostomy tube insertion In addition, had perihepatic drain placed Saw IR on 5/31 and plan for capping trial with return for imaging in 2 weeks Her symptoms seem to have worsened after this trial, unclear if this is related? CT 08/16/21 with stable CT, small bilateral effusions, numerous mets within liver, drainage catheter within right perihepatic fluid collection and in the gallbladder, pancreatic body mass c/w known pancreatic malignancy, large stool burden IR consulted to see about reattaching bags. With E. coli and Morganella morganii bacteremia Was on antibiotic therapy with cefepime and Flagyl. Patient with acute metabolic encephalopathy on 08/23/2021 and noted to be pocketing pills and also refusing some of her medications and as such oral Flagyl changed to IV Flagyl.  -Also due to concerns that cefepime may be contributing to metabolic encephalopathy cefepime changed to IV ciprofloxacin per ID.   -Patient transitioned to oral ciprofloxacin and oral Flagyl per ID. ID following and appreciate input and recommendations. -General surgery wrote a note that patient no longer a  candidate for systemic chemotherapy due to poor status, due to patient's functional status, poor prognosis per general surgery not a candidate for surgical intervention for the gallbladder and will need to keep  draining at this point indefinitely.  General surgery deferred to IR for further management. -Oral antibiotics discontinued per ID today as patient has received adequate antibiotic treatment of approximately 5 weeks per ID note.  Acute metabolic encephalopathy - Patient noted to be drowsy, confusion, noted to have some myoclonic jerking on 08/23/2021.   -Likely multifactorial secondary to medications including narcotics, benzos in the setting of AKI. -Patient noted to have received Xanax and Remeron at the same time the evening of 08/22/2021, as well as oxycodone 1 to 2 hours prior to that. -Head CT done unremarkable.   -EEG done was negative for seizures.  -Ammonia level within normal limits. -Discontinued Remeron and decreased oxycodone to 1 tablet every 6 hours as needed. -Vitamin B1 level pending. -Cefepime discontinued. -Patient started on high-dose IV thiamine per neurology recommendations. -Patient with clinical improvement, alert, following commands appropriately. -Patient weak with poor oral intake -Neurology consulted, was following but have signed off. -Supportive care.  AKI (acute kidney injury) ([place]) -IV fluids held due to mild swelling noted.   -Swelling likely secondary to severe hypoalbuminemia.   Right hand with ring stuck on ring finger, Dr. Florene Glen discussed with RN to help with removal. -Ring has been removed. Renal US done early on during the hospitalization-> without hydro -renal function was initially improving however trending back up likely secondary to poor oral intake.  -Urinalysis ordered however patient refused and not done. -Renal function trending up and trending back down. -Urine sodium noted at 14. -Bladder scan done with about 1200 cc of urine noted, I and O cath none on 08/26/2021 with 900 cc of urine noted.    -Renal ultrasound done negative for hydronephrosis.   -Patient starting to become volume overloaded and as such IV fluids have been saline  locked. -Patient placed on IV albumin twice daily.   -Renal function seems to be trending back down.  -Foley Foley catheter placed secondary to urinary retention. -Avoid nephrotoxins.   -Follow.  Weakness Secondary to poor oral intake as well as multiple comorbid conditions in the setting of metastatic cancer. Possibly related to capping trial? Follow symptoms after this. PT/OT assessed patient recommending SNF however it is noted that family and patient want patient to go home with home health. -Patient continues to be weak, oncology following and recommending home with hospice versus outpatient palliative care. -Patient will likely be discharged home with hospice as patient has decided this later on this afternoon.  Diet-controlled diabetes mellitus ([place]) 6.3 a1c 06/2021 -Blood glucose level of 119 this morning on lab work.   -Bicarb drip has been discontinued -Saline lock IV fluids. -Supportive care.    Essential hypertension -Controlled on current regimen of amlodipine.   -Patient with altered mental status drowsy with poor oral intake and pocketing medications early on in the hospitalization which has since improved.. -Mental status improved.  -Amlodipine resumed.   -Follow.  Acquired hypothyroidism Stable. -Synthroid. Outpatient follow-up.  Decubitus skin ulcer     Anasarca - Secondary to malnutrition/hypoalbuminemia. -IV fluids-saline lock. -DC IV albumin -Anasarca improved.  Acute urinary retention - Bladder scan done 08/26/2021 with 1200 cc of urine noted per RN. -I and O cath done on 08/26/2021 with 900 cc of urine output. -Serial bladder scans, I and O cath every 6 hours as needed was ordered.Marland Kitchen -  Foley catheter placed due to urinary retention.  Oral thrush - Patient with some concerns for oral thrush. -Continue nystatin swish and swallow.  Protein-calorie malnutrition, severe ([place]) - Patient with severe protein calorie malnutrition with albumin level <  1.5. -Likely secondary to metastatic pancreatic cancer in the setting of poor oral intake secondary to nausea. -Placed on scheduled Zofran 3 times daily x 24 hours. -Status post IV albumin every 6 hours x24 hours. -Encourage oral intake. -Due to acute metabolic encephalopathy with drowsiness and lethargy with some myoclonic jerks Remeron discontinued.  -Patient seen by speech therapist and patient placed on a regular diet with thin liquids.  -Marinol ordered for appetite stimulant per palliative care however noted to be refusing early on in the hospitalization, however seems to be taking it. -Discontinue twice daily IV albumin. -Encourage oral intake.  -Supportive care.  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 Benay Spice during this hospitalization. -Hospice recommended per oncology. -Oncology spoke to patient this morning and also spoke with patient's daughter and decision made to discharge home with hospice following.  -TOC has consulted hospice and hopefully patient may discharge home with hospice tomorrow.  -Oncology following.           DVT prophylaxis: Eliquis Code Status: DNR Family Communication: Updated husband at bedside.  Disposition: Home with palliative care versus hospice.   Status is: Inpatient Remains inpatient appropriate because: Severity of illness   Consultants:  Interventional radiology Infectious disease: Dr. Candiss Norse 08/21/2021 Palliative care: Dr. Rowe Pavy 08/21/2021 Oncology informed via epic of admission. Neurology: Dr. Leonel Ramsay 08/23/2021  Procedures:  CT abdomen and pelvis 08/21/2021 Chest x-ray 08/20/2021 Renal ultrasound 08/21/2021, 08/26/2021 CT head 08/23/2021 EEG  08/23/2021 CT head 08/23/2021    Antimicrobials:  Oral Flagyl 08/20/2021>>>> IV Flagyl 08/23/2021>>>> 08/27/2021 IV cefepime 08/21/2021>>>> 08/23/2021 IV ciprofloxacin 08/23/2021>>> 08/27/2021 Oral ciprofloxacin 08/28/2021>>>> 08/30/2021 Oral Flagyl 08/27/2021>>>> 08/30/2021    Subjective: Laying in bed sleeping but easily arousable.  No chest pain.  No shortness of breath.  No abdominal pain.  Still with poor oral intake.   .  Objective: Vitals:   08/30/21 0808 08/30/21 1625 08/30/21 1703 08/30/21 2025  BP: (!) 109/55 137/84  131/86  Pulse: 100 (!) 117 (!) 102 (!) 110  Resp: '16 16  17  '$ Temp: (!) 97.5 F (36.4 C) 98.1 F (36.7 C)  98.7 F (37.1 C)  TempSrc: Oral Oral  Oral  SpO2: 96% 94%  96%  Weight:      Height:        Intake/Output Summary (Last 24 hours) at 08/30/2021 2028 Last data filed at 08/30/2021 1500 Gross per 24 hour  Intake 957.87 ml  Output 805 ml  Net 152.87 ml   Filed Weights   08/20/21 2200  Weight: 72.8 kg    Examination:  General exam: Alert.  Dry mucous membranes.  Oral thrush improving.  Respiratory system: CTA B.  No wheezes, no crackles, no rhonchi.  Fair air movement.  Cardiovascular system: Regular rate rhythm no murmurs rubs or gallops.  No JVD.  1+ bilateral lower extremity edema.  Swelling in left upper extremity. Gastrointestinal system: Abdomen is soft, nontender, nondistended, positive bowel sounds.  No rebound.  No guarding.  Cholecystostomy tube noted right upper quadrant with drain in place.  Central  nervous system: Patient and oriented.  Moving extremities spontaneously.   Extremities: 1-2+ bilateral lower extremity edema.  Left upper extremity swelling. Skin: No rashes, lesions or ulcers Psychiatry: Judgement and insight appear fair.  Mood and affect flat    Data Reviewed:   CBC: Recent Labs  Lab 08/24/21 0342 08/25/21 0159 08/26/21 0049 08/27/21 0450 08/28/21 0053 08/29/21 1252 08/30/21 0100  WBC 8.0   < > 7.9 7.0 8.1 8.5 7.2   NEUTROABS 6.4  --   --  5.2 6.5 6.3  --   HGB 9.8*   < > 10.5* 9.6* 8.8* 9.1* 7.6*  HCT 29.7*   < > 32.7* 29.3* 27.1* 27.8* 23.2*  MCV 96.4   < > 97.6 96.7 97.1 98.2 96.7  PLT 105*   < > 109* 98* 98* 117* 110*   < > = values in this interval not displayed.    Basic Metabolic Panel: Recent Labs  Lab 08/24/21 0342 08/25/21 0159 08/26/21 0049 08/27/21 0450 08/28/21 0053 08/29/21 1252 08/30/21 0100  NA 134* 132* 135 135 134* 135 137  K 3.4* 3.9 3.9 4.3 4.4 4.2 4.4  CL 107 105 105 107 106 107 109  CO2 '22 23 22 '$ 21* 20* 21* 21*  GLUCOSE 129* 154* 170* 151* 121* 114* 119*  BUN 40* 40* 42* 40* 41* 42* 44*  CREATININE 1.55* 1.65* 1.72* 1.71* 1.66* 1.54* 1.54*  CALCIUM 8.1* 7.9* 7.8* 7.7* 8.1* 8.1* 8.1*  MG  --  2.0  --  2.0  --   --   --   PHOS 3.1 3.1  --  3.6 4.0  --  3.9    GFR: Estimated Creatinine Clearance: 28.8 mL/min (A) (by C-G formula based on SCr of 1.54 mg/dL (H)).  Liver Function Tests: Recent Labs  Lab 08/24/21 0342 08/25/21 0159 08/27/21 0450 08/28/21 0053 08/30/21 0100  AST  --  46*  --   --   --   ALT  --  23  --   --   --   ALKPHOS  --  593*  --   --   --   BILITOT  --  0.7  --   --   --   PROT  --  5.2*  --   --   --   ALBUMIN 2.1* 1.9* 2.0* 2.5* 2.8*    CBG: No results for input(s): "GLUCAP" in the last 168 hours.   Recent Results (from the past 240 hour(s))  Urine Culture     Status: Abnormal   Collection Time: 08/23/21  8:22 AM   Specimen: Urine, Catheterized  Result Value Ref Range Status   Specimen Description URINE, CATHETERIZED  Final   Special Requests NONE  Final   Culture (A)  Final    >=100,000 COLONIES/mL ENTEROCOCCUS FAECIUM 20,000 COLONIES/mL ACHROMOBACTER XYLOSOXIDANS MULTI-DRUG RESISTANT ORGANISM NOTIFIED DR. [facility] VIA EPIC CHAT REGARDING RESITANT SUSCEPTIBILITY PATTERN Performed at [facility], [place] [address] [place], [place] [address]    Report Status 08/29/2021 FINAL  Final   Organism ID, Bacteria ENTEROCOCCUS  FAECIUM (A)  Final   Organism ID, Bacteria ACHROMOBACTER XYLOSOXIDANS (A)  Final      Susceptibility   Enterococcus faecium - MIC*    AMPICILLIN >=32 RESISTANT Resistant     LEVOFLOXACIN 2 SENSITIVE Sensitive     NITROFURANTOIN 128 RESISTANT Resistant     VANCOMYCIN <=0.5 SENSITIVE Sensitive     GENTAMICIN SYNERGY SENSITIVE Sensitive     * >=100,000 COLONIES/mL ENTEROCOCCUS FAECIUM  Achromobacter xylosoxidans - MIC*    CEFAZOLIN >=64 RESISTANT Resistant     GENTAMICIN >=16 RESISTANT Resistant     CIPROFLOXACIN >=4 RESISTANT Resistant     IMIPENEM 8 INTERMEDIATE Intermediate     TRIMETH/SULFA <=20 SENSITIVE Sensitive     * 20,000 COLONIES/mL ACHROMOBACTER XYLOSOXIDANS  Urine Culture     Status: Abnormal   Collection Time: 08/26/21  8:07 AM   Specimen: Urine, Clean Catch  Result Value Ref Range Status   Specimen Description URINE, CLEAN CATCH  Final   Special Requests   Final    NONE Performed at [facility], [place] [address], [place], [place] [address]    Culture MULTIPLE SPECIES PRESENT, SUGGEST RECOLLECTION (A)  Final   Report Status 08/28/2021 FINAL  Final         Radiology Studies: No results found.      Scheduled Meds:  amLODipine  5 mg Oral Daily   apixaban  5 mg Oral BID   bisacodyl  10 mg Rectal Once   dronabinol  5 mg Oral BID AC   levothyroxine  125 mcg Oral QAC breakfast   metroNIDAZOLE  500 mg Oral Q12H   multivitamin with minerals  1 tablet Oral Daily   nystatin  5 mL Oral QID   pantoprazole  40 mg Oral Daily   polyethylene glycol  17 g Oral Daily   sodium chloride flush  5 mL Intracatheter Daily   sodium chloride flush  5 mL Intracatheter Daily   [START ON 09/01/2021] thiamine injection  100 mg Intravenous Daily   Continuous Infusions:  albumin human 25 g (08/30/21 1042)   thiamine injection 250 mg (08/30/21 1222)     LOS: 10 days    Time spent: 35 minutes    Irine Seal, MD [facility]   To contact the attending  provider between 7A-7P or the covering provider during after hours 7P-7A, please log into the web site [email or web address] and access using universal [place] password for that web site. If you do not have the password, please call the hospital operator.  08/30/2021, 8:28 PM

## 2021-08-31 ENCOUNTER — Encounter: Payer: Self-pay | Admitting: *Deleted

## 2021-08-31 DIAGNOSIS — E860 Dehydration: Secondary | ICD-10-CM | POA: Diagnosis not present

## 2021-08-31 DIAGNOSIS — E871 Hypo-osmolality and hyponatremia: Secondary | ICD-10-CM | POA: Diagnosis not present

## 2021-08-31 LAB — BASIC METABOLIC PANEL
Anion gap: 7 (ref 5–15)
BUN: 43 mg/dL — ABNORMAL HIGH (ref 8–23)
CO2: 20 mmol/L — ABNORMAL LOW (ref 22–32)
Calcium: 8.1 mg/dL — ABNORMAL LOW (ref 8.9–10.3)
Chloride: 110 mmol/L (ref 98–111)
Creatinine, Ser: 1.5 mg/dL — ABNORMAL HIGH (ref 0.44–1.00)
GFR, Estimated: 35 mL/min — ABNORMAL LOW (ref >60)
Glucose, Bld: 82 mg/dL (ref 70–99)
Potassium: 4.3 mmol/L (ref 3.5–5.1)
Sodium: 137 mmol/L (ref 135–145)

## 2021-08-31 LAB — CBC
HCT: 25.6 % — ABNORMAL LOW (ref 36.0–46.0)
Hemoglobin: 8.3 g/dL — ABNORMAL LOW (ref 12.0–15.0)
MCH: 31.4 pg (ref 26.0–34.0)
MCHC: 32.4 g/dL (ref 30.0–36.0)
MCV: 97 fL (ref 80.0–100.0)
Platelets: 137 10*3/uL — ABNORMAL LOW (ref 150–400)
RBC: 2.64 MIL/uL — ABNORMAL LOW (ref 3.87–5.11)
RDW: 19.7 % — ABNORMAL HIGH (ref 11.5–15.5)
WBC: 8.5 10*3/uL (ref 4.0–10.5)
nRBC: 0.2 % (ref 0.0–0.2)

## 2021-08-31 LAB — VITAMIN B1: Vitamin B1 (Thiamine): 65.4 nmol/L — ABNORMAL LOW (ref 66.5–200.0)

## 2021-08-31 MED ORDER — THIAMINE HCL 100 MG PO TABS: 100.0000 mg | ORAL_TABLET | Freq: Every day | ORAL | Status: DC

## 2021-08-31 NOTE — Plan of Care (Signed)
  Problem: Education: Goal: Knowledge of General Education information will improve Description: Including pain rating scale, medication(s)/side effects and non-pharmacologic comfort measures 08/31/2021 1301 by Santa Lighter, RN Outcome: Progressing 08/31/2021 1301 by Santa Lighter, RN Outcome: Progressing   Problem: Health Behavior/Discharge Planning: Goal: Ability to manage health-related needs will improve 08/31/2021 1301 by Santa Lighter, RN Outcome: Not Progressing 08/31/2021 1301 by Santa Lighter, RN Outcome: Progressing

## 2021-08-31 NOTE — Progress Notes (Signed)
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 ([name])   Weakness   Decubitus skin ulcer   Acute metabolic encephalopathy   Cholecystitis   VTE (venous thromboembolism)   AKI (acute kidney injury) ([name])   Protein-calorie malnutrition, severe ([name])   Palliative care by specialist   Oral thrush   Acute urinary retention   Anasarca          Assessment: [name] direct admitted due to N/V and decreased PO intake   # Dehydration # Weakness 2/2 decrease PO intake  # Hx of Subcapsular fluid collection adjacent to the liver(17.6 cm) with Cx NG #Small perihepatic fluid collection(2.2cm)-separate from drain # Hx of Acute cholecystitis SP cholecystostomy  with IR on 5/5 with Cx with Morganella morganii/B ovatus and E coli  # Hx of  Morganella morganii and E coli  bacteremia on 5/3  #Adenocarcinoma of pancrease, 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 SP CT guided drainage of perihepatic fluid 5/10. Cx no growth. She was discharged on vanc and cefepime with EOT 08/20/21 -CT on 6/2 showed near complete resolution of previous large perihepatic free fluid collection. Small volume residual inferomedial hepatic fluid collection, measuring 2.3 cm,stable. . Plan was for capping trial and 2 week follow-up with IR.  -She presented ot ED on 6/1 with abdominal pain. CT(6/6) showed rt perihepatic drainage catheter in place with near complete resolution of perihepatic fluid collection. Slightly increased distension of gallbladder. Numerous liver mets.  Unchanged 2.2 cm posteroinferior perihepatic fluid collection separate from the drain.  -Seen by ID on 6/5, and found to have PO intolerance. She was direct admitted for IVF(did not want to fo to ED). She noted abdominal pain is getting better. -Pt had worsening mental status on 6/8, noted to received remaron/xanax. Neurology consulted and concerned with cefepime toxicity. Cefepime switched to ciprofloxacin. Pallative care on board, pending weekend progressio, may place hospice referral -IR consulted and perihepatic drain removed on 6/9, perc choli drain remains. -Oncology following and plan on discussing home hospice with family -Seen by general surgery(6/13) per distended gallbladder, no plans for OR. Perc choli drain needs to stay in place indefinitely unless cystic duct becomes patent.  -IR does not plan on aspirating 2.2 cm collection as it is unchanged, if further concerns recommend re-imaging.  -D/C antibiotics on 6/15. She has recieved about 5 weeks of antibiotics from hepatic drain placement on 5/10 (removed) and 2.2 cm fluid collection is unchanged. Recommendations:  -Pt changed to comfort care and home with hospice. ID follow-up appointment cancelled.   #Urine Cx+ E faecium -Represents asymptomatic bacteruria- pt has no urinary complaints. Repeat urine Cx on 6/11 show multiple species.    Microbiology:   Antibiotics: Vancomycin 5/3 Cefepime 5/3, 5/6-6/8 Ciprofloxacin 6/09-6/14 Aztreonam 5/3, Ceftriaxone 5/4-5/6 Metronidazole 5/3-6/15     Cultures:   Prior hospitalization: 5/3 blood Cx+ 2/2 Morgenella Morgani and 1/2 Ecoli 5/5 GB abscess Cx+ Ecoli, morganella morganii and bacteroides ovatus(beta lactamase positive) 5/10liver abscess perihepatic collection with NG  SUBJECTIVE: Resting in bed. No new complaints.  Interval: Afebrile overnight.  Wbc 8.5K Review of Systems: Review of Systems  All other systems reviewed and are negative.    Scheduled Meds:  amLODipine  5 mg  Oral Daily   apixaban  5 mg Oral BID   bisacodyl  10 mg Rectal Once   dronabinol  5 mg Oral BID AC   levothyroxine  125 mcg Oral QAC breakfast   multivitamin with minerals  1 tablet Oral Daily   nystatin  5 mL Oral QID   pantoprazole  40 mg Oral Daily   polyethylene glycol  17 g Oral Daily   sodium chloride flush  5 mL Intracatheter Daily   sodium chloride flush  5 mL Intracatheter Daily   [START ON 09/01/2021] thiamine  100 mg Oral Daily   Continuous Infusions:   PRN Meds:.ALPRAZolam, docusate sodium, ibuprofen, iohexol, oxyCODONE, prochlorperazine, sodium chloride flush Allergies  Allergen Reactions   Penicillins Hives and Other (See Comments)    Tolerated Ceftriaxone 2023  Has patient had a PCN reaction causing immediate rash, facial/tongue/throat swelling, SOB or lightheadedness with hypotension: No Has patient had a PCN reaction causing severe rash involving mucus membranes or skin necrosis: No Has patient had a PCN reaction that required hospitalization No Has patient had a PCN reaction occurring within the last 10 years: No If all of the above answers are "NO", then may proceed with Cephalosporin use.    OBJECTIVE: Vitals:   08/30/21 1703 08/30/21 2025 08/31/21 0406 08/31/21 1031  BP:  131/86 133/87 (!) 130/109  Pulse: (!) 102 (!) 110 (!) 110 (!) 109  Resp:  17  20  Temp:  98.7 F (37.1 C) 98.5 F (36.9 C) 97.7 F (36.5 C)  TempSrc:  Oral Oral   SpO2:  96% 96% 95%  Weight:      Height:       Body mass index is 28.43 kg/m.  Physical Exam Constitutional:      Appearance: Normal appearance.  HENT:     Head: Normocephalic and atraumatic.     Right Ear: Tympanic membrane normal.     Left Ear: Tympanic membrane normal.     Nose: Nose normal.     Mouth/Throat:     Mouth: Mucous membranes are moist.  Eyes:     Extraocular Movements: Extraocular movements intact.     Conjunctiva/sclera: Conjunctivae normal.     Pupils: Pupils are equal, round, and reactive to  light.  Cardiovascular:     Rate and Rhythm: Normal rate and regular rhythm.     Heart sounds: No murmur heard.    No friction rub. No gallop.  Pulmonary:     Effort: Pulmonary effort is normal.     Breath sounds: Normal breath sounds.  Abdominal:     General: Abdomen is flat.     Palpations: Abdomen is soft.     Comments: ABD drains in place  Musculoskeletal:        General: Normal range of motion.  Skin:    General: Skin is warm and dry.  Neurological:     General: No focal deficit present.     Mental Status: She is oriented to person, place, and time.  Psychiatric:        Mood and Affect: Mood normal.       Lab Results Lab Results  Component Value Date   WBC 8.5 08/31/2021   HGB 8.3 (L) 08/31/2021   HCT 25.6 (L) 08/31/2021   MCV 97.0 08/31/2021   PLT 137 (L) 08/31/2021  Lab Results  Component Value Date   CREATININE 1.50 (H) 08/31/2021   BUN 43 (H) 08/31/2021   NA 137 08/31/2021   K 4.3 08/31/2021   CL 110 08/31/2021   CO2 20 (L) 08/31/2021    Lab Results  Component Value Date   ALT 23 08/25/2021   AST 46 (H) 08/25/2021   ALKPHOS 593 (H) 08/25/2021   BILITOT 0.7 08/25/2021        Laurice Record, [facility] 08/31/2021, 2:14 PM

## 2021-08-31 NOTE — Progress Notes (Signed)
HEMATOLOGY-ONCOLOGY PROGRESS NOTE  ASSESSMENT AND PLAN: Adenocarcinoma the pancreas body, stage IV (cT4,cN0,pM1) Ultrasound abdomen 08/18/2020-possible hypoechoic pancreas body mass, small hypoechoic liver areas MRI abdomen 08/27/2020-pancreas body mass, multiple hepatic lesions consistent with metastases, right abdominal omental nodularity, pancreas mass extends to the celiac bifurcation and abuts the splenic vein and splenoportal confluence CT chest 09/07/2020-scattered pulmonary nodules concerning for metastases, pancreas body mass, hepatic metastases Ultrasound-guided biopsy of a right liver lesion 09/08/2020-adenocarcinoma consistent with a pancreas primary CT abdomen/pelvis 09/24/2020-pancreas neck mass, omental and peritoneal nodularity-unchanged, multiple hypoenhancing liver masses-unchanged, numerous small pulmonary nodules Cycle 1 gemcitabine/Abraxane 10/04/2020 Cycle 2 gemcitabine/Abraxane 10/27/2020 Cycle 3 gemcitabine/Abraxane 11/11/2018 Cycle 4 gemcitabine/Abraxane 11/23/2020 Cycle 5 gemcitabine/Abraxane 12/08/2020 CT abdomen/pelvis 12/18/2020-stable pancreas mass, stable and improved liver lesions, resolution of omental soft tissue density, stable indeterminate lung nodules, no disease progression Cycle 6 gemcitabine/Abraxane 12/22/2020 Cycle 7 gemcitabine 01/05/2021, 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  Cycle 8 gemcitabine/Abraxane 01/19/2021 Cycle 9 gemcitabine/Abraxane 02/01/2021 Cycle 10 Gemcitabine 02/16/2021, Abraxane held due to increased neuropathy Cycle 11 Gemcitabine 03/05/2021, 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 Cycle 12 gemcitabine 03/14/2021, 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 Cycle 13 gemcitabine 04/02/2021, 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 Cycle 14 gemcitabine/Abraxane 04/16/2021, Abraxane resumed at a reduced dose Cycle 15 gemcitabine/Abraxane 04/30/2021 CT abdomen/pelvis 05/08/2021-stable pancreas mass, liver metastases are slightly smaller, stable tiny bilateral lower lung nodules Cycle  16 gemcitabine/Abraxane 05/14/2021, Abraxane escalated back to 100 mg per metered squared Cycle 17 gemcitabine/Abraxane 05/28/2021 Cycle 18 gemcitabine/Abraxane 06/11/2021 Cycle 19 gemcitabine/Abraxane 07/09/2021 CT Abdo/pelvis 07/18/2021-diffuse gallbladder wall thickening and edema, small volume ascites, stable pulmonary nodules, indeterminate 15 x 18 mm right liver lesion, ill-defined hypodensity in the body of the pancreas 07/24/2021-right pleural fluid cytology-negative   2.  Pain secondary #1 3.  Diabetes 4.  Hypertension 5.  Osteoarthritis 6.  Port-A-Cath placement 09/22/2020 7.  Admission with jaundice 09/24/2020.  MRI-new abrupt constriction of the common hepatic duct.  The infiltrative pancreatic mass extends medially in the porta hepatis to obstruct the common hepatic duct.  Multiple right hepatic lobe metastases mildly increased in size.  Stent placed into the common bile duct 09/28/2020. 8.  COVID-19 06/20/2021, admission with COVID and bacterial pneumonia?  06/30/2021-completed course of antibiotics 9.  Hospital admission 07/18/2021-bacteremia, UTI, acute cholecystitis Cholecystostomy tube 07/20/2021 CT abdomen/pelvis 07/24/2021-moderate right pleural effusion, new subcapsular fluid collections adjacent to the right liver, interval cholecystostomy tube, stable pancreas body mass, incidental pulmonary embolism and a left lower lobe pulmonary artery Drain in perihepatic fluid collection 07/27/2021 10.  Pulmonary embolism/bilateral DVTs CT abdomen/pelvis 07/24/2021-incidental left lower lobe pulmonary embolism CT chest 07/24/2021-segmental and subsegmental bilateral pulmonary emboli Dopplers 07/24/2021-DVTs in the right common femoral, left common femoral, left profunda veins Heparin 07/24/2021, converted to apixaban on discharge 08/01/2021 11.  Bilateral foot numbness developing during hospital admission May 2023-Abraxane neuropathy? [facility] admission 08/20/2021-dehydration, weakness  [name] appears  unchanged.  She continues to have a poor performance status.  She is not a candidate for further chemotherapy.  I recommend hospice care.  I discussed this recommendation with her daughter by telephone yesterday afternoon.  [name] agrees to home hospice care.  Recommendations: 1.  Discharge to home with hospice care 2.  I serve as the primary provider with hospice and outpatient follow-up will be scheduled at the Cancer center  [place]: [name] has intermittent abdominal pain.  No new complaint.  She would like to go home. Oncology History Overview Note  Cancer Staging Primary pancreatic cancer with metastasis  to other site [facility]) Staging form: Exocrine Pancreas, AJCC 8th Edition - Clinical stage from 09/08/2020: Stage IV (cT4, cN0, pM1) - Signed by Truitt Merle, MD on 09/12/2020 Stage prefix: Initial diagnosis Total positive nodes: 0    Primary pancreatic cancer with metastasis to other site ([place])  08/27/2020 Imaging   Abdominal MRI w wo contrast: IMPRESSION: 1. Pancreatic body infiltrative mass, favoring adenocarcinoma. 2. Multiple hepatic lesions, most consistent with metastasis. 3. Right abdominal omental nodularity, highly suspicious for peritoneal metastasis. 4. Recommend multidisciplinary oncology consultation for eventual sampling of either the liver or pancreatic lesions. 5. Vascular involvement with tumor, as detailed above.   09/07/2020 Imaging   CT chest  IMPRESSION: 1. Scattered small pulmonary nodules, worrisome for metastatic disease. 2. Pancreatic body mass and hepatic metastases, better seen and described on MR abdomen 08/27/2020. 3. Aortic atherosclerosis (ICD10-I70.0). Coronary artery calcification.   09/08/2020 Cancer Staging   Staging form: Exocrine Pancreas, AJCC 8th Edition - Clinical stage from 09/08/2020: Stage IV (cT4, cN0, pM1) - Signed by Truitt Merle, MD on 09/12/2020 Stage prefix: Initial diagnosis Total positive nodes: 0    09/08/2020 Pathology Results   A. LIVER, RIGHT, BIOPSY:  - Adenocarcinoma.  See comment.   COMMENT:   The morphology is consistent with a pancreatic primary.    09/12/2020 Initial Diagnosis   Primary pancreatic cancer with metastasis to other site [facility])   10/12/2020 -  Chemotherapy   Patient is on Treatment Plan : PANCREATIC Abraxane / Gemcitabine D1,8,15 q28d       PHYSICAL EXAMINATION:  Vitals:   08/30/21 2025 08/31/21 0406  BP: 131/86 133/87  Pulse: (!) 110 (!) 110  Resp: 17   Temp: 98.7 F (37.1 C) 98.5 F (36.9 C)  SpO2: 96% 96%   Filed Weights   08/20/21 2200  Weight: 160 lb 7.9 oz (72.8 kg)    Intake/Output from previous day: 06/15 0701 - 06/16 0700 In: 285.4 [IV Piggyback:285.4] Out: 655 [Urine:650; Drains:5]  Exam: Awake, conversant HEENT-no thrush Abdomen-two right upper abdomen drains, no mass, mildly distended Vascular-pitting edema of the left greater than right arm, and legs-improved  LABORATORY DATA:  I have reviewed the data as listed    Latest Ref Rng & Units 08/31/2021    4:16 AM 08/30/2021    1:00 AM 08/29/2021   12:52 PM  CMP  Glucose 70 - 99 mg/dL 82  119  114   BUN 8 - 23 mg/dL 43  44  42   Creatinine 0.44 - 1.00 mg/dL 1.50  1.54  1.54   Sodium 135 - 145 mmol/L 137  137  135   Potassium 3.5 - 5.1 mmol/L 4.3  4.4  4.2   Chloride 98 - 111 mmol/L 110  109  107   CO2 22 - 32 mmol/L '20  21  21   '$ Calcium 8.9 - 10.3 mg/dL 8.1  8.1  8.1     Lab Results  Component Value Date   WBC 8.5 08/31/2021   HGB 8.3 (L) 08/31/2021   HCT 25.6 (L) 08/31/2021   MCV 97.0 08/31/2021   PLT 137 (L) 08/31/2021   NEUTROABS 6.3 08/29/2021    Lab Results  Component Value Date   CAN199 16,591 (H) 08/09/2021    US RENAL  Result Date: 08/26/2021 CLINICAL DATA:  Renal dysfunction EXAM: RENAL / URINARY TRACT ULTRASOUND COMPLETE COMPARISON:  08/21/2021 FINDINGS: Right Kidney: Renal measurements: 10.6 x 5.1 x 5.9 cm = volume: 168 mL. There is no  hydronephrosis. There is  increased cortical echogenicity. There is 3.5 cm cyst in the upper pole. There is 1.6 cm cyst in the midportion. Left Kidney: Renal measurements: 9.4 x 4.7 x 5.4 cm = volume: 124 mL. There is no hydronephrosis. There is increased cortical echogenicity. Bladder: Appears normal for degree of bladder distention. Other: Small ascites is seen. Minimal amount of perinephric fluid collection may be part of ascites. Spleen appears to be enlarged in size. There is possible right pleural effusion. IMPRESSION: There is no hydronephrosis. There is increased cortical echogenicity in both kidneys suggesting medical renal disease. Right renal cysts. Ascites.  Right pleural effusion. Electronically Signed   By: Elmer Picker M.D.   On: 08/26/2021 12:01   EEG adult  Result Date: 08/23/2021 Lora Havens, MD     08/23/2021  8:07 PM Patient Name: [name] MRN: [id number] Epilepsy Attending: Lora Havens Referring Physician/Provider: Eugenie Filler, MD Date: 08/23/2021 Duration: 28.24 mins Patient history: 76yo F with ams and continuous jerking movements of both arms. EEG to evaluate for seizure. Level of alertness: lethargic AEDs during EEG study: None Technical aspects: This EEG study was done with scalp electrodes positioned according to the 10-20 International system of electrode placement. Electrical activity was acquired at a sampling rate of '500Hz'$  and reviewed with a high frequency filter of '70Hz'$  and a low frequency filter of '1Hz'$ . EEG data were recorded continuously and digitally stored. Description: No clear posterior dominant rhythm was seen. EEG showed continuous generalized polymorphic 3-'5Hz'$  theta-delta slowing. Generalized periodic discharges with triphasic morphology at 1-1.'5Hz'$  were also noted, more prominent when awake/stimulated. Hyperventilation and photic stimulation were not performed.    ABNORMALITY - Periodic discharges with triphasic morphology, generalized ( GPDs)  - Continuous slow, generalized  IMPRESSION: This study showed generalized periodic discharges with triphasic morphology. This EEG pattern is on the ictal-interictal continuum. However the morphology, frequency and reactivity to stimulation is more commonly indicative of toxic-metabolic causes like hyperammonemia, cefepime toxicity. Additionally there is moderate to severe diffuse encephalopathy. No seizures were seen throughout the recording.  If patient remains altered, can consider prolonged EEG monitoring.  Priyanka Barbra Sarks    CT HEAD WO CONTRAST (5MM)  Result Date: 08/23/2021 CLINICAL DATA:  Altered mental status EXAM: CT HEAD WITHOUT CONTRAST TECHNIQUE: Contiguous axial images were obtained from the base of the skull through the vertex without intravenous contrast. RADIATION DOSE REDUCTION: This exam was performed according to the departmental dose-optimization program which includes automated exposure control, adjustment of the mA and/or kV according to patient size and/or use of iterative reconstruction technique. COMPARISON:  06/01/2016 FINDINGS: Brain: No acute intracranial findings are seen in noncontrast CT brain. There are no signs of bleeding. Cortical sulci are prominent. There is decreased density in the periventricular white matter. Vascular: Unremarkable. Skull: Unremarkable. Sinuses/Orbits: Unremarkable. Other: No significant interval changes are noted. IMPRESSION: No acute intracranial findings are seen in noncontrast CT brain. Atrophy. Small-vessel disease. Electronically Signed   By: Elmer Picker M.D.   On: 08/23/2021 13:11   CT ABDOMEN PELVIS W CONTRAST  Result Date: 08/21/2021 CLINICAL DATA:  Cholecystitis status post cholecystostomy tube placement. History of pancreatic cancer. EXAM: CT ABDOMEN AND PELVIS WITH CONTRAST TECHNIQUE: Multidetector CT imaging of the abdomen and pelvis was performed using the standard protocol following bolus administration of intravenous contrast.  RADIATION DOSE REDUCTION: This exam was performed according to the departmental dose-optimization program which includes automated exposure control, adjustment of the mA and/or kV according to patient size and/or use of  iterative reconstruction technique. CONTRAST:  152m OMNIPAQUE IOHEXOL 300 MG/ML  SOLN COMPARISON:  CT abdomen pelvis dated August 16, 2021. FINDINGS: Lower chest: Unchanged moderate right and small left pleural effusions with adjacent atelectasis in both posterior lower lobes. Hepatobiliary: Multiple hypodense liver lesions are unchanged. Right perihepatic drainage catheter remains in place without significant residual perihepatic fluid collection. 2.2 cm posteroinferior perihepatic fluid collection is unchanged (series 3, image 20). New small amount of pneumobilia in the left liver. Multiple gallstones again noted with slightly increased distention of the gallbladder status post cholecystostomy tube placement. Unchanged common bile duct stent. Pancreas: Unchanged hypodense mass in the pancreatic neck with atrophy of the body and tail. Spleen: Unchanged wedge-shaped hypodensity in the inferior spleen, likely a small infarct. Normal in size. Adrenals/Urinary Tract: Adrenal glands are unremarkable. Unchanged small right renal simple cysts. No follow-up imaging is recommended. No renal calculi or hydronephrosis. The bladder is distended with excreted contrast. Stomach/Bowel: Unchanged small hiatal hernia. The stomach is otherwise within normal limits. No bowel wall thickening, distention, or surrounding inflammatory changes. Scattered mild colonic diverticulosis. Similar moderate stool burden throughout the colon. Normal appendix. 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 Aortoiliac atherosclerotic vascular disease. No enlarged abdominal or pelvic lymph nodes. Reproductive:  Status post hysterectomy. No adnexal masses. Other: Decreased small volume ascites in the pelvis. No pneumoperitoneum. Musculoskeletal: No acute or significant osseous findings. Unchanged anasarca. IMPRESSION: 1. Status post cholecystostomy tube placement with slightly increased distention of the gallbladder compared to prior study. Correlate with tube function. 2. Right perihepatic drainage catheter remains in place without significant residual perihepatic fluid collection around the drain. Unchanged 2.2 cm posteroinferior perihepatic fluid collection separate from the drain. 3. 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 4. Decreased small volume ascites in the pelvis. 5. Unchanged moderate right and small left pleural effusions. 6. 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 7. Aortic Atherosclerosis (ICD10-I70.0). Electronically Signed   By: WTitus DubinM.D.   On: 08/21/2021 17:58   [place]  Result Date: 08/21/2021 CLINICAL DATA:  Acute kidney injury EXAM: RENAL / URINARY TRACT ULTRASOUND COMPLETE COMPARISON:  CT abdomen 08/16/2021 FINDINGS: Right Kidney: Renal measurements: 9.5 by 4.0 by 5.0 cm = volume: 102 mL. Mildly hyperechoic relative to the liver. No solid mass or hydronephrosis visualized. A right kidney upper pole cyst measures 2.0 by 3.0 by 2 2.6 cm and appears simple. Left Kidney: Renal measurements: 9.7 by 4.0 by 4.8 cm = volume: 99 mL. Mildly hyperechoic relative to the spleen. No mass or hydronephrosis visualized. Bladder: Appears normal for degree of bladder distention. A left ureteral jet is visualized. Ascites in the lower abdomen. IMPRESSION: 1. Bilateral renal parenchymal hyperechogenicity suggesting chronic medical renal disease. 2. Right kidney upper pole simple appearing cyst. 3. A left ureteral jet is visualized in the urinary bladder. Right ureteral jet not  seen, although this may simply be incidental. 4. Pelvic ascites. Electronically Signed   By: WVan ClinesM.D.   On: 08/21/2021 16:00   DG CHEST PORT 1 VIEW  Result Date: 08/20/2021 CLINICAL DATA:  Status post PICC line placement. EXAM: PORTABLE CHEST 1 VIEW COMPARISON:  07/27/2021. FINDINGS: The heart size and mediastinal contours are within normal limits. There is atherosclerotic calcification of the aorta. Patchy airspace disease is noted at the right lung base. There  are questionable small bilateral pleural effusions. No pneumothorax. A right chest port is stable. No PICC line is identified. A stable pigtail catheter is noted in the right upper quadrant. No acute osseous abnormality. IMPRESSION: 1. Small bilateral pleural effusions with atelectasis at the lung bases. 2. No PICC line is identified.  Clinical correlation is recommended. Electronically Signed   By: Brett Fairy M.D.   On: 08/20/2021 23:45   CT ABDOMEN PELVIS W CONTRAST  Result Date: 08/17/2021 CLINICAL DATA:  Abdominal pain, acute, nonlocalized EXAM: CT ABDOMEN AND PELVIS WITH CONTRAST TECHNIQUE: Multidetector CT imaging of the abdomen and pelvis was performed using the standard protocol following bolus administration of intravenous contrast. RADIATION DOSE REDUCTION: This exam was performed according to the departmental dose-optimization program which includes automated exposure control, adjustment of the mA and/or kV according to patient size and/or use of iterative reconstruction technique. CONTRAST:  62m OMNIPAQUE IOHEXOL 300 MG/ML  SOLN COMPARISON:  08/15/2021 FINDINGS: Lower chest: Small bilateral pleural effusions. Compressive atelectasis in the lower lobes. Findings stable since prior study. Hepatobiliary: Right perihepatic drainage catheter remains in place with near complete resolution of perihepatic fluid collection. Inferior perihepatic fluid collection again noted, measuring 2.3 cm, stable. Numerous hypodensities  throughout the liver are again seen and unchanged compatible with metastases. Cholecystostomy tube in place. Gallbladder decompressed. Gallstone noted within the gallbladder. Common bile duct stent noted, unchanged. Pancreas: Ill-defined hypodensity in the body of the pancreas compatible with known pancreatic malignancy. Pancreatic tail atrophy. Spleen: No focal abnormality.  Normal size. Adrenals/Urinary Tract: Right upper pole renal cyst. No hydronephrosis. Adrenal glands and urinary bladder unremarkable. Stomach/Bowel: Large stool burden in the colon. Sigmoid diverticulosis. Stomach and small bowel decompressed. Vascular/Lymphatic: Aortoiliac atherosclerosis. No adenopathy or aneurysm. Reproductive: Prior hysterectomy.  No adnexal masses. Other: Moderate free fluid in the pelvis, unchanged. Musculoskeletal: No acute bony abnormality. IMPRESSION: Essentially stable CT since 2 days prior. Small bilateral pleural effusions with compressive atelectasis. Numerous metastases within the liver, stable. Perihepatic fluid collections are stable. Drainage catheter within a right perihepatic fluid collection and in the gallbladder. Pancreatic body mass compatible with known pancreatic malignancy. Pancreatic tail atrophy. Large stool burden in the colon. Aortic atherosclerosis. Moderate free fluid in the pelvis. Electronically Signed   By: KRolm BaptiseM.D.   On: 08/17/2021 02:22   CT ABDOMEN PELVIS W CONTRAST  Result Date: 08/15/2021 CLINICAL DATA:  Briefly, 76year old female with a history of pancreatic tumor, perihepatic fluid collection and acute cholecystitis s/p perihepatic drain and cholecystostomy drain placement. EXAM: CT ABDOMEN AND PELVIS WITH CONTRAST TECHNIQUE: Multidetector CT imaging of the abdomen and pelvis was performed using the standard protocol following bolus administration of intravenous contrast. RADIATION DOSE REDUCTION: This exam was performed according to the departmental dose-optimization  program which includes automated exposure control, adjustment of the mA and/or kV according to patient size and/or use of iterative reconstruction technique. CONTRAST:  1085mISOVUE-300 IOPAMIDOL (ISOVUE-300) INJECTION 61% COMPARISON:  IR fluoroscopy, earlier same day.  CT AP, 07/24/2021. FINDINGS: Lower chest: Persistent small volume bilateral pleural effusions, RIGHT-greater-than LEFT, and adjacent dependent consolidations Hepatobiliary: *Well-positioned percutaneous drainage catheter with near-complete resolution of previously large RIGHT perihepatic fluid collection. *Trace residual collection along the hepatic dome measures up to 5.0 x 1.0 cm, previously 17 cm. *Additional inferomedial perihepatic fluid collection measures 3.0 x 1.7 cm, previously 6 cm *Multifocal hypodense lesions within the LEFT and RIGHT hepatic lobes, largest at hepatic segment VI measuring 3.5 x 2.5 cm, suspicious for hepatic metastases. *Stable positioning of  cholecystostomy drain with contracted gallbladder. Metallic biliary stent. No intra or extrahepatic biliary ductal dilation. Pancreas: Normal contrast enhancement of the pancreatic head. Similar ill-defined hypodensity within the body of pancreas and pancreatic tail atrophy. Findings compatible with known pancreatic malignancy Spleen: New area of hypodensity within the inferior splenic pole, in an area measuring 3.0 x 2.0 cm. Adrenals/Urinary Tract: Adrenal glands are unremarkable. RIGHT exophytic and renal cortical renal cysts, unchanged. The LEFT kidney is normal. No renal calculi or hydronephrosis. Bladder is unremarkable. Stomach/Bowel: Small hiatus hernia. Nonobstructed bowel. Severe burden of sigmoid diverticulosis. No evidence of bowel wall thickening, distention, or inflammatory changes. Vascular/Lymphatic: Aortic atherosclerosis without aneurysmal dilation. No enlarged abdominal or pelvic lymph nodes. Reproductive: Status post hysterectomy. No adnexal mass. Other: Small  volume of residual ascites within the dependent pelvis. Musculoskeletal: Mild body wall edema. Multilevel spinal degenerative change. No interval osseous abnormality. IMPRESSION: Since CT AP dated 07/24/2021; 1. Well-positioned percutaneous drainage catheter with near-complete resolution of previously large perihepatic free fluid collection. Small volume residual inferomedial hepatic fluid collection. 2. Stable positioning of cholecystostomy drain with contracted gallbladder. 3. Metallic biliary stent without intra- or extrahepatic biliary ductal dilation. 4. Pancreatic body mass consistent with known malignancy. Increased conspicuity of multifocal hepatic lesions, as above, is suspicious for metastases. 5. New hypodensity within the inferior splenic pole, suspicious for regional hypoperfusion and splenic infarct. 6. Bilateral small volume pleural effusions, greater on RIGHT. Additional incidental, chronic and senescent findings as above. These results will be called to the ordering clinician or representative by the Radiologist Assistant, and communication documented in the PACS or [employer]. Electronically Signed   By: Michaelle Birks M.D.   On: 08/15/2021 17:41   DG Sinus/Fist Tube Chk-Non GI  Result Date: 08/15/2021 CLINICAL DATA:  Briefly, 76 year old female with a history of pancreatic tumor, perihepatic fluid collection and acute cholecystitis s/p perihepatic drain and cholecystostomy drain placement. EXAM: CHOLECYSTOSTOMY DRAIN INJECTION COMPARISON:  CT AP, concurrent and 07/24/2021.  IR CT, 07/25/2021. CONTRAST:  40 mL Omnipaque 300-administered via the existing cholecystostomy drain. FLUOROSCOPY TIME:  Fluoroscopic dose; 11.3 mGy TECHNIQUE: The patient was positioned supine on the fluoroscopy table. A preprocedural spot fluoroscopic image was obtained of the RIGHT upper quadrant and the existing percutaneous cholecystostomy drainage catheter. Multiple spot fluoroscopic and radiographic images were  obtained following the injection of a small amount of contrast via the existing percutaneous cholecystostomy catheter. A contrast injection for the perihepatic drainage catheter was not performed. Procedure performed by Pasty Spillers, IR PA FINDINGS: Cholecystostomy drain injection without evidence of cystic duct patency. Metallic biliary stent. IMPRESSION: No fluoroscopic evidence of cystic duct patency. The cholecystostomy drain was therefore LEFT in place. Electronically Signed   By: Michaelle Birks M.D.   On: 08/15/2021 16:41   IR Radiologist Eval & Mgmt  Result Date: 08/15/2021 Please refer to notes tab for details about interventional procedure. (Op Note)    Future Appointments  Date Time Provider [place]  09/05/2021 10:00 AM Rosiland Oz, MD [facility]  10/17/2021  3:00 PM [name], MD [facility]  12/10/2021 11:45 AM [facility] HEALTH COACH [facility]      LOS: 11 days

## 2021-08-31 NOTE — Progress Notes (Signed)
OT Cancellation Note and Discharge  Patient Details Name: [name] MRN: [id number] DOB: 1945-04-19   Cancelled Treatment:    Reason Eval/Treat Not Completed:  (Pt going home with daughter and hospice care.)  Malka So, OTR/L 08/31/2021, 2:59 PM

## 2021-08-31 NOTE — Progress Notes (Signed)
Called husband to let him know that EMS had picked up wife to transport her home.

## 2021-08-31 NOTE — Plan of Care (Signed)
  Problem: Education: Goal: Knowledge of General Education information will improve Description: Including pain rating scale, medication(s)/side effects and non-pharmacologic comfort measures 08/31/2021 1302 by Santa Lighter, RN Outcome: Adequate for Discharge 08/31/2021 1301 by Santa Lighter, RN Outcome: Progressing 08/31/2021 1301 by Santa Lighter, RN Outcome: Progressing   Problem: Health Behavior/Discharge Planning: Goal: Ability to manage health-related needs will improve 08/31/2021 1302 by Santa Lighter, RN Outcome: Adequate for Discharge 08/31/2021 1301 by Santa Lighter, RN Outcome: Not Progressing 08/31/2021 1301 by Santa Lighter, RN Outcome: Progressing   Problem: Clinical Measurements: Goal: Ability to maintain clinical measurements within normal limits will improve Outcome: Adequate for Discharge Goal: Will remain free from infection Outcome: Adequate for Discharge Goal: Diagnostic test results will improve Outcome: Adequate for Discharge Goal: Respiratory complications will improve Outcome: Adequate for Discharge Goal: Cardiovascular complication will be avoided Outcome: Adequate for Discharge   Problem: Activity: Goal: Risk for activity intolerance will decrease Outcome: Adequate for Discharge   Problem: Nutrition: Goal: Adequate nutrition will be maintained Outcome: Adequate for Discharge   Problem: Coping: Goal: Level of anxiety will decrease Outcome: Adequate for Discharge   Problem: Elimination: Goal: Will not experience complications related to bowel motility Outcome: Adequate for Discharge Goal: Will not experience complications related to urinary retention Outcome: Adequate for Discharge   Problem: Pain Managment: Goal: General experience of comfort will improve Outcome: Adequate for Discharge   Problem: Safety: Goal: Ability to remain free from injury will improve Outcome: Adequate for Discharge   Problem: Skin Integrity: Goal: Risk for  impaired skin integrity will decrease Outcome: Adequate for Discharge   Problem: SLP Dysphagia Goals Goal: Patient will utilize recommended strategies Description: Patient will utilize recommended strategies during swallow to increase swallowing safety with Outcome: Adequate for Discharge   Problem: Acute Rehab PT Goals(only PT should resolve) Goal: Pt Will Go Supine/Side To Sit Outcome: Adequate for Discharge Goal: Patient Will Perform Sitting Balance Outcome: Adequate for Discharge Goal: Patient Will Transfer Sit To/From Stand Outcome: Adequate for Discharge Goal: Pt Will Transfer Bed To Chair/Chair To Bed Outcome: Adequate for Discharge

## 2021-08-31 NOTE — Discharge Summary (Signed)
Physical Therapy Discharge Patient Details Name: [name] MRN: [id number] DOB: September 16, 1945 Today's Date: 08/31/2021 Time:  -     Patient discharged from PT services secondary to family decision regarding hospice care.  Please see latest therapy progress note for current level of functioning and progress toward goals.    Progress and discharge plan discussed with patient and/or caregiver:  MD and staff have agreed that PT is no longer indicated for this patient.  GP     Ramond Dial 08/31/2021, 10:55 AM   Mee Hives, PT PhD Acute Rehab Dept. Number: [place] [facility] [place]

## 2021-08-31 NOTE — Progress Notes (Signed)
Patient refused to allow me to complete a full assessment. She kept saying leave me alone. Husband, in room, was in agreement to leave her alone.

## 2021-08-31 NOTE — Discharge Summary (Signed)
Physician Discharge Summary  [name] DGL:[id number] DOB: [birth year] DOA: 08/20/2021  PCP: [name], MD  Admit date: 08/20/2021 Discharge date: 08/31/2021  Admitted From: Home Discharge disposition: Home with hospice services  Recommendations at discharge:  Per hospice policy  History of Present Illness / Brief narrative:  [name] is a 76 y.o. female with medical history significant for acute cholecystitis status post cholecystostomy, history of pancreatic cancer, hypothyroidism, hypertension, generalized anxiety disorder, history of venous thromboembolic disease, recent hospitalization for sepsis with Morganella morganii/E. coli bacteremia who was sent to the hospital as a direct admission for evaluation of poor oral intake and severe dehydration. Patient's daughter stated that since her discharge her oral intake remained very poor.  Patient was also having persistent nausea and vomiting despite antiemetics.  She was not not able to hold any food, drink or medicine down.  She is getting progressively weak.   She was admitted to hospitalist service. Consult services obtained from ID, neurology, palliative care.  Patient was given IV hydration.  Electrolyte levels were monitored. See below for details of hospitalization. Patient ultimately chose to be comfort care. She is being discharged today to home with home hospice services.  Subjective:  Seen and briefly examined this morning.  Alert, awake, will answer simple questions.  Husband at bedside. Not in pain. DME delivered to home today.  Hospital Course:  Dehydration with hyponatremia -IV hydration provided.  Electrolytes monitored and replaced.   Recent cholecystitis S/p recent cholecystostomy tube insertion and perihepatic drain placement -In the last hospitalization, she was seen by IR and underwent cholecystostomy tube placement.  PEG tube in placement.  She followed up with IR as an outpatient and  underwent imaging as well. -This admission, cholecystostomy tube and perihepatic drain were removed. -ID consultation was obtained.  Patient was tried on antibiotics. -General surgery was consulted.  She was not deemed to be a candidate for any surgical intervention.  Acute metabolic encephalopathy -Multifactorial from combination of narcotics, benzos, AKI, prolonged hospitalization.  Neurology consult appreciated.  AKI (acute kidney injury) Recent Labs    08/22/21 0425 08/23/21 0456 08/24/21 0342 08/25/21 0159 08/26/21 0049 08/27/21 0450 08/28/21 0053 08/29/21 1252 08/30/21 0100 08/31/21 0416  BUN 38* 39* 40* 40* 42* 40* 41* 42* 44* 43*  CREATININE 1.32* 1.48* 1.55* 1.65* 1.72* 1.71* 1.66* 1.54* 1.54* 1.50*   Diet-controlled diabetes mellitus   Essential hypertension  Acquired hypothyroidism   Decubitus skin ulcer   Acute urinary retention -Bladder scan done 08/26/2021 with 1200 cc of urine noted per RN. -I and O cath done on 08/26/2021 with 900 cc of urine output. -Serial bladder scans, I and O cath every 6 hours as needed was ordered.. -Foley catheter placed due to urinary retention.  Can maintain Foley catheter at discharge.   Oral thrush -Patient with some concerns for oral thrush. -Continue nystatin swish and swallow.   Protein-calorie malnutrition, severe ([place]) - Patient with severe protein calorie malnutrition with albumin level < 1.5. -Likely secondary to metastatic pancreatic cancer in the setting of poor oral intake secondary to nausea.   VTE (venous thromboembolism) Patient noted to have bilateral segmental and subsegmental pulmonary emboli  Currently on apixaban.  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 Benay Spice -Getting discharged to home with hospice today.  Goals of care - -  Code Status: DNR  Nutritional status:   Body mass index is 28.43 kg/m.      Diet:  Diet Order             Diet general           Diet regular Room service appropriate? Yes with Assist; Fluid consistency: Thin  Diet effective now                    Wounds:  - Pressure Injury 06/30/21 Thigh Posterior;Proximal;Right Stage 1 -  Intact skin with non-blanchable redness of a localized area usually over a bony prominence. Intact darkened skin right gluteal fold (Active)  Date First Assessed/Time First Assessed: 06/30/21 2000   Location: Thigh  Location Orientation: Posterior;Proximal;Right  Staging: Stage 1 -  Intact skin with non-blanchable redness of a localized area usually over a bony prominence.  Wound Descriptio...    Assessments 06/30/2021  8:00 PM 08/30/2021  9:09 PM  Dressing Type None None  Dressing -- Dry  Site / Wound Assessment Clean;Dry;Brown;Pink Clean;Dry  Peri-wound Assessment Intact Intact  Wound Length (cm) 4 cm --  Wound Width (cm) 8 cm --  Wound Depth (cm) 0 cm --  Wound Surface Area (cm^2) 32 cm^2 --  Wound Volume (cm^3) 0 cm^3 --  Tunneling (cm) 0 --  Undermining (cm) 0 --  Margins Attached edges (approximated) --  Drainage Amount None None     No associated orders.    Discharge Exam:   Vitals:   08/30/21 1703 08/30/21 2025 08/31/21 0406 08/31/21 1031  BP:  131/86 133/87 (!) 130/109  Pulse: (!) 102 (!) 110 (!) 110 (!) 109  Resp:  17  20  Temp:  98.7 F (37.1 C) 98.5 F (36.9 C) 97.7 F (36.5 C)  TempSrc:  Oral Oral   SpO2:  96% 96% 95%  Weight:      Height:        Body mass index is 28.43 kg/m.  General exam: Elderly Caucasian female.  Not in distress.  Not in pain.  Did not do a detailed examination because of Comfort Care status.  Follow ups:    Follow-up Information     [facility] Follow up.   Specialty: [place] Why: For home health services They will call you in 1-2 days to set up your first home visit Contact information: [place] [address] [phone]         [facility] Follow up.   Contact information: [place] [address] [phone]        Greggory Keen, MD Follow up in 8 week(s).   Specialties: Interventional Radiology, Radiology Why: pt will hear from IR scheduler for [facility] 8 week follow up; call [phone] if any questions/concerns; continue to flush drain daily Contact information: [address] [place] [address] [phone]         [name], MD Follow up.   Specialty: Family Medicine Contact information: [place] [address] [phone]         Ladell Pier, MD .   Specialty: Oncology Contact information: [address] [place] [address] [phone]                 Discharge Instructions:   Discharge Instructions     Activity as tolerated - No restrictions   Complete by: As directed    Call MD for:   Complete  by: As directed    Please get in touch with hospice MD/nurse for any symptom control.   Diet general   Complete by: As directed    If patient is alert and awake enough to eat, can allow luxury feeding.   Discharge instructions   Complete by: As directed    Medicines intended for comfort and pain management prescribed. Rest of the care per hospice policy.   No wound care   Complete by: As directed        Discharge Medications:   Allergies as of 08/31/2021       Reactions   Penicillins Hives, Other (See Comments)   Tolerated Ceftriaxone 2023 Has patient had a PCN reaction causing immediate rash, facial/tongue/throat swelling, SOB or lightheadedness with hypotension: No Has patient had a PCN reaction causing severe rash involving mucus membranes or skin necrosis: No Has patient had a PCN reaction that required hospitalization No Has patient had a PCN reaction occurring within the last 10 years: No If all of the  above answers are "NO", then may proceed with Cephalosporin use.        Medication List     STOP taking these medications    Accu-Chek Aviva Plus test strip Generic drug: glucose blood   Accu-Chek Softclix Lancets lancets   Alcohol Swabs Pads   amLODipine 5 MG tablet Commonly known as: NORVASC   apixaban 5 MG Tabs tablet Commonly known as: ELIQUIS   ceFEPIme 2 g injection Commonly known as: MAXIPIME   cyanocobalamin 1000 MCG/ML injection Commonly known as: (VITAMIN B-12)   levothyroxine 125 MCG tablet Commonly known as: SYNTHROID   metroNIDAZOLE 500 MG tablet Commonly known as: FLAGYL   multivitamin with minerals Tabs tablet   traMADol 50 MG tablet Commonly known as: ULTRAM       TAKE these medications    Accu-Chek Guide w/Device Kit As needed   ALPRAZolam 0.25 MG tablet Commonly known as: XANAX Take 0.25 mg by mouth 2 (two) times daily as needed for anxiety.   calcium carbonate 750 MG chewable tablet Commonly known as: TUMS EX Chew 1 tablet by mouth daily.   docusate sodium 100 MG capsule Commonly known as: COLACE Take 100 mg by mouth 2 (two) times daily as needed (constipation).   famotidine 20 MG tablet Commonly known as: PEPCID Take 1 tablet (20 mg total) by mouth daily.   lidocaine-prilocaine cream Commonly known as: EMLA APPLY 1 APPLICATION TOPICALLY AS NEEDED. What changed: See the new instructions.   LORazepam 0.5 MG tablet Commonly known as: ATIVAN Take 0.5 mg by mouth as needed for anxiety.   omeprazole 20 MG capsule Commonly known as: PRILOSEC TAKE 1 CAPSULE BY MOUTH EVERY DAY What changed:  how much to take when to take this   oxyCODONE 5 MG immediate release tablet Commonly known as: Oxy IR/ROXICODONE Take 5 mg by mouth 3 (three) times daily as needed for moderate pain or severe pain.   oxyCODONE-acetaminophen 5-325 MG tablet Commonly known as: PERCOCET/ROXICET Take 1-2 tablets by mouth every 6 (six) hours as needed  for severe pain.   polyethylene glycol powder 17 GM/SCOOP powder Commonly known as: GLYCOLAX/MIRALAX Take 17 g by mouth daily. What changed:  when to take this reasons to take this   prochlorperazine 10 MG tablet Commonly known as: COMPAZINE Take 1 tablet (10 mg total) by mouth every 6 (six) hours as needed (Nausea or vomiting).   sodium chloride 0.9 % infusion Inject into the vein.  Durable Medical Equipment  (From admission, onward)           Start     Ordered   08/29/21 1810  For home use only DME Air overlay mattress  Once        08/29/21 1809   08/22/21 1056  For home use only DME Walker rolling  Once       Question Answer Comment  Walker: With [place] Wheels   Patient needs a walker to treat with the following condition Weakness      08/22/21 1057   08/22/21 1056  For home use only DME Hospital bed  Once       Comments: Required therapeutic mattress Height 5'3" Weight 73KG  Question Answer Comment  Length of Need Lifetime   Patient has (list medical condition): debility, bed bound   The above medical condition requires: Patient requires the ability to reposition frequently   Head must be elevated greater than: 30 degrees   Bed type Semi-electric   Hoyer Lift Yes      08/22/21 1057             The results of significant diagnostics from this hospitalization (including imaging, microbiology, ancillary and laboratory) are listed below for reference.    Procedures and Diagnostic Studies:   CT ABDOMEN PELVIS W CONTRAST  Result Date: 08/21/2021 CLINICAL DATA:  Cholecystitis status post cholecystostomy tube placement. History of pancreatic cancer. EXAM: CT ABDOMEN AND PELVIS WITH CONTRAST TECHNIQUE: Multidetector CT imaging of the abdomen and pelvis was performed using the standard protocol following bolus administration of intravenous contrast. RADIATION DOSE REDUCTION: This exam was performed according to the departmental  dose-optimization program which includes automated exposure control, adjustment of the mA and/or kV according to patient size and/or use of iterative reconstruction technique. CONTRAST:  135m OMNIPAQUE IOHEXOL 300 MG/ML  SOLN COMPARISON:  CT abdomen pelvis dated August 16, 2021. FINDINGS: Lower chest: Unchanged moderate right and small left pleural effusions with adjacent atelectasis in both posterior lower lobes. Hepatobiliary: Multiple hypodense liver lesions are unchanged. Right perihepatic drainage catheter remains in place without significant residual perihepatic fluid collection. 2.2 cm posteroinferior perihepatic fluid collection is unchanged (series 3, image 20). New small amount of pneumobilia in the left liver. Multiple gallstones again noted with slightly increased distention of the gallbladder status post cholecystostomy tube placement. Unchanged common bile duct stent. Pancreas: Unchanged hypodense mass in the pancreatic neck with atrophy of the body and tail. Spleen: Unchanged wedge-shaped hypodensity in the inferior spleen, likely a small infarct. Normal in size. Adrenals/Urinary Tract: Adrenal glands are unremarkable. Unchanged small right renal simple cysts. No follow-up imaging is recommended. No renal calculi or hydronephrosis. The bladder is distended with excreted contrast. Stomach/Bowel: Unchanged small hiatal hernia. The stomach is otherwise within normal limits. No bowel wall thickening, distention, or surrounding inflammatory changes. Scattered mild colonic diverticulosis. Similar moderate stool burden throughout the colon. Normal appendix. 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 Aortoiliac atherosclerotic vascular disease. No enlarged abdominal or pelvic lymph nodes. Reproductive: Status post hysterectomy. No adnexal masses. Other: Decreased small volume ascites  in the pelvis. No pneumoperitoneum. Musculoskeletal: No acute or significant osseous findings. Unchanged anasarca. IMPRESSION: 1. Status post cholecystostomy tube placement with slightly increased distention of the gallbladder compared to prior study. Correlate with tube function. 2. Right perihepatic drainage catheter remains in place without significant residual perihepatic fluid collection around the drain.  Unchanged 2.2 cm posteroinferior perihepatic fluid collection separate from the drain. 3. 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 4. Decreased small volume ascites in the pelvis. 5. Unchanged moderate right and small left pleural effusions. 6. 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 7. Aortic Atherosclerosis (ICD10-I70.0). Electronically Signed   By: Titus Dubin M.D.   On: 08/21/2021 17:58   US RENAL  Result Date: 08/21/2021 CLINICAL DATA:  Acute kidney injury EXAM: RENAL / URINARY TRACT ULTRASOUND COMPLETE COMPARISON:  CT abdomen 08/16/2021 FINDINGS: Right Kidney: Renal measurements: 9.5 by 4.0 by 5.0 cm = volume: 102 mL. Mildly hyperechoic relative to the liver. No solid mass or hydronephrosis visualized. A right kidney upper pole cyst measures 2.0 by 3.0 by 2 2.6 cm and appears simple. Left Kidney: Renal measurements: 9.7 by 4.0 by 4.8 cm = volume: 99 mL. Mildly hyperechoic relative to the spleen. No mass or hydronephrosis visualized. Bladder: Appears normal for degree of bladder distention. A left ureteral jet is visualized. Ascites in the lower abdomen. IMPRESSION: 1. Bilateral renal parenchymal hyperechogenicity suggesting chronic medical renal disease. 2. Right kidney upper pole simple appearing cyst. 3. A left ureteral jet is visualized in the urinary bladder. Right ureteral jet not seen, although this may simply be incidental. 4. Pelvic ascites. Electronically  Signed   By: Van Clines M.D.   On: 08/21/2021 16:00   DG CHEST PORT 1 VIEW  Result Date: 08/20/2021 CLINICAL DATA:  Status post PICC line placement. EXAM: PORTABLE CHEST 1 VIEW COMPARISON:  07/27/2021. FINDINGS: The heart size and mediastinal contours are within normal limits. There is atherosclerotic calcification of the aorta. Patchy airspace disease is noted at the right lung base. There are questionable small bilateral pleural effusions. No pneumothorax. A right chest port is stable. No PICC line is identified. A stable pigtail catheter is noted in the right upper quadrant. No acute osseous abnormality. IMPRESSION: 1. Small bilateral pleural effusions with atelectasis at the lung bases. 2. No PICC line is identified.  Clinical correlation is recommended. Electronically Signed   By: Brett Fairy M.D.   On: 08/20/2021 23:45     Labs:   Basic Metabolic Panel: Recent Labs  Lab 08/25/21 0159 08/26/21 0049 08/27/21 0450 08/28/21 0053 08/29/21 1252 08/30/21 0100 08/31/21 0416  NA 132*   < > 135 134* 135 137 137  K 3.9   < > 4.3 4.4 4.2 4.4 4.3  CL 105   < > 107 106 107 109 110  CO2 23   < > 21* 20* 21* 21* 20*  GLUCOSE 154*   < > 151* 121* 114* 119* 82  BUN 40*   < > 40* 41* 42* 44* 43*  CREATININE 1.65*   < > 1.71* 1.66* 1.54* 1.54* 1.50*  CALCIUM 7.9*   < > 7.7* 8.1* 8.1* 8.1* 8.1*  MG 2.0  --  2.0  --   --   --   --   PHOS 3.1  --  3.6 4.0  --  3.9  --    < > = values in this interval not displayed.   GFR Estimated Creatinine Clearance: 29.6 mL/min (A) (by C-G formula based on SCr of 1.5 mg/dL (H)). Liver Function Tests: Recent Labs  Lab 08/25/21 0159 08/27/21 0450 08/28/21 0053 08/30/21 0100  AST 46*  --   --   --   ALT 23  --   --   --  ALKPHOS 593*  --   --   --   BILITOT 0.7  --   --   --   PROT 5.2*  --   --   --   ALBUMIN 1.9* 2.0* 2.5* 2.8*   No results for input(s): "LIPASE", "AMYLASE" in the last 168 hours. No results for input(s): "AMMONIA" in the  last 168 hours. Coagulation profile No results for input(s): "INR", "PROTIME" in the last 168 hours.  CBC: Recent Labs  Lab 08/27/21 0450 08/28/21 0053 08/29/21 1252 08/30/21 0100 08/31/21 0416  WBC 7.0 8.1 8.5 7.2 8.5  NEUTROABS 5.2 6.5 6.3  --   --   HGB 9.6* 8.8* 9.1* 7.6* 8.3*  HCT 29.3* 27.1* 27.8* 23.2* 25.6*  MCV 96.7 97.1 98.2 96.7 97.0  PLT 98* 98* 117* 110* 137*   Cardiac Enzymes: No results for input(s): "CKTOTAL", "CKMB", "CKMBINDEX", "TROPONINI" in the last 168 hours. BNP: Invalid input(s): "POCBNP" CBG: No results for input(s): "GLUCAP" in the last 168 hours. D-Dimer No results for input(s): "DDIMER" in the last 72 hours. Hgb A1c No results for input(s): "HGBA1C" in the last 72 hours. Lipid Profile No results for input(s): "CHOL", "HDL", "LDLCALC", "TRIG", "CHOLHDL", "LDLDIRECT" in the last 72 hours. Thyroid function studies No results for input(s): "TSH", "T4TOTAL", "T3FREE", "THYROIDAB" in the last 72 hours.  Invalid input(s): "FREET3" Anemia work up No results for input(s): "VITAMINB12", "FOLATE", "FERRITIN", "TIBC", "IRON", "RETICCTPCT" in the last 72 hours. Microbiology Recent Results (from the past 240 hour(s))  Urine Culture     Status: Abnormal   Collection Time: 08/23/21  8:22 AM   Specimen: Urine, Catheterized  Result Value Ref Range Status   Specimen Description URINE, CATHETERIZED  Final   Special Requests NONE  Final   Culture (A)  Final    >=100,000 COLONIES/mL ENTEROCOCCUS FAECIUM 20,000 COLONIES/mL ACHROMOBACTER XYLOSOXIDANS MULTI-DRUG RESISTANT ORGANISM NOTIFIED DR. [facility] VIA EPIC CHAT REGARDING RESITANT SUSCEPTIBILITY PATTERN Performed at [facility], [place] [address] [place], [place] [address]    Report Status 08/29/2021 FINAL  Final   Organism ID, Bacteria ENTEROCOCCUS FAECIUM (A)  Final   Organism ID, Bacteria ACHROMOBACTER XYLOSOXIDANS (A)  Final      Susceptibility   Enterococcus faecium - MIC*    AMPICILLIN >=32  RESISTANT Resistant     LEVOFLOXACIN 2 SENSITIVE Sensitive     NITROFURANTOIN 128 RESISTANT Resistant     VANCOMYCIN <=0.5 SENSITIVE Sensitive     GENTAMICIN SYNERGY SENSITIVE Sensitive     * >=100,000 COLONIES/mL ENTEROCOCCUS FAECIUM   Achromobacter xylosoxidans - MIC*    CEFAZOLIN >=64 RESISTANT Resistant     GENTAMICIN >=16 RESISTANT Resistant     CIPROFLOXACIN >=4 RESISTANT Resistant     IMIPENEM 8 INTERMEDIATE Intermediate     TRIMETH/SULFA <=20 SENSITIVE Sensitive     * 20,000 COLONIES/mL ACHROMOBACTER XYLOSOXIDANS  Urine Culture     Status: Abnormal   Collection Time: 08/26/21  8:07 AM   Specimen: Urine, Clean Catch  Result Value Ref Range Status   Specimen Description URINE, CLEAN CATCH  Final   Special Requests   Final    NONE Performed at [facility], [place] [address] [place], [place] [address]    Culture MULTIPLE SPECIES PRESENT, SUGGEST RECOLLECTION (A)  Final   Report Status 08/28/2021 FINAL  Final    Time coordinating discharge: 35 minutes  Signed: Tanisia Yokley  [facility] 08/31/2021, 1:08 PM

## 2021-08-31 NOTE — Progress Notes (Signed)
Midline d/cd and port to remain accessed per [facility]

## 2021-08-31 NOTE — TOC Progression Note (Addendum)
Transition of Care [facility]) - Progression Note    Patient Details  Name: [name] MRN: [id number] Date of Birth: 11/22/1945  Transition of Care [facility]) CM/SW Contact  Toni Lefevre Edson Snowball, RN Phone Number: 08/31/2021, 12:32 PM  Clinical Narrative:     [name] has DME at home and is ready for her mom to come home.   [name] would like nurse to call when PTAR leaves hospital with her mom   PTAR called . PTAR paperwork and DNR on chart    [name] and [name] with [facility] aware  Expected Discharge Plan: [place] Barriers to Discharge: No Barriers Identified  Expected Discharge Plan and Services Expected Discharge Plan: [place]   Discharge Planning Services: CM Consult Post Acute Care Choice: Durable Medical Equipment, Home Health Living arrangements for the past 2 months: Single Family Home                 DME Arranged: Hospital bed, Walker rolling DME Agency: [employer] Date DME Agency Contacted: 08/22/21 Time DME Agency Contacted: [id number] Representative spoke with at DME Agency: [name] HH Arranged: RN, PT, OT, Nurse's Aide Spray Agency: [place] Date [place]: 08/22/21 Time [place]: 1191 Representative spoke with at [place]: [place] ([place]) Interventions    Readmission Risk Interventions     No data to display

## 2021-08-31 NOTE — Progress Notes (Signed)
Nsg Discharge Note  Admit Date:  08/20/2021 Discharge date: 08/31/2021   [name] to be D/C'd Home per MD order.  AVS completed. EMS came to pick up patient to transport to home. Patient/caregiver able to verbalize understanding.  Discharge Medication: Allergies as of 08/31/2021       Reactions   Penicillins Hives, Other (See Comments)   Tolerated Ceftriaxone 2023 Has patient had a PCN reaction causing immediate rash, facial/tongue/throat swelling, SOB or lightheadedness with hypotension: No Has patient had a PCN reaction causing severe rash involving mucus membranes or skin necrosis: No Has patient had a PCN reaction that required hospitalization No Has patient had a PCN reaction occurring within the last 10 years: No If all of the above answers are "NO", then may proceed with Cephalosporin use.        Medication List     STOP taking these medications    Accu-Chek Aviva Plus test strip Generic drug: glucose blood   Accu-Chek Softclix Lancets lancets   Alcohol Swabs Pads   amLODipine 5 MG tablet Commonly known as: NORVASC   apixaban 5 MG Tabs tablet Commonly known as: ELIQUIS   ceFEPIme 2 g injection Commonly known as: MAXIPIME   cyanocobalamin 1000 MCG/ML injection Commonly known as: (VITAMIN B-12)   levothyroxine 125 MCG tablet Commonly known as: SYNTHROID   metroNIDAZOLE 500 MG tablet Commonly known as: FLAGYL   multivitamin with minerals Tabs tablet   traMADol 50 MG tablet Commonly known as: ULTRAM       TAKE these medications    Accu-Chek Guide w/Device Kit As needed   ALPRAZolam 0.25 MG tablet Commonly known as: XANAX Take 0.25 mg by mouth 2 (two) times daily as needed for anxiety.   calcium carbonate 750 MG chewable tablet Commonly known as: TUMS EX Chew 1 tablet by mouth daily.   docusate sodium 100 MG capsule Commonly known as: COLACE Take 100 mg by mouth 2 (two) times daily as needed (constipation).   famotidine 20 MG  tablet Commonly known as: PEPCID Take 1 tablet (20 mg total) by mouth daily.   lidocaine-prilocaine cream Commonly known as: EMLA APPLY 1 APPLICATION TOPICALLY AS NEEDED. What changed: See the new instructions.   LORazepam 0.5 MG tablet Commonly known as: ATIVAN Take 0.5 mg by mouth as needed for anxiety.   omeprazole 20 MG capsule Commonly known as: PRILOSEC TAKE 1 CAPSULE BY MOUTH EVERY DAY What changed:  how much to take when to take this   oxyCODONE 5 MG immediate release tablet Commonly known as: Oxy IR/ROXICODONE Take 5 mg by mouth 3 (three) times daily as needed for moderate pain or severe pain.   oxyCODONE-acetaminophen 5-325 MG tablet Commonly known as: PERCOCET/ROXICET Take 1-2 tablets by mouth every 6 (six) hours as needed for severe pain.   polyethylene glycol powder 17 GM/SCOOP powder Commonly known as: GLYCOLAX/MIRALAX Take 17 g by mouth daily. What changed:  when to take this reasons to take this   prochlorperazine 10 MG tablet Commonly known as: COMPAZINE Take 1 tablet (10 mg total) by mouth every 6 (six) hours as needed (Nausea or vomiting).   sodium chloride 0.9 % infusion Inject into the vein.               Durable Medical Equipment  (From admission, onward)           Start     Ordered   08/29/21 1810  For home use only DME Air overlay mattress  Once  08/29/21 1809   08/22/21 1056  For home use only DME Walker rolling  Once       Question Answer Comment  Walker: With 5 Inch Wheels   Patient needs a walker to treat with the following condition Weakness      08/22/21 1057   08/22/21 1056  For home use only DME Hospital bed  Once       Comments: Required therapeutic mattress Height 5'3" Weight 73KG  Question Answer Comment  Length of Need Lifetime   Patient has (list medical condition): debility, bed bound   The above medical condition requires: Patient requires the ability to reposition frequently   Head must be elevated  greater than: 30 degrees   Bed type Semi-electric   Hoyer Lift Yes      08/22/21 1057            Discharge Assessment: Vitals:   08/31/21 0406 08/31/21 1031  BP: 133/87 (!) 130/109  Pulse: (!) 110 (!) 109  Resp:  20  Temp: 98.5 F (36.9 C) 97.7 F (36.5 C)  SpO2: 96% 95%   Skin clean, dry and intact without evidence of skin break down, no evidence of skin tears noted. IV catheter discontinued intact. Site without signs and symptoms of complications - no redness or edema noted at insertion site, patient denies c/o pain - only slight tenderness at site.  Dressing with slight pressure applied.  D/c Instructions-Education: Discharge instructions given to patient/family with verbalized understanding. D/c education completed with patient/family including follow up instructions, medication list, d/c activities limitations if indicated, with other d/c instructions as indicated by MD - patient able to verbalize understanding, all questions fully answered. Patient instructed to return to ED, call 911, or call MD for any changes in condition.  Patient escorted via [place], and D/C home via private auto.  Santa Lighter, RN 08/31/2021 3:46 PM

## 2021-08-31 NOTE — Progress Notes (Signed)
Per Dr. Benay Spice: F/U in 2-3 weeks in person or via video call, whatever daughter prefers. Scheduling message sent.

## 2021-09-03 ENCOUNTER — Telehealth: Payer: Self-pay | Admitting: Oncology

## 2021-09-03 ENCOUNTER — Telehealth: Payer: Self-pay

## 2021-09-03 NOTE — Telephone Encounter (Signed)
Transition Care Management Unsuccessful Follow-up Telephone Call  Date of discharge and from where:   08/31/21  Attempts:  1st Attempt  Reason for unsuccessful TCM follow-up call:  Unable to leave message

## 2021-09-03 NOTE — Telephone Encounter (Signed)
Attempted to contact patient daughter in regards to in basket message to schedule OV with Dr. Benay Spice. No answer and voicemail was full unable to leave message.

## 2021-09-04 ENCOUNTER — Telehealth: Payer: Self-pay | Admitting: *Deleted

## 2021-09-04 NOTE — Telephone Encounter (Signed)
Daughter, [name] was contacted by telephone to verify understanding of discharge instructions status post their most recent discharge from the hospital on the date:  08/31/2021.  Inpatient discharge AVS was re-reviewed with daughter, along with cancer center appointments for telephone or video visit in 3 weeks. Verification of understanding for oncology specific follow-up was validated using the Teach Back method.   She reports pain is controlled and she is alert. Not eating much, but takes some fluids. Not able to have home health nurse with [facility]. Informed her to ask [facility] of list of home health aids and they could hire one out-of-pocket to help provide some care and assistance with ADLs. She plans to inquire about this. Transportation to appointments were confirmed for the patient as being  N/A. Not able to come to office  .  Questions were addressed to their satisfaction upon completion of this post discharge follow-up call for outpatient oncology. Scheduling will call her to set up 3 week telephone visit with Dr. Benay Spice. Prefers afternoons after 1 pm. Scheduler notified.

## 2021-09-05 ENCOUNTER — Inpatient Hospital Stay: Payer: Medicare HMO | Admitting: Infectious Diseases

## 2021-09-10 ENCOUNTER — Telehealth: Payer: Self-pay | Admitting: Oncology

## 2021-09-15 DEATH — deceased

## 2021-10-17 ENCOUNTER — Ambulatory Visit: Payer: Medicare HMO | Admitting: Family Medicine

## 2021-11-26 ENCOUNTER — Other Ambulatory Visit: Payer: Self-pay | Admitting: Family Medicine

## 2021-12-10 ENCOUNTER — Ambulatory Visit: Payer: Medicare HMO

## 2023-05-06 IMAGING — CT CT ABD-PELV W/ CM
2 of 5 series · 15 of 46 positions shown, 17 images · IV contrast (APPLIED)
Comparison: 12/18/2020.

CLINICAL DATA: Metastatic pancreatic cancer, restaging. Currently
on chemotherapy.

EXAM:
CT ABDOMEN AND PELVIS WITH CONTRAST
TECHNIQUE: Multidetector CT imaging of the abdomen and pelvis was performed
using the standard protocol following bolus administration of
intravenous contrast.

[Series 2: abd pel w · axial · 0.81mm/px · z∈[+814,+1194]mm · 12 of 86 slices shown, 14 images]
[im 5/86  soft-tissue]
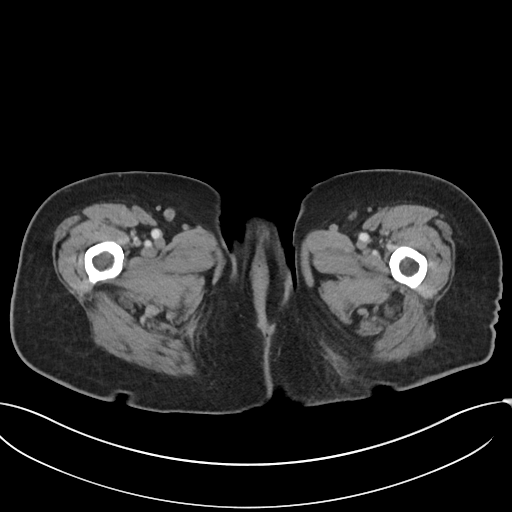
[im 5/86  bone]
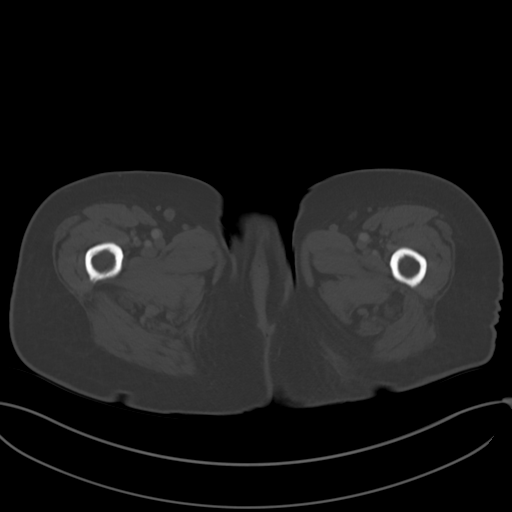
[im 15/86  soft-tissue]
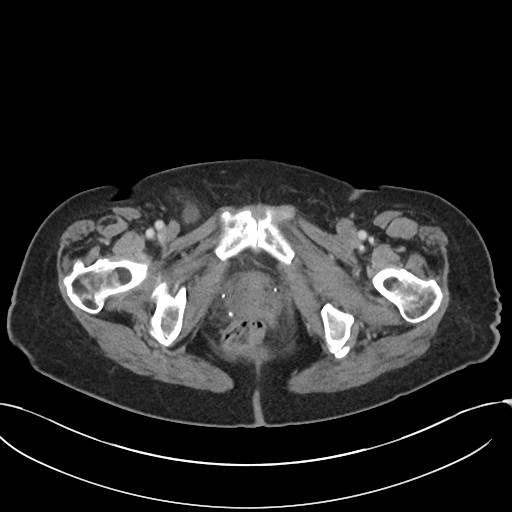
[im 19/86  soft-tissue]
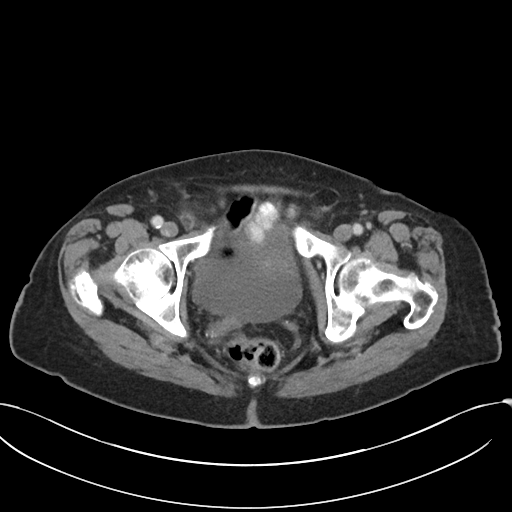
[im 24/86  soft-tissue]
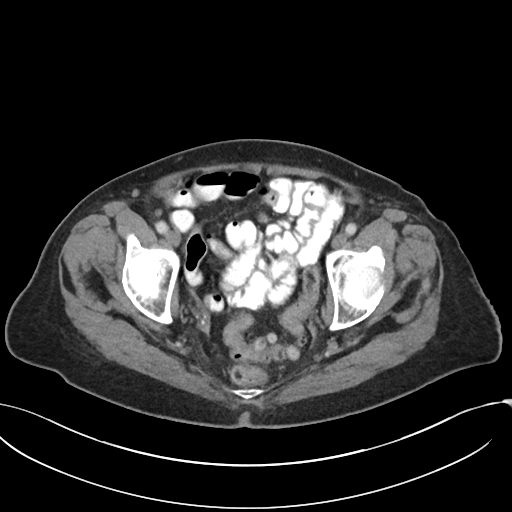
[im 34/86  soft-tissue]
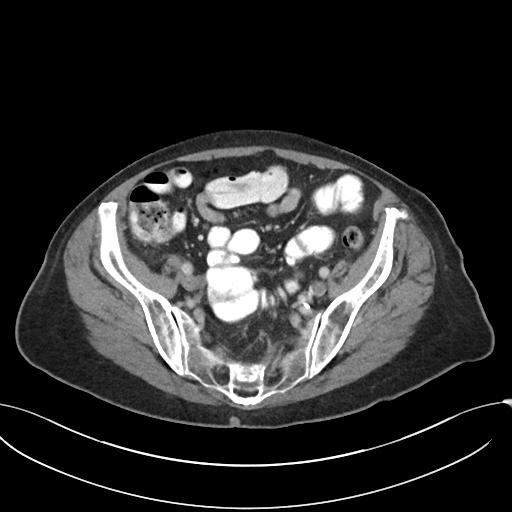
[im 38/86  soft-tissue]
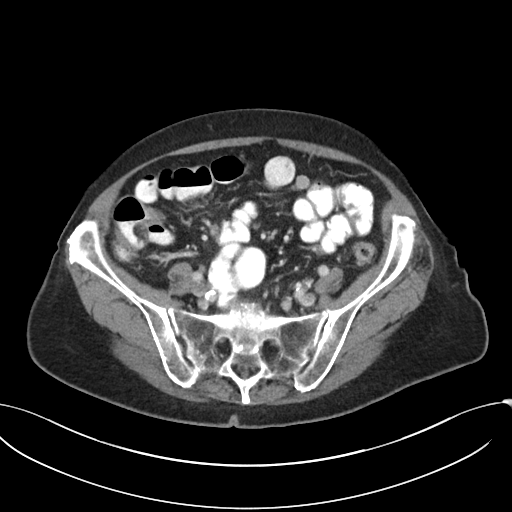
[im 48/86  soft-tissue]
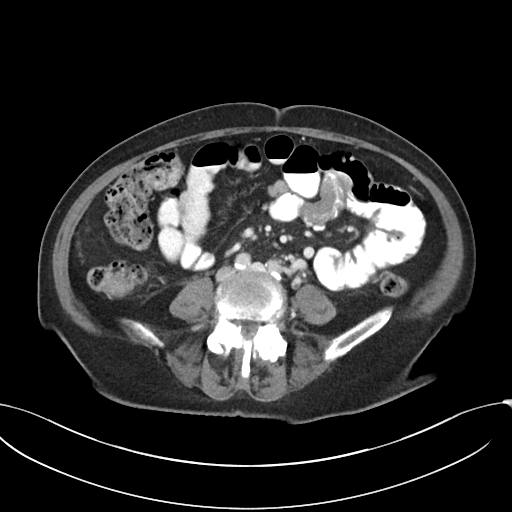
[im 52/86  soft-tissue]
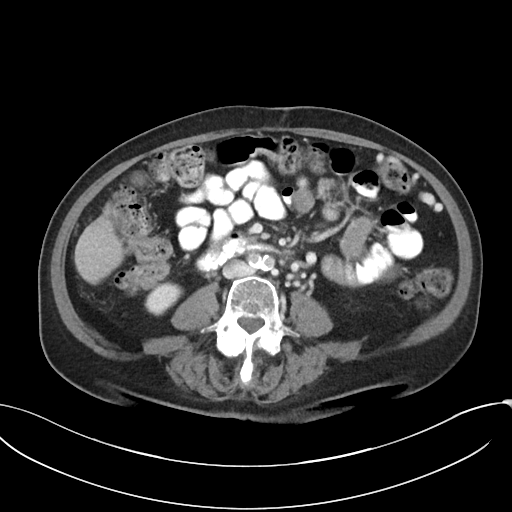
[im 62/86  soft-tissue]
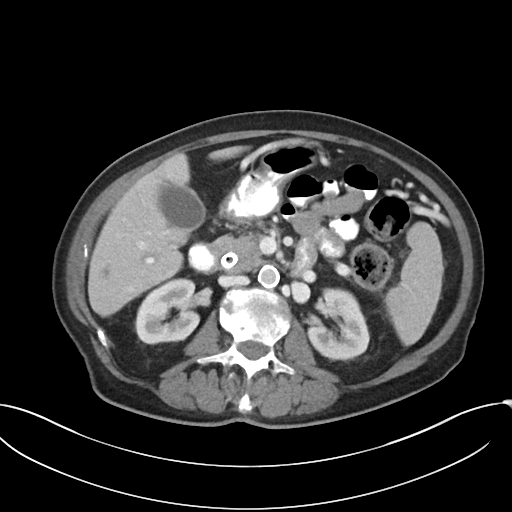
[im 62/86  bone]
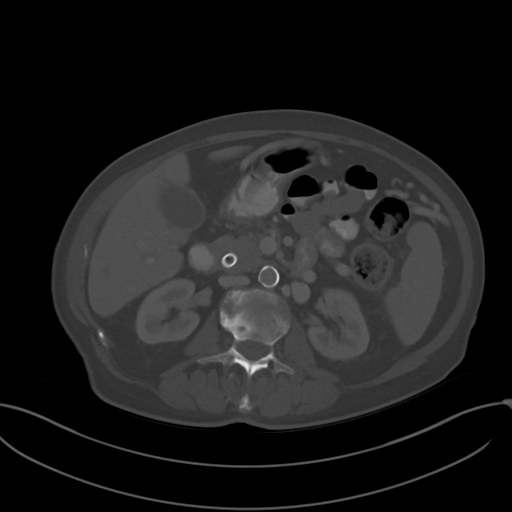
[im 67/86  soft-tissue]
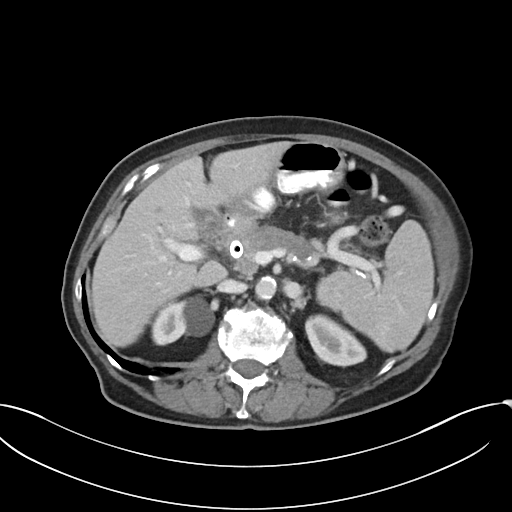
[im 71/86  soft-tissue]
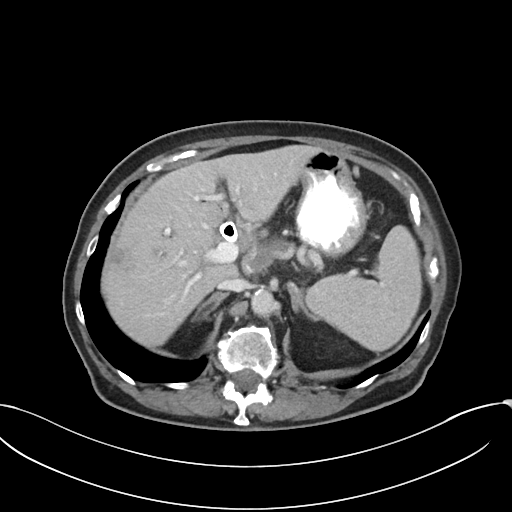
[im 81/86  soft-tissue]
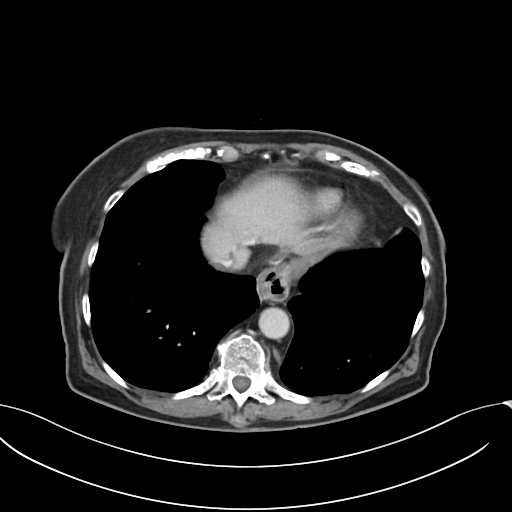

[Series 5: coronal · coronal · 0.79mm/px · 3 of 91 slices shown]
[im 31/91  soft-tissue]
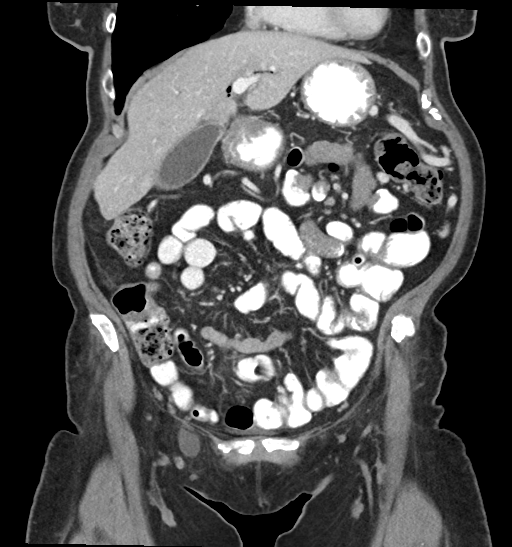
[im 41/91  soft-tissue]
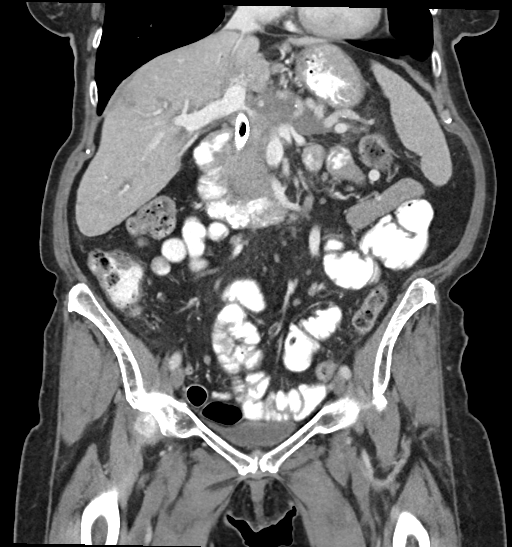
[im 51/91  soft-tissue]
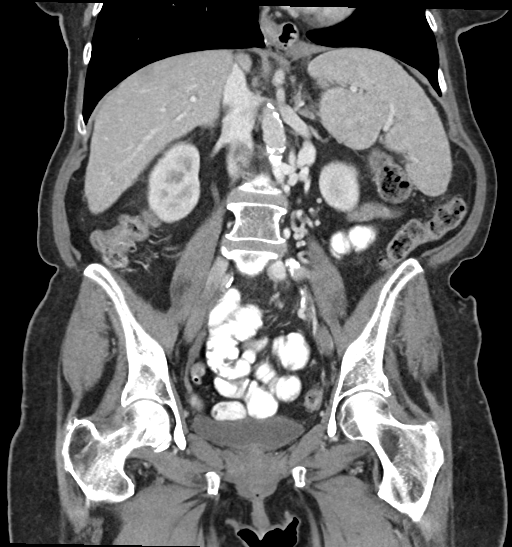

[15 of 46 positions shown; findings below may reference images not displayed]

RADIATION DOSE REDUCTION: This exam was performed according to the
departmental dose-optimization program which includes automated
exposure control, adjustment of the mA and/or kV according to
patient size and/or use of iterative reconstruction technique.

CONTRAST:  75mL OMNIPAQUE IOHEXOL 300 MG/ML  SOLN
FINDINGS: Lower chest: Minimal peribronchovascular nodularity in the right
lower lobe, likely postinfectious in etiology. Scattered peripheral
nodules in the left lower lobe measure up to 4 mm ([DATE]), unchanged.
Heart size normal. Atherosclerotic calcification of the aorta and
coronary arteries. No pericardial or pleural effusion. Distal
esophagus is grossly unremarkable. Small hiatal hernia.

Hepatobiliary: Vague areas of heterogeneous nodular low-attenuation
in the right hepatic lobe appear minimally improved. Index lesion in
the periphery measures 1.5 cm ([DATE]), previously 1.8 cm when
remeasured. Segment 4 lesion measures 7 mm ([DATE]), previously 10 mm.
Liver is slightly enlarged, 18.7 cm. Pneumobilia. Common bile duct
wall stent is in place. Gallbladder is unremarkable.

Pancreas: A slightly hypodense mass in the pancreatic
neck/body/proximal tail measures 1.8 x 5.6 cm ([DATE]), stable from
12/18/2020. Atrophy of the pancreatic tail. Mass narrows the portal
vein ([DATE]), is contiguous with the celiac trunk bifurcation,
encases/attenuates the splenic artery ([REDACTED]) and minimally
attenuates the superior mesenteric vein.

Spleen: Negative.

Adrenals/Urinary Tract: Right adrenal gland is unremarkable. There
may be slight nodular thickening of the body of the left adrenal
gland. Low-attenuation lesion in the upper pole right kidney
measures 3.4 cm, indicative of a cyst. Additional smaller
low-attenuation lesions in the kidneys are too small to definitively
characterize. Ureters are decompressed. Bladder is grossly
unremarkable.

Stomach/Bowel: Small hiatal hernia. Stomach, small bowel and
appendix are otherwise unremarkable. Fair amount of stool in the
colon is indicative of constipation. Colon is otherwise
unremarkable.

Vascular/Lymphatic: Atherosclerotic calcification of the aorta.
Incidental note is made of left gonadal varices. Vascular
involvement of the above described pancreatic mass is described in
that section. No pathologically enlarged lymph nodes. Small amount
of fluid lateral to the right inguinal canal, nonspecific (2/70).

Reproductive: Hysterectomy.  No adnexal mass.

Other: Small bilateral inguinal hernias contain fat. No free fluid.
Mesenteries and peritoneum are otherwise unremarkable.

Musculoskeletal: Degenerative changes in the spine. No worrisome
lytic or sclerotic lesions.
IMPRESSION: 1. Pancreatic mass and associated vascular involvement, stable from
[DATE]. Hepatic metastatic disease appears minimally improved in
the interval.
2. Mild hepatomegaly. Pneumobilia with common bile duct wall stent
in place.
3. Continued stability of tiny bilateral lower lobe pulmonary
nodules.
4. Aortic atherosclerosis ([id number]). Coronary artery
calcification.

## 2023-05-21 IMAGING — MG MM DIGITAL SCREENING BILAT W/ TOMO AND CAD
6 of 10 series · 6 of 30 positions shown · non-contrast
Comparison: Previous exam(s).

CLINICAL DATA: Screening.

EXAM:
DIGITAL SCREENING BILATERAL MAMMOGRAM WITH TOMOSYNTHESIS AND CAD
TECHNIQUE: Bilateral screening digital craniocaudal and mediolateral oblique
mammograms were obtained. Bilateral screening digital breast
tomosynthesis was performed. The images were evaluated with
computer-aided detection.

[L CC synth-2D]
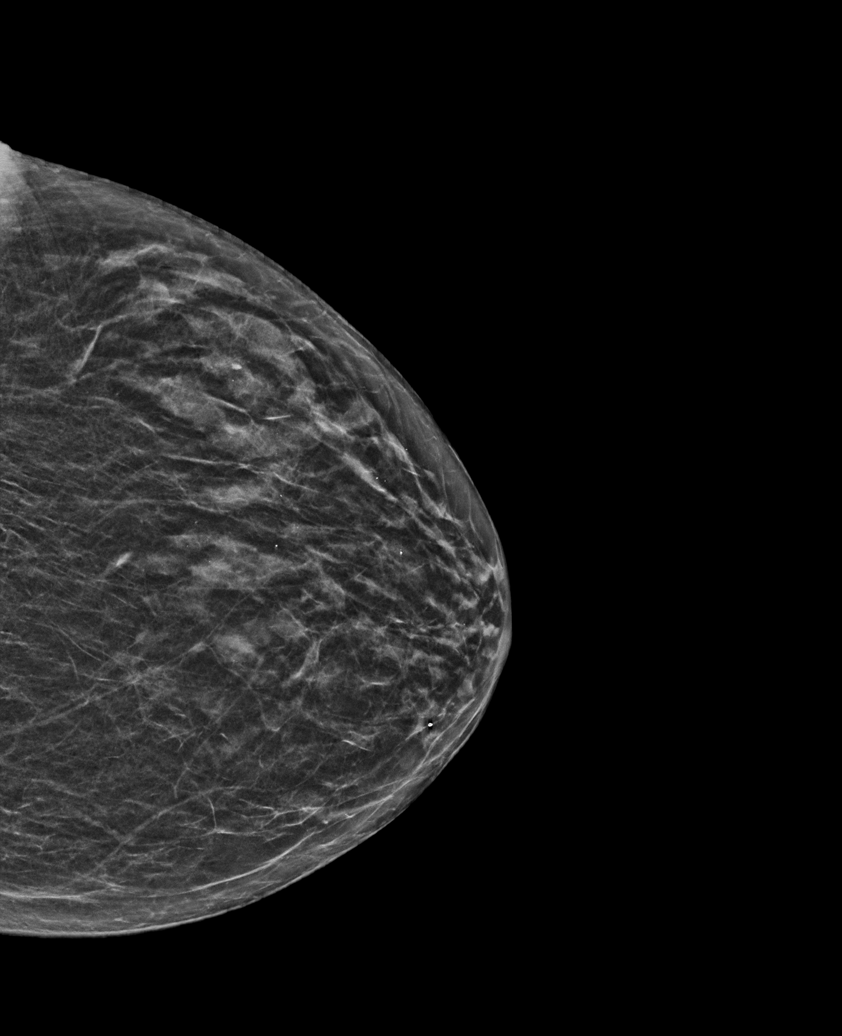

[L MLO synth-2D]
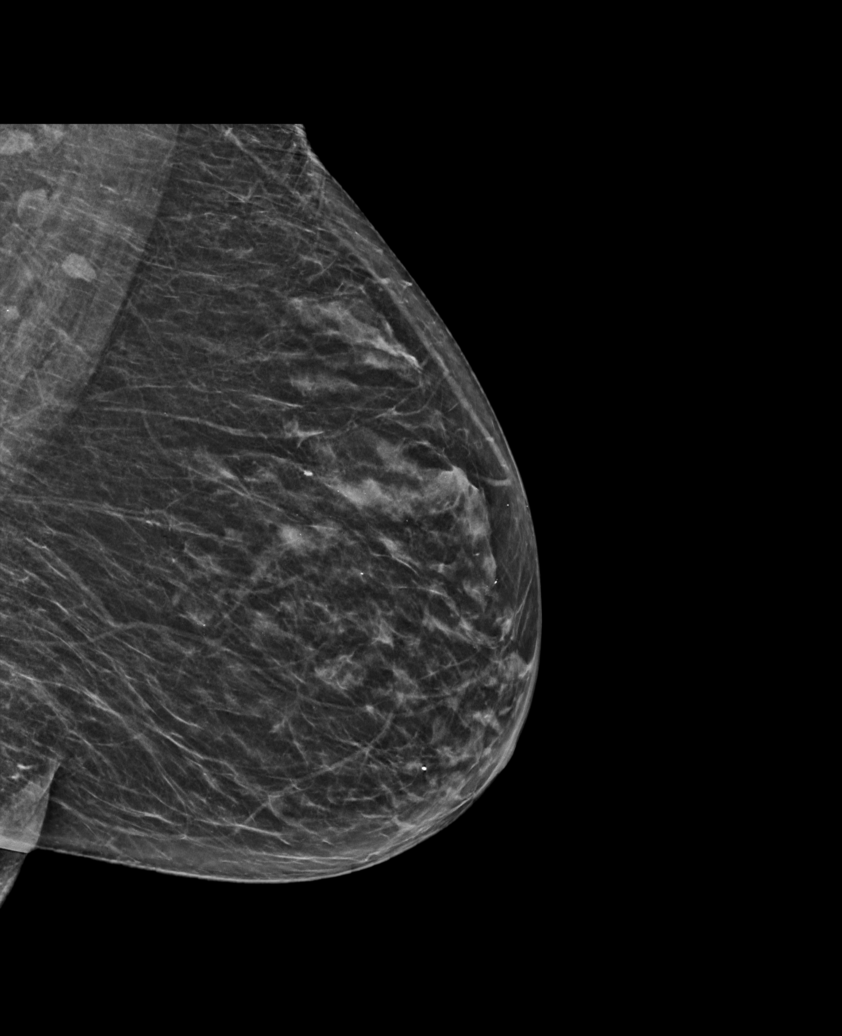

[R CC synth-2D]
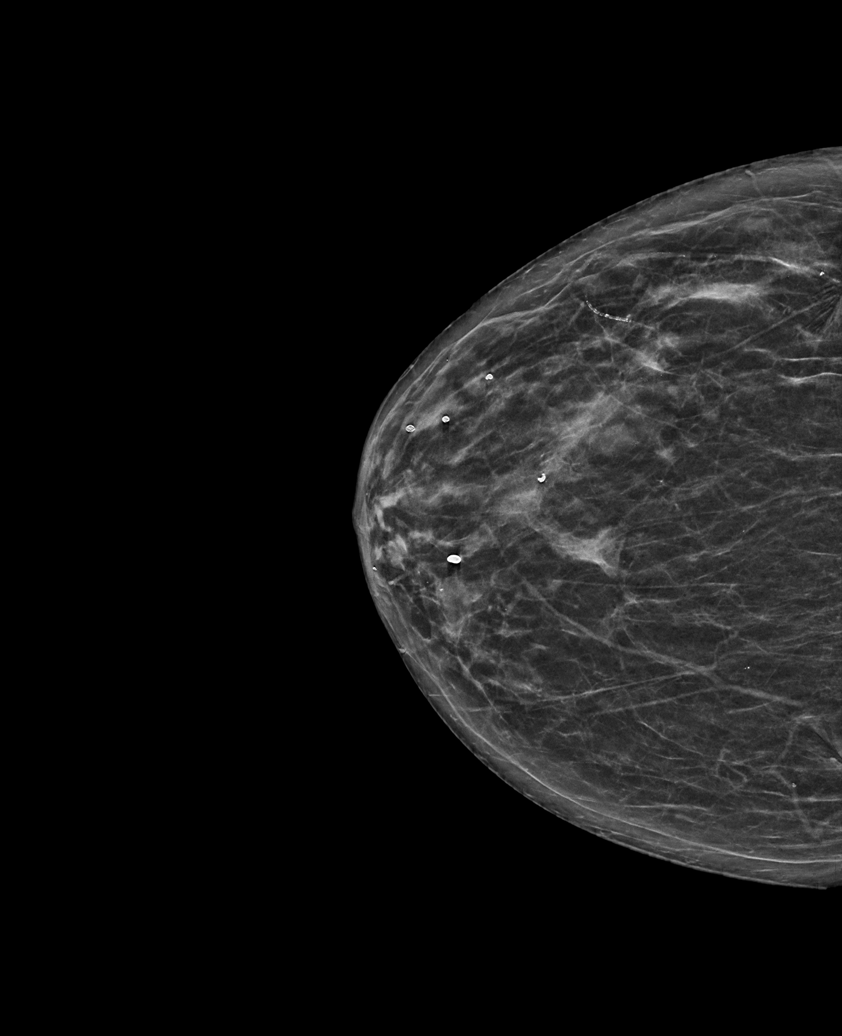

[R MLO synth-2D (1 of 2)]
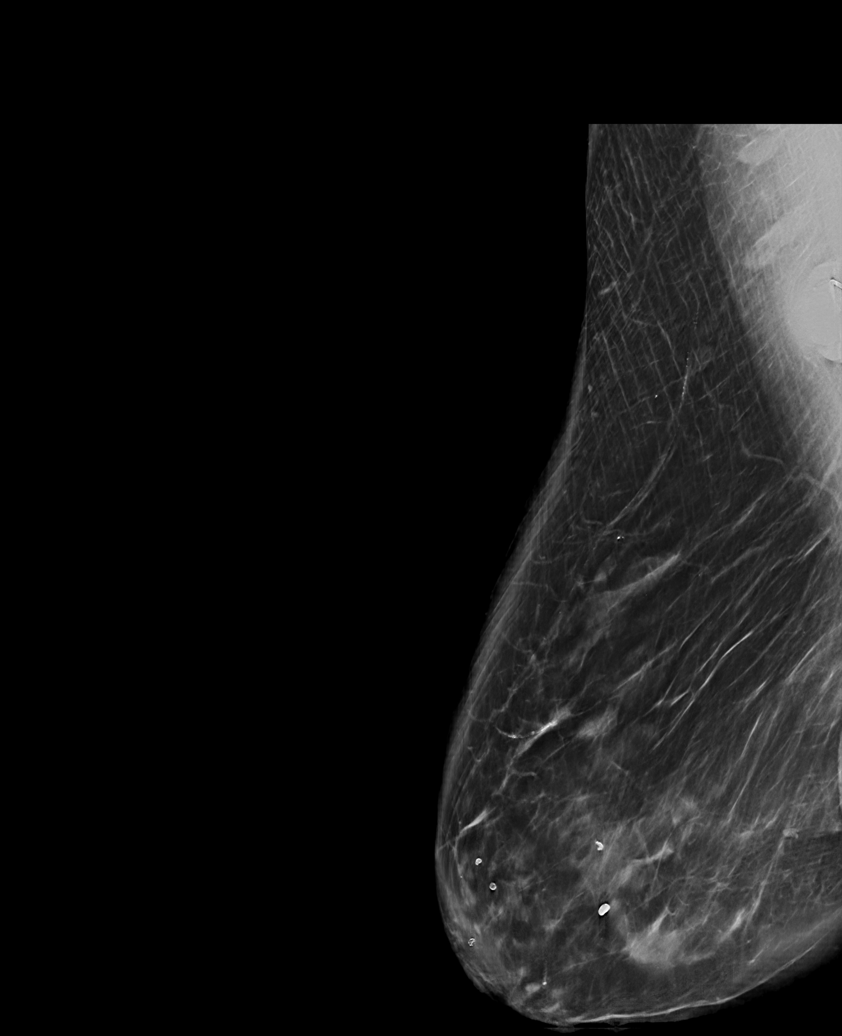

[R MLO synth-2D (2 of 2)]
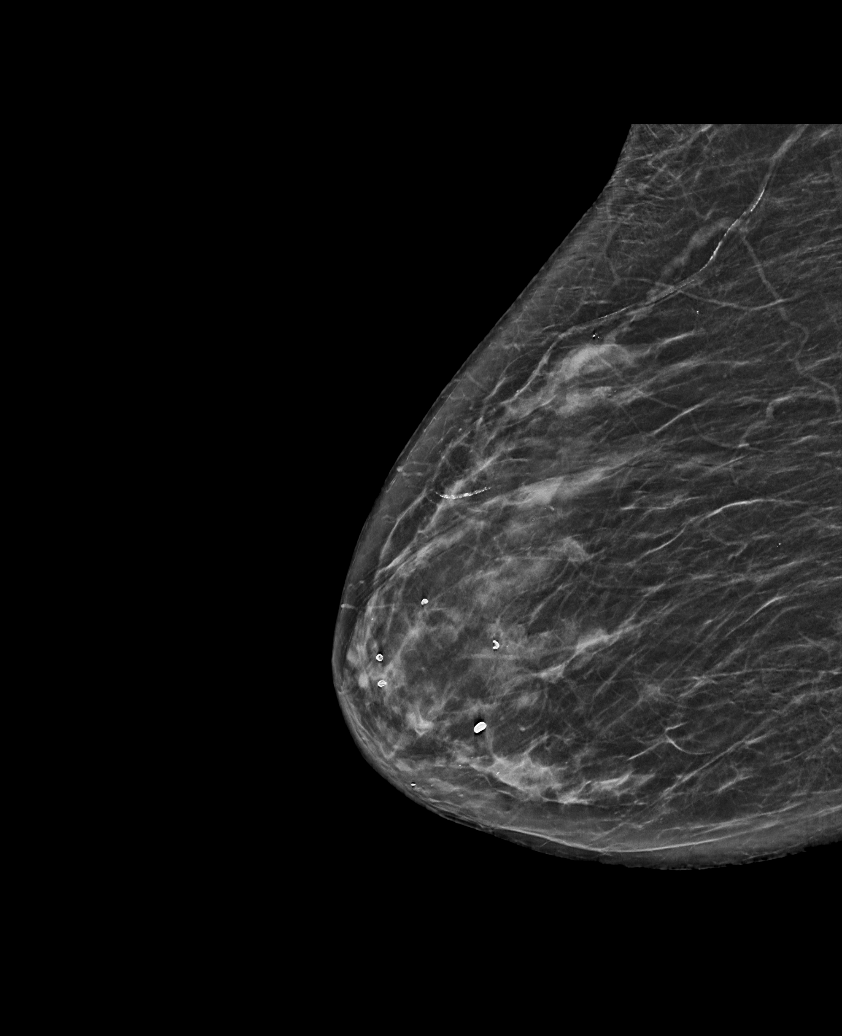

[L MLO tomo · tomo slice 28/55.0]
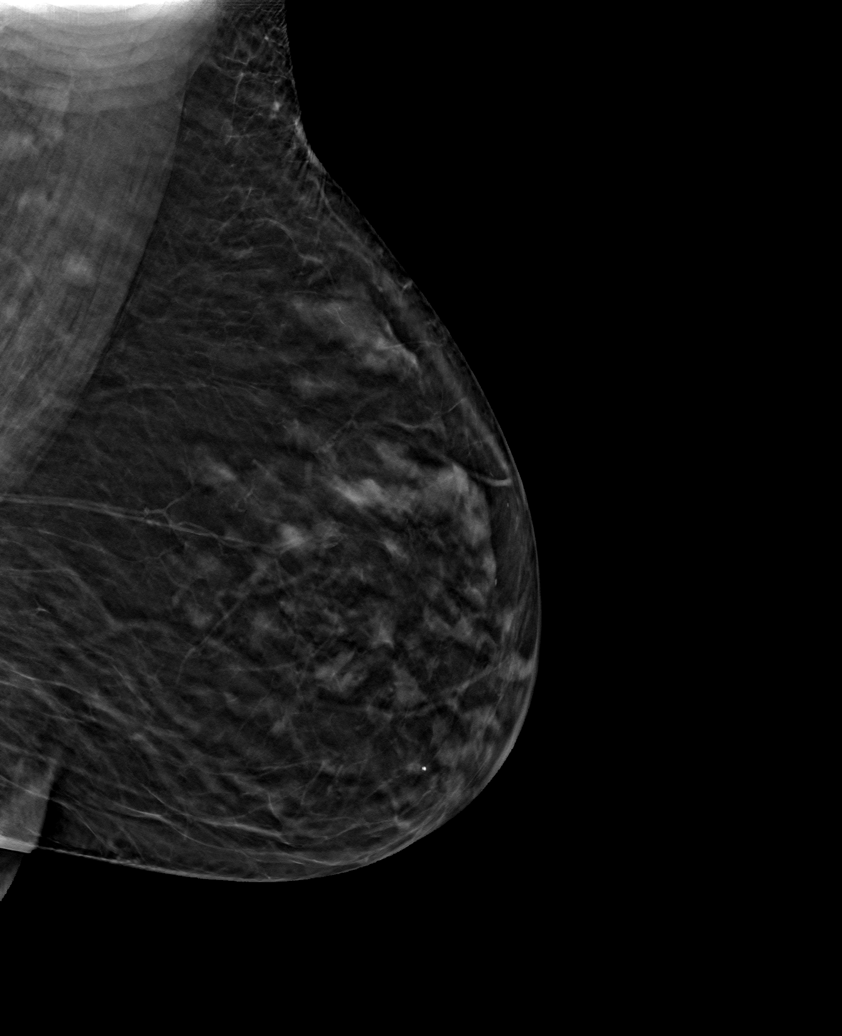

[6 of 30 positions shown; findings below may reference images not displayed]

ACR Breast Density Category b: There are scattered areas of
fibroglandular density.
FINDINGS: There are no findings suspicious for malignancy.
IMPRESSION: No mammographic evidence of malignancy. A result letter of this
screening mammogram will be mailed directly to the patient.

RECOMMENDATION:
Screening mammogram in one year. (Code:51-O-LD2)

BI-RADS CATEGORY  1: Negative.
